# Patient Record
Sex: Female | Born: 1955
Health system: Southern US, Community
[De-identification: ages and names within clinical notes are randomized; demographics above are authoritative.]

## PROBLEM LIST (undated history)

## (undated) DIAGNOSIS — G589 Mononeuropathy, unspecified: Secondary | ICD-10-CM

## (undated) DIAGNOSIS — K859 Acute pancreatitis without necrosis or infection, unspecified: Secondary | ICD-10-CM

## (undated) DIAGNOSIS — K469 Unspecified abdominal hernia without obstruction or gangrene: Secondary | ICD-10-CM

## (undated) DIAGNOSIS — R011 Cardiac murmur, unspecified: Secondary | ICD-10-CM

## (undated) DIAGNOSIS — I272 Pulmonary hypertension, unspecified: Secondary | ICD-10-CM

## (undated) DIAGNOSIS — M1990 Unspecified osteoarthritis, unspecified site: Secondary | ICD-10-CM

## (undated) DIAGNOSIS — R112 Nausea with vomiting, unspecified: Secondary | ICD-10-CM

## (undated) DIAGNOSIS — T8859XA Other complications of anesthesia, initial encounter: Secondary | ICD-10-CM

## (undated) DIAGNOSIS — G4730 Sleep apnea, unspecified: Secondary | ICD-10-CM

## (undated) DIAGNOSIS — J42 Unspecified chronic bronchitis: Secondary | ICD-10-CM

## (undated) DIAGNOSIS — IMO0001 Reserved for inherently not codable concepts without codable children: Secondary | ICD-10-CM

## (undated) DIAGNOSIS — I10 Essential (primary) hypertension: Secondary | ICD-10-CM

## (undated) DIAGNOSIS — D649 Anemia, unspecified: Secondary | ICD-10-CM

## (undated) DIAGNOSIS — S149XXA Injury of unspecified nerves of neck, initial encounter: Secondary | ICD-10-CM

## (undated) DIAGNOSIS — I00 Rheumatic fever without heart involvement: Secondary | ICD-10-CM

## (undated) DIAGNOSIS — K219 Gastro-esophageal reflux disease without esophagitis: Secondary | ICD-10-CM

## (undated) DIAGNOSIS — R51 Headache: Secondary | ICD-10-CM

## (undated) DIAGNOSIS — I2510 Atherosclerotic heart disease of native coronary artery without angina pectoris: Secondary | ICD-10-CM

## (undated) DIAGNOSIS — G629 Polyneuropathy, unspecified: Secondary | ICD-10-CM

## (undated) DIAGNOSIS — R569 Unspecified convulsions: Secondary | ICD-10-CM

## (undated) DIAGNOSIS — H409 Unspecified glaucoma: Secondary | ICD-10-CM

## (undated) DIAGNOSIS — K7460 Unspecified cirrhosis of liver: Secondary | ICD-10-CM

## (undated) DIAGNOSIS — R0602 Shortness of breath: Secondary | ICD-10-CM

## (undated) DIAGNOSIS — F32A Depression, unspecified: Secondary | ICD-10-CM

## (undated) DIAGNOSIS — F329 Major depressive disorder, single episode, unspecified: Secondary | ICD-10-CM

## (undated) DIAGNOSIS — M4800 Spinal stenosis, site unspecified: Secondary | ICD-10-CM

## (undated) DIAGNOSIS — G8929 Other chronic pain: Secondary | ICD-10-CM

## (undated) DIAGNOSIS — K589 Irritable bowel syndrome without diarrhea: Secondary | ICD-10-CM

## (undated) DIAGNOSIS — E119 Type 2 diabetes mellitus without complications: Secondary | ICD-10-CM

## (undated) DIAGNOSIS — J45909 Unspecified asthma, uncomplicated: Secondary | ICD-10-CM

## (undated) DIAGNOSIS — G4733 Obstructive sleep apnea (adult) (pediatric): Secondary | ICD-10-CM

## (undated) DIAGNOSIS — Z9889 Other specified postprocedural states: Secondary | ICD-10-CM

## (undated) DIAGNOSIS — I5032 Chronic diastolic (congestive) heart failure: Secondary | ICD-10-CM

## (undated) DIAGNOSIS — M549 Dorsalgia, unspecified: Secondary | ICD-10-CM

## (undated) DIAGNOSIS — Z794 Long term (current) use of insulin: Secondary | ICD-10-CM

## (undated) DIAGNOSIS — T4145XA Adverse effect of unspecified anesthetic, initial encounter: Secondary | ICD-10-CM

## (undated) DIAGNOSIS — E039 Hypothyroidism, unspecified: Secondary | ICD-10-CM

## (undated) DIAGNOSIS — K760 Fatty (change of) liver, not elsewhere classified: Secondary | ICD-10-CM

## (undated) DIAGNOSIS — Z8719 Personal history of other diseases of the digestive system: Secondary | ICD-10-CM

## (undated) DIAGNOSIS — J449 Chronic obstructive pulmonary disease, unspecified: Secondary | ICD-10-CM

## (undated) DIAGNOSIS — I639 Cerebral infarction, unspecified: Secondary | ICD-10-CM

## (undated) DIAGNOSIS — I509 Heart failure, unspecified: Secondary | ICD-10-CM

## (undated) DIAGNOSIS — J189 Pneumonia, unspecified organism: Secondary | ICD-10-CM

## (undated) DIAGNOSIS — M797 Fibromyalgia: Secondary | ICD-10-CM

## (undated) HISTORY — DX: Chronic diastolic (congestive) heart failure: I50.32

## (undated) HISTORY — PX: HEEL SPUR SURGERY: SHX665

## (undated) HISTORY — PX: TUBAL LIGATION: SHX77

## (undated) HISTORY — PX: BACK SURGERY: SHX140

## (undated) HISTORY — DX: Atherosclerotic heart disease of native coronary artery without angina pectoris: I25.10

## (undated) HISTORY — PX: BREAST CYST EXCISION: SHX579

## (undated) HISTORY — PX: SPHINCTEROTOMY: SHX5279

## (undated) HISTORY — PX: CARDIAC CATHETERIZATION: SHX172

## (undated) HISTORY — DX: Reserved for inherently not codable concepts without codable children: IMO0001

## (undated) HISTORY — PX: KNEE ARTHROSCOPY: SUR90

## (undated) HISTORY — PX: CYST EXCISION: SHX5701

## (undated) HISTORY — DX: Obstructive sleep apnea (adult) (pediatric): G47.33

## (undated) HISTORY — PX: BARIATRIC SURGERY: SHX1103

## (undated) HISTORY — PX: EYE SURGERY: SHX253

## (undated) HISTORY — PX: EXCISIONAL HEMORRHOIDECTOMY: SHX1541

## (undated) HISTORY — PX: HEMORRHOID SURGERY: SHX153

## (undated) HISTORY — DX: Type 2 diabetes mellitus without complications: E11.9

## (undated) HISTORY — DX: Gastro-esophageal reflux disease without esophagitis: K21.9

## (undated) HISTORY — PX: HERNIA REPAIR: SHX51

## (undated) HISTORY — PX: BILATERAL KNEE ARTHROSCOPY: SUR91

## (undated) HISTORY — PX: CATARACT EXTRACTION W/ INTRAOCULAR LENS  IMPLANT, BILATERAL: SHX1307

## (undated) HISTORY — PX: CHOLECYSTECTOMY: SHX55

## (undated) HISTORY — DX: Long term (current) use of insulin: Z79.4

## (undated) HISTORY — PX: ABDOMINAL HYSTERECTOMY: SHX81

## (undated) HISTORY — DX: Essential (primary) hypertension: I10

## (undated) HISTORY — DX: Morbid (severe) obesity due to excess calories: E66.01

## (undated) HISTORY — PX: APPENDECTOMY: SHX54

---
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
## 1979-11-23 HISTORY — PX: PARTIAL HYSTERECTOMY: SHX80

## 1990-11-22 HISTORY — PX: OTHER SURGICAL HISTORY: SHX169

## 1992-11-22 HISTORY — PX: CHOLECYSTECTOMY: SHX55

## 2001-11-22 HISTORY — PX: OTHER SURGICAL HISTORY: SHX169

## 2003-11-23 HISTORY — PX: OTHER SURGICAL HISTORY: SHX169

## 2007-04-22 ENCOUNTER — Encounter: Payer: Self-pay | Admitting: Cardiology

## 2007-04-26 ENCOUNTER — Ambulatory Visit: Payer: Self-pay | Admitting: Cardiology

## 2007-04-28 ENCOUNTER — Inpatient Hospital Stay (HOSPITAL_COMMUNITY): Admission: EM | Admit: 2007-04-28 | Discharge: 2007-04-29 | Payer: Self-pay | Admitting: Emergency Medicine

## 2007-04-28 ENCOUNTER — Ambulatory Visit: Payer: Self-pay | Admitting: Cardiology

## 2007-04-28 ENCOUNTER — Encounter: Payer: Self-pay | Admitting: Cardiology

## 2007-04-29 ENCOUNTER — Encounter: Payer: Self-pay | Admitting: Physician Assistant

## 2007-05-18 ENCOUNTER — Ambulatory Visit: Payer: Self-pay | Admitting: Cardiology

## 2007-12-06 ENCOUNTER — Ambulatory Visit (HOSPITAL_COMMUNITY): Admission: RE | Admit: 2007-12-06 | Discharge: 2007-12-06 | Payer: Self-pay | Admitting: Internal Medicine

## 2008-11-07 ENCOUNTER — Encounter: Payer: Self-pay | Admitting: Cardiology

## 2009-04-04 ENCOUNTER — Encounter: Payer: Self-pay | Admitting: Cardiology

## 2009-04-11 ENCOUNTER — Encounter: Payer: Self-pay | Admitting: Cardiology

## 2009-04-11 ENCOUNTER — Ambulatory Visit: Payer: Self-pay | Admitting: Cardiology

## 2009-04-18 ENCOUNTER — Ambulatory Visit: Payer: Self-pay | Admitting: Cardiology

## 2009-04-28 ENCOUNTER — Ambulatory Visit: Payer: Self-pay | Admitting: Cardiology

## 2009-06-23 DIAGNOSIS — E782 Mixed hyperlipidemia: Secondary | ICD-10-CM | POA: Insufficient documentation

## 2009-06-23 DIAGNOSIS — E663 Overweight: Secondary | ICD-10-CM | POA: Insufficient documentation

## 2009-06-23 DIAGNOSIS — I10 Essential (primary) hypertension: Secondary | ICD-10-CM | POA: Insufficient documentation

## 2009-06-23 DIAGNOSIS — R079 Chest pain, unspecified: Secondary | ICD-10-CM

## 2010-02-07 ENCOUNTER — Ambulatory Visit: Payer: Self-pay | Admitting: Cardiology

## 2010-06-14 ENCOUNTER — Ambulatory Visit: Payer: Self-pay | Admitting: Cardiology

## 2010-07-13 ENCOUNTER — Ambulatory Visit: Payer: Self-pay | Admitting: Cardiology

## 2010-07-15 ENCOUNTER — Telehealth (INDEPENDENT_AMBULATORY_CARE_PROVIDER_SITE_OTHER): Payer: Self-pay | Admitting: *Deleted

## 2010-08-12 ENCOUNTER — Ambulatory Visit: Payer: Self-pay | Admitting: Physician Assistant

## 2010-08-12 DIAGNOSIS — E119 Type 2 diabetes mellitus without complications: Secondary | ICD-10-CM

## 2010-08-12 DIAGNOSIS — I5032 Chronic diastolic (congestive) heart failure: Secondary | ICD-10-CM

## 2010-08-12 DIAGNOSIS — I2510 Atherosclerotic heart disease of native coronary artery without angina pectoris: Secondary | ICD-10-CM | POA: Insufficient documentation

## 2010-08-13 ENCOUNTER — Encounter: Payer: Self-pay | Admitting: Physician Assistant

## 2010-08-19 ENCOUNTER — Encounter: Payer: Self-pay | Admitting: Physician Assistant

## 2010-08-21 ENCOUNTER — Encounter: Payer: Self-pay | Admitting: Cardiology

## 2010-09-18 ENCOUNTER — Encounter (INDEPENDENT_AMBULATORY_CARE_PROVIDER_SITE_OTHER): Payer: Self-pay | Admitting: *Deleted

## 2010-09-22 ENCOUNTER — Encounter: Payer: Self-pay | Admitting: Cardiology

## 2010-09-26 ENCOUNTER — Encounter: Payer: Self-pay | Admitting: Physician Assistant

## 2010-10-20 ENCOUNTER — Ambulatory Visit: Payer: Self-pay | Admitting: Physician Assistant

## 2010-10-23 ENCOUNTER — Encounter (INDEPENDENT_AMBULATORY_CARE_PROVIDER_SITE_OTHER): Payer: Self-pay | Admitting: *Deleted

## 2010-10-28 ENCOUNTER — Encounter: Payer: Self-pay | Admitting: Physician Assistant

## 2010-10-30 ENCOUNTER — Encounter: Payer: Self-pay | Admitting: Physician Assistant

## 2010-11-04 ENCOUNTER — Encounter: Payer: Self-pay | Admitting: Cardiology

## 2010-11-06 ENCOUNTER — Encounter (INDEPENDENT_AMBULATORY_CARE_PROVIDER_SITE_OTHER): Payer: Self-pay | Admitting: *Deleted

## 2010-12-13 ENCOUNTER — Encounter: Payer: Self-pay | Admitting: Interventional Radiology

## 2010-12-14 ENCOUNTER — Ambulatory Visit: Admit: 2010-12-14 | Payer: Self-pay | Admitting: Cardiology

## 2010-12-17 ENCOUNTER — Telehealth (INDEPENDENT_AMBULATORY_CARE_PROVIDER_SITE_OTHER): Payer: Self-pay | Admitting: *Deleted

## 2010-12-23 NOTE — Letter (Signed)
Summary: Engineer, materials at [facility]  [address]   [place], [place] [address]   Phone: [phone]  Fax: [phone]        September 18, 2010 MRN: [id number]   [facility] [place] [address] [place], [place]  [address]   Dear Ms. Goering,  Your test ordered by Selena Batten has been reviewed by your physician (or physician assistant) and was found to be abnormal.  Please contact our office at your earliest convience to discuss your results.  I have left several messages for a return call, but have not received any to date.    ____ Echocardiogram  ____ Cardiac Stress Test  __X__ Lab Work  ____ Peripheral vascular study of arms, legs or neck  ____ CT scan or X-ray  ____ Lung or Breathing test  ____ Other:   Thank you.   Hoover Brunette, LPN    Duane Boston, M.D., F.A.C.C. Thressa Sheller, M.D., F.A.C.C. Oneal Grout, M.D., F.A.C.C. Cheree Ditto, M.D., F.A.C.C. Daiva Nakayama, M.D., F.A.C.C. Kenney Houseman, M.D., F.A.C.C. Jeanne Ivan, PA-C  Appended Document: Jonita Albee Results Letters Patient has left message in your voicemail and called again this am to say call her home phone first then cell. if not able to call today she will be at [facility] working.  She works in [employer]  [phone]

## 2010-12-23 NOTE — Cardiovascular Report (Signed)
Summary: Cardiac Catheterization  Cardiac Catheterization   Imported By: Dorise Hiss 08/11/2010 17:01:07  _____________________________________________________________________  External Attachment:    Type:   Image     Comment:   External Document

## 2010-12-23 NOTE — Assessment & Plan Note (Signed)
Summary: EPH -POST [facility] 8/23   Visit Type:  hospital follow-up Primary Provider:  Sherril Croon   History of Present Illness: Kimberly [name] presents for posthospital followup.  She was recently rehospitalized with chest pain syndrome, ruled out for MI, and had had a normal dobutamine stress echocardiogram, in July. Her symptoms were felt to be extremely atypical, but we added low-dose Imdur for treatment of probable microvascular disease. She was also placed on low dose furosemide, for treatment of diastolic heart failure.  Clinically, she feels somewhat better, with less exertional dyspnea.   Preventive Screening-Counseling & Management  Alcohol-Tobacco     Smoking Status: never  Current Medications (verified): 1)  Furosemide 20 Mg Tabs (Furosemide) .... Take 1 Tablet By Mouth Once A Day 2)  Levothyroxine Sodium 50 Mcg Tabs (Levothyroxine Sodium) .... Take 1 Tablet By Mouth Once A Day 3)  Losartan Potassium-Hctz 100-12.5 Mg Tabs (Losartan Potassium-Hctz) .... Take 1 Tablet By Mouth Once A Day 4)  Meloxicam 7.5 Mg Tabs (Meloxicam) .... Take 1 Tablet By Mouth Two Times A Day 5)  Glyburide-Metformin 5-500 Mg Tabs (Glyburide-Metformin) .... Take 1 Tablet By Mouth Two Times A Day 6)  Advair Diskus 250-50 Mcg/dose Aepb (Fluticasone-Salmeterol) .... One Inhalation Two Times A Day 7)  Protonix 40 Mg Solr (Pantoprazole Sodium) .... Take 1 Tablet By Mouth Two Times A Day 8)  Dicyclomine Hcl 10 Mg Caps (Dicyclomine Hcl) .... Take 1 Tablet By Mouth Four Times A Day 9)  Proair Hfa 108 (90 Base) Mcg/act Aers (Albuterol Sulfate) .... 2 Puffs Four Times A Day 10)  Novolog Flexpen 100 Unit/ml Soln (Insulin Aspart) .... Use As Directed 11)  Duoneb 0.5-2.5 (3) Mg/70ml Soln (Ipratropium-Albuterol) .... One Neb Tx Four Times A Day 12)  Carafate 1 Gm/26ml Susp (Sucralfate) .... 2 Tsp By Mouth Four Times A Day As Needed 13)  Isosorbide Mononitrate Cr 30 Mg Xr24h-Tab (Isosorbide Mononitrate) .... Take 1 Tablet By  Mouth Once A Day 14)  Vicodin 5-500 Mg Tabs (Hydrocodone-Acetaminophen) .... As Needed 15)  Cyclobenzaprine Hcl 10 Mg Tabs (Cyclobenzaprine Hcl) .... As Needed 16)  Cpap .... Use As Directed 17)  Oxygen 2l .... Use As Directed With Cpap  Allergies (verified): 1)  ! Sulfa 2)  ! Naprosyn 3)  ! * Anesthsia  Comments:  Nurse/Medical Assistant: The Kimberly [name]'s medication list and allergies were reviewed with the Kimberly [name] and were updated in the Medication and Allergy Lists.  Past History:  Past Medical History: CAD, nonobstructive by catheterization, 6/08 (false positive Cardiolite)...normal stress echo, 7/11 Chronic diastolic heart failure IDDM Hypertension Obstructive sleep apnea Morbid obesity GERD  Review of Systems       No fevers, chills, hemoptysis, dysphagia, melena, hematocheezia, hematuria, rash, claudication, orthopnea, or pnd. He complains of worsening lower extremity and pedal edema. All other systems negative.   Vital Signs:  Kimberly [name] profile:   55 year old female Height:      60 inches Weight:      241 pounds BMI:     47.24 Pulse rate:   76 / minute BP sitting:   100 / 67  (left arm) Cuff size:   large  Vitals Entered By: Carlye Grippe (August 12, 2010 1:40 PM)  Nutrition Counseling: Kimberly [name]'s BMI is greater than 25 and therefore counseled on weight management options.  Physical Exam  Additional Exam:  GEN: 55 year old female, obese, no distress HEENT: NCAT,PERRLA,EOMI NECK: palpable pulses, no bruits; no JVD; no TM LUNGS: CTA bilaterally HEART: RRR (S1S2); soft, grade 2/6 systolic  ejection murmur; no rubs; no gallops ABD: soft, NT; intact BS EXT:2-3+ bilateral, nonpitting edema SKIN: warm, dry MUSC: no obvious deformity NEURO: A/O (x3)     Impression & Recommendations:  Problem # 1:  CHRONIC DIASTOLIC HEART FAILURE (ICD-428.32)  increase Lasix to 40 mg daily. Check baseline metabolic profile/BNP level today, with repeat BMET in one week. add  supplemental potassium, if needed.  Problem # 2:  CAD (ICD-414.00)  no further evaluation indicated. Kimberly [name] has chest pain syndrome, with nonobstructive CAD by catheterization in 2008, following a false-positive stress test. She had a recent, normal dobutamine stress echocardiogram, in July. Will continue management of probable microvascular disease, with recent addition of isosorbide mononitrate.  Problem # 3:  HYPERLIPIDEMIA-MIXED (ICD-272.4)  Kimberly [name] should be on a statin. However, she had elevated LFTs in recent past, with reported diagnosis of pancreatitis. We'll recheck liver enzymes and, if normalized, add a statin. She will then need followup FLP/LFTs in 12 weeks.  Problem # 4:  IDDM (ICD-250.01) Assessment: Comment Only  Other Orders: T-Basic Metabolic Panel [id number]) T-Hepatic Function [id number]) T-Lipid Profile [id number]) T-Basic Metabolic Panel [id number]) T-BNP  (B Natriuretic Peptide) ([id number])  Kimberly [name] Instructions: 1)  Increase Lasix (furosemide) to 40mg  by mouth once daily. You may take 2 of your 20 mg tablets until gone. Then get new prescription filled for 40mg  tablets. 2)  Your physician recommends that you go to the [facility] for lab work: DO TODAY. ALSO DO FASTING LAB WORK IN 1 WEEK.  3)  Your physician wants you to follow-up in: 3 months. You will receive a reminder letter in the mail one-two months in advance. If you don't receive a letter, please call our office to schedule the follow-up appointment. Prescriptions: FUROSEMIDE 40 MG TABS (FUROSEMIDE) Take one tablet by mouth daily.  #30 x 6   Entered by:   Cyril Loosen, RN, BSN   Authorized by:   Nelida Meuse, PA-C   Signed by:   Cyril Loosen, RN, BSN on 08/12/2010   Method used:   Electronically to        [facility]* (retail)       [address]       [place], [place]  [address]       Ph: [phone]       Fax: [phone]   RxID:   [id number]  I  have reviewed and approved all prescriptions at the time of the office visit. Nelida Meuse, PA-C  August 12, 2010 2:33 PM

## 2010-12-23 NOTE — Consult Note (Signed)
Summary: CARDIOLOGY CONSULT/ [facility]  CARDIOLOGY CONSULT/ [facility]   Imported By: Zachary George 10/20/2010 10:47:32  _____________________________________________________________________  External Attachment:    Type:   Image     Comment:   External Document

## 2010-12-23 NOTE — Miscellaneous (Signed)
Summary: FLP,LFT IN 12 WEEKS  Clinical Lists Changes  Orders: Added new Test order of T-Hepatic Function [phone]) - Signed Added new Test order of T-Lipid Profile [phone]) - Signed

## 2010-12-23 NOTE — Letter (Signed)
Summary: Engineer, materials at [facility]  [address]   [place], [place] [address]   Phone: [phone]  Fax: [phone]        October 23, 2010 MRN: [id number]   [facility] [place] [address] [place], [place]  [address]   Dear Ms. Revell,  Your test ordered by Selena Batten has been reviewed by your physician (or physician assistant) and was found to be normal or stable. Your physician (or physician assistant) felt no changes were needed at this time.  ____ Echocardiogram  ____ Cardiac Stress Test  __X__ Lab Work  ____ Peripheral vascular study of arms, legs or neck  ____ CT scan or X-ray  ____ Lung or Breathing test  ____ Other:   Thank you.   Hoover Brunette, LPN    Duane Boston, M.D., F.A.C.C. Thressa Sheller, M.D., F.A.C.C. Oneal Grout, M.D., F.A.C.C. Cheree Ditto, M.D., F.A.C.C. Daiva Nakayama, M.D., F.A.C.C. Kenney Houseman, M.D., F.A.C.C. Jeanne Ivan, PA-C

## 2010-12-23 NOTE — Progress Notes (Signed)
Summary: rx for Lasix   Phone Note Call from Patient Call back at [phone]   Summary of Call: Was in hosp. over weekend.  Was started on Imdur and Lasix.  Was given rx for Imdur, but not the Lasix.  Stated Lasix was helping her SOB.  Now feeling SOB again and felt like that med was helping with pressure and SOB, feeling like fluid is building again. [facility])  Patient states that she has gained a couple of pounds.  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  Will be there between 6:30 -3:00 at ext. 2210.  Did check [employer] for dictation, but d/c summary not in yet and consult not clear on Lasix.  Initial call taken by: Hoover Brunette, LPN,  July 15, 2010 4:53 PM  Follow-up for Phone Call        OK to start 20mg  Lasix by mouth qdaily.  Follow-up by: Lewayne Bunting, MD, [facility],  July 16, 2010 1:06 PM  Additional Follow-up for Phone Call Additional follow up Details #1::        Patient notified.   Will send rx to [facility]. Hoover Brunette, LPN  July 16, 2010 4:33 PM     New/Updated Medications: FUROSEMIDE 20 MG TABS (FUROSEMIDE) Take 1 tablet by mouth once a day Prescriptions: FUROSEMIDE 20 MG TABS (FUROSEMIDE) Take 1 tablet by mouth once a day  #30 x 6   Entered by:   Hoover Brunette, LPN   Authorized by:   Lewayne Bunting, MD, [facility]   Signed by:   Hoover Brunette, LPN on 45/40/9811   Method used:   Electronically to        [facility]* (retail)       [address]       [place], [place]  [address]       Ph: [phone]       Fax: [phone]   RxID:   [id number]

## 2010-12-23 NOTE — Assessment & Plan Note (Signed)
Summary: eph d/c [facility] 09-28-2010   Visit Type:  Follow-up Primary Provider:  Sherril Croon   History of Present Illness: patient presents for post hospital followup, following recent brief stay here at [facility] for chest pain.  Serial cardiac markers were negative. Imdur was increased to 60 q.d., and low-dose K was added.   Clinically, she reports improvement in her chest discomfort, and attributes this to the increased dose of isosorbide. However, she is now complaining of swelling of her hands and feet, and a "smothering" sensation. Of note, she does have OSA, and is compliant with CPAP. She also complains of chronic fatigue, right leg cramps, and constant back pain.  Preventive Screening-Counseling & Management  Alcohol-Tobacco     Smoking Status: never  Current Medications (verified): 1)  Furosemide 40 Mg Tabs (Furosemide) .... Take One Tablet By Mouth Daily. 2)  Levothyroxine Sodium 75 Mcg Tabs (Levothyroxine Sodium) .... Take 1 Tablet By Mouth Once A Day 3)  Losartan Potassium-Hctz 100-12.5 Mg Tabs (Losartan Potassium-Hctz) .... Take 1 Tablet By Mouth Once A Day 4)  Meloxicam 7.5 Mg Tabs (Meloxicam) .... Take 1 Tablet By Mouth Two Times A Day 5)  Glyburide-Metformin 5-500 Mg Tabs (Glyburide-Metformin) .... Take 1 Tablet By Mouth Two Times A Day 6)  Advair Diskus 500-50 Mcg/dose Aepb (Fluticasone-Salmeterol) .... One Inhalation Two Times A Day 7)  Protonix 40 Mg Solr (Pantoprazole Sodium) .... Take 1 Tablet By Mouth Two Times A Day 8)  Xopenex Hfa 45 Mcg/act Aero (Levalbuterol Tartrate) .... 2 Puffs Four Times A  Day As Needed 9)  Novolog Flexpen 100 Unit/ml Soln (Insulin Aspart) .... Use As Directed 10)  Duoneb 0.5-2.5 (3) Mg/24ml Soln (Ipratropium-Albuterol) .... One Neb Tx Four Times A Day 11)  Isosorbide Mononitrate Cr 60 Mg Xr24h-Tab (Isosorbide Mononitrate) .... Take 1 Tablet By Mouth Once A Day 12)  Vicodin 5-500 Mg Tabs (Hydrocodone-Acetaminophen) .... As Needed 13)  Cpap .... Use As  Directed 14)  Oxygen 2l .... Use As Directed With Cpap 15)  Simvastatin 40 Mg Tabs (Simvastatin) .... Take 1 Tablet By Mouth Once A Day 16)  Aspir-Low 81 Mg Tbec (Aspirin) .... Take 1 Tablet By Mouth Once A Day 17)  Potassium Chloride Cr 10 Meq Cr-Caps (Potassium Chloride) .... Take 1 Tablet By Mouth Once A Day  Allergies (verified): 1)  ! Sulfa 2)  ! Naprosyn 3)  ! * Anesthsia  Comments:  Nurse/Medical Assistant: The patient's medication bottles and allergies were reviewed with the patient and were updated in the Medication and Allergy Lists.  Past History:  Past Medical History: Last updated: 08/12/2010 CAD, nonobstructive by catheterization, 6/08 (false positive Cardiolite)...normal stress echo, 7/11 Chronic diastolic heart failure IDDM Hypertension Obstructive sleep apnea Morbid obesity GERD  Review of Systems       No fevers, chills, hemoptysis, dysphagia, melena, hematocheezia, hematuria, rash, claudication, orthopnea, pnd, pedal edema. All other systems negative.   Vital Signs:  Patient profile:   55 year old female Height:      60 inches Weight:      246 pounds Pulse rate:   68 / minute BP sitting:   123 / 78  (left arm) Cuff size:   large  Vitals Entered By: Carlye Grippe (October 20, 2010 10:41 AM)  Physical Exam  Additional Exam:  GEN: 55 year old female, obese, no distress HEENT: NCAT,PERRLA,EOMI NECK: palpable pulses, no bruits; no JVD; no TM LUNGS: CTA bilaterally HEART: RRR (S1S2); soft, grade 2/6 systolic ejection murmur; no rubs; no gallops ABD: soft,  NT; intact BS EXT:2-3+ bilateral, nonpitting edema SKIN: warm, dry MUSC: no obvious deformity NEURO: A/O (x3)     Impression & Recommendations:  Problem # 1:  CHRONIC DIASTOLIC HEART FAILURE (ICD-428.32)  patient has gained 5 pounds since last OV. We'll increase furosemide to 40 b.i.d., and supplemental potassium to 10 b.i.d. Will check metabolic profile/BNP level today, and repeat BMET  in one week  Problem # 2:  CAD (ICD-414.00)  no further workup indicated. Recent hospitalization for chest pain syndrome, with negative markers, and a negative Cardiolite, 7/11. Continue treatment for possible microvascular angina, in context of diabetes mellitus  Problem # 3:  IDDM (ICD-250.01) Assessment: Comment Only  Her updated medication list for this problem includes:    Losartan Potassium-hctz 100-12.5 Mg Tabs (Losartan potassium-hctz) .Marland Kitchen... Take 1 tablet by mouth once a day    Glyburide-metformin 5-500 Mg Tabs (Glyburide-metformin) .Marland Kitchen... Take 1 tablet by mouth two times a day    Novolog Flexpen 100 Unit/ml Soln (Insulin aspart) ..... Use as directed    Aspir-low 81 Mg Tbec (Aspirin) .Marland Kitchen... Take 1 tablet by mouth once a day  Problem # 4:  HYPERLIPIDEMIA-MIXED (ICD-272.4)  recently started on simvastatin. We'll reassess lipid status with FLP next month  Her updated medication list for this problem includes:    Simvastatin 40 Mg Tabs (Simvastatin) .Marland Kitchen... Take 1 tablet by mouth once a day  Future Orders: T-Lipid Profile ([id number]) ... 11/23/2010 T-Hepatic Function [id number]) ... 11/23/2010  Other Orders: T-Basic Metabolic Panel [phone] [id number]) T-BNP  (B Natriuretic Peptide) [id number]) T-Basic Metabolic Panel [id number])  Patient Instructions: 1)  Follow up with Dr. Earnestine Leys on Monday, December 14, 2010 at 11am. 2)  increase Lasix (furosemide) to 40mg  by mouth two times a day. 3)  Increase potassium to two times a day. 4)  Do labwork today (BMET/BNP) and in 1 week (BMET). 5)  Your physician recommends that you go to the [facility] for a FASTING lipid profile and liver function labs:  Do not eat or drink after midnight. DO IN JANUARY 2012. TAKE ORDER WITH YOU. Prescriptions: POTASSIUM CHLORIDE CR 10 MEQ CR-CAPS (POTASSIUM CHLORIDE) Take 1 tablet by mouth two times a day  #60 x 6   Entered by:   Cyril Loosen, RN, BSN   Authorized by:   Nelida Meuse,  PA-C   Signed by:   Cyril Loosen, RN, BSN on 10/20/2010   Method used:   Electronically to        [facility]* (retail)       [address]       [place], [place]  [address]       Ph: [phone]       Fax: [phone]   RxID:   [id number] FUROSEMIDE 40 MG TABS (FUROSEMIDE) Take one tablet by mouth two times a day.  #60 x 6   Entered by:   Cyril Loosen, RN, BSN   Authorized by:   Nelida Meuse, PA-C   Signed by:   Cyril Loosen, RN, BSN on 10/20/2010   Method used:   Electronically to        [facility]* (retail)       [address]       [place], [place]  [address]       Ph: [phone]       Fax: [phone]   RxID:   [id number]  I  have reviewed and approved all prescriptions at the time of the office visit. Nelida Meuse, PA-C  October 20, 2010 11:12 AM

## 2010-12-23 NOTE — Miscellaneous (Signed)
Summary: Rehab Report/ CARDIAC REHAB PROGRESS REPORT  Rehab Report/ CARDIAC REHAB PROGRESS REPORT   Imported By: Bartholomew Boards 09/24/2010 09:22:40  _____________________________________________________________________  External Attachment:    Type:   Image     Comment:   External Document

## 2010-12-24 NOTE — Letter (Signed)
Summary: Risk analyst at [facility]. [address]   [place], [place] [address]   Phone: [phone]  Fax: [phone]        November 06, 2010 MRN: [id number]   [place], [place]  [address]   Dear Ms. Crace,  Your test ordered by Rande Lawman has been reviewed by your physician (or physician assistant) and was found to be normal or stable. Your physician (or physician assistant) felt no changes were needed at this time.  ____ Echocardiogram  ____ Cardiac Stress Test  __X__ Lab Work  ____ Peripheral vascular study of arms, legs or neck  ____ CT scan or X-ray  ____ Lung or Breathing test  ____ Other:   Thank you.   Lovina Reach, LPN    Bryon Lions, M.D., F.A.C.C. Maceo Pro, M.D., F.A.C.C. Cammy Copa, M.D., F.A.C.C. Vonda Antigua, M.D., F.A.C.C. Vita Barley, M.D., F.A.C.C. Mare Ferrari, M.D., F.A.C.C. Emi Belfast, PA-C

## 2010-12-24 NOTE — Miscellaneous (Signed)
Summary: Rehab Report/ CARDIAC REHAB PROGRESS REPORT  Rehab Report/ CARDIAC REHAB PROGRESS REPORT   Imported By: Bartholomew Boards 11/10/2010 15:57:07  _____________________________________________________________________  External Attachment:    Type:   Image     Comment:   External Document

## 2010-12-24 NOTE — Progress Notes (Signed)
  Phone Note Other Incoming   Request: Send information Summary of Call: Request for records received from [facility]. Request forwarded to [facility].     

## 2011-03-04 ENCOUNTER — Encounter: Payer: Self-pay | Admitting: *Deleted

## 2011-03-23 ENCOUNTER — Telehealth: Payer: Self-pay | Admitting: *Deleted

## 2011-03-23 NOTE — Telephone Encounter (Signed)
Received reminder letter in mail for FLP / LFT.  States recently in hospital in Feb. 2012.  No insurance and out of work.  Checked [facility] system & no lipid panel done at that time.

## 2011-04-06 NOTE — Assessment & Plan Note (Signed)
[place] OFFICE NOTE   Kimberly, [name]              MRN:          [id number]  DATE:04/28/2007                            DOB:          Mar 04, 1956    REFERRING PHYSICIAN:  Dr. Liane Comber   REASON FOR CONSULTATION:  Unstable angina.   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  The patient was admitted on  Saturday to [facility] with substernal chest pain.  The patient  stated that her pain started at 10 p.m. the night prior and lasted for  approximately an hour.  She also had some pain radiating into the right  neck area and left arm.  She did have some shortness of breath.  She was  ruled out for myocardial infarction, and she was given a Medrol dose  pack.  It was felt that she had chest wall pain.  She then called back  on Monday to Dr. Liane Comber.  Due to ongoing pain, she was referred  subsequently for a Cardiolite imaging study which was interpreted by me  which showed that the perfusion data were positive for ischemia with an  anterior apical defect.  Dr. Liane Comber called me yesterday, and I arranged  for the patient to be seen as soon as possible in the office this  morning.   The patient reports to me that she has had off and on pain since last  Saturday.  In particular, exertion causes her to have a tightness in the  chest which usually lasts a few minutes and abates when resting or  sitting down.  She did receive nitroglycerin from Dr. Liane Comber last night  and has actually taken the nitro on 2 occasions, each time with prompt  relief of her substernal chest pressure.  She states that she has  associated shortness of breath and diaphoresis and some radiation into  the left arm.  Just this morning walking through the parking lot to come  to our office, she actually had an episode of substernal chest pain  which then resolved  in the waiting area.  An EKG as of this dictation is  pending.   ALLERGIES:  1. FELDENE CAUSES NAUSEA, NAPROSYN CAUSES NAUSEA, NONSTEROIDAL ANTI-      INFLAMMATORY DRUGS CAUSE NAUSEA AND THROAT CLOSES.  2. SULFA YEAST INFECTION.  3. ZELNORM DIARRHEA.   FAMILY HISTORY:  Strong family history of coronary artery disease with a  brother with CAD at an early age.  Also type 2 diabetes mellitus in  family members.  Father died at age 27 from heart disease.  Mother also  had a myocardial infarction at the age of 4.   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  She  does not smoke or drink.   PAST MEDICAL HISTORY:  1. History of carpal tunnel syndrome.  2. History of esophageal reflux.  3. Hypertension.  4. Headache.  5. Hyperlipidemia.  6. Sleep apnea, documented and treated by Dr. Felton Clinton.  7. Insomnia.  8. Obesity.  9. History of cholecystectomy and abdominal  hysterectomy.  10.Hemorrhoidectomy.  11.History of colonoscopy in 2002 with followup in 2006.   REVIEW OF SYSTEMS:  As per HPI.  No nausea or vomiting, no fever or  chills, no melena, hematochezia.  No dysuria or frequency.  No  palpitations or syncope.  Positive for reflux.  Positive for chest pain  as outlined above.  Remainder of review of systems is noncontributory.   PHYSICAL EXAMINATION:  VITAL SIGNS:  Blood pressure is 144/87; heart  rate is 48 beats per minute, weight is 215 pounds.  GENERAL:  Obese white female but in no apparent distress.  HEENT:  Normal.  NECK:  Normal.  Carotid upstroke and no carotid bruits.  JVD is 6 cm.  LUNGS:  Clear bilaterally.  HEART:  Regular rate and rhythm with normal S1 and S2 and no murmur,  rubs, or gallop.  ABDOMEN:  Soft, nontender, no rebound or guarding.  Good bowel sounds.  EXTREMITIES:  No cyanosis, clubbing, or edema.  NEUROLOGIC:  Patient alert, oriented, and grossly nonfocal.   PROBLEM LIST:  1. Unstable angina.  2. Abnormal Cardiolite study  with anterior ischemia.  3. Preserved ejection fraction.  4. Multiple cardiac risk factors.  5. Multiple allergies.  See details above.   PLAN:  1. The patient clearly has symptoms of unstable angina.  Her pain is      responsive to nitroglycerin.  She is a high risk patient with a      positive Cardiolite study.  2. I have told the patient that I cannot send her home from the office      and that she needs to be admitted to [facility] and needs      to be scheduled as soon as possible for a cardiac catheterization.      We will admit the patient, place her on aspirin and Plavix as well      as Lovenox.  We will hold off on beta blocker due to her low      resting and baseline heart rate.  She will get nitro paste and      p.r.n. nitroglycerin.  3. I have discussed at length with the patient her need to be      hospitalized, and she is in agreement, and she will be transferred      per EMS this morning.     Ernestine Mcmurray, MD,FACC  Electronically Signed    GED/MedQ  DD: 04/28/2007  DT: 04/28/2007  Job #: [id number]   cc:   Carin Primrose

## 2011-04-06 NOTE — Assessment & Plan Note (Signed)
[place]                          [facility] OFFICE NOTE   NAME:Kimberly [name], Kimberly [name]                    MRN:          [id number]  DATE:04/28/2009                            DOB:          1956-03-18    PRIMARY CARDIOLOGIST:  Ernestine Mcmurray, MD,FACC   REASON FOR VISIT:  Scheduled followup.   Kimberly [name] returns to our clinic in followup of recent diagnostic  studies, ordered by Dr. Domenic Polite, for further evaluation of shortness of  breath and chest discomfort.   Kimberly [name] was previously seen in our clinic in 2008, at which time she  was referred for a cardiac catheterization, by Dr. Dannielle Burn, for further  evaluation of chest pain in the setting of multiple cardiac risk  factors.  Moreover, she had had an abnormal Cardiolite suggestive of  anterior ischemia.  Cardiac catheterization, however, suggested that the  stress test was a false positive, indicating no significant CAD and  preserved LVF (EF 55%), with no focal wall motion abnormalities.   The current studies were a dobutamine stress Cardiolite, which induced a  90% PMHR, and during which the patient did experience chest pain.  Perfusion imaging, however, indicated no large reversible defect  suggestive of ischemia; EF 70% and normal TID ratio of 0.95.   A 2-D echocardiogram was also performed, indicating normal LVF (EF 65%)  with mild/moderate concentric LVH.  No significant valvular  abnormalities.   Kimberly [name] complains of chest pain which is very atypical, essentially  occurring on a daily basis, and exacerbated both by palpation and  inspiration.  She states that these symptoms have been ongoing for the  past year.   CURRENT MEDICATIONS:  Unchanged from recent visit.   CURRENT MEDICATIONS:  1. Levothyroxine 0.05 mg daily.  2. Amlodipine 10 mg daily.  3. Meloxicam 15 mg daily.  4. Zocor 20 mg daily.  5. Amitriptyline 25 mg daily.  6. Metformin 500 mg t.i.d.  7. Citalopram 20  mg daily.  8. Metoprolol 25 mg b.i.d.  9. Furosemide 20 mg daily.  10.Advair 1 puff b.i.d.   PHYSICAL EXAMINATION:  VITAL SIGNS:  Blood pressure 144/84, pulse 71,  regular, weight 232.  GENERAL:  A 55 year old female, obese, sitting upright, in no distress.  HEENT:  Normocephalic, atraumatic.  NECK:  Palpable carotid pulse without bruits.  LUNGS:  Clear to auscultation in all fields.  HEART:  Regular rate and rhythm.  No significant murmurs.  ABDOMEN:  Soft, nontender.  EXTREMITIES:  No significant edema.  NEURO:  Mildly anxious, but otherwise alert and oriented.   IMPRESSION:  1. Noncardiac chest pain.      a.     Suspect musculoskeletal etiology.      b.     Normal dobutamine stress Cardiolite.      c.     Nonobstructive CAD by cardiac catheterization, June 2008.      d.     False positive stress Cardiolite.  2. Normal LVF.  3. GERD.  4. Type 2 diabetes mellitus.  5. Dyslipidemia.  6. Obesity.  7. Hypertension.   PLAN:  1. No  further cardiac workup is indicated at this point in time.  The      patient presents with symptoms which are quite atypical and, in      fact, are reproducible by physical examination.  This most likely      is consistent with a musculoskeletal etiology, and we will defer to      Dr. Bobby Rumpf regarding further management.  2. Continue aggressive risk factor modification, including aggressive      lipid management with target LDL of 70 or less, if feasible.  The      patient does have type 2 diabetes mellitus and therefore has CAD      equivalent.  However, no further cardiac workup is indicated at      this point in time, and we will have the patient return to follow      up with Dr. Dannielle Burn on an as-needed basis.      Gene Serpe, PA-C  Electronically Signed      Ernestine Mcmurray, MD,FACC  Electronically Signed   GS/MedQ  DD: 04/28/2009  DT: 04/29/2009  Job #: [id number]   cc:   Celedonio Savage, MD

## 2011-04-06 NOTE — Cardiovascular Report (Signed)
NAMEDALEY, MOORADIAN             ACCOUNT NO.:  [id number]   MEDICAL RECORD NO.:  [id number]          PATIENT TYPE:  INP   LOCATION:  2807                         FACILITY:  [place]   PHYSICIAN:  Satira Sark, MD DATE OF BIRTH:  1956-09-13   DATE OF PROCEDURE:  04/28/2007  DATE OF DISCHARGE:                            CARDIAC CATHETERIZATION   PRIMARY CARE PHYSICIAN:  Dr. Carin Primrose.   INDICATIONS:  Kimberly [name] is a pleasant 55 year old woman with a  history of hypertension, gastroesophageal reflux disease on proton pump  inhibitor, hyperlipidemia, obstructive sleep apnea, and recent chest  discomfort.  She was observed at [facility] and ruled  out for myocardial infarction recently, undergoing a Cardiolite stress  test on June 4 indicating evidence of small to moderate anteroapical  ischemia with an ejection fraction of 62%.  She was seen in the office  earlier today by Dr. Dannielle Burn and is now transferred to [facility] for a diagnostic cardiac catheterization to clearly assess the  coronary anatomy.  The potential risks and benefits were explained to  her in advance and informed consent was obtained.   PROCEDURES PERFORMED:  1. Left heart catheterization.  2. Selective coronary angiography.  3. Ventriculography.   ACCESS AND EQUIPMENT:  The area about the right femoral artery was  anesthetized with 1% lidocaine and a 6-French sheath was placed in the  right femoral artery via modified Seldinger technique.  Standard  preformed 6-French JL-4 and JR-4 catheters were used for selective  coronary angiography and an angled pigtail catheter was used for left  heart catheterization and left ventriculography.  A total of 60 mL  Omnipaque were used.  All exchanges were made over wire.  The patient  tolerated the procedure well without any complications.   HEMODYNAMIC RESULTS:  Aorta 141/77 mmHg.  Left ventricle 141/16 mmHg.   ANGIOGRAPHIC FINDINGS:  1.  Left main coronary artery is medium in caliber and gives rise to      left anterior descending, ramus intermedius and circumflex vessels.      There is no significant flow-limiting coronary atherosclerosis      noted.  2. The left anterior descending is a medium caliber vessel that tapers      towards the apex.  This vessel gives rise to a fairly large      proximal diagonal branch and a smaller distal diagonal branch.      Flow was TIMI III throughout the vessel and is no significant flow-      limiting coronary atherosclerosis noted.  3. There is a small to medium caliber ramus intermedius without      significant flow-limiting coronary atherosclerosis.  4. The circumflex coronary artery is a medium to large caliber vessel      with four obtuse marginal branches, the third of which is the      largest and bifurcates.  There are minor luminal irregularities      without flow-limiting coronary atherosclerosis.  5. The right coronary artery is a medium caliber dominant vessel.      There are minor luminal irregularities without  significant flow-      limiting coronary sclerosis.  6. Left ventriculography was performed in the RAO projection and      revealed ejection fraction of approximately 55% without significant      wall motion abnormality or significant mitral regurgitation.   DIAGNOSES:  1. No significant flow-limiting coronary atherosclerosis noted within      major epicardial vessels.  There is some distal tapering of the      left anterior descending, although flow is TIMI III.  2. Left ventricular ejection fraction approximately 55% without      significant wall motion abnormality.  No significant mitral      regurgitation.  The left ventricle end-diastolic pressure of 16      mmHg.   DISCUSSION:  I reviewed results with the patient and with Dr. Dannielle Burn by  phone.  At this point will plan overnight observation.  Will follow up a  full set of cardiac markers as well as her  electrocardiogram.  Anticipate a contrasted CT scan of the chest tomorrow to exclude  pulmonary embolus, dissection and mass.  Continue proton pump inhibitor.  If her CT scan of the chest is reassuring, may be able to continue  medical therapy with plans for risk factor modification.  One wonders  about the possibility of microvascular angina.  Further plans to follow.      Satira Sark, MD  Electronically Signed     SGM/MEDQ  D:  04/28/2007  T:  04/29/2007  Job:  [id number]   cc:   Ernestine Mcmurray, MD,FACC  Carin Primrose

## 2011-04-06 NOTE — Discharge Summary (Signed)
Kimberly [name], Kimberly [name]             ACCOUNT NO.:  [id number]   MEDICAL RECORD NO.:  [id number]          PATIENT TYPE:  INP   LOCATION:  4705                         FACILITY:  [place]   PHYSICIAN:  Wallis Bamberg. Johnsie Cancel, MD, FACCDATE OF BIRTH:  09-03-1956   DATE OF ADMISSION:  04/28/2007  DATE OF DISCHARGE:  04/29/2007                               DISCHARGE SUMMARY   PRIMARY CARE PHYSICIAN:  Dr. Danise Mina.   CARDIOLOGIST:  Dr. Dannielle Burn.   DISCHARGING DIAGNOSES:  1. Chest pain, negative cardiac enzymes status post cardiac      catheterization this admission, showing no significant flow-      limiting coronary atherosclerosis within major epicardial vessels,      some distal tapering of LAD, EF approximately 55%.  2. Status post chest CT showing no aortic dissection, mild      cardiomegaly, fatty liver, a T12 wedge compression with minimal      endplate retropulsion of indeterminate stage, interpreted by Dr.      Bartholome Bill.   PAST MEDICAL HISTORY:  Includes:  1. Chest pain status post stress Myoview at [facility] prior to      this admission, interpreted to show a perfusion data positive for      ischemia with a anterior septal defect.  2. History of carpal tunnel syndrome.  3. History of GERD.  4. Hypertension.  5. Headaches.  6. Hyperlipidemia.  7. Obstructive sleep apnea.  8. Insomnia.  9. Obesity.  10.Status post cholecystectomy, abdominal hysterectomy and      hemorrhoidectomy.   HOSPITAL COURSE:  [name] is a 55 year old female who apparently  works in the dietary department at [employer], who was followed  by Dr. Liane Comber.  She was admitted to [facility] on the past  Saturday for substernal chest discomfort.  Her cardiac enzymes were  negative.  It was felt to be chest wall pain.  The patient was  discharged home.  She then called Dr. Liane Comber on Monday, complaining of  ongoing pain.  She was for referred for a stress Myoview, which she  underwent and was  read by Dr. Dannielle Burn.  Stress test was felt to be  positive for ischemia.  The patient was seen in our office on June 6 in  [place], with recommendations to proceed with cardiac catheterization, as  patient was having ongoing chest pain.  The patient was transferred to  [facility] for cardiac catheterization,  underwent cardiac  catheterization on April 28, 2007 by Dr. Johnny Bridge, results as  stated above.  He felt the patient needed further evaluation in the form  of chest CT.  He wondered about the possibility of microvascular angina,  recommended continuing her protein proton pump inhibitor.  Also, the  patient seen by Dr. Johnsie Cancel on the day of discharge, denied any pain at  that time, complained of some tingling in the left foot the previous  evening.  Cath site stable.  CT of the chest results as stated above.  Patient being discharged home with instructions to follow up with Dr.  Dannielle Burn in [place], within the next 2  weeks.  I have called the office and  left a message for them to arrange appointment.  The patient has been  given the post-cardiac catheterization discharge  instructions.  She is to continue her previous medications including  Protonix 40 mg b.i.d., Reglan 10 mg q.i.d., lisinopril/HCTZ 20/12.5 mg  daily, atenolol 25 mg daily, aspirin 81 mg b.i.d.   Duration of discharge encounter less than 30 minutes.      Rosanne Sack, ACNP      Wallis Bamberg. Johnsie Cancel, MD, [facility]  Electronically Signed    MB/MEDQ  D:  04/29/2007  T:  04/29/2007  Job:  [id number]   cc:   Dr. Liane Comber

## 2011-04-06 NOTE — Assessment & Plan Note (Signed)
[name]                          [facility] OFFICE NOTE   [name], [name]                    MRN:          [id number]  DATE:04/11/2009                            DOB:          1956-11-05    PRIMARY CARE PHYSICIAN:  Celedonio Savage, MD   REASON FOR VISIT:  Shortness of breath and chest discomfort.   HISTORY OF PRESENT ILLNESS:  [name] was last seen in the office by  Dr. Dannielle Burn in 2008.  She presented at that time with chest pain and  shortness of breath.  Cardiac catheterization was done in June 2008,  which was reassuring, demonstrating no significant flow-limiting  coronary artery disease with only minor luminal irregularities and a  normal left ventricular ejection fraction of 55%.  She does have ongoing  cardiac risk factors including hypertension, hyperlipidemia, and  diabetes mellitus with reportedly difficult to control glucose.  She  reports that over the last few weeks she has had episodes of squeezing  in her upper chest as well as relative shortness of breath and wheezing,  sometimes at night associated with a cough that has been nonproductive.  She has had no fevers or chills.  No hemoptysis and no reproducible  exertional chest pain.  Her electrocardiogram today in the office shows  sinus rhythm with nonspecific ST-T wave changes.   In reviewing her chart, I also note a history of obstructive sleep apnea  as well as previous pulmonary function testing by Dr. Koleen Nimrod,  originally in January demonstrating 64% predicted FVC and 64% predicted  FEV-1, with followup studies on medical therapy showing significant  improvement with FVC of 82% predicted and FEV-1 of 94% predicted as of  March.  Back in January, she underwent an arthroscopic knee surgery and  was noted to have chest heaviness in the PACU as well as hypoxia.  She  was admitted for observation and ruled out for myocardial infarction.  She was diagnosed with asthma  at that time and has been on a combination  of Advair and albuterol from that point.  More recently, she was  referred for chest x-ray by Dr. Wenda Overland on Apr 04, 2009, demonstrating  cardiomegaly without pulmonary edema and clear lung fields.  She is  referred to [place] today to discuss the situation further.   ALLERGIES:  Intolerance to NONSTEROIDAL ANTIINFLAMMATORY DRUGS, SULFA  DRUGS, and LATEX.  ZELNORM causes diarrhea.  FELDENE causes nausea.  NAPROSYN causes nausea.   PAST MEDICAL HISTORY:  As outlined above.  She is status post  cholecystectomy, abdominal hysterectomy, and hemorrhoidectomy in the  past.  She also reports problems with chronic esophageal reflux.  In  January, she had a meniscal tear involving the left knee.   FAMILY HISTORY:  Significant for cardiovascular disease.  The patient's  father died at age 67 with heart disease and her mother had a myocardial  infarction at the age of 12.  She also has a brother with cardiovascular  disease at early age.   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  She is  not smoking.   REVIEW OF SYSTEMS:  Outlined above.  Otherwise, reviewed and negative.   PHYSICAL EXAMINATION:  VITAL SIGNS:  Blood pressure 133/85, heart rate  is 72, and weight is 230 pounds.  GENERAL:  This is as an overweight woman in no acute distress.  HEENT:  Conjunctivae are normal.  Oropharynx is clear.  NECK:  Supple.  No elevated jugular venous pressure.  No loud bruits or  thyromegaly.  LUNGS:  Generally clear with diminished breath sounds.  No active  wheezing.  CARDIAC:  Regular rate and rhythm.  No S3, gallop, or pericardial rub.  No pathologic systolic murmur.  PMI is indistinct.  ABDOMEN:  Soft and  nontender.  Bowel sounds present.  Unable to palpate liver edge.  EXTREMITIES:  No frank pitting edema.  Distal pulses are 1-2+.  SKIN:  Warm and dry.  MUSCULOSKELETAL:  No kyphosis noted.   NEUROPSYCHIATRIC:  The patient is alert and oriented x3.  Affect is  appropriate.   IMPRESSION AND RECOMMENDATIONS:  Recent episodes of chest discomfort and  shortness of breath in the setting of asthma diagnosis made back in  January, and also several cardiac risk factors.  She does report some  improvement with albuterol, although not complete resolution.  She has  also had some nocturnal coughing, nonproductive, and not associated with  any fevers or chills.  Her electrocardiogram is nonspecific.  She has a  history of prior presentations with chest pain, shortness of breath, and  underwent a cardiac catheterization in 2008, which demonstrated minimal  luminal irregularities without any obstructive coronary artery disease.  Cardiac risk factor profile includes significant family history,  obesity, diabetes mellitus, hypertension, and hyperlipidemia.  Recent  chest x-ray suggests cardiomegaly.  We have reviewed these issues and  will plan a followup 2-D echocardiogram to clearly assess cardiac size  and function, and also plan a followup dobutamine Cardiolite to reassess  for ischemia.  If these studies are reassuring, it would suggest perhaps  a pulmonary etiology to her presentation, although breakthrough reflux  symptoms may also be exacerbating the situation.  We will plan to bring  her back to the office over the next few weeks to discuss the results.  No specific medication changes were made today, otherwise.     Satira Sark, MD  Electronically Signed    SGM/MedQ  DD: 04/11/2009  DT: 04/12/2009  Job #: [id number]   cc:   Celedonio Savage, MD

## 2011-04-06 NOTE — Assessment & Plan Note (Signed)
[facility]                          [facility] OFFICE NOTE   Kimberly, [name]              MRN:          [id number]  DATE:05/18/2007                            DOB:          Feb 05, 1956    [facility]ICE NOTE   HISTORY OF PRESENT ILLNESS:  The patient is a 55 year old female with a  history of substernal chest pain.  The patient had an abnormal  Cardiolite study and underwent cardiac catheterization.  However, there  was no definite evidence of flow-limiting disease as documented by Dr.  Domenic Polite.  The patient was kept overnight and a CT scan was performed to  rule out pulmonary emboli.  This was negative.  The patient was found to  have a mild T12 wedge compression deformity with minimal retropulsion.  The patient does relate chronic pain at the level of T12.  From a  cardiac catheterization perspective, she has had no complications and is  stable.  She reports no swelling, hematoma, or pain in the right groin.   MEDICATIONS:  1. Protonix 40 mg p.o. b.i.d.  2. Reglan 10 mg p.o. q.i.d.  3. Lisinopril/hydrochlorothiazide 20/12.5 daily.  4. Medrol Dosepak has been discontinued.  5. Atenolol 25 mg a day.  6. Aspirin 81 mg p.o. b.i.d.   PHYSICAL EXAMINATION:  VITAL SIGNS:  Blood pressure 147/79, heart rate  61 beats per minute, weight 222 pounds.  An obese white female in no apparent distress.  NECK:  Normal carotid upstroke.  No carotid bruits.  LUNGS:  Clear breath sounds bilaterally.  HEART:  Regular rate and rhythm.  Normal S1, S2.  No murmurs, rubs, or  gallops.  ABDOMEN:  Soft and nontender.  No rebound or guarding.  Good bowel  sounds.  EXTREMITIES:  No cyanosis, clubbing, or edema.  NEURO:  The patient is alert and oriented, grossly nonfocal.   PROBLEM LIST:  1. Substernal chest pain, resolved.  2. Abnormal Cardiolite stress study, false positive with negative      catheterization.  3. Hypertension,  controlled.  4. T12 wedge compression fracture.   PLAN:  1. The patient will follow up with Dr. Woody Seller regarding the T12 wedge      compression fracture.  Also giving her the opportunity to call the      orthopedist, but she states that she will wait until she sees Dr.      Woody Seller first.  2. The patient can continue current medical therapy for hypertension.      There is no evidence of significant  coronary artery disease.  The      patient can be seen on a p.r.n. basis.     Ernestine Mcmurray, MD,FACC  Electronically Signed    GED/MedQ  DD: 05/18/2007  DT: 05/18/2007  Job #: [id number]   cc:   [name]

## 2011-04-16 ENCOUNTER — Encounter: Payer: Self-pay | Admitting: *Deleted

## 2011-04-16 ENCOUNTER — Telehealth: Payer: Self-pay | Admitting: *Deleted

## 2011-04-16 NOTE — Telephone Encounter (Signed)
Certified letter mailed to pt regarding labs that were due in January.

## 2011-04-23 ENCOUNTER — Telehealth: Payer: Self-pay | Admitting: *Deleted

## 2011-04-23 NOTE — Telephone Encounter (Signed)
Pt received certified letter regarding labs. She states she no longer has insurance and cannot afford to do labs. She states she knows she needs an appt also but is not sure what to do w/o insurance. Pt instructed to p/u copy of [facility] assistance form and complete asap. Pt should return to our office. If she is approved for assistance, she can schedule office visit (though she is aware we will not refuse to see her d/t payment problems) and possibly have labs done at [facility] or another [facility] owned facility. Pt verbalized understanding.

## 2011-05-27 ENCOUNTER — Encounter: Payer: Self-pay | Admitting: Physician Assistant

## 2011-06-15 ENCOUNTER — Other Ambulatory Visit: Payer: Self-pay | Admitting: Physician Assistant

## 2011-08-03 DIAGNOSIS — R079 Chest pain, unspecified: Secondary | ICD-10-CM

## 2011-08-10 ENCOUNTER — Telehealth: Payer: Self-pay | Admitting: *Deleted

## 2011-08-10 NOTE — Telephone Encounter (Signed)
D/C from [facility] on Wednesday, 9/12.  Was there for fluid around lungs and heart.  Also, staph infection.  Feeling better than before, but still having pressure  & heaviness in chest.  Feeling smothery - notices more after eating & at bedtime.  Is on Protonix 12m BID.  Also, on Lasix 480mdaily & Imdur 60101maily.  Dizziness a little more.  No weight gain that she can tell.  Hands & feet are puffy though.  Offered earlier OV with Dr. AriFletcher Anonr 10/4.  Patient declined another provider.  Has OV scheduled for 10/17.

## 2011-08-12 NOTE — Telephone Encounter (Signed)
I would add her on for next week at some point.

## 2011-08-19 NOTE — Telephone Encounter (Signed)
Spoke with patient.  Already has OV scheduled for tomorrow with GD.

## 2011-08-20 ENCOUNTER — Encounter: Payer: Self-pay | Admitting: Cardiology

## 2011-08-20 ENCOUNTER — Ambulatory Visit (INDEPENDENT_AMBULATORY_CARE_PROVIDER_SITE_OTHER): Payer: Self-pay | Admitting: Cardiology

## 2011-08-20 VITALS — BP 133/83 | HR 62 | Ht 60.0 in | Wt 242.0 lb

## 2011-08-20 DIAGNOSIS — E785 Hyperlipidemia, unspecified: Secondary | ICD-10-CM

## 2011-08-20 DIAGNOSIS — M4800 Spinal stenosis, site unspecified: Secondary | ICD-10-CM

## 2011-08-20 DIAGNOSIS — G4733 Obstructive sleep apnea (adult) (pediatric): Secondary | ICD-10-CM

## 2011-08-20 DIAGNOSIS — R079 Chest pain, unspecified: Secondary | ICD-10-CM

## 2011-08-20 DIAGNOSIS — I5032 Chronic diastolic (congestive) heart failure: Secondary | ICD-10-CM

## 2011-08-20 DIAGNOSIS — I10 Essential (primary) hypertension: Secondary | ICD-10-CM

## 2011-08-20 MED ORDER — GABAPENTIN 300 MG PO CAPS: 300.0000 mg | ORAL_CAPSULE | Freq: Three times a day (TID) | ORAL | Status: DC

## 2011-08-20 MED ORDER — NITROGLYCERIN 0.4 MG SL SUBL: 0.4000 mg | SUBLINGUAL_TABLET | SUBLINGUAL | Status: DC | PRN

## 2011-08-20 NOTE — Patient Instructions (Signed)
   Stop Mobic  Neurontin 300mg  three times a day   Raise bed 5%  Nitroglycerin as needed for severe chest pain Your physician wants you to follow up in: 6 months.  You will receive a reminder letter in the mail one-two months in advance.  If you don't receive a letter, please call our office to schedule the follow up appointment

## 2011-08-23 DIAGNOSIS — G4733 Obstructive sleep apnea (adult) (pediatric): Secondary | ICD-10-CM | POA: Insufficient documentation

## 2011-08-23 DIAGNOSIS — M4800 Spinal stenosis, site unspecified: Secondary | ICD-10-CM | POA: Insufficient documentation

## 2011-08-23 NOTE — Assessment & Plan Note (Signed)
See below

## 2011-08-23 NOTE — Assessment & Plan Note (Addendum)
Some element of right-sided failure with obstructive sleep apnea and dependent lower extremity edema. Continue Lasix. I asked the patient to curtail her salt intake and also discontinue Mobic.

## 2011-08-23 NOTE — Assessment & Plan Note (Signed)
Patient previously was on gabapentin but restarted this as this appeared to give some relief to the patient

## 2011-08-23 NOTE — Assessment & Plan Note (Signed)
Continue risk factor modification

## 2011-08-23 NOTE — Progress Notes (Signed)
History of present illness:  The patient is a 55 year old female history of recurrent chest pain and nonobstructive coronary artery disease by catheterization 2008. She was recently seen in the hospital and ruled out for myocardial infarction and was felt to have significant esophageal reflux disease with possible esophageal spasm. She does have multiple risk factors including hypertension, type 2 diabetes or family history of premature coronary artery disease. The patient presents for followup. She states that she still has substernal chest pain but mainly occurs at night when laying down. She describes a sharp pain in her lower sternal area. She does report that there is relief with nitroglycerin. Unfortunately she is currently still taking Mobic. She also has obstructive sleep apnea and is on CPAP and oxygen at night. She also reports symptoms consistent with spinal stenosis and no significant lower extremity pain. Just prior history of pancreatitis. She also has 2+ peripheral pitting edema. Patient had a stress echocardiogram done which was within normal limits on 08/04/2011  Allergies-family history and social history: As documented in the chart and reviewed  Medications: Documented in chart and reviewed  Past medical history: See problem list below and also documented in the chart  Review of systems: Patient reports lower extremity pain but no claudication. Positive for nausea but no vomiting. Lower extremity edema. Nocturnal cramping. Decreased exercise tolerance. No melena or hematochezia.  Physical examination: Vital signs listed below General: Overweight white female but in no distress HEENT: Oropharynx clear, neck supple, normal carotid upstroke no carotid bruits. No thyromegaly nonnodular thyroid Lungs: Clear breath sounds bilaterally no wheezing Heart: Regular rate and rhythm normal S1-S2 no murmurs or gallops Abdomen: Soft nontender no rebound or guarding Extremity exam: No  cyanosis or clubbing there is 2+ peripheral pitting edema Neuro: Patient is alert and oriented grossly nonfocal.  Psychiatric: Normal affect

## 2011-08-23 NOTE — Assessment & Plan Note (Signed)
Noncardiac chest pain likely GI in nature. Asked the patient to stop Mobic and referred her to GI for further evaluation. She can take when necessary nitroglycerin as this may help with relieving esophageal spasm.

## 2011-09-08 ENCOUNTER — Encounter: Payer: Self-pay | Admitting: Cardiology

## 2011-09-09 LAB — BASIC METABOLIC PANEL
CO2: 28
Calcium: 8.5
Creatinine, Ser: 0.97
GFR calc Af Amer: >60
GFR calc non Af Amer: >60
Sodium: 141

## 2011-09-09 LAB — I-STAT 8, (EC8 V) (CONVERTED LAB)
Acid-Base Excess: 1
Potassium: 4
TCO2: 31
pH, Ven: 7.29

## 2011-09-09 LAB — CBC
MCHC: 34.6
RBC: 3.83 — ABNORMAL LOW
RDW: 13.9

## 2011-09-09 LAB — POCT CARDIAC MARKERS
CKMB, poc: <1 — ABNORMAL LOW
Troponin i, poc: <0.05

## 2011-09-09 LAB — CK TOTAL AND CKMB (NOT AT ARMC)
CK, MB: 1.6
CK, MB: 1.7
Relative Index: INVALID
Relative Index: INVALID
Total CK: 37
Total CK: 40

## 2011-09-10 ENCOUNTER — Other Ambulatory Visit: Payer: Self-pay | Admitting: Physician Assistant

## 2012-01-18 DIAGNOSIS — R55 Syncope and collapse: Secondary | ICD-10-CM

## 2012-01-18 DIAGNOSIS — R0602 Shortness of breath: Secondary | ICD-10-CM

## 2012-01-18 DIAGNOSIS — R079 Chest pain, unspecified: Secondary | ICD-10-CM

## 2012-06-22 DIAGNOSIS — R079 Chest pain, unspecified: Secondary | ICD-10-CM

## 2013-06-06 ENCOUNTER — Encounter (INDEPENDENT_AMBULATORY_CARE_PROVIDER_SITE_OTHER): Payer: Self-pay | Admitting: *Deleted

## 2013-06-14 ENCOUNTER — Other Ambulatory Visit (INDEPENDENT_AMBULATORY_CARE_PROVIDER_SITE_OTHER): Payer: Self-pay | Admitting: *Deleted

## 2013-06-14 ENCOUNTER — Encounter (INDEPENDENT_AMBULATORY_CARE_PROVIDER_SITE_OTHER): Payer: Self-pay | Admitting: *Deleted

## 2013-06-14 DIAGNOSIS — Z800 Family history of malignant neoplasm of digestive organs: Secondary | ICD-10-CM

## 2013-06-19 ENCOUNTER — Encounter (INDEPENDENT_AMBULATORY_CARE_PROVIDER_SITE_OTHER): Payer: Self-pay

## 2013-06-20 ENCOUNTER — Telehealth (INDEPENDENT_AMBULATORY_CARE_PROVIDER_SITE_OTHER): Payer: Self-pay | Admitting: *Deleted

## 2013-06-20 DIAGNOSIS — Z1211 Encounter for screening for malignant neoplasm of colon: Secondary | ICD-10-CM

## 2013-06-20 NOTE — Telephone Encounter (Signed)
Patient needs movi prep 

## 2013-06-22 MED ORDER — PEG-KCL-NACL-NASULF-NA ASC-C 100 G PO SOLR: 1.0000 | Freq: Once | ORAL | Status: DC

## 2013-07-05 ENCOUNTER — Telehealth (INDEPENDENT_AMBULATORY_CARE_PROVIDER_SITE_OTHER): Payer: Self-pay | Admitting: *Deleted

## 2013-07-05 NOTE — Telephone Encounter (Signed)
  Procedure: tcs  Reason/Indication:  Fam hx colon ca  Has patient had this procedure before?  Yes, 8 yrs ago (scanned)  If so, when, by whom and where?    Is there a family history of colon cancer?  Yes, brothers  Who?  What age when diagnosed?    Is patient diabetic?   yes      Does patient have prosthetic heart valve?  no  Do you have a pacemaker?  no  Has patient ever had endocarditis? no  Has patient had joint replacement within last 12 months?  no  Is patient on Coumadin, Plavix and/or Aspirin? yes  Medications: asa 81 mg daily, synthroid 75 mcg daily, novolog 20 units @ lunch & 30 units @ supper also used as sliding scale, lantus 20 units @ breakfast & 80 units @ bedtime, lisinopril 20 mg bid, carvedilol 12.5 mg bid, advair 500/50 mg 1 puff bid, proventil 108 mcg 2 puffs prn for asthma, furosemide 20 mg bid, tramadol 50 mg 1 tab Q 6 hrs for pain, protonix 40 mg bid, pepcid 40 mg daily, gabapentin 400 mg bid  Allergies: sulfur drugs, naprosyn, propafhal, latex  Medication Adjustment: decrease Novolog to 10 units @ lunch & 10 units @ supper, continue sliding scale, decrease lantus to 40 units @ bedtime  Procedure date & time: 08/01/13 at 930

## 2013-07-05 NOTE — Telephone Encounter (Signed)
agree

## 2013-07-17 ENCOUNTER — Encounter (HOSPITAL_COMMUNITY): Payer: Self-pay | Admitting: Pharmacy Technician

## 2013-08-01 ENCOUNTER — Ambulatory Visit (HOSPITAL_COMMUNITY)
Admission: RE | Admit: 2013-08-01 | Discharge: 2013-08-01 | Disposition: A | Discharge status: Home / Self Care | Payer: Medicare Other | Source: Ambulatory Visit | Attending: Internal Medicine | Admitting: Internal Medicine

## 2013-08-01 ENCOUNTER — Encounter (HOSPITAL_COMMUNITY): Payer: Self-pay | Admitting: *Deleted

## 2013-08-01 ENCOUNTER — Encounter (HOSPITAL_COMMUNITY)
Admission: RE | Disposition: A | Discharge status: Home / Self Care | Payer: Self-pay | Source: Ambulatory Visit | Attending: Internal Medicine

## 2013-08-01 DIAGNOSIS — I10 Essential (primary) hypertension: Secondary | ICD-10-CM | POA: Insufficient documentation

## 2013-08-01 DIAGNOSIS — K5730 Diverticulosis of large intestine without perforation or abscess without bleeding: Secondary | ICD-10-CM | POA: Insufficient documentation

## 2013-08-01 DIAGNOSIS — Z794 Long term (current) use of insulin: Secondary | ICD-10-CM | POA: Insufficient documentation

## 2013-08-01 DIAGNOSIS — K648 Other hemorrhoids: Secondary | ICD-10-CM

## 2013-08-01 DIAGNOSIS — Z800 Family history of malignant neoplasm of digestive organs: Secondary | ICD-10-CM

## 2013-08-01 DIAGNOSIS — Z1211 Encounter for screening for malignant neoplasm of colon: Secondary | ICD-10-CM | POA: Insufficient documentation

## 2013-08-01 DIAGNOSIS — K644 Residual hemorrhoidal skin tags: Secondary | ICD-10-CM | POA: Insufficient documentation

## 2013-08-01 DIAGNOSIS — E119 Type 2 diabetes mellitus without complications: Secondary | ICD-10-CM | POA: Insufficient documentation

## 2013-08-01 DIAGNOSIS — Z01812 Encounter for preprocedural laboratory examination: Secondary | ICD-10-CM | POA: Insufficient documentation

## 2013-08-01 HISTORY — DX: Spinal stenosis, site unspecified: M48.00

## 2013-08-01 HISTORY — DX: Unspecified glaucoma: H40.9

## 2013-08-01 HISTORY — PX: COLONOSCOPY: SHX5424

## 2013-08-01 HISTORY — DX: Unspecified osteoarthritis, unspecified site: M19.90

## 2013-08-01 HISTORY — DX: Shortness of breath: R06.02

## 2013-08-01 HISTORY — DX: Unspecified asthma, uncomplicated: J45.909

## 2013-08-01 HISTORY — DX: Dorsalgia, unspecified: M54.9

## 2013-08-01 HISTORY — DX: Other chronic pain: G89.29

## 2013-08-01 LAB — GLUCOSE, CAPILLARY: Glucose-Capillary: 109 mg/dL — ABNORMAL HIGH (ref 70–99)

## 2013-08-01 SURGERY — COLONOSCOPY
Anesthesia: Moderate Sedation

## 2013-08-01 MED ORDER — MIDAZOLAM HCL 5 MG/5ML IJ SOLN
INTRAMUSCULAR | Status: DC | PRN
  Administered 2013-08-01 (×4): 2 mg via INTRAVENOUS

## 2013-08-01 MED ORDER — SODIUM CHLORIDE 0.9 % IV SOLN
INTRAVENOUS | Status: DC
  Administered 2013-08-01: 08:00:00 via INTRAVENOUS

## 2013-08-01 MED ORDER — MEPERIDINE HCL 50 MG/ML IJ SOLN
INTRAMUSCULAR | Status: DC | PRN
  Administered 2013-08-01 (×2): 25 mg via INTRAVENOUS

## 2013-08-01 MED ORDER — MIDAZOLAM HCL 5 MG/5ML IJ SOLN
INTRAMUSCULAR | Status: AC
  Filled 2013-08-01: qty 15

## 2013-08-01 MED ORDER — STERILE WATER FOR IRRIGATION IR SOLN
Status: DC | PRN
  Administered 2013-08-01: 09:00:00

## 2013-08-01 MED ORDER — MEPERIDINE HCL 50 MG/ML IJ SOLN
INTRAMUSCULAR | Status: AC
  Filled 2013-08-01: qty 1

## 2013-08-01 NOTE — H&P (Signed)
Kimberly [name] is an 57 y.o. female.   Chief Complaint: Patient is here for colonoscopy. HPI: Patient is 57 year old Caucasian female who is here for screening colonoscopy. Her last exam was in 2006 examination in [place], [place]. She has intermittent hematochezia felt to be secondary to hemorrhoids. She also has intermittent left-sided, pain usually with bowel movements. Family history significant for colon carcinoma and one brother who is 69 the time of diagnosis and died 2 years later. Another brother had surgery for colon carcinoma at age 17 and is doing fine 2 years later. She has 10 other siblings without colon issues. Past Medical History  Diagnosis Date  . CAD (coronary artery disease)     nonobstructive by cath, 6/08 (false positive Cardiolite) normal stress echo, 7/11  . Heart failure, diastolic, chronic   . GERD (gastroesophageal reflux disease)   . Morbid obesity   . Obstructive sleep apnea   . IDDM (insulin dependent diabetes mellitus)   . HTN (hypertension)   . Shortness of breath   . Asthma   . Spinal stenosis   . Chronic back pain   . Arthritis   . Degenerative joint disease   . Glaucoma     Past Surgical History  Procedure Laterality Date  . Eye surgery  1967  . Tubal ligation  1975  . Partial hysterectomy  1981  . Hemorrhoid surgery    . Hernia repair    . Spinahatomy  1992  . Cholecystectomy  1994  . Ovaries removed  2003  . Heel (other)  2005  . Abdominal hysterectomy    . Bilateral knee arthroscopy  2000, 2009    Family History  Problem Relation Age of Onset  . Cancer Other   . Heart failure Other   . Colon cancer Brother    Social History:  reports that she has never smoked. She has never used smokeless tobacco. She reports that she does not drink alcohol or use illicit drugs.  Allergies:  Allergies  Allergen Reactions  . Naproxen Anaphylaxis    REACTION: throat swelling  . Other     Local anesthia Had asthma attack  . Sulfonamide  Derivatives     Yeast infection    Medications Prior to Admission  Medication Sig Dispense Refill  . albuterol (PROVENTIL) 90 MCG/ACT inhaler Inhale 2 puffs into the lungs every 6 (six) hours as needed for wheezing or shortness of breath.       . ALPRAZolam (XANAX) 0.5 MG tablet Take 0.5 mg by mouth at bedtime as needed for sleep.       Marland Kitchen aspirin (ASPIR-LOW) 81 MG EC tablet Take 81 mg by mouth daily.        . carvedilol (COREG) 12.5 MG tablet Take 12.5 mg by mouth 2 (two) times daily with a meal.      . doxycycline (ADOXA) 100 MG tablet Take 100 mg by mouth 2 (two) times daily.      Marland Kitchen FLUoxetine (PROZAC) 20 MG capsule Take 20 mg by mouth daily as needed (anxiety).      . Fluticasone-Salmeterol (ADVAIR DISKUS) 500-50 MCG/DOSE AEPB Inhale 1 puff into the lungs 2 (two) times daily.        . furosemide (LASIX) 40 MG tablet TAKE ONE TABLET BY MOUTH TWICE DAILY  60 tablet  6  . gabapentin (NEURONTIN) 400 MG capsule Take 400 mg by mouth 2 (two) times daily.      Marland Kitchen glyBURIDE (DIABETA) 5 MG tablet Take 10 mg  by mouth daily with breakfast. And 1 with supper       . HYDROcodone-acetaminophen (VICODIN) 5-500 MG per tablet Take 1 tablet by mouth every 8 (eight) hours as needed for pain.       Marland Kitchen insulin aspart (NOVOLOG FLEXPEN) 100 UNIT/ML injection Inject 30-40 Units into the skin as directed. 30 at lunch, 40 at supper. Then sliding scale as needed.      . insulin glargine (LANTUS SOLOSTAR) 100 UNIT/ML injection Inject 20-80 Units into the skin at bedtime. 20 at breakfast and 80 at bedtime.      Marland Kitchen ipratropium-albuterol (DUONEB) 0.5-2.5 (3) MG/3ML SOLN Take 3 mLs by nebulization 4 (four) times daily.        . Levothyroxine Sodium 75 MCG CAPS Take 1 capsule by mouth daily.       Marland Kitchen lisinopril (PRINIVIL,ZESTRIL) 10 MG tablet Take 10 mg by mouth daily.        . metoprolol tartrate (LOPRESSOR) 25 MG tablet Take 12.5 mg by mouth 2 (two) times daily.      . NON FORMULARY CPAP: use as directed        .  OXYGEN-HELIUM IN Inhale 2.5 L into the lungs. OXYGEN 2L: use as directed with cpap      . pantoprazole (PROTONIX) 40 MG tablet Take 40 mg by mouth 2 (two) times daily.        . peg 3350 powder (MOVIPREP) 100 G SOLR Take 1 kit (200 g total) by mouth once.  1 kit  0  . tiZANidine (ZANAFLEX) 4 MG tablet Take 4 mg by mouth every 8 (eight) hours as needed (muscle spasms).      . traMADol (ULTRAM) 50 MG tablet Take 50 mg by mouth every 6 (six) hours as needed for pain.        Results for orders placed during the hospital encounter of 08/01/13 (from the past 48 hour(s))  GLUCOSE, CAPILLARY     Status: Abnormal   Collection Time    08/01/13  8:21 AM      Result Value Range   Glucose-Capillary 109 (*) 70 - 99 mg/dL   No results found.  ROS  Blood pressure 166/80, pulse 62, temperature 97.7 F (36.5 C), temperature source Oral, resp. rate 18, height 5' (1.524 m), weight 247 lb (112.038 kg), SpO2 96.00%. Physical Exam  Constitutional: She appears well-developed and well-nourished.  HENT:  Mouth/Throat: Oropharynx is clear and moist.  Eyes: Conjunctivae are normal.  Neck: No thyromegaly present.  Cardiovascular: Normal rate, regular rhythm and normal heart sounds.   No murmur heard. Respiratory: Effort normal and breath sounds normal.  GI: Soft. She exhibits no mass. Tenderness: mild tenderness at LUQ.  Musculoskeletal: She exhibits no edema.  Lymphadenopathy:    She has no cervical adenopathy.  Neurological: She is alert.  Skin: Skin is warm and dry.     Assessment/Plan High-risk screening colonoscopy. Family history of colon carcinoma in two siblings.  Kimberly [name] U 08/01/2013, 8:35 AM

## 2013-08-01 NOTE — Op Note (Signed)
COLONOSCOPY PROCEDURE REPORT  PATIENT:  Kimberly [name]  MR#:  [id number] Birthdate:  November 05, 1956, 57 y.o., female Endoscopist:  Dr. Rogene Houston, MD Referred By:  Dr. Glenda Chroman, MD Procedure Date: 08/01/2013  Procedure:   Colonoscopy  Indications:  Patient is 57 year old Caucasian female who is undergoing high risk screening colonoscopy. Patient's last exam was in 2006. Family history significant for colon carcinoma and 2 brothers; one was diagnosed at 68 and died of mets at 84 another brother had surgery at age 77 and is doing fine and 74.  Informed Consent:  The procedure and risks were reviewed with the patient and informed consent was obtained.  Medications:  Demerol 50 mg IV Versed 8 mg IV  Description of procedure:  After a digital rectal exam was performed, that colonoscope was advanced from the anus through the rectum and colon to the area of the cecum, ileocecal valve and appendiceal orifice. The cecum was deeply intubated. These structures were well-seen and photographed for the record. From the level of the cecum and ileocecal valve, the scope was slowly and cautiously withdrawn. The mucosal surfaces were carefully surveyed utilizing scope tip to flexion to facilitate fold flattening as needed. The scope was pulled down into the rectum where a thorough exam including retroflexion was performed.  Findings:   Prep satisfactory. Few small diverticula and sigmoid colon. No evidence of polyps or other lesions. Normal rectal mucosa. Small hemorrhoids above and below the dentate line.   Therapeutic/Diagnostic Maneuvers Performed:  None  Complications:  None  Cecal Withdrawal Time:  8 minutes  Impression:  Examination performed to cecum. Few small diverticula and sigmoid colon. Internal/external hemorrhoids.  Recommendations:  Standard instructions given. Next screening exam in 5 years.  Kaylianna Detert U  08/01/2013 9:06 AM  CC: Dr. Glenda Chroman., MD & Dr. Rayne Du ref.  provider found

## 2013-08-03 ENCOUNTER — Encounter (HOSPITAL_COMMUNITY): Payer: Self-pay | Admitting: Internal Medicine

## 2013-09-27 ENCOUNTER — Other Ambulatory Visit: Payer: Self-pay

## 2014-06-21 ENCOUNTER — Encounter (HOSPITAL_COMMUNITY): Payer: Self-pay | Admitting: Emergency Medicine

## 2014-06-21 ENCOUNTER — Emergency Department (HOSPITAL_COMMUNITY): Payer: Medicare Other

## 2014-06-21 ENCOUNTER — Observation Stay (HOSPITAL_COMMUNITY)
Admission: EM | Admit: 2014-06-21 | Discharge: 2014-06-23 | Disposition: A | Discharge status: Home / Self Care | Payer: Medicare Other | Attending: Internal Medicine | Admitting: Internal Medicine

## 2014-06-21 DIAGNOSIS — E109 Type 1 diabetes mellitus without complications: Secondary | ICD-10-CM

## 2014-06-21 DIAGNOSIS — M4800 Spinal stenosis, site unspecified: Secondary | ICD-10-CM | POA: Diagnosis present

## 2014-06-21 DIAGNOSIS — E119 Type 2 diabetes mellitus without complications: Secondary | ICD-10-CM | POA: Insufficient documentation

## 2014-06-21 DIAGNOSIS — J45909 Unspecified asthma, uncomplicated: Secondary | ICD-10-CM | POA: Insufficient documentation

## 2014-06-21 DIAGNOSIS — R0789 Other chest pain: Principal | ICD-10-CM | POA: Insufficient documentation

## 2014-06-21 DIAGNOSIS — G4733 Obstructive sleep apnea (adult) (pediatric): Secondary | ICD-10-CM | POA: Diagnosis not present

## 2014-06-21 DIAGNOSIS — E785 Hyperlipidemia, unspecified: Secondary | ICD-10-CM

## 2014-06-21 DIAGNOSIS — I5032 Chronic diastolic (congestive) heart failure: Secondary | ICD-10-CM | POA: Insufficient documentation

## 2014-06-21 DIAGNOSIS — Z79899 Other long term (current) drug therapy: Secondary | ICD-10-CM | POA: Diagnosis not present

## 2014-06-21 DIAGNOSIS — K219 Gastro-esophageal reflux disease without esophagitis: Secondary | ICD-10-CM | POA: Diagnosis not present

## 2014-06-21 DIAGNOSIS — M1990 Unspecified osteoarthritis, unspecified site: Secondary | ICD-10-CM | POA: Diagnosis not present

## 2014-06-21 DIAGNOSIS — Z794 Long term (current) use of insulin: Secondary | ICD-10-CM | POA: Insufficient documentation

## 2014-06-21 DIAGNOSIS — M129 Arthropathy, unspecified: Secondary | ICD-10-CM | POA: Diagnosis not present

## 2014-06-21 DIAGNOSIS — I2510 Atherosclerotic heart disease of native coronary artery without angina pectoris: Secondary | ICD-10-CM | POA: Insufficient documentation

## 2014-06-21 DIAGNOSIS — R079 Chest pain, unspecified: Secondary | ICD-10-CM | POA: Diagnosis present

## 2014-06-21 DIAGNOSIS — I10 Essential (primary) hypertension: Secondary | ICD-10-CM | POA: Diagnosis present

## 2014-06-21 DIAGNOSIS — Z7982 Long term (current) use of aspirin: Secondary | ICD-10-CM | POA: Insufficient documentation

## 2014-06-21 DIAGNOSIS — G8929 Other chronic pain: Secondary | ICD-10-CM | POA: Diagnosis not present

## 2014-06-21 DIAGNOSIS — N898 Other specified noninflammatory disorders of vagina: Secondary | ICD-10-CM

## 2014-06-21 DIAGNOSIS — E663 Overweight: Secondary | ICD-10-CM

## 2014-06-21 LAB — CBC WITH DIFFERENTIAL/PLATELET
BASOS ABS: 0 10*3/uL (ref 0.0–0.1)
BASOS PCT: 0 % (ref 0–1)
EOS ABS: 0.1 10*3/uL (ref 0.0–0.7)
EOS PCT: 3 % (ref 0–5)
HEMATOCRIT: 38.9 % (ref 36.0–46.0)
HEMOGLOBIN: 14 g/dL (ref 12.0–15.0)
Lymphocytes Relative: 43 % (ref 12–46)
Lymphs Abs: 2.2 10*3/uL (ref 0.7–4.0)
MCH: 32.9 pg (ref 26.0–34.0)
MCHC: 36 g/dL (ref 30.0–36.0)
MCV: 91.3 fL (ref 78.0–100.0)
MONO ABS: 0.4 10*3/uL (ref 0.1–1.0)
MONOS PCT: 8 % (ref 3–12)
Neutro Abs: 2.4 10*3/uL (ref 1.7–7.7)
Neutrophils Relative %: 46 % (ref 43–77)
Platelets: 147 10*3/uL — ABNORMAL LOW (ref 150–400)
RBC: 4.26 MIL/uL (ref 3.87–5.11)
RDW: 13.8 % (ref 11.5–15.5)
WBC: 5.1 10*3/uL (ref 4.0–10.5)

## 2014-06-21 LAB — URINALYSIS, ROUTINE W REFLEX MICROSCOPIC
BILIRUBIN URINE: NEGATIVE
KETONES UR: NEGATIVE mg/dL
LEUKOCYTES UA: NEGATIVE
Nitrite: NEGATIVE
PROTEIN: NEGATIVE mg/dL
Specific Gravity, Urine: <1.005 — ABNORMAL LOW (ref 1.005–1.030)
Urobilinogen, UA: 1 mg/dL (ref 0.0–1.0)
pH: 7 (ref 5.0–8.0)

## 2014-06-21 LAB — COMPREHENSIVE METABOLIC PANEL
ALBUMIN: 3.8 g/dL (ref 3.5–5.2)
ALT: 21 U/L (ref 0–35)
ANION GAP: 10 (ref 5–15)
AST: 35 U/L (ref 0–37)
Alkaline Phosphatase: 108 U/L (ref 39–117)
BILIRUBIN TOTAL: 2 mg/dL — AB (ref 0.3–1.2)
BUN: 9 mg/dL (ref 6–23)
CALCIUM: 9.1 mg/dL (ref 8.4–10.5)
CO2: 29 mEq/L (ref 19–32)
CREATININE: 0.72 mg/dL (ref 0.50–1.10)
Chloride: 101 mEq/L (ref 96–112)
GFR calc Af Amer: >90 mL/min (ref >90)
Glucose, Bld: 203 mg/dL — ABNORMAL HIGH (ref 70–99)
Potassium: 4 mEq/L (ref 3.7–5.3)
Sodium: 140 mEq/L (ref 137–147)
Total Protein: 7.6 g/dL (ref 6.0–8.3)

## 2014-06-21 LAB — LIPASE, BLOOD: LIPASE: 88 U/L — AB (ref 11–59)

## 2014-06-21 LAB — URINE MICROSCOPIC-ADD ON

## 2014-06-21 LAB — PROTIME-INR
INR: 1.02 (ref 0.00–1.49)
Prothrombin Time: 13.4 seconds (ref 11.6–15.2)

## 2014-06-21 LAB — TROPONIN I

## 2014-06-21 MED ORDER — MORPHINE SULFATE 4 MG/ML IJ SOLN
4.0000 mg | Freq: Once | INTRAMUSCULAR | Status: AC
  Administered 2014-06-21: 4 mg via INTRAVENOUS
  Filled 2014-06-21: qty 1

## 2014-06-21 MED ORDER — ASPIRIN 81 MG PO CHEW
324.0000 mg | CHEWABLE_TABLET | Freq: Once | ORAL | Status: AC
  Administered 2014-06-21: 324 mg via ORAL
  Filled 2014-06-21: qty 4

## 2014-06-21 NOTE — ED Provider Notes (Signed)
CSN: [id number]     Arrival date & time 06/21/14  2118 History   First MD Initiated Contact with Patient 06/21/14 2145    This chart was scribed for Meredeth Ide, MD by Marica Otter, ED Scribe. This patient was seen in room A315/A315-01 and the patient's care was started at 9:45 PM.  Chief Complaint  Patient presents with  . Chest Pain  PCP: VYAS,DHRUV B., MD  The history is provided by the patient. No language interpreter was used.   HPI Comments: Kimberly [name] is a 58 y.o. female, with an extensive medical Hx noted below, significant for CAD, heart failure, SOB, HTN, IDDM, who presents to the Emergency Department complaining of constant central chest pain radiating down the back onset 1 hours ago while pt was resting. Pt reports exertion makes the chest pain worse. Per pt, she had similar pain approximately one year ago. Pt reports that while she has had stress tests in the past, she did not have one following her last similar episode of chest pain. Pt denies any Hx of heart attack, however, reports she did have a stroke. Pt denies weakness, loss of bladder control or loss of control over bowel movements.    Pt also complains of a vaginal boil; and worsening dysuria and vaginal bleeding onset approximately 1 week ago. Pt denies blood in her urine.   Pt reports she takes a baby aspirin daily, however, denies any other blood thinners.    Past Medical History  Diagnosis Date  . CAD (coronary artery disease)     nonobstructive by cath, 6/08 (false positive Cardiolite) normal stress echo, 7/11  . Heart failure, diastolic, chronic   . GERD (gastroesophageal reflux disease)   . Morbid obesity   . Obstructive sleep apnea   . IDDM (insulin dependent diabetes mellitus)   . HTN (hypertension)   . Shortness of breath   . Asthma   . Spinal stenosis   . Chronic back pain   . Arthritis   . Degenerative joint disease   . Glaucoma    Past Surgical History  Procedure Laterality Date  . Eye  surgery  1967  . Tubal ligation  1975  . Partial hysterectomy  1981  . Hemorrhoid surgery    . Hernia repair    . Spinahatomy  1992  . Cholecystectomy  1994  . Ovaries removed  2003  . Heel (other)  2005  . Abdominal hysterectomy    . Bilateral knee arthroscopy  2000, 2009  . Colonoscopy N/A 08/01/2013    Procedure: COLONOSCOPY;  Surgeon: Malissa Hippo, MD;  Location: [facility];  Service: Endoscopy;  Laterality: N/A;  930   Family History  Problem Relation Age of Onset  . Cancer Other   . Heart failure Other   . Colon cancer Brother    History  Substance Use Topics  . Smoking status: Never Smoker   . Smokeless tobacco: Never Used  . Alcohol Use: No   OB History   Grav Para Term Preterm Abortions TAB SAB Ect Mult Living                 Review of Systems  A complete 10 system review of systems was obtained and all systems are negative except as noted in the HPI and PMH.    Allergies  Naproxen; Other; and Sulfonamide derivatives  Home Medications   Prior to Admission medications   Medication Sig Start Date End Date Taking? Authorizing Provider  albuterol (VENTOLIN HFA) 108 (90 BASE) MCG/ACT inhaler Inhale 2 puffs into the lungs 2 (two) times daily.   Yes Historical Provider, MD  aspirin EC 81 MG tablet Take 81 mg by mouth daily.   Yes Historical Provider, MD  carvedilol (COREG) 12.5 MG tablet Take 12.5 mg by mouth 2 (two) times daily with a meal.   Yes Historical Provider, MD  famotidine (PEPCID) 40 MG tablet Take 40 mg by mouth daily.   Yes Historical Provider, MD  Fluticasone-Salmeterol (ADVAIR DISKUS) 500-50 MCG/DOSE AEPB Inhale 2 puffs into the lungs 2 (two) times daily.    Yes Historical Provider, MD  furosemide (LASIX) 20 MG tablet Take 20-40 mg by mouth daily as needed for fluid.   Yes Historical Provider, MD  gabapentin (NEURONTIN) 100 MG capsule Take 100 mg by mouth every evening.   Yes Historical Provider, MD  HYDROcodone-acetaminophen (NORCO) 7.5-325 MG  per tablet Take 1 tablet by mouth daily as needed for moderate pain.   Yes Historical Provider, MD  insulin aspart (NOVOLOG FLEXPEN) 100 UNIT/ML injection Inject 10-15 Units into the skin 2 (two) times daily. 10 units at lunch, 15 units at supper. Then sliding scale as needed.   Yes Historical Provider, MD  insulin glargine (LANTUS SOLOSTAR) 100 UNIT/ML injection Inject 30 Units into the skin at bedtime.    Yes Historical Provider, MD  ipratropium-albuterol (DUONEB) 0.5-2.5 (3) MG/3ML SOLN Take 3 mLs by nebulization daily as needed (for shortness of breath).    Yes Historical Provider, MD  ketotifen (THERA TEARS ALLERGY) 0.025 % ophthalmic solution Place 2 drops into both eyes 4 (four) times daily. *Uses drops 2 to 4 times daily*   Yes Historical Provider, MD  Levothyroxine Sodium 75 MCG CAPS Take 1 capsule by mouth daily.    Yes Historical Provider, MD  Liraglutide (VICTOZA) 18 MG/3ML SOPN Inject 1.8 mg into the skin daily.   Yes Historical Provider, MD  lisinopril (PRINIVIL,ZESTRIL) 20 MG tablet Take 20 mg by mouth daily.   Yes Historical Provider, MD  OXYGEN-HELIUM IN Inhale 2.5 L into the lungs at bedtime. OXYGEN 2L: use as directed with cpap   Yes Historical Provider, MD  pantoprazole (PROTONIX) 40 MG tablet Take 40 mg by mouth daily.    Yes Historical Provider, MD  rOPINIRole (REQUIP) 1 MG tablet Take 1 mg by mouth at bedtime.   Yes Historical Provider, MD  NON FORMULARY CPAP: use as directed      Historical Provider, MD   Triage Vitals: BP 146/86  Pulse 85  Temp(Src) 97.5 F (36.4 C) (Oral)  Resp 18  Ht 5' (1.524 m)  Wt 254 lb (115.214 kg)  BMI 49.61 kg/m2  SpO2 100% Physical Exam  Nursing note and vitals reviewed. Constitutional: She is oriented to person, place, and time. She appears well-developed and well-nourished. No distress.  HENT:  Head: Normocephalic and atraumatic.  Mouth/Throat: Oropharynx is clear and moist. No oropharyngeal exudate.  Eyes: Conjunctivae and EOM are  normal. Pupils are equal, round, and reactive to light.  Neck: Normal range of motion. Neck supple.  No meningismus.  Cardiovascular: Normal rate, regular rhythm, normal heart sounds and intact distal pulses.   No murmur heard. Pain not able to be reproduced but chest wall is tender  Pulmonary/Chest: Effort normal and breath sounds normal. No respiratory distress. She exhibits tenderness.  Abdominal: Soft. There is no tenderness. There is no rebound and no guarding.  Genitourinary:  Chaperone Present: Small areas ulceration to left labia minora. Left  labia majora 0.25 cm area of consistent with folliculitis.   Musculoskeletal: Normal range of motion. She exhibits no edema and no tenderness.  Neurological: She is alert and oriented to person, place, and time. No cranial nerve deficit. She exhibits normal muscle tone. Coordination normal.  No ataxia on finger to nose bilaterally. No pronator drift. 5/5 strength throughout. CN 2-12 intact. Negative Romberg. Equal grip strength. Sensation intact. Gait is normal.   Skin: Skin is warm.  Psychiatric: She has a normal mood and affect. Her behavior is normal.    ED Course  Procedures (including critical care time) DIAGNOSTIC STUDIES: Oxygen Saturation is 100% on RA, normal by my interpretation.    COORDINATION OF CARE: 10:01 PM-Discussed treatment plan which includes UA, labs and pelvic exam with pt at bedside and pt agreed to plan.     Labs Review Labs Reviewed  URINALYSIS, ROUTINE W REFLEX MICROSCOPIC - Abnormal; Notable for the following:    Specific Gravity, Urine <1.005 (*)    Glucose, UA >1000 (*)    Hgb urine dipstick TRACE (*)    All other components within normal limits  CBC WITH DIFFERENTIAL - Abnormal; Notable for the following:    Platelets 147 (*)    All other components within normal limits  COMPREHENSIVE METABOLIC PANEL - Abnormal; Notable for the following:    Glucose, Bld 203 (*)    Total Bilirubin 2.0 (*)    All other  components within normal limits  LIPASE, BLOOD - Abnormal; Notable for the following:    Lipase 88 (*)    All other components within normal limits  URINE MICROSCOPIC-ADD ON - Abnormal; Notable for the following:    Squamous Epithelial / LPF FEW (*)    Bacteria, UA FEW (*)    All other components within normal limits  HERPES SIMPLEX VIRUS CULTURE  TROPONIN I  PROTIME-INR  LIPASE, BLOOD  CBC  COMPREHENSIVE METABOLIC PANEL  TROPONIN I  TROPONIN I  TROPONIN I    Imaging Review Dg Chest 2 View  06/21/2014   CLINICAL DATA:  Chest pain and shortness of breath.  EXAM: CHEST  2 VIEW  COMPARISON:  12/11/2013  FINDINGS: The heart size and mediastinal contours are within normal limits. Both lungs are clear. The visualized skeletal structures are unremarkable.  IMPRESSION: No active cardiopulmonary disease.   Electronically Signed   By: Burman Nieves M.D.   On: 06/21/2014 22:31     EKG Interpretation   Date/Time:  Friday June 21 2014 21:38:26 EDT Ventricular Rate:  77 PR Interval:  130 QRS Duration: 80 QT Interval:  399 QTC Calculation: 452 R Axis:   56 Text Interpretation:  Sinus rhythm No significant change was found  Confirmed by Manus Gunning  MD, Ephrem Carrick [phone]) on 06/21/2014 10:02:33 PM      MDM   Final diagnoses:  Chest pain, unspecified chest pain type   patient with left-sided chest pain it radiates to her left arm associated with shortness of breath, nausea and dizziness. Onset at rest one hour ago. Worse with exertion. EKG is unchanged. No history of MI or stents. She has multiple risk factors. Catheterization in 2008 below.  Patient also complains of dysuria, vaginal pain. Small area of folliculitis. UA negative. Ulcerated area suspicious for herpes, culture sent.  Denies recent sexual activity.  LHC 2008: 1. No significant flow-limiting coronary atherosclerosis noted within  major epicardial vessels. There is some distal tapering of the  left anterior descending,  although flow is TIMI III.  2. Left ventricular ejection fraction approximately 55% without  significant wall motion abnormality. No significant mitral  regurgitation. The left ventricle end-diastolic pressure of 16  mmHg.   Pain is somewhat typical.  EKG unchanged, Troponin negative.  ASA given (patient states can tolerate). With risk factors, will admit for chest pain rule out.  I personally performed the services described in this documentation, which was scribed in my presence. The recorded information has been reviewed and is accurate.   Glynn OctaveStephen Trust Leh, MD 06/22/14 [phone]

## 2014-06-21 NOTE — ED Notes (Signed)
States she has asthma, breathing in and out makes pain worse. Multiple complaints on this admition including swelling to feet and ankles, possible boil to vagina, pain on urination.

## 2014-06-22 ENCOUNTER — Encounter (HOSPITAL_COMMUNITY): Payer: Self-pay | Admitting: *Deleted

## 2014-06-22 DIAGNOSIS — N898 Other specified noninflammatory disorders of vagina: Secondary | ICD-10-CM

## 2014-06-22 DIAGNOSIS — I10 Essential (primary) hypertension: Secondary | ICD-10-CM

## 2014-06-22 DIAGNOSIS — R079 Chest pain, unspecified: Secondary | ICD-10-CM

## 2014-06-22 DIAGNOSIS — I5032 Chronic diastolic (congestive) heart failure: Secondary | ICD-10-CM

## 2014-06-22 DIAGNOSIS — G4733 Obstructive sleep apnea (adult) (pediatric): Secondary | ICD-10-CM

## 2014-06-22 LAB — COMPREHENSIVE METABOLIC PANEL
ALT: 22 U/L (ref 0–35)
AST: 40 U/L — AB (ref 0–37)
Albumin: 3.7 g/dL (ref 3.5–5.2)
Alkaline Phosphatase: 107 U/L (ref 39–117)
Anion gap: 11 (ref 5–15)
BUN: 9 mg/dL (ref 6–23)
CALCIUM: 9.1 mg/dL (ref 8.4–10.5)
CO2: 27 mEq/L (ref 19–32)
Chloride: 103 mEq/L (ref 96–112)
Creatinine, Ser: 0.72 mg/dL (ref 0.50–1.10)
GFR calc Af Amer: >90 mL/min (ref >90)
GFR calc non Af Amer: >90 mL/min (ref >90)
Glucose, Bld: 121 mg/dL — ABNORMAL HIGH (ref 70–99)
Potassium: 3.8 mEq/L (ref 3.7–5.3)
SODIUM: 141 meq/L (ref 137–147)
TOTAL PROTEIN: 7.3 g/dL (ref 6.0–8.3)
Total Bilirubin: 2.2 mg/dL — ABNORMAL HIGH (ref 0.3–1.2)

## 2014-06-22 LAB — CBC
HCT: 39.2 % (ref 36.0–46.0)
Hemoglobin: 14.1 g/dL (ref 12.0–15.0)
MCH: 32.9 pg (ref 26.0–34.0)
MCHC: 36 g/dL (ref 30.0–36.0)
MCV: 91.4 fL (ref 78.0–100.0)
Platelets: 147 10*3/uL — ABNORMAL LOW (ref 150–400)
RBC: 4.29 MIL/uL (ref 3.87–5.11)
RDW: 13.8 % (ref 11.5–15.5)
WBC: 5.9 10*3/uL (ref 4.0–10.5)

## 2014-06-22 LAB — GLUCOSE, CAPILLARY
Glucose-Capillary: 155 mg/dL — ABNORMAL HIGH (ref 70–99)
Glucose-Capillary: 196 mg/dL — ABNORMAL HIGH (ref 70–99)
Glucose-Capillary: 257 mg/dL — ABNORMAL HIGH (ref 70–99)

## 2014-06-22 LAB — TROPONIN I
Troponin I: <0.3 ng/mL (ref <0.30)
Troponin I: <0.3 ng/mL (ref <0.30)

## 2014-06-22 LAB — LIPASE, BLOOD: LIPASE: 112 U/L — AB (ref 11–59)

## 2014-06-22 MED ORDER — LIRAGLUTIDE 18 MG/3ML ~~LOC~~ SOPN: 1.8000 mg | PEN_INJECTOR | Freq: Every day | SUBCUTANEOUS | Status: DC

## 2014-06-22 MED ORDER — LISINOPRIL 10 MG PO TABS
20.0000 mg | ORAL_TABLET | Freq: Every day | ORAL | Status: DC
  Administered 2014-06-22: 20 mg via ORAL
  Filled 2014-06-22 (×2): qty 2

## 2014-06-22 MED ORDER — ALBUTEROL SULFATE (2.5 MG/3ML) 0.083% IN NEBU
3.0000 mL | INHALATION_SOLUTION | Freq: Two times a day (BID) | RESPIRATORY_TRACT | Status: DC
  Administered 2014-06-22 – 2014-06-23 (×3): 3 mL via RESPIRATORY_TRACT
  Filled 2014-06-22 (×3): qty 3

## 2014-06-22 MED ORDER — ACETAMINOPHEN 325 MG PO TABS
650.0000 mg | ORAL_TABLET | Freq: Four times a day (QID) | ORAL | Status: DC | PRN
  Administered 2014-06-22: 650 mg via ORAL
  Filled 2014-06-22: qty 2

## 2014-06-22 MED ORDER — SODIUM CHLORIDE 0.9 % IJ SOLN: 3.0000 mL | INTRAMUSCULAR | Status: DC | PRN

## 2014-06-22 MED ORDER — INSULIN GLARGINE 100 UNIT/ML ~~LOC~~ SOLN
30.0000 [IU] | Freq: Every day | SUBCUTANEOUS | Status: DC
  Administered 2014-06-22: 30 [IU] via SUBCUTANEOUS
  Filled 2014-06-22 (×3): qty 0.3

## 2014-06-22 MED ORDER — LEVOTHYROXINE SODIUM 75 MCG PO TABS
75.0000 ug | ORAL_TABLET | Freq: Every day | ORAL | Status: DC
  Administered 2014-06-22 – 2014-06-23 (×2): 75 ug via ORAL
  Filled 2014-06-22 (×2): qty 1

## 2014-06-22 MED ORDER — FAMOTIDINE 20 MG PO TABS
40.0000 mg | ORAL_TABLET | Freq: Every day | ORAL | Status: DC
  Administered 2014-06-22 – 2014-06-23 (×2): 40 mg via ORAL
  Filled 2014-06-22 (×2): qty 2

## 2014-06-22 MED ORDER — SODIUM CHLORIDE 0.9 % IJ SOLN
3.0000 mL | Freq: Two times a day (BID) | INTRAMUSCULAR | Status: DC
  Administered 2014-06-22 (×2): 3 mL via INTRAVENOUS

## 2014-06-22 MED ORDER — KETOTIFEN FUMARATE 0.025 % OP SOLN
2.0000 [drp] | Freq: Four times a day (QID) | OPHTHALMIC | Status: DC
  Administered 2014-06-22 – 2014-06-23 (×5): 2 [drp] via OPHTHALMIC
  Filled 2014-06-22: qty 5

## 2014-06-22 MED ORDER — INSULIN ASPART 100 UNIT/ML ~~LOC~~ SOLN
0.0000 [IU] | Freq: Three times a day (TID) | SUBCUTANEOUS | Status: DC
  Administered 2014-06-22: 3 [IU] via SUBCUTANEOUS
  Administered 2014-06-22: 8 [IU] via SUBCUTANEOUS
  Administered 2014-06-22: 3 [IU] via SUBCUTANEOUS
  Administered 2014-06-23: 8 [IU] via SUBCUTANEOUS
  Administered 2014-06-23: 3 [IU] via SUBCUTANEOUS

## 2014-06-22 MED ORDER — CARVEDILOL 12.5 MG PO TABS
12.5000 mg | ORAL_TABLET | Freq: Two times a day (BID) | ORAL | Status: DC
  Administered 2014-06-22 – 2014-06-23 (×3): 12.5 mg via ORAL
  Filled 2014-06-22 (×3): qty 1

## 2014-06-22 MED ORDER — PANTOPRAZOLE SODIUM 40 MG PO TBEC
40.0000 mg | DELAYED_RELEASE_TABLET | Freq: Two times a day (BID) | ORAL | Status: DC
  Administered 2014-06-22 – 2014-06-23 (×2): 40 mg via ORAL
  Filled 2014-06-22 (×2): qty 1

## 2014-06-22 MED ORDER — ASPIRIN EC 81 MG PO TBEC
81.0000 mg | DELAYED_RELEASE_TABLET | Freq: Every day | ORAL | Status: DC
  Administered 2014-06-22 – 2014-06-23 (×2): 81 mg via ORAL
  Filled 2014-06-22 (×2): qty 1

## 2014-06-22 MED ORDER — ACETAMINOPHEN 650 MG RE SUPP: 650.0000 mg | Freq: Four times a day (QID) | RECTAL | Status: DC | PRN

## 2014-06-22 MED ORDER — ONDANSETRON HCL 4 MG/2ML IJ SOLN
4.0000 mg | Freq: Four times a day (QID) | INTRAMUSCULAR | Status: DC | PRN
  Administered 2014-06-22 (×3): 4 mg via INTRAVENOUS
  Filled 2014-06-22 (×3): qty 2

## 2014-06-22 MED ORDER — SODIUM CHLORIDE 0.9 % IV SOLN: 250.0000 mL | INTRAVENOUS | Status: DC | PRN

## 2014-06-22 MED ORDER — GABAPENTIN 100 MG PO CAPS
200.0000 mg | ORAL_CAPSULE | Freq: Every evening | ORAL | Status: DC
  Administered 2014-06-22: 200 mg via ORAL
  Filled 2014-06-22: qty 2

## 2014-06-22 MED ORDER — MOMETASONE FURO-FORMOTEROL FUM 200-5 MCG/ACT IN AERO
2.0000 | INHALATION_SPRAY | Freq: Two times a day (BID) | RESPIRATORY_TRACT | Status: DC
  Administered 2014-06-22 – 2014-06-23 (×3): 2 via RESPIRATORY_TRACT
  Filled 2014-06-22: qty 8.8

## 2014-06-22 MED ORDER — PANTOPRAZOLE SODIUM 40 MG PO TBEC
40.0000 mg | DELAYED_RELEASE_TABLET | Freq: Every day | ORAL | Status: DC
  Administered 2014-06-22: 40 mg via ORAL
  Filled 2014-06-22: qty 1

## 2014-06-22 MED ORDER — MORPHINE SULFATE 2 MG/ML IJ SOLN
2.0000 mg | INTRAMUSCULAR | Status: DC | PRN
  Administered 2014-06-22 (×3): 2 mg via INTRAVENOUS
  Filled 2014-06-22 (×3): qty 1

## 2014-06-22 MED ORDER — ENOXAPARIN SODIUM 40 MG/0.4ML ~~LOC~~ SOLN
40.0000 mg | SUBCUTANEOUS | Status: DC
  Administered 2014-06-22 – 2014-06-23 (×2): 40 mg via SUBCUTANEOUS
  Filled 2014-06-22 (×2): qty 0.4

## 2014-06-22 MED ORDER — MOMETASONE FURO-FORMOTEROL FUM 200-5 MCG/ACT IN AERO
INHALATION_SPRAY | RESPIRATORY_TRACT | Status: AC
  Filled 2014-06-22: qty 8.8

## 2014-06-22 MED ORDER — ONDANSETRON HCL 4 MG PO TABS: 4.0000 mg | ORAL_TABLET | Freq: Four times a day (QID) | ORAL | Status: DC | PRN

## 2014-06-22 MED ORDER — GABAPENTIN 100 MG PO CAPS: 100.0000 mg | ORAL_CAPSULE | Freq: Every evening | ORAL | Status: DC

## 2014-06-22 MED ORDER — ROPINIROLE HCL 1 MG PO TABS
1.0000 mg | ORAL_TABLET | Freq: Every day | ORAL | Status: DC
  Administered 2014-06-22: 1 mg via ORAL
  Filled 2014-06-22 (×3): qty 1

## 2014-06-22 MED ORDER — FLUCONAZOLE 100 MG PO TABS
100.0000 mg | ORAL_TABLET | Freq: Every day | ORAL | Status: DC
  Administered 2014-06-22 – 2014-06-23 (×2): 100 mg via ORAL
  Filled 2014-06-22 (×2): qty 1

## 2014-06-22 MED ORDER — POLYETHYLENE GLYCOL 3350 17 G PO PACK
17.0000 g | PACK | Freq: Two times a day (BID) | ORAL | Status: AC
  Administered 2014-06-22 (×2): 17 g via ORAL
  Filled 2014-06-22 (×2): qty 1

## 2014-06-22 NOTE — Progress Notes (Signed)
06/22/14 1219 Notified Dr. David StallFeliz-Ortiz that MRI of lumbar spine ordered would not be completed over the weekend due to not having MRI here. Stated okay, could be obtained as outpatient. Earnstine RegalAshley Izaah Westman, RN

## 2014-06-22 NOTE — Progress Notes (Signed)
[facility] PROGRESS NOTE Interim History: 58 year old female who has a past medical history of CAD nonobstructive coronary artery disease by catheterization 2008  Presents to the ED with chief complaint of chest pain that started tonight at home when patient was visiting her brother. The pain was located on the left side with radiation to the left arm also exertion made the pain worse.   Assessment/Plan: Chest pain - Heart score of 4. Cardiac markers negative x3. - echo pending. - chest wall tender to palpation.  Diabetes Mellitus: - cont lantus plus SSI.  Vaginal discharge: - Cultures have been obtained by the ED physician  - Will need follow up with GYN as outpatient  Chronic back pain: - Patient has spinal stenosis and takes Vicodin when necessary at home. - Problem with Moving Bowel, some bladder incontinence, check MRI of lumbar spine.  Obstructive sleep apnea: Patient uses CPAP at bedtime  HYPERTENSION, UNSPECIFIED - stable cont home meds    Code Status: full Family Communication: none  Disposition Plan: observation   Consultants:  none  Procedures:  Echo  MRI of lumbar spine  Antibiotics:  none   HPI/Subjective: Pt with all sort of complain.  Objective: Filed Vitals:   06/21/14 2330 06/22/14 0148 06/22/14 0641 06/22/14 0802  BP: 149/75 122/62 124/56   Pulse: 73 66 66   Temp:  97.9 F (36.6 C) 97.8 F (36.6 C)   TempSrc:  Oral Oral   Resp: 22 18 18    Height:      Weight:      SpO2:  100% 95% 95%    Intake/Output Summary (Last 24 hours) at 06/22/14 0946 Last data filed at 06/22/14 0809  Gross per 24 hour  Intake      3 ml  Output      0 ml  Net      3 ml   Filed Weights   06/21/14 2126  Weight: 115.214 kg (254 lb)    Exam:  General: Alert, awake, oriented x3, in no acute distress.  HEENT: No bruits, no goiter.  Heart: Regular rate and rhythm, without murmurs, rubs, gallops.  Lungs: Good air movement, bilateral air  movement.  Abdomen: Soft, nontender, nondistended, positive bowel sounds.  Neuro: Grossly intact, nonfocal.   Data Reviewed: Basic Metabolic Panel:  Recent Labs Lab 06/21/14 2205 06/22/14 0133  NA 140 141  K 4.0 3.8  CL 101 103  CO2 29 27  GLUCOSE 203* 121*  BUN 9 9  CREATININE 0.72 0.72  CALCIUM 9.1 9.1   Liver Function Tests:  Recent Labs Lab 06/21/14 2205 06/22/14 0133  AST 35 40*  ALT 21 22  ALKPHOS 108 107  BILITOT 2.0* 2.2*  PROT 7.6 7.3  ALBUMIN 3.8 3.7    Recent Labs Lab 06/21/14 2205 06/22/14 0133  LIPASE 88* 112*   No results found for this basename: AMMONIA,  in the last 168 hours CBC:  Recent Labs Lab 06/21/14 2205 06/22/14 0133  WBC 5.1 5.9  NEUTROABS 2.4  --   HGB 14.0 14.1  HCT 38.9 39.2  MCV 91.3 91.4  PLT 147* 147*   Cardiac Enzymes:  Recent Labs Lab 06/21/14 2205 06/22/14 0106 06/22/14 0700  TROPONINI <0.30 <0.30 <0.30   BNP (last 3 results) No results found for this basename: PROBNP,  in the last 8760 hours CBG:  Recent Labs Lab 06/22/14 0726  GLUCAP 155*    No results found for this or any previous visit (from the past 240  hour(s)).   Studies: Dg Chest 2 View  06/21/2014   CLINICAL DATA:  Chest pain and shortness of breath.  EXAM: CHEST  2 VIEW  COMPARISON:  12/11/2013  FINDINGS: The heart size and mediastinal contours are within normal limits. Both lungs are clear. The visualized skeletal structures are unremarkable.  IMPRESSION: No active cardiopulmonary disease.   Electronically Signed   By: Lucienne Capers M.D.   On: 06/21/2014 22:31    Scheduled Meds: . albuterol  3 mL Inhalation BID  . aspirin EC  81 mg Oral Daily  . carvedilol  12.5 mg Oral BID WC  . enoxaparin (LOVENOX) injection  40 mg Subcutaneous Q24H  . famotidine  40 mg Oral Daily  . gabapentin  100 mg Oral QPM  . insulin aspart  0-15 Units Subcutaneous TID WC  . insulin glargine  30 Units Subcutaneous QHS  . ketotifen  2 drop Both Eyes QID  .  levothyroxine  75 mcg Oral QAC breakfast  . Liraglutide  1.8 mg Subcutaneous Daily  . lisinopril  20 mg Oral Daily  . mometasone-formoterol  2 puff Inhalation BID  . pantoprazole  40 mg Oral Daily  . rOPINIRole  1 mg Oral QHS  . sodium chloride  3 mL Intravenous Q12H   Continuous Infusions:    Charlynne Cousins  [facility] Pager [phone]. If 8PM-8AM, please contact night-coverage at [email or web address], password [facility] 06/22/2014, 9:46 AM  LOS: 1 day      **Disclaimer: This note may have been dictated with voice recognition software. Similar sounding words can inadvertently be transcribed and this note may contain transcription errors which may not have been corrected upon publication of note.**

## 2014-06-22 NOTE — H&P (Signed)
PCP:   Glenda Chroman., MD   Chief Complaint:  Chest pain  HPI: 58 year old female who   has a past medical history of CAD (coronary artery disease); Heart failure, diastolic, chronic; GERD (gastroesophageal reflux disease); Morbid obesity; Obstructive sleep apnea; IDDM (insulin dependent diabetes mellitus); HTN (hypertension); Shortness of breath; Asthma; Spinal stenosis; Chronic back pain; Arthritis; Degenerative joint disease; and Glaucoma. Presents to the ED with chief complaint of chest pain that started tonight at home when patient was visiting her brother. The pain was located on the left side with radiation to the left arm also exertion made the pain worse. She denies shortness of breath has nausea but no vomiting. No history of MI in the past. Patient does have a history of stroke. Patient also complains of a vaginal boil with burning on urination and vaginal bleeding which has been going on for one week. At this time the pain has improved, in the ED cardiac enzymes are negative. EKG showed normal sinus rhythm. Vaginal cultures have been obtained by the ED physician.  Allergies:   Allergies  Allergen Reactions  . Naproxen Anaphylaxis    REACTION: throat swelling  . Other     Local anesthia Had asthma attack  . Sulfonamide Derivatives     Yeast infection      Past Medical History  Diagnosis Date  . CAD (coronary artery disease)     nonobstructive by cath, 6/08 (false positive Cardiolite) normal stress echo, 7/11  . Heart failure, diastolic, chronic   . GERD (gastroesophageal reflux disease)   . Morbid obesity   . Obstructive sleep apnea   . IDDM (insulin dependent diabetes mellitus)   . HTN (hypertension)   . Shortness of breath   . Asthma   . Spinal stenosis   . Chronic back pain   . Arthritis   . Degenerative joint disease   . Glaucoma     Past Surgical History  Procedure Laterality Date  . Eye surgery  1967  . Tubal ligation  1975  . Partial hysterectomy   1981  . Hemorrhoid surgery    . Hernia repair    . Spinahatomy  1992  . Cholecystectomy  1994  . Ovaries removed  2003  . Heel (other)  2005  . Abdominal hysterectomy    . Bilateral knee arthroscopy  2000, 2009  . Colonoscopy N/A 08/01/2013    Procedure: COLONOSCOPY;  Surgeon: Rogene Houston, MD;  Location: [facility];  Service: Endoscopy;  Laterality: N/A;  930    Prior to Admission medications   Medication Sig Start Date End Date Taking? Authorizing Provider  albuterol (VENTOLIN HFA) 108 (90 BASE) MCG/ACT inhaler Inhale 2 puffs into the lungs 2 (two) times daily.   Yes Historical Provider, MD  aspirin EC 81 MG tablet Take 81 mg by mouth daily.   Yes Historical Provider, MD  carvedilol (COREG) 12.5 MG tablet Take 12.5 mg by mouth 2 (two) times daily with a meal.   Yes Historical Provider, MD  famotidine (PEPCID) 40 MG tablet Take 40 mg by mouth daily.   Yes Historical Provider, MD  Fluticasone-Salmeterol (ADVAIR DISKUS) 500-50 MCG/DOSE AEPB Inhale 2 puffs into the lungs 2 (two) times daily.    Yes Historical Provider, MD  furosemide (LASIX) 20 MG tablet Take 20-40 mg by mouth daily as needed for fluid.   Yes Historical Provider, MD  gabapentin (NEURONTIN) 100 MG capsule Take 100 mg by mouth every evening.   Yes Historical Provider, MD  HYDROcodone-acetaminophen (NORCO) 7.5-325 MG per tablet Take 1 tablet by mouth daily as needed for moderate pain.   Yes Historical Provider, MD  insulin aspart (NOVOLOG FLEXPEN) 100 UNIT/ML injection Inject 10-15 Units into the skin 2 (two) times daily. 10 units at lunch, 15 units at supper. Then sliding scale as needed.   Yes Historical Provider, MD  insulin glargine (LANTUS SOLOSTAR) 100 UNIT/ML injection Inject 30 Units into the skin at bedtime.    Yes Historical Provider, MD  ipratropium-albuterol (DUONEB) 0.5-2.5 (3) MG/3ML SOLN Take 3 mLs by nebulization daily as needed (for shortness of breath).    Yes Historical Provider, MD  ketotifen (THERA  TEARS ALLERGY) 0.025 % ophthalmic solution Place 2 drops into both eyes 4 (four) times daily. *Uses drops 2 to 4 times daily*   Yes Historical Provider, MD  Levothyroxine Sodium 75 MCG CAPS Take 1 capsule by mouth daily.    Yes Historical Provider, MD  Liraglutide (VICTOZA) 18 MG/3ML SOPN Inject 1.8 mg into the skin daily.   Yes Historical Provider, MD  lisinopril (PRINIVIL,ZESTRIL) 20 MG tablet Take 20 mg by mouth daily.   Yes Historical Provider, MD  OXYGEN-HELIUM IN Inhale 2.5 L into the lungs at bedtime. OXYGEN 2L: use as directed with cpap   Yes Historical Provider, MD  pantoprazole (PROTONIX) 40 MG tablet Take 40 mg by mouth daily.    Yes Historical Provider, MD  rOPINIRole (REQUIP) 1 MG tablet Take 1 mg by mouth at bedtime.   Yes Historical Provider, MD  NON FORMULARY CPAP: use as directed      Historical Provider, MD    Social History:  reports that she has never smoked. She has never used smokeless tobacco. She reports that she does not drink alcohol or use illicit drugs.  Family History  Problem Relation Age of Onset  . Cancer Other   . Heart failure Other   . Colon cancer Brother      All the positives are listed in BOLD  Review of Systems:  HEENT: Headache, blurred vision, runny nose, sore throat Neck: Hypothyroidism, hyperthyroidism,,lymphadenopathy Chest : Shortness of breath, history of COPD, Asthma Heart : Chest pain, history of coronary arterey disease GI:  Nausea, vomiting, diarrhea, constipation, GERD GU: Dysuria, urgency, frequency of urination, hematuria Neuro: Stroke, seizures, syncope Psych: Depression, anxiety, hallucinations   Physical Exam: Blood pressure 149/75, pulse 73, temperature 97.5 F (36.4 C), temperature source Oral, resp. rate 22, height 5' (1.524 m), weight 115.214 kg (254 lb), SpO2 100.00%. Constitutional:   Patient is a well-developed and well-nourished female in in no acute distress and cooperative with exam. Head: Normocephalic and  atraumatic Mouth: Mucus membranes moist Eyes: PERRL, EOMI, conjunctivae normal Neck: Supple, No Thyromegaly Cardiovascular: RRR, S1 normal, S2 normal Pulmonary/Chest: CTAB, no wheezes, rales, or rhonchi Abdominal: Soft. Non-tender, non-distended, bowel sounds are normal, no masses, organomegaly, or guarding present.  Neurological: A&O x3, Strenght is normal and symmetric bilaterally, cranial nerve II-XII are grossly intact, no focal motor deficit, sensory intact to light touch bilaterally.  Extremities : No Cyanosis, Clubbing or Edema  Labs on Admission:  Basic Metabolic Panel:  Recent Labs Lab 06/21/14 2205  NA 140  K 4.0  CL 101  CO2 29  GLUCOSE 203*  BUN 9  CREATININE 0.72  CALCIUM 9.1   Liver Function Tests:  Recent Labs Lab 06/21/14 2205  AST 35  ALT 21  ALKPHOS 108  BILITOT 2.0*  PROT 7.6  ALBUMIN 3.8    Recent Labs Lab  06/21/14 2205  LIPASE 88*   No results found for this basename: AMMONIA,  in the last 168 hours CBC:  Recent Labs Lab 06/21/14 2205  WBC 5.1  NEUTROABS 2.4  HGB 14.0  HCT 38.9  MCV 91.3  PLT 147*   Cardiac Enzymes:  Recent Labs Lab 06/21/14 2205  TROPONINI <0.30     Radiological Exams on Admission: Dg Chest 2 View  06/21/2014   CLINICAL DATA:  Chest pain and shortness of breath.  EXAM: CHEST  2 VIEW  COMPARISON:  12/11/2013  FINDINGS: The heart size and mediastinal contours are within normal limits. Both lungs are clear. The visualized skeletal structures are unremarkable.  IMPRESSION: No active cardiopulmonary disease.   Electronically Signed   By: Lucienne Capers M.D.   On: 06/21/2014 22:31    EKG: Independently reviewed. Normal sinus rhythm   Assessment/Plan Principal Problem:   Chest pain Active Problems:   IDDM   HYPERTENSION, UNSPECIFIED   CAD   Spinal stenosis   Obstructive sleep apnea   Vaginal discharge  Chest pain We'll admit the patient in telemetry , obtain serial cardiac enzymes.  Patient does  have risk factors for CAD including diabetes, hypertension, obesity. We'll also obtain 2-D echocardiogram in a.m.  Diabetes mellitus Continue Lantus 30 units at bedtime We'll start sliding scale insulin with NovoLog  Vaginal discharge Cultures have been obtained by the ED physician Will need follow up with GYN as outpatient Patient also complained of dysuria, UA negative for infection.  Chronic back pain Patient has spinal stenosis and takes Vicodin when necessary at home Will continue with morphine 2 mg IV every 4 hours when necessary  Obstructive sleep apnea Patient uses CPAP at bedtime. Will use CPAP at bedtime  Code status: The  Family discussion: Discussed with family member at bedside.   Time Spent on Admission: 60 minutes  [facility] Pager: [phone] 06/22/2014, 12:32 AM  If 7PM-7AM, please contact night-coverage  [email or web address]  Password [email or web address]

## 2014-06-22 NOTE — Progress Notes (Signed)
  Echocardiogram 2D Echocardiogram has been performed.  Nestor RampRoberts, [place] Mort M 06/22/2014, 10:53 AM

## 2014-06-23 LAB — GLUCOSE, CAPILLARY
GLUCOSE-CAPILLARY: 298 mg/dL — AB (ref 70–99)
GLUCOSE-CAPILLARY: 300 mg/dL — AB (ref 70–99)
Glucose-Capillary: 198 mg/dL — ABNORMAL HIGH (ref 70–99)

## 2014-06-23 MED ORDER — FLUCONAZOLE 100 MG PO TABS: 100.0000 mg | ORAL_TABLET | Freq: Every day | ORAL | Status: DC

## 2014-06-23 MED ORDER — PANTOPRAZOLE SODIUM 40 MG PO TBEC
40.0000 mg | DELAYED_RELEASE_TABLET | Freq: Two times a day (BID) | ORAL | Status: DC
Start: 2014-06-23 — End: 2016-08-04

## 2014-06-23 NOTE — Progress Notes (Signed)
Pt given AVS handout and verbalized understanding of discharge instructions. Pt IV was discontinued and the site was intact with minimal bleeding. Pt escorted out by NT and husband. Pt was in stable condition prior to discharge. Roselie AwkwardGrissom,Ivey Cina A RN 06/23/2014 12:16 PM

## 2014-06-23 NOTE — Progress Notes (Signed)
Patient is going to wear just her 2lpm [place] tonight after she stated" fighting with the mask the night before." She is wearing a cannula with no problem at this time.

## 2014-06-23 NOTE — Discharge Instructions (Signed)

## 2014-06-23 NOTE — Discharge Summary (Signed)
Physician Discharge Summary  BAKER KOGLER ZOX:[id number] DOB: Aug 18, 1956 DOA: 06/21/2014  PCP: Glenda Chroman., MD  Admit date: 06/21/2014 Discharge date: 06/23/2014  Time spent: 35 minutes  Recommendations for Outpatient Follow-up:  1. Follow up with Cardiology as an outpatient. For nuclear stress test.  Discharge Diagnoses:  Principal Problem:   Chest pain Active Problems:   IDDM   HYPERTENSION, UNSPECIFIED   CAD   Spinal stenosis   Obstructive sleep apnea   Vaginal discharge   Discharge Condition: stable  Diet recommendation: Carb modified  Filed Weights   06/21/14 2126  Weight: 115.214 kg (254 lb)    History of present illness:  58 year old [name] with past medical history of coronary artery disease chronic diastolic heart failure and GERD presents to the ED complaining of left-sided chest pain with mild exertion.  Hospital Course:  Chest pain  - Heart score of 4. Cardiac markers negative x3.  - echo [id number]: Systolic function was normal. The estimated ejection fraction was in the range of 60% to 65%. Wall motion was normal; there were no regional wall motion abnormalities. - Left atrium: The atrium was moderately dilated. - Right ventricle: The cavity size was mildly dilated. Wall thickness was normal. - Right atrium: The atrium was mildly dilated. - chest wall tender to palpation.  - Followup with cardiology for nuclear stress as an outpatient.  Diabetes Mellitus:  - cont lantus plus SSI.   Vaginal discharge:  - Cultures have been obtained by the ED physician  - Will need follow up with GYN as outpatient  - Start empiric treatment with Diflucan as she is having a frothy white discharge.  Chronic back pain:  - Patient has spinal stenosis and takes Vicodin when necessary at home.  - Problem with Moving Bowel at home was started on MiraLAX he had one bowel movement. - Continue MiraLAX at home.  Obstructive sleep apnea:  Patient uses CPAP at bedtime    HYPERTENSION, UNSPECIFIED  - stable cont home meds   Procedures:  ECHO as above.  Consultations:  none  Discharge Exam: Filed Vitals:   06/23/14 1010  BP: 111/47  Pulse: 64  Temp:   Resp:     General: A&O x3 Cardiovascular: RRR Respiratory: good air movement CTA B/L  Discharge Instructions You were cared for by a hospitalist during your hospital stay. If you have any questions about your discharge medications or the care you received while you were in the hospital after you are discharged, you can call the unit and asked to speak with the hospitalist on call if the hospitalist that took care of you is not available. Once you are discharged, your primary care physician will handle any further medical issues. Please note that NO REFILLS for any discharge medications will be authorized once you are discharged, as it is imperative that you return to your primary care physician (or establish a relationship with a primary care physician if you do not have one) for your aftercare needs so that they can reassess your need for medications and monitor your lab values.      Discharge Instructions   Diet - low sodium heart healthy    Complete by:  As directed      Increase activity slowly    Complete by:  As directed             Medication List         ADVAIR DISKUS 500-50 MCG/DOSE Aepb  Generic drug:  Fluticasone-Salmeterol  Inhale 2  puffs into the lungs 2 (two) times daily.     aspirin EC 81 MG tablet  Take 81 mg by mouth daily.     carvedilol 12.5 MG tablet  Commonly known as:  COREG  Take 12.5 mg by mouth 2 (two) times daily with a meal.     DUONEB 0.5-2.5 (3) MG/3ML Soln  Generic drug:  ipratropium-albuterol  Take 3 mLs by nebulization daily as needed (for shortness of breath).     famotidine 40 MG tablet  Commonly known as:  PEPCID  Take 40 mg by mouth daily.     fluconazole 100 MG tablet  Commonly known as:  DIFLUCAN  Take 1 tablet (100 mg total) by mouth  daily.     furosemide 20 MG tablet  Commonly known as:  LASIX  Take 20-40 mg by mouth daily as needed for fluid.     gabapentin 100 MG capsule  Commonly known as:  NEURONTIN  Take 100 mg by mouth every evening.     HYDROcodone-acetaminophen 7.5-325 MG per tablet  Commonly known as:  NORCO  Take 1 tablet by mouth daily as needed for moderate pain.     LANTUS SOLOSTAR 100 UNIT/ML injection  Generic drug:  insulin glargine  Inject 30 Units into the skin at bedtime.     Levothyroxine Sodium 75 MCG Caps  Take 1 capsule by mouth daily.     lisinopril 20 MG tablet  Commonly known as:  PRINIVIL,ZESTRIL  Take 20 mg by mouth daily.     NON FORMULARY  - CPAP: use as directed  -      NOVOLOG FLEXPEN 100 UNIT/ML injection  Generic drug:  insulin aspart  Inject 10-15 Units into the skin 2 (two) times daily. 10 units at lunch, 15 units at supper. Then sliding scale as needed.     OXYGEN-HELIUM IN  Inhale 2.5 L into the lungs at bedtime. OXYGEN 2L: use as directed with cpap     pantoprazole 40 MG tablet  Commonly known as:  PROTONIX  Take 1 tablet (40 mg total) by mouth 2 (two) times daily.     rOPINIRole 1 MG tablet  Commonly known as:  REQUIP  Take 1 mg by mouth at bedtime.     THERA TEARS ALLERGY 0.025 % ophthalmic solution  Generic drug:  ketotifen  Place 2 drops into both eyes 4 (four) times daily. *Uses drops 2 to 4 times daily*     VENTOLIN HFA 108 (90 BASE) MCG/ACT inhaler  Generic drug:  albuterol  Inhale 2 puffs into the lungs 2 (two) times daily.     VICTOZA 18 MG/3ML Sopn  Generic drug:  Liraglutide  Inject 1.8 mg into the skin daily.       Allergies  Allergen Reactions  . Naproxen Anaphylaxis    REACTION: throat swelling  . Other     Local anesthia Had asthma attack  . Sulfonamide Derivatives     Yeast infection      The results of significant diagnostics from this hospitalization (including imaging, microbiology, ancillary and laboratory) are  listed below for reference.    Significant Diagnostic Studies: Dg Chest 2 View  06/21/2014   CLINICAL DATA:  Chest pain and shortness of breath.  EXAM: CHEST  2 VIEW  COMPARISON:  12/11/2013  FINDINGS: The heart size and mediastinal contours are within normal limits. Both lungs are clear. The visualized skeletal structures are unremarkable.  IMPRESSION: No active cardiopulmonary disease.   Electronically Signed  By: Lucienne Capers M.D.   On: 06/21/2014 22:31    Microbiology: No results found for this or any previous visit (from the past 240 hour(s)).   Labs: Basic Metabolic Panel:  Recent Labs Lab 06/21/14 2205 06/22/14 0133  NA 140 141  K 4.0 3.8  CL 101 103  CO2 Kimberly 27  GLUCOSE 203* 121*  BUN 9 9  CREATININE 0.72 0.72  CALCIUM 9.1 9.1   Liver Function Tests:  Recent Labs Lab 06/21/14 2205 06/22/14 0133  AST 35 40*  ALT 21 22  ALKPHOS 108 107  BILITOT 2.0* 2.2*  PROT 7.6 7.3  ALBUMIN 3.8 3.7    Recent Labs Lab 06/21/14 2205 06/22/14 0133  LIPASE 88* 112*   No results found for this basename: AMMONIA,  in the last 168 hours CBC:  Recent Labs Lab 06/21/14 2205 06/22/14 0133  WBC 5.1 5.9  NEUTROABS 2.4  --   HGB 14.0 14.1  HCT 38.9 39.2  MCV 91.3 91.4  PLT 147* 147*   Cardiac Enzymes:  Recent Labs Lab 06/21/14 2205 06/22/14 0106 06/22/14 0700 06/22/14 1251  TROPONINI <0.30 <0.30 <0.30 <0.30   BNP: BNP (last 3 results) No results found for this basename: PROBNP,  in the last 8760 hours CBG:  Recent Labs Lab 06/22/14 0726 06/22/14 1140 06/22/14 1640 06/22/14 2130 06/23/14 0743  GLUCAP 155* 196* 257* 298* 198*     Signed:  Aileen Fass, Brasen Bundren  [facility] 06/23/2014, 10:46 AM

## 2014-06-24 LAB — HERPES SIMPLEX VIRUS CULTURE: CULTURE: NOT DETECTED

## 2014-06-28 ENCOUNTER — Ambulatory Visit (INDEPENDENT_AMBULATORY_CARE_PROVIDER_SITE_OTHER): Payer: Medicare Other | Admitting: Internal Medicine

## 2014-06-28 ENCOUNTER — Encounter: Payer: Self-pay | Admitting: Internal Medicine

## 2014-06-28 ENCOUNTER — Encounter: Payer: Self-pay | Admitting: *Deleted

## 2014-06-28 VITALS — BP 118/74 | HR 71 | Ht 60.0 in | Wt 240.0 lb

## 2014-06-28 DIAGNOSIS — R079 Chest pain, unspecified: Secondary | ICD-10-CM

## 2014-06-28 DIAGNOSIS — R0602 Shortness of breath: Secondary | ICD-10-CM

## 2014-06-28 NOTE — Progress Notes (Signed)
HPI Patient is a 58 yo who has a history of CP and nonobstructive coronary artery disease by catheterization 2008. She also hs history of HTN, DM, GERD, sleep apnea. Asthma. Spinal stenosis.   Stress echo in 2012 was normal She was recently admitted with CP to [facility]  Wnt to ED with CP that began at rest  L sided with L arm pain.  Worse with exertion.  She r/o fro MI  Pain is always there  Doesn't go away  Increased when went to room   SInce d/c it hs eased off  Even walking in howse gets twinges  Does have GERD  Does have some heartburn  Has more nausea  Food feels like gets stuck  Spurs pushing into esophagus   Nomral motility in 2013.   Also with spinal stenosis   Had CVA in January  [place]  CT sugg of ischemia but MRI negative  Carotid USN negative.  Echo with normal LV and RV function.     Allergies  Allergen Reactions  . Naproxen Anaphylaxis    REACTION: throat swelling  . Other     Local anesthia Had asthma attack  . Sulfonamide Derivatives     Yeast infection    Current Outpatient Prescriptions  Medication Sig Dispense Refill  . albuterol (VENTOLIN HFA) 108 (90 BASE) MCG/ACT inhaler Inhale 2 puffs into the lungs 2 (two) times daily.      Marland Kitchen aspirin EC 81 MG tablet Take 81 mg by mouth daily.      . carvedilol (COREG) 12.5 MG tablet Take 12.5 mg by mouth 2 (two) times daily with a meal.      . famotidine (PEPCID) 40 MG tablet Take 40 mg by mouth daily.      . fluconazole (DIFLUCAN) 100 MG tablet Take 1 tablet (100 mg total) by mouth daily.  7 tablet  0  . Fluticasone-Salmeterol (ADVAIR DISKUS) 500-50 MCG/DOSE AEPB Inhale 2 puffs into the lungs 2 (two) times daily.       . furosemide (LASIX) 20 MG tablet Take 20-40 mg by mouth daily as needed for fluid.      Marland Kitchen gabapentin (NEURONTIN) 100 MG capsule Take 100 mg by mouth every evening.      Marland Kitchen HYDROcodone-acetaminophen (NORCO) 7.5-325 MG per tablet Take 1 tablet by mouth daily as needed for moderate pain.      Marland Kitchen insulin aspart  (NOVOLOG FLEXPEN) 100 UNIT/ML injection Inject 10-15 Units into the skin 2 (two) times daily. 10 units at lunch, 15 units at supper. Then sliding scale as needed.      . insulin glargine (LANTUS SOLOSTAR) 100 UNIT/ML injection Inject 30 Units into the skin at bedtime.       Marland Kitchen ipratropium-albuterol (DUONEB) 0.5-2.5 (3) MG/3ML SOLN Take 3 mLs by nebulization daily as needed (for shortness of breath).       Marland Kitchen ketotifen (THERA TEARS ALLERGY) 0.025 % ophthalmic solution Place 2 drops into both eyes 4 (four) times daily. *Uses drops 2 to 4 times daily*      . Levothyroxine Sodium 75 MCG CAPS Take 1 capsule by mouth daily.       . Liraglutide (VICTOZA) 18 MG/3ML SOPN Inject 1.8 mg into the skin daily.      Marland Kitchen lisinopril (PRINIVIL,ZESTRIL) 20 MG tablet Take 20 mg by mouth daily.      . NON FORMULARY CPAP: use as directed        . OXYGEN-HELIUM IN Inhale 2.5 L into the lungs at  bedtime. OXYGEN 2L: use as directed with cpap      . pantoprazole (PROTONIX) 40 MG tablet Take 1 tablet (40 mg total) by mouth 2 (two) times daily.  60 tablet  0  . rOPINIRole (REQUIP) 1 MG tablet Take 1 mg by mouth at bedtime.       No current facility-administered medications for this visit.    Past Medical History  Diagnosis Date  . CAD (coronary artery disease)     nonobstructive by cath, 6/08 (false positive Cardiolite) normal stress echo, 7/11  . Heart failure, diastolic, chronic   . GERD (gastroesophageal reflux disease)   . Morbid obesity   . Obstructive sleep apnea   . IDDM (insulin dependent diabetes mellitus)   . HTN (hypertension)   . Shortness of breath   . Asthma   . Spinal stenosis   . Chronic back pain   . Arthritis   . Degenerative joint disease   . Glaucoma     Past Surgical History  Procedure Laterality Date  . Eye surgery  1967  . Tubal ligation  1975  . Partial hysterectomy  1981  . Hemorrhoid surgery    . Hernia repair    . Spinahatomy  1992  . Cholecystectomy  1994  . Ovaries removed   2003  . Heel (other)  2005  . Abdominal hysterectomy    . Bilateral knee arthroscopy  2000, 2009  . Colonoscopy N/A 08/01/2013    Procedure: COLONOSCOPY;  Surgeon: Rogene Houston, MD;  Location: [facility];  Service: Endoscopy;  Laterality: N/A;  930    Family History  Problem Relation Age of Onset  . Cancer Other   . Heart failure Other   . Colon cancer Brother     History   Social History  . Marital Status: Married    Spouse Name: N/A    Number of Children: N/A  . Years of Education: N/A   Occupational History  . Not on file.   Social History Main Topics  . Smoking status: Never Smoker   . Smokeless tobacco: Never Used  . Alcohol Use: No  . Drug Use: No  . Sexual Activity: Not on file   Other Topics Concern  . Not on file   Social History Narrative   Regularly exercises. Full Time.     Review of Systems:  All systems reviewed.  They are negative to the above problem except as previously stated.  Vital Signs: BP 118/74  Pulse 71  Ht 5' (1.524 m)  Wt 240 lb (108.863 kg)  BMI 46.87 kg/m2  Physical Exam Patient is a morbidl obese   NAD   HEENT:  Normocephalic, atraumatic. EOMI, PERRLA.  Neck: JVP is difficult to assess.  No bruits.  Lungs: clear to auscultation. No rales no wheezes.  Heart: Regular rate and rhythm. Normal S1, S2. No S3.   No significant murmurs. PMI not displaced.  Abdomen:  Supple, nontender. Normal bowel sounds. No masses. No hepatomegaly.  Extremities:   Good distal pulses throughout. Tr   lower extremity edema.  Musculoskeletal :moving all extremities.  Neuro:   alert and oriented x3.  CN II-XII grossly intact.   Assessment and Plan: Patient is a 58 yo with hx of mild nonobstructive CAD  Admitted with CP  Pain is somewhat atypical  R/O for MI Will plan lexiscan MIBI  If neg consider amlodipine for small vessel dz  2.  Hx of CVA  Keep on current regimen

## 2014-06-28 NOTE — Patient Instructions (Signed)
Your physician recommends that you schedule a follow-up appointment in: 3 months  Your physician has requested that you have a lexiscan myoview. For further information please visit [email or web address]. Please follow instruction sheet, as given.

## 2014-07-03 ENCOUNTER — Encounter (HOSPITAL_COMMUNITY): Payer: Medicare Other

## 2014-07-03 ENCOUNTER — Encounter (HOSPITAL_COMMUNITY): Payer: Self-pay

## 2014-07-03 ENCOUNTER — Ambulatory Visit (HOSPITAL_COMMUNITY): Payer: Medicare Other

## 2014-07-03 ENCOUNTER — Encounter (HOSPITAL_COMMUNITY)
Admission: RE | Admit: 2014-07-03 | Discharge: 2014-07-03 | Disposition: A | Discharge status: Home / Self Care | Payer: Medicare Other | Source: Ambulatory Visit | Attending: Internal Medicine | Admitting: Internal Medicine

## 2014-07-03 ENCOUNTER — Ambulatory Visit (HOSPITAL_COMMUNITY)
Admission: RE | Admit: 2014-07-03 | Discharge: 2014-07-03 | Disposition: A | Discharge status: Home / Self Care | Payer: Medicare Other | Source: Ambulatory Visit | Attending: Internal Medicine | Admitting: Internal Medicine

## 2014-07-03 DIAGNOSIS — R079 Chest pain, unspecified: Secondary | ICD-10-CM | POA: Insufficient documentation

## 2014-07-03 DIAGNOSIS — I2510 Atherosclerotic heart disease of native coronary artery without angina pectoris: Secondary | ICD-10-CM | POA: Insufficient documentation

## 2014-07-03 DIAGNOSIS — R0602 Shortness of breath: Secondary | ICD-10-CM | POA: Insufficient documentation

## 2014-07-03 HISTORY — DX: Heart failure, unspecified: I50.9

## 2014-07-03 MED ORDER — TECHNETIUM TC 99M SESTAMIBI - CARDIOLITE
10.0000 | Freq: Once | INTRAVENOUS | Status: AC | PRN
  Administered 2014-07-03: 10 via INTRAVENOUS

## 2014-07-03 MED ORDER — SODIUM CHLORIDE 0.9 % IJ SOLN
INTRAMUSCULAR | Status: DC
Start: 2014-07-03 — End: 2014-07-09
  Filled 2014-07-03: qty 18

## 2014-07-03 MED ORDER — SODIUM CHLORIDE 0.9 % IJ SOLN
INTRAMUSCULAR | Status: AC
  Administered 2014-07-03: 10 mL via INTRAVENOUS
  Filled 2014-07-03: qty 10

## 2014-07-03 MED ORDER — REGADENOSON 0.4 MG/5ML IV SOLN
INTRAVENOUS | Status: AC
  Administered 2014-07-03: 0.4 mg via INTRAVENOUS
  Filled 2014-07-03: qty 5

## 2014-07-03 MED ORDER — SODIUM CHLORIDE 0.9 % IJ SOLN
10.0000 mL | INTRAMUSCULAR | Status: DC | PRN
  Administered 2014-07-03: 10 mL via INTRAVENOUS

## 2014-07-03 MED ORDER — REGADENOSON 0.4 MG/5ML IV SOLN
0.4000 mg | Freq: Once | INTRAVENOUS | Status: AC | PRN
Start: 2014-07-03 — End: 2014-07-03
  Administered 2014-07-03: 0.4 mg via INTRAVENOUS

## 2014-07-03 MED ORDER — TECHNETIUM TC 99M SESTAMIBI GENERIC - CARDIOLITE
30.0000 | Freq: Once | INTRAVENOUS | Status: AC | PRN
  Administered 2014-07-03: 30 via INTRAVENOUS

## 2014-07-03 NOTE — Progress Notes (Signed)
Stress Lab Nurses Notes - [facility]  Audree CamelRebecca P Casasola 07/03/2014 Reason for doing test: CAD and Chest Pain Type of test: Marlane HatcherLexiscan Cardiolite Nurse performing test: Parke PoissonPhyllis Billingsly, RN Nuclear Medicine Tech: Marcella DubsMiranda Womack Echo Tech: Not Applicable MD performing test: Branch/M.Lenze PA Family MD: Vyas Test explained and consent signed: Yes.   IV started: Saline lock flushed, No redness or edema and Saline lock started in radiology Symptoms: chest pressure Treatment/Intervention: None Reason test stopped: protocol completed After recovery IV was: Discontinued via X-ray tech and No redness or edema Patient to return to Nuc. Med at : 11:30 Patient discharged: Home Patient's Condition upon discharge was: stable Comments: During test BP 122/63 & HR 85.  Recovery BP 115/74 & HR 72.  Symptoms resolved in recovery. Erskine SpeedBillingsley, Majestic Brister T

## 2014-07-04 ENCOUNTER — Encounter (HOSPITAL_COMMUNITY): Payer: Medicare Other

## 2014-07-04 ENCOUNTER — Ambulatory Visit (HOSPITAL_COMMUNITY): Payer: Medicare Other

## 2014-07-05 ENCOUNTER — Telehealth: Payer: Self-pay

## 2014-07-05 MED ORDER — LISINOPRIL 10 MG PO TABS: 10.0000 mg | ORAL_TABLET | Freq: Every day | ORAL | Status: DC

## 2014-07-05 MED ORDER — AMLODIPINE BESYLATE 2.5 MG PO TABS: 2.5000 mg | ORAL_TABLET | Freq: Every day | ORAL | Status: DC

## 2014-07-05 NOTE — Telephone Encounter (Signed)
Pt notified,rx's escribed to pharmacy

## 2014-07-05 NOTE — Telephone Encounter (Signed)
Message copied by Bernita Raisin on Fri Jul 05, 2014 12:02 PM ------      Message from: [place], [place]      Created: Fri Jul 05, 2014 11:22 AM       Stress test is normal.  No evid for ischemia or scar.      Would try to modify meds to see if helps with CP      Would cut lisinopril in 1/2 to 10 mg      Add 2.5 mg amlodipine to regimen.      F/U in clinic in 1 month to reeval. ------

## 2014-07-25 ENCOUNTER — Encounter (HOSPITAL_COMMUNITY): Payer: Self-pay | Admitting: Pharmacy Technician

## 2014-07-25 ENCOUNTER — Other Ambulatory Visit: Payer: Self-pay | Admitting: Neurosurgery

## 2014-08-14 ENCOUNTER — Encounter (HOSPITAL_COMMUNITY): Payer: Self-pay

## 2014-08-14 ENCOUNTER — Encounter (HOSPITAL_COMMUNITY)
Admission: RE | Admit: 2014-08-14 | Discharge: 2014-08-14 | Disposition: A | Discharge status: Home / Self Care | Payer: Medicare Other | Source: Ambulatory Visit | Attending: Neurosurgery | Admitting: Neurosurgery

## 2014-08-14 DIAGNOSIS — Z01812 Encounter for preprocedural laboratory examination: Secondary | ICD-10-CM | POA: Insufficient documentation

## 2014-08-14 DIAGNOSIS — M48061 Spinal stenosis, lumbar region without neurogenic claudication: Secondary | ICD-10-CM | POA: Insufficient documentation

## 2014-08-14 HISTORY — DX: Depression, unspecified: F32.A

## 2014-08-14 HISTORY — DX: Acute pancreatitis without necrosis or infection, unspecified: K85.90

## 2014-08-14 HISTORY — DX: Headache: R51

## 2014-08-14 HISTORY — DX: Major depressive disorder, single episode, unspecified: F32.9

## 2014-08-14 HISTORY — DX: Hypothyroidism, unspecified: E03.9

## 2014-08-14 HISTORY — DX: Rheumatic fever without heart involvement: I00

## 2014-08-14 HISTORY — DX: Anemia, unspecified: D64.9

## 2014-08-14 HISTORY — DX: Pneumonia, unspecified organism: J18.9

## 2014-08-14 HISTORY — DX: Other complications of anesthesia, initial encounter: T88.59XA

## 2014-08-14 HISTORY — DX: Unspecified convulsions: R56.9

## 2014-08-14 HISTORY — DX: Polyneuropathy, unspecified: G62.9

## 2014-08-14 HISTORY — DX: Unspecified abdominal hernia without obstruction or gangrene: K46.9

## 2014-08-14 HISTORY — DX: Irritable bowel syndrome, unspecified: K58.9

## 2014-08-14 HISTORY — DX: Cardiac murmur, unspecified: R01.1

## 2014-08-14 HISTORY — DX: Fatty (change of) liver, not elsewhere classified: K76.0

## 2014-08-14 HISTORY — DX: Adverse effect of unspecified anesthetic, initial encounter: T41.45XA

## 2014-08-14 HISTORY — DX: Cerebral infarction, unspecified: I63.9

## 2014-08-14 LAB — CBC WITH DIFFERENTIAL/PLATELET
Basophils Absolute: 0 10*3/uL (ref 0.0–0.1)
Basophils Relative: 0 % (ref 0–1)
Eosinophils Absolute: 0.2 10*3/uL (ref 0.0–0.7)
Eosinophils Relative: 2 % (ref 0–5)
HCT: 36.9 % (ref 36.0–46.0)
Hemoglobin: 13.4 g/dL (ref 12.0–15.0)
LYMPHS ABS: 2.8 10*3/uL (ref 0.7–4.0)
LYMPHS PCT: 42 % (ref 12–46)
MCH: 32.9 pg (ref 26.0–34.0)
MCHC: 36.3 g/dL — ABNORMAL HIGH (ref 30.0–36.0)
MCV: 90.7 fL (ref 78.0–100.0)
MONO ABS: 0.5 10*3/uL (ref 0.1–1.0)
Monocytes Relative: 7 % (ref 3–12)
Neutro Abs: 3.3 10*3/uL (ref 1.7–7.7)
Neutrophils Relative %: 49 % (ref 43–77)
Platelets: 143 10*3/uL — ABNORMAL LOW (ref 150–400)
RBC: 4.07 MIL/uL (ref 3.87–5.11)
RDW: 14.1 % (ref 11.5–15.5)
WBC: 6.8 10*3/uL (ref 4.0–10.5)

## 2014-08-14 LAB — COMPREHENSIVE METABOLIC PANEL
ALT: 21 U/L (ref 0–35)
AST: 33 U/L (ref 0–37)
Albumin: 3.5 g/dL (ref 3.5–5.2)
Alkaline Phosphatase: 113 U/L (ref 39–117)
Anion gap: 10 (ref 5–15)
BILIRUBIN TOTAL: 1.2 mg/dL (ref 0.3–1.2)
BUN: 9 mg/dL (ref 6–23)
CALCIUM: 9 mg/dL (ref 8.4–10.5)
CHLORIDE: 100 meq/L (ref 96–112)
CO2: 27 mEq/L (ref 19–32)
CREATININE: 0.66 mg/dL (ref 0.50–1.10)
GFR calc Af Amer: >90 mL/min (ref >90)
GFR calc non Af Amer: >90 mL/min (ref >90)
Glucose, Bld: 205 mg/dL — ABNORMAL HIGH (ref 70–99)
Potassium: 3.9 mEq/L (ref 3.7–5.3)
Sodium: 137 mEq/L (ref 137–147)
Total Protein: 7.1 g/dL (ref 6.0–8.3)

## 2014-08-14 LAB — SURGICAL PCR SCREEN
MRSA, PCR: NEGATIVE
STAPHYLOCOCCUS AUREUS: NEGATIVE

## 2014-08-14 NOTE — Pre-Procedure Instructions (Signed)
Kimberly [name]  08/14/2014   Your procedure is scheduled on: Friday, August 23, 2014  Report to [facility] Admitting at 5:45 AM.  Call this number if you have problems the morning of surgery: [phone]   Remember:   Do not eat food or drink liquids after midnight Thursday, August 22, 2014   Take these medicines the morning of surgery with A SIP OF WATER:amLODipine (NORVASC), carvedilol (COREG), doxycycline (ADOXA), famotidine (PEPCID), Levothyroxine, pantoprazole (PROTONIX), THERA TEARS ALLERGY eye drops, Fluticasone-Salmeterol (ADVAIR ),  albuterol (VENTOLIN ) inhaler ( Bring inhaler in with you on day of procedure), if needed: HYDROcodone ( Norco) for pain Stop taking Aspirin, vitamins, and herbal medications. Do not take any NSAIDs ie: Ibuprofen, Advil, Naproxen or any medication containing Aspirin;stop 7 days prior to procedure Friday,Sept. 25, 2015.  Do not wear jewelry, make-up or nail polish.  Do not wear lotions, powders, or perfumes. You may not wear deodorant.  Do not shave 48 hours prior to surgery.   Do not bring valuables to the hospital.  [facility] is not responsible for any belongings or valuables.               Contacts, dentures or bridgework may not be worn into surgery.  Leave suitcase in the car. After surgery it may be brought to your room.  For patients admitted to the hospital, discharge time is determined by your treatment team.               Patients discharged the day of surgery will not be allowed to drive home.  Name and phone number of your driver:   Special Instructions: Special Instructions:Special Instructions: [facility] - Preparing for Surgery  Before surgery, you can play an important role.  Because skin is not sterile, your skin needs to be as free of germs as possible.  You can reduce the number of germs on you skin by washing with CHG (chlorahexidine gluconate) soap before surgery.  CHG is an antiseptic cleaner which kills germs and  bonds with the skin to continue killing germs even after washing.  Please DO NOT use if you have an allergy to CHG or antibacterial soaps.  If your skin becomes reddened/irritated stop using the CHG and inform your nurse when you arrive at Short Stay.  Do not shave (including legs and underarms) for at least 48 hours prior to the first CHG shower.  You may shave your face.  Please follow these instructions carefully:   1.  Shower with CHG Soap the night before surgery and the morning of Surgery.  2.  If you choose to wash your hair, wash your hair first as usual with your normal shampoo.  3.  After you shampoo, rinse your hair and body thoroughly to remove the Shampoo.  4.  Use CHG as you would any other liquid soap.  You can apply chg directly  to the skin and wash gently with scrungie or a clean washcloth.  5.  Apply the CHG Soap to your body ONLY FROM THE NECK DOWN.  Do not use on open wounds or open sores.  Avoid contact with your eyes, ears, mouth and genitals (private parts).  Wash genitals (private parts) with your normal soap.  6.  Wash thoroughly, paying special attention to the area where your surgery will be performed.  7.  Thoroughly rinse your body with warm water from the neck down.  8.  DO NOT shower/wash with your normal soap after  using and rinsing off the CHG Soap.  9.  Pat yourself dry with a clean towel.            10.  Wear clean pajamas.            11.  Place clean sheets on your bed the night of your first shower and do not sleep with pets.  Day of Surgery  Do not apply any lotions/deodorants the morning of surgery.  Please wear clean clothes to the hospital/surgery center.   Please read over the following fact sheets that you were given: Pain Booklet, Coughing and Deep Breathing, MRSA Information and Surgical Site Infection Prevention

## 2014-08-16 NOTE — Progress Notes (Signed)
Anesthesia chart review:  Patient is a 58 year old female scheduled for left L2-3 laminectomy on 08/23/14 by Dr. Jordan Likes.  History includes non-smoker, non-obstructive CAD '08, diastolic CHF, Rheumatic fever,childhood murmur, fatty liver, pancreatitis, OSA, SOB, DM2, lacunar CVA/toxic metabolic encephalopathy due to drug overdose 11/2013 (possible acute/subacauate cerebral ischemia by head CT but no acute infarct on brain MRI 12/10/13; [facility]), GERD, HTN, asthma, arthritis, spinal stenosis, depression, hypothyroidism, IBS, childhood anemia, seizure as a child, cholecystectomy, appendectomy, hysterectomy, back surgery.  BMI is consistent with morbid obesity.  PCP is Cardiologist is Dr. Dietrich Pates, last visit 06/28/14 for follow-up chest pain with reassuring stress and echo (see below). PCP is listed as Dr. Doreen Beam.  For anesthesia history, she reported having an asthma attack when awaking from anesthesia.  EKG on 06/21/14 showed NSR.  Nuclear stress test on 07/03/14 showed:  1. Negative Lexiscan for ischemia  2. Normal LV systolic function, LVEF 58%. Normal wall motion.  3. Low risk study for major cardiiac events.  Echo on 06/22/14 showed:  - Left ventricle: The cavity size was normal. Wall thickness was increased in a pattern of mild LVH. Systolic function was normal. The estimated ejection fraction was in the range of 60% to 65%. Wall motion was normal; there were no regional wall motion abnormalities. - Left atrium: The atrium was moderately dilated. - Right ventricle: The cavity size was mildly dilated. Wall thickness was normal. - Right atrium: The atrium was mildly dilated.  Cardiac cath on 04/28/07 showed:  1. Left main coronary artery is medium in caliber and gives rise to left anterior descending, ramus intermedius and circumflex vessels. There is no significant flow-limiting coronary atherosclerosis noted.  2. The left anterior descending is a medium caliber vessel that tapers  towards the apex. This vessel gives rise to a fairly large proximal diagonal branch and a smaller distal diagonal branch. Flow was TIMI III throughout the vessel and is no significant flow- limiting coronary atherosclerosis noted.  3. There is a small to medium caliber ramus intermedius without significant flow-limiting coronary atherosclerosis.  4. The circumflex coronary artery is a medium to large caliber vessel with four obtuse marginal branches, the third of which is the largest and bifurcates. There are minor luminal irregularities without flow-limiting coronary atherosclerosis.  5. The right coronary artery is a medium caliber dominant vessel. There are minor luminal irregularities without significant flow- limiting coronary sclerosis.  6. Left ventriculography was performed in the RAO projection and revealed ejection fraction of approximately 55% without significant wall motion abnormality or significant mitral regurgitation.  DIAGNOSES:  1. No significant flow-limiting coronary atherosclerosis noted within major epicardial vessels. There is some distal tapering of the left anterior descending, although flow is TIMI III.  2. Left ventricular ejection fraction approximately 55% without significant wall motion abnormality. No significant mitral regurgitation. The left ventricle end-diastolic pressure of 16 mmHg.   Carotid duplex on 12/10/13 [facility]; scanned under Media tab) showed: No significant carotid bifurcation plaque or stenosis.  CXR on 06/21/14 showed: No active cardiopulmonary disease.  Preoperative labs noted.  Cr 0.66. Glucose 205. AST/ALT WNL. H/H 13.4/36.9.  PLT count 143K. She will get a fasting CBG on arrival.  Patient had recent unremarkable stress and echo.  Question of CVA earlier > 6 months ago versus drug overdose--MRI was negative. Further evaluation by her assigned anesthesiologist on the day of surgery, but if no acute changes then I would anticipate that she could proceed  as planned.  Velna Ochs [facility] Short Stay Center/Anesthesiology Phone [phone] 08/16/2014 5:23 PM

## 2014-08-22 MED ORDER — CEFAZOLIN SODIUM-DEXTROSE 2-3 GM-% IV SOLR
2.0000 g | INTRAVENOUS | Status: AC
  Administered 2014-08-23: 2 g via INTRAVENOUS
  Filled 2014-08-22: qty 50

## 2014-08-22 MED ORDER — DEXAMETHASONE SODIUM PHOSPHATE 10 MG/ML IJ SOLN: 10.0000 mg | INTRAMUSCULAR | Status: DC

## 2014-08-23 ENCOUNTER — Ambulatory Visit (HOSPITAL_COMMUNITY): Payer: Medicare Other | Admitting: Anesthesiology

## 2014-08-23 ENCOUNTER — Encounter (HOSPITAL_COMMUNITY): Payer: Self-pay | Admitting: *Deleted

## 2014-08-23 ENCOUNTER — Encounter (HOSPITAL_COMMUNITY)
Admission: RE | Disposition: A | Discharge status: Rehabilitation Facility | Payer: Self-pay | Source: Ambulatory Visit | Attending: Neurosurgery

## 2014-08-23 ENCOUNTER — Encounter (HOSPITAL_COMMUNITY): Payer: Medicare Other | Admitting: Vascular Surgery

## 2014-08-23 ENCOUNTER — Inpatient Hospital Stay (HOSPITAL_COMMUNITY)
Admission: RE | Admit: 2014-08-23 | Discharge: 2014-08-28 | DRG: 516 | Disposition: A | Discharge status: Rehabilitation Facility | Payer: Medicare Other | Source: Ambulatory Visit | Attending: Neurosurgery | Admitting: Neurosurgery

## 2014-08-23 ENCOUNTER — Ambulatory Visit (HOSPITAL_COMMUNITY): Payer: Medicare Other

## 2014-08-23 DIAGNOSIS — Z79899 Other long term (current) drug therapy: Secondary | ICD-10-CM

## 2014-08-23 DIAGNOSIS — Z794 Long term (current) use of insulin: Secondary | ICD-10-CM | POA: Diagnosis not present

## 2014-08-23 DIAGNOSIS — M4806 Spinal stenosis, lumbar region: Secondary | ICD-10-CM | POA: Diagnosis present

## 2014-08-23 DIAGNOSIS — K219 Gastro-esophageal reflux disease without esophagitis: Secondary | ICD-10-CM | POA: Diagnosis present

## 2014-08-23 DIAGNOSIS — Z8673 Personal history of transient ischemic attack (TIA), and cerebral infarction without residual deficits: Secondary | ICD-10-CM

## 2014-08-23 DIAGNOSIS — Z6841 Body Mass Index (BMI) 40.0 and over, adult: Secondary | ICD-10-CM | POA: Diagnosis not present

## 2014-08-23 DIAGNOSIS — I5032 Chronic diastolic (congestive) heart failure: Secondary | ICD-10-CM | POA: Diagnosis present

## 2014-08-23 DIAGNOSIS — G4733 Obstructive sleep apnea (adult) (pediatric): Secondary | ICD-10-CM | POA: Diagnosis present

## 2014-08-23 DIAGNOSIS — M47816 Spondylosis without myelopathy or radiculopathy, lumbar region: Secondary | ICD-10-CM | POA: Diagnosis present

## 2014-08-23 DIAGNOSIS — M47896 Other spondylosis, lumbar region: Secondary | ICD-10-CM | POA: Diagnosis present

## 2014-08-23 DIAGNOSIS — M47817 Spondylosis without myelopathy or radiculopathy, lumbosacral region: Secondary | ICD-10-CM

## 2014-08-23 DIAGNOSIS — M79661 Pain in right lower leg: Secondary | ICD-10-CM | POA: Diagnosis present

## 2014-08-23 DIAGNOSIS — I2510 Atherosclerotic heart disease of native coronary artery without angina pectoris: Secondary | ICD-10-CM | POA: Diagnosis present

## 2014-08-23 DIAGNOSIS — M4807 Spinal stenosis, lumbosacral region: Secondary | ICD-10-CM | POA: Diagnosis present

## 2014-08-23 DIAGNOSIS — G9519 Other vascular myelopathies: Secondary | ICD-10-CM | POA: Diagnosis present

## 2014-08-23 DIAGNOSIS — I10 Essential (primary) hypertension: Secondary | ICD-10-CM | POA: Diagnosis not present

## 2014-08-23 DIAGNOSIS — E039 Hypothyroidism, unspecified: Secondary | ICD-10-CM | POA: Diagnosis present

## 2014-08-23 DIAGNOSIS — G9611 Dural tear: Secondary | ICD-10-CM | POA: Diagnosis present

## 2014-08-23 DIAGNOSIS — Z7982 Long term (current) use of aspirin: Secondary | ICD-10-CM

## 2014-08-23 DIAGNOSIS — E1140 Type 2 diabetes mellitus with diabetic neuropathy, unspecified: Secondary | ICD-10-CM | POA: Diagnosis present

## 2014-08-23 HISTORY — PX: LUMBAR LAMINECTOMY/DECOMPRESSION MICRODISCECTOMY: SHX5026

## 2014-08-23 LAB — GLUCOSE, CAPILLARY
GLUCOSE-CAPILLARY: 143 mg/dL — AB (ref 70–99)
GLUCOSE-CAPILLARY: 241 mg/dL — AB (ref 70–99)
Glucose-Capillary: 155 mg/dL — ABNORMAL HIGH (ref 70–99)

## 2014-08-23 LAB — CBC
HEMATOCRIT: 28 % — AB (ref 36.0–46.0)
HEMOGLOBIN: 10.2 g/dL — AB (ref 12.0–15.0)
MCH: 33.1 pg (ref 26.0–34.0)
MCHC: 36.4 g/dL — ABNORMAL HIGH (ref 30.0–36.0)
MCV: 90.9 fL (ref 78.0–100.0)
Platelets: 107 10*3/uL — ABNORMAL LOW (ref 150–400)
RBC: 3.08 MIL/uL — AB (ref 3.87–5.11)
RDW: 14 % (ref 11.5–15.5)
WBC: 6.8 10*3/uL (ref 4.0–10.5)

## 2014-08-23 LAB — POCT I-STAT 4, (NA,K, GLUC, HGB,HCT)
GLUCOSE: 113 mg/dL — AB (ref 70–99)
Glucose, Bld: 117 mg/dL — ABNORMAL HIGH (ref 70–99)
HCT: 26 % — ABNORMAL LOW (ref 36.0–46.0)
HEMATOCRIT: 26 % — AB (ref 36.0–46.0)
Hemoglobin: 8.8 g/dL — ABNORMAL LOW (ref 12.0–15.0)
Hemoglobin: 8.8 g/dL — ABNORMAL LOW (ref 12.0–15.0)
POTASSIUM: 3.4 meq/L — AB (ref 3.7–5.3)
Potassium: 3.5 mEq/L — ABNORMAL LOW (ref 3.7–5.3)
SODIUM: 139 meq/L (ref 137–147)
Sodium: 139 mEq/L (ref 137–147)

## 2014-08-23 LAB — ABO/RH: ABO/RH(D): A POS

## 2014-08-23 LAB — PREPARE RBC (CROSSMATCH)

## 2014-08-23 SURGERY — LUMBAR LAMINECTOMY/DECOMPRESSION MICRODISCECTOMY 1 LEVEL
Anesthesia: General | Site: Back | Laterality: Right

## 2014-08-23 MED ORDER — ACETAMINOPHEN 325 MG PO TABS: 650.0000 mg | ORAL_TABLET | ORAL | Status: DC | PRN

## 2014-08-23 MED ORDER — FAMOTIDINE 20 MG PO TABS
40.0000 mg | ORAL_TABLET | Freq: Every day | ORAL | Status: DC
  Administered 2014-08-23 – 2014-08-28 (×6): 40 mg via ORAL
  Filled 2014-08-23 (×7): qty 2

## 2014-08-23 MED ORDER — ACETAMINOPHEN 10 MG/ML IV SOLN
INTRAVENOUS | Status: AC
  Administered 2014-08-23: 1000 mg via INTRAVENOUS
  Filled 2014-08-23: qty 100

## 2014-08-23 MED ORDER — ONDANSETRON HCL 4 MG/2ML IJ SOLN
4.0000 mg | INTRAMUSCULAR | Status: DC | PRN
  Administered 2014-08-23: 4 mg via INTRAVENOUS
  Filled 2014-08-23: qty 2

## 2014-08-23 MED ORDER — DOXYCYCLINE MONOHYDRATE 100 MG PO TABS
100.0000 mg | ORAL_TABLET | Freq: Two times a day (BID) | ORAL | Status: DC
  Administered 2014-08-23 – 2014-08-28 (×9): 100 mg via ORAL
  Filled 2014-08-23 (×12): qty 1

## 2014-08-23 MED ORDER — OXYCODONE HCL 5 MG PO TABS
5.0000 mg | ORAL_TABLET | Freq: Once | ORAL | Status: AC | PRN
  Administered 2014-08-23: 5 mg via ORAL

## 2014-08-23 MED ORDER — MIDAZOLAM HCL 5 MG/5ML IJ SOLN
INTRAMUSCULAR | Status: DC | PRN
  Administered 2014-08-23: 2 mg via INTRAVENOUS

## 2014-08-23 MED ORDER — BUPIVACAINE HCL (PF) 0.25 % IJ SOLN
INTRAMUSCULAR | Status: DC | PRN
  Administered 2014-08-23: 20 mL

## 2014-08-23 MED ORDER — HYDROMORPHONE HCL 1 MG/ML IJ SOLN
0.5000 mg | INTRAMUSCULAR | Status: DC | PRN
  Administered 2014-08-23 – 2014-08-25 (×8): 1 mg via INTRAVENOUS
  Filled 2014-08-23 (×6): qty 1

## 2014-08-23 MED ORDER — PHENOL 1.4 % MT LIQD: 1.0000 | OROMUCOSAL | Status: DC | PRN

## 2014-08-23 MED ORDER — 0.9 % SODIUM CHLORIDE (POUR BTL) OPTIME
TOPICAL | Status: DC | PRN
  Administered 2014-08-23: 1000 mL

## 2014-08-23 MED ORDER — ONDANSETRON HCL 4 MG/2ML IJ SOLN
INTRAMUSCULAR | Status: DC | PRN
  Administered 2014-08-23: 4 mg via INTRAVENOUS

## 2014-08-23 MED ORDER — INSULIN ASPART 100 UNIT/ML ~~LOC~~ SOLN
0.0000 [IU] | Freq: Three times a day (TID) | SUBCUTANEOUS | Status: DC
  Administered 2014-08-24 (×2): 4 [IU] via SUBCUTANEOUS
  Administered 2014-08-24 – 2014-08-25 (×2): 7 [IU] via SUBCUTANEOUS
  Administered 2014-08-25: 3 [IU] via SUBCUTANEOUS
  Administered 2014-08-25: 4 [IU] via SUBCUTANEOUS
  Administered 2014-08-26: 7 [IU] via SUBCUTANEOUS
  Administered 2014-08-27: 3 [IU] via SUBCUTANEOUS
  Administered 2014-08-27 – 2014-08-28 (×2): 4 [IU] via SUBCUTANEOUS
  Administered 2014-08-28: 3 [IU] via SUBCUTANEOUS

## 2014-08-23 MED ORDER — INSULIN GLARGINE 100 UNIT/ML ~~LOC~~ SOLN
40.0000 [IU] | Freq: Every day | SUBCUTANEOUS | Status: DC
  Administered 2014-08-23 – 2014-08-26 (×4): 40 [IU] via SUBCUTANEOUS
  Filled 2014-08-23 (×6): qty 0.4

## 2014-08-23 MED ORDER — LIDOCAINE HCL (CARDIAC) 20 MG/ML IV SOLN
INTRAVENOUS | Status: AC
  Filled 2014-08-23: qty 5

## 2014-08-23 MED ORDER — OXYCODONE HCL 5 MG/5ML PO SOLN: 5.0000 mg | Freq: Once | ORAL | Status: AC | PRN

## 2014-08-23 MED ORDER — LISINOPRIL 10 MG PO TABS
10.0000 mg | ORAL_TABLET | Freq: Every day | ORAL | Status: DC
  Administered 2014-08-24 – 2014-08-28 (×3): 10 mg via ORAL
  Filled 2014-08-23 (×4): qty 1

## 2014-08-23 MED ORDER — INSULIN GLARGINE 100 UNIT/ML ~~LOC~~ SOLN
25.0000 [IU] | Freq: Every day | SUBCUTANEOUS | Status: DC
Start: 2014-08-23 — End: 2014-08-23
  Filled 2014-08-23: qty 0.25

## 2014-08-23 MED ORDER — AMLODIPINE BESYLATE 2.5 MG PO TABS
2.5000 mg | ORAL_TABLET | Freq: Every day | ORAL | Status: DC
  Administered 2014-08-24 – 2014-08-28 (×4): 2.5 mg via ORAL
  Filled 2014-08-23 (×5): qty 1

## 2014-08-23 MED ORDER — HYDROMORPHONE HCL 1 MG/ML IJ SOLN
INTRAMUSCULAR | Status: AC
  Filled 2014-08-23: qty 1

## 2014-08-23 MED ORDER — THROMBIN 20000 UNITS EX SOLR
CUTANEOUS | Status: DC | PRN
  Administered 2014-08-23: 11:00:00 via TOPICAL

## 2014-08-23 MED ORDER — PHENYLEPHRINE 40 MCG/ML (10ML) SYRINGE FOR IV PUSH (FOR BLOOD PRESSURE SUPPORT)
PREFILLED_SYRINGE | INTRAVENOUS | Status: AC
  Filled 2014-08-23: qty 10

## 2014-08-23 MED ORDER — INSULIN GLARGINE 100 UNIT/ML ~~LOC~~ SOLN
25.0000 [IU] | Freq: Every day | SUBCUTANEOUS | Status: DC
  Administered 2014-08-24 – 2014-08-27 (×4): 25 [IU] via SUBCUTANEOUS
  Filled 2014-08-23 (×5): qty 0.25

## 2014-08-23 MED ORDER — SODIUM CHLORIDE 0.9 % IV SOLN: Freq: Once | INTRAVENOUS | Status: DC

## 2014-08-23 MED ORDER — ROCURONIUM BROMIDE 100 MG/10ML IV SOLN
INTRAVENOUS | Status: DC | PRN
  Administered 2014-08-23: 10 mg via INTRAVENOUS
  Administered 2014-08-23: 50 mg via INTRAVENOUS

## 2014-08-23 MED ORDER — HEMOSTATIC AGENTS (NO CHARGE) OPTIME
TOPICAL | Status: DC | PRN
  Administered 2014-08-23 (×2): 1 via TOPICAL

## 2014-08-23 MED ORDER — LEVOTHYROXINE SODIUM 50 MCG PO TABS
75.0000 ug | ORAL_TABLET | Freq: Every day | ORAL | Status: DC
Start: 2014-08-24 — End: 2014-08-28
  Administered 2014-08-24 – 2014-08-28 (×5): 75 ug via ORAL
  Filled 2014-08-23 (×10): qty 1

## 2014-08-23 MED ORDER — OXYCODONE-ACETAMINOPHEN 5-325 MG PO TABS
1.0000 | ORAL_TABLET | ORAL | Status: DC | PRN
  Administered 2014-08-23 – 2014-08-28 (×17): 2 via ORAL
  Filled 2014-08-23 (×18): qty 2

## 2014-08-23 MED ORDER — ROCURONIUM BROMIDE 50 MG/5ML IV SOLN
INTRAVENOUS | Status: AC
  Filled 2014-08-23: qty 1

## 2014-08-23 MED ORDER — ALBUTEROL SULFATE (2.5 MG/3ML) 0.083% IN NEBU
2.5000 mg | INHALATION_SOLUTION | Freq: Two times a day (BID) | RESPIRATORY_TRACT | Status: DC
  Administered 2014-08-23 – 2014-08-26 (×7): 2.5 mg via RESPIRATORY_TRACT
  Filled 2014-08-23 (×7): qty 3

## 2014-08-23 MED ORDER — CYCLOBENZAPRINE HCL 10 MG PO TABS
ORAL_TABLET | ORAL | Status: AC
  Filled 2014-08-23: qty 1

## 2014-08-23 MED ORDER — HYDROMORPHONE HCL 1 MG/ML IJ SOLN
0.2500 mg | INTRAMUSCULAR | Status: DC | PRN
Start: 2014-08-23 — End: 2014-08-23
  Administered 2014-08-23 (×4): 0.5 mg via INTRAVENOUS

## 2014-08-23 MED ORDER — CARVEDILOL 12.5 MG PO TABS
12.5000 mg | ORAL_TABLET | Freq: Two times a day (BID) | ORAL | Status: DC
  Administered 2014-08-23 – 2014-08-28 (×7): 12.5 mg via ORAL
  Filled 2014-08-23 (×10): qty 1

## 2014-08-23 MED ORDER — ALBUTEROL SULFATE HFA 108 (90 BASE) MCG/ACT IN AERS: 2.0000 | INHALATION_SPRAY | Freq: Two times a day (BID) | RESPIRATORY_TRACT | Status: DC

## 2014-08-23 MED ORDER — ESTROGENS, CONJUGATED 0.625 MG/GM VA CREA
1.0000 | TOPICAL_CREAM | Freq: Every day | VAGINAL | Status: DC
  Administered 2014-08-26 – 2014-08-27 (×2): 1 via VAGINAL
  Filled 2014-08-23: qty 42.5

## 2014-08-23 MED ORDER — ASPIRIN EC 81 MG PO TBEC
81.0000 mg | DELAYED_RELEASE_TABLET | Freq: Every day | ORAL | Status: DC
  Administered 2014-08-23 – 2014-08-28 (×6): 81 mg via ORAL
  Filled 2014-08-23 (×6): qty 1

## 2014-08-23 MED ORDER — CEFAZOLIN SODIUM 1-5 GM-% IV SOLN
1.0000 g | Freq: Three times a day (TID) | INTRAVENOUS | Status: AC
  Administered 2014-08-23 – 2014-08-24 (×2): 1 g via INTRAVENOUS
  Filled 2014-08-23 (×2): qty 50

## 2014-08-23 MED ORDER — EPHEDRINE SULFATE 50 MG/ML IJ SOLN
INTRAMUSCULAR | Status: AC
  Filled 2014-08-23: qty 1

## 2014-08-23 MED ORDER — ACETAMINOPHEN 650 MG RE SUPP: 650.0000 mg | RECTAL | Status: DC | PRN

## 2014-08-23 MED ORDER — CYCLOBENZAPRINE HCL 10 MG PO TABS
10.0000 mg | ORAL_TABLET | Freq: Three times a day (TID) | ORAL | Status: DC | PRN
  Administered 2014-08-23 – 2014-08-28 (×5): 10 mg via ORAL
  Filled 2014-08-23 (×4): qty 1

## 2014-08-23 MED ORDER — EPHEDRINE SULFATE 50 MG/ML IJ SOLN
INTRAMUSCULAR | Status: DC | PRN
  Administered 2014-08-23: 10 mg via INTRAVENOUS

## 2014-08-23 MED ORDER — LIRAGLUTIDE 18 MG/3ML ~~LOC~~ SOPN: 1.8000 mg | PEN_INJECTOR | Freq: Every day | SUBCUTANEOUS | Status: DC

## 2014-08-23 MED ORDER — SODIUM CHLORIDE 0.9 % IJ SOLN
INTRAMUSCULAR | Status: AC
  Filled 2014-08-23: qty 10

## 2014-08-23 MED ORDER — FLEET ENEMA 7-19 GM/118ML RE ENEM: 1.0000 | ENEMA | Freq: Once | RECTAL | Status: AC | PRN

## 2014-08-23 MED ORDER — BISACODYL 10 MG RE SUPP
10.0000 mg | Freq: Every day | RECTAL | Status: DC | PRN
  Administered 2014-08-28: 10 mg via RECTAL
  Filled 2014-08-23 (×2): qty 1

## 2014-08-23 MED ORDER — THROMBIN 5000 UNITS EX SOLR
OROMUCOSAL | Status: DC | PRN
  Administered 2014-08-23: 11:00:00 via TOPICAL

## 2014-08-23 MED ORDER — GABAPENTIN 100 MG PO CAPS
100.0000 mg | ORAL_CAPSULE | Freq: Every evening | ORAL | Status: DC
  Administered 2014-08-23 – 2014-08-28 (×6): 100 mg via ORAL
  Filled 2014-08-23 (×6): qty 1

## 2014-08-23 MED ORDER — ATORVASTATIN CALCIUM 10 MG PO TABS
10.0000 mg | ORAL_TABLET | Freq: Every day | ORAL | Status: DC
  Administered 2014-08-23 – 2014-08-28 (×6): 10 mg via ORAL
  Filled 2014-08-23 (×6): qty 1

## 2014-08-23 MED ORDER — THROMBIN 5000 UNITS EX SOLR
CUTANEOUS | Status: DC | PRN
  Administered 2014-08-23 (×4): 5000 [IU] via TOPICAL

## 2014-08-23 MED ORDER — FUROSEMIDE 20 MG PO TABS: 20.0000 mg | ORAL_TABLET | Freq: Every day | ORAL | Status: DC | PRN

## 2014-08-23 MED ORDER — GLYCOPYRROLATE 0.2 MG/ML IJ SOLN
INTRAMUSCULAR | Status: DC | PRN
  Administered 2014-08-23: 0.4 mg via INTRAVENOUS

## 2014-08-23 MED ORDER — ROPINIROLE HCL 1 MG PO TABS
1.0000 mg | ORAL_TABLET | Freq: Every day | ORAL | Status: DC
  Administered 2014-08-23 – 2014-08-27 (×5): 1 mg via ORAL
  Filled 2014-08-23 (×5): qty 1

## 2014-08-23 MED ORDER — KETOTIFEN FUMARATE 0.025 % OP SOLN
2.0000 [drp] | Freq: Two times a day (BID) | OPHTHALMIC | Status: DC
  Administered 2014-08-23 – 2014-08-28 (×10): 2 [drp] via OPHTHALMIC
  Filled 2014-08-23: qty 5

## 2014-08-23 MED ORDER — VANCOMYCIN HCL 1000 MG IV SOLR
INTRAVENOUS | Status: DC | PRN
  Administered 2014-08-23: 1000 mg

## 2014-08-23 MED ORDER — ONDANSETRON HCL 4 MG/2ML IJ SOLN: 4.0000 mg | Freq: Once | INTRAMUSCULAR | Status: DC | PRN

## 2014-08-23 MED ORDER — MOMETASONE FURO-FORMOTEROL FUM 200-5 MCG/ACT IN AERO
2.0000 | INHALATION_SPRAY | Freq: Two times a day (BID) | RESPIRATORY_TRACT | Status: DC
  Administered 2014-08-23 – 2014-08-28 (×8): 2 via RESPIRATORY_TRACT
  Filled 2014-08-23 (×2): qty 8.8

## 2014-08-23 MED ORDER — POLYETHYLENE GLYCOL 3350 17 G PO PACK
17.0000 g | PACK | Freq: Every day | ORAL | Status: DC | PRN
  Administered 2014-08-27: 17 g via ORAL
  Filled 2014-08-23: qty 1

## 2014-08-23 MED ORDER — NEOSTIGMINE METHYLSULFATE 10 MG/10ML IV SOLN
INTRAVENOUS | Status: DC | PRN
  Administered 2014-08-23: 3 mg via INTRAVENOUS

## 2014-08-23 MED ORDER — IPRATROPIUM-ALBUTEROL 0.5-2.5 (3) MG/3ML IN SOLN: 3.0000 mL | Freq: Every day | RESPIRATORY_TRACT | Status: DC | PRN

## 2014-08-23 MED ORDER — LACTATED RINGERS IV SOLN
INTRAVENOUS | Status: DC | PRN
  Administered 2014-08-23 (×3): via INTRAVENOUS

## 2014-08-23 MED ORDER — NEOSTIGMINE METHYLSULFATE 10 MG/10ML IV SOLN
INTRAVENOUS | Status: AC
  Filled 2014-08-23: qty 1

## 2014-08-23 MED ORDER — SENNA 8.6 MG PO TABS
1.0000 | ORAL_TABLET | Freq: Two times a day (BID) | ORAL | Status: DC
  Administered 2014-08-23 – 2014-08-28 (×10): 8.6 mg via ORAL
  Filled 2014-08-23 (×10): qty 1

## 2014-08-23 MED ORDER — SODIUM CHLORIDE 0.9 % IR SOLN
Status: DC | PRN
  Administered 2014-08-23: 08:00:00

## 2014-08-23 MED ORDER — HYDROCODONE-ACETAMINOPHEN 5-325 MG PO TABS: 1.0000 | ORAL_TABLET | ORAL | Status: DC | PRN

## 2014-08-23 MED ORDER — SODIUM CHLORIDE 0.9 % IV SOLN
250.0000 mL | INTRAVENOUS | Status: DC
Start: 2014-08-23 — End: 2014-08-28
  Administered 2014-08-23: 250 mL via INTRAVENOUS

## 2014-08-23 MED ORDER — MENTHOL 3 MG MT LOZG: 1.0000 | LOZENGE | OROMUCOSAL | Status: DC | PRN

## 2014-08-23 MED ORDER — OXYCODONE HCL 5 MG PO TABS
ORAL_TABLET | ORAL | Status: AC
  Filled 2014-08-23: qty 1

## 2014-08-23 MED ORDER — SODIUM CHLORIDE 0.9 % IJ SOLN
3.0000 mL | Freq: Two times a day (BID) | INTRAMUSCULAR | Status: DC
  Administered 2014-08-23 – 2014-08-28 (×5): 3 mL via INTRAVENOUS

## 2014-08-23 MED ORDER — LIDOCAINE HCL (CARDIAC) 20 MG/ML IV SOLN
INTRAVENOUS | Status: DC | PRN
Start: 2014-08-23 — End: 2014-08-23
  Administered 2014-08-23: 100 mg via INTRAVENOUS

## 2014-08-23 MED ORDER — INSULIN GLARGINE 100 UNIT/ML ~~LOC~~ SOLN
25.0000 [IU] | Freq: Two times a day (BID) | SUBCUTANEOUS | Status: DC
  Filled 2014-08-23: qty 0.4

## 2014-08-23 MED ORDER — FENTANYL CITRATE 0.05 MG/ML IJ SOLN
INTRAMUSCULAR | Status: DC | PRN
  Administered 2014-08-23: 50 ug via INTRAVENOUS
  Administered 2014-08-23: 100 ug via INTRAVENOUS
  Administered 2014-08-23 (×2): 50 ug via INTRAVENOUS

## 2014-08-23 MED ORDER — FENTANYL CITRATE 0.05 MG/ML IJ SOLN
INTRAMUSCULAR | Status: AC
  Filled 2014-08-23: qty 5

## 2014-08-23 MED ORDER — PROPOFOL 10 MG/ML IV BOLUS
INTRAVENOUS | Status: AC
  Filled 2014-08-23: qty 20

## 2014-08-23 MED ORDER — MEPERIDINE HCL 25 MG/ML IJ SOLN: 6.2500 mg | INTRAMUSCULAR | Status: DC | PRN

## 2014-08-23 MED ORDER — SODIUM CHLORIDE 0.9 % IJ SOLN
3.0000 mL | INTRAMUSCULAR | Status: DC | PRN
Start: 2014-08-23 — End: 2014-08-28

## 2014-08-23 MED ORDER — INSULIN ASPART 100 UNIT/ML ~~LOC~~ SOLN
15.0000 [IU] | Freq: Two times a day (BID) | SUBCUTANEOUS | Status: DC
  Administered 2014-08-25: 15 [IU] via SUBCUTANEOUS

## 2014-08-23 MED ORDER — VANCOMYCIN HCL 1000 MG IV SOLR
INTRAVENOUS | Status: AC
  Filled 2014-08-23: qty 1000

## 2014-08-23 MED ORDER — PHENYLEPHRINE HCL 10 MG/ML IJ SOLN
INTRAMUSCULAR | Status: DC | PRN
  Administered 2014-08-23 (×5): 80 ug via INTRAVENOUS

## 2014-08-23 MED ORDER — ONDANSETRON HCL 4 MG/2ML IJ SOLN
INTRAMUSCULAR | Status: AC
  Filled 2014-08-23: qty 2

## 2014-08-23 MED ORDER — ALUM & MAG HYDROXIDE-SIMETH 200-200-20 MG/5ML PO SUSP: 30.0000 mL | Freq: Four times a day (QID) | ORAL | Status: DC | PRN

## 2014-08-23 MED ORDER — LEVOTHYROXINE SODIUM 75 MCG PO CAPS: 1.0000 | ORAL_CAPSULE | Freq: Every day | ORAL | Status: DC

## 2014-08-23 MED ORDER — PROPOFOL 10 MG/ML IV BOLUS
INTRAVENOUS | Status: DC | PRN
  Administered 2014-08-23: 120 mg via INTRAVENOUS

## 2014-08-23 MED ORDER — MIDAZOLAM HCL 2 MG/2ML IJ SOLN
INTRAMUSCULAR | Status: AC
  Filled 2014-08-23: qty 2

## 2014-08-23 MED ORDER — GLYCOPYRROLATE 0.2 MG/ML IJ SOLN
INTRAMUSCULAR | Status: AC
  Filled 2014-08-23: qty 2

## 2014-08-23 SURGICAL SUPPLY — 66 items
BAG DECANTER FOR FLEXI CONT (MISCELLANEOUS) ×3 IMPLANT
BENZOIN TINCTURE PRP APPL 2/3 (GAUZE/BANDAGES/DRESSINGS) ×3 IMPLANT
BLADE CLIPPER SURG (BLADE) IMPLANT
BRUSH SCRUB EZ PLAIN DRY (MISCELLANEOUS) ×3 IMPLANT
BUR CUTTER 7.0 ROUND (BURR) ×3 IMPLANT
BUR MATCHSTICK NEURO 3.0 LAGG (BURR) ×3 IMPLANT
CANISTER SUCT 3000ML (MISCELLANEOUS) ×3 IMPLANT
CLOSURE WOUND 1/2 X4 (GAUZE/BANDAGES/DRESSINGS) ×1
CONT SPEC 4OZ CLIKSEAL STRL BL (MISCELLANEOUS) ×3 IMPLANT
DECANTER SPIKE VIAL GLASS SM (MISCELLANEOUS) ×3 IMPLANT
DERMABOND ADHESIVE PROPEN (GAUZE/BANDAGES/DRESSINGS) ×2
DERMABOND ADVANCED (GAUZE/BANDAGES/DRESSINGS) ×2
DERMABOND ADVANCED .7 DNX12 (GAUZE/BANDAGES/DRESSINGS) ×1 IMPLANT
DERMABOND ADVANCED .7 DNX6 (GAUZE/BANDAGES/DRESSINGS) ×1 IMPLANT
DRAPE LAPAROTOMY 100X72X124 (DRAPES) ×3 IMPLANT
DRAPE MICROSCOPE LEICA (MISCELLANEOUS) ×3 IMPLANT
DRAPE POUCH INSTRU U-SHP 10X18 (DRAPES) ×3 IMPLANT
DRAPE PROXIMA HALF (DRAPES) IMPLANT
DRAPE SURG 17X23 STRL (DRAPES) ×6 IMPLANT
DRSG OPSITE POSTOP 4X6 (GAUZE/BANDAGES/DRESSINGS) ×3 IMPLANT
DURAGUARD 04CMX04CM IMPLANT
DURAPREP 26ML APPLICATOR (WOUND CARE) ×3 IMPLANT
DURASEAL APPLICATOR TIP (TIP) ×3 IMPLANT
DURASEAL SPINE SEALANT 3ML (MISCELLANEOUS) ×6 IMPLANT
ELECT BLADE 4.0 EZ CLEAN MEGAD (MISCELLANEOUS) ×3
ELECT REM PT RETURN 9FT ADLT (ELECTROSURGICAL) ×3
ELECTRODE BLDE 4.0 EZ CLN MEGD (MISCELLANEOUS) ×1 IMPLANT
ELECTRODE REM PT RTRN 9FT ADLT (ELECTROSURGICAL) ×1 IMPLANT
GAUZE SPONGE 4X4 12PLY STRL (GAUZE/BANDAGES/DRESSINGS) ×3 IMPLANT
GAUZE SPONGE 4X4 16PLY XRAY LF (GAUZE/BANDAGES/DRESSINGS) IMPLANT
GLOVE BIOGEL PI IND STRL 7.5 (GLOVE) ×1 IMPLANT
GLOVE BIOGEL PI INDICATOR 7.5 (GLOVE) ×2
GLOVE ECLIPSE 9.0 STRL (GLOVE) IMPLANT
GLOVE EXAM NITRILE LRG STRL (GLOVE) IMPLANT
GLOVE EXAM NITRILE MD LF STRL (GLOVE) IMPLANT
GLOVE EXAM NITRILE XL STR (GLOVE) IMPLANT
GLOVE EXAM NITRILE XS STR PU (GLOVE) IMPLANT
GLOVE SS N UNI LF 7.5 STRL (GLOVE) ×9 IMPLANT
GLOVE SS N UNI LF STER SZ 9 (GLOVE) ×3 IMPLANT
GOWN STRL REUS W/ TWL LRG LVL3 (GOWN DISPOSABLE) IMPLANT
GOWN STRL REUS W/ TWL XL LVL3 (GOWN DISPOSABLE) ×3 IMPLANT
GOWN STRL REUS W/TWL 2XL LVL3 (GOWN DISPOSABLE) IMPLANT
GOWN STRL REUS W/TWL LRG LVL3 (GOWN DISPOSABLE)
GOWN STRL REUS W/TWL XL LVL3 (GOWN DISPOSABLE) ×6
GRAFT DURAGEN MATRIX 2WX2L ×3 IMPLANT
KIT BASIN OR (CUSTOM PROCEDURE TRAY) ×3 IMPLANT
KIT ROOM TURNOVER OR (KITS) ×3 IMPLANT
NEEDLE HYPO 22GX1.5 SAFETY (NEEDLE) ×3 IMPLANT
NEEDLE SPNL 22GX3.5 QUINCKE BK (NEEDLE) ×3 IMPLANT
NS IRRIG 1000ML POUR BTL (IV SOLUTION) ×3 IMPLANT
PACK LAMINECTOMY NEURO (CUSTOM PROCEDURE TRAY) ×3 IMPLANT
PAD ARMBOARD 7.5X6 YLW CONV (MISCELLANEOUS) ×9 IMPLANT
PATTIES SURGICAL 1X1 (DISPOSABLE) ×3 IMPLANT
RUBBERBAND STERILE (MISCELLANEOUS) ×6 IMPLANT
SPONGE SURGIFOAM ABS GEL SZ50 (HEMOSTASIS) ×3 IMPLANT
STRIP CLOSURE SKIN 1/2X4 (GAUZE/BANDAGES/DRESSINGS) ×2 IMPLANT
SUT ETHILON 3 0 FSL (SUTURE) ×3 IMPLANT
SUT PROLENE 5 0 C1 (SUTURE) ×6 IMPLANT
SUT PROLENE 6 0 BV (SUTURE) ×3 IMPLANT
SUT VIC AB 2-0 CT1 18 (SUTURE) ×6 IMPLANT
SUT VIC AB 3-0 SH 8-18 (SUTURE) ×3 IMPLANT
SYR 20ML ECCENTRIC (SYRINGE) ×3 IMPLANT
TAPE STRIPS DRAPE STRL (GAUZE/BANDAGES/DRESSINGS) ×3 IMPLANT
TOWEL OR 17X24 6PK STRL BLUE (TOWEL DISPOSABLE) ×3 IMPLANT
TOWEL OR 17X26 10 PK STRL BLUE (TOWEL DISPOSABLE) ×3 IMPLANT
WATER STERILE IRR 1000ML POUR (IV SOLUTION) ×3 IMPLANT

## 2014-08-23 NOTE — Op Note (Signed)
Date of procedure: 08/23/2014  Date of dictation: Same  Service: Neurosurgery  Preoperative diagnosis: Right L2-3 spondylosis with severe stenosis and radiculopathy.  Postoperative diagnosis: Same with dense adherence of the spondylitic protrusion and incorporation of bone violating the dura causing large unavoidable dural laceration  Procedure Name: L2-3 decompressive laminectomy with foraminotomies. Closure of complex dural laceration with microdissection.  Surgeon:Hiroshi Krummel A.Lianah Peed, M.D.  Asst. Surgeon: Ronnald Ramp  Anesthesia: General  Indication: 58 year old female with severe back and lower extremity pain failing conservative management her workup demonstrates evidence of severe spondylosis and stenosis at L2-3 causing marked spinal compression. Patient's failed conservative management. She presents now for decompressive surgery.  Operative note: After induction anesthesia, patient positioned prone on the Wilson frame and appropriate padded. Lumbar region prepped and draped. Incision made overlying L2-3. Dissection performed the right side. Retractor placed. X-ray taken. Level confirmed. Laminotomy performed using high-speed drill and Kerrison rongeurs to remove the inferior aspect of lamina of L2 medial aspect the L2-3 facet joint and the superior rim of the L3 lamina. Immediately upon removing a small section of lamina of L2 it became readily apparent that the dura matter was completely fused to the overlying bone. All efforts at developing any type of surgical plane between the bone and the dura matter were fruitless. In fact the dura itself did not seem to exist in this section. With that in mind I first attempted to circumferentially work around this problem but found dense adhesion and incompetence of the dura both superolaterally and inferiorly to this area. I then performed a subperiosteal section on the patient's left side. I performed a complete laminectomy of L2 using high-speed drill and  Kerrison rongeurs. I elevated the ligamentum flavum and slowly worked circumferentially around the dural defect. The cauda equina was readily visualized and protected throughout area and there is no evidence of obvious injury to the nerve roots themselves. Finally I had an [place] of bone which was still compressive that was fused into the dura. This was gently resected and the dural defect was appreciated in its full scope. Using microdissection I. mobilized the lateral wall of the dura and performed a primary closure of the dura using 5-0 Prolene suture. This appeared to give a watertight closure. I expect the spinal canal. There is no evidence of obvious continuing CSF leak. The L2 and L3 nerve root foramen were wide open. I placed DuraGen over the dural repair and then DuraSeal over the laminectomy defect. I placed him at additional DuraGen over the laminectomy defect. I then closed the wound in layers with Vicryl sutures and closed the skin with a running 3-0 nylon suture. There were no apparent complications aside from the unexpected dural defect. The patient tolerated the procedure well and she returns they're covering postop.

## 2014-08-23 NOTE — H&P (Signed)
Kimberly Kimberly [name] is Kimberly [name] 58 y.o. female.   Chief Complaint: Right leg pain HPI: 58 year old female with progressive, debilitating right lower extremity pain. Pain radiates and anterior thigh. Symptoms are aggravated by standing or walking. Workup demonstrates evidence of marked spondylosis with stenosis on the right side at L2-3. Patient's failed conservative management. She presents now for decompressive surgery in hopes of improving her symptoms.  Past Medical History  Diagnosis Date  . CAD (coronary artery disease)     nonobstructive by cath, 6/08 (false positive Cardiolite) normal stress echo, 7/11  . Heart failure, diastolic, chronic   . GERD (gastroesophageal reflux disease)   . Morbid obesity   . Obstructive sleep apnea   . IDDM (insulin dependent diabetes mellitus)   . HTN (hypertension)   . Shortness of breath   . Asthma   . Spinal stenosis   . Chronic back pain   . Arthritis   . Degenerative joint disease   . Glaucoma   . CHF (congestive heart failure)   . Complication of anesthesia     had Kimberly [name] asthma attack when woke up from procedure  . Stroke     12/09/2013  . Pneumonia     as a child  . Hypothyroidism   . Depression   . Hernia of abdominal cavity     "upper and lower hernia"  . Seizures     PMH: only as a child  . Heart murmur     PMH:As a child only  . Headache(784.0)   . Neuropathy     associated with diabetes  . Anemia     PMH: as a child  . Fatty liver   . Pancreatitis   . IBS (irritable bowel syndrome)   . Rheumatic fever     PMH: as a child    Past Surgical History  Procedure Laterality Date  . Eye surgery  1967  . Tubal ligation  1975  . Partial hysterectomy  1981  . Hemorrhoid surgery    . Hernia repair    . Spinahatomy  1992  . Cholecystectomy  1994  . Ovaries removed  2003  . Heel (other)  2005  . Abdominal hysterectomy    . Bilateral knee arthroscopy  2000, 2009  . Colonoscopy N/A 08/01/2013    Procedure: COLONOSCOPY;  Surgeon: Rogene Houston, MD;  Location: [facility];  Service: Endoscopy;  Laterality: N/A;  930  . Cataract extraction w/ intraocular lens  implant, bilateral    . Back surgery    . Appendectomy    . Cyst excision      Left breast  . Cardiac catheterization      2009    Family History  Problem Relation Age of Onset  . Cancer Other   . Heart failure Other   . Colon cancer Brother    Social History:  reports that she has never smoked. She has never used smokeless tobacco. She reports that she does not drink alcohol or use illicit drugs.  Allergies:  Allergies  Allergen Reactions  . Naproxen Anaphylaxis    REACTION: throat swelling  . Other     Local anesthia Had asthma attack  . Sulfonamide Derivatives     Yeast infection  . Latex Rash  . Tape Rash    Do not use adhesive tape; okay to use paper tape.    Medications Prior to Admission  Medication Sig Dispense Refill  . albuterol (VENTOLIN HFA) 108 (90 BASE) MCG/ACT inhaler Inhale  2 puffs into the lungs 2 (two) times daily.      Marland Kitchen amLODipine (NORVASC) 2.5 MG tablet Take 1 tablet (2.5 mg total) by mouth daily.  30 tablet  11  . aspirin EC 81 MG tablet Take 81 mg by mouth daily.      Marland Kitchen atorvastatin (LIPITOR) 10 MG tablet Take 10 mg by mouth daily.      . Calcium-Vitamin D-Vitamin K (VIACTIV) 643-329-51 MG-UNT-MCG CHEW Chew 1 capsule by mouth daily.      . carvedilol (COREG) 12.5 MG tablet Take 12.5 mg by mouth 2 (two) times daily with a meal.      . conjugated estrogens (PREMARIN) vaginal cream Place 1 Applicatorful vaginally at bedtime.      Marland Kitchen doxycycline (ADOXA) 100 MG tablet Take 100 mg by mouth 2 (two) times daily.      . famotidine (PEPCID) 40 MG tablet Take 40 mg by mouth daily.      . Fluticasone-Salmeterol (ADVAIR DISKUS) 500-50 MCG/DOSE AEPB Inhale 2 puffs into the lungs 2 (two) times daily.       . furosemide (LASIX) 20 MG tablet Take 20-40 mg by mouth daily as needed for fluid.      Marland Kitchen gabapentin (NEURONTIN) 100 MG capsule Take  100 mg by mouth every evening.      . insulin aspart (NOVOLOG FLEXPEN) 100 UNIT/ML injection Inject 15-25 Units into the skin 2 (two) times daily. 15 units at lunch, 25 units at supper. Then sliding scale as needed.      . insulin glargine (LANTUS SOLOSTAR) 100 UNIT/ML injection Inject 25-40 Units into the skin 2 (two) times daily. 40 @ bedtime, 25 in the morning      . ipratropium-albuterol (DUONEB) 0.5-2.5 (3) MG/3ML SOLN Take 3 mLs by nebulization daily as needed (for shortness of breath).       Marland Kitchen ketotifen (THERA TEARS ALLERGY) 0.025 % ophthalmic solution Place 2 drops into both eyes 2 (two) times daily. *Uses drops 2 to 4 times daily*      . Levothyroxine Sodium 75 MCG CAPS Take 1 capsule by mouth daily.       . Liraglutide (VICTOZA) 18 MG/3ML SOPN Inject 1.8 mg into the skin daily.      Marland Kitchen lisinopril (PRINIVIL,ZESTRIL) 10 MG tablet Take 1 tablet (10 mg total) by mouth daily.  30 tablet  11  . NON FORMULARY CPAP: use as directed        . OXYGEN-HELIUM IN Inhale 2.5 L into the lungs at bedtime. OXYGEN 2L: use as directed with cpap      . pantoprazole (PROTONIX) 40 MG tablet Take 1 tablet (40 mg total) by mouth 2 (two) times daily.  60 tablet  0  . rOPINIRole (REQUIP) 1 MG tablet Take 1 mg by mouth at bedtime.      Marland Kitchen HYDROcodone-acetaminophen (NORCO) 7.5-325 MG per tablet Take 1 tablet by mouth daily as needed for moderate pain.        Results for orders placed during the hospital encounter of 08/23/14 (from the past 48 hour(s))  GLUCOSE, CAPILLARY     Status: Abnormal   Collection Time    08/23/14  6:01 AM      Result Value Ref Range   Glucose-Capillary 143 (*) 70 - 99 mg/dL   No results found.  Review of Systems  Constitutional: Negative.   HENT: Negative.   Eyes: Negative.   Respiratory: Negative.   Cardiovascular: Negative.   Gastrointestinal: Negative.   Genitourinary: Negative.  Musculoskeletal: Negative.   Skin: Negative.   Neurological: Negative.   Endo/Heme/Allergies:  Negative.   Psychiatric/Behavioral: Negative.     Blood pressure 134/63, pulse 67, temperature 97.3 F (36.3 C), temperature source Oral, resp. rate 18, height 5' (1.524 m), weight 110.678 kg (244 lb), SpO2 95.00%. Physical Exam  Constitutional: She is oriented to person, place, and time. She appears well-developed and well-nourished. No distress.  HENT:  Head: Normocephalic and atraumatic.  Right Ear: External ear normal.  Left Ear: External ear normal.  Nose: Nose normal.  Mouth/Throat: Oropharynx is clear and moist.  Eyes: Conjunctivae and EOM are normal. Pupils are equal, round, and reactive to light. Right eye exhibits no discharge. Left eye exhibits no discharge.  Neck: Normal range of motion. Neck supple. No tracheal deviation present. No thyromegaly present.  Cardiovascular: Normal rate, regular rhythm, normal heart sounds and intact distal pulses.   No murmur heard. Respiratory: Effort normal and breath sounds normal. No respiratory distress. She has no wheezes.  GI: Soft. Bowel sounds are normal. She exhibits no distension. There is no tenderness.  Musculoskeletal: Normal range of motion. She exhibits no edema and no tenderness.  Neurological: She is alert and oriented to person, place, and time. She has normal reflexes. She displays normal reflexes. No cranial nerve deficit. She exhibits normal muscle tone. Coordination normal.  Skin: Skin is warm and dry. She is not diaphoretic.  Psychiatric: She has a normal mood and affect. Her behavior is normal. Judgment and thought content normal.     Assessment/Plan Right L2-3 spondylosis with stenosis and radiculopathy. Plan right L2-3 decompressive laminotomy and foraminotomies. Risks and benefits been explained. Patient wishes to proceed.  Kimberly Kimberly [name] A 08/23/2014, 7:46 AM

## 2014-08-23 NOTE — Transfer of Care (Signed)
Immediate Anesthesia Transfer of Care Note  Patient: Kimberly [name]  Procedure(s) Performed: Procedure(s) with comments: LUMBAR LAMINECTOMY/DECOMPRESSION MICRODISCECTOMY 1 LEVEL (Right) - LUMBAR LAMINECTOMY/DECOMPRESSION MICRODISCECTOMY 1 LEVEL LUMBAR 2-3  Patient Location: PACU  Anesthesia Type:General  Level of Consciousness: awake, alert , oriented and patient cooperative  Airway & Oxygen Therapy: Patient Spontanous Breathing and Patient connected to face mask oxygen  Post-op Assessment: Report given to PACU RN, Post -op Vital signs reviewed and stable and Patient moving all extremities X 4  Post vital signs: Reviewed and stable  Complications: No apparent anesthesia complications

## 2014-08-23 NOTE — Anesthesia Preprocedure Evaluation (Signed)
Anesthesia Evaluation  Patient identified by MRN, date of birth, ID band Patient awake    Reviewed: Allergy & Precautions, H&P , NPO status , Patient's Chart, lab work & pertinent test results  Airway Mallampati: I TM Distance: >3 FB Neck ROM: Full    Dental   Pulmonary sleep apnea ,          Cardiovascular hypertension, Pt. on medications     Neuro/Psych    GI/Hepatic   Endo/Other  diabetes, Type 2, Oral Hypoglycemic Agents  Renal/GU      Musculoskeletal   Abdominal   Peds  Hematology   Anesthesia Other Findings   Reproductive/Obstetrics                           Anesthesia Physical Anesthesia Plan  ASA: III  Anesthesia Plan: General   Post-op Pain Management:    Induction: Intravenous  Airway Management Planned: Oral ETT  Additional Equipment:   Intra-op Plan:   Post-operative Plan: Extubation in OR  Informed Consent: I have reviewed the patients History and Physical, chart, labs and discussed the procedure including the risks, benefits and alternatives for the proposed anesthesia with the patient or authorized representative who has indicated his/her understanding and acceptance.     Plan Discussed with: CRNA and Surgeon  Anesthesia Plan Comments:         Anesthesia Quick Evaluation

## 2014-08-23 NOTE — Anesthesia Postprocedure Evaluation (Signed)
Anesthesia Post Note  Patient: Kimberly CamelRebecca P Micciche  Procedure(s) Performed: Procedure(s) (LRB): LUMBAR LAMINECTOMY/DECOMPRESSION MICRODISCECTOMY 1 LEVEL (Right)  Anesthesia type: general  Patient location: PACU  Post pain: Pain level controlled  Post assessment: Patient's Cardiovascular Status Stable  Last Vitals:  Filed Vitals:   08/23/14 1445  BP:   Pulse: 63  Temp:   Resp: 10    Post vital signs: Reviewed and stable  Level of consciousness: sedated  Complications: No apparent anesthesia complications

## 2014-08-23 NOTE — Brief Op Note (Signed)
08/23/2014  11:35 AM  PATIENT:  Epifania Gore  58 y.o. female  PRE-OPERATIVE DIAGNOSIS:  Stenosis  POST-OPERATIVE DIAGNOSIS:  Stenosis  PROCEDURE:  Procedure(s) with comments: LUMBAR LAMINECTOMY/DECOMPRESSION MICRODISCECTOMY 1 LEVEL (Right) - LUMBAR LAMINECTOMY/DECOMPRESSION MICRODISCECTOMY 1 LEVEL LUMBAR 2-3  SURGEON:  Surgeon(s) and Role:    * Charlie Pitter, MD - Primary    * Eustace Moore, MD - Assisting  PHYSICIAN ASSISTANT:   ASSISTANTS:    ANESTHESIA:   general  EBL:  Total I/O In: 2900 [I.V.:2900] Out: 850 [Blood:850]  BLOOD ADMINISTERED:none  DRAINS: none   LOCAL MEDICATIONS USED:  MARCAINE     SPECIMEN:  No Specimen  DISPOSITION OF SPECIMEN:  N/A  COUNTS:  YES  TOURNIQUET:  * No tourniquets in log *  DICTATION: .Dragon Dictation  PLAN OF CARE: Admit to inpatient   PATIENT DISPOSITION:  PACU - hemodynamically stable.   Delay start of Pharmacological VTE agent (>24hrs) due to surgical blood loss or risk of bleeding: yes

## 2014-08-24 LAB — GLUCOSE, CAPILLARY
Glucose-Capillary: 182 mg/dL — ABNORMAL HIGH (ref 70–99)
Glucose-Capillary: 221 mg/dL — ABNORMAL HIGH (ref 70–99)
Glucose-Capillary: 185 mg/dL — ABNORMAL HIGH (ref 70–99)
Glucose-Capillary: 254 mg/dL — ABNORMAL HIGH (ref 70–99)

## 2014-08-24 NOTE — Progress Notes (Signed)
Entered patient room to place on CPAP patient stated she places herself on CPAP before sleep. Informed patient she could call RT if she needs help.

## 2014-08-24 NOTE — Progress Notes (Signed)
Postop day 1. Patient with back pain and some right lower extremity pain. Complains of numbness in her right lower extremity somewhat increased from last night. Says that her right lower extremity feels heavy. Left lower trimming stable. No headache.  Afebrile. Vitals are stable. Urine output good. Saturating well. Awake and alert. Oriented and appropriate. Motor and sensory function of upper trimming is intact. Left lower extremity motor and sensory function intact. Right lower extremity function limited by pain. Patient with antigravity strength in her quadriceps dorsiflexors and plantar flexors on the right. Difficult to evaluate more than that. Generalized decreased sensation from L2 distally in her right lower extremity.  Given the degree of compression and dural violation presented at surgery I'm not spry is that her right lower trimming he is a little worse today. I do not see signs or symptoms of an increasing compressive mass. I am hopeful that her symptoms will gradually improve with time as postoperative irritation lessons. Continue bedrest for now.

## 2014-08-25 LAB — GLUCOSE, CAPILLARY
GLUCOSE-CAPILLARY: 152 mg/dL — AB (ref 70–99)
GLUCOSE-CAPILLARY: 153 mg/dL — AB (ref 70–99)
GLUCOSE-CAPILLARY: 226 mg/dL — AB (ref 70–99)
GLUCOSE-CAPILLARY: 109 mg/dL — AB (ref 70–99)
Glucose-Capillary: 130 mg/dL — ABNORMAL HIGH (ref 70–99)

## 2014-08-25 MED ORDER — INFLUENZA VAC SPLIT QUAD 0.5 ML IM SUSY
0.5000 mL | PREFILLED_SYRINGE | INTRAMUSCULAR | Status: AC
  Administered 2014-08-26: 0.5 mL via INTRAMUSCULAR
  Filled 2014-08-25: qty 0.5

## 2014-08-25 MED ORDER — PNEUMOCOCCAL VAC POLYVALENT 25 MCG/0.5ML IJ INJ
0.5000 mL | INJECTION | INTRAMUSCULAR | Status: AC
  Administered 2014-08-26: 0.5 mL via INTRAMUSCULAR
  Filled 2014-08-25: qty 0.5

## 2014-08-25 NOTE — Progress Notes (Signed)
Patient ID: Kimberly [name], female   DOB: 1956/10/18, 58 y.o.   MRN: [id number] Patient continues to complain of back pain radiating into the right lower extremity. It radiates down the right anterior thigh as well as posterior thigh. She describes a heaviness in the right leg which is not worse than yesterday. No left leg symptoms.  Dressing is flat and dry. She seems to have fairly good strength in the left lower extremity and her right lower extremity exam is limited by pain. She has weakness in all muscle groups of the right lower extremity but again it's difficult to assess because of her pain. Most of her weakness appears to be more proximal than distal as best I can tell  Continue flat bedrest. Continue pain control.

## 2014-08-26 ENCOUNTER — Encounter (HOSPITAL_COMMUNITY): Payer: Self-pay | Admitting: Neurosurgery

## 2014-08-26 DIAGNOSIS — M47896 Other spondylosis, lumbar region: Secondary | ICD-10-CM | POA: Diagnosis not present

## 2014-08-26 LAB — GLUCOSE, CAPILLARY
GLUCOSE-CAPILLARY: 204 mg/dL — AB (ref 70–99)
Glucose-Capillary: 160 mg/dL — ABNORMAL HIGH (ref 70–99)
Glucose-Capillary: 120 mg/dL — ABNORMAL HIGH (ref 70–99)
Glucose-Capillary: 108 mg/dL — ABNORMAL HIGH (ref 70–99)
Glucose-Capillary: 231 mg/dL — ABNORMAL HIGH (ref 70–99)

## 2014-08-26 MED ORDER — INSULIN ASPART 100 UNIT/ML ~~LOC~~ SOLN
25.0000 [IU] | Freq: Every day | SUBCUTANEOUS | Status: DC
  Administered 2014-08-27: 25 [IU] via SUBCUTANEOUS

## 2014-08-26 MED ORDER — INSULIN ASPART 100 UNIT/ML ~~LOC~~ SOLN
15.0000 [IU] | Freq: Every day | SUBCUTANEOUS | Status: DC
  Administered 2014-08-27 – 2014-08-28 (×2): 15 [IU] via SUBCUTANEOUS

## 2014-08-26 NOTE — Progress Notes (Signed)
CARE MANAGEMENT NOTE 08/26/2014  Patient:  Kimberly [name],Kimberly [name]   Account Number:  [id number]  Date Initiated:  08/26/2014  Documentation initiated by:  Jiles CrockerHANDLER,Margurite Duffy  Subjective/Objective Assessment:   ADMITTED FOR SURGERY     Action/Plan:   CM FOLLOWING FOR DCP   Anticipated DC Date:  08/29/2014   Anticipated DC Plan:  PATIENT REMAINS ON BEDREST; CM WILL CONTINUE TO FOLLOW FOR DCP     DC Planning Services  CM consult           Status of service:  In process, will continue to follow Medicare Important Message given?   (If response is "NO", the following Medicare IM given date fields will be blank)  Per UR Regulation:  Reviewed for med. necessity/level of care/duration of stay  Comments:  10/5/2015Abelino Derrick- B Kamil Hanigan RN,BSN,MHA [phone]

## 2014-08-26 NOTE — Progress Notes (Signed)
Looks better today. Pain much better controlled. Moving right lower extremity much better. Still with numbness and tingling and right lower extremity.  Afebrile. Vital stable. Awake and alert. Motor examination 5/5 bilateral upper extremities and left lower extremity. 4/5 right lower extremity with much better effort today. Sensory exam still with decreased sensation to pinprick and light touch from L2 distally on right side. Good sacral sensation.   Overall doing better. Began efforts at mobilization and therapy.

## 2014-08-26 NOTE — Progress Notes (Signed)
Dressing to the incision site changed.

## 2014-08-27 DIAGNOSIS — I5032 Chronic diastolic (congestive) heart failure: Secondary | ICD-10-CM

## 2014-08-27 DIAGNOSIS — M47817 Spondylosis without myelopathy or radiculopathy, lumbosacral region: Secondary | ICD-10-CM

## 2014-08-27 LAB — TYPE AND SCREEN
ABO/RH(D): A POS
Antibody Screen: NEGATIVE
UNIT DIVISION: 0
UNIT DIVISION: 0

## 2014-08-27 LAB — GLUCOSE, CAPILLARY
GLUCOSE-CAPILLARY: 102 mg/dL — AB (ref 70–99)
GLUCOSE-CAPILLARY: 159 mg/dL — AB (ref 70–99)
GLUCOSE-CAPILLARY: 98 mg/dL (ref 70–99)
Glucose-Capillary: 121 mg/dL — ABNORMAL HIGH (ref 70–99)
Glucose-Capillary: 76 mg/dL (ref 70–99)

## 2014-08-27 MED ORDER — ALBUTEROL SULFATE (2.5 MG/3ML) 0.083% IN NEBU: 2.5000 mg | INHALATION_SOLUTION | Freq: Four times a day (QID) | RESPIRATORY_TRACT | Status: DC | PRN

## 2014-08-27 MED ORDER — ALBUTEROL SULFATE (2.5 MG/3ML) 0.083% IN NEBU
2.5000 mg | INHALATION_SOLUTION | Freq: Two times a day (BID) | RESPIRATORY_TRACT | Status: DC
  Administered 2014-08-27 – 2014-08-28 (×2): 2.5 mg via RESPIRATORY_TRACT
  Filled 2014-08-27 (×2): qty 3

## 2014-08-27 NOTE — Progress Notes (Signed)
Overall continues to get some better. Pain but certainly better controlled. Still complains of some numbness and right lower extremity pain. Her right lower extremity gave way when she attempted to stand and walk today. No headache. No wound drainage.  Awake and alert. Oriented and appropriate. Moving her right lower trimming more spontaneously with more normal strength. Still with generalized decreased sensation from L2 distally. Wound clean and dry. The left lower trimming function normal.  Progressing reasonably well. Continue efforts at mobilization. I will get rehabilitation medicine consult with regard to possible inpatient rehabilitation.

## 2014-08-27 NOTE — Evaluation (Signed)
Occupational Therapy Evaluation Patient Details Name: Kimberly CamelRebecca P [name] MRN: [id number] DOB: 27-Mar-1956 Today's Date: 08/27/2014    History of Present Illness 58 yo female s/p L2-3 decompression laminectomy.  PMH: CAD, GERD, HR, HTN, DM, Arthritis, CHF, Glaucoma, CVA 11/2013, Sz, IBS, BIL LE knee arthorscopy, back surg   Clinical Impression   Patient is s/p L2-3 surgery resulting in functional limitations due to the deficits listed below (see OT problem list). PTA mod i with RW and cane use Patient will benefit from skilled OT acutely to increase independence and safety with ADLS to allow discharge SNF. Ot to follow acutely for adl retraining with static standing balance. Pt demonstrates RLE buckle with static standing at this time. Pt is unsafe to d/c home.      Follow Up Recommendations  SNF;Supervision/Assistance - 24 hour    Equipment Recommendations  Other (comment) (defer)    Recommendations for Other Services       Precautions / Restrictions Precautions Precautions: Fall Precaution Comments: handout provided and reviewed in detail . Pt reading and recalling back to therapist using teach back      Mobility Bed Mobility Overal bed mobility: Needs Assistance Bed Mobility: Supine to Sit     Supine to sit: Min assist;HOB elevated     General bed mobility comments: cues for back precautions and safety  Transfers Overall transfer level: Needs assistance Equipment used: Rolling walker (2 wheeled) Transfers: Sit to/from [employer]GI CorporationStand;Stand Pivot Transfers Sit to Stand: Mod assist Stand pivot transfers: Max assist       General transfer comment: cues for safety hand placement and stand pivot    Balance Overall balance assessment: Needs assistance Sitting-balance support: Feet supported;Bilateral upper extremity supported Sitting balance-Leahy Scale: Good     Standing balance support: Bilateral upper extremity supported;During functional activity Standing balance-Leahy  Scale: Fair                              ADL Overall ADL's : Needs assistance/impaired Eating/Feeding: Independent;Bed level   Grooming: Oral care;Wash/dry face;Sitting;Modified independent Grooming Details (indicate cue type and reason): completed oral care in chair Upper Body Bathing: Minimal assitance;Sitting   Lower Body Bathing: Total assistance;Sit to/from stand           Toilet Transfer: Maximal assistance;Stand-pivot;RW;BSC Toilet Transfer Details (indicate cue type and reason): pt requires (A) To block R LE to prevent buckling         Functional mobility during ADLs:  (unable to complete due to RLE buckle) General ADL Comments: Pt reports nausea with sitting EOB. pt vomitting small amount no medication in vomit. Pt reports feeling better after vomit.  Pt positioned in chair with all items in reach     Vision                     Perception     Praxis      Pertinent Vitals/Pain Pain Assessment: 0-10 Pain Score: 9  Pain Location: back and numbness in RLE Pain Descriptors / Indicators: Numbness Pain Intervention(s): Repositioned;RN gave pain meds during session     Hand Dominance Left   Extremity/Trunk Assessment Upper Extremity Assessment Upper Extremity Assessment: Overall WFL for tasks assessed   Lower Extremity Assessment Lower Extremity Assessment: RLE deficits/detail RLE Deficits / Details: reports numbness, knee buckle with static standing, in gravity eliminated able to complete knee flexion / extension, decr hip flexion   Cervical / Trunk Assessment Cervical / Trunk  Assessment: Other exceptions (back surg)   Communication Communication Communication: No difficulties   Cognition Arousal/Alertness: Awake/alert Behavior During Therapy: WFL for tasks assessed/performed Overall Cognitive Status: Within Functional Limits for tasks assessed                     General Comments       Exercises       Shoulder  Instructions      Home Living Family/patient expects to be discharged to:: Private residence Living Arrangements: Spouse/significant other Available Help at Discharge: Family;Available PRN/intermittently Type of Home: House Home Access: Stairs to enter [employer] of Steps: 3 Entrance Stairs-Rails: None Home Layout: One level     Bathroom Shower/Tub: Tub/shower unit Shower/tub characteristics: Curtain Firefighter: Standard     Home Equipment: Environmental consultant - 2 wheels;Cane - single point;Shower seat;Hand held shower head          Prior Functioning/Environment Level of Independence: Independent with assistive device(s)        Comments: reports using RW in the house and cane at the grocery store    OT Diagnosis: Generalized weakness;Acute pain   OT Problem List: Decreased strength;Decreased activity tolerance;Impaired balance (sitting and/or standing);Decreased safety awareness;Decreased knowledge of use of DME or AE;Decreased knowledge of precautions;Pain;Obesity   OT Treatment/Interventions: Self-care/ADL training;Therapeutic exercise;Neuromuscular education;DME and/or AE instruction;Therapeutic activities;Patient/family education;Balance training    OT Goals(Current goals can be found in the care plan section) Acute Rehab OT Goals Patient Stated Goal: to return home OT Goal Formulation: With patient Time For Goal Achievement: 09/10/14 Potential to Achieve Goals: Good  OT Frequency: Min 2X/week   Barriers to D/C:            Co-evaluation              End of Session Equipment Utilized During Treatment: Gait belt;Rolling walker Nurse Communication: Mobility status;Precautions  Activity Tolerance: Patient tolerated treatment well Patient left: in chair;with call bell/phone within reach   Time: 0836-0920 OT Time Calculation (min): 44 min Charges:  OT General Charges $OT Visit: 1 Procedure OT Evaluation $Initial OT Evaluation Tier I: 1  Procedure OT Treatments $Self Care/Home Management : 38-52 mins G-Codes:    Harolyn Rutherford 09-18-14, 9:26 AM Pager: [phone]

## 2014-08-27 NOTE — Progress Notes (Signed)
Rehab admissions - I met with pt in follow up to rehab MD consult and explained the possibility of inpatient rehab. Pt had questions about the differences between rehab at SNF vs inpatient rehab. Pt is interested in pursuing inpatient rehab. She asked that I call and speak to her husband about rehab as well. "He says this is my ball game and I can go where I want to for rehab." Informational brochures were given.  I then called pt's husband, Kimberly [name] and gave information about our rehab program. He is in support of pt coming to inpatient rehab as well. Husband works 3rd shift.  I explained to both pt/husband that we will need insurance authorization from [facility] to admit to inpatient rehab. I will now open the case with [facility].  We will consider possible inpatient rehab stay pending her medical clearance, insurance authorization and our bed availability.  Please call me with any questions. Thanks.  Nanetta Batty, PT Rehabilitation Admissions Coordinator [phone]

## 2014-08-27 NOTE — Evaluation (Signed)
Physical Therapy Evaluation Patient Details Name: CATHRINE KRIZAN MRN: [id number] DOB: Jun 28, 1956 Today's Date: 08/27/2014   History of Present Illness  58 yo female s/p L2-3 decompression laminectom on 08/23/14, on bedrest ubtil 08/27/14.Marland Kitchen  PMH: CAD, GERD, HR, HTN, DM, Arthritis, CHF, Glaucoma, CVA 11/2013, Sz, IBS, BIL LE knee arthorscopy, back surg  Clinical Impression  Patient presents with very weak and dysfunctional R LE with difficulty walking. Patient will benefit from PT while in acute care to address problems listed in note. Patient will benefit from post acute rehab.     Follow Up Recommendations CIR;SNF;Supervision/Assistance - 24 hour (SNF if rehab not  option.)    Equipment Recommendations  None recommended by PT    Recommendations for Other Services Rehab consult     Precautions / Restrictions Precautions Precautions: Fall;Back Precaution Comments: R leg will buckle when weight  placed in standing.handout provided and reviewed in detail . Pt reading and recalling back to therapist using teach back Restrictions Weight Bearing Restrictions: No      Mobility  Bed Mobility Overal bed mobility: Needs Assistance Bed Mobility: Rolling;Sidelying to Sit Rolling: Min assist   Supine to sit: Min assist     General bed mobility comments: cues for back precautions and safety, USE OF RAIL TO SELF ASSIST INTO SITTING FROM l SIDE.   Transfers Overall transfer level: Needs assistance Equipment used: Rolling walker (2 wheeled) Transfers: Sit to/from Stand Sit to Stand: Mod assist;From elevated surface         General transfer comment: cues for safety hand placement to stand from Chair at [facility]. R knee stabilized externally to prevent buckling.  patient practiced tapping toes on L foot while standing to shift weight to R leg, R knee stabilized externally.STEDY was utilized to get from bed to recliner safely.  Ambulation/Gait                Stairs             Wheelchair Mobility    Modified Rankin (Stroke Patients Only)       Balance                                             Pertinent Vitals/Pain Pain Score: 9  Pain Location: back and R  leg Pain Descriptors / Indicators: Aching;Stabbing Pain Intervention(s): Monitored during session;Repositioned;Patient requesting pain meds-RN notified    Home Living Family/patient expects to be discharged to:: Private residence Living Arrangements: Spouse/significant other Available Help at Discharge: Family;Available PRN/intermittently (spouse works) Type of Home: House Home Access: Stairs to enter Entrance Stairs-Rails: None Secretary/administrator of Steps: 3 Home Layout: One level Home Equipment: Environmental consultant - 2 wheels;Cane - single point;Shower seat;Hand held shower head      Prior Function Level of Independence: Independent with assistive device(s)         Comments: reports using RW in the house and cane at the grocery store     Hand Dominance        Extremity/Trunk Assessment   Upper Extremity Assessment: Overall WFL for tasks assessed           Lower Extremity Assessment: RLE deficits/detail RLE Deficits / Details: dorsiflexion 2+/5, knee extension 3, hip flexion 2+    Cervical / Trunk Assessment: Other exceptions (BACK SURGERY)  Communication   Communication: No difficulties  Cognition Arousal/Alertness: Awake/alert Behavior During Therapy: [facility]  for tasks assessed/performed Overall Cognitive Status: Within Functional Limits for tasks assessed                      General Comments      Exercises General Exercises - Lower Extremity Ankle Circles/Pumps: AROM;Right;Seated Long Arc Quad: AROM;Right;10 reps;Seated      Assessment/Plan    PT Assessment Patient needs continued PT services  PT Diagnosis Difficulty walking;Acute pain;Other (comment) (R LE weakness)   PT Problem List Decreased strength;Decreased activity  tolerance;Decreased balance;Decreased mobility;Decreased safety awareness;Decreased knowledge of precautions;Decreased knowledge of use of DME;Pain;Impaired sensation  PT Treatment Interventions DME instruction;Gait training;Functional mobility training;Therapeutic activities;Therapeutic exercise;Patient/family education;Neuromuscular re-education   PT Goals (Current goals can be found in the Care Plan section) Acute Rehab PT Goals Patient Stated Goal: to return home, be able to walk. PT Goal Formulation: With patient Time For Goal Achievement: 09/10/14 Potential to Achieve Goals: Good    Frequency Min 4X/week   Barriers to discharge Decreased caregiver support      Co-evaluation               End of Session Equipment Utilized During Treatment: Gait belt Activity Tolerance: Patient limited by pain Patient left: in chair;with call bell/phone within reach;with chair alarm set Nurse Communication: Mobility status         Time: 1130-1157 PT Time Calculation (min): 27 min   Charges:   PT Evaluation $Initial PT Evaluation Tier I: 1 Procedure PT Treatments $Therapeutic Activity: 8-22 mins $Neuromuscular Re-education: 8-22 mins   PT G Codes:          Rada HayHill, Lakisa Lotz Elizabeth 08/27/2014, 12:15 PM Blanchard KelchKaren Abisai Coble PT [phone]

## 2014-08-27 NOTE — Progress Notes (Signed)
Inpatient Diabetes Program Recommendations  AACE/ADA: New Consensus Statement on Inpatient Glycemic Control (2013)  Target Ranges:  Prepandial:   less than 140 mg/dL      Peak postprandial:   less than 180 mg/dL (1-2 hours)      Critically ill patients:  140 - 180 mg/dL   Noted patient is sometimes refusing his meal coverage and/or correction coverage. Sometimes this is understandable given the cbg at the time. Other times he could use one or the other. Might suggest to lower the correction to moderate or even sensitive and lower meal coverage to 5 units with lunch (from 15 units) and 15 units at supper (from 25 units)  Lower doses ordered may be more accepting to patient than the higher doses for fear of hypoglycemia. A decreased dosage would be better than none at all when pt refuses to take.   Thank you, Lenor CoffinAnn Damyra Luscher, RN, CNS, Diabetes Coordinator [phone])

## 2014-08-27 NOTE — Progress Notes (Signed)
Placed patient on CPAP for the night via auto-mode with minimum pressure set at 5cm and maximum pressure set at 20cm. Oxygen set at 2lpm.  

## 2014-08-27 NOTE — Progress Notes (Signed)
PT Cancellation Note  Patient Details Name: Kimberly [name] MRN: [id number] DOB: 1956/11/13   Cancelled Treatment:    Reason Eval/Treat Not Completed: Other (comment) (patient was up by OT then has returned to bed. Will return and assist OOB for lunch.)   Rada HayHill, Shimika Ames Elizabeth 08/27/2014, 10:11 AM Blanchard KelchKaren Dakarri Kessinger PT [phone]

## 2014-08-27 NOTE — Consult Note (Signed)
Physical Medicine and Rehabilitation Consult  Reason for Consult: L2-3 stenosis with radiculopathy Referring Physician:  Dr. Annette Stable   HPI: Kimberly [name] is a 58 y.o. female with history of HTN, DM type 2 with neuropathy, CVA 1/15,  severe back and LE pain due to L2/3 spondylosis with severe stenosis and radiculopathy. Patient elected to undergo L2-3 decompressive lam with repair of complex dural laceration on 08/23/14 by Dr. Annette Stable. Post op on bedrest till 10/05 and patient with reports of RLE numbness and heaviness. OT evaluation done today and patient with RLE instability requiring blocking to prevent buckling with transfer. MD recommending CIR for progressive therapy.   Review of Systems  HENT: Negative for hearing loss.   Eyes: Negative for blurred vision and double vision.  Respiratory: Negative for cough, shortness of breath and wheezing.   Cardiovascular: Negative for chest pain and palpitations.  Gastrointestinal: Negative for heartburn and nausea.  Genitourinary: Positive for urgency and frequency.  Musculoskeletal: Positive for back pain and falls. Negative for myalgias.  Neurological: Positive for tingling, sensory change and focal weakness (RLE). Negative for headaches.       Reports problems with memory since stroke.     Past Medical History  Diagnosis Date  . CAD (coronary artery disease)     nonobstructive by cath, 6/08 (false positive Cardiolite) normal stress echo, 7/11  . Heart failure, diastolic, chronic   . GERD (gastroesophageal reflux disease)   . Morbid obesity   . Obstructive sleep apnea   . IDDM (insulin dependent diabetes mellitus)   . HTN (hypertension)   . Shortness of breath   . Asthma   . Spinal stenosis   . Chronic back pain   . Arthritis   . Degenerative joint disease   . Glaucoma   . CHF (congestive heart failure)   . Complication of anesthesia     had an asthma attack when woke up from procedure  . Stroke     12/09/2013  . Pneumonia      as a child  . Hypothyroidism   . Depression   . Hernia of abdominal cavity     "upper and lower hernia"  . Seizures     PMH: only as a child  . Heart murmur     PMH:As a child only  . Headache(784.0)   . Neuropathy     associated with diabetes  . Anemia     PMH: as a child  . Fatty liver   . Pancreatitis   . IBS (irritable bowel syndrome)   . Rheumatic fever     PMH: as a child   Past Surgical History  Procedure Laterality Date  . Eye surgery  1967  . Tubal ligation  1975  . Partial hysterectomy  1981  . Hemorrhoid surgery    . Hernia repair    . Spinahatomy  1992  . Cholecystectomy  1994  . Ovaries removed  2003  . Heel (other)  2005  . Abdominal hysterectomy    . Bilateral knee arthroscopy  2000, 2009  . Colonoscopy N/A 08/01/2013    Procedure: COLONOSCOPY;  Surgeon: Rogene Houston, MD;  Location: [facility];  Service: Endoscopy;  Laterality: N/A;  930  . Cataract extraction w/ intraocular lens  implant, bilateral    . Back surgery    . Appendectomy    . Cyst excision      Left breast  . Cardiac catheterization      2009  .  Lumbar laminectomy/decompression microdiscectomy Right 08/23/2014    Procedure: LUMBAR LAMINECTOMY/DECOMPRESSION MICRODISCECTOMY 1 LEVEL;  Surgeon: Charlie Pitter, MD;  Location: [facility];  Service: Neurosurgery;  Laterality: Right;  LUMBAR LAMINECTOMY/DECOMPRESSION MICRODISCECTOMY 1 LEVEL LUMBAR 2-3    Family History  Problem Relation Age of Onset  . Cancer Other   . Heart failure Other   . Colon cancer Brother     Social History:  Married. Used to work as a Scientist, water quality at [employer]. Disabled due to back issues since 2010. Sedentary PTA. Has been using a walker since stroke. She  reports that she has never smoked. She has never used smokeless tobacco. She reports that she does not drink alcohol or use illicit drugs.   Allergies  Allergen Reactions  . Naproxen Anaphylaxis    REACTION: throat swelling  . Other     Local  anesthia Had asthma attack  . Sulfonamide Derivatives     Yeast infection  . Latex Rash  . Tape Rash    Do not use adhesive tape; okay to use paper tape.    Medications Prior to Admission  Medication Sig Dispense Refill  . albuterol (VENTOLIN HFA) 108 (90 BASE) MCG/ACT inhaler Inhale 2 puffs into the lungs 2 (two) times daily.      Marland Kitchen amLODipine (NORVASC) 2.5 MG tablet Take 1 tablet (2.5 mg total) by mouth daily.  30 tablet  11  . aspirin EC 81 MG tablet Take 81 mg by mouth daily.      Marland Kitchen atorvastatin (LIPITOR) 10 MG tablet Take 10 mg by mouth daily.      . Calcium-Vitamin D-Vitamin K (VIACTIV) 976-734-19 MG-UNT-MCG CHEW Chew 1 capsule by mouth daily.      . carvedilol (COREG) 12.5 MG tablet Take 12.5 mg by mouth 2 (two) times daily with a meal.      . conjugated estrogens (PREMARIN) vaginal cream Place 1 Applicatorful vaginally at bedtime.      Marland Kitchen doxycycline (ADOXA) 100 MG tablet Take 100 mg by mouth 2 (two) times daily.      . famotidine (PEPCID) 40 MG tablet Take 40 mg by mouth daily.      . Fluticasone-Salmeterol (ADVAIR DISKUS) 500-50 MCG/DOSE AEPB Inhale 2 puffs into the lungs 2 (two) times daily.       . furosemide (LASIX) 20 MG tablet Take 20-40 mg by mouth daily as needed for fluid.      Marland Kitchen gabapentin (NEURONTIN) 100 MG capsule Take 100 mg by mouth every evening.      . insulin aspart (NOVOLOG FLEXPEN) 100 UNIT/ML injection Inject 15-25 Units into the skin 2 (two) times daily. 15 units at lunch, 25 units at supper. Then sliding scale as needed.      . insulin glargine (LANTUS SOLOSTAR) 100 UNIT/ML injection Inject 25-40 Units into the skin 2 (two) times daily. 40 @ bedtime, 25 in the morning      . ipratropium-albuterol (DUONEB) 0.5-2.5 (3) MG/3ML SOLN Take 3 mLs by nebulization daily as needed (for shortness of breath).       Marland Kitchen ketotifen (THERA TEARS ALLERGY) 0.025 % ophthalmic solution Place 2 drops into both eyes 2 (two) times daily. *Uses drops 2 to 4 times daily*      .  Levothyroxine Sodium 75 MCG CAPS Take 1 capsule by mouth daily.       . Liraglutide (VICTOZA) 18 MG/3ML SOPN Inject 1.8 mg into the skin daily.      Marland Kitchen lisinopril (PRINIVIL,ZESTRIL) 10 MG tablet Take  1 tablet (10 mg total) by mouth daily.  30 tablet  11  . NON FORMULARY CPAP: use as directed        . OXYGEN-HELIUM IN Inhale 2.5 L into the lungs at bedtime. OXYGEN 2L: use as directed with cpap      . pantoprazole (PROTONIX) 40 MG tablet Take 1 tablet (40 mg total) by mouth 2 (two) times daily.  60 tablet  0  . rOPINIRole (REQUIP) 1 MG tablet Take 1 mg by mouth at bedtime.      Marland Kitchen HYDROcodone-acetaminophen (NORCO) 7.5-325 MG per tablet Take 1 tablet by mouth daily as needed for moderate pain.        Home: Home Living Family/patient expects to be discharged to:: Private residence Living Arrangements: Spouse/significant other Available Help at Discharge: Family;Available PRN/intermittently (spouse works) Type of Home: House Home Access: Stairs to enter Technical brewer of Steps: 3 Entrance Stairs-Rails: None Home Layout: One level Home Equipment: Environmental consultant - 2 wheels;Cane - single point;Shower seat;Hand held shower head  Functional History: Prior Function Level of Independence: Independent with assistive device(s) Comments: reports using RW in the house and cane at the grocery store Functional Status:  Mobility: Bed Mobility Overal bed mobility: Needs Assistance Bed Mobility: Rolling;Sidelying to Sit Rolling: Min assist Supine to sit: Min assist General bed mobility comments: cues for back precautions and safety, USE OF RAIL TO SELF ASSIST INTO SITTING FROM l SIDE.  Transfers Overall transfer level: Needs assistance Equipment used: Rolling walker (2 wheeled) Transfers: Sit to/from Stand Sit to Stand: Mod assist;From elevated surface Stand pivot transfers: Max assist General transfer comment: cues for safety hand placement to stand from Chair at [place] knee stabilized externally to  prevent buckling.  patient practiced tapping toes on L foot while standing to shift weight to R leg, R knee stabilized externally.STEDY was utilized to get from bed to recliner safely.      ADL: ADL Overall ADL's : Needs assistance/impaired Eating/Feeding: Independent;Bed level Grooming: Oral care;Wash/dry face;Sitting;Modified independent Grooming Details (indicate cue type and reason): completed oral care in chair Upper Body Bathing: Minimal assitance;Sitting Lower Body Bathing: Total assistance;Sit to/from stand Toilet Transfer: Maximal assistance;Stand-pivot;RW;BSC Toilet Transfer Details (indicate cue type and reason): pt requires (A) To block R LE to prevent buckling Functional mobility during ADLs:  (unable to complete due to RLE buckle) General ADL Comments: Pt reports nausea with sitting EOB. pt vomitting small amount no medication in vomit. Pt reports feeling better after vomit.  Pt positioned in chair with all items in reach  Cognition: Cognition Overall Cognitive Status: Within Functional Limits for tasks assessed Orientation Level: Oriented X4 Cognition Arousal/Alertness: Awake/alert Behavior During Therapy: WFL for tasks assessed/performed Overall Cognitive Status: Within Functional Limits for tasks assessed  Blood pressure 125/78, pulse 60, temperature 98.2 F (36.8 C), temperature source Oral, resp. rate 20, height 5' (1.524 m), weight 110.678 kg (244 lb), SpO2 98.00%. Physical Exam  Nursing note and vitals reviewed. Constitutional: She is oriented to person, place, and time. She appears well-developed and well-nourished.  HENT:  Head: Normocephalic and atraumatic.  Eyes: Conjunctivae are normal. Pupils are equal, round, and reactive to light.  Neck: Normal range of motion. Neck supple.  Cardiovascular: Normal rate and regular rhythm.   Respiratory: Effort normal and breath sounds normal. No respiratory distress. She has no wheezes.  GI: Soft. Bowel sounds are  normal. She exhibits no distension. There is no tenderness.  Musculoskeletal:  Low back tenderness.   Neurological: She is alert  and oriented to person, place, and time.  Able to activate RLE minimally at hip. Decreased sensation to PP and LT RLE more than LLE. RLE: 1-2/5 HF, KE and 2/5 ADF/APF.   Skin: Skin is warm and dry.  Psychiatric: Her speech is normal. Thought content normal. Her affect is blunt.    Results for orders placed during the hospital encounter of 08/23/14 (from the past 24 hour(s))  GLUCOSE, CAPILLARY     Status: Abnormal   Collection Time    08/26/14  4:23 PM      Result Value Ref Range   Glucose-Capillary 108 (*) 70 - 99 mg/dL   Comment 1 Notify RN     Comment 2 Documented in Chart    GLUCOSE, CAPILLARY     Status: Abnormal   Collection Time    08/26/14  9:51 PM      Result Value Ref Range   Glucose-Capillary 231 (*) 70 - 99 mg/dL   Comment 1 Notify RN     Comment 2 Documented in Chart    GLUCOSE, CAPILLARY     Status: Abnormal   Collection Time    08/27/14  6:52 AM      Result Value Ref Range   Glucose-Capillary 102 (*) 70 - 99 mg/dL   Comment 1 Notify RN     Comment 2 Documented in Chart    GLUCOSE, CAPILLARY     Status: Abnormal   Collection Time    08/27/14 12:06 PM      Result Value Ref Range   Glucose-Capillary 159 (*) 70 - 99 mg/dL   Comment 1 Notify RN     Comment 2 Documented in Chart     No results found.  Assessment/Plan: Diagnosis: lumbar stenosis with neurogenic claudication 1. Does the need for close, 24 hr/day medical supervision in concert with the patient's rehab needs make it unreasonable for this patient to be served in a less intensive setting? Yes 2. Co-Morbidities requiring supervision/potential complications: chronic diastolic heart failure 3. Due to bladder management, bowel management, safety, skin/wound care, disease management, medication administration, pain management and patient education, does the patient require 24  hr/day rehab nursing? Yes 4. Does the patient require coordinated care of a physician, rehab nurse, PT (1-2 hrs/day, 5 days/week) and OT (1-2 hrs/day, 5 days/week) to address physical and functional deficits in the context of the above medical diagnosis(es)? Yes Addressing deficits in the following areas: balance, endurance, locomotion, strength, transferring, bowel/bladder control, bathing, dressing, feeding, grooming, toileting and psychosocial support 5. Can the patient actively participate in an intensive therapy program of at least 3 hrs of therapy per day at least 5 days per week? Yes 6. The potential for patient to make measurable gains while on inpatient rehab is excellent 7. Anticipated functional outcomes upon discharge from inpatient rehab are modified independent  with PT, modified independent to supervision with OT, n/a with SLP. 8. Estimated rehab length of stay to reach the above functional goals is: 10-14 days 9. Does the patient have adequate social supports to accommodate these discharge functional goals? Yes 10. Anticipated D/C setting: Home 11. Anticipated post D/C treatments: HH therapy and Outpatient therapy 12. Overall Rehab/Functional Prognosis: excellent  RECOMMENDATIONS: This patient's condition is appropriate for continued rehabilitative care in the following setting: CIR Patient has agreed to participate in recommended program. Yes Note that insurance prior authorization may be required for reimbursement for recommended care.  Comment: Pt with ongoing significant RLE weakness and sensory loss. Pain has been  an obstacle as well. Would benefit from our program. She would like to discuss with husband. Rehab Admissions Coordinator to follow up.  Thanks,  Meredith Staggers, MD, Mellody Drown     08/27/2014

## 2014-08-28 ENCOUNTER — Inpatient Hospital Stay (HOSPITAL_COMMUNITY)
Admission: RE | Admit: 2014-08-28 | Discharge: 2014-09-07 | DRG: 551 | Disposition: A | Discharge status: Home Health | Payer: Medicare Other | Source: Intra-hospital | Attending: Physical Medicine & Rehabilitation | Admitting: Physical Medicine & Rehabilitation

## 2014-08-28 DIAGNOSIS — Z8673 Personal history of transient ischemic attack (TIA), and cerebral infarction without residual deficits: Secondary | ICD-10-CM | POA: Diagnosis not present

## 2014-08-28 DIAGNOSIS — M792 Neuralgia and neuritis, unspecified: Secondary | ICD-10-CM

## 2014-08-28 DIAGNOSIS — M4806 Spinal stenosis, lumbar region: Secondary | ICD-10-CM | POA: Diagnosis present

## 2014-08-28 DIAGNOSIS — E039 Hypothyroidism, unspecified: Secondary | ICD-10-CM | POA: Diagnosis present

## 2014-08-28 DIAGNOSIS — M109 Gout, unspecified: Secondary | ICD-10-CM | POA: Diagnosis present

## 2014-08-28 DIAGNOSIS — G9519 Other vascular myelopathies: Secondary | ICD-10-CM | POA: Diagnosis present

## 2014-08-28 DIAGNOSIS — E663 Overweight: Secondary | ICD-10-CM

## 2014-08-28 DIAGNOSIS — R300 Dysuria: Secondary | ICD-10-CM | POA: Diagnosis present

## 2014-08-28 DIAGNOSIS — G8929 Other chronic pain: Secondary | ICD-10-CM | POA: Diagnosis present

## 2014-08-28 DIAGNOSIS — D62 Acute posthemorrhagic anemia: Secondary | ICD-10-CM | POA: Diagnosis present

## 2014-08-28 DIAGNOSIS — E1140 Type 2 diabetes mellitus with diabetic neuropathy, unspecified: Secondary | ICD-10-CM | POA: Diagnosis present

## 2014-08-28 DIAGNOSIS — I5032 Chronic diastolic (congestive) heart failure: Secondary | ICD-10-CM | POA: Diagnosis present

## 2014-08-28 DIAGNOSIS — Z79899 Other long term (current) drug therapy: Secondary | ICD-10-CM

## 2014-08-28 DIAGNOSIS — M4726 Other spondylosis with radiculopathy, lumbar region: Secondary | ICD-10-CM | POA: Diagnosis present

## 2014-08-28 DIAGNOSIS — M48062 Spinal stenosis, lumbar region with neurogenic claudication: Secondary | ICD-10-CM | POA: Diagnosis present

## 2014-08-28 DIAGNOSIS — E119 Type 2 diabetes mellitus without complications: Secondary | ICD-10-CM

## 2014-08-28 DIAGNOSIS — K219 Gastro-esophageal reflux disease without esophagitis: Secondary | ICD-10-CM | POA: Diagnosis present

## 2014-08-28 DIAGNOSIS — Z7982 Long term (current) use of aspirin: Secondary | ICD-10-CM | POA: Diagnosis not present

## 2014-08-28 DIAGNOSIS — F09 Unspecified mental disorder due to known physiological condition: Secondary | ICD-10-CM | POA: Diagnosis present

## 2014-08-28 DIAGNOSIS — M542 Cervicalgia: Secondary | ICD-10-CM | POA: Diagnosis not present

## 2014-08-28 DIAGNOSIS — G4733 Obstructive sleep apnea (adult) (pediatric): Secondary | ICD-10-CM | POA: Diagnosis present

## 2014-08-28 DIAGNOSIS — G8310 Monoplegia of lower limb affecting unspecified side: Secondary | ICD-10-CM

## 2014-08-28 DIAGNOSIS — Z794 Long term (current) use of insulin: Secondary | ICD-10-CM

## 2014-08-28 DIAGNOSIS — E876 Hypokalemia: Secondary | ICD-10-CM | POA: Diagnosis present

## 2014-08-28 DIAGNOSIS — I2510 Atherosclerotic heart disease of native coronary artery without angina pectoris: Secondary | ICD-10-CM | POA: Diagnosis present

## 2014-08-28 DIAGNOSIS — J45909 Unspecified asthma, uncomplicated: Secondary | ICD-10-CM | POA: Diagnosis present

## 2014-08-28 DIAGNOSIS — I10 Essential (primary) hypertension: Secondary | ICD-10-CM | POA: Diagnosis present

## 2014-08-28 DIAGNOSIS — M4807 Spinal stenosis, lumbosacral region: Secondary | ICD-10-CM

## 2014-08-28 DIAGNOSIS — I272 Other secondary pulmonary hypertension: Secondary | ICD-10-CM | POA: Diagnosis present

## 2014-08-28 DIAGNOSIS — M25571 Pain in right ankle and joints of right foot: Secondary | ICD-10-CM

## 2014-08-28 LAB — URINALYSIS, ROUTINE W REFLEX MICROSCOPIC
Bilirubin Urine: NEGATIVE
GLUCOSE, UA: NEGATIVE mg/dL
Hgb urine dipstick: NEGATIVE
KETONES UR: NEGATIVE mg/dL
Leukocytes, UA: NEGATIVE
Nitrite: NEGATIVE
PH: 7.5 (ref 5.0–8.0)
Protein, ur: NEGATIVE mg/dL
Specific Gravity, Urine: 1.007 (ref 1.005–1.030)
Urobilinogen, UA: 1 mg/dL (ref 0.0–1.0)

## 2014-08-28 LAB — GLUCOSE, CAPILLARY
GLUCOSE-CAPILLARY: 107 mg/dL — AB (ref 70–99)
Glucose-Capillary: 164 mg/dL — ABNORMAL HIGH (ref 70–99)
Glucose-Capillary: 144 mg/dL — ABNORMAL HIGH (ref 70–99)

## 2014-08-28 MED ORDER — SENNA 8.6 MG PO TABS
1.0000 | ORAL_TABLET | Freq: Two times a day (BID) | ORAL | Status: DC
  Administered 2014-08-28 – 2014-09-07 (×20): 8.6 mg via ORAL
  Filled 2014-08-28 (×24): qty 1

## 2014-08-28 MED ORDER — ENOXAPARIN SODIUM 40 MG/0.4ML ~~LOC~~ SOLN
40.0000 mg | SUBCUTANEOUS | Status: DC
  Administered 2014-08-28 – 2014-09-06 (×10): 40 mg via SUBCUTANEOUS
  Filled 2014-08-28 (×11): qty 0.4

## 2014-08-28 MED ORDER — INSULIN GLARGINE 100 UNIT/ML ~~LOC~~ SOLN
40.0000 [IU] | Freq: Every day | SUBCUTANEOUS | Status: DC
  Administered 2014-08-28 – 2014-08-30 (×3): 40 [IU] via SUBCUTANEOUS
  Filled 2014-08-28 (×4): qty 0.4

## 2014-08-28 MED ORDER — ATORVASTATIN CALCIUM 10 MG PO TABS
10.0000 mg | ORAL_TABLET | Freq: Every day | ORAL | Status: DC
  Administered 2014-08-29 – 2014-09-07 (×10): 10 mg via ORAL
  Filled 2014-08-28 (×11): qty 1

## 2014-08-28 MED ORDER — KETOTIFEN FUMARATE 0.025 % OP SOLN
2.0000 [drp] | Freq: Two times a day (BID) | OPHTHALMIC | Status: DC
  Administered 2014-08-28 – 2014-09-07 (×19): 2 [drp] via OPHTHALMIC
  Filled 2014-08-28 (×2): qty 5

## 2014-08-28 MED ORDER — SODIUM CHLORIDE 0.9 % IJ SOLN
3.0000 mL | Freq: Two times a day (BID) | INTRAMUSCULAR | Status: DC
  Administered 2014-08-28 – 2014-08-29 (×3): 3 mL via INTRAVENOUS

## 2014-08-28 MED ORDER — ALUM & MAG HYDROXIDE-SIMETH 200-200-20 MG/5ML PO SUSP: 30.0000 mL | ORAL | Status: DC | PRN

## 2014-08-28 MED ORDER — ALUM & MAG HYDROXIDE-SIMETH 200-200-20 MG/5ML PO SUSP: 30.0000 mL | Freq: Four times a day (QID) | ORAL | Status: DC | PRN

## 2014-08-28 MED ORDER — BISACODYL 10 MG RE SUPP: 10.0000 mg | Freq: Every day | RECTAL | Status: DC | PRN

## 2014-08-28 MED ORDER — FAMOTIDINE 40 MG PO TABS
40.0000 mg | ORAL_TABLET | Freq: Every day | ORAL | Status: DC
  Administered 2014-08-29 – 2014-09-07 (×10): 40 mg via ORAL
  Filled 2014-08-28 (×11): qty 1

## 2014-08-28 MED ORDER — INSULIN ASPART 100 UNIT/ML ~~LOC~~ SOLN
15.0000 [IU] | Freq: Every day | SUBCUTANEOUS | Status: DC
  Administered 2014-08-30 – 2014-09-03 (×5): 15 [IU] via SUBCUTANEOUS

## 2014-08-28 MED ORDER — CYCLOBENZAPRINE HCL 10 MG PO TABS
10.0000 mg | ORAL_TABLET | Freq: Three times a day (TID) | ORAL | Status: DC | PRN
  Administered 2014-08-29 – 2014-09-06 (×18): 10 mg via ORAL
  Filled 2014-08-28 (×19): qty 1

## 2014-08-28 MED ORDER — ASPIRIN EC 81 MG PO TBEC
81.0000 mg | DELAYED_RELEASE_TABLET | Freq: Every day | ORAL | Status: DC
  Administered 2014-08-29 – 2014-09-07 (×10): 81 mg via ORAL
  Filled 2014-08-28 (×11): qty 1

## 2014-08-28 MED ORDER — ALBUTEROL SULFATE (2.5 MG/3ML) 0.083% IN NEBU: 2.5000 mg | INHALATION_SOLUTION | RESPIRATORY_TRACT | Status: DC | PRN

## 2014-08-28 MED ORDER — INSULIN ASPART 100 UNIT/ML ~~LOC~~ SOLN
25.0000 [IU] | Freq: Every day | SUBCUTANEOUS | Status: DC
  Administered 2014-08-30 – 2014-08-31 (×2): 25 [IU] via SUBCUTANEOUS

## 2014-08-28 MED ORDER — ROPINIROLE HCL 1 MG PO TABS
1.0000 mg | ORAL_TABLET | Freq: Every day | ORAL | Status: DC
  Administered 2014-08-28 – 2014-09-06 (×10): 1 mg via ORAL
  Filled 2014-08-28 (×13): qty 1

## 2014-08-28 MED ORDER — ESTROGENS, CONJUGATED 0.625 MG/GM VA CREA
1.0000 | TOPICAL_CREAM | Freq: Every day | VAGINAL | Status: DC
  Administered 2014-08-28 – 2014-09-06 (×10): 1 via VAGINAL
  Filled 2014-08-28: qty 42.5

## 2014-08-28 MED ORDER — INSULIN GLARGINE 100 UNIT/ML ~~LOC~~ SOLN
25.0000 [IU] | Freq: Every day | SUBCUTANEOUS | Status: DC
  Administered 2014-08-29 – 2014-09-07 (×10): 25 [IU] via SUBCUTANEOUS
  Filled 2014-08-28 (×10): qty 0.25

## 2014-08-28 MED ORDER — INSULIN ASPART 100 UNIT/ML ~~LOC~~ SOLN
0.0000 [IU] | Freq: Every day | SUBCUTANEOUS | Status: DC
Start: 2014-08-28 — End: 2014-09-07

## 2014-08-28 MED ORDER — GABAPENTIN 100 MG PO CAPS
100.0000 mg | ORAL_CAPSULE | Freq: Every evening | ORAL | Status: DC
  Administered 2014-08-29 – 2014-09-01 (×4): 100 mg via ORAL
  Filled 2014-08-28 (×5): qty 1

## 2014-08-28 MED ORDER — CARVEDILOL 12.5 MG PO TABS
12.5000 mg | ORAL_TABLET | Freq: Two times a day (BID) | ORAL | Status: DC
  Administered 2014-08-29 – 2014-09-07 (×19): 12.5 mg via ORAL
  Filled 2014-08-28 (×21): qty 1

## 2014-08-28 MED ORDER — ACETAMINOPHEN 325 MG PO TABS
325.0000 mg | ORAL_TABLET | ORAL | Status: DC | PRN
Start: 2014-08-28 — End: 2014-09-07
  Administered 2014-08-29 – 2014-09-07 (×15): 650 mg via ORAL
  Filled 2014-08-28 (×15): qty 2

## 2014-08-28 MED ORDER — MENTHOL 3 MG MT LOZG
1.0000 | LOZENGE | OROMUCOSAL | Status: DC | PRN
  Filled 2014-08-28: qty 9

## 2014-08-28 MED ORDER — PHENOL 1.4 % MT LIQD
1.0000 | OROMUCOSAL | Status: DC | PRN
  Filled 2014-08-28: qty 177

## 2014-08-28 MED ORDER — LISINOPRIL 10 MG PO TABS
10.0000 mg | ORAL_TABLET | Freq: Every day | ORAL | Status: DC
  Administered 2014-08-29 – 2014-09-07 (×10): 10 mg via ORAL
  Filled 2014-08-28 (×12): qty 1

## 2014-08-28 MED ORDER — AMLODIPINE BESYLATE 2.5 MG PO TABS
2.5000 mg | ORAL_TABLET | Freq: Every day | ORAL | Status: DC
  Administered 2014-08-29 – 2014-09-07 (×10): 2.5 mg via ORAL
  Filled 2014-08-28 (×12): qty 1

## 2014-08-28 MED ORDER — PROCHLORPERAZINE EDISYLATE 5 MG/ML IJ SOLN
5.0000 mg | Freq: Four times a day (QID) | INTRAMUSCULAR | Status: DC | PRN
  Filled 2014-08-28: qty 2

## 2014-08-28 MED ORDER — IPRATROPIUM-ALBUTEROL 0.5-2.5 (3) MG/3ML IN SOLN: 3.0000 mL | Freq: Every day | RESPIRATORY_TRACT | Status: DC | PRN

## 2014-08-28 MED ORDER — MOMETASONE FURO-FORMOTEROL FUM 200-5 MCG/ACT IN AERO
2.0000 | INHALATION_SPRAY | Freq: Two times a day (BID) | RESPIRATORY_TRACT | Status: DC
  Administered 2014-08-28 – 2014-09-07 (×16): 2 via RESPIRATORY_TRACT
  Filled 2014-08-28 (×2): qty 8.8

## 2014-08-28 MED ORDER — INSULIN ASPART 100 UNIT/ML ~~LOC~~ SOLN
0.0000 [IU] | Freq: Three times a day (TID) | SUBCUTANEOUS | Status: DC
  Administered 2014-08-29 – 2014-08-31 (×3): 1 [IU] via SUBCUTANEOUS
  Administered 2014-08-31: 2 [IU] via SUBCUTANEOUS
  Administered 2014-09-01 – 2014-09-06 (×7): 1 [IU] via SUBCUTANEOUS

## 2014-08-28 MED ORDER — GUAIFENESIN-DM 100-10 MG/5ML PO SYRP: 5.0000 mL | ORAL_SOLUTION | Freq: Four times a day (QID) | ORAL | Status: DC | PRN

## 2014-08-28 MED ORDER — FLEET ENEMA 7-19 GM/118ML RE ENEM: 1.0000 | ENEMA | Freq: Once | RECTAL | Status: AC | PRN

## 2014-08-28 MED ORDER — PROCHLORPERAZINE 25 MG RE SUPP
12.5000 mg | Freq: Four times a day (QID) | RECTAL | Status: DC | PRN
Start: 2014-08-28 — End: 2014-09-07
  Filled 2014-08-28: qty 1

## 2014-08-28 MED ORDER — FUROSEMIDE 20 MG PO TABS
20.0000 mg | ORAL_TABLET | Freq: Every day | ORAL | Status: DC | PRN
  Filled 2014-08-28: qty 1

## 2014-08-28 MED ORDER — PROCHLORPERAZINE MALEATE 5 MG PO TABS
5.0000 mg | ORAL_TABLET | Freq: Four times a day (QID) | ORAL | Status: DC | PRN
  Filled 2014-08-28: qty 2

## 2014-08-28 MED ORDER — TRAZODONE HCL 50 MG PO TABS
25.0000 mg | ORAL_TABLET | Freq: Every evening | ORAL | Status: DC | PRN
  Administered 2014-09-06: 50 mg via ORAL
  Filled 2014-08-28 (×2): qty 1

## 2014-08-28 MED ORDER — POLYETHYLENE GLYCOL 3350 17 G PO PACK
17.0000 g | PACK | Freq: Every day | ORAL | Status: DC | PRN
  Filled 2014-08-28: qty 1

## 2014-08-28 MED ORDER — LEVOTHYROXINE SODIUM 75 MCG PO TABS
75.0000 ug | ORAL_TABLET | Freq: Every day | ORAL | Status: DC
  Administered 2014-08-29 – 2014-09-07 (×10): 75 ug via ORAL
  Filled 2014-08-28 (×11): qty 1

## 2014-08-28 MED ORDER — OXYCODONE HCL 5 MG PO TABS
5.0000 mg | ORAL_TABLET | ORAL | Status: DC | PRN
  Administered 2014-08-28 – 2014-08-29 (×2): 10 mg via ORAL
  Filled 2014-08-28 (×2): qty 2

## 2014-08-28 NOTE — Progress Notes (Signed)
Placed patient on CPAP at 9cm with oxygen set at 2lpm

## 2014-08-28 NOTE — Progress Notes (Signed)
Physical Therapy Treatment Patient Details Name: Kimberly [name] MRN: [id number] DOB: [birth year] Today's Date: 08/28/2014    History of Present Illness 58 yo female s/p L2-3 decompression laminectom on 08/23/14, on bedrest ubtil 08/27/14.Marland Kitchen  PMH: CAD, GERD, HR, HTN, DM, Arthritis, CHF, Glaucoma, CVA 11/2013, Sz, IBS, BIL LE knee arthorscopy, back surg    PT Comments    Pt was able to both transfer and to stand and walk with RW today.  She required 3-4 person assist to do so safely in the acute care setting.  She continues to be an excellent inpatient rehab candidate and they have tools that will make her gait progression safer and with less people to assist (the light gait assisted gait sling).  She is very motivated to get better and wants to be as independent as possible.  PT will continue to follow acutely.   Follow Up Recommendations  CIR     Equipment Recommendations  None recommended by PT    Recommendations for Other Services   NA     Precautions / Restrictions Precautions Precautions: Fall;Back Precaution Booklet Issued: Yes (comment) Precaution Comments: R leg buckles with WB, back handout reviewed as well as back precautions, lifting restrictions, activity progression    Mobility  Bed Mobility Overal bed mobility: Needs Assistance Bed Mobility: Rolling;Sidelying to Sit Rolling: Min guard Sidelying to sit: Min guard       General bed mobility comments: Min guard assist to help terminally flex right knee to get in alignment with left knee to preform the log roll.  Pt using bed rail for leverage.    Transfers Overall transfer level: Needs assistance Equipment used: Rolling walker (2 wheeled) Transfers: Sit to/from Stand Sit to Stand: +2 physical assistance;Min assist Stand pivot transfers: Mod assist (+3 mod assist)       General transfer comment: Three people assisted with stand pivot transfer with RW.  Two to support trunk and one to stabilize and help progress  her right leg as she was stepping and moving.    Ambulation/Gait Ambulation/Gait assistance: Mod assist (+4 mod assist.  ) Ambulation Distance (Feet): 5 Feet Assistive device: Rolling walker (2 wheeled) Gait Pattern/deviations: Step-to pattern;Decreased dorsiflexion - right;Decreased stance time - right;Decreased weight shift to right Gait velocity: decreased Gait velocity interpretation: <1.8 ft/sec, indicative of risk for recurrent falls General Gait Details: Two person mod assist to support pt's trunk, one person to stabilize and help progress right leg during swing and stance phase of gait and forth person to follow closely with the recliner chair to help encourage maximal gait distance.           Balance Overall balance assessment: Needs assistance Sitting-balance support: Feet supported;No upper extremity supported;Bilateral upper extremity supported Sitting balance-Leahy Scale: Good     Standing balance support: Bilateral upper extremity supported Standing balance-Leahy Scale: Poor Standing balance comment: needs mod physical assist overall, but 3-4 people to maximize her gait and transfer ability.                      Cognition Arousal/Alertness: Awake/alert Behavior During Therapy: WFL for tasks assessed/performed (mildly anxious) Overall Cognitive Status: Within Functional Limits for tasks assessed                             Pertinent Vitals/Pain Pain Assessment: 0-10 Pain Score: 8  Pain Location: incisional, pain into right leg Pain Descriptors / Indicators: Aching;Burning Pain Intervention(s):  Limited activity within patient's tolerance;Monitored during session;Repositioned           PT Goals (current goals can now be found in the care plan section) Acute Rehab PT Goals Patient Stated Goal: to return home, be able to walk. Progress towards PT goals: Progressing toward goals    Frequency  Min 5X/week    PT Plan Discharge plan needs to  be updated    Co-evaluation PT/OT/SLP Co-Evaluation/Treatment: Yes Reason for Co-Treatment: Complexity of the patient's impairments (multi-system involvement);For patient/therapist safety PT goals addressed during session: Mobility/safety with mobility;Proper use of DME;Strengthening/ROM       End of Session Equipment Utilized During Treatment: Gait belt Activity Tolerance: Patient limited by pain Patient left: in chair;with call bell/phone within reach;with chair alarm set     Time: [phone] (subtracted 8 mins for pt sitting on toilet) PT Time Calculation (min): 24 min  Charges:  $Therapeutic Activity: 8-22 mins                     Bonna B. Dyon Rotert, PT, DPT [id number]#[phone]   08/28/2014, 12:06 PM

## 2014-08-28 NOTE — PMR Pre-admission (Signed)
PMR Admission Coordinator Pre-Admission Assessment  Patient: Kimberly [name] is an 58 y.o., female MRN: [id number] DOB: Mar 15, 1956 Height: 5' (152.4 cm) Weight: 110.678 kg (244 lb)              Insurance Information HMO: yes    PPO:      PCP:      IPA:      80/20:      OTHER:  PRIMARY: Blue Medicare      Policy#: [id number]      Subscriber: self CM Name: Kimberly Partridge, RN      Phone#: [phone]     Fax#: [phone] Verbal approval given from Kimberly [name] for seven days from [name] through 09-04-14 with updates due to Kimberly [name] on 41-58-30 Pre-Cert#:                                   Employer: disabled Benefits:  Phone #: [phone]     Name: Kimberly [name]. Date: 11-22-13     Deduct: none      Out of Pocket Max: $4500 (met $2346.61)      Life Max: none CIR: $250/day for days 1-6, pre-auth needed      SNF: $0/day for days 1-20, $60/day for days 21-100 (100 day visit limit, pre-auth needed) Outpatient: 100%     Co-Pay: $35 copay, no visit limits, pre-auth needed for SLP only Home Health: 100%, pre-auth needed      Co-Pay: no copay, no visit limits DME: 80%     Co-Pay: 20% (no auth needed for equipment < $600) Providers: in network  Emergency Contact Information Contact Information   Name Relation Home Work Mobile   Kimberly [name] Spouse Kimberly [name] Son   [phone]     Current Medical History  Patient Admitting Diagnosis: L2-3 stenosis with radiculopathy  History of Present Illness: Kimberly [name] is a 58 y.o. female with history of HTN, DM type 2 with neuropathy, CVA 1/15,  severe back and LE pain due to L2/3 spondylosis with severe stenosis and radiculopathy. Patient elected to undergo L2-3 decompressive lam with repair of complex dural laceration on 08/23/14 by Kimberly [name]. Post op on bedrest till 10/05 and patient with reports of RLE numbness and heaviness. OT evaluation done today and patient with RLE instability requiring blocking to prevent buckling with  transfer. MD recommending CIR for progressive therapy.    Past Medical History  Past Medical History  Diagnosis Date  . CAD (coronary artery disease)     nonobstructive by cath, 6/08 (false positive Cardiolite) normal stress echo, 7/11  . Heart failure, diastolic, chronic   . GERD (gastroesophageal reflux disease)   . Morbid obesity   . Obstructive sleep apnea   . IDDM (insulin dependent diabetes mellitus)   . HTN (hypertension)   . Shortness of breath   . Asthma   . Spinal stenosis   . Chronic back pain   . Arthritis   . Degenerative joint disease   . Glaucoma   . CHF (congestive heart failure)   . Complication of anesthesia     had an asthma attack when woke up from procedure  . Stroke     12/09/2013  . Pneumonia     as a child  . Hypothyroidism   . Depression   . Hernia of abdominal cavity     "upper and lower hernia"  . Seizures  PMH: only as a child  . Heart murmur     PMH:As a child only  . Headache(784.0)   . Neuropathy     associated with diabetes  . Anemia     PMH: as a child  . Fatty liver   . Pancreatitis   . IBS (irritable bowel syndrome)   . Rheumatic fever     PMH: as a child    Family History  family history includes Cancer in her other; Colon cancer in her brother; Heart failure in her other.  Prior Rehab/Hospitalizations: pt has had previous outpt therapy for neck and back issues.    Current Medications  Current facility-administered medications:0.9 %  sodium chloride infusion, 250 mL, Intravenous, Continuous, Kimberly Pitter, MD, Last Rate: 1 mL/hr at 08/23/14 1745, 250 mL at 08/23/14 1745;  acetaminophen (TYLENOL) suppository 650 mg, 650 mg, Rectal, Q4H PRN, Kimberly Pitter, MD;  acetaminophen (TYLENOL) tablet 650 mg, 650 mg, Oral, Q4H PRN, Kimberly Pitter, MD albuterol (PROVENTIL) (2.5 MG/3ML) 0.083% nebulizer solution 2.5 mg, 2.5 mg, Nebulization, BID, Kimberly Pitter, MD, 2.5 mg at 08/28/14 1740;  alum & mag hydroxide-simeth (MAALOX/MYLANTA)  200-200-20 MG/5ML suspension 30 mL, 30 mL, Oral, Q6H PRN, Kimberly Pitter, MD;  amLODipine (NORVASC) tablet 2.5 mg, 2.5 mg, Oral, Daily, Kimberly Pitter, MD, 2.5 mg at 08/28/14 0816;  aspirin EC tablet 81 mg, 81 mg, Oral, Daily, Kimberly Pitter, MD, 81 mg at 08/28/14 0816 atorvastatin (LIPITOR) tablet 10 mg, 10 mg, Oral, Daily, Kimberly Pitter, MD, 10 mg at 08/28/14 0816;  bisacodyl (DULCOLAX) suppository 10 mg, 10 mg, Rectal, Daily PRN, Kimberly Pitter, MD, 10 mg at 08/28/14 1053;  carvedilol (COREG) tablet 12.5 mg, 12.5 mg, Oral, BID WC, Kimberly Pitter, MD, 12.5 mg at 08/28/14 0816 conjugated estrogens (PREMARIN) vaginal cream 1 Applicatorful, 1 Applicatorful, Vaginal, QHS, Kimberly Pitter, MD, 1 Applicatorful at 81/44/81 2137;  cyclobenzaprine (FLEXERIL) tablet 10 mg, 10 mg, Oral, TID PRN, Kimberly Pitter, MD, 10 mg at 08/28/14 1253;  doxycycline (ADOXA) tablet 100 mg, 100 mg, Oral, BID, Kimberly Pitter, MD, 100 mg at 08/28/14 8563;  famotidine (PEPCID) tablet 40 mg, 40 mg, Oral, Daily, Kimberly Pitter, MD, 40 mg at 08/28/14 0816 furosemide (LASIX) tablet 20-40 mg, 20-40 mg, Oral, Daily PRN, Kimberly Pitter, MD;  gabapentin (NEURONTIN) capsule 100 mg, 100 mg, Oral, QPM, Kimberly Pitter, MD, 100 mg at 08/27/14 1828;  HYDROcodone-acetaminophen (NORCO/VICODIN) 5-325 MG per tablet 1-2 tablet, 1-2 tablet, Oral, Q4H PRN, Kimberly Pitter, MD;  HYDROmorphone (DILAUDID) injection 0.5-1 mg, 0.5-1 mg, Intravenous, Q2H PRN, Kimberly Pitter, MD, 1 mg at 08/25/14 0759 insulin aspart (novoLOG) injection 0-20 Units, 0-20 Units, Subcutaneous, TID WC, Kimberly Pitter, MD, 4 Units at 08/28/14 1252;  insulin aspart (novoLOG) injection 15 Units, 15 Units, Subcutaneous, Q lunch, Kimberly Pitter, MD, 15 Units at 08/28/14 1253;  insulin aspart (novoLOG) injection 25 Units, 25 Units, Subcutaneous, Q supper, Kimberly Pitter, MD, 25 Units at 08/27/14 1728 insulin glargine (LANTUS) injection 25 Units, 25 Units, Subcutaneous, Daily, Kimberly Pitter, MD, 25 Units at 08/27/14 1036;  insulin  glargine (LANTUS) injection 40 Units, 40 Units, Subcutaneous, QHS, Kimberly Pitter, MD, 40 Units at 08/26/14 2129;  ipratropium-albuterol (DUONEB) 0.5-2.5 (3) MG/3ML nebulizer solution 3 mL, 3 mL, Nebulization, Daily PRN, Kimberly Pitter, MD ketotifen (ZADITOR) 0.025 % ophthalmic solution 2 drop, 2 drop, Both Eyes, BID, Kimberly Pitter, MD, 2 drop at  08/28/14 0817;  levothyroxine (SYNTHROID, LEVOTHROID) tablet 75 mcg, 75 mcg, Oral, QAC breakfast, Kimberly Pitter, MD, 75 mcg at 08/28/14 0815;  lisinopril (PRINIVIL,ZESTRIL) tablet 10 mg, 10 mg, Oral, Daily, Kimberly Pitter, MD, 10 mg at 08/28/14 0816;  menthol-cetylpyridinium (CEPACOL) lozenge 3 mg, 1 lozenge, Oral, PRN, Kimberly Pitter, MD mometasone-formoterol [facility]) 200-5 MCG/ACT inhaler 2 puff, 2 puff, Inhalation, BID, Kimberly Pitter, MD, 2 puff at 08/28/14 0037;  ondansetron [facility]) injection 4 mg, 4 mg, Intravenous, Q4H PRN, Kimberly Pitter, MD, 4 mg at 08/23/14 2133;  oxyCODONE-acetaminophen (PERCOCET/ROXICET) 5-325 MG per tablet 1-2 tablet, 1-2 tablet, Oral, Q4H PRN, Kimberly Pitter, MD, 2 tablet at 08/28/14 1253 phenol (CHLORASEPTIC) mouth spray 1 spray, 1 spray, Mouth/Throat, PRN, Kimberly Pitter, MD;  polyethylene glycol (MIRALAX / GLYCOLAX) packet 17 g, 17 g, Oral, Daily PRN, Kimberly Pitter, MD, 17 g at 08/27/14 2132;  rOPINIRole (REQUIP) tablet 1 mg, 1 mg, Oral, QHS, Kimberly Pitter, MD, 1 mg at 08/27/14 2130;  senna (SENOKOT) tablet 8.6 mg, 1 tablet, Oral, BID, Kimberly Pitter, MD, 8.6 mg at 08/28/14 0816 sodium chloride 0.9 % injection 3 mL, 3 mL, Intravenous, Q12H, Kimberly Pitter, MD, 3 mL at 08/28/14 0817;  sodium chloride 0.9 % injection 3 mL, 3 mL, Intravenous, PRN, Kimberly Pitter, MD  Patients Current Diet: Carb Control  Precautions / Restrictions Precautions Precautions: Fall;Back Precaution Booklet Issued: Yes (comment) Precaution Comments: RLE buckling with WB Restrictions Weight Bearing Restrictions: No   Prior Activity Level Limited Community (1-2x/wk): Pt does  not do the driving but got out with her husband 1-2 x/week on errands or to go out to eat. Pt used rolling walker at home and uses cane in community. She enjoys doing crafts like cross stitch and needlepoint. Pt has been on disability since 2010 for back issues.  Home Assistive Devices / Equipment Home Assistive Devices/Equipment: CPAP;Eyeglasses;Walker (specify type);Shower chair with back Home Equipment: [place] - 2 wheels;Cane - single point;Shower seat;Hand held shower head  Prior Functional Level Prior Function Level of Independence: Independent with assistive device(s) Comments: reports using RW in the house and cane at the grocery store  Current Functional Level Cognition  Overall Cognitive Status: Within Functional Limits for tasks assessed Orientation Level: Oriented X4    Extremity Assessment (includes Sensation/Coordination)          ADLs  Overall ADL's : Needs assistance/impaired Eating/Feeding: Independent;Bed level Grooming: Oral care;Wash/dry face;Sitting;Modified independent Grooming Details (indicate cue type and reason): completed oral care in chair Upper Body Bathing: Minimal assitance;Sitting Lower Body Bathing: Total assistance;Sit to/from stand Toilet Transfer: +2 for physical assistance;+2 for safety/equipment;Minimal assistance (with 3rd person tactile input for knee extension, quad ) Toilet Transfer Details (indicate cue type and reason): pt requires (A) To block R LE to prevent buckling Functional mobility during ADLs: +2 for physical assistance;Minimal assistance;Rolling walker General ADL Comments: Pt at eob with PT Becca on arrival. pt transferring to [facility] with total +3 (A) due to RLE buckle. Third person providing tactile input for quad tightening to prevent buckle and two staff members helping with UB during transfer. Pt needs cues for hand placement and safety. Pt is total (A) for peri care. Pt able to void easier on BSC compared to previous use of bed pan     Mobility  Overal bed mobility: Needs Assistance Bed Mobility: Rolling;Sidelying to Sit Rolling: Min guard Sidelying to sit: Min guard Supine to sit: Min assist General bed mobility comments:  on EOB on arrival    Transfers  Overall transfer level: Needs assistance Equipment used: Rolling walker (2 wheeled) Transfers: Sit to/from [facility] Sit to Stand: +2 physical assistance;Min assist (third person RLE prevent buckle) Stand pivot transfers: Mod assist (+3) General transfer comment: pt required (A) to prevent RLE buckle and encouragement due to incr pain in RLE with WB    Ambulation / Gait / Stairs / Wheelchair Mobility  Ambulation/Gait Ambulation/Gait assistance: Mod assist (+4 mod assist.  ) Ambulation Distance (Feet): 5 Feet Assistive device: Rolling walker (2 wheeled) Gait Pattern/deviations: Step-to pattern;Decreased dorsiflexion - right;Decreased stance time - right;Decreased weight shift to right Gait velocity: decreased Gait velocity interpretation: <1.8 ft/sec, indicative of risk for recurrent falls General Gait Details: Two person mod assist to support pt's trunk, one person to stabilize and help progress right leg during swing and stance phase of gait and forth person to follow closely with the recliner chair to help encourage maximal gait distance.     Posture / Balance      Special needs/care consideration BiPAP/CPAP - wears CPAP at night with 2.5 L O2 CPM no  Continuous Drip IV no  Dialysis no         Life Vest no  Oxygen - uses 2.5 L O2 at night only with her CPAP Special Bed no  Trach Size no  Wound Vac (area) no        Skin - current lumbar back incision                               Bowel mgmt: last BM on [name] Bladder mgmt: using bedpan Diabetic mgmt - yes, managed at home with insulin   Previous Home Environment Living Arrangements: Spouse/significant other Available Help at Discharge: Family;Available PRN/intermittently (spouse  works) Type of Home: House Home Layout: One level Home Access: Stairs to enter Entrance Stairs-Rails: None Technical brewer of Steps: 3 Bathroom Shower/Tub: Chiropodist: [place]: Yes Type of Home Care Services: Home RT  Discharge Living Setting Plans for Discharge Living Setting: Patient's home Type of Home at Discharge: House Discharge Home Layout: One level Discharge Home Access: Stairs to enter Entrance Stairs-Rails: None Entrance Stairs-Number of Steps: 3 Does the patient have any problems obtaining your medications?: No  Social/Family/Support Systems Patient Roles: Spouse Contact Information: husband Sonia Side  Anticipated Caregiver: husband and friend [place] Anticipated Caregiver's Contact Information: see above Ability/Limitations of Caregiver: husband does work 3rd shift, friend is available as needed Caregiver Availability: Intermittent Discharge Plan Discussed with Primary Caregiver: Yes Is Caregiver In Agreement with Plan?: Yes Does Caregiver/Family have Issues with Lodging/Transportation while Pt is in Rehab?: No  Goals/Additional Needs Patient/Family Goal for Rehab: Mod Ind with PT, Mod Ind and supervision with OT, NA for SLP Expected length of stay: 10-14 days Cultural Considerations: none Dietary Needs: carb modified Equipment Needs: to be determined Pt/Family Agrees to Admission and willing to participate: Yes Program Orientation Provided & Reviewed with Pt/Caregiver Including Roles  & Responsibilities: Yes   Decrease burden of Care through IP rehab admission: NA  Possible need for SNF placement upon discharge: not anticipated   Patient Condition: This patient's condition remains as documented in the consult dated 08-27-14, in which the Rehabilitation Physician determined and documented that the patient's condition is appropriate for intensive rehabilitative care in an inpatient rehabilitation facility. Will  admit to inpatient rehab today.  Preadmission Screen  Completed By:  Nanetta Batty, PT, 08/28/2014 2:47 PM ______________________________________________________________________   Discussed status with Dr. Naaman Plummer on [name] at 1456 and received telephone approval for admission today.  Admission Coordinator:  Nanetta Batty, PT, time1456/Date [name]

## 2014-08-28 NOTE — Progress Notes (Signed)
Occupational Therapy Treatment Patient Details Name: Kimberly [name] MRN: [id number] DOB: 1955-12-01 Today's Date: 08/28/2014    History of present illness 58 yo female s/p L2-3 decompression laminectom on 08/23/14, on bedrest ubtil 08/27/14.Marland Kitchen  PMH: CAD, GERD, HR, HTN, DM, Arthritis, CHF, Glaucoma, CVA 11/2013, Sz, IBS, BIL LE knee arthorscopy, back surg   OT comments  Pt (A)ed with OT / PT to [facility] to void bowels. Pt demonstrates continued buckling of RLE. Pt progressed with brief ambulation with total +3 (A) to prevent buckle. Pt able to use RW this session which is an advancement. OT to continue to follow acutely for adl retraining.    Follow Up Recommendations  CIR;Other (comment) (SNF if CIR denies)    Equipment Recommendations  Other (comment) (defer)    Recommendations for Other Services      Precautions / Restrictions Precautions Precautions: Fall;Back Precaution Booklet Issued: Yes (comment) Precaution Comments: RLE buckling with WB       Mobility Bed Mobility Overal bed mobility: Needs Assistance Bed Mobility: Rolling;Sidelying to Sit Rolling: Min guard Sidelying to sit: Min guard       General bed mobility comments: on EOB on arrival  Transfers Overall transfer level: Needs assistance Equipment used: Rolling walker (2 wheeled) Transfers: Sit to/from [employer] Sit to Stand: +2 physical assistance;Min assist (third person RLE prevent buckle) Stand pivot transfers: Mod assist (+3)       General transfer comment: pt required (A) to prevent RLE buckle and encouragement due to incr pain in RLE with WB    Balance Overall balance assessment: Needs assistance Sitting-balance support: Feet supported;No upper extremity supported;Bilateral upper extremity supported Sitting balance-Leahy Scale: Good     Standing balance support: Bilateral upper extremity supported;During functional activity Standing balance-Leahy Scale: Poor Standing balance  comment: cues for hand placement and tactile input to help with facilitation of RLE to prevent buckle                   ADL                           Toilet Transfer: +2 for physical assistance;+2 for safety/equipment;Minimal assistance (with 3rd person tactile input for knee extension, quad ) Toilet Transfer Details (indicate cue type and reason): pt requires (A) To block R LE to prevent buckling         Functional mobility during ADLs: +2 for physical assistance;Minimal assistance;Rolling walker General ADL Comments: Pt at eob with PT Becca on arrival. pt transferring to [facility] with total +3 (A) due to RLE buckle. Third person providing tactile input for quad tightening to prevent buckle and two staff members helping with UB during transfer. Pt needs cues for hand placement and safety. Pt is total (A) for peri care. Pt able to void easier on BSC compared to previous use of bed pan      Vision                     Perception     Praxis      Cognition   Behavior During Therapy: [facility] for tasks assessed/performed Overall Cognitive Status: Within Functional Limits for tasks assessed                       Extremity/Trunk Assessment               Exercises     Shoulder Instructions  General Comments      Pertinent Vitals/ Pain       Pain Assessment: 0-10 Pain Score: 8  Pain Location: incision and R LE Pain Descriptors / Indicators: Aching;Burning Pain Intervention(s): Repositioned  Home Living                                          Prior Functioning/Environment              Frequency Min 2X/week     Progress Toward Goals  OT Goals(current goals can now be found in the care plan section)  Progress towards OT goals: Progressing toward goals  Acute Rehab OT Goals Patient Stated Goal: to return home, be able to walk. OT Goal Formulation: With patient Time For Goal Achievement: 09/10/14 Potential to  Achieve Goals: Good ADL Goals Pt Will Perform Grooming: with min guard assist;standing Pt Will Perform Upper Body Bathing: with min guard assist;sitting;standing Pt Will Perform Lower Body Bathing: with mod assist;with adaptive equipment;sit to/from stand Pt Will Transfer to Toilet: with min assist;bedside commode Additional ADL Goal #1: PT will verbalize 3 out 3 precautions  Plan Discharge plan needs to be updated    Co-evaluation    PT/OT/SLP Co-Evaluation/Treatment: Yes Reason for Co-Treatment: Complexity of the patient's impairments (multi-system involvement) PT goals addressed during session: Mobility/safety with mobility;Proper use of DME;Strengthening/ROM OT goals addressed during session: ADL's and self-care      End of Session Equipment Utilized During Treatment: Gait belt;Rolling walker   Activity Tolerance Patient tolerated treatment well   Patient Left in chair;with call bell/phone within reach   Nurse Communication Mobility status;Precautions        Time: [id number] OT Time Calculation (min): 16 min  Charges: OT General Charges $OT Visit: 1 Procedure (No charge due to cotreat)  Boone MasterJones, Melynda Krzywicki B 08/28/2014, 1:50 PM Pager: [phone]

## 2014-08-28 NOTE — Progress Notes (Signed)
Overall stable. Patient seems to be slowly recovering from her surgery. Back pain seems better controlled. Still having right lower extremity numbness and weakness. Progressing slowly with therapy.  Afebrile. Vitals are stable. Wound clean and dry. Chest and abdomen benign. Neurologically awake and alert. Oriented and appropriate. Motor and sensory function intact in both opportunities in her left lower trimming. Right lower chamois still limited somewhat by pain. Definitely better than antigravity in her right lower extremity.  Progressing slowly. I agree with inpatient rehabilitation transfer when bed available.

## 2014-08-28 NOTE — Progress Notes (Signed)
[facility] Marylu LundJanet, [facility] room [facility]25. All belonging sent with patient. No other distress noted.   Sim BoastHavy, RN

## 2014-08-28 NOTE — Progress Notes (Signed)
Rehab admissions - I did receive insurance authorization for inpatient rehab. I spoke with Dr. Jordan LikesPool who gave medical clearance and bed is available today. We will admit pt to inpatient rehab later today.  I called an updated Steward DroneBrenda, case management and Dysheka with social work. I updated pt by phone and will complete admission paperwork shortly with pt.  I will contact pt's husband later this afternoon as he works third shift and asked that I contact him in late afternoon with updates.  Please call me with any questions. Thanks.  Juliann MuleJanine Salomon Ganser, PT Rehabilitation Admissions Coordinator [phone]

## 2014-08-28 NOTE — H&P (Signed)
Physical Medicine and Rehabilitation Admission H&P  CC: Back and BLE pain due to L2-3 stenosis with radiculopathy  HPI: Kimberly [name] is a 58 y.o. 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 Patient elected to undergo L2-3 decompressive lam with repair of complex dural laceration on 08/23/14 by Dr. Annette Stable. Post op on bedrest till 10/05 and patient with reports of RLE numbness and heaviness. Patient continues to be limited by RLE instability requiring blocking to prevent buckling with transfer. CIR recommended by rehab team and MD.  Review of Systems  HENT: Negative for hearing loss.  Respiratory: Negative for cough, shortness of breath and wheezing.  Cardiovascular: Positive for chest pain (chronic chest wall pain).  Gastrointestinal: Positive for heartburn. Negative for nausea, vomiting and constipation.  Genitourinary: Positive for dysuria. Negative for urgency.  Musculoskeletal: Positive for back pain and myalgias.  Neurological: Positive for sensory change, focal weakness and weakness. Negative for dizziness and headaches.  Psychiatric/Behavioral: The patient is nervous/anxious and has insomnia.   Past Medical History   Diagnosis  Date   .  CAD (coronary artery disease)      nonobstructive by cath, 6/08 (false positive Cardiolite) normal stress echo, 7/11   .  Heart failure, diastolic, chronic    .  GERD (gastroesophageal reflux disease)    .  Morbid obesity    .  Obstructive sleep apnea    .  IDDM (insulin dependent diabetes mellitus)    .  HTN (hypertension)    .  Shortness of breath    .  Asthma    .  Spinal stenosis    .  Chronic back pain    .  Arthritis    .  Degenerative joint disease    .  Glaucoma    .  CHF (congestive heart failure)    .  Complication of anesthesia      had an asthma attack when woke up from procedure   .  Stroke      12/09/2013   .   Pneumonia      as a child   .  Hypothyroidism    .  Depression    .  Hernia of abdominal cavity      "upper and lower hernia"   .  Seizures      PMH: only as a child   .  Heart murmur      PMH:As a child only   .  Headache(784.0)    .  Neuropathy      associated with diabetes   .  Anemia      PMH: as a child   .  Fatty liver    .  Pancreatitis    .  IBS (irritable bowel syndrome)    .  Rheumatic fever      PMH: as a child    Past Surgical History   Procedure  Laterality  Date   .  Eye surgery   1967   .  Tubal ligation   1975   .  Partial hysterectomy   1981   .  Hemorrhoid surgery     .  Hernia repair     .  Spinahatomy   1992   .  Cholecystectomy   1994   .  Ovaries removed   2003   .  Heel (other)   2005   .  Abdominal hysterectomy     .  Bilateral knee arthroscopy   2000, 2009   .  Colonoscopy  N/A  08/01/2013     Procedure: COLONOSCOPY; Surgeon: Rogene Houston, MD; Location: [facility]; Service: Endoscopy; Laterality: N/A; 930   .  Cataract extraction w/ intraocular lens implant, bilateral     .  Back surgery     .  Appendectomy     .  Cyst excision       Left breast   .  Cardiac catheterization       2009   .  Lumbar laminectomy/decompression microdiscectomy  Right  08/23/2014     Procedure: LUMBAR LAMINECTOMY/DECOMPRESSION MICRODISCECTOMY 1 LEVEL; Surgeon: Charlie Pitter, MD; Location: [facility]; Service: Neurosurgery; Laterality: Right; LUMBAR LAMINECTOMY/DECOMPRESSION MICRODISCECTOMY 1 LEVEL LUMBAR 2-3    Family History   Problem  Relation  Age of Onset   .  Cancer  Other    .  Heart failure  Other    .  Colon cancer  Brother     Social History: Married. Used to work as a Scientist, water quality at [employer]. Disabled due to back issues since 2010. Sedentary PTA. Has been using a walker since stroke. She reports that she has never smoked. She has never used smokeless tobacco. She reports that she does not drink alcohol or use illicit drugs  Allergies   Allergen   Reactions   .  Naproxen  Anaphylaxis     REACTION: throat swelling   .  Other      Local anesthia  Had asthma attack   .  Sulfonamide Derivatives      Yeast infection   .  Latex  Rash   .  Tape  Rash     Do not use adhesive tape; okay to use paper tape.    Medications Prior to Admission   Medication  Sig  Dispense  Refill   .  albuterol (VENTOLIN HFA) 108 (90 BASE) MCG/ACT inhaler  Inhale 2 puffs into the lungs 2 (two) times daily.     Marland Kitchen  amLODipine (NORVASC) 2.5 MG tablet  Take 1 tablet (2.5 mg total) by mouth daily.  30 tablet  11   .  aspirin EC 81 MG tablet  Take 81 mg by mouth daily.     Marland Kitchen  atorvastatin (LIPITOR) 10 MG tablet  Take 10 mg by mouth daily.     .  Calcium-Vitamin D-Vitamin K (VIACTIV) 646-803-21 MG-UNT-MCG CHEW  Chew 1 capsule by mouth daily.     .  carvedilol (COREG) 12.5 MG tablet  Take 12.5 mg by mouth 2 (two) times daily with a meal.     .  conjugated estrogens (PREMARIN) vaginal cream  Place 1 Applicatorful vaginally at bedtime.     Marland Kitchen  doxycycline (ADOXA) 100 MG tablet  Take 100 mg by mouth 2 (two) times daily.     .  famotidine (PEPCID) 40 MG tablet  Take 40 mg by mouth daily.     .  Fluticasone-Salmeterol (ADVAIR DISKUS) 500-50 MCG/DOSE AEPB  Inhale 2 puffs into the lungs 2 (two) times daily.     .  furosemide (LASIX) 20 MG tablet  Take 20-40 mg by mouth daily as needed for fluid.     Marland Kitchen  gabapentin (NEURONTIN) 100 MG capsule  Take 100 mg by mouth every evening.     .  insulin aspart (NOVOLOG FLEXPEN) 100 UNIT/ML injection  Inject 15-25 Units into the skin 2 (two)  times daily. 15 units at lunch, 25 units at supper. Then sliding scale as needed.     .  insulin glargine (LANTUS SOLOSTAR) 100 UNIT/ML injection  Inject 25-40 Units into the skin 2 (two) times daily. 40 @ bedtime, 25 in the morning     .  ipratropium-albuterol (DUONEB) 0.5-2.5 (3) MG/3ML SOLN  Take 3 mLs by nebulization daily as needed (for shortness of breath).     Marland Kitchen  ketotifen (THERA TEARS ALLERGY)  0.025 % ophthalmic solution  Place 2 drops into both eyes 2 (two) times daily. *Uses drops 2 to 4 times daily*     .  Levothyroxine Sodium 75 MCG CAPS  Take 1 capsule by mouth daily.     .  Liraglutide (VICTOZA) 18 MG/3ML SOPN  Inject 1.8 mg into the skin daily.     Marland Kitchen  lisinopril (PRINIVIL,ZESTRIL) 10 MG tablet  Take 1 tablet (10 mg total) by mouth daily.  30 tablet  11   .  NON FORMULARY  CPAP: use as directed     .  OXYGEN-HELIUM IN  Inhale 2.5 L into the lungs at bedtime. OXYGEN 2L: use as directed with cpap     .  pantoprazole (PROTONIX) 40 MG tablet  Take 1 tablet (40 mg total) by mouth 2 (two) times daily.  60 tablet  0   .  rOPINIRole (REQUIP) 1 MG tablet  Take 1 mg by mouth at bedtime.     Marland Kitchen  HYDROcodone-acetaminophen (NORCO) 7.5-325 MG per tablet  Take 1 tablet by mouth daily as needed for moderate pain.      Home:  Home Living  Family/patient expects to be discharged to:: Private residence  Living Arrangements: Spouse/significant other  Available Help at Discharge: Family;Available PRN/intermittently (spouse works)  Type of Home: House  Home Access: Stairs to enter  Technical brewer of Steps: 3  Entrance Stairs-Rails: None  Home Layout: One level  Home Equipment: Environmental consultant - 2 wheels;Cane - single point;Shower seat;Hand held shower head  Functional History:  Prior Function  Level of Independence: Independent with assistive device(s)  Comments: reports using RW in the house and cane at the grocery store  Functional Status:  Mobility:  Bed Mobility  Overal bed mobility: Needs Assistance  Bed Mobility: Rolling;Sidelying to Sit  Rolling: Min guard  Sidelying to sit: Min guard  Supine to sit: Min assist  General bed mobility comments: on EOB on arrival  Transfers  Overall transfer level: Needs assistance  Equipment used: Rolling walker (2 wheeled)  Transfers: Sit to/from [facility]  Sit to Stand: +2 physical assistance;Min assist (third person RLE  prevent buckle)  Stand pivot transfers: Mod assist (+3)  General transfer comment: pt required (A) to prevent RLE buckle and encouragement due to incr pain in RLE with WB  Ambulation/Gait  Ambulation/Gait assistance: Mod assist (+4 mod assist. )  Ambulation Distance (Feet): 5 Feet  Assistive device: Rolling walker (2 wheeled)  Gait Pattern/deviations: Step-to pattern;Decreased dorsiflexion - right;Decreased stance time - right;Decreased weight shift to right  Gait velocity: decreased  Gait velocity interpretation: <1.8 ft/sec, indicative of risk for recurrent falls  General Gait Details: Two person mod assist to support pt's trunk, one person to stabilize and help progress right leg during swing and stance phase of gait and forth person to follow closely with the recliner chair to help encourage maximal gait distance.   ADL:  ADL  Overall ADL's : Needs assistance/impaired  Eating/Feeding: Independent;Bed level  Grooming: Oral care;Wash/dry  face;Sitting;Modified independent  Grooming Details (indicate cue type and reason): completed oral care in chair  Upper Body Bathing: Minimal assitance;Sitting  Lower Body Bathing: Total assistance;Sit to/from stand  Toilet Transfer: +2 for physical assistance;+2 for safety/equipment;Minimal assistance (with 3rd person tactile input for knee extension, quad )  Toilet Transfer Details (indicate cue type and reason): pt requires (A) To block R LE to prevent buckling  Functional mobility during ADLs: +2 for physical assistance;Minimal assistance;Rolling walker  General ADL Comments: Pt at eob with PT Becca on arrival. pt transferring to [facility] with total +3 (A) due to RLE buckle. Third person providing tactile input for quad tightening to prevent buckle and two staff members helping with UB during transfer. Pt needs cues for hand placement and safety. Pt is total (A) for peri care. Pt able to void easier on BSC compared to previous use of bed pan  Cognition:   Cognition  Overall Cognitive Status: Within Functional Limits for tasks assessed  Orientation Level: Oriented X4  Cognition  Arousal/Alertness: Awake/alert  Behavior During Therapy: WFL for tasks assessed/performed  Overall Cognitive Status: Within Functional Limits for tasks assessed   PHYSICAL EXAM: Blood pressure 133/64, pulse 64, temperature 97.9 F (36.6 C), temperature source Oral, resp. rate 16, height 5' (1.524 m), weight 110.678 kg (244 lb), SpO2 94.00%.   Constitutional: She is oriented to person, place, and time. She appears well-developed and well-nourished.  HENT: oral mucosa pink and moist Head: Normocephalic and atraumatic.  Eyes: Conjunctivae are normal. Pupils are equal, round, and reactive to light.  Neck: Normal range of motion. Neck supple.  Cardiovascular: Normal rate and regular rhythm.  Respiratory: Effort normal and breath sounds normal. No respiratory distress. She has no wheezes.  GI: Soft. Bowel sounds are normal. She exhibits no distension. There is no tenderness.  Musculoskeletal:  Low back tenderness.  Neurological: She is alert and oriented to person, place, and time. CN Exam normal. Mentation appropriate.  Decreased sensation to PP and LT RLE more than LLE. RLE: 1-2/5 HF, KE and 2/5 ADF/APF.  DTR"s 1+ in LE's. Skin: Skin is warm and dry. Back incision with moderate serosanguinous drainage on dressing.  Psychiatric: Her speech is normal. Thought content normal. Her affect is blunt.    Results for orders placed during the hospital encounter of 08/23/14 (from the past 48 hour(s))   GLUCOSE, CAPILLARY Status: Abnormal    Collection Time    08/26/14 4:23 PM   Result  Value  Ref Range    Glucose-Capillary  108 (*)  70 - 99 mg/dL    Comment 1  Notify RN     Comment 2  Documented in Chart    GLUCOSE, CAPILLARY Status: Abnormal    Collection Time    08/26/14 9:51 PM   Result  Value  Ref Range    Glucose-Capillary  231 (*)  70 - 99 mg/dL    Comment 1   Notify RN     Comment 2  Documented in Chart    GLUCOSE, CAPILLARY Status: Abnormal    Collection Time    08/27/14 6:52 AM   Result  Value  Ref Range    Glucose-Capillary  102 (*)  70 - 99 mg/dL    Comment 1  Notify RN     Comment 2  Documented in Chart    GLUCOSE, CAPILLARY Status: Abnormal    Collection Time    08/27/14 12:06 PM   Result  Value  Ref Range    Glucose-Capillary  159 (*)  70 - 99 mg/dL    Comment 1  Notify RN     Comment 2  Documented in Chart    GLUCOSE, CAPILLARY Status: Abnormal    Collection Time    08/27/14 4:49 PM   Result  Value  Ref Range    Glucose-Capillary  121 (*)  70 - 99 mg/dL    Comment 1  Notify RN     Comment 2  Documented in Chart    GLUCOSE, CAPILLARY Status: None    Collection Time    08/27/14 10:04 PM   Result  Value  Ref Range    Glucose-Capillary  76  70 - 99 mg/dL    Comment 1  Notify RN    GLUCOSE, CAPILLARY Status: None    Collection Time    08/27/14 10:50 PM   Result  Value  Ref Range    Glucose-Capillary  98  70 - 99 mg/dL   GLUCOSE, CAPILLARY Status: Abnormal    Collection Time    08/28/14 7:01 AM   Result  Value  Ref Range    Glucose-Capillary  107 (*)  70 - 99 mg/dL   GLUCOSE, CAPILLARY Status: Abnormal    Collection Time    08/28/14 11:59 AM   Result  Value  Ref Range    Glucose-Capillary  164 (*)  70 - 99 mg/dL    Comment 1  Notify RN     No results found.  Medical Problem List and Plan:  1. Functional deficits secondary to lumbar stenosis with neurogenic claudication  2. DVT Prophylaxis/Anticoagulation: Pharmaceutical: Lovenox  3. Pain Management: Continue neurontin for neuropathy and prn oxycodone for pain management. Flexeril effective for spasms.  4. Mood: Mood stable and appears brighter today. LCSW to follow for evaluation and support.  5. Neuropsych: This patient is capable of making decisions on her own behalf.  6. Skin/Wound Care: Pressure relief measures--turn 2-3 hours at night to help with pressure and  pain relief. . Monitor hydration and nutritional status additionally.  7. Fluids/Electrolytes/Nutrition: Monitor I/O. Check labs in am.  8. DM type 2 with diabetic neuropathy: Monitor blood sugars ac/hs. Patient has had low BS and has refused lantus for past 24 hours--encourage use of lantus 25 units am and 40 units in pm. Off Victoza at this time. Will decrease meal coverage to prevent hypoglycemic episodes.  9. HTN: Monitor BP every 8 hours. Continue lisinopril, coreg and Norvasc.  10 Non-obstructive CAD: Resume ASA. Continue Coreg, Lipitor and Lisinopril  11. OSA: Continue CPAP with oxygen bled in.  12. Pulmonary HTN/ Asthma: Will encourage IS. Advair bid with duonebs prn SOB/wheezing.  13. ABLA: Improved past transfusion. Will check follow up labs in am.  14. . Hypokalemia: Likely dilutional from IVF. Will discontinue and encourage po intake. Check labs in am.  15. Dysuria: She reports that she completed doxycyline PTA. Will discontinue and check UA/UCS due to complaints of dysuria.  Post Admission Physician Evaluation:  Functional deficits secondary to lumbar stenosis with neurogenic claudication  1. Patient is admitted to receive collaborative, interdisciplinary care between the physiatrist, rehab nursing staff, and therapy team. 2. Patient's level of medical complexity and substantial therapy needs in context of that medical necessity cannot be provided at a lesser intensity of care such as a SNF. 3. Patient has experienced substantial functional loss from his/her baseline which was documented above under the "Functional History" and "Functional Status" headings. Judging by the patient's diagnosis, physical exam, and functional history,  the patient has potential for functional progress which will result in measurable gains while on inpatient rehab. These gains will be of substantial and practical use upon discharge in facilitating mobility and self-care at the household level. 4. Physiatrist  will provide 24 hour management of medical needs as well as oversight of the therapy plan/treatment and provide guidance as appropriate regarding the interaction of the two. 5. 24 hour rehab nursing will assist with bladder management, bowel management, safety, skin/wound care, disease management, medication administration, pain management and patient education and help integrate therapy concepts, techniques,education, etc. 6. PT will assess and treat for/with: Lower extremity strength, range of motion, stamina, balance, functional mobility, safety, adaptive techniques and equipment, NMR, back precautions, pain mgt. Goals are: supervision to mod I. 7. OT will assess and treat for/with: ADL's, functional mobility, safety, upper extremity strength, adaptive techniques and equipment, back precautions, pain mgt. Goals are: mod I to set up. Therapy may not yet proceed with showering this patient. 8. SLP will assess and treat for/with: n/a. Goals are: n/a. 9. Case Management and Social Worker will assess and treat for psychological issues and discharge planning. 10. Team conference will be held weekly to assess progress toward goals and to determine barriers to discharge. 11. Patient will receive at least 3 hours of therapy per day at least 5 days per week. 12. ELOS: 11-14 days  13. Prognosis: excellent  Meredith Staggers, MD, [place] [facility] 08/28/2014

## 2014-08-29 ENCOUNTER — Inpatient Hospital Stay (HOSPITAL_COMMUNITY): Payer: Medicare Other | Admitting: Occupational Therapy

## 2014-08-29 ENCOUNTER — Inpatient Hospital Stay (HOSPITAL_COMMUNITY): Payer: Medicare Other | Admitting: Rehabilitation

## 2014-08-29 DIAGNOSIS — G8310 Monoplegia of lower limb affecting unspecified side: Secondary | ICD-10-CM

## 2014-08-29 DIAGNOSIS — M4806 Spinal stenosis, lumbar region: Secondary | ICD-10-CM

## 2014-08-29 DIAGNOSIS — I10 Essential (primary) hypertension: Secondary | ICD-10-CM

## 2014-08-29 DIAGNOSIS — M792 Neuralgia and neuritis, unspecified: Secondary | ICD-10-CM

## 2014-08-29 LAB — COMPREHENSIVE METABOLIC PANEL
ALBUMIN: 2.7 g/dL — AB (ref 3.5–5.2)
ALT: 32 U/L (ref 0–35)
AST: 65 U/L — AB (ref 0–37)
Alkaline Phosphatase: 156 U/L — ABNORMAL HIGH (ref 39–117)
Anion gap: 7 (ref 5–15)
BUN: 9 mg/dL (ref 6–23)
CALCIUM: 8.8 mg/dL (ref 8.4–10.5)
CO2: 30 meq/L (ref 19–32)
Chloride: 103 mEq/L (ref 96–112)
Creatinine, Ser: 0.72 mg/dL (ref 0.50–1.10)
GFR calc Af Amer: >90 mL/min (ref >90)
Glucose, Bld: 159 mg/dL — ABNORMAL HIGH (ref 70–99)
Potassium: 4.4 mEq/L (ref 3.7–5.3)
Sodium: 140 mEq/L (ref 137–147)
Total Bilirubin: 0.9 mg/dL (ref 0.3–1.2)
Total Protein: 6.3 g/dL (ref 6.0–8.3)

## 2014-08-29 LAB — CBC WITH DIFFERENTIAL/PLATELET
BASOS ABS: 0 10*3/uL (ref 0.0–0.1)
BASOS PCT: 0 % (ref 0–1)
EOS PCT: 8 % — AB (ref 0–5)
Eosinophils Absolute: 0.4 10*3/uL (ref 0.0–0.7)
HCT: 28.6 % — ABNORMAL LOW (ref 36.0–46.0)
Hemoglobin: 9.7 g/dL — ABNORMAL LOW (ref 12.0–15.0)
Lymphocytes Relative: 38 % (ref 12–46)
Lymphs Abs: 1.8 10*3/uL (ref 0.7–4.0)
MCH: 32.2 pg (ref 26.0–34.0)
MCHC: 33.9 g/dL (ref 30.0–36.0)
MCV: 95 fL (ref 78.0–100.0)
Monocytes Absolute: 0.4 10*3/uL (ref 0.1–1.0)
Monocytes Relative: 9 % (ref 3–12)
NEUTROS ABS: 2.1 10*3/uL (ref 1.7–7.7)
Neutrophils Relative %: 45 % (ref 43–77)
PLATELETS: 158 10*3/uL (ref 150–400)
RBC: 3.01 MIL/uL — ABNORMAL LOW (ref 3.87–5.11)
RDW: 15 % (ref 11.5–15.5)
WBC: 4.7 10*3/uL (ref 4.0–10.5)

## 2014-08-29 LAB — GLUCOSE, CAPILLARY
GLUCOSE-CAPILLARY: 150 mg/dL — AB (ref 70–99)
GLUCOSE-CAPILLARY: 162 mg/dL — AB (ref 70–99)
GLUCOSE-CAPILLARY: 161 mg/dL — AB (ref 70–99)
Glucose-Capillary: 156 mg/dL — ABNORMAL HIGH (ref 70–99)

## 2014-08-29 LAB — URINE CULTURE
COLONY COUNT: NO GROWTH
CULTURE: NO GROWTH

## 2014-08-29 MED ORDER — GLUCERNA SHAKE PO LIQD
237.0000 mL | Freq: Every day | ORAL | Status: DC
  Administered 2014-08-29 – 2014-09-06 (×4): 237 mL via ORAL

## 2014-08-29 MED ORDER — TRAMADOL HCL 50 MG PO TABS
50.0000 mg | ORAL_TABLET | Freq: Four times a day (QID) | ORAL | Status: DC | PRN
  Administered 2014-08-29 – 2014-09-07 (×29): 50 mg via ORAL
  Filled 2014-08-29 (×29): qty 1

## 2014-08-29 NOTE — Progress Notes (Signed)
Occupational Therapy Assessment and Plan  Patient Details  Name: Kimberly [name] MRN: [id number] Date of Birth: 1956-07-21  OT Diagnosis: abnormal posture, acute pain and muscle weakness (generalized) Rehab Potential: Rehab Potential: Good (for stated goals) ELOS: 14 days   Today's Date: 08/29/2014 OT Individual Time: 6387- 5643 and 1325-1415 OT Individual Time Calculation (min): 70 min and 50 min     Problem List:  Patient Active Problem List   Diagnosis Date Noted  . Lumbar stenosis with neurogenic claudication 08/28/2014  . Lumbosacral spondylosis without myelopathy 08/23/2014  . Lumbosacral stenosis with neurogenic claudication 08/23/2014  . Vaginal discharge 06/22/2014  . Chest pain 06/21/2014  . Spinal stenosis 08/23/2011  . Obstructive sleep apnea 08/23/2011  . IDDM 08/12/2010  . CAD 08/12/2010  . CHRONIC DIASTOLIC HEART FAILURE 32/95/1884  . HYPERLIPIDEMIA-MIXED 06/23/2009  . OVERWEIGHT/OBESITY 06/23/2009  . HYPERTENSION, UNSPECIFIED 06/23/2009  . CHEST PAIN-UNSPECIFIED 06/23/2009    Past Medical History:  Past Medical History  Diagnosis Date  . CAD (coronary artery disease)     nonobstructive by cath, 6/08 (false positive Cardiolite) normal stress echo, 7/11  . Heart failure, diastolic, chronic   . GERD (gastroesophageal reflux disease)   . Morbid obesity   . Obstructive sleep apnea   . IDDM (insulin dependent diabetes mellitus)   . HTN (hypertension)   . Shortness of breath   . Asthma   . Spinal stenosis   . Chronic back pain   . Arthritis   . Degenerative joint disease   . Glaucoma   . CHF (congestive heart failure)   . Complication of anesthesia     had an asthma attack when woke up from procedure  . Stroke     12/09/2013  . Pneumonia     as a child  . Hypothyroidism   . Depression   . Hernia of abdominal cavity     "upper and lower hernia"  . Seizures     PMH: only as a child  . Heart murmur     PMH:As a child only  . Headache(784.0)   .  Neuropathy     associated with diabetes  . Anemia     PMH: as a child  . Fatty liver   . Pancreatitis   . IBS (irritable bowel syndrome)   . Rheumatic fever     PMH: as a child   Past Surgical History:  Past Surgical History  Procedure Laterality Date  . Eye surgery  1967  . Tubal ligation  1975  . Partial hysterectomy  1981  . Hemorrhoid surgery    . Hernia repair    . Spinahatomy  1992  . Cholecystectomy  1994  . Ovaries removed  2003  . Heel (other)  2005  . Abdominal hysterectomy    . Bilateral knee arthroscopy  2000, 2009  . Colonoscopy N/A 08/01/2013    Procedure: COLONOSCOPY;  Surgeon: Rogene Houston, MD;  Location: [facility];  Service: Endoscopy;  Laterality: N/A;  930  . Cataract extraction w/ intraocular lens  implant, bilateral    . Back surgery    . Appendectomy    . Cyst excision      Left breast  . Cardiac catheterization      2009  . Lumbar laminectomy/decompression microdiscectomy Right 08/23/2014    Procedure: LUMBAR LAMINECTOMY/DECOMPRESSION MICRODISCECTOMY 1 LEVEL;  Surgeon: Charlie Pitter, MD;  Location: [facility] [facility];  Service: Neurosurgery;  Laterality: Right;  LUMBAR LAMINECTOMY/DECOMPRESSION MICRODISCECTOMY 1 LEVEL LUMBAR 2-3  Assessment & Plan Clinical Impression: Patient is a 58 y.o. year old female female with history of HTN, DM type 2 with neuropathy, CVA 1/15, severe back and LE pain due to L2/3 spondylosis with severe stenosis and radiculopathy. Patient elected to undergo L2-3 decompressive lam with repair of complex dural laceration on 08/23/14 by Dr. Annette Stable. Post op on bedrest till 10/05 and patient with reports of RLE numbness and heaviness. Patient continues to be limited by RLE instability requiring blocking to prevent buckling with transfer. Patient transferred to CIR on 08/28/2014 .    Patient currently requires max with basic self-care skills secondary to muscle weakness, decreased cardiorespiratoy endurance and decreased sitting balance,  decreased standing balance, decreased postural control and decreased balance strategies.  Prior to hospitalization, patient could complete ADLs and simple meal prep with modified independent .  Patient will benefit from skilled intervention to increase independence with basic self-care skills prior to discharge home with care partner.  Anticipate patient will require 24 hour supervision and follow up [place] vs outpatient OT services..      Skilled Therapeutic Intervention Session 1: Upon entering the room, pt supine with 8/10 pain. RN notifed and medications given. OT evaluation initiated and completed this session. Pt educated on OT purpose, POC, and goals with acceptance of POC. Pt performed transfer bed> drop arm commode with Mod A squat pivot. Toileting with total A secondary to pt requiring assistance for hygiene and clothing management. Patient began to wash self while seated on drop arm commode chair. No clothing available for this session so hospital gown donned. Transfer BSC> wheelchair with Mod A squat pivot to sit at sink for grooming tasks. Pt seated in wheelchair upon exiting the room awaiting next therapist. Call bell and all other needed items within reach.  Session 2: Pt seated in wheelchair upon entering the room with 7/10 c/o pain this session in lower back at surgical site. OT educated pt on pain management such as medications in timely manner and other activities that may alleviate the pain. OT also began education regarding energy conservation as pt fatigues easily and has low activity tolerance. Education to continue. Pt propelled wheelchair  ~ 350 feet to tub bench room with B UEs and 4 rest breaks secondary to increased fatigue. OT educated and demonstrated tub bench transfer with use of RW. Pt returned demonstration, dry simulation, with Mod A for balance. Transfer back to wheelchair with 1 step with RW resulting in R LE bucking and requiring total A to regain balance to sit on TTB.  OT discussed concerns with pt and squat pivot transferred pt back to wheelchair to propel back towards room. Therapist alerted NT and RN regarding concerns with RW transfer with non therapist. Pt seated in wheelchair with call bell all needed items within reach upon entering the room.    OT Evaluation Precautions/Restrictions  Precautions Precautions: Fall;Back Precaution Booklet Issued: Yes (comment) Precaution Comments: RLE buckling with WB Restrictions Weight Bearing Restrictions: No General Chart Reviewed: Yes Vital Signs Oxygen Therapy SpO2: 98 % O2 Device: None (Room air) Pain Pain Assessment Pain Assessment: 0-10 Pain Score: 8  Pain Type: Surgical pain Pain Location: Back Pain Orientation: Right Pain Descriptors / Indicators: Constant;Stabbing Pain Frequency: Constant Pain Onset: On-going Patients Stated Pain Goal: 5 Pain Intervention(s): RN made aware;Repositioned;Rest Multiple Pain Sites: No Home Living/Prior Functioning Home Living Available Help at Discharge: Family;Friend(s);Available 24 hours/day Type of Home: House Home Access: Stairs to enter [employer] of Steps: 3 (1 step in  from garage which is perferred route) Entrance Stairs-Rails: None Home Layout: One level Additional Comments: has shower chair, SPC, rollator, RW  Lives With: Spouse IADL History Homemaking Responsibilities: Yes Meal Prep Responsibility: Secondary (shared task with husband) Prior Function Level of Independence: Needs assistance with homemaking;Requires assistive device for independence;Needs assistance with ADLs  Able to Take Stairs?: Yes Driving: No Leisure: Hobbies-yes (Comment) (crafts, cross stitching) Comments: husband assisted pt with shower transfers, socks and shoes, and supervision for safety ADL   Vision/Perception  Vision- History Baseline Vision/History: Wears glasses Wears Glasses: Reading only Patient Visual Report: No change from baseline Vision-  Assessment Vision Assessment?: No apparent visual deficits  Cognition Overall Cognitive Status: History of cognitive impairments - at baseline (has memory issues from CVA) Arousal/Alertness: Awake/alert Orientation Level: Oriented X4 Sensation   Motor    Mobility     Trunk/Postural Assessment     Balance   Extremity/Trunk Assessment      FIM:  FIM - Grooming Grooming Steps: Wash, rinse, dry face;Wash, rinse, dry hands;Oral care, brush teeth, clean dentures;Brush, comb hair Grooming: 5: Set-up assist to obtain items FIM - Bathing Bathing Steps Patient Completed: Chest;Right Arm;Left Arm;Abdomen;Right upper leg;Left upper leg Bathing: 3: Mod-Patient completes 5-7 8f10 parts or 50-74% FIM - Upper Body Dressing/Undressing Upper body dressing/undressing: 0: Wears gown/pajamas-no public clothing FIM - Lower Body Dressing/Undressing Lower body dressing/undressing: 0: Wears gown/pajamas-no public clothing FIM - Toileting Toileting: 1: Total-Patient completed zero steps, helper did all 3 FIM - BControl and instrumentation engineerDevices: Bed rails;Arm rests Bed/Chair Transfer: 4: Supine > Sit: Min A (steadying Pt. > 75%/lift 1 leg);3: Bed > Chair or W/C: Mod A (lift or lower assist) FIM - TRadio producerDevices: Bedside commode Toilet Transfers: 3-To toilet/BSC: Mod A (lift or lower assist) FIM - Tub/Shower Transfers Tub/shower Transfers: 0-Activity did not occur or was simulated   Refer to Care Plan for Long Term Goals  Recommendations for other services: Neuropsych  Discharge Criteria: Patient will be discharged from OT if patient refuses treatment 3 consecutive times without medical reason, if treatment goals not met, if there is a change in medical status, if patient makes no progress towards goals or if patient is discharged from hospital.  The above assessment, treatment plan, treatment alternatives and goals were discussed and  mutually agreed upon: by patient  PPhineas Semen10/06/2014, 9:52 AM

## 2014-08-29 NOTE — Progress Notes (Signed)
Social Work Assessment and Plan Social Work Assessment and Plan  Patient Details  Name: Kimberly [name] MRN: [id number] Date of Birth: May 17, 1956  Today's Date: 08/29/2014  Problem List:  Patient Active Problem List   Diagnosis Date Noted  . Lumbar stenosis with neurogenic claudication 08/28/2014  . Lumbosacral spondylosis without myelopathy 08/23/2014  . Lumbosacral stenosis with neurogenic claudication 08/23/2014  . Vaginal discharge 06/22/2014  . Chest pain 06/21/2014  . Spinal stenosis 08/23/2011  . Obstructive sleep apnea 08/23/2011  . IDDM 08/12/2010  . CAD 08/12/2010  . CHRONIC DIASTOLIC HEART FAILURE 08/12/2010  . HYPERLIPIDEMIA-MIXED 06/23/2009  . OVERWEIGHT/OBESITY 06/23/2009  . HYPERTENSION, UNSPECIFIED 06/23/2009  . CHEST PAIN-UNSPECIFIED 06/23/2009   Past Medical History:  Past Medical History  Diagnosis Date  . CAD (coronary artery disease)     nonobstructive by cath, 6/08 (false positive Cardiolite) normal stress echo, 7/11  . Heart failure, diastolic, chronic   . GERD (gastroesophageal reflux disease)   . Morbid obesity   . Obstructive sleep apnea   . IDDM (insulin dependent diabetes mellitus)   . HTN (hypertension)   . Shortness of breath   . Asthma   . Spinal stenosis   . Chronic back pain   . Arthritis   . Degenerative joint disease   . Glaucoma   . CHF (congestive heart failure)   . Complication of anesthesia     had an asthma attack when woke up from procedure  . Stroke     12/09/2013  . Pneumonia     as a child  . Hypothyroidism   . Depression   . Hernia of abdominal cavity     "upper and lower hernia"  . Seizures     PMH: only as a child  . Heart murmur     PMH:As a child only  . Headache(784.0)   . Neuropathy     associated with diabetes  . Anemia     PMH: as a child  . Fatty liver   . Pancreatitis   . IBS (irritable bowel syndrome)   . Rheumatic fever     PMH: as a child   Past Surgical History:  Past Surgical History   Procedure Laterality Date  . Eye surgery  1967  . Tubal ligation  1975  . Partial hysterectomy  1981  . Hemorrhoid surgery    . Hernia repair    . Spinahatomy  1992  . Cholecystectomy  1994  . Ovaries removed  2003  . Heel (other)  2005  . Abdominal hysterectomy    . Bilateral knee arthroscopy  2000, 2009  . Colonoscopy N/A 08/01/2013    Procedure: COLONOSCOPY;  Surgeon: Malissa Hippo, MD;  Location: [facility];  Service: Endoscopy;  Laterality: N/A;  930  . Cataract extraction w/ intraocular lens  implant, bilateral    . Back surgery    . Appendectomy    . Cyst excision      Left breast  . Cardiac catheterization      2009  . Lumbar laminectomy/decompression microdiscectomy Right 08/23/2014    Procedure: LUMBAR LAMINECTOMY/DECOMPRESSION MICRODISCECTOMY 1 LEVEL;  Surgeon: Temple Pacini, MD;  Location: [facility];  Service: Neurosurgery;  Laterality: Right;  LUMBAR LAMINECTOMY/DECOMPRESSION MICRODISCECTOMY 1 LEVEL LUMBAR 2-3   Social History:  reports that she has never smoked. She has never used smokeless tobacco. She reports that she does not drink alcohol or use illicit drugs.  Family / Support Systems Marital Status: Married Patient Roles: Spouse;Parent  Spouse/Significant Other: Dorene SorrowJerry  [phone] Children: Jasmine Aweerrick Lilly-son  [phone] Other Supports: Laural BenesLynn Loman-friend  Anticipated Caregiver: husband and Larita FifeLynn Ability/Limitations of Caregiver: Husband works 7p-7a and Larita FifeLynn comes over during the day to assist  Caregiver Availability: Other (Comment) Larita Fife(Lynn to assist if necessary) Family Dynamics: Pt is very close with friend who will come over and assist, while husband working or sleeping.  She has a son who is involved also, her older son doesn't visit, she is unsure why.  Social History Preferred language: English Religion: Holiness Cultural Background: No issues Education: High School Read: Yes Write: Yes Employment Status: Disabled Nurse, adultLegal Hisotry/Current  Legal Issues: No issues Guardian/Conservator: None-according to MD pt is capable of making her own decisions while here   Abuse/Neglect Physical Abuse: Denies Verbal Abuse: Denies Sexual Abuse: Denies Exploitation of patient/patient's resources: Denies Self-Neglect: Denies  Emotional Status Pt's affect, behavior adn adjustment status: Pt is motivated to be here and wants to do well here, but is sore and has pain issues.  She realizes it will take time to heal and recover from this surgery.  She is aware to give herself some time and she will improve and make progress here. Recent Psychosocial Issues: Other health issues-right sided weakness from her past stroke. Pyschiatric History: No history deferred depression screen due to feels she si doing ok at this time, her first day here.  Will monitor her coping and intervene if needed.  She takes each day at a time and tries to stay positive. Substance Abuse History: No issues  Patient / Family Perceptions, Expectations & Goals Pt/Family understanding of illness & functional limitations: Pt is able to explain her surgery and treatment plan.  She is glad the surgery is over and is ready to progress and get back home.  She has back pain for a while now and is ready to recover and get her life back. Premorbid pt/family roles/activities: Wife, Mother, grandmother, retiree, Friend, etc Anticipated changes in roles/activities/participation: resume Pt/family expectations/goals: Pt states: " I want to be able to move around on my own."  Friend states; " I will help her and hope her pain is better from this surgery."  [employer] IncCommunity Resources Community Agencies: None Premorbid Home Care/DME Agencies: Other (Comment) (had in past) Transportation available at discharge: Husband and friend Resource referrals recommended: Support group (specify)  Discharge Planning Living Arrangements: Spouse/significant other Support Systems: Spouse/significant  other;Children;Friends/neighbors Type of Residence: Private residence Insurance Resources: Media plannerrivate Insurance (specify) Passenger transport manager(Blue medicare) Financial Resources: Family Support;SSD Financial Screen Referred: No Living Expenses: Lives with family Money Management: Patient;Spouse Does the patient have any problems obtaining your medications?: No Home Management: Both she has been limited due to her back pain Patient/Family Preliminary Plans: Retrun home with husband who works 7p-7a three to four days a week.  Her friend Larita FifeLynn will be there to assist with her needs while husband is working or sleeping.  She should do well here. Social Work Anticipated Follow Up Needs: HH/OP;Support Group  Clinical Impression Pleasant female who is in pain form her back surgery, but willing to push through it and participate in therapies.  Supportive husband and friend.  Await team's evalutations and formulate a safe discharge plan.  Lucy Chrisupree, Alese G 08/29/2014, 11:02 AM

## 2014-08-29 NOTE — Progress Notes (Addendum)
Subjective/Complaints: 58 y.o. 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 Patient elected to undergo L2-3 decompressive lam with repair of complex dural laceration on 08/23/14 by Dr. Annette Stable. Post op on bedrest till 10/05 and patient with reports of RLE numbness and heaviness. Patient continues to be limited by RLE instability requiring blocking to prevent buckling with transfer.    Discussed pain med, oxy and hyrodone only partially effective, doesn't like sedation, also with burning pain B thighs Objective: Vital Signs: Blood pressure 133/57, pulse 65, temperature 98.7 F (37.1 C), temperature source Oral, resp. rate 17, height 5' (1.524 m), weight 112.3 kg (247 lb 9.2 oz), SpO2 97.00%. No results found. Results for orders placed during the hospital encounter of 08/28/14 (from the past 72 hour(s))  URINALYSIS, ROUTINE W REFLEX MICROSCOPIC     Status: None   Collection Time    08/28/14 11:23 PM      Result Value Ref Range   Color, Urine YELLOW  YELLOW   APPearance CLEAR  CLEAR   Specific Gravity, Urine 1.007  1.005 - 1.030   pH 7.5  5.0 - 8.0   Glucose, UA NEGATIVE  NEGATIVE mg/dL   Hgb urine dipstick NEGATIVE  NEGATIVE   Bilirubin Urine NEGATIVE  NEGATIVE   Ketones, ur NEGATIVE  NEGATIVE mg/dL   Protein, ur NEGATIVE  NEGATIVE mg/dL   Urobilinogen, UA 1.0  0.0 - 1.0 mg/dL   Nitrite NEGATIVE  NEGATIVE   Leukocytes, UA NEGATIVE  NEGATIVE   Comment: MICROSCOPIC NOT DONE ON URINES WITH NEGATIVE PROTEIN, BLOOD, LEUKOCYTES, NITRITE, OR GLUCOSE <1000 mg/dL.  CBC WITH DIFFERENTIAL     Status: Abnormal   Collection Time    08/29/14  4:35 AM      Result Value Ref Range   WBC 4.7  4.0 - 10.5 K/uL   RBC 3.01 (*) 3.87 - 5.11 MIL/uL   Hemoglobin 9.7 (*) 12.0 - 15.0 g/dL   HCT 28.6 (*) 36.0 - 46.0 %   MCV 95.0  78.0 - 100.0 fL   MCH 32.2  26.0 - 34.0 pg   MCHC 33.9  30.0 - 36.0 g/dL    RDW 15.0  11.5 - 15.5 %   Platelets 158  150 - 400 K/uL   Neutrophils Relative % 45  43 - 77 %   Neutro Abs 2.1  1.7 - 7.7 K/uL   Lymphocytes Relative 38  12 - 46 %   Lymphs Abs 1.8  0.7 - 4.0 K/uL   Monocytes Relative 9  3 - 12 %   Monocytes Absolute 0.4  0.1 - 1.0 K/uL   Eosinophils Relative 8 (*) 0 - 5 %   Eosinophils Absolute 0.4  0.0 - 0.7 K/uL   Basophils Relative 0  0 - 1 %   Basophils Absolute 0.0  0.0 - 0.1 K/uL  COMPREHENSIVE METABOLIC PANEL     Status: Abnormal   Collection Time    08/29/14  4:35 AM      Result Value Ref Range   Sodium 140  137 - 147 mEq/L   Potassium 4.4  3.7 - 5.3 mEq/L   Chloride 103  96 - 112 mEq/L   CO2 30  19 - 32 mEq/L   Glucose, Bld 159 (*) 70 - 99 mg/dL   BUN 9  6 - 23 mg/dL   Creatinine, Ser 0.72  0.50 - 1.10 mg/dL  Calcium 8.8  8.4 - 10.5 mg/dL   Total Protein 6.3  6.0 - 8.3 g/dL   Albumin 2.7 (*) 3.5 - 5.2 g/dL   AST 65 (*) 0 - 37 U/L   ALT 32  0 - 35 U/L   Alkaline Phosphatase 156 (*) 39 - 117 U/L   Total Bilirubin 0.9  0.3 - 1.2 mg/dL   GFR calc non Af Amer >90  >90 mL/min   GFR calc Af Amer >90  >90 mL/min   Comment: (NOTE)     The eGFR has been calculated using the CKD EPI equation.     This calculation has not been validated in all clinical situations.     eGFR's persistently <90 mL/min signify possible Chronic Kidney     Disease.   Anion gap 7  5 - 15  GLUCOSE, CAPILLARY     Status: Abnormal   Collection Time    08/29/14  6:38 AM      Result Value Ref Range   Glucose-Capillary 150 (*) 70 - 99 mg/dL     HEENT: normal Cardio: RRR and no murmur Resp: CTA B/L and unlabored GI: BS positive and NT,ND Extremity:  Pulses positive and No Edema Skin:   Wound SEROSANG drainage lumbar incision without erythema Neuro: Alert/Oriented, Anxious and Abnormal Sensory decreased R> L ant thigh Musc/Skel:  Normal Gen NAD   Assessment/Plan: 1. Functional deficits secondary to Lumbar stenosis with neurogenic claudication, s/p  decompression and fusion which require 3+ hours per day of interdisciplinary therapy in a comprehensive inpatient rehab setting. Physiatrist is providing close team supervision and 24 hour management of active medical problems listed below. Physiatrist and rehab team continue to assess barriers to discharge/monitor patient progress toward functional and medical goals. FIM:                    Comprehension Comprehension Mode: Auditory Comprehension: 5-Follows basic conversation/direction: With no assist  Expression Expression Mode: Verbal Expression: 5-Expresses basic needs/ideas: With no assist  Social Interaction Social Interaction: 6-Interacts appropriately with others with medication or extra time (anti-anxiety, antidepressant).  Problem Solving Problem Solving: 6-Solves complex problems: With extra time  Memory Memory: 7-Complete Independence: No helper  Medical Problem List and Plan:  1. Functional deficits secondary to lumbar stenosis with neurogenic claudication  2. DVT Prophylaxis/Anticoagulation: Pharmaceutical: Lovenox  3. Pain Management: Continue neurontin for neuropathy and trial ultram for pain management. Flexeril effective for spasms.  4. Mood: Mood stable and appears brighter today. LCSW to follow for evaluation and support.  5. Neuropsych: This patient is capable of making decisions on her own behalf.  6. Skin/Wound Care: Pressure relief measures--turn 2-3 hours at night to help with pressure and pain relief. . Monitor hydration and nutritional status additionally.  7. Fluids/Electrolytes/Nutrition: Monitor I/O. Check labs in am.  8. DM type 2 with diabetic neuropathy: Monitor blood sugars ac/hs. Patient has had low BS and has refused lantus for past 24 hours--encourage use of lantus 25 units am and 40 units in pm. Off Victoza at this time. Will decrease meal coverage to prevent hypoglycemic episodes.  9. HTN: Monitor BP every 8 hours. Continue lisinopril,  coreg and Norvasc.  10 Non-obstructive CAD: Resume ASA. Continue Coreg, Lipitor and Lisinopril  11. OSA: Continue CPAP with oxygen bled in.  12. Pulmonary HTN/ Asthma: Will encourage IS. Advair bid with duonebs prn SOB/wheezing.  13. ABLA: Improved past transfusion. Will check follow up labs in am.  14. . Hypokalemia: Likely dilutional from  IVF. Will discontinue and encourage po intake. Check labs in am.  15. Dysuria: She reports that she completed doxycyline PTA. Will discontinue and check UA/UCS due to complaints of dysuria.    LOS (Days) 1 A FACE TO FACE EVALUATION WAS PERFORMED  Tra Wilemon E 08/29/2014, 7:57 AM

## 2014-08-29 NOTE — Evaluation (Signed)
Physical Therapy Assessment and Plan  Patient Details  Name: Kimberly [name] MRN: [id number] Date of Birth: 10/11/1956  PT Diagnosis: Abnormal posture, Difficulty walking, Impaired sensation, Low back pain, Muscle weakness and Pain in R hip Rehab Potential: Good ELOS: 14-16 days    Today's Date: 08/29/2014 PT Individual Time: 0930-1030 PT Individual Time Calculation (min): 60 min    Problem List:  Patient Active Problem List   Diagnosis Date Noted  . Lumbar stenosis with neurogenic claudication 08/28/2014  . Lumbosacral spondylosis without myelopathy 08/23/2014  . Lumbosacral stenosis with neurogenic claudication 08/23/2014  . Vaginal discharge 06/22/2014  . Chest pain 06/21/2014  . Spinal stenosis 08/23/2011  . Obstructive sleep apnea 08/23/2011  . IDDM 08/12/2010  . CAD 08/12/2010  . CHRONIC DIASTOLIC HEART FAILURE 78/41/2820  . HYPERLIPIDEMIA-MIXED 06/23/2009  . OVERWEIGHT/OBESITY 06/23/2009  . HYPERTENSION, UNSPECIFIED 06/23/2009  . CHEST PAIN-UNSPECIFIED 06/23/2009    Past Medical History:  Past Medical History  Diagnosis Date  . CAD (coronary artery disease)     nonobstructive by cath, 6/08 (false positive Cardiolite) normal stress echo, 7/11  . Heart failure, diastolic, chronic   . GERD (gastroesophageal reflux disease)   . Morbid obesity   . Obstructive sleep apnea   . IDDM (insulin dependent diabetes mellitus)   . HTN (hypertension)   . Shortness of breath   . Asthma   . Spinal stenosis   . Chronic back pain   . Arthritis   . Degenerative joint disease   . Glaucoma   . CHF (congestive heart failure)   . Complication of anesthesia     had an asthma attack when woke up from procedure  . Stroke     12/09/2013  . Pneumonia     as a child  . Hypothyroidism   . Depression   . Hernia of abdominal cavity     "upper and lower hernia"  . Seizures     PMH: only as a child  . Heart murmur     PMH:As a child only  . Headache(784.0)   . Neuropathy      associated with diabetes  . Anemia     PMH: as a child  . Fatty liver   . Pancreatitis   . IBS (irritable bowel syndrome)   . Rheumatic fever     PMH: as a child   Past Surgical History:  Past Surgical History  Procedure Laterality Date  . Eye surgery  1967  . Tubal ligation  1975  . Partial hysterectomy  1981  . Hemorrhoid surgery    . Hernia repair    . Spinahatomy  1992  . Cholecystectomy  1994  . Ovaries removed  2003  . Heel (other)  2005  . Abdominal hysterectomy    . Bilateral knee arthroscopy  2000, 2009  . Colonoscopy N/A 08/01/2013    Procedure: COLONOSCOPY;  Surgeon: Rogene Houston, MD;  Location: [facility];  Service: Endoscopy;  Laterality: N/A;  930  . Cataract extraction w/ intraocular lens  implant, bilateral    . Back surgery    . Appendectomy    . Cyst excision      Left breast  . Cardiac catheterization      2009  . Lumbar laminectomy/decompression microdiscectomy Right 08/23/2014    Procedure: LUMBAR LAMINECTOMY/DECOMPRESSION MICRODISCECTOMY 1 LEVEL;  Surgeon: Charlie Pitter, MD;  Location: [facility];  Service: Neurosurgery;  Laterality: Right;  LUMBAR LAMINECTOMY/DECOMPRESSION MICRODISCECTOMY 1 LEVEL LUMBAR 2-3    Assessment &  Plan Clinical Impression: Patient is a 58 y.o. year old female with history of HTN, DM type 2 with neuropathy, CVA 1/15, severe back and LE pain due to L2/3 spondylosis with severe stenosis and radiculopathy. Patient elected to undergo L2-3 decompressive lam with repair of complex dural laceration on 08/23/14 by Dr. Annette Stable. Post op on bedrest till 10/05 and patient with reports of RLE numbness and heaviness. OT evaluation done today and patient with RLE instability requiring blocking to prevent buckling with transfer. MD recommending CIR for progressive therapy.  Patient transferred to CIR on 08/28/2014 .   Patient currently requires mod with mobility (+2 for chair follow with gait) secondary to muscle weakness and decreased  cardiorespiratoy endurance.  Prior to hospitalization, patient was modified independent  with mobility (min A for getting into shower and putting on socks/shoes) and lived with Spouse in a House home.  Home access is 3 (1 step in from garage which is perferred route)Stairs to enter.  Patient will benefit from skilled PT intervention to maximize safe functional mobility, minimize fall risk and decrease caregiver burden for planned discharge home with 24 hour supervision.  Anticipate patient will benefit from follow up [place] at discharge.  PT - End of Session Activity Tolerance: Tolerates 30+ min activity with multiple rests Endurance Deficit: Yes PT Assessment Rehab Potential: Good Barriers to Discharge: Decreased caregiver support PT Patient demonstrates impairments in the following area(s): Balance;Endurance;Motor;Safety PT Transfers Functional Problem(s): Bed Mobility;Bed to Chair;Car;Furniture PT Locomotion Functional Problem(s): Ambulation;Wheelchair Mobility;Stairs PT Plan PT Intensity: Minimum of 1-2 x/day ,45 to 90 minutes PT Frequency: 5 out of 7 days PT Duration Estimated Length of Stay: 14-16 days  PT Treatment/Interventions: Training and development officer;Ambulation/gait training;Community reintegration;Discharge planning;Disease management/prevention;DME/adaptive equipment instruction;Functional mobility training;Neuromuscular re-education;Patient/family education;Psychosocial support;Splinting/orthotics;Stair training;Therapeutic Activities;Therapeutic Exercise;UE/LE Strength taining/ROM;Wheelchair propulsion/positioning;UE/LE Coordination activities PT Transfers Anticipated Outcome(s): S PT Locomotion Anticipated Outcome(s): S for short distances PT Recommendation Follow Up Recommendations: Home health PT;24 hour supervision/assistance Patient destination: Home Equipment Recommended: Wheelchair (measurements);Wheelchair cushion (measurements)  Skilled Therapeutic Intervention PT  assessment and evaluation completed, see details below.  Pt initially very anxious with all aspects of mobility, however did better with increased practice and once report established.  Initiated gait training x 7' with RW at mod A level (+2 for close chair follow).  PT at R knee to prevent buckle, however knee did not buckle during gait.  She does have increased pain and weakness in RLE, but she was able to advance RLE on her own today.  Did note decreased DF.  Performed stand pivot transfer with use of RW at mod A level with step by step cues for stepping sequence and at R knee to prevent buckle.  Pt did very well and discussed that she can perform squat pivot with nursing, which she did at min A level and will work on stand pivot during therapy.  Pt concerned about finding comfortable sleeping position. Therefore in ADL apt, went over placing two pillows beneath knees to relieve pressure from spine, also went over placing pillow between LEs.  Both of these worked well and this was passed onto Chartered certified accountant.  Discussed ELOS, expected outcomes and equipment needs.   Pt verbalized understanding.    PT Evaluation Precautions/Restrictions Precautions Precautions: Fall;Back Precaution Booklet Issued: Yes (comment) Precaution Comments: RLE buckling with WB Restrictions Weight Bearing Restrictions: No General Chart Reviewed: Yes Family/Caregiver Present: No Vital SignsOxygen Therapy SpO2: 98 % O2 Device: None (Room air) Pain Pain Assessment Pain Assessment: 0-10 Pain Score: 8  Pain Type: Surgical pain Pain Location: Back Pain Orientation: Right Pain Descriptors / Indicators: Constant;Stabbing Pain Onset: On-going Patients Stated Pain Goal: 5 Pain Intervention(s): RN made aware;Repositioned;Rest Multiple Pain Sites: No Home Living/Prior Functioning Home Living Living Arrangements: Spouse/significant other Available Help at Discharge: Family;Friend(s);Available 24 hours/day Type of Home:  House Home Access: Stairs to enter [employer] of Steps: 3 (1 step in from garage which is perferred route) Entrance Stairs-Rails: None Home Layout: One level Additional Comments: has shower chair, SPC, rollator, RW  Lives With: Spouse Prior Function Level of Independence: Needs assistance with homemaking;Requires assistive device for independence;Needs assistance with ADLs  Able to Take Stairs?: Yes Driving: No Leisure: Hobbies-yes (Comment) (crafts, cross stitching) Comments: husband assisted pt with shower transfers, socks and shoes, and supervision for safety Vision/Perception   No changes in vision, wears glasses for reading.  Cognition Overall Cognitive Status: History of cognitive impairments - at baseline (has memory issues from CVA) Arousal/Alertness: Awake/alert Orientation Level: Oriented X4 Attention: Selective;Alternating Selective Attention: Appears intact Alternating Attention: Appears intact Memory: Impaired Memory Impairment: Decreased recall of new information Awareness: Appears intact Problem Solving: Appears intact Safety/Judgment: Appears intact Comments: Pt aware that she needs to call for assist when getting up due to weakness and pain.  Sensation Sensation Light Touch: Impaired Detail Light Touch Impaired Details: Impaired RLE Stereognosis: Not tested Hot/Cold: Not tested Proprioception: Appears Intact Coordination Gross Motor Movements are Fluid and Coordinated: No Fine Motor Movements are Fluid and Coordinated: No Coordination and Movement Description: Pt with decreased coordination in RLE, however likely due to weakness and decreased sensation,  Motor  Motor Motor: Abnormal postural alignment and control;Other (comment) Motor - Skilled Clinical Observations: Pt with decreased strength in R>LLE, decreased activity tolerance, and decreased balance.  Difficulty maintaining upright posture.   Mobility Bed Mobility Bed Mobility: Rolling  Right;Rolling Left;Right Sidelying to Sit;Sit to Sidelying Right Rolling Right: 5: Supervision Rolling Right Details: Verbal cues for precautions/safety;Verbal cues for technique Rolling Left: 5: Supervision Rolling Left Details: Verbal cues for technique;Verbal cues for precautions/safety Right Sidelying to Sit: 4: Min assist Right Sidelying to Sit Details: Verbal cues for sequencing;Verbal cues for technique;Verbal cues for precautions/safety;Manual facilitation for placement Sit to Sidelying Right: 4: Min assist Sit to Sidelying Right Details: Verbal cues for sequencing;Verbal cues for technique;Verbal cues for precautions/safety;Manual facilitation for placement Transfers Transfers: Yes Sit to Stand: 4: Min assist;3: Mod assist Sit to Stand Details: Verbal cues for sequencing;Verbal cues for technique;Verbal cues for precautions/safety;Manual facilitation for weight shifting;Verbal cues for safe use of DME/AE Stand to Sit: 4: Min assist Stand to Sit Details (indicate cue type and reason): Verbal cues for sequencing;Verbal cues for technique;Verbal cues for precautions/safety;Manual facilitation for weight shifting Stand Pivot Transfers: 3: Mod assist Stand Pivot Transfer Details: Verbal cues for sequencing;Verbal cues for technique;Verbal cues for precautions/safety;Manual facilitation for weight shifting;Verbal cues for safe use of DME/AE;Verbal cues for gait pattern Squat Pivot Transfers: 4: Min assist;With armrests Squat Pivot Transfer Details: Verbal cues for sequencing;Verbal cues for technique;Verbal cues for precautions/safety Locomotion  Ambulation Ambulation: Yes Ambulation/Gait Assistance: 1: +2 Total assist;3: Mod assist (+2 for chair follow) Ambulation Distance (Feet): 7 Feet Assistive device: Rolling walker Ambulation/Gait Assistance Details: Verbal cues for sequencing;Verbal cues for technique;Verbal cues for precautions/safety;Verbal cues for gait pattern;Verbal cues for  safe use of DME/AE;Manual facilitation for weight shifting;Manual facilitation for placement Gait Gait: Yes Gait Pattern: Impaired Gait Pattern: Decreased dorsiflexion - right;Decreased hip/knee flexion - right;Decreased stance time - right;Decreased step length -  left;Step-to pattern;Trunk flexed;Narrow base of support Stairs / Additional Locomotion Stairs: No Architect: Yes Wheelchair Assistance: 4: Audiological scientist Details: Verbal cues for sequencing;Tactile cues for initiation;Tactile cues for sequencing;Verbal cues for technique;Verbal cues for precautions/safety Wheelchair Propulsion: Both upper extremities Wheelchair Parts Management: Needs assistance (due to back precautions) Distance: 60  Trunk/Postural Assessment  Cervical Assessment Cervical Assessment: Exceptions to [facility] (forward head) Thoracic Assessment Thoracic Assessment: Exceptions to [facility] (rounded shoulders) Lumbar Assessment Lumbar Assessment: Exceptions to [facility] (pt tends to sit and stand with posterior pelvic tilt) Postural Control Postural Control: Deficits on evaluation Postural Limitations: Pt unable to obtain or maintain full upright posture, mostly due to pain during eval.   Balance Balance Balance Assessed: Yes Static Sitting Balance Static Sitting - Balance Support: Feet supported Static Sitting - Level of Assistance: 5: Stand by assistance Dynamic Sitting Balance Dynamic Sitting - Balance Support: Feet supported;During functional activity Dynamic Sitting - Level of Assistance: 5: Stand by assistance Static Standing Balance Static Standing - Balance Support: Bilateral upper extremity supported Static Standing - Level of Assistance: 4: Min assist Dynamic Standing Balance Dynamic Standing - Balance Support: During functional activity;Bilateral upper extremity supported Dynamic Standing - Level of Assistance: 3: Mod assist Extremity Assessment      RLE  Assessment RLE Assessment: Exceptions to [facility] RLE Strength Right Hip Flexion: 2/5 Right Hip Extension: 2+/5 Right Knee Flexion: 3/5 Right Knee Extension: 3-/5 Right Ankle Dorsiflexion: 2-/5 Right Ankle Plantar Flexion: 2+/5 LLE Assessment LLE Assessment: Within Functional Limits  FIM:  FIM - Bed/Chair Transfer Bed/Chair Transfer Assistive Devices: Bed rails;Arm rests Bed/Chair Transfer: 4: Supine > Sit: Min A (steadying Pt. > 75%/lift 1 leg);4: Sit > Supine: Min A (steadying pt. > 75%/lift 1 leg);4: Bed > Chair or W/C: Min A (steadying Pt. > 75%);4: Chair or W/C > Bed: Min A (steadying Pt. > 75%) FIM - Locomotion: Wheelchair Distance: 60 Locomotion: Wheelchair: 2: Travels 50 - 149 ft with minimal assistance (Pt.>75%) FIM - Locomotion: Ambulation Locomotion: Ambulation Assistive Devices: Administrator Ambulation/Gait Assistance: 1: +2 Total assist;3: Mod assist (+2 for chair follow) Locomotion: Ambulation: 1: Two helpers FIM - Locomotion: Stairs Locomotion: Stairs: 0: Activity did not occur (not safe at this time)   Refer to Care Plan for Long Term Goals  Recommendations for other services: None  Discharge Criteria: Patient will be discharged from PT if patient refuses treatment 3 consecutive times without medical reason, if treatment goals not met, if there is a change in medical status, if patient makes no progress towards goals or if patient is discharged from hospital.  The above assessment, treatment plan, treatment alternatives and goals were discussed and mutually agreed upon: by patient  Denice Bors 08/29/2014, 12:06 PM

## 2014-08-29 NOTE — Progress Notes (Signed)
Patient information reviewed and entered into eRehab system by Aloma Boch, RN, CRRN, PPS Coordinator.  Information including medical coding and functional independence measure will be reviewed and updated through discharge.     Per nursing patient was given "Data Collection Information Summary for Patients in Inpatient Rehabilitation Facilities with attached "Privacy Act Statement-Health Care Records" upon admission.  

## 2014-08-29 NOTE — Progress Notes (Addendum)
INITIAL NUTRITION ASSESSMENT  DOCUMENTATION CODES Per approved criteria  -Morbid Obesity   INTERVENTION: Provide Glucerna Shake po once daily, each supplement provides 220 kcal and 10 grams of protein.  NUTRITION DIAGNOSIS: Increased nutrient needs related to illness as evidenced by estimated nutrition needs.   Goal: Pt to meet >/= 90% of their estimated nutrition needs   Monitor:  PO intake, weight trends, labs, I/O's  Reason for Assessment: MST  58 y.o. 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 Patient elected to undergo L2-3 decompressive lam with repair of complex dural laceration on 08/23/14.  Meal completion is 75-100%. Pt reports her appetite is good, however reports she has difficulties swallowing food at times. Pt however would not like her diet altered or changed. Pt reports no recent weight loss. Pt no other difficulties or problems. Pt reports she would like Glucerna ordered as she drinks them regularly at home. Will order.  Pt with no significant observed fat or muscle mass loss.  Labs: High AST and alkaline phosphatase.   Height: Ht Readings from Last 1 Encounters:  08/28/14 5' (1.524 m)    Weight: Wt Readings from Last 1 Encounters:  08/28/14 247 lb 9.2 oz (112.3 kg)    Ideal Body Weight: 100 lbs  % Ideal Body Weight: 247%  Wt Readings from Last 10 Encounters:  08/28/14 247 lb 9.2 oz (112.3 kg)  08/23/14 244 lb (110.678 kg)  08/23/14 244 lb (110.678 kg)  08/14/14 244 lb 14.4 oz (111.086 kg)  06/28/14 240 lb (108.863 kg)  06/21/14 254 lb (115.214 kg)  08/01/13 247 lb (112.038 kg)  08/01/13 247 lb (112.038 kg)  08/20/11 242 lb (109.77 kg)  10/20/10 246 lb (111.585 kg)    Usual Body Weight: 247 lbs  % Usual Body Weight: 100%  BMI:  Body mass index is 48.35  kg/(m^2). Morbid obesity  Estimated Nutritional Needs: Kcal: 2000-2200 Protein: 100-115 grams Fluid: 2- 2.2 L/day  Skin: incision on back, +1 LE edema  Diet Order: Carb Control  EDUCATION NEEDS: -No education needs identified at this time   Intake/Output Summary (Last 24 hours) at 08/29/14 1008 Last data filed at 08/29/14 0800  Gross per 24 hour  Intake    243 ml  Output      0 ml  Net    243 ml    Last BM: 10/7  Labs:   Recent Labs Lab 08/23/14 1020 08/23/14 1056 08/29/14 0435  NA 139 139 140  K 3.4* 3.5* 4.4  CL  --   --  103  CO2  --   --  30  BUN  --   --  9  CREATININE  --   --  0.72  CALCIUM  --   --  8.8  GLUCOSE 113* 117* 159*    CBG (last 3)   Recent Labs  08/28/14 1159 08/28/14 1701 08/29/14 0638  GLUCAP 164* 144* 150*    Scheduled Meds: . amLODipine  2.5 mg Oral Daily  . aspirin EC  81 mg Oral Daily  . atorvastatin  10 mg Oral Daily  . carvedilol  12.5 mg Oral BID WC  . conjugated estrogens  1 Applicatorful Vaginal QHS  . enoxaparin (LOVENOX) injection  40 mg Subcutaneous Q24H  . famotidine  40 mg Oral Daily  . gabapentin  100 mg  Oral QPM  . insulin aspart  0-5 Units Subcutaneous QHS  . insulin aspart  0-9 Units Subcutaneous TID WC  . insulin aspart  15 Units Subcutaneous Q lunch   And  . insulin aspart  25 Units Subcutaneous Q supper  . insulin glargine  25 Units Subcutaneous Daily  . insulin glargine  40 Units Subcutaneous QHS  . ketotifen  2 drop Both Eyes BID  . levothyroxine  75 mcg Oral QAC breakfast  . lisinopril  10 mg Oral Daily  . mometasone-formoterol  2 puff Inhalation BID  . rOPINIRole  1 mg Oral QHS  . senna  1 tablet Oral BID  . sodium chloride  3 mL Intravenous Q12H    Continuous Infusions:   Past Medical History  Diagnosis Date  . CAD (coronary artery disease)     nonobstructive by cath, 6/08 (false positive Cardiolite) normal stress echo, 7/11  . Heart failure, diastolic, chronic   . GERD  (gastroesophageal reflux disease)   . Morbid obesity   . Obstructive sleep apnea   . IDDM (insulin dependent diabetes mellitus)   . HTN (hypertension)   . Shortness of breath   . Asthma   . Spinal stenosis   . Chronic back pain   . Arthritis   . Degenerative joint disease   . Glaucoma   . CHF (congestive heart failure)   . Complication of anesthesia     had an asthma attack when woke up from procedure  . Stroke     12/09/2013  . Pneumonia     as a child  . Hypothyroidism   . Depression   . Hernia of abdominal cavity     "upper and lower hernia"  . Seizures     PMH: only as a child  . Heart murmur     PMH:As a child only  . Headache(784.0)   . Neuropathy     associated with diabetes  . Anemia     PMH: as a child  . Fatty liver   . Pancreatitis   . IBS (irritable bowel syndrome)   . Rheumatic fever     PMH: as a child    Past Surgical History  Procedure Laterality Date  . Eye surgery  1967  . Tubal ligation  1975  . Partial hysterectomy  1981  . Hemorrhoid surgery    . Hernia repair    . Spinahatomy  1992  . Cholecystectomy  1994  . Ovaries removed  2003  . Heel (other)  2005  . Abdominal hysterectomy    . Bilateral knee arthroscopy  2000, 2009  . Colonoscopy N/A 08/01/2013    Procedure: COLONOSCOPY;  Surgeon: Malissa Hippo, MD;  Location: [facility];  Service: Endoscopy;  Laterality: N/A;  930  . Cataract extraction w/ intraocular lens  implant, bilateral    . Back surgery    . Appendectomy    . Cyst excision      Left breast  . Cardiac catheterization      2009  . Lumbar laminectomy/decompression microdiscectomy Right 08/23/2014    Procedure: LUMBAR LAMINECTOMY/DECOMPRESSION MICRODISCECTOMY 1 LEVEL;  Surgeon: Temple Pacini, MD;  Location: [facility];  Service: Neurosurgery;  Laterality: Right;  LUMBAR LAMINECTOMY/DECOMPRESSION MICRODISCECTOMY 1 LEVEL LUMBAR 2-3    Marijean Niemann, MS, RD, Provisional LDN Pager # [phone] After hours/ weekend pager #  [phone]

## 2014-08-29 NOTE — Care Management Note (Signed)
Inpatient Rehabilitation Center Individual Statement of Services  Patient Name:  Kimberly [name]  Date:  08/29/2014  Welcome to the Inpatient Rehabilitation Center.  Our goal is to provide you with an individualized program based on your diagnosis and situation, designed to meet your specific needs.  With this comprehensive rehabilitation program, you will be expected to participate in at least 3 hours of rehabilitation therapies Monday-Friday, with modified therapy programming on the weekends.  Your rehabilitation program will include the following services:  Physical Therapy (PT), Occupational Therapy (OT), 24 hour per day rehabilitation nursing, Therapeutic Recreaction (TR), Case Management (Social Worker), Rehabilitation Medicine, Nutrition Services and Pharmacy Services  Weekly team conferences will be held on Wednesday to discuss your progress.  Your Social Worker will talk with you frequently to get your input and to update you on team discussions.  Team conferences with you and your family in attendance may also be held.  Expected length of stay: 14-16 days  Overall anticipated outcome: supervision with cueing  Depending on your progress and recovery, your program may change. Your Social Worker will coordinate services and will keep you informed of any changes. Your Social Worker's name and contact numbers are listed  below.  The following services may also be recommended but are not provided by the Inpatient Rehabilitation Center:   Driving Evaluations  Home Health Rehabiltiation Services  Outpatient Rehabilitation Services    Arrangements will be made to provide these services after discharge if needed.  Arrangements include referral to agencies that provide these services.  Your insurance has been verified to be:  [employer] Your primary doctor is:  Sherril CroonVyas  Pertinent information will be shared with your doctor and your insurance company.  Social Worker:  Dossie DerBecky Monroe Qin, SW  [phone] or ([phone]) [phone]  Information discussed with and copy given to patient by: Lucy Chrisupree, Adalene G, 08/29/2014, 10:49 AM

## 2014-08-29 NOTE — Progress Notes (Signed)
[facility] Admission Coordinator Signed Physical Medicine and Rehabilitation PMR Pre-admission Service date: 08/28/2014 2:47 PM  Related encounter: Admission (Discharged) from 08/23/2014 in [facility]   PMR Admission Coordinator Pre-Admission Assessment  Patient: Kimberly [name] is an 58 y.o., female  MRN: [id number]  DOB: 1956/10/25  Height: 5' (152.4 cm)  Weight: 110.678 kg (244 lb)  Insurance Information  HMO: yes PPO: PCP: IPA: 80/20: OTHER:  PRIMARY: Blue Medicare Policy#: [id number] Subscriber: self  CM Name: Pieter Partridge, RN Phone#: [phone] Fax#: [phone]  Verbal approval given from Santiago Glad on 08-28-14 for seven days from 08-28-14 through 09-04-14 with updates due to Santiago Glad on 00-17-49  Pre-Cert#: Employer: disabled  Benefits: Phone #: [phone] Name: Jamar  Eff. Date: 11-22-13 Deduct: none Out of Pocket Max: $4500 (met $2346.61) Life Max: none  CIR: $250/day for days 1-6, pre-auth needed SNF: $0/day for days 1-20, $60/day for days 21-100 (100 day visit limit, pre-auth needed)  Outpatient: 100% Co-Pay: $35 copay, no visit limits, pre-auth needed for SLP only  Home Health: 100%, pre-auth needed Co-Pay: no copay, no visit limits  DME: 80% Co-Pay: 20% (no auth needed for equipment < $600)  Providers: in network   Emergency Contact Information    Contact Information     Name  Relation  Home  Work  Mobile     [place]  Spouse  [place]  Son    [phone]        Current Medical History  Patient Admitting Diagnosis: L2-3 stenosis with radiculopathy  History of Present Illness: Kimberly [name] is a 58 y.o. 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 Patient elected to undergo L2-3 decompressive lam with repair of complex dural laceration on 08/23/14 by Dr. Annette Stable. Post op on bedrest till  10/05 and patient with reports of RLE numbness and heaviness. OT evaluation done today and patient with RLE instability requiring blocking to prevent buckling with transfer. MD recommending CIR for progressive therapy.  Past Medical History    Past Medical History    Diagnosis  Date    .  CAD (coronary artery disease)       nonobstructive by cath, 6/08 (false positive Cardiolite) normal stress echo, 7/11    .  Heart failure, diastolic, chronic     .  GERD (gastroesophageal reflux disease)     .  Morbid obesity     .  Obstructive sleep apnea     .  IDDM (insulin dependent diabetes mellitus)     .  HTN (hypertension)     .  Shortness of breath     .  Asthma     .  Spinal stenosis     .  Chronic back pain     .  Arthritis     .  Degenerative joint disease     .  Glaucoma     .  CHF (congestive heart failure)     .  Complication of anesthesia       had an asthma attack when woke up from procedure    .  Stroke       12/09/2013    .  Pneumonia       as a child    .  Hypothyroidism     .  Depression     .  Hernia of abdominal cavity       "upper and lower hernia"    .  Seizures       PMH: only as a child    .  Heart murmur       PMH:As a child only    .  Headache(784.0)     .  Neuropathy       associated with diabetes    .  Anemia       PMH: as a child    .  Fatty liver     .  Pancreatitis     .  IBS (irritable bowel syndrome)     .  Rheumatic fever       PMH: as a child     Family History  family history includes Cancer in her other; Colon cancer in her brother; Heart failure in her other.  Prior Rehab/Hospitalizations: pt has had previous outpt therapy for neck and back issues.  Current Medications  Current facility-administered medications:0.9 % sodium chloride infusion, 250 mL, Intravenous, Continuous, Charlie Pitter, MD, Last Rate: 1 mL/hr at 08/23/14 1745, 250 mL at 08/23/14 1745; acetaminophen (TYLENOL) suppository 650 mg, 650 mg, Rectal, Q4H PRN, Charlie Pitter, MD;  acetaminophen (TYLENOL) tablet 650 mg, 650 mg, Oral, Q4H PRN, Charlie Pitter, MD  albuterol (PROVENTIL) (2.5 MG/3ML) 0.083% nebulizer solution 2.5 mg, 2.5 mg, Nebulization, BID, Charlie Pitter, MD, 2.5 mg at 08/28/14 6294; alum & mag hydroxide-simeth (MAALOX/MYLANTA) 200-200-20 MG/5ML suspension 30 mL, 30 mL, Oral, Q6H PRN, Charlie Pitter, MD; amLODipine (NORVASC) tablet 2.5 mg, 2.5 mg, Oral, Daily, Charlie Pitter, MD, 2.5 mg at 08/28/14 0816; aspirin EC tablet 81 mg, 81 mg, Oral, Daily, Charlie Pitter, MD, 81 mg at 08/28/14 0816  atorvastatin (LIPITOR) tablet 10 mg, 10 mg, Oral, Daily, Charlie Pitter, MD, 10 mg at 08/28/14 0816; bisacodyl (DULCOLAX) suppository 10 mg, 10 mg, Rectal, Daily PRN, Charlie Pitter, MD, 10 mg at 08/28/14 1053; carvedilol (COREG) tablet 12.5 mg, 12.5 mg, Oral, BID WC, Charlie Pitter, MD, 12.5 mg at 08/28/14 0816  conjugated estrogens (PREMARIN) vaginal cream 1 Applicatorful, 1 Applicatorful, Vaginal, QHS, Charlie Pitter, MD, 1 Applicatorful at 76/54/65 2137; cyclobenzaprine (FLEXERIL) tablet 10 mg, 10 mg, Oral, TID PRN, Charlie Pitter, MD, 10 mg at 08/28/14 1253; doxycycline (ADOXA) tablet 100 mg, 100 mg, Oral, BID, Charlie Pitter, MD, 100 mg at 08/28/14 0354; famotidine (PEPCID) tablet 40 mg, 40 mg, Oral, Daily, Charlie Pitter, MD, 40 mg at 08/28/14 0816  furosemide (LASIX) tablet 20-40 mg, 20-40 mg, Oral, Daily PRN, Charlie Pitter, MD; gabapentin (NEURONTIN) capsule 100 mg, 100 mg, Oral, QPM, Charlie Pitter, MD, 100 mg at 08/27/14 1828; HYDROcodone-acetaminophen (NORCO/VICODIN) 5-325 MG per tablet 1-2 tablet, 1-2 tablet, Oral, Q4H PRN, Charlie Pitter, MD; HYDROmorphone (DILAUDID) injection 0.5-1 mg, 0.5-1 mg, Intravenous, Q2H PRN, Charlie Pitter, MD, 1 mg at 08/25/14 0759  insulin aspart (novoLOG) injection 0-20 Units, 0-20 Units, Subcutaneous, TID WC, Charlie Pitter, MD, 4 Units at 08/28/14 1252; insulin aspart (novoLOG) injection 15 Units, 15 Units, Subcutaneous, Q lunch, Charlie Pitter, MD, 15 Units at 08/28/14  1253; insulin aspart (novoLOG) injection 25 Units, 25 Units, Subcutaneous, Q supper, Charlie Pitter, MD, 25 Units at 08/27/14 1728  insulin glargine (LANTUS) injection 25 Units, 25 Units, Subcutaneous, Daily, Charlie Pitter, MD, 25 Units at 08/27/14 1036; insulin glargine (LANTUS) injection 40 Units, 40 Units, Subcutaneous, QHS, Cooper Render  Pool, MD, 40 Units at 08/26/14 2129; ipratropium-albuterol (DUONEB) 0.5-2.5 (3) MG/3ML nebulizer solution 3 mL, 3 mL, Nebulization, Daily PRN, Charlie Pitter, MD  ketotifen (ZADITOR) 0.025 % ophthalmic solution 2 drop, 2 drop, Both Eyes, BID, Charlie Pitter, MD, 2 drop at 08/28/14 7564; levothyroxine (SYNTHROID, LEVOTHROID) tablet 75 mcg, 75 mcg, Oral, QAC breakfast, Charlie Pitter, MD, 75 mcg at 08/28/14 0815; lisinopril (PRINIVIL,ZESTRIL) tablet 10 mg, 10 mg, Oral, Daily, Charlie Pitter, MD, 10 mg at 08/28/14 0816; menthol-cetylpyridinium (CEPACOL) lozenge 3 mg, 1 lozenge, Oral, PRN, Charlie Pitter, MD  mometasone-formoterol [facility]) 200-5 MCG/ACT inhaler 2 puff, 2 puff, Inhalation, BID, Charlie Pitter, MD, 2 puff at 08/28/14 3329; ondansetron [facility]) injection 4 mg, 4 mg, Intravenous, Q4H PRN, Charlie Pitter, MD, 4 mg at 08/23/14 2133; oxyCODONE-acetaminophen (PERCOCET/ROXICET) 5-325 MG per tablet 1-2 tablet, 1-2 tablet, Oral, Q4H PRN, Charlie Pitter, MD, 2 tablet at 08/28/14 1253  phenol (CHLORASEPTIC) mouth spray 1 spray, 1 spray, Mouth/Throat, PRN, Charlie Pitter, MD; polyethylene glycol (MIRALAX / GLYCOLAX) packet 17 g, 17 g, Oral, Daily PRN, Charlie Pitter, MD, 17 g at 08/27/14 2132; rOPINIRole (REQUIP) tablet 1 mg, 1 mg, Oral, QHS, Charlie Pitter, MD, 1 mg at 08/27/14 2130; senna (SENOKOT) tablet 8.6 mg, 1 tablet, Oral, BID, Charlie Pitter, MD, 8.6 mg at 08/28/14 0816  sodium chloride 0.9 % injection 3 mL, 3 mL, Intravenous, Q12H, Charlie Pitter, MD, 3 mL at 08/28/14 0817; sodium chloride 0.9 % injection 3 mL, 3 mL, Intravenous, PRN, Charlie Pitter, MD  Patients Current Diet: Carb Control   Precautions / Restrictions  Precautions  Precautions: Fall;Back  Precaution Booklet Issued: Yes (comment)  Precaution Comments: RLE buckling with WB  Restrictions  Weight Bearing Restrictions: No  Prior Activity Level  Limited Community (1-2x/wk): Pt does not do the driving but got out with her husband 1-2 x/week on errands or to go out to eat. Pt used rolling walker at home and uses cane in community. She enjoys doing crafts like cross stitch and needlepoint. Pt has been on disability since 2010 for back issues.  Home Assistive Devices / Equipment  Home Assistive Devices/Equipment: CPAP;Eyeglasses;Walker (specify type);Shower chair with back  Home Equipment: [place] - 2 wheels;Cane - single point;Shower seat;Hand held shower head  Prior Functional Level  Prior Function  Level of Independence: Independent with assistive device(s)  Comments: reports using RW in the house and cane at the grocery store  Current Functional Level    Cognition  Overall Cognitive Status: Within Functional Limits for tasks assessed  Orientation Level: Oriented X4    Extremity Assessment  (includes Sensation/Coordination)      ADLs  Overall ADL's : Needs assistance/impaired  Eating/Feeding: Independent;Bed level  Grooming: Oral care;Wash/dry face;Sitting;Modified independent  Grooming Details (indicate cue type and reason): completed oral care in chair  Upper Body Bathing: Minimal assitance;Sitting  Lower Body Bathing: Total assistance;Sit to/from stand  Toilet Transfer: +2 for physical assistance;+2 for safety/equipment;Minimal assistance (with 3rd person tactile input for knee extension, quad )  Toilet Transfer Details (indicate cue type and reason): pt requires (A) To block R LE to prevent buckling  Functional mobility during ADLs: +2 for physical assistance;Minimal assistance;Rolling walker  General ADL Comments: Pt at eob with PT Becca on arrival. pt transferring to [facility] with total +3 (A) due to RLE  buckle. Third person providing tactile input for quad tightening to prevent buckle and two staff members helping with UB during transfer.  Pt needs cues for hand placement and safety. Pt is total (A) for peri care. Pt able to void easier on BSC compared to previous use of bed pan    Mobility  Overal bed mobility: Needs Assistance  Bed Mobility: Rolling;Sidelying to Sit  Rolling: Min guard  Sidelying to sit: Min guard  Supine to sit: Min assist  General bed mobility comments: on EOB on arrival    Transfers  Overall transfer level: Needs assistance  Equipment used: Rolling walker (2 wheeled)  Transfers: Sit to/from [facility]  Sit to Stand: +2 physical assistance;Min assist (third person RLE prevent buckle)  Stand pivot transfers: Mod assist (+3)  General transfer comment: pt required (A) to prevent RLE buckle and encouragement due to incr pain in RLE with WB    Ambulation / Gait / Stairs / Wheelchair Mobility  Ambulation/Gait  Ambulation/Gait assistance: Mod assist (+4 mod assist. )  Ambulation Distance (Feet): 5 Feet  Assistive device: Rolling walker (2 wheeled)  Gait Pattern/deviations: Step-to pattern;Decreased dorsiflexion - right;Decreased stance time - right;Decreased weight shift to right  Gait velocity: decreased  Gait velocity interpretation: <1.8 ft/sec, indicative of risk for recurrent falls  General Gait Details: Two person mod assist to support pt's trunk, one person to stabilize and help progress right leg during swing and stance phase of gait and forth person to follow closely with the recliner chair to help encourage maximal gait distance.    Posture / Balance     Special needs/care consideration  BiPAP/CPAP - wears CPAP at night with 2.5 L O2  CPM no  Continuous Drip IV no  Dialysis no  Life Vest no  Oxygen - uses 2.5 L O2 at night only with her CPAP  Special Bed no  Trach Size no  Wound Vac (area) no  Skin - current lumbar back incision  Bowel mgmt:  last BM on 08-28-14  Bladder mgmt: using bedpan  Diabetic mgmt - yes, managed at home with insulin    Previous Home Environment  Living Arrangements: Spouse/significant other  Available Help at Discharge: Family;Available PRN/intermittently (spouse works)  Type of Home: House  Home Layout: One level  Home Access: Stairs to enter  Entrance Stairs-Rails: None  Technical brewer of Steps: 3  Bathroom Shower/Tub: Administrator, Civil Service: [place]: Yes  Type of Home Care Services: Home RT  Discharge Living Setting  Plans for Discharge Living Setting: Patient's home  Type of Home at Discharge: House  Discharge Home Layout: One level  Discharge Home Access: Stairs to enter  Entrance Stairs-Rails: None  Entrance Stairs-Number of Steps: 3  Does the patient have any problems obtaining your medications?: No  Social/Family/Support Systems  Patient Roles: Spouse  Contact Information: husband Sonia Side  Anticipated Caregiver: husband and friend [place]  Anticipated Caregiver's Contact Information: see above  Ability/Limitations of Caregiver: husband does work 3rd shift, friend is available as needed  Caregiver Availability: Intermittent  Discharge Plan Discussed with Primary Caregiver: Yes  Is Caregiver In Agreement with Plan?: Yes  Does Caregiver/Family have Issues with Lodging/Transportation while Pt is in Rehab?: No  Goals/Additional Needs  Patient/Family Goal for Rehab: Mod Ind with PT, Mod Ind and supervision with OT, NA for SLP  Expected length of stay: 10-14 days  Cultural Considerations: none  Dietary Needs: carb modified  Equipment Needs: to be determined  Pt/Family Agrees to Admission and willing to participate: Yes  Program Orientation Provided & Reviewed with Pt/Caregiver Including Roles &  Responsibilities: Yes  Decrease burden of Care through IP rehab admission: NA  Possible need for SNF placement upon discharge: not anticipated  Patient  Condition: This patient's condition remains as documented in the consult dated 08-27-14, in which the Rehabilitation Physician determined and documented that the patient's condition is appropriate for intensive rehabilitative care in an inpatient rehabilitation facility. Will admit to inpatient rehab today.  Preadmission Screen Completed By: Nanetta Batty, PT, 08/28/2014 2:47 PM  ______________________________________________________________________  Discussed status with Dr. Naaman Plummer on 08-28-14 at 1456 and received telephone approval for admission today.  Admission Coordinator: Nanetta Batty, PT, time1456/Date 08-28-14    Cosigned by: Meredith Staggers, MD [08/28/2014 3:36 PM]

## 2014-08-29 NOTE — Progress Notes (Signed)
Kimberly Oyster, MD Physician Signed Physical Medicine and Rehabilitation Consult Note Service date: 08/27/2014 11:32 AM  Related encounter: Admission (Discharged) from 08/23/2014 in [facility] NEUROSCIENCE           Physical Medicine and Rehabilitation Consult   Reason for Consult: L2-3 stenosis with radiculopathy Referring Physician:  Dr. Jordan Likes     HPI: Kimberly [name] is a 58 y.o. female with history of HTN, DM type 2 with neuropathy, CVA 1/15,  severe back and LE pain due to L2/3 spondylosis with severe stenosis and radiculopathy. Patient elected to undergo L2-3 decompressive lam with repair of complex dural laceration on 08/23/14 by Dr. Jordan Likes. Post op on bedrest till 10/05 and patient with reports of RLE numbness and heaviness. OT evaluation done today and patient with RLE instability requiring blocking to prevent buckling with transfer. MD recommending CIR for progressive therapy.    Review of Systems  HENT: Negative for hearing loss.   Eyes: Negative for blurred vision and double vision.  Respiratory: Negative for cough, shortness of breath and wheezing.   Cardiovascular: Negative for chest pain and palpitations.  Gastrointestinal: Negative for heartburn and nausea.  Genitourinary: Positive for urgency and frequency.  Musculoskeletal: Positive for back pain and falls. Negative for myalgias.  Neurological: Positive for tingling, sensory change and focal weakness (RLE). Negative for headaches.        Reports problems with memory since stroke.      Past Medical History   Diagnosis  Date   .  CAD (coronary artery disease)         nonobstructive by cath, 6/08 (false positive Cardiolite) normal stress echo, 7/11   .  Heart failure, diastolic, chronic     .  GERD (gastroesophageal reflux disease)     .  Morbid obesity     .  Obstructive sleep apnea     .  IDDM (insulin dependent diabetes mellitus)     .  HTN (hypertension)     .  Shortness of  breath     .  Asthma     .  Spinal stenosis     .  Chronic back pain     .  Arthritis     .  Degenerative joint disease     .  Glaucoma     .  CHF (congestive heart failure)     .  Complication of anesthesia         had an asthma attack when woke up from procedure   .  Stroke         12/09/2013   .  Pneumonia         as a child   .  Hypothyroidism     .  Depression     .  Hernia of abdominal cavity         "upper and lower hernia"   .  Seizures         PMH: only as a child   .  Heart murmur         PMH:As a child only   .  Headache(784.0)     .  Neuropathy         associated with diabetes   .  Anemia         PMH: as a child   .  Fatty liver     .  Pancreatitis     .  IBS (irritable bowel syndrome)     .  Rheumatic fever         PMH: as a child    Past Surgical History   Procedure  Laterality  Date   .  Eye surgery    1967   .  Tubal ligation    1975   .  Partial hysterectomy    1981   .  Hemorrhoid surgery       .  Hernia repair       .  Spinahatomy    1992   .  Cholecystectomy    1994   .  Ovaries removed    2003   .  Heel (other)    2005   .  Abdominal hysterectomy       .  Bilateral knee arthroscopy    2000, 2009   .  Colonoscopy  N/A  08/01/2013       Procedure: COLONOSCOPY;  Surgeon: Malissa HippoNajeeb U Rehman, MD;  Location: [facility];  Service: Endoscopy;  Laterality: N/A;  930   .  Cataract extraction w/ intraocular lens  implant, bilateral       .  Back surgery       .  Appendectomy       .  Cyst excision           Left breast   .  Cardiac catheterization           2009   .  Lumbar laminectomy/decompression microdiscectomy  Right  08/23/2014       Procedure: LUMBAR LAMINECTOMY/DECOMPRESSION MICRODISCECTOMY 1 LEVEL;  Surgeon: Temple PaciniHenry A Pool, MD;  Location: [facility]URO [facility];  Service: Neurosurgery;  Laterality: Right;  LUMBAR LAMINECTOMY/DECOMPRESSION MICRODISCECTOMY 1 LEVEL LUMBAR 2-3       Family History   Problem  Relation  Age of Onset   .  Cancer  Other     .   Heart failure  Other     .  Colon cancer  Brother        Social History:  Married. Used to work as a Conservation officer, naturecashier at [employer]. Disabled due to back issues since 2010. Sedentary PTA. Has been using a walker since stroke. She  reports that she has never smoked. She has never used smokeless tobacco. She reports that she does not drink alcohol or use illicit drugs.      Allergies   Allergen  Reactions   .  Naproxen  Anaphylaxis       REACTION: throat swelling   .  Other         Local anesthia Had asthma attack   .  Sulfonamide Derivatives         Yeast infection   .  Latex  Rash   .  Tape  Rash       Do not use adhesive tape; okay to use paper tape.       Medications Prior to Admission   Medication  Sig  Dispense  Refill   .  albuterol (VENTOLIN HFA) 108 (90 BASE) MCG/ACT inhaler  Inhale 2 puffs into the lungs 2 (two) times daily.         Marland Kitchen.  amLODipine (NORVASC) 2.5 MG tablet  Take 1 tablet (2.5 mg total) by mouth daily.   30 tablet   11   .  aspirin EC 81 MG tablet  Take 81 mg by mouth daily.         Marland Kitchen.  atorvastatin (LIPITOR) 10 MG tablet  Take 10 mg by mouth daily.         .Marland Kitchen  Calcium-Vitamin D-Vitamin K (VIACTIV) 500-500-40 MG-UNT-MCG CHEW  Chew 1 capsule by mouth daily.         .  carvedilol (COREG) 12.5 MG tablet  Take 12.5 mg by mouth 2 (two) times daily with a meal.         .  conjugated estrogens (PREMARIN) vaginal cream  Place 1 Applicatorful vaginally at bedtime.         Marland Kitchen  doxycycline (ADOXA) 100 MG tablet  Take 100 mg by mouth 2 (two) times daily.         .  famotidine (PEPCID) 40 MG tablet  Take 40 mg by mouth daily.         .  Fluticasone-Salmeterol (ADVAIR DISKUS) 500-50 MCG/DOSE AEPB  Inhale 2 puffs into the lungs 2 (two) times daily.          .  furosemide (LASIX) 20 MG tablet  Take 20-40 mg by mouth daily as needed for fluid.         Marland Kitchen  gabapentin (NEURONTIN) 100 MG capsule  Take 100 mg by mouth every evening.         .  insulin aspart (NOVOLOG FLEXPEN) 100 UNIT/ML  injection  Inject 15-25 Units into the skin 2 (two) times daily. 15 units at lunch, 25 units at supper. Then sliding scale as needed.         .  insulin glargine (LANTUS SOLOSTAR) 100 UNIT/ML injection  Inject 25-40 Units into the skin 2 (two) times daily. 40 @ bedtime, 25 in the morning         .  ipratropium-albuterol (DUONEB) 0.5-2.5 (3) MG/3ML SOLN  Take 3 mLs by nebulization daily as needed (for shortness of breath).          Marland Kitchen  ketotifen (THERA TEARS ALLERGY) 0.025 % ophthalmic solution  Place 2 drops into both eyes 2 (two) times daily. *Uses drops 2 to 4 times daily*         .  Levothyroxine Sodium 75 MCG CAPS  Take 1 capsule by mouth daily.          .  Liraglutide (VICTOZA) 18 MG/3ML SOPN  Inject 1.8 mg into the skin daily.         Marland Kitchen  lisinopril (PRINIVIL,ZESTRIL) 10 MG tablet  Take 1 tablet (10 mg total) by mouth daily.   30 tablet   11   .  NON FORMULARY  CPAP: use as directed           .  OXYGEN-HELIUM IN  Inhale 2.5 L into the lungs at bedtime. OXYGEN 2L: use as directed with cpap         .  pantoprazole (PROTONIX) 40 MG tablet  Take 1 tablet (40 mg total) by mouth 2 (two) times daily.   60 tablet   0   .  rOPINIRole (REQUIP) 1 MG tablet  Take 1 mg by mouth at bedtime.         Marland Kitchen  HYDROcodone-acetaminophen (NORCO) 7.5-325 MG per tablet  Take 1 tablet by mouth daily as needed for moderate pain.            Home: Home Living Family/patient expects to be discharged to:: Private residence Living Arrangements: Spouse/significant other Available Help at Discharge: Family;Available PRN/intermittently (spouse works) Type of Home: House Home Access: Stairs to enter Secretary/administrator of Steps: 3 Entrance Stairs-Rails: None Home Layout: One level Home Equipment: Environmental consultant - 2 wheels;Cane - single point;Shower seat;Hand held shower head  Functional History: Prior Function Level of Independence: Independent with assistive device(s) Comments: reports using RW in the house and cane at the  grocery store Functional Status:   Mobility: Bed Mobility Overal bed mobility: Needs Assistance Bed Mobility: Rolling;Sidelying to Sit Rolling: Min assist Supine to sit: Min assist General bed mobility comments: cues for back precautions and safety, USE OF RAIL TO SELF ASSIST INTO SITTING FROM l SIDE.  Transfers Overall transfer level: Needs assistance Equipment used: Rolling walker (2 wheeled) Transfers: Sit to/from Stand Sit to Stand: Mod assist;From elevated surface Stand pivot transfers: Max assist General transfer comment: cues for safety hand placement to stand from Chair at [facility]. R knee stabilized externally to prevent buckling.  patient practiced tapping toes on L foot while standing to shift weight to R leg, R knee stabilized externally.STEDY was utilized to get from bed to recliner safely.   ADL: ADL Overall ADL's : Needs assistance/impaired Eating/Feeding: Independent;Bed level Grooming: Oral care;Wash/dry face;Sitting;Modified independent Grooming Details (indicate cue type and reason): completed oral care in chair Upper Body Bathing: Minimal assitance;Sitting Lower Body Bathing: Total assistance;Sit to/from stand Toilet Transfer: Maximal assistance;Stand-pivot;RW;BSC Toilet Transfer Details (indicate cue type and reason): pt requires (A) To block R LE to prevent buckling Functional mobility during ADLs:  (unable to complete due to RLE buckle) General ADL Comments: Pt reports nausea with sitting EOB. pt vomitting small amount no medication in vomit. Pt reports feeling better after vomit.  Pt positioned in chair with all items in reach   Cognition: Cognition Overall Cognitive Status: Within Functional Limits for tasks assessed Orientation Level: Oriented X4 Cognition Arousal/Alertness: Awake/alert Behavior During Therapy: WFL for tasks assessed/performed Overall Cognitive Status: Within Functional Limits for tasks assessed   Blood pressure 125/78, pulse 60,  temperature 98.2 F (36.8 C), temperature source Oral, resp. rate 20, height 5' (1.524 m), weight 110.678 kg (244 lb), SpO2 98.00%. Physical Exam  Nursing note and vitals reviewed. Constitutional: She is oriented to person, place, and time. She appears well-developed and well-nourished.  HENT:   Head: Normocephalic and atraumatic.  Eyes: Conjunctivae are normal. Pupils are equal, round, and reactive to light.  Neck: Normal range of motion. Neck supple.  Cardiovascular: Normal rate and regular rhythm.   Respiratory: Effort normal and breath sounds normal. No respiratory distress. She has no wheezes.  GI: Soft. Bowel sounds are normal. She exhibits no distension. There is no tenderness.  Musculoskeletal:  Low back tenderness.   Neurological: She is alert and oriented to person, place, and time.  Able to activate RLE minimally at hip. Decreased sensation to PP and LT RLE more than LLE. RLE: 1-2/5 HF, KE and 2/5 ADF/APF.   Skin: Skin is warm and dry.  Psychiatric: Her speech is normal. Thought content normal. Her affect is blunt.     Results for orders placed during the hospital encounter of 08/23/14 (from the past 24 hour(s))   GLUCOSE, CAPILLARY     Status: Abnormal     Collection Time      08/26/14  4:23 PM       Result  Value  Ref Range     Glucose-Capillary  108 (*)  70 - 99 mg/dL     Comment 1  Notify RN        Comment 2  Documented in Chart      GLUCOSE, CAPILLARY     Status: Abnormal     Collection Time      08/26/14  9:51 PM  Result  Value  Ref Range     Glucose-Capillary  231 (*)  70 - 99 mg/dL     Comment 1  Notify RN        Comment 2  Documented in Chart      GLUCOSE, CAPILLARY     Status: Abnormal     Collection Time      08/27/14  6:52 AM       Result  Value  Ref Range     Glucose-Capillary  102 (*)  70 - 99 mg/dL     Comment 1  Notify RN        Comment 2  Documented in Chart      GLUCOSE, CAPILLARY     Status: Abnormal     Collection Time      08/27/14 12:06  PM       Result  Value  Ref Range     Glucose-Capillary  159 (*)  70 - 99 mg/dL     Comment 1  Notify RN        Comment 2  Documented in Chart       No results found.   Assessment/Plan: Diagnosis: lumbar stenosis with neurogenic claudication Does the need for close, 24 hr/day medical supervision in concert with the patient's rehab needs make it unreasonable for this patient to be served in a less intensive setting? Yes Co-Morbidities requiring supervision/potential complications: chronic diastolic heart failure Due to bladder management, bowel management, safety, skin/wound care, disease management, medication administration, pain management and patient education, does the patient require 24 hr/day rehab nursing? Yes Does the patient require coordinated care of a physician, rehab nurse, PT (1-2 hrs/day, 5 days/week) and OT (1-2 hrs/day, 5 days/week) to address physical and functional deficits in the context of the above medical diagnosis(es)? Yes Addressing deficits in the following areas: balance, endurance, locomotion, strength, transferring, bowel/bladder control, bathing, dressing, feeding, grooming, toileting and psychosocial support Can the patient actively participate in an intensive therapy program of at least 3 hrs of therapy per day at least 5 days per week? Yes The potential for patient to make measurable gains while on inpatient rehab is excellent Anticipated functional outcomes upon discharge from inpatient rehab are modified independent  with PT, modified independent to supervision with OT, n/a with SLP. Estimated rehab length of stay to reach the above functional goals is: 10-14 days Does the patient have adequate social supports to accommodate these discharge functional goals? Yes Anticipated D/C setting: Home Anticipated post D/C treatments: HH therapy and Outpatient therapy Overall Rehab/Functional Prognosis: excellent   RECOMMENDATIONS: This patient's condition is  appropriate for continued rehabilitative care in the following setting: CIR Patient has agreed to participate in recommended program. Yes Note that insurance prior authorization may be required for reimbursement for recommended care.   Comment: Pt with ongoing significant RLE weakness and sensory loss. Pain has been an obstacle as well. Would benefit from our program. She would like to discuss with husband. Rehab Admissions Coordinator to follow up.   Thanks,   Kimberly Oyster, MD, [place] [facility]         08/27/2014    Revision History...     Date/Time User Action   08/27/2014 3:27 PM Kimberly Oyster, MD Sign   08/27/2014 12:39 PM Jacquelynn Cree, PA-C Share  View Details Report   Routing History...     Date/Time From To Method   08/27/2014 3:27 PM Kimberly Oyster, MD Kimberly Oyster, MD In Basket  08/27/2014 3:27 PM Kimberly Oyster, MD Ignatius Specking, MD Fax

## 2014-08-30 ENCOUNTER — Inpatient Hospital Stay (HOSPITAL_COMMUNITY): Payer: Medicare Other | Admitting: Rehabilitation

## 2014-08-30 ENCOUNTER — Inpatient Hospital Stay (HOSPITAL_COMMUNITY): Payer: Medicare Other | Admitting: Occupational Therapy

## 2014-08-30 DIAGNOSIS — M792 Neuralgia and neuritis, unspecified: Secondary | ICD-10-CM

## 2014-08-30 LAB — GLUCOSE, CAPILLARY
Glucose-Capillary: 80 mg/dL (ref 70–99)
Glucose-Capillary: 142 mg/dL — ABNORMAL HIGH (ref 70–99)
Glucose-Capillary: 107 mg/dL — ABNORMAL HIGH (ref 70–99)
Glucose-Capillary: 127 mg/dL — ABNORMAL HIGH (ref 70–99)

## 2014-08-30 NOTE — Progress Notes (Signed)
Subjective/Complaints: 58 y.o. 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 Patient elected to undergo L2-3 decompressive lam with repair of complex dural laceration on 08/23/14 by Dr. Annette Stable. Post op on bedrest till 10/05 and patient with reports of RLE numbness and heaviness. Patient continues to be limited by RLE instability requiring blocking to prevent buckling with transfer.    TRamadol helping with pain, no sedation  Review of Systems - Negative except burning pain R>L thigh Objective: Vital Signs: Blood pressure 126/76, pulse 54, temperature 98.5 F (36.9 C), temperature source Oral, resp. rate 18, height 5' (1.524 m), weight 112.3 kg (247 lb 9.2 oz), SpO2 100.00%. No results found. Results for orders placed during the hospital encounter of 08/28/14 (from the past 72 hour(s))  URINALYSIS, ROUTINE W REFLEX MICROSCOPIC     Status: None   Collection Time    08/28/14 11:23 PM      Result Value Ref Range   Color, Urine YELLOW  YELLOW   APPearance CLEAR  CLEAR   Specific Gravity, Urine 1.007  1.005 - 1.030   pH 7.5  5.0 - 8.0   Glucose, UA NEGATIVE  NEGATIVE mg/dL   Hgb urine dipstick NEGATIVE  NEGATIVE   Bilirubin Urine NEGATIVE  NEGATIVE   Ketones, ur NEGATIVE  NEGATIVE mg/dL   Protein, ur NEGATIVE  NEGATIVE mg/dL   Urobilinogen, UA 1.0  0.0 - 1.0 mg/dL   Nitrite NEGATIVE  NEGATIVE   Leukocytes, UA NEGATIVE  NEGATIVE   Comment: MICROSCOPIC NOT DONE ON URINES WITH NEGATIVE PROTEIN, BLOOD, LEUKOCYTES, NITRITE, OR GLUCOSE <1000 mg/dL.  URINE CULTURE     Status: None   Collection Time    08/28/14 11:23 PM      Result Value Ref Range   Specimen Description URINE, CLEAN CATCH     Special Requests doxycyline     Culture  Setup Time       Value: 08/29/2014 04:06     Performed at [facility]       Value: NO GROWTH     Performed at [facility]   Culture       Value: NO GROWTH     Performed at [facility]   Report Status 08/29/2014 FINAL    CBC WITH DIFFERENTIAL     Status: Abnormal   Collection Time    08/29/14  4:35 AM      Result Value Ref Range   WBC 4.7  4.0 - 10.5 K/uL   RBC 3.01 (*) 3.87 - 5.11 MIL/uL   Hemoglobin 9.7 (*) 12.0 - 15.0 g/dL   HCT 28.6 (*) 36.0 - 46.0 %   MCV 95.0  78.0 - 100.0 fL   MCH 32.2  26.0 - 34.0 pg   MCHC 33.9  30.0 - 36.0 g/dL   RDW 15.0  11.5 - 15.5 %   Platelets 158  150 - 400 K/uL   Neutrophils Relative % 45  43 - 77 %   Neutro Abs 2.1  1.7 - 7.7 K/uL   Lymphocytes Relative 38  12 - 46 %   Lymphs Abs 1.8  0.7 - 4.0 K/uL   Monocytes Relative 9  3 - 12 %   Monocytes Absolute 0.4  0.1 - 1.0 K/uL   Eosinophils Relative 8 (*) 0 - 5 %   Eosinophils Absolute 0.4  0.0 - 0.7 K/uL  Basophils Relative 0  0 - 1 %   Basophils Absolute 0.0  0.0 - 0.1 K/uL  COMPREHENSIVE METABOLIC PANEL     Status: Abnormal   Collection Time    08/29/14  4:35 AM      Result Value Ref Range   Sodium 140  137 - 147 mEq/L   Potassium 4.4  3.7 - 5.3 mEq/L   Chloride 103  96 - 112 mEq/L   CO2 30  19 - 32 mEq/L   Glucose, Bld 159 (*) 70 - 99 mg/dL   BUN 9  6 - 23 mg/dL   Creatinine, Ser 0.72  0.50 - 1.10 mg/dL   Calcium 8.8  8.4 - 10.5 mg/dL   Total Protein 6.3  6.0 - 8.3 g/dL   Albumin 2.7 (*) 3.5 - 5.2 g/dL   AST 65 (*) 0 - 37 U/L   ALT 32  0 - 35 U/L   Alkaline Phosphatase 156 (*) 39 - 117 U/L   Total Bilirubin 0.9  0.3 - 1.2 mg/dL   GFR calc non Af Amer >90  >90 mL/min   GFR calc Af Amer >90  >90 mL/min   Comment: (NOTE)     The eGFR has been calculated using the CKD EPI equation.     This calculation has not been validated in all clinical situations.     eGFR's persistently <90 mL/min signify possible Chronic Kidney     Disease.   Anion gap 7  5 - 15  GLUCOSE, CAPILLARY     Status: Abnormal   Collection Time    08/29/14  6:38 AM      Result Value Ref Range   Glucose-Capillary 150 (*) 70 - 99  mg/dL  GLUCOSE, CAPILLARY     Status: Abnormal   Collection Time    08/29/14 11:28 AM      Result Value Ref Range   Glucose-Capillary 156 (*) 70 - 99 mg/dL   Comment 1 Notify RN    GLUCOSE, CAPILLARY     Status: Abnormal   Collection Time    08/29/14  4:35 PM      Result Value Ref Range   Glucose-Capillary 162 (*) 70 - 99 mg/dL  GLUCOSE, CAPILLARY     Status: Abnormal   Collection Time    08/29/14  9:56 PM      Result Value Ref Range   Glucose-Capillary 161 (*) 70 - 99 mg/dL  GLUCOSE, CAPILLARY     Status: None   Collection Time    08/30/14  6:34 AM      Result Value Ref Range   Glucose-Capillary 80  70 - 99 mg/dL     HEENT: normal Cardio: RRR and no murmur Resp: CTA B/L and unlabored GI: BS positive and NT,ND Extremity:  Pulses positive and No Edema Skin:   Wound SEROSANG drainage lumbar incision without erythema Neuro: Alert/Oriented, Anxious and Abnormal Sensory decreased R> L ant thigh Musc/Skel:  Normal Gen NAD   Assessment/Plan: 1. Functional deficits secondary to Lumbar stenosis with neurogenic claudication, s/p decompression and fusion which require 3+ hours per day of interdisciplinary therapy in a comprehensive inpatient rehab setting. Physiatrist is providing close team supervision and 24 hour management of active medical problems listed below. Physiatrist and rehab team continue to assess barriers to discharge/monitor patient progress toward functional and medical goals. FIM: FIM - Bathing Bathing Steps Patient Completed: Chest;Right Arm;Left Arm;Abdomen;Right upper leg;Left upper leg Bathing: 3: Mod-Patient completes 5-7 27f10 parts or 50-74%  FIM - Upper Body Dressing/Undressing Upper body dressing/undressing: 0: Wears gown/pajamas-no public clothing FIM - Lower Body Dressing/Undressing Lower body dressing/undressing: 0: Wears gown/pajamas-no public clothing  FIM - Toileting Toileting: 1: Total-Patient completed zero steps, helper did all 3  FIM -  Radio producer Devices: Recruitment consultant Transfers: 3-To toilet/BSC: Mod A (lift or lower assist)  FIM - Control and instrumentation engineer Devices: Bed rails;Arm rests Bed/Chair Transfer: 4: Supine > Sit: Min A (steadying Pt. > 75%/lift 1 leg);4: Sit > Supine: Min A (steadying pt. > 75%/lift 1 leg);4: Bed > Chair or W/C: Min A (steadying Pt. > 75%);4: Chair or W/C > Bed: Min A (steadying Pt. > 75%)  FIM - Locomotion: Wheelchair Distance: 60 Locomotion: Wheelchair: 2: Travels 50 - 149 ft with minimal assistance (Pt.>75%) FIM - Locomotion: Ambulation Locomotion: Ambulation Assistive Devices: Administrator Ambulation/Gait Assistance: 1: +2 Total assist;3: Mod assist (+2 for chair follow) Locomotion: Ambulation: 1: Two helpers  Comprehension Comprehension Mode: Auditory Comprehension: 5-Follows basic conversation/direction: With no assist  Expression Expression Mode: Verbal Expression: 5-Expresses basic needs/ideas: With no assist  Social Interaction Social Interaction: 6-Interacts appropriately with others with medication or extra time (anti-anxiety, antidepressant).  Problem Solving Problem Solving: 6-Solves complex problems: With extra time  Memory Memory: 5-Recognizes or recalls 90% of the time/requires cueing < 10% of the time  Medical Problem List and Plan:  1. Functional deficits secondary to lumbar stenosis with neurogenic claudication  2. DVT Prophylaxis/Anticoagulation: Pharmaceutical: Lovenox  3. Pain Management: Continue neurontin for neuropathy and trial ultram for pain management. Flexeril effective for spasms.  4. Mood: Mood stable and appears brighter today. LCSW to follow for evaluation and support.  5. Neuropsych: This patient is capable of making decisions on her own behalf.  6. Skin/Wound Care: Pressure relief measures--turn 2-3 hours at night to help with pressure and pain relief. . Monitor hydration and  nutritional status additionally.  7. Fluids/Electrolytes/Nutrition: Monitor I/O. Check labs in am.  8. DM type 2 with diabetic neuropathy: Monitor blood sugars ac/hs. Patient has had low BS and has refused lantus for past 24 hours--encourage use of lantus 25 units am and 40 units in pm. Off Victoza at this time. Will decrease meal coverage to prevent hypoglycemic episodes.  9. HTN: Monitor BP every 8 hours. Continue lisinopril, coreg and Norvasc.  10 Non-obstructive CAD: Resume ASA. Continue Coreg, Lipitor and Lisinopril  11. OSA: Continue CPAP with oxygen bled in.  12. Pulmonary HTN/ Asthma: Will encourage IS. Advair bid with duonebs prn SOB/wheezing.  13. ABLA: Improved past transfusion. Will check follow up labs in am.  14. . Hypokalemia: Likely dilutional from IVF. Will discontinue and encourage po intake. Check labs in am.  15. Dysuria: She reports that she completed doxycyline PTA. Will discontinue and check UA/UCS due to complaints of dysuria.    LOS (Days) 2 A FACE TO FACE EVALUATION WAS PERFORMED  Johnnette Laux E 08/30/2014, 8:01 AM

## 2014-08-30 NOTE — Progress Notes (Signed)
Physical Therapy Session Note  Patient Details  Name: Kimberly [name] MRN: [id number] Date of Birth: 07-30-1956  Today's Date: 08/30/2014 PT Individual Time: 1500-1530 PT Individual Time Calculation (min): 30 min   Short Term Goals: Week 1:  PT Short Term Goal 1 (Week 1): Pt will perform all aspects of bed mobility at S level, maintianing back precautions PT Short Term Goal 2 (Week 1): Pt will perform sit<>stand at min A level consistently PT Short Term Goal 3 (Week 1): Pt will self propel w/c x 100' with BUEs at S level  PT Short Term Goal 4 (Week 1): Pt will ambulate x 15' with RW at mod A (+2 for chair follow)  Skilled Therapeutic Interventions/Progress Updates:   Pt received sitting in w/c in room, agreeable to therapy session.  Pt self propelled to therapy gym in w/c with BUEs in order to increase overall strengthening and endurance.  Pt able to complete 100' without rest break this afternoon.  Performed 23' x 1 and 20' x 1 with RW at min A (+2 for chair follow for safety) with continued cues for upright posture, looking ahead, step length and sequencing with RW.  She continues to veer to the R with gait and can only correct with cuing and intermittent need for assist.  Ended session with seated nustep x 10 mins at level 2 resistance with BLEs only to prevent trunk twisting for overall strengthening and endurance.  Tolerated well with only single rest break.  Transferred back to w/c via squat pivot at min A.  Pt handed off to OT for next session.  Therapy Documentation Precautions:  Precautions Precautions: Fall;Back Precaution Booklet Issued: Yes (comment) Precaution Comments: RLE buckling with WB Restrictions Weight Bearing Restrictions: No   Vital Signs: Therapy Vitals Temp: 98.1 F (36.7 C) Temp Source: Oral Pulse Rate: 60 Resp: 18 BP: 97/72 mmHg Patient Position (if appropriate): Lying Oxygen Therapy SpO2: 94 % O2 Device: None (Room air) Pain: Pain Assessment Pain  Score: Asleep Pain Type: Surgical pain Pain Location: Back Pain Intervention(s): Medication (See eMAR)  See FIM for current functional status  Therapy/Group: Individual Therapy  Vista Deckarcell, Baylen Dea Ann 08/30/2014, 3:22 PM

## 2014-08-30 NOTE — Progress Notes (Signed)
Overall progressing well.  Making progress with therapy.  Right LE strength improving, still with rather dense sensory loss.  Wound healing well with minimal drainage.  No evidence of CSF leak  No new recs. Cont rehab efforts

## 2014-08-30 NOTE — IPOC Note (Addendum)
Overall Plan of Care [facility](IPOC) Patient Details Name: Kimberly [name] MRN: [id number] DOB: 1956/08/26  Admitting Diagnosis: sp l2 3 decom lami  Hospital Problems: Principal Problem:   Lumbar stenosis with neurogenic claudication Active Problems:   Diabetes   Essential hypertension   Obstructive sleep apnea   Nerve pain    . Functional Problem List: Nursing Endurance  PT Balance;Endurance;Motor;Safety  OT Balance;Endurance;Motor;Pain;Safety;Sensory  SLP    TR Activity tolerance, functional mobility, balance, safety, pain, anxiety/stress        Basic ADL's: OT Grooming;Bathing;Dressing;Toileting     Advanced  ADL's: OT Simple Meal Preparation     Transfers: PT Bed Mobility;Bed to Chair;Car;Furniture  OT Toilet;Tub/Shower     Locomotion: PT Ambulation;Wheelchair Mobility;Stairs     Additional Impairments: OT None  SLP        TR      Anticipated Outcomes Item Anticipated Outcome  Self Feeding    Swallowing      Basic self-care  supervision  Toileting  supervision   Bathroom Transfers supervision  Bowel/Bladder  Bladder  Transfers  S  Locomotion  S for short distances  Communication     Cognition     Pain  Pain less than 5  Safety/Judgment  Mod I   Therapy Plan: PT Intensity: Minimum of 1-2 x/day ,45 to 90 minutes PT Frequency: 5 out of 7 days PT Duration Estimated Length of Stay: 14-16 days  OT Intensity: Minimum of 1-2 x/day, 45 to 90 minutes OT Frequency: 5 out of 7 days OT Duration/Estimated Length of Stay: 14 days TR Duration/ELOS: 10 Days TR Frequency:  Min 1 time per week >20 minutes           Team Interventions: Nursing Interventions Pain Management  PT interventions Balance/vestibular training;Ambulation/gait training;Community reintegration;Discharge planning;Disease management/prevention;DME/adaptive equipment instruction;Functional mobility training;Neuromuscular re-education;Patient/family education;Psychosocial  support;Splinting/orthotics;Stair training;Therapeutic Activities;Therapeutic Exercise;UE/LE Strength taining/ROM;Wheelchair propulsion/positioning;UE/LE Coordination activities  OT Interventions Balance/vestibular training;Patient/family education;Self Care/advanced ADL retraining;Therapeutic Exercise;UE/LE Coordination activities;UE/LE Strength taining/ROM;Therapeutic Activities;Psychosocial support;Pain management;Functional mobility training;DME/adaptive equipment instruction;Discharge planning  SLP Interventions    TR Interventions  Recreation/leisure participation, Balance/Vestibular training, functional mobility, therapeutic activities, UE/LE strength/coordination, w/c mobility, community reintegration, pt/family education, adaptive equipment instruction/use, discharge planning, psychosocial support   SW/CM Interventions Discharge Planning;Psychosocial Support;Patient/Family Education    Team Discharge Planning: Destination: PT-Home ,OT- Home , SLP-  Projected Follow-up: PT-Home health PT;24 hour supervision/assistance, OT-  Other (comment) (HHOT vs outpatient OT services), SLP-  Projected Equipment Needs: PT-Wheelchair (measurements);Wheelchair cushion (measurements), OT- Tub/shower bench, SLP-  Equipment Details: PT- , OT-Pt reports owning rollator, RW, SPC, and shower chair Patient/family involved in discharge planning: PT- Patient,  OT-Patient, SLP-   MD ELOS: 14-15 days Medical Rehab Prognosis:  Excellent Assessment: The patient has been admitted for CIR therapies with the diagnosis of lumbar stenosis with radiculopathy s/p lami/fusion. The team will be addressing functional mobility, strength, stamina, balance, safety, adaptive techniques and equipment, self-care, bowel and bladder mgt, patient and caregiver education, NMR, pain mgt, back precautions, wound care, ego support. Goals have been set at supervision for mobility and self-care.   Ranelle OysterZachary T. Swartz, MD, FAAPMR      See  Team Conference Notes for weekly updates to the plan of care

## 2014-08-30 NOTE — Progress Notes (Signed)
Physical Therapy Session Note  Patient Details  Name: Kimberly [name] MRN: [id number] Date of Birth: December 17, 1955  Today's Date: 08/30/2014 PT Individual Time: 1000-1100 PT Individual Time Calculation (min): 60 min   Short Term Goals: Week 1:  PT Short Term Goal 1 (Week 1): Pt will perform all aspects of bed mobility at S level, maintianing back precautions PT Short Term Goal 2 (Week 1): Pt will perform sit<>stand at min A level consistently PT Short Term Goal 3 (Week 1): Pt will self propel w/c x 100' with BUEs at S level  PT Short Term Goal 4 (Week 1): Pt will ambulate x 15' with RW at mod A (+2 for chair follow)  Skilled Therapeutic Interventions/Progress Updates:   Pt received sitting in w/c in room, agreeable to therapy session.  Pt self propelled >100' to/from therapy gym using BUEs to increase overall strength and endurance.  Pt able to propel all the way to gym without rest break today.  Performed two bouts of gait x 12' x 1 and 13' x 1 with RW at min A (+2 for close chair follow in case of R knee buckle). Therapist provided assist at hips and at R knee for safety and prevent knee buckle.  Did not note any overt knee buckle and pt very good at knowing her limits during session.  Cues for sequencing and technique, but noted improved confidence and technique as she continued with gait.  Performed stand pivot transfer with RW to nustep at min A (+2 for safety) and performed seated nustep x 6 mins at level 2 resistance with BLEs only to increase LE strength and endurance.  Pt stating her R leg feels very weak, therefore performed squat pivot transfer to w/c at min A level with cues for hand placement and technique.  Transferred w/c<>mat in same manner.  Performed 5 reps of sit<>stand in order to improve midline standing and increase WB and strength through RLE.  Pt tends to lean far to the L when standing but can weight shift with cues and assist.  Pt also requires max verbal cues and  demonstration on breathing technique when standing.  Ended session with seated therex with BLE LAQ's x 10 reps, hip abd with orange theraband x 10 reps and pillow squeeze x 10 reps.  Discussed that she could do these while in w/c to assist with strengthening.  Pt self propelled back to room as stated above.  Transferred to recliner and all needs in reach.    Therapy Documentation Precautions:  Precautions Precautions: Fall;Back Precaution Booklet Issued: Yes (comment) Precaution Comments: RLE buckling with WB Restrictions Weight Bearing Restrictions: No   Vital Signs: Therapy Vitals Temp: 98.5 F (36.9 C) Temp Source: Oral Pulse Rate: 54 Resp: 18 BP: 126/76 mmHg Patient Position (if appropriate): Lying Oxygen Therapy SpO2: 100 % O2 Device: None (Room air) Pain: Pain Assessment Pain Assessment: 0-10 Pain Score: 6  Pain Type: Surgical pain Pain Location: Back Pain Orientation: Mid;Lower Pain Radiating Towards: hips, legs Pain Descriptors / Indicators: Burning;Constant;Aching Pain Onset: On-going Patients Stated Pain Goal: 6 Pain Intervention(s): Medication (See eMAR)  See FIM for current functional status  Therapy/Group: Individual Therapy  Vista Deckarcell, Kodi Guerrera Ann 08/30/2014, 8:44 AM

## 2014-08-30 NOTE — Progress Notes (Signed)
Occupational Therapy Session Note  Patient Details  Name: Kimberly [name] MRN: [id number] Date of Birth: 08/25/1956  Today's Date: 08/30/2014 OT Individual Time: [id number] and 1530-1605 OT Individual Time Calculation (min): 60 min and 35 min   Short Term Goals: Week 1:  OT Short Term Goal 1 (Week 1): Pt will perform bathing with min A in order to increase I in self care. OT Short Term Goal 2 (Week 1): Pt will perform toileting with min A in order to increase I in self care. OT Short Term Goal 3 (Week 1): Pt will perform toilet transfer with min A in order to increase I in functional transfers. OT Short Term Goal 4 (Week 1): Pt will perform shower transfer with min A in order to increase I in functional transfers. OT Short Term Goal 5 (Week 1): Pt will tolerate 15 minutes of ADL session without requiring rest break in order to increase endurance.   Skilled Therapeutic Interventions/Progress Updates:  Session 1:Pt with 7/10 c/o pain this session in lower back. RN providing pain medication upon entering the room. Pt seated in wheelchair upon entering the room. Therapist provided set up for w/c transfer to TTB. Pt performed squat pivot w/c <> TTB with Mod A. OT educated pt on use of LH sponge to wash back and B feet while bathing. Pt demonstrated understanding but required assist to wash buttocks and peri area. Dressing preformed seated in wheelchair at sinkside with max A for LB dressing with use of RW. Pt with posterior lean when standing for clothing management. Grooming performed with set up A with pt seated in wheelchair at sink side. Pt seated in wheelchair with call bell and all needed items within reach upon exiting the room.   Session 2: Pt transitioned easily from PT session. Pt with 7/10 c/o pain in lower back and called for RN to bring medication during session. Pt propelled wheelchair ~ 150 feet with increased time and use of B UEs to room. Pt propelled self into bathroom with assist  from therapist for set up. Transferred w/c <> standard height toilet with Mod A for balance and use of grab bar. Toileting with Min - Mod A for balance while standing to perform clothing management. Pt requiring 2 rest breaks while propelling wheelchair to room. Therapist began energy conservation education of general principles and ADL tasks. Pt seated in wheelchair with call bell within reach upon exiting the room.   Therapy Documentation Precautions:  Precautions Precautions: Fall;Back Precaution Booklet Issued: Yes (comment) Precaution Comments: RLE buckling with WB Restrictions Weight Bearing Restrictions: No  See FIM for current functional status  Therapy/Group: Individual Therapy  Lowella Gripittman, Samora Jernberg L 08/30/2014, 12:47 PM

## 2014-08-31 ENCOUNTER — Inpatient Hospital Stay (HOSPITAL_COMMUNITY): Payer: Medicare Other | Admitting: Rehabilitation

## 2014-08-31 ENCOUNTER — Encounter (HOSPITAL_COMMUNITY): Payer: Medicare Other | Admitting: Occupational Therapy

## 2014-08-31 ENCOUNTER — Inpatient Hospital Stay (HOSPITAL_COMMUNITY): Payer: Medicare Other | Admitting: Occupational Therapy

## 2014-08-31 DIAGNOSIS — I10 Essential (primary) hypertension: Secondary | ICD-10-CM

## 2014-08-31 DIAGNOSIS — G8310 Monoplegia of lower limb affecting unspecified side: Secondary | ICD-10-CM

## 2014-08-31 LAB — GLUCOSE, CAPILLARY
GLUCOSE-CAPILLARY: 96 mg/dL (ref 70–99)
GLUCOSE-CAPILLARY: 165 mg/dL — AB (ref 70–99)
GLUCOSE-CAPILLARY: 53 mg/dL — AB (ref 70–99)
Glucose-Capillary: 121 mg/dL — ABNORMAL HIGH (ref 70–99)
Glucose-Capillary: 64 mg/dL — ABNORMAL LOW (ref 70–99)
Glucose-Capillary: 157 mg/dL — ABNORMAL HIGH (ref 70–99)

## 2014-08-31 MED ORDER — WHITE PETROLATUM GEL
Status: AC
  Administered 2014-08-31: 12:00:00
  Filled 2014-08-31: qty 5

## 2014-08-31 MED ORDER — INSULIN GLARGINE 100 UNIT/ML ~~LOC~~ SOLN
30.0000 [IU] | Freq: Every day | SUBCUTANEOUS | Status: DC
  Administered 2014-08-31 – 2014-09-06 (×7): 30 [IU] via SUBCUTANEOUS
  Filled 2014-08-31 (×7): qty 0.3

## 2014-08-31 NOTE — Progress Notes (Signed)
Kimberly [name] is a 58 y.o. female 1956/10/04 [id number]  Subjective: No new complaints. No new problems. Slept well. Feeling OK.  Objective: Vital signs in last 24 hours: Temp:  [97.8 F (36.6 C)-98.1 F (36.7 C)] 98 F (36.7 C) (10/10 0514) Pulse Rate:  [53-64] 53 (10/10 0514) Resp:  [16-18] 17 (10/10 0514) BP: (97-111)/(54-72) 108/55 mmHg (10/10 0514) SpO2:  [94 %-97 %] 96 % (10/10 0514) Weight change:  Last BM Date: 08/29/14  Intake/Output from previous day: 10/09 0701 - 10/10 0700 In: 840 [P.O.:840] Out: -  Last cbgs: CBG (last 3)   Recent Labs  08/30/14 1636 08/30/14 2048 08/31/14 0624  GLUCAP 107* 127* 96     Physical Exam General: No apparent distress   HEENT: not dry Lungs: Normal effort. Lungs clear to auscultation, no crackles or wheezes. Cardiovascular: Regular rate and rhythm, no edema Abdomen: S/NT/ND; BS(+) Musculoskeletal:  unchanged Neurological: No new neurological deficits Wounds: clear   Skin: clear  Aging changes Mental state: Alert, oriented, cooperative    Lab Results: BMET    Component Value Date/Time   NA 140 08/29/2014 0435   K 4.4 08/29/2014 0435   CL 103 08/29/2014 0435   CO2 30 08/29/2014 0435   GLUCOSE 159* 08/29/2014 0435   BUN 9 08/29/2014 0435   CREATININE 0.72 08/29/2014 0435   CALCIUM 8.8 08/29/2014 0435   GFRNONAA >90 08/29/2014 0435   GFRAA >90 08/29/2014 0435   CBC    Component Value Date/Time   WBC 4.7 08/29/2014 0435   RBC 3.01* 08/29/2014 0435   HGB 9.7* 08/29/2014 0435   HCT 28.6* 08/29/2014 0435   PLT 158 08/29/2014 0435   MCV 95.0 08/29/2014 0435   MCH 32.2 08/29/2014 0435   MCHC 33.9 08/29/2014 0435   RDW 15.0 08/29/2014 0435   LYMPHSABS 1.8 08/29/2014 0435   MONOABS 0.4 08/29/2014 0435   EOSABS 0.4 08/29/2014 0435   BASOSABS 0.0 08/29/2014 0435    Studies/Results: No results found.  Medications: I have reviewed the patient's current medications.  Assessment/Plan:  1. Functional deficits secondary to  lumbar stenosis with neurogenic claudication  2. DVT Prophylaxis/Anticoagulation: Pharmaceutical: Lovenox  3. Pain Management: Continue neurontin for neuropathy and trial ultram for pain management. Flexeril effective for spasms.  4. Mood: Mood stable and appears brighter today. LCSW to follow for evaluation and support.  5. Neuropsych: This patient is capable of making decisions on her own behalf.  6. Skin/Wound Care: Pressure relief measures--turn 2-3 hours at night to help with pressure and pain relief. . Monitor hydration and nutritional status additionally.  7. Fluids/Electrolytes/Nutrition: Monitor I/O. Check labs in am.  8. DM type 2 with diabetic neuropathy: Monitor blood sugars ac/hs. Patient has had low BS and has refused lantus for past 24 hours--encourage use of lantus 25 units am and 40 units in pm. Off Victoza at this time. Will decrease meal coverage to prevent hypoglycemic episodes.  9. HTN: Monitor BP every 8 hours. Continue lisinopril, coreg and Norvasc.  10 Non-obstructive CAD: Resume ASA. Continue Coreg, Lipitor and Lisinopril  11. OSA: Continue CPAP with oxygen bled in.  12. Pulmonary HTN/ Asthma: Will encourage IS. Advair bid with duonebs prn SOB/wheezing.  13. ABLA: Improved past transfusion. Will check follow up labs in am.  14. . Hypokalemia: Likely dilutional from IVF. Will discontinue and encourage po intake. Check labs in am.  15. Dysuria: She reports that she completed doxycyline PTA. Will discontinue and check UA/UCS due to complaints of dysuria.  Length of stay, days: 3  Sonda PrimesAlex Kendry Pfarr , MD 08/31/2014, 9:24 AM

## 2014-08-31 NOTE — Progress Notes (Signed)
Occupational Therapy Session Note  Patient Details  Name: Kimberly [name] MRN: [id number] Date of Birth: September 25, 1956  Today's Date: 08/31/2014 OT Individual Time: 1001-1101 and 1330-1430 OT Individual Time Calculation (min): 60 min and 60 min   Short Term Goals: Week 1:  OT Short Term Goal 1 (Week 1): Pt will perform bathing with min A in order to increase I in self care. OT Short Term Goal 2 (Week 1): Pt will perform toileting with min A in order to increase I in self care. OT Short Term Goal 3 (Week 1): Pt will perform toilet transfer with min A in order to increase I in functional transfers. OT Short Term Goal 4 (Week 1): Pt will perform shower transfer with min A in order to increase I in functional transfers. OT Short Term Goal 5 (Week 1): Pt will tolerate 15 minutes of ADL session without requiring rest break in order to increase endurance.   Skilled Therapeutic Interventions/Progress Updates:  Session 1: Pt seated in wheelchair awaiting therapist arrival with 7/10 c/o pain in lower back. Pt reports pain is constant even with medication. Pt reports feeling very fatigued from previous session and declines shower but agrees to bathing at sink side. OT session with focus on STS, dynamic standing balance during functional tasks, ADL retraining, and pt education. Pt performed 6 STS with use of RW and Min A each time. Pt requiring verbal cues for proper technique and hand placement. While standing pt required Min -Mod A for dynamic standing balance during LB bathing and clothing management. Pt continues to be fearful when standing and needs very cues for reassurance and encouragement. Pt seated in wheelchair with call bell and all needed items within reach.   Session 2: Pt seated in wheelchair with 8/10 c/o pain in lower back with reports of taking pain medication prior to coming to room. OT session with community mobility, B UE strengthening, endurance, and AE education. Pt propelled wheelchair  ~250 feet with increased time and rest breaks secondary to fatigue. Pt propelled w/c navigating tight spaces and through tables and chairs with increased time. Pt navigated wheelchair on side walk of varied surfaces and terrains with min A (slight heel and threshold to enter doorway). OT educated pt on use of long handled reacher and sock aide for LB dressing with pt returning demonstration with min A for proper technique. Pt seated in wheelchair with call bell and all needed items upon exiting the room.   Therapy Documentation Precautions:  Precautions Precautions: Fall;Back Precaution Booklet Issued: Yes (comment) Precaution Comments: RLE buckling with WB Restrictions Weight Bearing Restrictions: No General:   Vital Signs: Oxygen Therapy O2 Device: None (Room air) Pain: Pain Assessment Pain Assessment: 0-10 Pain Score: 8  Pain Type: Surgical pain Pain Location: Back Pain Orientation: Lower Pain Descriptors / Indicators: Aching Pain Frequency: Constant Pain Onset: With Activity Patients Stated Pain Goal: 2 Pain Intervention(s): Medication (See eMAR)  See FIM for current functional status  Therapy/Group: Individual Therapy  Lowella Gripittman, [name] 08/31/2014, 12:54 PM

## 2014-08-31 NOTE — Progress Notes (Signed)
Physical Therapy Session Note  Patient Details  Name: Audree CamelRebecca P Hulce MRN: [id number] Date of Birth: Dec 23, 1955  Today's Date: 08/31/2014 PT Individual Time: 0800-0900 PT Individual Time Calculation (min): 60 min   Short Term Goals: Week 1:  PT Short Term Goal 1 (Week 1): Pt will perform all aspects of bed mobility at S level, maintianing back precautions PT Short Term Goal 2 (Week 1): Pt will perform sit<>stand at min A level consistently PT Short Term Goal 3 (Week 1): Pt will self propel w/c x 100' with BUEs at S level  PT Short Term Goal 4 (Week 1): Pt will ambulate x 15' with RW at mod A (+2 for chair follow)  Skilled Therapeutic Interventions/Progress Updates:   Pt received sitting on EOB, agreeable to therapy session, but wanting to use restroom prior to leaving room.  Transferred to w/c via squat pivot transfer at min A level with cues for foot placement and hand placement.  Assisted into restroom in w/c and performed stand pivot to toilet using grab bars at min A level with assist at R knee to prevent buckle.  Pt successful in voiding and performing peri care in seated (via lateral leans) and adjusted clothing prior to toileting.  Did require some assist to adjust clothing following due to back precautions.  Transferred back to w/c and pt self propelled to/from therapy gym using BUEs to increase overall strengthening and endurance.  Performed 2 bouts of gait x 30' x 1 and another 2770' x 1 with RW at min A level (+2 for close chair follow for safety).  Continues to demonstrate improved stride length, however still requires assist at R knee for safety (no noted buckling).  Transferred w/c to/from nustep with RW at min A level with RW with therapist blocking R knee for safety.  Performed seated nustep x 8 mins at level 3 resistance with BLEs only for increased strengthening and endurance.  Ended session with 5 reps of sit<>stand from w/c and no AD to work on increased weight bearing and  strengthening through RLE.  Performed at min/mod A level with cues for increased weight shift to the R.  Assisted back to room and left in w/c.  All needs in reach.    Therapy Documentation Precautions:  Precautions Precautions: Fall;Back Precaution Booklet Issued: Yes (comment) Precaution Comments: RLE buckling with WB Restrictions Weight Bearing Restrictions: No   Vital Signs: Therapy Vitals Temp: 98 F (36.7 C) Temp Source: Oral Pulse Rate: 53 Resp: 17 BP: 108/55 mmHg Oxygen Therapy SpO2: 96 % O2 Device: Bi-PAP Pain: Pain Assessment Pain Score: 5   See FIM for current functional status  Therapy/Group: Individual Therapy  Vista Deckarcell, Emerly Prak Ann 08/31/2014, 8:41 AM

## 2014-09-01 ENCOUNTER — Encounter (HOSPITAL_COMMUNITY): Payer: Medicare Other | Admitting: Occupational Therapy

## 2014-09-01 DIAGNOSIS — M25571 Pain in right ankle and joints of right foot: Secondary | ICD-10-CM

## 2014-09-01 LAB — BASIC METABOLIC PANEL
ANION GAP: 9 (ref 5–15)
BUN: 12 mg/dL (ref 6–23)
CHLORIDE: 101 meq/L (ref 96–112)
CO2: 27 mEq/L (ref 19–32)
Calcium: 8.9 mg/dL (ref 8.4–10.5)
Creatinine, Ser: 0.76 mg/dL (ref 0.50–1.10)
GFR calc non Af Amer: >90 mL/min (ref >90)
Glucose, Bld: 200 mg/dL — ABNORMAL HIGH (ref 70–99)
POTASSIUM: 4.1 meq/L (ref 3.7–5.3)
Sodium: 137 mEq/L (ref 137–147)

## 2014-09-01 LAB — GLUCOSE, CAPILLARY
GLUCOSE-CAPILLARY: 98 mg/dL (ref 70–99)
Glucose-Capillary: 122 mg/dL — ABNORMAL HIGH (ref 70–99)
Glucose-Capillary: 141 mg/dL — ABNORMAL HIGH (ref 70–99)
Glucose-Capillary: 170 mg/dL — ABNORMAL HIGH (ref 70–99)

## 2014-09-01 LAB — URIC ACID: URIC ACID, SERUM: 4.2 mg/dL (ref 2.4–7.0)

## 2014-09-01 MED ORDER — INSULIN ASPART 100 UNIT/ML ~~LOC~~ SOLN
12.0000 [IU] | Freq: Once | SUBCUTANEOUS | Status: AC
  Administered 2014-09-01: 12 [IU] via SUBCUTANEOUS

## 2014-09-01 MED ORDER — COLCHICINE 0.6 MG PO TABS
0.6000 mg | ORAL_TABLET | Freq: Two times a day (BID) | ORAL | Status: AC
  Administered 2014-09-01 – 2014-09-04 (×8): 0.6 mg via ORAL
  Filled 2014-09-01 (×8): qty 1

## 2014-09-01 NOTE — Progress Notes (Signed)

## 2014-09-01 NOTE — Progress Notes (Signed)
Occupational Therapy Session Note  Patient Details  Name: Kimberly [name] MRN: [id number] Date of Birth: March 10, 1956  Today's Date: 09/01/2014 OT Individual Time: 0800-0900 OT Individual Time Calculation (min): 60 min    Short Term Goals: Week 1:  OT Short Term Goal 1 (Week 1): Pt will perform bathing with min A in order to increase I in self care. OT Short Term Goal 2 (Week 1): Pt will perform toileting with min A in order to increase I in self care. OT Short Term Goal 3 (Week 1): Pt will perform toilet transfer with min A in order to increase I in functional transfers. OT Short Term Goal 4 (Week 1): Pt will perform shower transfer with min A in order to increase I in functional transfers. OT Short Term Goal 5 (Week 1): Pt will tolerate 15 minutes of ADL session without requiring rest break in order to increase endurance.   Skilled Therapeutic Interventions/Progress Updates:    1:1 self care retraining at shower level - focus on functional stand pivot transfers with RW, short distance functional ambulation with RW (from toilet to shower) with min A with extra time, activity tolerance, standing balance and tolerance, recall and ability to incorporate back precautions in functional tasks. In shower pt required cuing to remember to use long handled sponge for LB. Pt able to wash front peri area in sitting with cut out of shower seat but required A to wash buttocks in standing to maintain precautions. Issued elastic shoe laces for increase independence with donning shoes. Feet swollen today requiring A to pull them up in the back.  Also practiced furniture transfer to plush couch in family room simulating the one at home.  Pt required min A - but reported using great effort. Left in w/c next to bed.  Therapy Documentation Precautions:  Precautions Precautions: Fall;Back Precaution Booklet Issued: Yes (comment) Precaution Comments: RLE buckling with WB Restrictions Weight Bearing  Restrictions: No General:   Vital Signs: Therapy Vitals Temp: 98 F (36.7 C) Temp Source: Oral Pulse Rate: 59 Resp: 18 BP: 97/52 mmHg Oxygen Therapy SpO2: 97 % O2 Device: Bi-PAP Pain:  8/10-  RN aware- provided rest breaks  See FIM for current functional status  Therapy/Group: Individual Therapy  Roney MansSmith, Novie Maggio [facility] 09/01/2014, 8:33 AM

## 2014-09-01 NOTE — Progress Notes (Signed)
Hypoglycemic Event  CBG: 64  Treatment: 15 GM carbohydrate snack  Symptoms: None  Follow-up CBG: Time:2130 CBG Result:157  Possible Reasons for Event: Unknown  Comments/MD notified:treated per protocol    Kimberly MartinezMurray, [name]  Remember to initiate Hypoglycemia Order Set & complete

## 2014-09-01 NOTE — Progress Notes (Signed)
Hypoglycemic Event  CBG: 53  Treatment: 15 GM carbohydrate snack  Symptoms: None  Follow-up CBG: Time:2101 CBG Result:64  Possible Reasons for Event: Unknown  Comments/MD notified: treated per protocol    Alfredo MartinezMurray, Pebble Botkin A  Remember to initiate Hypoglycemia Order Set & complete

## 2014-09-01 NOTE — Progress Notes (Signed)
Kimberly [name] is a 58 y.o. female 10/31/1956 [id number]  Subjective: C/o severe pain in R foot x 1 day 9/10. Feeling OK otherwise. LBP - post-op...  Objective: Vital signs in last 24 hours: Temp:  [97.5 F (36.4 C)-98 F (36.7 C)] 98 F (36.7 C) (10/11 0545) Pulse Rate:  [59-61] 59 (10/11 0545) Resp:  [18] 18 (10/11 0545) BP: (97-111)/(52-60) 97/52 mmHg (10/11 0545) SpO2:  [97 %-98 %] 97 % (10/11 0545) Weight change:  Last BM Date: 08/31/14  Intake/Output from previous day: 10/10 0701 - 10/11 0700 In: 1300 [P.O.:1300] Out: -  Last cbgs: CBG (last 3)   Recent Labs  08/31/14 2101 08/31/14 2130 09/01/14 0625  GLUCAP 64* 157* 122*     Physical Exam General: No apparent distress   HEENT: not dry Lungs: Normal effort. Lungs clear to auscultation, no crackles or wheezes. Cardiovascular: Regular rate and rhythm, no edema Abdomen: S/NT/ND; BS(+) Musculoskeletal:  R mid-foot is very tender, mild swelling is present Neurological: No new neurological deficits Wounds: clear   Skin: clear  Aging changes Mental state: Alert, oriented, cooperative    Lab Results: BMET    Component Value Date/Time   NA 140 08/29/2014 0435   K 4.4 08/29/2014 0435   CL 103 08/29/2014 0435   CO2 30 08/29/2014 0435   GLUCOSE 159* 08/29/2014 0435   BUN 9 08/29/2014 0435   CREATININE 0.72 08/29/2014 0435   CALCIUM 8.8 08/29/2014 0435   GFRNONAA >90 08/29/2014 0435   GFRAA >90 08/29/2014 0435   CBC    Component Value Date/Time   WBC 4.7 08/29/2014 0435   RBC 3.01* 08/29/2014 0435   HGB 9.7* 08/29/2014 0435   HCT 28.6* 08/29/2014 0435   PLT 158 08/29/2014 0435   MCV 95.0 08/29/2014 0435   MCH 32.2 08/29/2014 0435   MCHC 33.9 08/29/2014 0435   RDW 15.0 08/29/2014 0435   LYMPHSABS 1.8 08/29/2014 0435   MONOABS 0.4 08/29/2014 0435   EOSABS 0.4 08/29/2014 0435   BASOSABS 0.0 08/29/2014 0435    Studies/Results: No results found.  Medications: I have reviewed the patient's current  medications.  Assessment/Plan:  1. Functional deficits secondary to lumbar stenosis with neurogenic claudication  2. DVT Prophylaxis/Anticoagulation: Pharmaceutical: Lovenox  3. Pain Management: Continue neurontin for neuropathy and trial ultram for pain management. Flexeril effective for spasms.  4. Mood: Mood stable and appears brighter today. LCSW to follow for evaluation and support.  5. Neuropsych: This patient is capable of making decisions on her own behalf.  6. Skin/Wound Care: Pressure relief measures--turn 2-3 hours at night to help with pressure and pain relief. . Monitor hydration and nutritional status additionally.  7. Fluids/Electrolytes/Nutrition: Monitor I/O. Check labs in am.  8. DM type 2 with diabetic neuropathy: Monitor blood sugars ac/hs. Patient has had low BS and has refused lantus for past 24 hours--encourage use of lantus 25 units am and 40 units in pm. Off Victoza at this time. Will decrease meal coverage to prevent hypoglycemic episodes.  9. HTN: Monitor BP every 8 hours. Continue lisinopril, coreg and Norvasc.  10 Non-obstructive CAD: Resume ASA. Continue Coreg, Lipitor and Lisinopril  11. OSA: Continue CPAP with oxygen bled in.  12. Pulmonary HTN/ Asthma: Will encourage IS. Advair bid with duonebs prn SOB/wheezing.  13. ABLA: Improved past transfusion. Will check follow up labs in am.  14. . Hypokalemia: Likely dilutional from IVF. Will discontinue and encourage po intake. Check labs in am.  15. Dysuria: She reports that she  completed doxycyline PTA. Will discontinue and check UA/UCS due to complaints of dysuria.  16. R foot pain - likely gout. She has a strong family h/o gout. Labs. Empiric colchicine.   Length of stay, days: 4  Sonda PrimesAlex Ogden Handlin , MD 09/01/2014, 9:03 AM

## 2014-09-01 NOTE — Progress Notes (Signed)
Complains of pain to lower back and numbness/tingling to RLE.  Flexeril and ultram given at 0020 with relief. Rating pain a 7 or 8. PRN tylenol given at 2012 & 0337.Alfredo MartinezMurray, Lacorey Brusca A

## 2014-09-02 ENCOUNTER — Inpatient Hospital Stay (HOSPITAL_COMMUNITY): Payer: Medicare Other | Admitting: Rehabilitation

## 2014-09-02 ENCOUNTER — Ambulatory Visit: Payer: Medicare Other | Admitting: Internal Medicine

## 2014-09-02 ENCOUNTER — Encounter (HOSPITAL_COMMUNITY): Payer: Medicare Other

## 2014-09-02 ENCOUNTER — Inpatient Hospital Stay (HOSPITAL_COMMUNITY): Payer: Medicare Other | Admitting: Occupational Therapy

## 2014-09-02 LAB — GLUCOSE, CAPILLARY
GLUCOSE-CAPILLARY: 142 mg/dL — AB (ref 70–99)
Glucose-Capillary: 81 mg/dL (ref 70–99)
Glucose-Capillary: 92 mg/dL (ref 70–99)
Glucose-Capillary: 140 mg/dL — ABNORMAL HIGH (ref 70–99)

## 2014-09-02 MED ORDER — GABAPENTIN 100 MG PO CAPS
200.0000 mg | ORAL_CAPSULE | Freq: Every evening | ORAL | Status: DC
  Administered 2014-09-02: 200 mg via ORAL
  Filled 2014-09-02 (×2): qty 2

## 2014-09-02 MED ORDER — INSULIN ASPART 100 UNIT/ML ~~LOC~~ SOLN
13.0000 [IU] | Freq: Once | SUBCUTANEOUS | Status: AC
  Administered 2014-09-02: 13 [IU] via SUBCUTANEOUS

## 2014-09-02 NOTE — Progress Notes (Signed)
Occupational Therapy Session Note  Patient Details  Name: Kimberly [name] MRN: [id number] Date of Birth: August 06, 1956  Today's Date: 09/02/2014 OT Individual Time: 0900-1000 OT Individual Time Calculation (min): 60 min   Short Term Goals: Week 1:  OT Short Term Goal 1 (Week 1): Pt will perform bathing with min A in order to increase I in self care. OT Short Term Goal 2 (Week 1): Pt will perform toileting with min A in order to increase I in self care. OT Short Term Goal 3 (Week 1): Pt will perform toilet transfer with min A in order to increase I in functional transfers. OT Short Term Goal 4 (Week 1): Pt will perform shower transfer with min A in order to increase I in functional transfers. OT Short Term Goal 5 (Week 1): Pt will tolerate 15 minutes of ADL session without requiring rest break in order to increase endurance.   Skilled Therapeutic Interventions/Progress Updates:  Patient resting in w/c upon arrival.  Engaged in self care retraining to include sponge bath (declined shower today), dress and groom tasks.  Focused session on activity tolerance, sit><stand, standing tolerance and balance, recall and adherence to back precautions, use of LH sponge, reacher, sock aid and LH shoe horn.  Patient with no LOB during standing at sink and was close supervision.  Patient required vcs for use of AE.  Therapy Documentation Precautions:  Precautions Precautions: Fall;Back Precaution Booklet Issued: Yes (comment) Precaution Comments: RLE buckling with WB Restrictions Weight Bearing Restrictions: No Pain: 6/10 lower back, premedicated, rest, repositioned. ADL: See FIM for current functional status  Therapy/Group: Individual Therapy  [name] 09/02/2014, 11:24 AM

## 2014-09-02 NOTE — Consult Note (Signed)
INITIAL DIAGNOSTIC EVALUATION - [facility]   MEDICAL NECESSITY:  Kim[name] for an initial diagnostic evaluation owing to the patient's diagnosis of lumbar stenosis with neurogenic claudication.   According to medical records, 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 Patient elected to undergo L2-3 decompressive lam with repair of complex dural laceration on 08/23/14 by Dr. Jordan LikesPool. Post op on bedrest till 10/05 and patient with reports of RLE numbness and heaviness."   During today's visit, [name] reported suffering from mild memory disturbance and decreased attention/concentration post-stroke circa January 2015. She has no memory for the stroke event. Since then, she can no longer perform mental calculations and she described her speed of thinking as slow. She believes that her thinking abilities (in general) are less efficient. Overall, patient feels that these issued have improved over time.   From an emotional standpoint, [name] has a history of "severe depression" that led to passive suicidal ideation and eventual counseling and antidepressant treatment. She did not find either treatment to be effective. However, she said she is no longer depressed or treated for depression in any way. She purportedly has a new appreciation for life, particularly since her brother recently passed following a battle with cancer. No adjustment issues endorsed. Current suicidal/homicidal ideation, plan or intent was denied. No manic or hypomanic episodes were reported. The patient denied ever experiencing any auditory/visual hallucinations. No major behavioral  or personality changes were endorsed.   [name] feels that she is progressing in therapy. She described the staff as "pretty good" but she has had some issues with certain aids. She reported that certain aids do not assist her as much as she feels is necessary. They reportedly believe that she is more independent than she is capable. [name] experiences significant fear about falling as she has fallen several times in the past. She worries that this might happen again. No barriers to therapy identified. She has a good social support system.   PROCEDURES ADMINISTERED: [1 unit [id number] on 09/02/14] Diagnostic clinical interview  Review of available records  Behavioral Evaluation: [name] was appropriately dressed for season and situation, and she appeared tidy and well-groomed. Normal posture was noted. She was friendly and rapport was easily established. Her speech was as expected and she was able to express ideas effectively. Her affect was appropriately modulated. Attention and motivation were good.    SUMMARY & IMPRESSION: Overall, [name] reported suffering from improving cognitive issues post-stroke circa January of this year. I recommended she undergo neuropsychological evaluation approximately 2-3 months post discharge to assess cognitive strengths and challenges. She said she would consider it. Regarding her admission, no adjustment issues noted or any major signs of clinical psychopathology. However, given her concern about certain staff aids, it may be beneficial to re-educate her aids (particularly weekend staff) so that she feels better cared for and to reduce the possibility of any adverse events occurring. This was discussed with her Child psychotherapistsocial worker and she plans to look into this situation.    RECOMMENDATIONS    Patient social worker is to follow-up about staff aids. I would like to check in with the patient next week if no other patients take  precedence. Please place her  on my schedule for next Monday.    Please provide her with information on a local neuropsychologist so that she can schedule an appointment post-discharge. I am happy to see her in this regard.   DIAGNOSES:  Lumbar stenosis with neurogenic claudication Cognitive disorder, NOS   Debbe MountsAdam T. Tracina Beaumont, Psy.D.  Clinical Neuropsychologist  Rehabilitation psychologist

## 2014-09-02 NOTE — Plan of Care (Signed)
Problem: RH PAIN MANAGEMENT Goal: RH STG PAIN MANAGED AT OR BELOW PT'S PAIN GOAL Less than 3 on 0-10 scale  Outcome: Progressing PRN ultram, flexeril, and tylenol effective. Neurontin increased today for burning, tingling pain in RLE.

## 2014-09-02 NOTE — Progress Notes (Signed)
Physical Therapy Session Note  Patient Details  Name: Kimberly [name] MRN: [id number] Date of Birth: Apr 12, 1956  Today's Date: 09/02/2014 PT Individual Time: 1000-1100 PT Individual Time Calculation (min): 60 min   Short Term Goals: Week 1:  PT Short Term Goal 1 (Week 1): Pt will perform all aspects of bed mobility at S level, maintianing back precautions PT Short Term Goal 2 (Week 1): Pt will perform sit<>stand at min A level consistently PT Short Term Goal 3 (Week 1): Pt will self propel w/c x 100' with BUEs at S level  PT Short Term Goal 4 (Week 1): Pt will ambulate x 15' with RW at mod A (+2 for chair follow)  Skilled Therapeutic Interventions/Progress Updates:   Pt received sitting in w/c in room.  Pt concerned with her care over weekend and discussing issues she had with staff.  Charge nurse notified following session.  RT joined session to work on gait training and standing balance.  Pt self propelled to/from therapy gym >150' at S level.  Pt states her chair feels like its harder to propel, therefore once in hallway outside of therapy gym, PT switched chair to 20x16 with improved comfort and pt stating it felt better to propel.  Performed gait x 25' x 30' x 1 and another 7540' x 1 with min A (+2 for chair follow for safety).  Pt continues to require min cues for upright posture and breathing, but did much better with stride lengths and steering RW back to the L today.  Again, did not note any R knee buckling, but therapist provided light assist at knee for safety.  Pt discussed with RT activities that she enjoyed doing at home, cooking, and playing horse shoes.  Discussed that PT/RT would like to take pt on outing in order to get her back into community and then come back and cook a meal in kitchen.  Pt verbalized agreement and seemed to enjoy the idea.  Ended session with standing balance/tolerance activity while tossing horseshoes in standing with RW.  Provided min A for increased weight  shift to the R when reaching for horse shoes.  Also had to her perform mini squats several times to get horse shoes.  Pt performed very well with good control of knee.  Pt self propelled back to room as stated above.  All needs in reach.  Discussed that she could propel herself around unit if she would like to for increase independence.  Pt verbalized understanding.   Therapy Documentation Precautions:  Precautions Precautions: Fall;Back Precaution Booklet Issued: Yes (comment) Precaution Comments: RLE buckling with WB Restrictions Weight Bearing Restrictions: No   Vital Signs: Oxygen Therapy O2 Device: None (Room air) Pain: Pain Assessment Pain Assessment: 0-10 Pain Score: 8  Pain Type: Surgical pain Pain Location: Back Pain Orientation: Lower Pain Descriptors / Indicators: Aching Pain Frequency: Intermittent Pain Onset: On-going Pain Intervention(s): Medication (See eMAR) Multiple Pain Sites: No  See FIM for current functional status  Therapy/Group: Individual Therapy  Vista Deckarcell, Cathleen Yagi Ann 09/02/2014, 8:57 AM

## 2014-09-02 NOTE — Plan of Care (Signed)
Problem: RH SKIN INTEGRITY Goal: RH STG ABLE TO PERFORM INCISION/WOUND CARE W/ASSISTANCE STG Able To Perform Incision/Wound Care With Total Assistance.  Outcome: Progressing Dressing changed by staff due to location on lower back.

## 2014-09-02 NOTE — Progress Notes (Signed)
Subjective/Complaints: 58 y.o. 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 Patient elected to undergo L2-3 decompressive lam with repair of complex dural laceration on 08/23/14 by Dr. Annette Stable. Post op on bedrest till 10/05 and patient with reports of RLE numbness and heaviness. Patient continues to be limited by RLE instability requiring blocking to prevent buckling with transfer.    Appreciate NS note Still with numbness and weakness RLE  Review of Systems - Negative except burning pain R>L thigh Objective: Vital Signs: Blood pressure 130/66, pulse 61, temperature 98.2 F (36.8 C), temperature source Oral, resp. rate 18, height 5' (1.524 m), weight 112.3 kg (247 lb 9.2 oz), SpO2 95.00%. No results found. Results for orders placed during the hospital encounter of 08/28/14 (from the past 72 hour(s))  GLUCOSE, CAPILLARY     Status: Abnormal   Collection Time    08/30/14 11:17 AM      Result Value Ref Range   Glucose-Capillary 142 (*) 70 - 99 mg/dL  GLUCOSE, CAPILLARY     Status: Abnormal   Collection Time    08/30/14  4:36 PM      Result Value Ref Range   Glucose-Capillary 107 (*) 70 - 99 mg/dL  GLUCOSE, CAPILLARY     Status: Abnormal   Collection Time    08/30/14  8:48 PM      Result Value Ref Range   Glucose-Capillary 127 (*) 70 - 99 mg/dL   Comment 1 Notify RN    GLUCOSE, CAPILLARY     Status: None   Collection Time    08/31/14  6:24 AM      Result Value Ref Range   Glucose-Capillary 96  70 - 99 mg/dL   Comment 1 Notify RN    GLUCOSE, CAPILLARY     Status: Abnormal   Collection Time    08/31/14 11:36 AM      Result Value Ref Range   Glucose-Capillary 121 (*) 70 - 99 mg/dL  GLUCOSE, CAPILLARY     Status: Abnormal   Collection Time    08/31/14  4:34 PM      Result Value Ref Range   Glucose-Capillary 165 (*) 70 - 99 mg/dL  GLUCOSE, CAPILLARY     Status: Abnormal   Collection Time    08/31/14  8:43 PM      Result Value Ref Range   Glucose-Capillary 53 (*) 70 - 99 mg/dL   Comment 1 Notify RN    GLUCOSE, CAPILLARY     Status: Abnormal   Collection Time    08/31/14  9:01 PM      Result Value Ref Range   Glucose-Capillary 64 (*) 70 - 99 mg/dL   Comment 1 Notify RN    GLUCOSE, CAPILLARY     Status: Abnormal   Collection Time    08/31/14  9:30 PM      Result Value Ref Range   Glucose-Capillary 157 (*) 70 - 99 mg/dL   Comment 1 Notify RN    GLUCOSE, CAPILLARY     Status: Abnormal   Collection Time    09/01/14  6:25 AM      Result Value Ref Range   Glucose-Capillary 122 (*) 70 - 99 mg/dL   Comment 1 Notify RN    URIC ACID     Status: None   Collection Time    09/01/14  9:50 AM  Result Value Ref Range   Uric Acid, Serum 4.2  2.4 - 7.0 mg/dL  BASIC METABOLIC PANEL     Status: Abnormal   Collection Time    09/01/14  9:50 AM      Result Value Ref Range   Sodium 137  137 - 147 mEq/L   Potassium 4.1  3.7 - 5.3 mEq/L   Chloride 101  96 - 112 mEq/L   CO2 27  19 - 32 mEq/L   Glucose, Bld 200 (*) 70 - 99 mg/dL   BUN 12  6 - 23 mg/dL   Creatinine, Ser 0.76  0.50 - 1.10 mg/dL   Calcium 8.9  8.4 - 10.5 mg/dL   GFR calc non Af Amer >90  >90 mL/min   GFR calc Af Amer >90  >90 mL/min   Comment: (NOTE)     The eGFR has been calculated using the CKD EPI equation.     This calculation has not been validated in all clinical situations.     eGFR's persistently <90 mL/min signify possible Chronic Kidney     Disease.   Anion gap 9  5 - 15  GLUCOSE, CAPILLARY     Status: Abnormal   Collection Time    09/01/14 11:35 AM      Result Value Ref Range   Glucose-Capillary 141 (*) 70 - 99 mg/dL   Comment 1 Notify RN    GLUCOSE, CAPILLARY     Status: None   Collection Time    09/01/14  4:33 PM      Result Value Ref Range   Glucose-Capillary 98  70 - 99 mg/dL   Comment 1 Notify RN    GLUCOSE, CAPILLARY     Status: Abnormal   Collection Time    09/01/14   9:40 PM      Result Value Ref Range   Glucose-Capillary 170 (*) 70 - 99 mg/dL  GLUCOSE, CAPILLARY     Status: None   Collection Time    09/02/14  7:03 AM      Result Value Ref Range   Glucose-Capillary 81  70 - 99 mg/dL     HEENT: normal Cardio: RRR and no murmur Resp: CTA B/L and unlabored GI: BS positive and NT,ND Extremity:  Pulses positive and No Edema Skin:  No breakdown Right thigh  leg or ankle Neuro: Alert/Oriented, Anxious and Abnormal Sensory decreased R> L ant thigh, leg and foot Musc/Skel:  Normal Gen NAD   Assessment/Plan: 1. Functional deficits secondary to Lumbar stenosis with neurogenic claudication, s/p decompression and fusion which require 3+ hours per day of interdisciplinary therapy in a comprehensive inpatient rehab setting. Physiatrist is providing close team supervision and 24 hour management of active medical problems listed below. Physiatrist and rehab team continue to assess barriers to discharge/monitor patient progress toward functional and medical goals. FIM: FIM - Bathing Bathing Steps Patient Completed: Chest;Right Arm;Left Arm;Abdomen;Right upper leg;Left upper leg;Right lower leg (including foot);Left lower leg (including foot);Front perineal area Bathing: 4: Min-Patient completes 8-9 14f10 parts or 75+ percent   FIM - Upper Body Dressing/Undressing Upper body dressing/undressing steps patient completed: Thread/unthread right sleeve of pullover shirt/dresss;Thread/unthread left sleeve of pullover shirt/dress;Put head through opening of pull over shirt/dress;Pull shirt over trunk Upper body dressing/undressing: 5: Set-up assist to: Obtain clothing/put away FIM - Lower Body Dressing/Undressing Lower body dressing/undressing steps patient completed: Pull underwear up/down;Thread/unthread left underwear leg;Thread/unthread right underwear leg (wore one of her gowns ) Lower body dressing/undressing: 2: Max-Patient completed  25-49% of tasks  FIM -  Toileting Toileting steps completed by patient: Adjust clothing prior to toileting;Performs perineal hygiene;Adjust clothing after toileting Toileting Assistive Devices: Grab bar or rail for support Toileting: 4: Steadying assist  FIM - Radio producer Devices: Grab bars Toilet Transfers: 4-To toilet/BSC: Min A (steadying Pt. > 75%);4-From toilet/BSC: Min A (steadying Pt. > 75%)  FIM - Bed/Chair Transfer Bed/Chair Transfer Assistive Devices: Bed rails;Arm rests Bed/Chair Transfer: 4: Bed > Chair or W/C: Min A (steadying Pt. > 75%);4: Chair or W/C > Bed: Min A (steadying Pt. > 75%)  FIM - Locomotion: Wheelchair Distance: 100 Locomotion: Wheelchair: 2: Travels 50 - 149 ft with supervision, cueing or coaxing FIM - Locomotion: Ambulation Locomotion: Ambulation Assistive Devices: Administrator Ambulation/Gait Assistance: 4: Min assist;1: +2 Total assist Locomotion: Ambulation: 1: Two helpers  Comprehension Comprehension Mode: Auditory Comprehension: 5-Follows basic conversation/direction: With no assist  Expression Expression Mode: Verbal Expression: 5-Expresses basic needs/ideas: With extra time/assistive device  Social Interaction Social Interaction: 6-Interacts appropriately with others with medication or extra time (anti-anxiety, antidepressant).  Problem Solving Problem Solving: 6-Solves complex problems: With extra time  Memory Memory: 7-Complete Independence: No helper  Medical Problem List and Plan:  1. Functional deficits secondary to lumbar stenosis with neurogenic claudication  2. DVT Prophylaxis/Anticoagulation: Pharmaceutical: Lovenox  3. Pain Management: Continue neurontin for neuropathy and trial ultram for pain management. Flexeril effective for spasms. Right foot pain likely due to nerve root lesion, urate neg,adjust meds 4. Mood: Mood stable and appears brighter today. LCSW to follow for evaluation and support.  5. Neuropsych:  This patient is capable of making decisions on her own behalf.  6. Skin/Wound Care: Pressure relief measures--turn 2-3 hours at night to help with pressure and pain relief. . Monitor hydration and nutritional status additionally.  7. Fluids/Electrolytes/Nutrition: Monitor I/O.   8. DM type 2 with diabetic neuropathy: Monitor blood sugars ac/hs. Patient has had low BS and has refused lantus for past 24 hours--encourage use of lantus 25 units am and 40 units in pm. Off Victoza at this time. Will decrease meal coverage to prevent hypoglycemic episodes.  9. HTN: Monitor BP every 8 hours. Continue lisinopril, coreg and Norvasc.  10 Non-obstructive CAD: Resume ASA. Continue Coreg, Lipitor and Lisinopril  11. OSA: Continue CPAP with oxygen bled in.  12. Pulmonary HTN/ Asthma: Will encourage IS. Advair bid with duonebs prn SOB/wheezing.  13. ABLA: Improved past transfusion. Will check follow up labs in am.  14. . Hypokalemia: Likely dilutional from IVF. Will discontinue and encourage po intake. Check labs in am.  15. Dysuria: She reports that she completed doxycyline PTA. Will discontinue and check UA/UCS due to complaints of dysuria.    LOS (Days) 5 A FACE TO FACE EVALUATION WAS PERFORMED  Athen Riel E 09/02/2014, 7:45 AM

## 2014-09-02 NOTE — Progress Notes (Signed)
Recreational Therapy Assessment and Plan  Patient Details  Name: Kimberly [name] MRN: [id number] Date of Birth: Apr 22, 1956 Today's Date: 09/02/2014  Rehab Potential: Good ELOS: 10 days   Assessment Clinical Impression:Problem List:  Patient Active Problem List    Diagnosis  Date Noted   .  Lumbar stenosis with neurogenic claudication  08/28/2014   .  Lumbosacral spondylosis without myelopathy  08/23/2014   .  Lumbosacral stenosis with neurogenic claudication  08/23/2014   .  Vaginal discharge  06/22/2014   .  Chest pain  06/21/2014   .  Spinal stenosis  08/23/2011   .  Obstructive sleep apnea  08/23/2011   .  IDDM  08/12/2010   .  CAD  08/12/2010   .  CHRONIC DIASTOLIC HEART FAILURE  11/30/3233   .  HYPERLIPIDEMIA-MIXED  06/23/2009   .  OVERWEIGHT/OBESITY  06/23/2009   .  HYPERTENSION, UNSPECIFIED  06/23/2009   .  CHEST PAIN-UNSPECIFIED  06/23/2009    Past Medical History:  Past Medical History   Diagnosis  Date   .  CAD (coronary artery disease)      nonobstructive by cath, 6/08 (false positive Cardiolite) normal stress echo, 7/11   .  Heart failure, diastolic, chronic    .  GERD (gastroesophageal reflux disease)    .  Morbid obesity    .  Obstructive sleep apnea    .  IDDM (insulin dependent diabetes mellitus)    .  HTN (hypertension)    .  Shortness of breath    .  Asthma    .  Spinal stenosis    .  Chronic back pain    .  Arthritis    .  Degenerative joint disease    .  Glaucoma    .  CHF (congestive heart failure)    .  Complication of anesthesia      had an asthma attack when woke up from procedure   .  Stroke      12/09/2013   .  Pneumonia      as a child   .  Hypothyroidism    .  Depression    .  Hernia of abdominal cavity      "upper and lower hernia"   .  Seizures      PMH: only as a child   .  Heart murmur      PMH:As a child only   .  Headache(784.0)    .  Neuropathy      associated with diabetes   .  Anemia      PMH: as a child   .   Fatty liver    .  Pancreatitis    .  IBS (irritable bowel syndrome)    .  Rheumatic fever      PMH: as a child    Past Surgical History:  Past Surgical History   Procedure  Laterality  Date   .  Eye surgery   1967   .  Tubal ligation   1975   .  Partial hysterectomy   1981   .  Hemorrhoid surgery     .  Hernia repair     .  Spinahatomy   1992   .  Cholecystectomy   1994   .  Ovaries removed   2003   .  Heel (other)   2005   .  Abdominal hysterectomy     .  Bilateral knee arthroscopy   2000,  2009   .  Colonoscopy  N/A  08/01/2013     Procedure: COLONOSCOPY; Surgeon: Rogene Houston, MD; Location: [facility]; Service: Endoscopy; Laterality: N/A; 930   .  Cataract extraction w/ intraocular lens implant, bilateral     .  Back surgery     .  Appendectomy     .  Cyst excision       Left breast   .  Cardiac catheterization       2009   .  Lumbar laminectomy/decompression microdiscectomy  Right  08/23/2014     Procedure: LUMBAR LAMINECTOMY/DECOMPRESSION MICRODISCECTOMY 1 LEVEL; Surgeon: Charlie Pitter, MD; Location: [facility]; Service: Neurosurgery; Laterality: Right; LUMBAR LAMINECTOMY/DECOMPRESSION MICRODISCECTOMY 1 LEVEL LUMBAR 2-3    Assessment & Plan  Clinical Impression: Patient is a 58 y.o. year old female with history of HTN, DM type 2 with neuropathy, CVA 1/15, severe back and LE pain due to L2/3 spondylosis with severe stenosis and radiculopathy. Patient elected to undergo L2-3 decompressive lam with repair of complex dural laceration on 08/23/14 by Dr. Annette Stable. Post op on bedrest till 10/05 and patient with reports of RLE numbness and heaviness. OT evaluation done today and patient with RLE instability requiring blocking to prevent buckling with transfer. MD recommending CIR for progressive therapy. Patient transferred to CIR on 08/28/2014.   Pt presents with decreased activity tolerance, decreased functional mobility, decreased balance Limiting pt's independence with  leisure/community pursuits.   Leisure History/Participation Premorbid leisure interest/current participation: Crafts - Knitting/Crocheting;Crafts - Sewing;Community - Psychologist, forensic Other Leisure Interests: Television;Cooking/Baking Leisure Participation Style: With Family/Friends;Alone Awareness of Community Resources: Good-identify 3 post discharge leisure resources Psychosocial / Spiritual Stress Management: Good Patient agreeable to Pet Therapy: Yes Does patient have pets?: Yes Social interaction - Mood/Behavior: Cooperative Academic librarian Appropriate for Education?: Yes Patient Agreeable to Outing?: Yes Strengths/Weaknesses Patient Strengths/Abilities: Willingness to participate Patient weaknesses: Physical limitations;Minimal Premorbid Leisure Activity TR Patient demonstrates impairments in the following area(s): Endurance;Pain  Plan Rec Therapy Plan Is patient appropriate for Therapeutic Recreation?: Yes Rehab Potential: Good Treatment times per week: Min 1 time per week >20 mintues Estimated Length of Stay: 10 days TR Treatment/Interventions: Adaptive equipment instruction;1:1 session;Balance/vestibular training;Functional mobility training;Community reintegration;Patient/family education;Therapeutic activities;Recreation/leisure participation;Therapeutic exercise;UE/LE Coordination activities;Wheelchair propulsion/positioning  Recommendations for other services: None  Discharge Criteria: Patient will be discharged from TR if patient refuses treatment 3 consecutive times without medical reason.  If treatment goals not met, if there is a change in medical status, if patient makes no progress towards goals or if patient is discharged from hospital.  The above assessment, treatment plan, treatment alternatives and goals were discussed and mutually agreed upon: by patient  [place] 09/02/2014, 3:34 PM

## 2014-09-02 NOTE — Progress Notes (Signed)
Physical Therapy Session Note  Patient Details  Name: Kimberly [name] MRN: [id number] Date of Birth: [birth year]  Today's Date: 09/02/2014 PT Individual Time: [id number] PT Individual Time Calculation (min): 61 min   Short Term Goals: Week 1:  PT Short Term Goal 1 (Week 1): Pt will perform all aspects of bed mobility at S level, maintianing back precautions PT Short Term Goal 2 (Week 1): Pt will perform sit<>stand at min A level consistently PT Short Term Goal 3 (Week 1): Pt will self propel w/c x 100' with BUEs at S level  PT Short Term Goal 4 (Week 1): Pt will ambulate x 15' with RW at mod A (+2 for chair follow)  Skilled Therapeutic Interventions/Progress Updates:   Pt received lying in bed, agreeable to therapy session, but c/o pain in back and down R LE.  States 8/10 pain, however states that RN had provided meds prior to session.  Performed bed mobility with HOB flat and without rails at S level.  Transferred to w/c using RW at min A level.  Skilled session focused on gait training with RW for improved quality, increased independence and increased distance, stair negotiation to simulate home entry, seated therex and w/c propulsion for therex, and self stretching to calf/hamstring while in w/c and bed.  Performed 25' x 1 and 30' x 1, and another 6250' x 1 with RW at min A level (no chair follow this afternoon) with min cues for upright posture, locking brakes prior to standing, and decreasing speed of gait for safety.  Performed single step with use of RW to simulate home entry at min A level (+2 for safety in case of knee buckle).  No buckling noted with verbal and demonstration cues for sequencing and technique.  Pt tolerated very well and was pleased with being able to do step.  Performed seated nustep x 10 mins at level 3 resistance with LEs only to increase LE strength and overall endurance.  Tolerated well without rest break.  Pt with concerns on getting spasms in calf and hamstrings at  night, therefore educated on how to self stretch while seated in w/c and also how to stretch while in bed.  Provided pt with leg lifter to assist with stretching in bed and also to assist RLE.  Pt self propelled back to room and transferred to bed as stated above.  Educated on how to use leg lifter and not break back precautions.  Pt returned demonstration well and also educated on self stretch with leg lifter.  Pt left in bed with all needs in reach.    Therapy Documentation Precautions:  Precautions Precautions: Fall;Back Precaution Booklet Issued: Yes (comment) Precaution Comments: RLE buckling with WB Restrictions Weight Bearing Restrictions: No   Pain: Pain Assessment Pain Assessment: 0-10 Pain Score: 8 Pain Type: Surgical pain;Neuropathic pain Pain Location: Back Pain Orientation: Lower Pain Radiating Towards: RLE Pain Descriptors / Indicators: Aching;Tingling Pain Frequency: Intermittent Pain Onset: On-going Patients Stated Pain Goal: 2 Pain Intervention(s): RN had provided pain meds prior to session.    Locomotion : Ambulation Ambulation/Gait Assistance: 4: Min assist;1: +2 Total assist Wheelchair Mobility Distance: 150   See FIM for current functional status  Therapy/Group: Individual Therapy  Vista Deckarcell, Spike Desilets Ann 09/02/2014, 2:19 PM

## 2014-09-03 ENCOUNTER — Inpatient Hospital Stay (HOSPITAL_COMMUNITY): Payer: Medicare Other | Admitting: *Deleted

## 2014-09-03 ENCOUNTER — Inpatient Hospital Stay (HOSPITAL_COMMUNITY): Payer: Medicare Other | Admitting: Occupational Therapy

## 2014-09-03 ENCOUNTER — Inpatient Hospital Stay (HOSPITAL_COMMUNITY): Payer: Medicare Other | Admitting: Physical Therapy

## 2014-09-03 LAB — GLUCOSE, CAPILLARY
GLUCOSE-CAPILLARY: 92 mg/dL (ref 70–99)
GLUCOSE-CAPILLARY: 184 mg/dL — AB (ref 70–99)
Glucose-Capillary: 115 mg/dL — ABNORMAL HIGH (ref 70–99)
Glucose-Capillary: 46 mg/dL — ABNORMAL LOW (ref 70–99)
Glucose-Capillary: 85 mg/dL (ref 70–99)

## 2014-09-03 MED ORDER — GABAPENTIN 300 MG PO CAPS
300.0000 mg | ORAL_CAPSULE | Freq: Every evening | ORAL | Status: DC
  Administered 2014-09-03: 300 mg via ORAL
  Filled 2014-09-03 (×2): qty 1

## 2014-09-03 NOTE — Significant Event (Signed)
Hypoglycemic Event  CBG:46    Treatment: 15 GM carbohydrate snack  Symptoms: Sweaty and Nervous/irritable  Follow-up CBG: [id number] CBG Result:85  Possible Reasons for Event: Inadequate meal intake  Comments/MD notified:Pt request staff to check CBG because she felt like it was low.     Kimberly [name], Phill MutterMelissa Rekisha  Remember to initiate Hypoglycemia Order Set & complete

## 2014-09-03 NOTE — Progress Notes (Signed)
Occupational Therapy Session Notes  Patient Details  Name: Audree CamelRebecca P Diesel MRN: [id number] Date of Birth: May 15, 1956  Today's Date: 09/03/2014 OT Individual Time: 1010-1110 and 100-130 (60 minute co-tx with PT) OT Individual Time Calculation (min): 60 min and 30 min  Short Term Goals: Week 1:  OT Short Term Goal 1 (Week 1): Pt will perform bathing with min A in order to increase I in self care. OT Short Term Goal 2 (Week 1): Pt will perform toileting with min A in order to increase I in self care. OT Short Term Goal 3 (Week 1): Pt will perform toilet transfer with min A in order to increase I in functional transfers. OT Short Term Goal 4 (Week 1): Pt will perform shower transfer with min A in order to increase I in functional transfers. OT Short Term Goal 5 (Week 1): Pt will tolerate 15 minutes of ADL session without requiring rest break in order to increase endurance.   Skilled Therapeutic Interventions/Progress Updates:  1)  Patient resting in w/c upon arrival.  Engaged in self care retraining to include shower, dress and groom.  Focused session on activity tolerance, w/c mobility, safe shower and bed transfers, adhering to back precautions during self care tasks, adaptive techniques and use of AE to improve independence.  Patient able to bathe using LH sponge to wash feet, peri area and butocks, sit and stand with grab bar with close supervision except on one occasion, patient required max assist while standing to safely sit due to twisting left while standing and shifted weight onto RLE which began to give out on her.  Patient able to complete full 60 min self care session without need for rest breaks, min assist with w/c><tub bench in walk in shower using grab bar. Patient practiced LB dressing using reacher, sock aid and LH shower horn then stood at sink for grooming.  Issued toilet aid at end of session for patient to begin practicing with.  Assisted patient back to bed to rest her  back.  2) Patient resting in w/c upon arrival for co-treatment session with PT.  Engaged in UB strengthening, tub bench transfer, car transfer, side stepping and stair climbing.  Please refer to PT session note for details related to ambulation, car transfer and stair climbing.  Focused session on patient propelling w/c with BUEs forward and backwards to therapy bathroom with tub/shower combo then propelled forward at end of session to get back to her hospital room, practiced safe tub bench transfer without use of grab bars secondary to patient does not currently have them yet they are recommended by this OT.  [facility] she [facility] and use of shower curtain to keep water off the floor.  Recommend that patient perform shower in the therapy bathroom prior to discharge.  Patient also min assist for side stepping to left and right in case her walker will not fit in the bathroom traveling forward.  Patient able to recall 2/3 back precautions therefore precautions were reviewed to include occasions during functional mobility with BADL & IADL when she might break her precautions.  Patient with occasional SOB with activity and required vcs to perform pursed lip breathing.  Therapy Documentation Precautions:  Precautions Precautions: Fall;Back Precaution Booklet Issued: Yes (comment) Precaution Comments: RLE buckling with WB Restrictions Weight Bearing Restrictions: No Pain: 1)  8/10 low back and hips, repositioned, rest  & to bed after session, RN aware and breakthrough medication provided. 2)  7/10  low back, hips and down right leg, premedicated, rest and repositioned.  At end of session patient reported 6/10 pain. ADL: See FIM for current functional status  Therapy/Group: 1) Individual Therapy, 2) Co-treat w/ PT  Cordelia Bessinger 09/03/2014, 11:19 AM

## 2014-09-03 NOTE — Progress Notes (Signed)
Recreational Therapy Session Note  Patient Details  Name: Kimberly CamelRebecca P Belmonte MRN: [id number] Date of Birth: Apr 27, 1956 Today's Date: 09/03/2014  Pain: no c/o Skilled Therapeutic Interventions/Progress Updates: Session focused on discussing community reintegration/outing to grocery store tomorrow.  Discussion included potential goals and providing answers to pt's question/concerns as pt states she is a little anxious about going out.  Pt stated clarification relieved worries and agreeable to participate.  Discussion also included listed ingredients for cooking task to be scheduled on Thursday.  Pt expressing a long history of anxiety and depression from previous life experiences.  Emotional support provided.  Pt states she is coping ok with current hospitalization and anxious to "get better"  Pt state appreciation of session focused on coping and decreasing anxiety & stress.  Therapy/Group: Individual Therapy Kyliyah Stirn 09/03/2014, 10:58 AM

## 2014-09-03 NOTE — Progress Notes (Addendum)
Physical Therapy Session Note  Patient Details  Name: Kimberly [name] P Bily MRN: [id number] Date of Birth: Feb 03, 1956  Today's Date: 09/03/2014 PT Individual Time: 1510-1610 PT Individual Time Calculation (min): 60 min   Short Term Goals: Week 1:  PT Short Term Goal 1 (Week 1): Pt will perform all aspects of bed mobility at S level, maintianing back precautions PT Short Term Goal 2 (Week 1): Pt will perform sit<>stand at min A level consistently PT Short Term Goal 3 (Week 1): Pt will self propel w/c x 100' with BUEs at S level  PT Short Term Goal 4 (Week 1): Pt will ambulate x 15' with RW at mod A (+2 for chair follow)  Skilled Therapeutic Interventions/Progress Updates:   Session focused on generalized LE strengthening and standing tolerance. Pt received sitting in w/c, agreeable to therapy. Pt able to verbalize 3/3 back precautions and needed only one cue to prevent breaking precautions with reaching for objects by twisting. W/c mobility using BUEs x 150 ft with supervision and 1 rest break. Pt performed stand pivot transfer using RW to NuStep. Pt performed NuStep for LE strengthening using BLEs, Level 3 x 10 min. Gait training using RW x 20 ft and x 15 ft, min guard. Pt performed OTAGO exercises for general strengthening and fall prevention, x 10 each LE: LAQ, heel/toe raises, knee flexion, hip flexion, mini squats, using RW for UE support. Pt requesting to do laundry. Returned to room in w/c to gather dirty clothes. Patient stood using RW with intermittent UE support to load top-loading washer and set controls x 3-4 min. Pt requires verbal/tactile cues to WB on RLE as she tends to stand with weight shifted to LLE. Pt returned to w/c using RW and propelled w/c back to room with supervision. Pt requesting to use bathroom and performed squat pivot transfer w/c > toilet and managed clothing using grab bar, supervision. Pt left sitting on commode and verbalized need to call for assistance.   Therapy  Documentation Precautions:  Precautions Precautions: Fall;Back Precaution Booklet Issued: Yes (comment) Precaution Comments: RLE buckling with WB Restrictions Weight Bearing Restrictions: No Pain: Pain Assessment Pain Assessment: 0-10 Pain Score: 7  Pain Type: Surgical pain Pain Location: Back Pain Orientation: Lower Pain Intervention(s): Medication (See eMAR)  See FIM for current functional status  Therapy/Group: Individual Therapy  Kerney ElbeVarner, Herman A 09/03/2014, 4:21 PM

## 2014-09-03 NOTE — Progress Notes (Signed)
Pt puts her self on and off CPAP. CPAP is on auto titrate, max 9cmH2o and min 5cmH2O with 2.5L O2 bleed in.

## 2014-09-03 NOTE — Progress Notes (Signed)
Subjective/Complaints: 58 y.o. 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 Patient elected to undergo L2-3 decompressive lam with repair of complex dural laceration on 08/23/14 by Dr. Annette Stable. Post op on bedrest till 10/05 and patient with reports of RLE numbness and heaviness. Patient continues to be limited by RLE instability requiring blocking to prevent buckling with transfer.     Burning pain slightly improved last noc, slept better  Review of Systems - Negative except burning pain R>L thigh Objective: Vital Signs: Blood pressure 118/68, pulse 60, temperature 97.6 F (36.4 C), temperature source Oral, resp. rate 18, height 5' (1.524 m), weight 112.3 kg (247 lb 9.2 oz), SpO2 96.00%. No results found. Results for orders placed during the hospital encounter of 08/28/14 (from the past 72 hour(s))  GLUCOSE, CAPILLARY     Status: Abnormal   Collection Time    08/31/14 11:36 AM      Result Value Ref Range   Glucose-Capillary 121 (*) 70 - 99 mg/dL  GLUCOSE, CAPILLARY     Status: Abnormal   Collection Time    08/31/14  4:34 PM      Result Value Ref Range   Glucose-Capillary 165 (*) 70 - 99 mg/dL  GLUCOSE, CAPILLARY     Status: Abnormal   Collection Time    08/31/14  8:43 PM      Result Value Ref Range   Glucose-Capillary 53 (*) 70 - 99 mg/dL   Comment 1 Notify RN    GLUCOSE, CAPILLARY     Status: Abnormal   Collection Time    08/31/14  9:01 PM      Result Value Ref Range   Glucose-Capillary 64 (*) 70 - 99 mg/dL   Comment 1 Notify RN    GLUCOSE, CAPILLARY     Status: Abnormal   Collection Time    08/31/14  9:30 PM      Result Value Ref Range   Glucose-Capillary 157 (*) 70 - 99 mg/dL   Comment 1 Notify RN    GLUCOSE, CAPILLARY     Status: Abnormal   Collection Time    09/01/14  6:25 AM      Result Value Ref Range   Glucose-Capillary 122 (*) 70 - 99 mg/dL   Comment 1 Notify  RN    URIC ACID     Status: None   Collection Time    09/01/14  9:50 AM      Result Value Ref Range   Uric Acid, Serum 4.2  2.4 - 7.0 mg/dL  BASIC METABOLIC PANEL     Status: Abnormal   Collection Time    09/01/14  9:50 AM      Result Value Ref Range   Sodium 137  137 - 147 mEq/L   Potassium 4.1  3.7 - 5.3 mEq/L   Chloride 101  96 - 112 mEq/L   CO2 27  19 - 32 mEq/L   Glucose, Bld 200 (*) 70 - 99 mg/dL   BUN 12  6 - 23 mg/dL   Creatinine, Ser 0.76  0.50 - 1.10 mg/dL   Calcium 8.9  8.4 - 10.5 mg/dL   GFR calc non Af Amer >90  >90 mL/min   GFR calc Af Amer >90  >90 mL/min   Comment: (NOTE)     The eGFR has been calculated using the CKD EPI equation.     This calculation has  not been validated in all clinical situations.     eGFR's persistently <90 mL/min signify possible Chronic Kidney     Disease.   Anion gap 9  5 - 15  GLUCOSE, CAPILLARY     Status: Abnormal   Collection Time    09/01/14 11:35 AM      Result Value Ref Range   Glucose-Capillary 141 (*) 70 - 99 mg/dL   Comment 1 Notify RN    GLUCOSE, CAPILLARY     Status: None   Collection Time    09/01/14  4:33 PM      Result Value Ref Range   Glucose-Capillary 98  70 - 99 mg/dL   Comment 1 Notify RN    GLUCOSE, CAPILLARY     Status: Abnormal   Collection Time    09/01/14  9:40 PM      Result Value Ref Range   Glucose-Capillary 170 (*) 70 - 99 mg/dL  GLUCOSE, CAPILLARY     Status: None   Collection Time    09/02/14  7:03 AM      Result Value Ref Range   Glucose-Capillary 81  70 - 99 mg/dL  GLUCOSE, CAPILLARY     Status: Abnormal   Collection Time    09/02/14 11:11 AM      Result Value Ref Range   Glucose-Capillary 142 (*) 70 - 99 mg/dL   Comment 1 Notify RN    GLUCOSE, CAPILLARY     Status: None   Collection Time    09/02/14  4:30 PM      Result Value Ref Range   Glucose-Capillary 92  70 - 99 mg/dL  GLUCOSE, CAPILLARY     Status: Abnormal   Collection Time    09/02/14  9:22 PM      Result Value Ref Range    Glucose-Capillary 140 (*) 70 - 99 mg/dL  GLUCOSE, CAPILLARY     Status: None   Collection Time    09/03/14  6:56 AM      Result Value Ref Range   Glucose-Capillary 92  70 - 99 mg/dL     HEENT: normal Cardio: RRR and no murmur Resp: CTA B/L and unlabored GI: BS positive and NT,ND Extremity:  Pulses positive and No Edema Skin:  No breakdown Right thigh  leg or ankle Neuro: Alert/Oriented, Anxious and Abnormal Sensory decreased R> L ant thigh, leg and foot 5/5 in BUE 3- B HF, 4- KE 4 L ADF, 3-R ADF Musc/Skel:  Normal Gen NAD   Assessment/Plan: 1. Functional deficits secondary to Lumbar stenosis with neurogenic claudication, s/p decompression and fusion which require 3+ hours per day of interdisciplinary therapy in a comprehensive inpatient rehab setting. Physiatrist is providing close team supervision and 24 hour management of active medical problems listed below. Physiatrist and rehab team continue to assess barriers to discharge/monitor patient progress toward functional and medical goals. FIM: FIM - Bathing Bathing Steps Patient Completed: Chest;Right Arm;Left Arm;Abdomen;Right upper leg;Left upper leg;Right lower leg (including foot);Left lower leg (including foot);Front perineal area (LH sponge) Bathing: 4: Min-Patient completes 8-9 72f10 parts or 75+ percent   FIM - Upper Body Dressing/Undressing Upper body dressing/undressing steps patient completed: Thread/unthread right sleeve of pullover shirt/dresss;Thread/unthread left sleeve of pullover shirt/dress;Put head through opening of pull over shirt/dress;Pull shirt over trunk;Thread/unthread right bra strap;Thread/unthread left bra strap Upper body dressing/undressing: 5: Supervision: Safety issues/verbal cues FIM - Lower Body Dressing/Undressing Lower body dressing/undressing steps patient completed: Pull underwear up/down;Thread/unthread left underwear leg;Thread/unthread right underwear  leg;Thread/unthread right pants  leg;Thread/unthread left pants leg;Pull pants up/down;Don/Doff right sock;Don/Doff left sock;Don/Doff left shoe (reacher, sock aid and elastic shoe laces) Lower body dressing/undressing: 4: Min-Patient completed 75 plus % of tasks  FIM - Toileting Toileting steps completed by patient: Adjust clothing prior to toileting;Performs perineal hygiene;Adjust clothing after toileting Toileting Assistive Devices: Grab bar or rail for support Toileting: 4: Steadying assist  FIM - Radio producer Devices: Grab bars Toilet Transfers: 4-To toilet/BSC: Min A (steadying Pt. > 75%);4-From toilet/BSC: Min A (steadying Pt. > 75%)  FIM - Control and instrumentation engineer Devices: Walker;Arm rests Bed/Chair Transfer: 5: Supine > Sit: Supervision (verbal cues/safety issues);4: Sit > Supine: Min A (steadying pt. > 75%/lift 1 leg);4: Bed > Chair or W/C: Min A (steadying Pt. > 75%);4: Chair or W/C > Bed: Min A (steadying Pt. > 75%)  FIM - Locomotion: Wheelchair Distance: 150 Locomotion: Wheelchair: 5: Travels 150 ft or more: maneuvers on rugs and over door sills with supervision, cueing or coaxing FIM - Locomotion: Ambulation Locomotion: Ambulation Assistive Devices: Administrator Ambulation/Gait Assistance: 4: Min assist Locomotion: Ambulation: 1: Travels less than 50 ft with minimal assistance (Pt.>75%)  Comprehension Comprehension Mode: Auditory Comprehension: 5-Follows basic conversation/direction: With no assist  Expression Expression Mode: Verbal Expression: 5-Expresses basic needs/ideas: With extra time/assistive device  Social Interaction Social Interaction: 6-Interacts appropriately with others with medication or extra time (anti-anxiety, antidepressant).  Problem Solving Problem Solving: 6-Solves complex problems: With extra time  Memory Memory: 7-Complete Independence: No helper  Medical Problem List and Plan:  1. Functional deficits secondary  to lumbar stenosis with neurogenic claudication  2. DVT Prophylaxis/Anticoagulation: Pharmaceutical: Lovenox  3. Pain Management: Continue neurontin for neuropathy and trial ultram for pain management. Flexeril effective for spasms. Right foot pain likely due to nerve root lesion, urate neg,adjust meds 4. Mood: Mood stable and appears brighter today. LCSW to follow for evaluation and support.  5. Neuropsych: This patient is capable of making decisions on her own behalf.  6. Skin/Wound Care: Pressure relief measures--turn 2-3 hours at night to help with pressure and pain relief. . Monitor hydration and nutritional status additionally.  7. Fluids/Electrolytes/Nutrition: Monitor I/O.   8. DM type 2 with diabetic neuropathy: Monitor blood sugars ac/hs. Patient has had low BS and has refused lantus for past 24 hours--encourage use of lantus 25 units am and 40 units in pm. Off Victoza at this time. Will decrease meal coverage to prevent hypoglycemic episodes.  9. HTN: Monitor BP every 8 hours. Continue lisinopril, coreg and Norvasc.  10 Non-obstructive CAD: Resume ASA. Continue Coreg, Lipitor and Lisinopril  11. OSA: Continue CPAP with oxygen bled in.  12. Pulmonary HTN/ Asthma: Will encourage IS. Advair bid with duonebs prn SOB/wheezing.  13. ABLA: Improved past transfusion. Will check follow up labs in am.  14. . Hypokalemia: Likely dilutional from IVF. Will discontinue and encourage po intake. Check labs in am.  15. Dysuria: She reports that she completed doxycyline PTA. Will discontinue and check UA/UCS due to complaints of dysuria.    LOS (Days) 6 A FACE TO FACE EVALUATION WAS PERFORMED  Dwane Andres E 09/03/2014, 8:00 AM

## 2014-09-03 NOTE — Progress Notes (Signed)
Physical Therapy Session Note  Patient Details  Name: Kimberly [name] MRN: [id number] Date of Birth: 1956/11/03  Today's Date: 09/03/2014 PT Co-Treatment Time: 1330-1400 (Co-treatment with OT 1300-1400) PT Co-Treatment Time Calculation (min): 30 min  Short Term Goals: Week 1:  PT Short Term Goal 1 (Week 1): Pt will perform all aspects of bed mobility at S level, maintianing back precautions PT Short Term Goal 2 (Week 1): Pt will perform sit<>stand at min A level consistently PT Short Term Goal 3 (Week 1): Pt will self propel w/c x 100' with BUEs at S level  PT Short Term Goal 4 (Week 1): Pt will ambulate x 15' with RW at mod A (+2 for chair follow)  Skilled Therapeutic Interventions/Progress Updates:    Pt seen for skilled co-treat with OT with focus on tub bench transfer, car transfer, home access with RW, and stair negotiation. Refer to OT note for tub bench transfer and patient education regarding back precautions. Pt performed w/c mobility using BUEs forward and backwards x 200 ft and forward only x 150 ft to return to room at end of session, supervision and assist for leg rest management to maintain precautions. Pt instructed to perform sit <> stand with use of BUE on thighs for increased strengthening and preventing possible twisting or bending to reach for arm rests, patient able to perform with supervision throughout session. Gait training using RW x 45 ft with min guard and verbal cues for forward gaze, keeping safe distance between body and RW, and attempting to take smaller R step to facilitate step-through pattern with LLE versus step-to pattern. Pt performed car transfer to small SUV height with mod verbal cues for sequencing and technique, overall supervision. Pt performed side stepping using RW to right and left with min assist for improved home access to bathroom, through narrow doorways, etc. Pt reports using rollator at home, patient educated regarding need for RW at this time due  to increased dependence on UEs and improved safety. Pt verbalized understanding. Pt negotiated up/down three 6" steps using 2 rails with mod A and min cues for sequencing. Pt with no episodes of R knee buckling but reporting increased fatigue in RLE with ambulation/activity. Pt with SOB due to fatigue and anxiety with mobility and requires verbal cues for pursed lip breathing. Pt returned to room and left sitting in w/c with all needs within reach.   Therapy Documentation Precautions:  Precautions Precautions: Fall;Back Precaution Booklet Issued: Yes (comment) Precaution Comments: RLE buckling with WB Restrictions Weight Bearing Restrictions: No Pain: Pain Assessment Pain Assessment: 0-10 Pain Score: 7  Pain Type: Surgical pain Pain Location: Back Pain Orientation: Lower Pain Radiating Towards: R leg Pain Descriptors / Indicators: Aching Pain Frequency: Constant Pain Onset: On-going Pain Intervention(s): Medication (See eMAR)  See FIM for current functional status  Therapy/Group: Co-Treatment with OT   Bayard HuggerVarner, Loretta A 09/03/2014, 2:11 PM

## 2014-09-04 ENCOUNTER — Inpatient Hospital Stay (HOSPITAL_COMMUNITY): Payer: Medicare Other | Admitting: Physical Therapy

## 2014-09-04 ENCOUNTER — Inpatient Hospital Stay (HOSPITAL_COMMUNITY): Payer: Medicare Other | Admitting: Occupational Therapy

## 2014-09-04 ENCOUNTER — Inpatient Hospital Stay (HOSPITAL_COMMUNITY): Payer: Medicare Other | Admitting: Rehabilitation

## 2014-09-04 DIAGNOSIS — I5032 Chronic diastolic (congestive) heart failure: Secondary | ICD-10-CM

## 2014-09-04 DIAGNOSIS — M47817 Spondylosis without myelopathy or radiculopathy, lumbosacral region: Secondary | ICD-10-CM

## 2014-09-04 LAB — GLUCOSE, CAPILLARY
GLUCOSE-CAPILLARY: 146 mg/dL — AB (ref 70–99)
GLUCOSE-CAPILLARY: 122 mg/dL — AB (ref 70–99)
Glucose-Capillary: 149 mg/dL — ABNORMAL HIGH (ref 70–99)
Glucose-Capillary: 101 mg/dL — ABNORMAL HIGH (ref 70–99)

## 2014-09-04 LAB — CREATININE, SERUM
Creatinine, Ser: 0.75 mg/dL (ref 0.50–1.10)
GFR calc non Af Amer: >90 mL/min (ref >90)

## 2014-09-04 MED ORDER — INSULIN ASPART 100 UNIT/ML ~~LOC~~ SOLN
10.0000 [IU] | Freq: Every day | SUBCUTANEOUS | Status: DC
  Administered 2014-09-05 – 2014-09-07 (×3): 10 [IU] via SUBCUTANEOUS

## 2014-09-04 MED ORDER — INSULIN ASPART 100 UNIT/ML ~~LOC~~ SOLN
25.0000 [IU] | Freq: Every day | SUBCUTANEOUS | Status: DC
  Administered 2014-09-04 – 2014-09-06 (×3): 25 [IU] via SUBCUTANEOUS

## 2014-09-04 MED ORDER — GABAPENTIN 300 MG PO CAPS
300.0000 mg | ORAL_CAPSULE | Freq: Two times a day (BID) | ORAL | Status: DC
  Administered 2014-09-04 – 2014-09-05 (×4): 300 mg via ORAL
  Filled 2014-09-04 (×7): qty 1

## 2014-09-04 NOTE — Plan of Care (Signed)
Problem: RH Ambulation Goal: LTG Patient will ambulate in controlled environment (PT) LTG: Patient will ambulate in a controlled environment, # of feet with assistance (PT).  Upgraded distance due to progression in therapy.

## 2014-09-04 NOTE — Progress Notes (Signed)
Recreational Therapy Session Note  Patient Details  Name: Kimberly [name] MRN: [id number] Date of Birth: [birth year] Today's Date: 09/04/2014  Pain: no c/o Skilled Therapeutic Interventions/Progress Updates: Session focused on community reintegration/outing to grocery using both w/c & RW.  Goals included functional mobility, dynamic standing balance, adherence to back precautions, problem solving, safety awareness, identification & negotiation of obstacles & energy conservation.  P required supervision, min questioning cues for all of the above.  See outing goal sheet for full details.  Therapy/Group: [facility]  [name] 09/04/2014, 4:29 PM

## 2014-09-04 NOTE — Progress Notes (Addendum)
Inpatient Diabetes Program Recommendations  AACE/ADA: New Consensus Statement on Inpatient Glycemic Control (2013)  Target Ranges:  Prepandial:   less than 140 mg/dL      Peak postprandial:   less than 180 mg/dL (1-2 hours)      Critically ill patients:  140 - 180 mg/dL   Results for Kimberly [name], Farra P (MRN [id number]) as of 09/04/2014 12:49  Ref. Range 09/03/2014 06:56 09/03/2014 11:19 09/03/2014 16:19 09/03/2014 16:38 09/03/2014 20:50 09/04/2014 07:56 09/04/2014 11:24  Glucose-Capillary Latest Range: 70-99 mg/dL 92 119115 (H) 46 (L) 85 147184 (H) 146 (H) 122 (H)   Current orders for Inpatient glycemic control: Lantus 35 units QHS, Lantus 25 units QAM, Novolog 10 units with lunch, Novolog 25 units with supper, Novolog 0-9 units TID with meals, Novolog 0-5 units QHS  Inpatient Diabetes Program Recommendations Insulin - Meal Coverage: Noted Novolog given with lunch as meal coverage was decreased from 15 units to 10 units to prevent further episodes of hypoglycemia.  Thanks, Orlando PennerMarie Kassem Kibbe, RN, MSN, CCRN Diabetes Coordinator Inpatient Diabetes Program [phone] (Team Pager) [phone] ([facility] office) [phone] [facility] ([place] office)

## 2014-09-04 NOTE — Progress Notes (Addendum)
Subjective/Complaints: 58 y.o. 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 Patient elected to undergo L2-3 decompressive lam with repair of complex dural laceration on 08/23/14 by Dr. Annette Stable. Post op on bedrest till 10/05 and patient with reports of RLE numbness and heaviness. Patient continues to be limited by RLE instability requiring blocking to prevent buckling with transfer.     Burning pain kept her awake at noc also with problems during the day  Review of Systems - Negative except burning pain R>L thigh Objective: Vital Signs: Blood pressure 111/57, pulse 60, temperature 98 F (36.7 C), temperature source Oral, resp. rate 18, height 5' (1.524 m), weight 112.3 kg (247 lb 9.2 oz), SpO2 99.00%. No results found. Results for orders placed during the hospital encounter of 08/28/14 (from the past 72 hour(s))  URIC ACID     Status: None   Collection Time    09/01/14  9:50 AM      Result Value Ref Range   Uric Acid, Serum 4.2  2.4 - 7.0 mg/dL  BASIC METABOLIC PANEL     Status: Abnormal   Collection Time    09/01/14  9:50 AM      Result Value Ref Range   Sodium 137  137 - 147 mEq/L   Potassium 4.1  3.7 - 5.3 mEq/L   Chloride 101  96 - 112 mEq/L   CO2 27  19 - 32 mEq/L   Glucose, Bld 200 (*) 70 - 99 mg/dL   BUN 12  6 - 23 mg/dL   Creatinine, Ser 0.76  0.50 - 1.10 mg/dL   Calcium 8.9  8.4 - 10.5 mg/dL   GFR calc non Af Amer >90  >90 mL/min   GFR calc Af Amer >90  >90 mL/min   Comment: (NOTE)     The eGFR has been calculated using the CKD EPI equation.     This calculation has not been validated in all clinical situations.     eGFR's persistently <90 mL/min signify possible Chronic Kidney     Disease.   Anion gap 9  5 - 15  GLUCOSE, CAPILLARY     Status: Abnormal   Collection Time    09/01/14 11:35 AM      Result Value Ref Range   Glucose-Capillary 141 (*) 70 - 99 mg/dL    Comment 1 Notify RN    GLUCOSE, CAPILLARY     Status: None   Collection Time    09/01/14  4:33 PM      Result Value Ref Range   Glucose-Capillary 98  70 - 99 mg/dL   Comment 1 Notify RN    GLUCOSE, CAPILLARY     Status: Abnormal   Collection Time    09/01/14  9:40 PM      Result Value Ref Range   Glucose-Capillary 170 (*) 70 - 99 mg/dL  GLUCOSE, CAPILLARY     Status: None   Collection Time    09/02/14  7:03 AM      Result Value Ref Range   Glucose-Capillary 81  70 - 99 mg/dL  GLUCOSE, CAPILLARY     Status: Abnormal   Collection Time    09/02/14 11:11 AM      Result Value Ref Range   Glucose-Capillary 142 (*) 70 - 99 mg/dL   Comment 1 Notify RN    GLUCOSE, CAPILLARY     Status: None  Collection Time    09/02/14  4:30 PM      Result Value Ref Range   Glucose-Capillary 92  70 - 99 mg/dL  GLUCOSE, CAPILLARY     Status: Abnormal   Collection Time    09/02/14  9:22 PM      Result Value Ref Range   Glucose-Capillary 140 (*) 70 - 99 mg/dL  GLUCOSE, CAPILLARY     Status: None   Collection Time    09/03/14  6:56 AM      Result Value Ref Range   Glucose-Capillary 92  70 - 99 mg/dL  GLUCOSE, CAPILLARY     Status: Abnormal   Collection Time    09/03/14 11:19 AM      Result Value Ref Range   Glucose-Capillary 115 (*) 70 - 99 mg/dL   Comment 1 Notify RN    GLUCOSE, CAPILLARY     Status: Abnormal   Collection Time    09/03/14  4:19 PM      Result Value Ref Range   Glucose-Capillary 46 (*) 70 - 99 mg/dL  GLUCOSE, CAPILLARY     Status: None   Collection Time    09/03/14  4:38 PM      Result Value Ref Range   Glucose-Capillary 85  70 - 99 mg/dL  GLUCOSE, CAPILLARY     Status: Abnormal   Collection Time    09/03/14  8:50 PM      Result Value Ref Range   Glucose-Capillary 184 (*) 70 - 99 mg/dL  CREATININE, SERUM     Status: None   Collection Time    09/04/14  6:30 AM      Result Value Ref Range   Creatinine, Ser 0.75  0.50 - 1.10 mg/dL   GFR calc non Af Amer >90  >90  mL/min   GFR calc Af Amer >90  >90 mL/min   Comment: (NOTE)     The eGFR has been calculated using the CKD EPI equation.     This calculation has not been validated in all clinical situations.     eGFR's persistently <90 mL/min signify possible Chronic Kidney     Disease.  GLUCOSE, CAPILLARY     Status: Abnormal   Collection Time    09/04/14  7:56 AM      Result Value Ref Range   Glucose-Capillary 146 (*) 70 - 99 mg/dL   Comment 1 Notify RN       HEENT: normal Cardio: RRR and no murmur Resp: CTA B/L and unlabored GI: BS positive and NT,ND Extremity:  Pulses positive and No Edema Skin:  No breakdown Right thigh  leg or ankle Neuro: Alert/Oriented, Anxious and Abnormal Sensory decreased R> L ant thigh, leg and foot 5/5 in BUE 3- B HF, 4- KE 4 L ADF, 3-R ADF Musc/Skel:  Normal Gen NAD   Assessment/Plan: 1. Functional deficits secondary to Lumbar stenosis with neurogenic claudication, s/p decompression and fusion which require 3+ hours per day of interdisciplinary therapy in a comprehensive inpatient rehab setting. Physiatrist is providing close team supervision and 24 hour management of active medical problems listed below. Physiatrist and rehab team continue to assess barriers to discharge/monitor patient progress toward functional and medical goals. Team conference today please see physician documentation under team conference tab, met with team face-to-face to discuss problems,progress, and goals. Formulized individual treatment plan based on medical history, underlying problem and comorbidities. FIM: FIM - Bathing Bathing Steps Patient Completed: Chest;Right Arm;Left Arm;Abdomen;Right upper leg;Left upper leg;Right  lower leg (including foot);Left lower leg (including foot);Front perineal area;Buttocks (LH sponge) Bathing: 4: Steadying assist (LOB standing required Max assist to recover.)   FIM - Upper Body Dressing/Undressing Upper body dressing/undressing steps patient  completed: Thread/unthread right sleeve of pullover shirt/dresss;Thread/unthread left sleeve of pullover shirt/dress;Put head through opening of pull over shirt/dress;Pull shirt over trunk;Thread/unthread right bra strap;Thread/unthread left bra strap Upper body dressing/undressing: 5: Supervision: Safety issues/verbal cues FIM - Lower Body Dressing/Undressing Lower body dressing/undressing steps patient completed: Pull underwear up/down;Thread/unthread left underwear leg;Thread/unthread right underwear leg;Thread/unthread right pants leg;Thread/unthread left pants leg;Pull pants up/down;Don/Doff right sock;Don/Doff left sock;Don/Doff left shoe;Don/Doff right shoe (reacher, sock aid and elastic shoe laces) Lower body dressing/undressing: 5: Supervision: Safety issues/verbal cues  FIM - Toileting Toileting steps completed by patient: Adjust clothing prior to toileting;Performs perineal hygiene;Adjust clothing after toileting Toileting Assistive Devices: Grab bar or rail for support Toileting: 4: Steadying assist  FIM - Radio producer Devices: Grab bars Toilet Transfers: 4-To toilet/BSC: Min A (steadying Pt. > 75%);4-From toilet/BSC: Min A (steadying Pt. > 75%)  FIM - Bed/Chair Transfer Bed/Chair Transfer Assistive Devices: Copy: 4: Bed > Chair or W/C: Min A (steadying Pt. > 75%);4: Chair or W/C > Bed: Min A (steadying Pt. > 75%)  FIM - Locomotion: Wheelchair Distance: 150 Locomotion: Wheelchair: 5: Travels 150 ft or more: maneuvers on rugs and over door sills with supervision, cueing or coaxing FIM - Locomotion: Ambulation Locomotion: Ambulation Assistive Devices: Administrator Ambulation/Gait Assistance: 4: Min guard Locomotion: Ambulation: 1: Travels less than 50 ft with minimal assistance (Pt.>75%)  Comprehension Comprehension Mode: Auditory Comprehension: 5-Follows basic conversation/direction: With no  assist  Expression Expression Mode: Verbal Expression: 5-Expresses basic needs/ideas: With no assist  Social Interaction Social Interaction: 6-Interacts appropriately with others with medication or extra time (anti-anxiety, antidepressant).  Problem Solving Problem Solving: 5-Solves basic 90% of the time/requires cueing < 10% of the time  Memory Memory: 7-Complete Independence: No helper  Medical Problem List and Plan:  1. Functional deficits secondary to lumbar stenosis with neurogenic claudication  2. DVT Prophylaxis/Anticoagulation: Pharmaceutical: Lovenox  3. Pain Management: Continue neurontin increase to BID and trial ultram for pain management. Flexeril effective for spasms. Right foot pain likely due to nerve root lesion, urate neg,adjust meds 4. Mood: Mood stable and appears brighter today. LCSW to follow for evaluation and support.  5. Neuropsych: This patient is capable of making decisions on her own behalf.  6. Skin/Wound Care: Pressure relief measures--turn 2-3 hours at night to help with pressure and pain relief. . Monitor hydration and nutritional status additionally.  7. Fluids/Electrolytes/Nutrition: Monitor I/O.   8. DM type 2 with diabetic neuropathy: Monitor blood sugars ac/hs.  Off Victoza at this time. Will decrease noon meal coverage to prevent hypoglycemic episodes.  9. HTN: Monitor BP every 8 hours. Continue lisinopril, coreg and Norvasc.  10 Non-obstructive CAD: Resume ASA. Continue Coreg, Lipitor and Lisinopril  11. OSA: Continue CPAP with oxygen bled in.  12. Pulmonary HTN/ Asthma: Will encourage IS. Advair bid with duonebs prn SOB/wheezing.  13. ABLA: Improved past transfusion. Will check follow up labs in am.  14. . Hypokalemia: Likely dilutional from IVF. Will discontinue and encourage po intake. Check labs in am.  15. Dysuria: She reports that she completed doxycyline PTA. Will discontinue and check UA/UCS due to complaints of dysuria.    LOS (Days)  7 A FACE TO FACE EVALUATION WAS PERFORMED  Kimberly [name] E 09/04/2014, 9:06 AM

## 2014-09-04 NOTE — Progress Notes (Signed)
Physical Therapy Session Note  Patient Details  Name: Kimberly [name] MRN: [id number] Date of Birth: [birth year]  Today's Date: 09/04/2014 PT Individual Time: 1030-1115 PT Individual Time Calculation (min): 45 min   Short Term Goals: Week 1:  PT Short Term Goal 1 (Week 1): Pt will perform all aspects of bed mobility at S level, maintianing back precautions PT Short Term Goal 2 (Week 1): Pt will perform sit<>stand at min A level consistently PT Short Term Goal 3 (Week 1): Pt will self propel w/c x 100' with BUEs at S level  PT Short Term Goal 4 (Week 1): Pt will ambulate x 15' with RW at mod A (+2 for chair follow)  Skilled Therapeutic Interventions/Progress Updates:   Pt received sitting in w/c, ready for therapy. Session focused on gait training in home environment and bed mobility. Pt propelled w/c x 100 ft using BUEs, supervision. Pt performed gait training using RW x 50 ft, 75 ft, and 25 ft with close supervision in home environment, no episodes of knee buckling. Pt performed furniture transfer to low, compliant couch surface with min A for sit > stand. Pt performed sit <> stand from w/c and recliner pushing up with UEs on thighs and supervision. In room, patient performed stand pivot transfer w/c <> bed without RW and min A with slight R knee buckle due to no UE support. Pt reports having high bed at home. Pt performed bed mobility on slightly raised flat bed with supervision to both R and L sides and able to maintain back precautions. Pt continues to require verbal cues for pursed lip breathing with functional mobility. Pt left sitting in w/c with all needs within reach.  Therapy Documentation Precautions:  Precautions Precautions: Fall;Back Precaution Booklet Issued: Yes (comment) Precaution Comments: RLE buckling with WB Restrictions Weight Bearing Restrictions: No Pain:  No c/o pain  See FIM for current functional status  Therapy/Group: Individual Therapy  Kerney ElbeVarner, Marzella  A 09/04/2014, 11:46 AM

## 2014-09-04 NOTE — Progress Notes (Signed)
Occupational Therapy Session Note  Patient Details  Name: Kimberly [name] MRN: [id number] Date of Birth: 02/19/1956  Today's Date: 09/04/2014 OT Individual Time: [id number] OT Individual Time Calculation (min): 60 min    Short Term Goals: Week 1:  OT Short Term Goal 1 (Week 1): Pt will perform bathing with min A in order to increase I in self care. OT Short Term Goal 2 (Week 1): Pt will perform toileting with min A in order to increase I in self care. OT Short Term Goal 3 (Week 1): Pt will perform toilet transfer with min A in order to increase I in functional transfers. OT Short Term Goal 4 (Week 1): Pt will perform shower transfer with min A in order to increase I in functional transfers. OT Short Term Goal 5 (Week 1): Pt will tolerate 15 minutes of ADL session without requiring rest break in order to increase endurance.   Skilled Therapeutic Interventions/Progress Updates:  Pt seated in wheelchair upon entering the room with 4/10 c/o pain in lower back this session. Pt obtained all items prior to therapist arrival per pt report. Pt propelled wheelchair into shower for transfer  - w/c <> TTB with steady assist for safety and use of grab bar as needed. Pt performed bathing with supervision as pt utilized LH sponge to wash B LEs and feet to adhere to back precautions. Pt engaged in lateral leans while seated to was peri area and buttocks. Pt performed dressing while seated in wheelchair at sink side. Pt utilized sock aide and LH reacher to donn LB clothing with supervision. When standing for clothing management pt did require steady assist with use of RW for safety. Pt making excellent progress towards goals this session. Pt seated in wheelchair with call bell and breakfast upon exiting the room.   Therapy Documentation Precautions:  Precautions Precautions: Fall;Back Precaution Booklet Issued: Yes (comment) Precaution Comments: RLE buckling with WB Restrictions Weight Bearing  Restrictions: No Pain: Pain Assessment Pain Score: 4   See FIM for current functional status  Therapy/Group: Individual Therapy  Lowella Gripittman, Suetta Hoffmeister L 09/04/2014, 11:29 AM

## 2014-09-04 NOTE — Progress Notes (Signed)
Social Work Patient ID: Kimberly [name], female   DOB: Jul 25, 1956, 58 y.o.   MRN: [id number] Met with pt to inform of team conference progression toward goals of supervision level and discharge date 10/17. She is pleased with her progress and reports her pain is better.  Discussed DME and follow up needs.  She feels her tub bench will be covered. Will check on this.  Pt reports her husband sleeps during the day and friend doesn't drive much so home health would be better.  No preference will work toward discharge Sat.

## 2014-09-04 NOTE — Progress Notes (Signed)
Physical Therapy Session Note  Patient Details  Name: Kimberly [name] MRN: [id number] Date of Birth: 11-06-1956  Today's Date: 09/04/2014 PT Individual Time: 1310-1500 PT Individual Time Calculation (min): 110 min   Short Term Goals: Week 1:  PT Short Term Goal 1 (Week 1): Pt will perform all aspects of bed mobility at S level, maintianing back precautions PT Short Term Goal 2 (Week 1): Pt will perform sit<>stand at min A level consistently PT Short Term Goal 3 (Week 1): Pt will self propel w/c x 100' with BUEs at S level  PT Short Term Goal 4 (Week 1): Pt will ambulate x 15' with RW at mod A (+2 for chair follow)  Skilled Therapeutic Interventions/Progress Updates:   Pt received sitting in w/c in room, having just finished lunch and agreeable to outing.  Discussed pts concerns with earlier D/C than originally planned.  PT states that pt doing very well and is very close to goal level.  Recommend that friend and/or husband come in for family education and to see how pt is moving in order to decrease anxiety at home.  Also discussed equipment needs and the fact that PT recommending w/c for longer distance community use, but that this will likely be short term and can return when she doesn't need anymore.  Pt verbalized understanding.  Skilled session with RT on outing to grocery store in order to work on community integration, w/c propulsion and gait in community setting.  Pt able to perform all tasks at S level with cues for safety.  Worked on problem solving, identifying barriers to being in community, as well as how to maintain back precautions in community.  See paper eval in chart for further details on goals.  Pt returned to rehab unit, ambulated in kitchen to place items in refrigerator at S level with assist for carrying objects.  Also [place] [facility] [facility] be [place] is relocating to.  Ended session with car transfer in ortho gym to simulated home car height.  Pt  unsure of which car they will bring, but discussed letting PT know so that we can practice in therapy.  Pt verbalized understanding.  Pt left in room with all needs.  RN notified of pt needing to use restroom.   Therapy Documentation Precautions:  Precautions Precautions: Fall;Back Precaution Booklet Issued: Yes (comment) Precaution Comments: RLE buckling with WB Restrictions Weight Bearing Restrictions: No   Pain: Pt with min c/o pain in RLE during gait, better with rest.    Locomotion : Ambulation Ambulation/Gait Assistance: 5: Supervision Wheelchair Mobility Distance: 150   See FIM for current functional status  Therapy/Group: Individual Therapy (with RT)  Kimberly [name], Kimberly [name] 09/04/2014, 3:25 PM

## 2014-09-04 NOTE — Progress Notes (Signed)
09/04/14 1630  What Happened  Was fall witnessed? No  Was patient injured? No  Patient found on floor  Found by Staff-comment Ronny Bacon(Sigismund Cross, RN)  Stated prior activity other (comment) (attempted to reach water on bedside table in front of her)  Follow Up  MD notified Marissa NestlePam Love, PA  Time MD notified [phone]  Family notified No- patient refusal  Additional tests No  Progress note created (see row info) Yes  Vitals  BP ! 155/64 mmHg  BP Location Right arm  BP Method Automatic  Patient Position (if appropriate) Sitting  Pulse Rate 68  Pulse Rate Source Dinamap  Oxygen Therapy  SpO2 100 %  O2 Device None (Room air)  Pain Assessment  Pain Assessment No/denies pain  Neurological  Neuro (WDL) X  Level of Consciousness Alert  Orientation Level Oriented X4  RUE Motor Strength 5  LUE Motor Strength 4  RLE Motor Strength 3  LLE Motor Strength 3  Musculoskeletal  Musculoskeletal (WDL) X  Assistive Device Wheelchair  Generalized Weakness Yes  Weight Bearing Restrictions No  Integumentary  Integumentary (WDL) X  Skin Color Appropriate for ethnicity  Skin Condition Dry  Skin Integrity Surgical Incision (see LDA) (changed dressing at fall)    Patient fell on bottom after reaching to get water on bedside table in front of her.  The cushion slid out from the wheelchair and that is why she fell- per patient report.  Patient denies pain, did not hurt anything in the fall.  Assisted patient back to wheelchair, notified Marissa NestlePam Love, PA.  Will continue post fall protocol.  Dani Gobbleeardon, Demetreus Lothamer J, RN

## 2014-09-04 NOTE — Progress Notes (Addendum)
NUTRITION FOLLOW-UP  DOCUMENTATION CODES Per approved criteria  -Morbid Obesity   INTERVENTION: Continue Glucerna Shake po once daily, each supplement provides 220 kcal and 10 grams of protein.  NUTRITION DIAGNOSIS: Increased nutrient needs related to illness as evidenced by estimated nutrition needs; ongoing  Goal: Pt to meet >/= 90% of their estimated nutrition needs; met  Monitor:  PO intake, weight trends, labs, I/O's  58 y.o. 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 Patient elected to undergo L2-3 decompressive lam with repair of complex dural laceration on 08/23/14.  10/8-Meal completion is 75-100%. Pt reports her appetite is good, however reports she has difficulties swallowing food at times. Pt however would not like her diet altered or changed. Pt reports no recent weight loss. Pt no other difficulties or problems. Pt reports she would like Glucerna ordered as she drinks them regularly at home. Will order. Pt with no significant observed fat or muscle mass loss.  10/14-Meal completion is 100%. Pt reports having a good appetite along with her swallowing difficulties no longer a problem. Pt asked about diabetic friendly snack ideas and RD educated pt on snacks. Pt has been drinking her Glucerna. Spoke with RN, pt can not eat tomato products as it causes abdominal issues. Will make a note on health touch.    Height: Ht Readings from Last 1 Encounters:  08/28/14 5' (1.524 m)    Weight: Wt Readings from Last 1 Encounters:  09/04/14 236 lb 8 oz (107.276 kg)    BMI:  Body mass index is 46.19 kg/(m^2). Morbid obesity  Re-Estimated Nutritional Needs: Kcal: 2000-2200 Protein: 100-115 grams Fluid: 2- 2.2 L/day  Skin: incision on back, +2 LE edema  Diet Order: Carb  Control   Intake/Output Summary (Last 24 hours) at 09/04/14 1534 Last data filed at 09/04/14 1230  Gross per 24 hour  Intake   1200 ml  Output      0 ml  Net   1200 ml    Last BM: 10/12  Labs:   Recent Labs Lab 08/29/14 0435 09/01/14 0950 09/04/14 0630  NA 140 137  --   K 4.4 4.1  --   CL 103 101  --   CO2 30 27  --   BUN 9 12  --   CREATININE 0.72 0.76 0.75  CALCIUM 8.8 8.9  --   GLUCOSE 159* 200*  --     CBG (last 3)   Recent Labs  09/03/14 2050 09/04/14 0756 09/04/14 1124  GLUCAP 184* 146* 122*    Scheduled Meds: . amLODipine  2.5 mg Oral Daily  . aspirin EC  81 mg Oral Daily  . atorvastatin  10 mg Oral Daily  . carvedilol  12.5 mg Oral BID WC  . colchicine  0.6 mg Oral BID  . conjugated estrogens  1 Applicatorful Vaginal QHS  . enoxaparin (LOVENOX) injection  40 mg Subcutaneous Q24H  . famotidine  40 mg Oral Daily  . feeding supplement (GLUCERNA SHAKE)  237 mL Oral Q1400  . gabapentin  300 mg Oral BID  . insulin aspart  0-5 Units Subcutaneous QHS  . insulin aspart  0-9 Units Subcutaneous TID WC  . insulin aspart  10 Units Subcutaneous Q lunch   And  . insulin aspart  25 Units Subcutaneous Q supper  . insulin glargine  25 Units Subcutaneous Daily  . insulin glargine  30 Units Subcutaneous QHS  . ketotifen  2 drop Both Eyes BID  . levothyroxine  75 mcg Oral QAC breakfast  . lisinopril  10 mg Oral Daily  . mometasone-formoterol  2 puff Inhalation BID  . rOPINIRole  1 mg Oral QHS  . senna  1 tablet Oral BID    Continuous Infusions:   Past Medical History  Diagnosis Date  . CAD (coronary artery disease)     nonobstructive by cath, 6/08 (false positive Cardiolite) normal stress echo, 7/11  . Heart failure, diastolic, chronic   . GERD (gastroesophageal reflux disease)   . Morbid obesity   . Obstructive sleep apnea   . IDDM (insulin dependent diabetes mellitus)   . HTN (hypertension)   . Shortness of breath   . Asthma   . Spinal stenosis    . Chronic back pain   . Arthritis   . Degenerative joint disease   . Glaucoma   . CHF (congestive heart failure)   . Complication of anesthesia     had an asthma attack when woke up from procedure  . Stroke     12/09/2013  . Pneumonia     as a child  . Hypothyroidism   . Depression   . Hernia of abdominal cavity     "upper and lower hernia"  . Seizures     PMH: only as a child  . Heart murmur     PMH:As a child only  . Headache(784.0)   . Neuropathy     associated with diabetes  . Anemia     PMH: as a child  . Fatty liver   . Pancreatitis   . IBS (irritable bowel syndrome)   . Rheumatic fever     PMH: as a child    Past Surgical History  Procedure Laterality Date  . Eye surgery  1967  . Tubal ligation  1975  . Partial hysterectomy  1981  . Hemorrhoid surgery    . Hernia repair    . Spinahatomy  1992  . Cholecystectomy  1994  . Ovaries removed  2003  . Heel (other)  2005  . Abdominal hysterectomy    . Bilateral knee arthroscopy  2000, 2009  . Colonoscopy N/A 08/01/2013    Procedure: COLONOSCOPY;  Surgeon: Rogene Houston, MD;  Location: [facility];  Service: Endoscopy;  Laterality: N/A;  930  . Cataract extraction w/ intraocular lens  implant, bilateral    . Back surgery    . Appendectomy    . Cyst excision      Left breast  . Cardiac catheterization      2009  . Lumbar laminectomy/decompression microdiscectomy Right 08/23/2014    Procedure: LUMBAR LAMINECTOMY/DECOMPRESSION MICRODISCECTOMY 1 LEVEL;  Surgeon: Charlie Pitter, MD;  Location: [facility];  Service: Neurosurgery;  Laterality: Right;  LUMBAR LAMINECTOMY/DECOMPRESSION MICRODISCECTOMY 1 LEVEL LUMBAR 2-3    Kallie Locks, MS, RD, LDN Pager # [phone] After hours/ weekend pager # [phone]

## 2014-09-04 NOTE — Patient Care Conference (Signed)
Inpatient RehabilitationTeam Conference and Plan of Care Update Date: 09/04/2014   Time: 11;30 AM    Patient Name: Kimberly [name]      Medical Record Number: [id number]  Date of Birth: 10-Feb-1956 Sex: Female         Room/Bed: 4M07C/4M07C-01 Payor Info: Payor: [employer] [place] [employer] / Plan: BLUE MEDICARE / Product Type: *No Product type* /    Admitting Diagnosis: sp l2 3 decom lami  Admit Date/Time:  08/28/2014  6:12 PM Admission Comments: No comment available   Primary Diagnosis:  Lumbar stenosis with neurogenic claudication Principal Problem: Lumbar stenosis with neurogenic claudication  Patient Active Problem List   Diagnosis Date Noted  . Nerve pain 08/30/2014  . Lumbar stenosis with neurogenic claudication 08/28/2014  . Lumbosacral spondylosis without myelopathy 08/23/2014  . Lumbosacral stenosis with neurogenic claudication 08/23/2014  . Vaginal discharge 06/22/2014  . Chest pain 06/21/2014  . Spinal stenosis 08/23/2011  . Obstructive sleep apnea 08/23/2011  . Diabetes 08/12/2010  . CAD 08/12/2010  . CHRONIC DIASTOLIC HEART FAILURE 08/12/2010  . HYPERLIPIDEMIA-MIXED 06/23/2009  . OVERWEIGHT/OBESITY 06/23/2009  . Essential hypertension 06/23/2009  . CHEST PAIN-UNSPECIFIED 06/23/2009    Expected Discharge Date: Expected Discharge Date: 09/07/14  Team Members Present: Physician leading conference: Dr. Claudette LawsAndrew Kirsteins Social Worker Present: Dossie DerBecky Emannuel Vise, LCSW Nurse Present: Carmie EndAngie Joyce, RN PT Present: Edman CircleAudra Hall, PT;Emily Parcell, PT;Rosaleah Evelina BucyVarner, PT OT Present: Callie FieldingKatie Pittman, Heath LarkT;James McGuire, OT SLP Present: Jackalyn LombardNicole Page, SLP PPS Coordinator present : Edson SnowballBecky Windsor, PT     Current Status/Progress Goal Weekly Team Focus  Medical   neuropathic pain  maintain adequate CBGs, pain control  adjust Novalog, gabapentin   Bowel/Bladder   [place] of bowel and bladder  continue the current bladder and bowel protocol  offer tolineting schedule    Swallow/Nutrition/ Hydration     WFL        ADL's   Overall Min-Supervision  Overall Supervision  Activity tolerance, overall strengthening, adhering to back precautions, use of AE, toileting, functional mobility with BADL & IADL, dynamic balance, patient & family education.   Mobility   S for bed mobility, Min A for stand pivot transfers with RW, min A gait with RW, min A for stairs  S overall  gait, endurance, balance, RLE strengthening, pt/family education   Communication     [facility]        Safety/Cognition/ Behavioral Observations    no unsafe behaviors        Pain   complain of tingling to right lower exetries  reassess pain every shift  contiune current pain regieme   Skin   dressing change daily to back and as needed  drainage will decrease daily to lumbar incision  lumbar incision wil contiune to heal.      *See Care Plan and progress notes for long and short-term goals.  Barriers to Discharge: see above    Possible Resolutions to Barriers:  Cont rehab    Discharge Planning/Teaching Needs:  Home with husband and friend to assist.  Will need to begin family education.      Team Discussion:  Reaching supervision level-overall.  Pain improved-MD is adjusting meds still.  Monitoring her surgical site for drainage.  Outing today and education with friend.  Revisions to Treatment Plan:  None   Continued Need for Acute Rehabilitation Level of Care: The patient requires daily medical management by a physician with specialized training in physical medicine and rehabilitation for the following conditions: Daily direction  of a multidisciplinary physical rehabilitation program to ensure safe treatment while eliciting the highest outcome that is of practical value to the patient.: Yes Daily medical management of patient stability for increased activity during participation in an intensive rehabilitation regime.: Yes Daily analysis of laboratory values and/or radiology reports with  any subsequent need for medication adjustment of medical intervention for : Neurological problems;Other;Post surgical problems  Lucy ChrisDupree, Annella G 09/05/2014, 9:12 AM

## 2014-09-05 ENCOUNTER — Inpatient Hospital Stay (HOSPITAL_COMMUNITY): Payer: Medicare Other | Admitting: *Deleted

## 2014-09-05 ENCOUNTER — Inpatient Hospital Stay (HOSPITAL_COMMUNITY): Payer: Medicare Other | Admitting: Occupational Therapy

## 2014-09-05 ENCOUNTER — Inpatient Hospital Stay (HOSPITAL_COMMUNITY): Payer: Medicare Other | Admitting: Rehabilitation

## 2014-09-05 LAB — GLUCOSE, CAPILLARY
GLUCOSE-CAPILLARY: 122 mg/dL — AB (ref 70–99)
Glucose-Capillary: 71 mg/dL (ref 70–99)
Glucose-Capillary: 122 mg/dL — ABNORMAL HIGH (ref 70–99)
Glucose-Capillary: 156 mg/dL — ABNORMAL HIGH (ref 70–99)
Glucose-Capillary: 111 mg/dL — ABNORMAL HIGH (ref 70–99)

## 2014-09-05 MED ORDER — MUSCLE RUB 10-15 % EX CREA
TOPICAL_CREAM | CUTANEOUS | Status: DC | PRN
  Administered 2014-09-05: 18:00:00 via TOPICAL
  Filled 2014-09-05: qty 85

## 2014-09-05 NOTE — Progress Notes (Signed)
Patient stated she would prefer to place self on CPAP.

## 2014-09-05 NOTE — Discharge Instructions (Signed)
Inpatient Rehab Discharge Instructions  Audree CamelRebecca P Ambrosio Discharge date and time:    Activities/Precautions/ Functional Status: Activity: no lifting, driving, or strenuous exercise for till cleared by MD.  Diet: diabetic diet Wound Care: keep wound clean and dry. Contact Dr. Jordan LikesPool if you develop redness, increase in pain, swelling or drainage from wound, fevers or chills.   Functional status:  ___ No restrictions     ___ Walk up steps independently ___ 24/7 supervision/assistance   ___ Walk up steps with assistance ___ Intermittent supervision/assistance  ___ Bathe/dress independently ___ Walk with walker     ___ Bathe/dress with assistance ___ Walk Independently    ___ Shower independently ___ Walk with assistance    ___ Shower with assistance _X__ No alcohol     ___ Return to work/school ________  Special Instructions:    COMMUNITY REFERRALS UPON DISCHARGE:    Home Health:   PT, OT, RN  Agency:[facility] Phone:[phone] Date of last service:09/07/2014   Medical Equipment/Items Ordered:WHEELCHAIR, Charlie PitterROLLING WALKER, TUB BENCH  Agency/Supplier:[facility]  [phone]  OTHER: [facility]- DR Leavy CellaKAREN POLLARD   [phone]   My questions have been answered and I understand these instructions. I will adhere to these goals and the provided educational materials after my discharge from the hospital.  Patient/Caregiver Signature _______________________________ Date __________  Clinician Signature _______________________________________ Date __________  Please bring this form and your medication list with you to all your follow-up doctor's appointments.

## 2014-09-05 NOTE — Progress Notes (Signed)
Physical Therapy Session Note  Patient Details  Name: Kimberly [name] MRN: [id number] Date of Birth: [birth year]  Today's Date: 09/05/2014 PT Individual Time: 0830-0930 PT Individual Time Calculation (min): 60 min   Short Term Goals: Week 1:  PT Short Term Goal 1 (Week 1): Pt will perform all aspects of bed mobility at S level, maintianing back precautions PT Short Term Goal 2 (Week 1): Pt will perform sit<>stand at min A level consistently PT Short Term Goal 3 (Week 1): Pt will self propel w/c x 100' with BUEs at S level  PT Short Term Goal 4 (Week 1): Pt will ambulate x 15' with RW at mod A (+2 for chair follow)  Skilled Therapeutic Interventions/Progress Updates:   Pt received sitting in w/c, agreeable to therapy session.  Discussed fall to floor yesterday from w/c.  Pt states cushion slipped out of chair as she was reaching for water.  OT applied dysum under cushion in order to prevent further slipping.  Pt self propelled w/c at mod I level into hallway and performed 107' x 1 with RW at S level with min cues for upright posture and smoother step on LLE.  Then went over OTAGO HEP with 3lb on LLE and 2lb on the R; seated LAQ's x 10 reps, standing knee flex (without weight on RLE, 2lb on LLE), standing mini squats and standing heel/toe raises x 10 reps BLE.  Educated on safety with UE support and that she could use counter top for increased support and also how to increase difficulty by decreasing UE support to single UE>no UE support.  Pt verbalized understanding.  Discussed barriers at home and how to accessible bed is at home.  She states that she has a bedside table and her oxygen concentrator may be in her way.  She states that she could possibly move concentrator and add tubing.  Talked about side stepping to get to bed with RW, accessible bathroom, calling friend for S at night time, and adding night light for safety in restroom.  Pt verbalized understanding.  Performed bed mobility to  simulated home bed height.  Pt did very well and able to perform at S level.  Ended session with another bout of gait x 5575' with RW as stated above.  Also discussed need to have friend or husband come in for family education tomorrow or Saturday morning.  CSW to follow up.  Pt propelled back to room and all needs in reach.   Therapy Documentation Precautions:  Precautions Precautions: Fall;Back Precaution Booklet Issued: Yes (comment) Precaution Comments: RLE buckling with WB Restrictions Weight Bearing Restrictions: No   Vital Signs: Therapy Vitals Temp: 98 F (36.7 C) Temp Source: Oral Pulse Rate: 58 Resp: 18 BP: 115/56 mmHg Patient Position (if appropriate): Lying Oxygen Therapy SpO2: 95 % O2 Device: None (Room air) Pain: Pain Assessment Pain Assessment: 0-10 Pain Score: 7  Pain Type: Surgical pain Pain Location: Back Pain Descriptors / Indicators: Aching Pain Frequency: Constant Pain Onset: On-going Patients Stated Pain Goal: 5 Pain Intervention(s): Refused (pt in therapy and said she wanted to wait until after therap) 2nd Pain Site Pain Score: 8 Pain Type: Surgical pain Pain Location: Hip Pain Orientation: Right Pain Descriptors / Indicators: Aching Pain Frequency: Constant Pain Onset: On-going Patient's Stated Pain Goal: 5 Pain Intervention(s): Medication (See eMAR);Repositioned  See FIM for current functional status  Therapy/Group: Individual Therapy  Vista Deckarcell, Ouida Abeyta Ann 09/05/2014, 8:55 AM

## 2014-09-05 NOTE — Plan of Care (Signed)
Problem: RH PAIN MANAGEMENT Goal: RH STG PAIN MANAGED AT OR BELOW PT'S PAIN GOAL Less than 3 on 0-10 scale  Patient states that pain remains at a 5/10

## 2014-09-05 NOTE — Progress Notes (Signed)
Recreational Therapy Discharge Summary Patient Details  Name: Kimberly [name] MRN: [id number] Date of Birth: 02/09/1956 Today's Date: 09/05/2014  Long term goals set: 2  Long term goals met: 2  Comments on progress toward goals: Pt has made great progress toward goals and is discharging home 10/17 with family to provide 24 hour supervision.  Pt is supervision level for TR tasks including community reintegration.  Pt requires extra time, use of AE & occasional min verbal cues for safe completion of tasks.  Reasons for discharge: discharge from hospital Patient/family agrees with progress made and goals achieved: Yes  [name] 09/05/2014, 3:42 PM

## 2014-09-05 NOTE — Progress Notes (Signed)
Patient has c/o of swollen gland on left side of neck, tender to touch. Patient denies cngestion and / or coughing up sputum, denies chills and fatigue. Will continue to monitor. Diamantina MonksARPENTER,Jeannia Tatro, RN

## 2014-09-05 NOTE — Progress Notes (Signed)
Subjective/Complaints: 58 y.o. 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 Patient elected to undergo L2-3 decompressive lam with repair of complex dural laceration on 08/23/14 by Dr. Annette Stable. Post op on bedrest till 10/05 and patient with reports of RLE numbness and heaviness. Patient continues to be limited by RLE instability requiring blocking to prevent buckling with transfer.    Had a near fall yest slipped off seat cushion but caught herself has R side neck pain but no radiating arm pain  Review of Systems - Negative except burning pain R>L thigh Objective: Vital Signs: Blood pressure 115/56, pulse 58, temperature 98 F (36.7 C), temperature source Oral, resp. rate 18, height 5' (1.524 m), weight 107.276 kg (236 lb 8 oz), SpO2 95.00%. No results found. Results for orders placed during the hospital encounter of 08/28/14 (from the past 72 hour(s))  GLUCOSE, CAPILLARY     Status: Abnormal   Collection Time    09/02/14 11:11 AM      Result Value Ref Range   Glucose-Capillary 142 (*) 70 - 99 mg/dL   Comment 1 Notify RN    GLUCOSE, CAPILLARY     Status: None   Collection Time    09/02/14  4:30 PM      Result Value Ref Range   Glucose-Capillary 92  70 - 99 mg/dL  GLUCOSE, CAPILLARY     Status: Abnormal   Collection Time    09/02/14  9:22 PM      Result Value Ref Range   Glucose-Capillary 140 (*) 70 - 99 mg/dL  GLUCOSE, CAPILLARY     Status: None   Collection Time    09/03/14  6:56 AM      Result Value Ref Range   Glucose-Capillary 92  70 - 99 mg/dL  GLUCOSE, CAPILLARY     Status: Abnormal   Collection Time    09/03/14 11:19 AM      Result Value Ref Range   Glucose-Capillary 115 (*) 70 - 99 mg/dL   Comment 1 Notify RN    GLUCOSE, CAPILLARY     Status: Abnormal   Collection Time    09/03/14  4:19 PM      Result Value Ref Range   Glucose-Capillary 46 (*) 70 - 99 mg/dL   GLUCOSE, CAPILLARY     Status: None   Collection Time    09/03/14  4:38 PM      Result Value Ref Range   Glucose-Capillary 85  70 - 99 mg/dL  GLUCOSE, CAPILLARY     Status: Abnormal   Collection Time    09/03/14  8:50 PM      Result Value Ref Range   Glucose-Capillary 184 (*) 70 - 99 mg/dL  CREATININE, SERUM     Status: None   Collection Time    09/04/14  6:30 AM      Result Value Ref Range   Creatinine, Ser 0.75  0.50 - 1.10 mg/dL   GFR calc non Af Amer >90  >90 mL/min   GFR calc Af Amer >90  >90 mL/min   Comment: (NOTE)     The eGFR has been calculated using the CKD EPI equation.     This calculation has not been validated in all clinical situations.     eGFR's persistently <90 mL/min signify possible Chronic Kidney     Disease.  GLUCOSE, CAPILLARY     Status: Abnormal  Collection Time    09/04/14  7:56 AM      Result Value Ref Range   Glucose-Capillary 146 (*) 70 - 99 mg/dL   Comment 1 Notify RN    GLUCOSE, CAPILLARY     Status: Abnormal   Collection Time    09/04/14 11:24 AM      Result Value Ref Range   Glucose-Capillary 122 (*) 70 - 99 mg/dL   Comment 1 Notify RN    GLUCOSE, CAPILLARY     Status: Abnormal   Collection Time    09/04/14  4:26 PM      Result Value Ref Range   Glucose-Capillary 149 (*) 70 - 99 mg/dL  GLUCOSE, CAPILLARY     Status: Abnormal   Collection Time    09/04/14  9:22 PM      Result Value Ref Range   Glucose-Capillary 101 (*) 70 - 99 mg/dL  GLUCOSE, CAPILLARY     Status: None   Collection Time    09/05/14  6:43 AM      Result Value Ref Range   Glucose-Capillary 71  70 - 99 mg/dL     HEENT: normal Cardio: RRR and no murmur Resp: CTA B/L and unlabored GI: BS positive and NT,ND Extremity:  Pulses positive and No Edema Skin:  No breakdown Right thigh  leg or ankle Neuro: Alert/Oriented, Anxious and Abnormal Sensory decreased R> L ant thigh, leg and foot 5/5 in BUE 3- B HF, 4- KE 4 L ADF, 3-R ADF Musc/Skel:  Normal Gen  NAD   Assessment/Plan: 1. Functional deficits secondary to Lumbar stenosis with neurogenic claudication, s/p decompression and fusion which require 3+ hours per day of interdisciplinary therapy in a comprehensive inpatient rehab setting. Physiatrist is providing close team supervision and 24 hour management of active medical problems listed below. Physiatrist and rehab team continue to assess barriers to discharge/monitor patient progress toward functional and medical goals.  FIM: FIM - Bathing Bathing Steps Patient Completed: Chest;Right Arm;Left Arm;Abdomen;Left upper leg;Right upper leg;Buttocks;Front perineal area;Right lower leg (including foot);Left lower leg (including foot) Bathing: 5: Supervision: Safety issues/verbal cues   FIM - Upper Body Dressing/Undressing Upper body dressing/undressing steps patient completed: Thread/unthread right sleeve of pullover shirt/dresss;Thread/unthread left sleeve of pullover shirt/dress;Put head through opening of pull over shirt/dress;Pull shirt over trunk;Thread/unthread right bra strap;Thread/unthread left bra strap Upper body dressing/undressing: 5: Supervision: Safety issues/verbal cues FIM - Lower Body Dressing/Undressing Lower body dressing/undressing steps patient completed: Pull underwear up/down;Thread/unthread left underwear leg;Thread/unthread right underwear leg;Thread/unthread right pants leg;Thread/unthread left pants leg;Pull pants up/down;Don/Doff right sock;Don/Doff left sock;Don/Doff left shoe;Don/Doff right shoe Lower body dressing/undressing: 5: Supervision: Safety issues/verbal cues  FIM - Toileting Toileting steps completed by patient: Adjust clothing prior to toileting;Performs perineal hygiene;Adjust clothing after toileting Toileting Assistive Devices: Grab bar or rail for support Toileting: 0: Activity did not occur  FIM - Radio producer Devices: Product manager Transfers: 0-Activity did  not occur  FIM - Control and instrumentation engineer Devices: Copy: 5: Supine > Sit: Supervision (verbal cues/safety issues);5: Sit > Supine: Supervision (verbal cues/safety issues);5: Chair or W/C > Bed: Supervision (verbal cues/safety issues);5: Bed > Chair or W/C: Supervision (verbal cues/safety issues)  FIM - Locomotion: Wheelchair Distance: 150 Locomotion: Wheelchair: 5: Travels 150 ft or more: maneuvers on rugs and over door sills with supervision, cueing or coaxing FIM - Locomotion: Ambulation Locomotion: Ambulation Assistive Devices: Administrator Ambulation/Gait Assistance: 5: Supervision Locomotion: Ambulation: 2: Travels 50 - 149  ft with supervision/safety issues  Comprehension Comprehension Mode: Auditory Comprehension: 5-Follows basic conversation/direction: With no assist  Expression Expression Mode: Verbal Expression: 5-Expresses basic needs/ideas: With no assist  Social Interaction Social Interaction: 6-Interacts appropriately with others with medication or extra time (anti-anxiety, antidepressant).  Problem Solving Problem Solving: 5-Solves basic 90% of the time/requires cueing < 10% of the time  Memory Memory: 7-Complete Independence: No helper  Medical Problem List and Plan:  1. Functional deficits secondary to lumbar stenosis with neurogenic claudication  2. DVT Prophylaxis/Anticoagulation: Pharmaceutical: Lovenox  3. Pain Management: Continue neurontin increase to TID and trial ultram for pain management. Flexeril effective for spasms. Right foot pain likely due to nerve root lesion,  Neck pain muscular add sportscream 4. Mood: Mood stable and appears brighter today. LCSW to follow for evaluation and support.  5. Neuropsych: This patient is capable of making decisions on her own behalf.  6. Skin/Wound Care: Pressure relief measures--turn 2-3 hours at night to help with pressure and pain relief. . Monitor hydration and  nutritional status additionally.  7. Fluids/Electrolytes/Nutrition: Monitor I/O.   8. DM type 2 with diabetic neuropathy: Monitor blood sugars ac/hs.  Off Victoza at this time. Will decrease noon meal coverage to prevent hypoglycemic episodes. Looks good over  Last 24h 9. HTN: Monitor BP every 8 hours. Continue lisinopril, coreg and Norvasc.  10 Non-obstructive CAD: Resume ASA. Continue Coreg, Lipitor and Lisinopril  11. OSA: Continue CPAP with oxygen bled in.  12. Pulmonary HTN/ Asthma: Will encourage IS. Advair bid with duonebs prn SOB/wheezing.  13. ABLA: Improved past transfusion. Will check follow up labs in am.  14. . Hypokalemia: Likely dilutional from IVF. Will discontinue and encourage po intake. Check labs in am.  15. Dysuria: She reports that she completed doxycyline PTA. Will discontinue and check UA/UCS due to complaints of dysuria.    LOS (Days) 8 A FACE TO FACE EVALUATION WAS PERFORMED  KIRSTEINS,ANDREW E 09/05/2014, 7:38 AM

## 2014-09-05 NOTE — Progress Notes (Signed)
Occupational Therapy Discharge Summary and OT intervention sessions  Patient Details  Name: Kimberly [name] MRN: [id number] Date of Birth: [birth year]  Today's Date: 09/06/2014 OT Individual [phone] and [id number] OT Individual Time Calculation (min): 60 min and 60 min   Patient has met 10 of 10 long term goals due to improved activity tolerance, improved balance and ability to compensate for deficits.  Patient to discharge at overall Supervision level.  Patients family members unavailable for family education as of this date but is scheduled for education on 09/07/14 prior to discharge home.     Recommendation:  Patient will benefit from ongoing skilled OT services in outpatient setting to continue to advance functional skills in the area of BADL and iADL.  Equipment: recommended tub transfer bench  Reasons for discharge: treatment goals met  Patient/family agrees with progress made and goals achieved: Yes  OT intervention: Session 1: Pt seated in [place] upon arrival with 7/10 c/o pain and RN present to provide pain medications. Pt ambulated with RW to obtain all needed items for B& D session with supervision. Ambulated into bathroom to perform toilet transfer onto standard height toilet and toileting with supervision. Pt performed shower transfer onto TTB with supervision as well.  After bathing, pt ambulated to [facility] where she sat and performed all dressing tasks with use of AE as appropriate with supervision. Seated in WC at sink side and performed grooming with Mod I for increased time. Pt seated in WC with call bell and breakfast tray upon exiting the room.     Session 2: Upon entering the room, pt seated in wheelchair with 6/10 c/o pain. RN notified and medication given. Pt propelled wheelchair to ADL apartment. Pt ambulated with RW to sink and stood for ~ 15 minutes while washing dishes with supervision. Pt performing side stepping inside of walker to wash counter spaces as well.  Pt demonstrated ability to fix self snack during this session. Pt standing and obtaining needed items from drawers and cabinets while not breaking back precautions with supervision. Pt propelled WC ~ 250 + feet with B UEs onto elevator and into gift shop to inquire about prices regarding AE equipment needed for ADL tasks in order to increase I and maintain precautions. Pt demonstrated the ability to navigate WC throughout aisles and turn around in tight spaces. Pt propelled self back to room with supervision and remained seated in [facility] with call bell and all other needs within reach.   OT Discharge Precautions/Restrictions  Precautions Precautions: Fall;Back Precaution Booklet Issued: Yes (comment) Restrictions Weight Bearing Restrictions: No Pain Pain Assessment Pain Assessment: 0-10 Pain Score: 6  Pain Type: Acute pain;Surgical pain Pain Location: Back Pain Orientation: Lower;Mid Pain Descriptors / Indicators: Throbbing Pain Frequency: Constant Pain Onset: On-going Patients Stated Pain Goal: 3 Pain Intervention(s): Medication (See eMAR) Multiple Pain Sites: No Vision/Perception  Vision- History Baseline Vision/History: Wears glasses Wears Glasses: Reading only Patient Visual Report: No change from baseline Vision- Assessment Vision Assessment?: No apparent visual deficits  Cognition Overall Cognitive Status: History of cognitive impairments - at baseline Arousal/Alertness: Awake/alert Orientation Level: Oriented X4 Attention: Selective;Alternating Selective Attention: Appears intact Alternating Attention: Appears intact Memory: Impaired Memory Impairment: Decreased recall of new information Awareness: Appears intact Problem Solving: Appears intact Sensation Sensation Light Touch: Impaired Detail Light Touch Impaired Details: Impaired RLE Stereognosis: Not tested Hot/Cold: Appears Intact Proprioception: Appears Intact Additional Comments: report of "shocks" with LT to  RLE, more sensitive to cold on RLE Coordination Gross Motor Movements are  Fluid and Coordinated: Yes Fine Motor Movements are Fluid and Coordinated: Yes Motor  Motor Motor: Abnormal postural alignment and control Motor - Discharge Observations: decreased strength/sensation R LE Mobility  Bed Mobility Bed Mobility: Sit to Supine;Supine to Sit Rolling Right: 5: Supervision Supine to Sit: 6: Modified independent (Device/Increase time);HOB flat Sit to Supine: 6: Modified independent (Device/Increase time);HOB flat Transfers Transfers: Sit to Stand Sit to Stand: 5: Supervision Stand to Sit: 5: Supervision;With upper extremity assist;To chair/3-in-1;To bed  Trunk/Postural Assessment  Cervical Assessment Cervical Assessment: Exceptions to [facility] (forward head) Thoracic Assessment Thoracic Assessment: Exceptions to [facility] (rounded shoulders) Lumbar Assessment Lumbar Assessment: Exceptions to [facility] (posterior pelvic tilt) Postural Control Postural Control: Deficits on evaluation Postural Limitations: limited by RLE pain/weakness  Balance Balance Balance Assessed: Yes Static Sitting Balance Static Sitting - Balance Support: Feet supported Static Sitting - Level of Assistance: 6: Modified independent (Device/Increase time) Dynamic Sitting Balance Dynamic Sitting - Balance Support: Feet supported;During functional activity Dynamic Sitting - Level of Assistance: 5: Stand by assistance;6: Modified independent (Device/Increase time) Static Standing Balance Static Standing - Balance Support: No upper extremity supported Static Standing - Level of Assistance: 5: Stand by assistance Dynamic Standing Balance Dynamic Standing - Balance Support: During functional activity Dynamic Standing - Level of Assistance: 5: Stand by assistance Dynamic Standing - Comments: LB dressing, toileting, washing dishes, and simple meal prep with supervision Extremity/Trunk Assessment RUE Assessment RUE Assessment:  Within Functional Limits LUE Assessment LUE Assessment: Within Functional Limits  See FIM for current functional status  Phineas Semen 09/05/2014, 9:20 PM

## 2014-09-05 NOTE — Progress Notes (Signed)
Occupational Therapy Session Note  Patient Details  Name: Kimberly [name] MRN: [id number] Date of Birth: [birth year]  Today's Date: 09/05/2014 OT Individual Time: [id number] and 1300-1400 OT Individual Time Calculation (min): 60 min and 60 min    Short Term Goals: Week 1:  OT Short Term Goal 1 (Week 1): Pt will perform bathing with min A in order to increase I in self care. OT Short Term Goal 2 (Week 1): Pt will perform toileting with min A in order to increase I in self care. OT Short Term Goal 3 (Week 1): Pt will perform toilet transfer with min A in order to increase I in functional transfers. OT Short Term Goal 4 (Week 1): Pt will perform shower transfer with min A in order to increase I in functional transfers. OT Short Term Goal 5 (Week 1): Pt will tolerate 15 minutes of ADL session without requiring rest break in order to increase endurance.   Skilled Therapeutic Interventions/Progress Updates:  Session 1: Pt supine in bed upon entering the room with 6/10 c/o pain in lower back. Pt had fallen last night and reports that she fell out of wheelchair along with cushion. Therapist placed Dycem under cushion to keep it from sliding in the future.  Pt obtained all clothing and needed items for ADL session with supervision. Pt ambulated into bathroom with RW with supervision. Pt transferred onto TTB with supervision and use of RW. Shower performed with increased time and use of LH sponge with supervision for safety. Pt ambulated out of bathroom with RW to sit on wheelchair for dressing. Pt utilizing AE independently for dressing tasks in order to maintain back precautions. STS with supervision as well as supervision for dynamic standing balance during clothing management. Pt seated in wheelchair eating breakfast with call bell and all needed items within reach.   Session 2: Co -treat with recreational therapist. Pt seated in wheelchair upon entering the room, pt very excited about cooking session.  Pt with 7/10 c/o pain in lower back but reports medication recently given. Pt also reports, "I am feel sleepy like whoosie." Pt vitals checked with BP and glucose within therapeutic limits. Pt propelled wheelchair into ADL kitchen and stood with supervision to obtain items required for complex cooking task by reaching laterally and downward for items. Pt maintaining back precautions and explaining why she was unable to engage in certain tasks in order to maintain them. Pt cooking "taco bake" requiring her to cook on stove, in oven, and many steps involved thus making it complex in nature. Pt required increased rest breaks secondary to increased fatigue. Pt sitting on stool at times at stove in order to continue to observe cooking process. Pt requiring close supervision and min verbal cues for safety this session to remember to turn of oven. OT educated pt on kitchen set up in order to decrease fall risk and maintain precautions with pt verbalizing and demonstrating understanding. Pt required assist to remove pan from stove secondary to weight and precautions. Pt remained in kitchen with recreational therapist for clean up.   Therapy Documentation Precautions:  Precautions Precautions: Fall;Back Precaution Booklet Issued: Yes (comment) Precaution Comments: RLE buckling with WB Restrictions Weight Bearing Restrictions: No  See FIM for current functional status  Therapy/Group: Individual Therapy  Lowella Gripittman, Vivan Agostino L 09/05/2014, 12:23 PM

## 2014-09-05 NOTE — Progress Notes (Signed)
Recreational Therapy Session Note  Patient Details  Name: Kimberly [name] MRN: [id number] Date of Birth: December 29, 1955 Today's Date: 09/05/2014  Pain: c/o RLE pain, premedicated Skilled Therapeutic Interventions/Progress Updates: Session focused on activity tolerance, functional mobility in kitchen setting, dynamic standing balance, problem solving, safety awareness, energy conservation techniques & adherence to precautions.  Pt performed meal prep activity making Taco Bake standing using RW with close supervision & min cues for technique & safety.  Pt utilized stool for energy conservation throughout activity.  Discussion with pt about delegating responsibilities to other members within the household, organizing steps and spacing them out to assist with energy level as well as preparing larger portions and freezing items for future use.  Pt stated understanding & appreciation of the practice.  Therapy/Group: Co-Treatment   Chanc Kervin 09/05/2014, 3:04 PM

## 2014-09-06 ENCOUNTER — Inpatient Hospital Stay (HOSPITAL_COMMUNITY): Payer: Medicare Other | Admitting: Occupational Therapy

## 2014-09-06 ENCOUNTER — Encounter (HOSPITAL_COMMUNITY): Payer: Medicare Other | Admitting: Speech Pathology

## 2014-09-06 ENCOUNTER — Inpatient Hospital Stay (HOSPITAL_COMMUNITY): Payer: Medicare Other | Admitting: Physical Therapy

## 2014-09-06 LAB — GLUCOSE, CAPILLARY
GLUCOSE-CAPILLARY: 100 mg/dL — AB (ref 70–99)
GLUCOSE-CAPILLARY: 137 mg/dL — AB (ref 70–99)
GLUCOSE-CAPILLARY: 134 mg/dL — AB (ref 70–99)
Glucose-Capillary: 93 mg/dL (ref 70–99)

## 2014-09-06 MED ORDER — CYCLOBENZAPRINE HCL 10 MG PO TABS: 10.0000 mg | ORAL_TABLET | Freq: Three times a day (TID) | ORAL | Status: DC | PRN

## 2014-09-06 MED ORDER — INSULIN GLARGINE 100 UNIT/ML ~~LOC~~ SOLN
SUBCUTANEOUS | Status: DC
Start: 2014-09-06 — End: 2017-04-21

## 2014-09-06 MED ORDER — TRAMADOL HCL 50 MG PO TABS: 50.0000 mg | ORAL_TABLET | Freq: Four times a day (QID) | ORAL | Status: DC | PRN

## 2014-09-06 MED ORDER — GABAPENTIN 300 MG PO CAPS: 300.0000 mg | ORAL_CAPSULE | Freq: Three times a day (TID) | ORAL | Status: DC

## 2014-09-06 MED ORDER — MUSCLE RUB 10-15 % EX CREA: 1.0000 "application " | TOPICAL_CREAM | CUTANEOUS | Status: DC | PRN

## 2014-09-06 MED ORDER — INSULIN ASPART 100 UNIT/ML ~~LOC~~ SOLN
SUBCUTANEOUS | Status: DC
Start: 2014-09-06 — End: 2016-08-04

## 2014-09-06 MED ORDER — GABAPENTIN 300 MG PO CAPS
300.0000 mg | ORAL_CAPSULE | Freq: Three times a day (TID) | ORAL | Status: DC
  Administered 2014-09-06 – 2014-09-07 (×4): 300 mg via ORAL
  Filled 2014-09-06 (×7): qty 1

## 2014-09-06 MED ORDER — SENNA 8.6 MG PO TABS: 1.0000 | ORAL_TABLET | Freq: Two times a day (BID) | ORAL | Status: DC

## 2014-09-06 MED ORDER — ACETAMINOPHEN 325 MG PO TABS: 325.0000 mg | ORAL_TABLET | ORAL | Status: DC | PRN

## 2014-09-06 NOTE — Progress Notes (Signed)
Nursing Note: Pt called for bathroom assist and toileted , then remained in w/c.Pt stated she was having some pain and just very restless in bed and decided to stay up.Pt medicated w/ tylenol and up in w/c at the bedside. Call bell and personal items all in reach.wbb

## 2014-09-06 NOTE — Progress Notes (Signed)
Subjective/Complaints: 58 y.o. 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 Patient elected to undergo L2-3 decompressive lam with repair of complex dural laceration on 08/23/14 by Dr. Annette Stable. Post op on bedrest till 10/05 and patient with reports of RLE numbness and heaviness. Patient continues to be limited by RLE instability requiring blocking to prevent buckling with transfer.   Still with tingling, burning, numbness and tightness feeling RLE  Review of Systems - Negative except burning pain R>L thigh Objective: Vital Signs: Blood pressure 117/64, pulse 58, temperature 97.7 F (36.5 C), temperature source Oral, resp. rate 18, height 5' (1.524 m), weight 107.276 kg (236 lb 8 oz), SpO2 100.00%. No results found. Results for orders placed during the hospital encounter of 08/28/14 (from the past 72 hour(s))  GLUCOSE, CAPILLARY     Status: None   Collection Time    09/03/14  6:56 AM      Result Value Ref Range   Glucose-Capillary 92  70 - 99 mg/dL  GLUCOSE, CAPILLARY     Status: Abnormal   Collection Time    09/03/14 11:19 AM      Result Value Ref Range   Glucose-Capillary 115 (*) 70 - 99 mg/dL   Comment 1 Notify RN    GLUCOSE, CAPILLARY     Status: Abnormal   Collection Time    09/03/14  4:19 PM      Result Value Ref Range   Glucose-Capillary 46 (*) 70 - 99 mg/dL  GLUCOSE, CAPILLARY     Status: None   Collection Time    09/03/14  4:38 PM      Result Value Ref Range   Glucose-Capillary 85  70 - 99 mg/dL  GLUCOSE, CAPILLARY     Status: Abnormal   Collection Time    09/03/14  8:50 PM      Result Value Ref Range   Glucose-Capillary 184 (*) 70 - 99 mg/dL  CREATININE, SERUM     Status: None   Collection Time    09/04/14  6:30 AM      Result Value Ref Range   Creatinine, Ser 0.75  0.50 - 1.10 mg/dL   GFR calc non Af Amer >90  >90 mL/min   GFR calc Af Amer >90  >90 mL/min    Comment: (NOTE)     The eGFR has been calculated using the CKD EPI equation.     This calculation has not been validated in all clinical situations.     eGFR's persistently <90 mL/min signify possible Chronic Kidney     Disease.  GLUCOSE, CAPILLARY     Status: Abnormal   Collection Time    09/04/14  7:56 AM      Result Value Ref Range   Glucose-Capillary 146 (*) 70 - 99 mg/dL   Comment 1 Notify RN    GLUCOSE, CAPILLARY     Status: Abnormal   Collection Time    09/04/14 11:24 AM      Result Value Ref Range   Glucose-Capillary 122 (*) 70 - 99 mg/dL   Comment 1 Notify RN    GLUCOSE, CAPILLARY     Status: Abnormal   Collection Time    09/04/14  4:26 PM      Result Value Ref Range   Glucose-Capillary 149 (*) 70 - 99 mg/dL  GLUCOSE, CAPILLARY     Status: Abnormal   Collection Time  09/04/14  9:22 PM      Result Value Ref Range   Glucose-Capillary 101 (*) 70 - 99 mg/dL  GLUCOSE, CAPILLARY     Status: None   Collection Time    09/05/14  6:43 AM      Result Value Ref Range   Glucose-Capillary 71  70 - 99 mg/dL  GLUCOSE, CAPILLARY     Status: Abnormal   Collection Time    09/05/14 11:27 AM      Result Value Ref Range   Glucose-Capillary 122 (*) 70 - 99 mg/dL  GLUCOSE, CAPILLARY     Status: Abnormal   Collection Time    09/05/14  1:12 PM      Result Value Ref Range   Glucose-Capillary 156 (*) 70 - 99 mg/dL  GLUCOSE, CAPILLARY     Status: Abnormal   Collection Time    09/05/14  4:33 PM      Result Value Ref Range   Glucose-Capillary 122 (*) 70 - 99 mg/dL  GLUCOSE, CAPILLARY     Status: Abnormal   Collection Time    09/05/14  9:18 PM      Result Value Ref Range   Glucose-Capillary 111 (*) 70 - 99 mg/dL     HEENT: normal Cardio: RRR and no murmur Resp: CTA B/L and unlabored GI: BS positive and NT,ND Extremity:  Pulses positive and 1+ pedal Edema Skin:  No breakdown Right thigh  leg or ankle Neuro: Alert/Oriented, Anxious and Abnormal Sensory decreased R> L ant thigh,  leg and foot 5/5 in BUE 3- B HF, 4- KE 4 L ADF, 3-R ADF Musc/Skel:  Normal Gen NAD   Assessment/Plan: 1. Functional deficits secondary to Lumbar stenosis with neurogenic claudication, s/p decompression and fusion which require 3+ hours per day of interdisciplinary therapy in a comprehensive inpatient rehab setting. Physiatrist is providing close team supervision and 24 hour management of active medical problems listed below. Physiatrist and rehab team continue to assess barriers to discharge/monitor patient progress toward functional and medical goals. Plan D/C in am FIM: FIM - Bathing Bathing Steps Patient Completed: Chest;Right Arm;Left Arm;Abdomen;Left upper leg;Right upper leg;Buttocks;Front perineal area;Right lower leg (including foot);Left lower leg (including foot) Bathing: 5: Supervision: Safety issues/verbal cues   FIM - Upper Body Dressing/Undressing Upper body dressing/undressing steps patient completed: Thread/unthread right sleeve of pullover shirt/dresss;Thread/unthread left sleeve of pullover shirt/dress;Put head through opening of pull over shirt/dress;Pull shirt over trunk;Thread/unthread right bra strap;Thread/unthread left bra strap Upper body dressing/undressing: 5: Supervision: Safety issues/verbal cues FIM - Lower Body Dressing/Undressing Lower body dressing/undressing steps patient completed: Pull underwear up/down;Thread/unthread left underwear leg;Thread/unthread right underwear leg;Thread/unthread right pants leg;Thread/unthread left pants leg;Pull pants up/down;Don/Doff right sock;Don/Doff left sock;Don/Doff left shoe;Don/Doff right shoe Lower body dressing/undressing: 5: Supervision: Safety issues/verbal cues  FIM - Toileting Toileting steps completed by patient: Adjust clothing prior to toileting;Performs perineal hygiene;Adjust clothing after toileting Toileting Assistive Devices: Toilet Aid/prosthesis/orthosis Toileting: 5: Supervision: Safety issues/verbal  cues  FIM - Radio producer Devices: Grab bars Toilet Transfers: 5-To toilet/BSC: Supervision (verbal cues/safety issues);5-From toilet/BSC: Supervision (verbal cues/safety issues)  FIM - Control and instrumentation engineer Devices: Copy: 5: Supine > Sit: Supervision (verbal cues/safety issues);5: Sit > Supine: Supervision (verbal cues/safety issues);5: Chair or W/C > Bed: Supervision (verbal cues/safety issues);5: Bed > Chair or W/C: Supervision (verbal cues/safety issues)  FIM - Locomotion: Wheelchair Distance: 100 Locomotion: Wheelchair: 2: Travels 50 - 149 ft with supervision, cueing or coaxing FIM - Locomotion: Ambulation Locomotion:  Ambulation Assistive Devices: Walker - Rolling Ambulation/Gait Assistance: 5: Supervision Locomotion: Ambulation: 2: Travels 17 - 149 ft with supervision/safety issues  Comprehension Comprehension Mode: Auditory Comprehension: 5-Follows basic conversation/direction: With no assist  Expression Expression Mode: Verbal Expression: 5-Expresses basic needs/ideas: With no assist  Social Interaction Social Interaction: 6-Interacts appropriately with others with medication or extra time (anti-anxiety, antidepressant).  Problem Solving Problem Solving: 5-Solves basic problems: With no assist  Memory Memory: 7-Complete Independence: No helper  Medical Problem List and Plan:  1. Functional deficits secondary to lumbar stenosis with neurogenic claudication  2. DVT Prophylaxis/Anticoagulation: Pharmaceutical: Lovenox  3. Pain Management: Continue neurontin increase to TID and trial ultram for pain management. Flexeril effective for spasms. Right foot pain likely due to nerve root lesion,  Neck pain muscular add sportscream 4. Mood: Mood stable and appears brighter today. LCSW to follow for evaluation and support.  5. Neuropsych: This patient is capable of making decisions on her own behalf.   6. Skin/Wound Care: Pressure relief measures--turn 2-3 hours at night to help with pressure and pain relief. . Monitor hydration and nutritional status additionally.  7. Fluids/Electrolytes/Nutrition: Monitor I/O.   8. DM type 2 with diabetic neuropathy: Monitor blood sugars ac/hs.  Off Victoza at this time. Will decrease noon meal coverage to prevent hypoglycemic episodes. Looks good over  Last 24h 9. HTN: Monitor BP every 8 hours. Continue lisinopril, coreg and Norvasc.  10 Non-obstructive CAD: Resume ASA. Continue Coreg, Lipitor and Lisinopril  11. OSA: Continue CPAP with oxygen bled in.  12. Pulmonary HTN/ Asthma: Will encourage IS. Advair bid with duonebs prn SOB/wheezing.  13. ABLA: Improved past transfusion. Will check follow up labs in am.  14. . Hypokalemia: Likely dilutional from IVF. Will discontinue and encourage po intake. Check labs in am.  15. Dysuria: She reports that she completed doxycyline PTA. Will discontinue and check UA/UCS due to complaints of dysuria.    LOS (Days) 9 A FACE TO FACE EVALUATION WAS PERFORMED  KIRSTEINS,ANDREW E 09/06/2014, 6:25 AM

## 2014-09-06 NOTE — Plan of Care (Signed)
Problem: RH PAIN MANAGEMENT Goal: RH STG PAIN MANAGED AT OR BELOW PT'S PAIN GOAL Less than 3 on 0-10 scale  Outcome: Not Progressing Consistently rates pain 7-8/10 in back radiating down to right leg

## 2014-09-06 NOTE — Progress Notes (Addendum)
Physical Therapy Discharge Summary  Patient Details  Name: AIVY AKTER MRN: [id number] Date of Birth: 1956/06/26  Today's Date: 09/06/2014 PT Individual Time: 1440-1540 PT Individual Time Calculation (min): 60 min    Patient has met 8 of 11 long term goals due to improved activity tolerance, improved balance, improved postural control, increased strength, ability to compensate for deficits and functional use of  right lower extremity.  Patient to discharge at a household ambulatory level Supervision. Patient's care partner unavailable to provide the necessary physical assistance at discharge.  Reasons goals not met: Pt continues to require supervision for w/c propulsion and assist for w/c parts management due to back precautions.   Recommendation:  Patient will benefit from ongoing skilled PT services in home health setting to continue to advance safe functional mobility, address ongoing impairments in balance, strength, functional endurance, and minimize fall risk.  Equipment: wheelchair and RW  Reasons for discharge: treatment goals met and discharge from hospital  Patient/family agrees with progress made and goals achieved: Yes  PT Discharge Skilled Therapeutic Interventions Pt received sitting in w/c, agreeable to therapy. Patient's DME arriving and adjusted to optimally fit patient. Patient educated on w/c parts and management. Pt at overall supervision level for multiple bouts of short distance ambulation up to 75 ft using RW in home and controlled environments, stair negotiation with 2 rails with step-to pattern and curb step negotiation using RW (ascending backwards, descending forwards), w/c propulsion using BUEs with assist for leg rest management due to back precautions, car transfer to SUV height, bed <> chair transfers, and standing balance with BUE support. Pt performed bed mobility on flat bed in ADL apartment with mod I. Reviewed HEP with no further questions regarding  discharge. Patient left sitting in w/c with all needs within reach.     Precautions/Restrictions Precautions Precautions: Fall;Back Precaution Booklet Issued: Yes (comment) Restrictions Weight Bearing Restrictions: No Vital Signs Therapy Vitals Temp: 97.5 F (36.4 C) Temp Source: Oral Pulse Rate: 61 Resp: 17 BP: 132/57 mmHg Patient Position (if appropriate): Sitting Oxygen Therapy SpO2: 99 % O2 Device: None (Room air) Pain Pain Assessment Pain Assessment: 0-10 Pain Score: 6  Pain Type: Acute pain Pain Location: Back Pain Orientation: Mid;Lower Pain Descriptors / Indicators: Throbbing Pain Frequency: Constant Pain Onset: On-going Patients Stated Pain Goal: 3 Pain Intervention(s): Medication (See eMAR) Vision/Perception   No changes from baseline  Cognition Orientation Level: Oriented X4 Sensation Sensation Light Touch: Impaired Detail Light Touch Impaired Details: Impaired RLE Stereognosis: Not tested Hot/Cold: Appears Intact Proprioception: Appears Intact Additional Comments: report of "shocks" with LT to RLE, more sensitive to cold on RLE Coordination Gross Motor Movements are Fluid and Coordinated: No Fine Motor Movements are Fluid and Coordinated: Yes Motor  Motor Motor: Abnormal postural alignment and control Motor - Discharge Observations: Decreased strength/sensation RLE  Mobility Bed Mobility Bed Mobility: Sit to Supine;Supine to Sit Supine to Sit: 6: Modified independent (Device/Increase time);HOB flat Sit to Supine: 6: Modified independent (Device/Increase time);HOB flat Transfers Transfers: Yes Sit to Stand: 5: Supervision;With upper extremity assist;From chair/3-in-1;From bed Stand to Sit: 5: Supervision;With upper extremity assist;To chair/3-in-1;To bed Stand Pivot Transfers: 5: Supervision (using RW) Locomotion  Ambulation Ambulation: Yes Ambulation/Gait Assistance: 5: Supervision Ambulation Distance (Feet): 75 Feet Assistive device:  Rolling walker Gait Gait: Yes Gait Pattern: Impaired Gait Pattern: Step-through pattern;Step-to pattern;Decreased step length - left;Decreased stance time - right;Narrow base of support;Trunk flexed;Decreased weight shift to right Gait velocity: decreased High Level Ambulation High Level Ambulation: Side stepping  Side Stepping: Supervision Stairs / Additional Locomotion Stairs: Yes Stairs Assistance: 5: Supervision Stair Management Technique: Two rails;Step to pattern;Forwards Number of Stairs: 5 Height of Stairs: 6 Curb: 5: Supervision;4: Min assist (using RW ascending backwards, descending forwards) Architect: Yes Wheelchair Assistance: 5: Careers information officer: Both upper extremities Wheelchair Parts Management: Needs assistance (due to back precautions) Distance: 150  Trunk/Postural Assessment  Cervical Assessment Cervical Assessment: Exceptions to [facility] (forward head) Thoracic Assessment Thoracic Assessment: Exceptions to [facility] (rounded shoulder) Lumbar Assessment Lumbar Assessment: Exceptions to [facility] (posterior pelvic tilt) Postural Control Postural Control: Deficits on evaluation Postural Limitations: limited by RLE pain/weakness  Balance Balance Balance Assessed: Yes Static Sitting Balance Static Sitting - Balance Support: Feet supported Static Sitting - Level of Assistance: 6: Modified independent (Device/Increase time) Dynamic Sitting Balance Dynamic Sitting - Balance Support: Feet supported;During functional activity Dynamic Sitting - Level of Assistance: 5: Stand by assistance;6: Modified independent (Device/Increase time) Static Standing Balance Static Standing - Balance Support: No upper extremity supported  Static Standing - Level of Assistance: 5: Stand by assistance Dynamic Standing Balance Dynamic Standing - Balance Support: During functional activity;Bilateral upper extremity supported Dynamic Standing - Level of  Assistance: 5: Stand by assistance Extremity Assessment  RUE Assessment RUE Assessment: Within Functional Limits LUE Assessment LUE Assessment: Within Functional Limits RLE Assessment RLE Assessment: Exceptions to [facility] RLE Strength Right Hip Flexion: 3-/5 Right Knee Flexion: 3/5 Right Knee Extension: 3+/5 Right Ankle Dorsiflexion: 2-/5 Right Ankle Plantar Flexion: 2+/5 LLE Assessment LLE Assessment: Within Functional Limits  See FIM for current functional status  Chrishawn, Kring 09/06/2014, 3:26 PM

## 2014-09-06 NOTE — Plan of Care (Signed)
Problem: RH SKIN INTEGRITY Goal: RH STG ABLE TO PERFORM INCISION/WOUND CARE W/ASSISTANCE STG Able To Perform Incision/Wound Care With Total Assistance.  Outcome: Not Applicable Date Met:  94/17/91 Staff provides dressing change and care to mid-back incision.

## 2014-09-06 NOTE — Progress Notes (Signed)
Social Work Discharge Note Discharge Note  The overall goal for the admission was met for:   Discharge location: [place]  Length of Stay: Yes-10 DAYS  Discharge activity level: Yes-SUPERVISION LEVEL  Home/community participation: Yes  Services provided included: MD, RD, PT, OT, SLP, RN, CM, TR, Pharmacy, Neuropsych and SW  Financial Services: Private Insurance: [employer]  Follow-up services arranged: Home Health: [place] CARE-PT,OT,RN, DME: [place] and Patient/Family has no preference for HH/DME agencies  Comments (or additional information):PT DID WELL HERE [place].  BETWEEN HUSBAND AND FRIEND WILL HAVE 24 HR CARE  Patient/Family verbalized understanding of follow-up arrangements: Yes  Individual responsible for coordination of the follow-up plan: SELF & JERRY-HUSBAND  Confirmed correct DME delivered: [place], [place] 09/06/2014    Elease Hashimoto

## 2014-09-06 NOTE — Progress Notes (Signed)
Social Work Patient ID: Kimberly [name], female   DOB: 21-Dec-1955, 58 y.o.   MRN: [id number] Met with pt who feels ready for discharge tomorrow, she is grateful for all of the therapy while here.  Her husband to be here at 11;00 tomorrow. Equipment and follow up arranged.  Set for discharge tomorrow.

## 2014-09-06 NOTE — Discharge Summary (Signed)
Physician Discharge Summary  Patient ID: Kimberly CamelRebecca P Ginther MRN: [id number] DOB/AGE: 03-11-1956 58 y.o.  Admit date: 08/28/2014 Discharge date: 09/07/2014  Discharge Diagnoses:  Principal Problem:   Lumbar stenosis with neurogenic claudication Active Problems:   Diabetes   Essential hypertension   Obstructive sleep apnea   Nerve pain   Discharged Condition: Stable.   Significant Diagnostic Studies: Dg Lumbar Spine 1 View  08/23/2014   CLINICAL DATA:  Lumbar stenosis.  Laminectomy.  EXAM: LUMBAR SPINE - 1 VIEW  COMPARISON:  MRI of July 04, 2014.  FINDINGS: Single lateral intraoperative projection of the cervical spine demonstrates surgical probe directed toward the posterior elements of L3. Surgical retractor is seen posterior to this area is well.  IMPRESSION: Surgical localization as described above.   Electronically Signed   By: Roque LiasJames  Green M.D.   On: 08/23/2014 08:52    Labs:  Basic Metabolic Panel:  Recent Labs Lab 09/01/14 0950 09/04/14 0630  NA 137  --   K 4.1  --   CL 101  --   CO2 27  --   GLUCOSE 200*  --   BUN 12  --   CREATININE 0.76 0.75  CALCIUM 8.9  --     CBC: No results found for this basename: WBC, NEUTROABS, HGB, HCT, MCV, PLT,  in the last 168 hours  CBG:  Recent Labs Lab 09/05/14 1312 09/05/14 1633 09/05/14 2118 09/06/14 0702 09/06/14 1141  GLUCAP 156* 122* 111* 93 100*    Brief HPI:   Kimberly [name] is a 58 y.o. 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 Patient elected to undergo L2-3 decompressive lam with repair of complex dural laceration on 08/23/14 by Dr. Jordan LikesPool. Post op on bedrest till 10/05 and patient with reports of RLE numbness and heaviness. Patient continuesdo be limited by RLE instability requiring blocking to prevent buckling with transfer. CIR recommended for follow up therapy.    Hospital Course: Kimberly CamelRebecca P  Jablon was admitted to rehab 08/28/2014 for inpatient therapies to consist of PT and OT at least three hours five days a week. Past admission physiatrist, therapy team and rehab RN have worked together to provide customized collaborative inpatient rehab.  Blood sugars were monitored on ac/hs basis and patient was noted to have hypoglycemic episodes with blood sugars down to 40's and 70's on couple of occasions. Therefore evening dose of Lantus as well as meal coverage were adjusted to prevent recurrent episodes.  Blood sugars are reasonably controlled at this time and she is to follow up with PMD for further adjustment past discharge. Po intake has been good and BS have been reasonably controlled. She has had Back incision has been monitored daily and continues to have min to moderate amount of serous drainage. Incision is intact with sutures in place and HHRN to follow up for wound monitoring.  She is to follow up with Dr. Jordan LikesPool for suture removal. Follow up labs reveal that ABLA is stable overall and urine culture was negative. She has had lethargy with use of oxycodone as well as hydrocodone therefore narcotics were discontinued. Tramadol has been used with reasonable pain relief and no sedative SE.  She has had complaints of neuropathy and gabapentin was titrated to 300 mg tid. She has made steady progress during her rehab stay and is at supervision level overall. She will continue to receive HHPT, HHOT and HHRN  by [facility] past discharge.    Rehab course: During patient's stay in rehab weekly team conferences were held to monitor patient's progress, set goals and discuss barriers to discharge. Patient has had improvement in activity tolerance, balance, postural control, as well as ability to compensate for deficits. She requires supervision for bathing and dressing tasks with use of AE and occasional cues. She is requires supervision for transfers and is able to ambulate 75 feet with  supervision.   Disposition: Home   Diet: Diabetic.   Special Instructions: 1. Contact Dr. Jordan Likes if you develop redness, increase in pain, swelling or drainage from wound, fevers or chills. 2. Check blood sugars before meals and at bedtime. Follow up with Dr. Sherril Croon for adjustment of insulin.      Medication List    STOP taking these medications       doxycycline 100 MG tablet  Commonly known as:  ADOXA      TAKE these medications       acetaminophen 325 MG tablet  Commonly known as:  TYLENOL  Take 1-2 tablets (325-650 mg total) by mouth every 4 (four) hours as needed for mild pain.     ADVAIR DISKUS 500-50 MCG/DOSE Aepb  Generic drug:  Fluticasone-Salmeterol  Inhale 2 puffs into the lungs 2 (two) times daily.     amLODipine 2.5 MG tablet  Commonly known as:  NORVASC  Take 1 tablet (2.5 mg total) by mouth daily.     aspirin EC 81 MG tablet  Take 81 mg by mouth daily.     atorvastatin 10 MG tablet  Commonly known as:  LIPITOR  Take 10 mg by mouth daily.     carvedilol 12.5 MG tablet  Commonly known as:  COREG  Take 12.5 mg by mouth 2 (two) times daily with a meal.     conjugated estrogens vaginal cream  Commonly known as:  PREMARIN  Place 1 Applicatorful vaginally at bedtime.     cyclobenzaprine 10 MG tablet  Commonly known as:  FLEXERIL  Take 1 tablet (10 mg total) by mouth 3 (three) times daily as needed for muscle spasms.     DUONEB 0.5-2.5 (3) MG/3ML Soln  Generic drug:  ipratropium-albuterol  Take 3 mLs by nebulization daily as needed (for shortness of breath).     famotidine 40 MG tablet  Commonly known as:  PEPCID  Take 40 mg by mouth daily.     furosemide 20 MG tablet  Commonly known as:  LASIX  Take 20-40 mg by mouth daily as needed for fluid.     gabapentin 300 MG capsule  Commonly known as:  NEURONTIN  Take 1 capsule (300 mg total) by mouth 3 (three) times daily.     HYDROcodone-acetaminophen 7.5-325 MG per tablet  Commonly known as:  NORCO   Take 1 tablet by mouth daily as needed for moderate pain.     insulin aspart 100 UNIT/ML injection  Commonly known as:  novoLOG  10 units at lunch, 25 units at supper. Then sliding scale as needed.     insulin glargine 100 UNIT/ML injection  Commonly known as:  LANTUS  30 @ bedtime, 25 in the morning     Levothyroxine Sodium 75 MCG Caps  Take 1 capsule by mouth daily.     lisinopril 10 MG tablet  Commonly known as:  PRINIVIL,ZESTRIL  Take 1 tablet (10 mg total) by mouth daily.     MUSCLE RUB 10-15 % Crea  Apply 1  application topically as needed for muscle pain.     NON FORMULARY  - CPAP: use as directed  -      OXYGEN  Inhale 2.5 L into the lungs at bedtime. OXYGEN 2L: use as directed with cpap     pantoprazole 40 MG tablet  Commonly known as:  PROTONIX  Take 1 tablet (40 mg total) by mouth 2 (two) times daily.     rOPINIRole 1 MG tablet  Commonly known as:  REQUIP  Take 1 mg by mouth at bedtime.     senna 8.6 MG Tabs tablet  Commonly known as:  SENOKOT  Take 1 tablet (8.6 mg total) by mouth 2 (two) times daily. For constipation     THERA TEARS ALLERGY 0.025 % ophthalmic solution  Generic drug:  ketotifen  Place 2 drops into both eyes 2 (two) times daily. *Uses drops 2 to 4 times daily*     traMADol 50 MG tablet  Commonly known as:  ULTRAM  Take 1 tablet (50 mg total) by mouth every 6 (six) hours as needed for moderate pain or severe pain.     VENTOLIN HFA 108 (90 BASE) MCG/ACT inhaler  Generic drug:  albuterol  Inhale 2 puffs into the lungs 2 (two) times daily.     VIACTIV 500-500-40 MG-UNT-MCG Chew  Generic drug:  Calcium-Vitamin D-Vitamin K  Chew 1 capsule by mouth daily.     VICTOZA 18 MG/3ML Sopn  Generic drug:  Liraglutide  Inject 1.8 mg into the skin daily.           Follow-up Information   Follow up with Erick ColaceKIRSTEINS,ANDREW E, MD On 10/01/2014. (Be there at 9:30  for 10 am  appointment)    Specialty:  Physical Medicine and Rehabilitation    Contact information:   [address] [place] [address] [phone]       Follow up with Temple PaciniPOOL,HENRY A, MD. Call today. (for post op check. )    Specialty:  Neurosurgery   Contact information:   [address] [place] [address] [phone]       Follow up with VYAS,DHRUV B., MD. Call today. (for post hospital follow up in two weeks. )    Specialty:  Internal Medicine   Contact information:   [address] [place] [address] [phone]       Signed: Jacquelynn CreeLove, Leemon Ayala S 09/06/2014, 2:20 PM

## 2014-09-07 DIAGNOSIS — I5032 Chronic diastolic (congestive) heart failure: Secondary | ICD-10-CM

## 2014-09-07 DIAGNOSIS — M47817 Spondylosis without myelopathy or radiculopathy, lumbosacral region: Secondary | ICD-10-CM

## 2014-09-07 LAB — GLUCOSE, CAPILLARY
Glucose-Capillary: 94 mg/dL (ref 70–99)
Glucose-Capillary: 118 mg/dL — ABNORMAL HIGH (ref 70–99)

## 2014-09-07 NOTE — Progress Notes (Signed)
Patient discharge to home. Left unit around 1230 pm with family.

## 2014-09-07 NOTE — Progress Notes (Signed)
Subjective/Complaints: 58 y.o. 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 Patient elected to undergo L2-3 decompressive lam with repair of complex dural laceration on 08/23/14 by Dr. Annette Stable. Post op on bedrest till 10/05 and patient with reports of RLE numbness and heaviness. Patient continues to be limited by RLE instability requiring blocking to prevent buckling with transfer.    No issues overnite, nervous about D/C  Review of Systems - Negative except burning pain R>L thigh Objective: Vital Signs: Blood pressure 116/60, pulse 60, temperature 97.9 F (36.6 C), temperature source Oral, resp. rate 20, height 5' (1.524 m), weight 107.276 kg (236 lb 8 oz), SpO2 95.00%. No results found. Results for orders placed during the hospital encounter of 08/28/14 (from the past 72 hour(s))  GLUCOSE, CAPILLARY     Status: Abnormal   Collection Time    09/04/14  7:56 AM      Result Value Ref Range   Glucose-Capillary 146 (*) 70 - 99 mg/dL   Comment 1 Notify RN    GLUCOSE, CAPILLARY     Status: Abnormal   Collection Time    09/04/14 11:24 AM      Result Value Ref Range   Glucose-Capillary 122 (*) 70 - 99 mg/dL   Comment 1 Notify RN    GLUCOSE, CAPILLARY     Status: Abnormal   Collection Time    09/04/14  4:26 PM      Result Value Ref Range   Glucose-Capillary 149 (*) 70 - 99 mg/dL  GLUCOSE, CAPILLARY     Status: Abnormal   Collection Time    09/04/14  9:22 PM      Result Value Ref Range   Glucose-Capillary 101 (*) 70 - 99 mg/dL  GLUCOSE, CAPILLARY     Status: None   Collection Time    09/05/14  6:43 AM      Result Value Ref Range   Glucose-Capillary 71  70 - 99 mg/dL  GLUCOSE, CAPILLARY     Status: Abnormal   Collection Time    09/05/14 11:27 AM      Result Value Ref Range   Glucose-Capillary 122 (*) 70 - 99 mg/dL  GLUCOSE, CAPILLARY     Status: Abnormal   Collection Time     09/05/14  1:12 PM      Result Value Ref Range   Glucose-Capillary 156 (*) 70 - 99 mg/dL  GLUCOSE, CAPILLARY     Status: Abnormal   Collection Time    09/05/14  4:33 PM      Result Value Ref Range   Glucose-Capillary 122 (*) 70 - 99 mg/dL  GLUCOSE, CAPILLARY     Status: Abnormal   Collection Time    09/05/14  9:18 PM      Result Value Ref Range   Glucose-Capillary 111 (*) 70 - 99 mg/dL  GLUCOSE, CAPILLARY     Status: None   Collection Time    09/06/14  7:02 AM      Result Value Ref Range   Glucose-Capillary 93  70 - 99 mg/dL  GLUCOSE, CAPILLARY     Status: Abnormal   Collection Time    09/06/14 11:41 AM      Result Value Ref Range   Glucose-Capillary 100 (*) 70 - 99 mg/dL   Comment 1 Notify RN    GLUCOSE, CAPILLARY     Status: Abnormal   Collection Time  09/06/14  4:23 PM      Result Value Ref Range   Glucose-Capillary 137 (*) 70 - 99 mg/dL   Comment 1 Notify RN    GLUCOSE, CAPILLARY     Status: Abnormal   Collection Time    09/06/14  8:56 PM      Result Value Ref Range   Glucose-Capillary 134 (*) 70 - 99 mg/dL  GLUCOSE, CAPILLARY     Status: None   Collection Time    09/07/14  7:16 AM      Result Value Ref Range   Glucose-Capillary 94  70 - 99 mg/dL   Comment 1 Notify RN       HEENT: normal Cardio: RRR and no murmur Resp: CTA B/L and unlabored GI: BS positive and NT,ND Extremity:  Pulses positive and 1+ pedal Edema Skin:  No breakdown Right thigh  leg or ankle Neuro: Alert/Oriented, Anxious and Abnormal Sensory decreased R> L ant thigh, leg and foot 5/5 in BUE 3- B HF, 4- KE 4 L ADF, 3-R ADF Musc/Skel:  Normal Gen NAD   Assessment/Plan: 1. Functional deficits secondary to Lumbar stenosis with neurogenic claudication, s/p decompression and fusion Stable for D/C today F/u PCP in 1-2 weeks F/u PM&R 3 weeks See D/C summary See D/C instructions FIM - Bathing Bathing Steps Patient Completed: Chest;Right Arm;Left Arm;Abdomen;Left upper leg;Right upper  leg;Buttocks;Front perineal area;Right lower leg (including foot);Left lower leg (including foot) Bathing: 5: Supervision: Safety issues/verbal cues   FIM - Upper Body Dressing/Undressing Upper body dressing/undressing steps patient completed: Thread/unthread right sleeve of pullover shirt/dresss;Thread/unthread left sleeve of pullover shirt/dress;Put head through opening of pull over shirt/dress;Pull shirt over trunk;Thread/unthread right bra strap;Thread/unthread left bra strap Upper body dressing/undressing: 5: Supervision: Safety issues/verbal cues FIM - Lower Body Dressing/Undressing Lower body dressing/undressing steps patient completed: Pull underwear up/down;Thread/unthread left underwear leg;Thread/unthread right underwear leg;Thread/unthread right pants leg;Thread/unthread left pants leg;Pull pants up/down;Don/Doff right sock;Don/Doff left sock;Don/Doff left shoe;Don/Doff right shoe Lower body dressing/undressing: 5: Supervision: Safety issues/verbal cues  FIM - Toileting Toileting steps completed by patient: Adjust clothing prior to toileting;Performs perineal hygiene;Adjust clothing after toileting Toileting Assistive Devices: Toilet Aid/prosthesis/orthosis Toileting: 5: Supervision: Safety issues/verbal cues  FIM - Radio producer Devices: Grab bars Toilet Transfers: 5-To toilet/BSC: Supervision (verbal cues/safety issues);5-From toilet/BSC: Supervision (verbal cues/safety issues)  FIM - Control and instrumentation engineer Devices: Copy: 5: Chair or W/C > Bed: Supervision (verbal cues/safety issues);5: Bed > Chair or W/C: Supervision (verbal cues/safety issues);6: Supine > Sit: No assist;6: Sit > Supine: No assist  FIM - Locomotion: Wheelchair Distance: 150 Locomotion: Wheelchair: 5: Travels 150 ft or more: maneuvers on rugs and over door sills with supervision, cueing or coaxing FIM - Locomotion:  Ambulation Locomotion: Ambulation Assistive Devices: Administrator Ambulation/Gait Assistance: 5: Supervision Locomotion: Ambulation: 2: Travels 50 - 149 ft with supervision/safety issues  Comprehension Comprehension Mode: Auditory Comprehension: 7-Follows complex conversation/direction: With no assist  Expression Expression Mode: Verbal Expression: 7-Expresses complex ideas: With no assist  Social Interaction Social Interaction: 7-Interacts appropriately with others - No medications needed.  Problem Solving Problem Solving: 5-Solves complex 90% of the time/cues < 10% of the time  Memory Memory: 7-Complete Independence: No helper  Medical Problem List and Plan:  1. Functional deficits secondary to lumbar stenosis with neurogenic claudication  2. DVT Prophylaxis/Anticoagulation: Pharmaceutical: Lovenox  3. Pain Management: Continue neurontin increase to TID and trial ultram for pain management. Flexeril effective for spasms. Right foot pain likely due  to nerve root lesion,  Neck pain muscular add sportscream 4. Mood: Mood stable and appears brighter today. LCSW to follow for evaluation and support.  5. Neuropsych: This patient is capable of making decisions on her own behalf.  6. Skin/Wound Care: Pressure relief measures--turn 2-3 hours at night to help with pressure and pain relief. . Monitor hydration and nutritional status additionally.  7. Fluids/Electrolytes/Nutrition: Monitor I/O.   8. DM type 2 with diabetic neuropathy: Monitor blood sugars ac/hs.  Off Victoza at this time. Will decrease noon meal coverage to prevent hypoglycemic episodes. Looks good over  Last 72h 9. HTN: Monitor BP every 8 hours. Continue lisinopril, coreg and Norvasc.  10 Non-obstructive CAD: Resume ASA. Continue Coreg, Lipitor and Lisinopril  11. OSA: Continue CPAP with oxygen bled in.  12. Pulmonary HTN/ Asthma: Will encourage IS. Advair bid with duonebs prn SOB/wheezing.  13. ABLA: Improved past  transfusion.      LOS (Days) 10 A FACE TO FACE EVALUATION WAS PERFORMED  KIRSTEINS,ANDREW E 09/07/2014, 7:32 AM

## 2014-09-09 NOTE — Discharge Summary (Signed)
Physician Discharge Summary  Patient ID: Kimberly [name] MRN: [id number] DOB/AGE: [birth year] 57 y.o.  Admit date: 08/23/2014 Discharge date: 09/09/2014  Admission Diagnoses:  Discharge Diagnoses:  Principal Problem:   Lumbosacral spondylosis without myelopathy Active Problems:   Lumbosacral stenosis with neurogenic claudication   Discharged Condition: fair  Hospital Course: Patient was admitted to the hospital where she underwent a L2-3 decompressive laminotomy and foraminotomy for treatment of this very severe lumbar stenosis. Intraoperatively there was a large CSF leak secondary to dural laceration. This was repaired arm primarily. Patient was kept on strict bedrest for 4 days postoperatively. She is gradually mobilize. She had quite a bit of postoperative back and some radicular pain. She was slow to mobilize with decreased sensation in her right lower extremity. The sensation and motor function and eventually improved but she was still quite limited. She was evaluated by the rehabilitation medicine service and felt to be appropriate for inpatient rehabilitation. She was transferred to rehabilitation.  Consults:   Significant Diagnostic Studies:   Treatments:   Discharge Exam: Blood pressure 126/62, pulse 57, temperature 98.2 F (36.8 C), temperature source Oral, resp. rate 16, height 5' (1.524 m), weight 110.678 kg (244 lb), SpO2 98.00%. Awake and alert. Oriented and appropriate. Motor examination intact except right hip flexor for 5. Right quadriceps 4/5. Right anterior tibialis for 5. Right plantar flexors for 5. Sensory examination shows decreased sensation to light touch right L3 distally. Wound clean and dry. Chest and abdomen benign.  Disposition: 01-Home or Self Care     Medication List    STOP taking these medications       doxycycline 100 MG tablet  Commonly known as:  ADOXA     gabapentin 100 MG capsule  Commonly known as:  NEURONTIN     LANTUS SOLOSTAR 100  UNIT/ML injection  Generic drug:  insulin glargine     NOVOLOG FLEXPEN 100 UNIT/ML injection  Generic drug:  insulin aspart      TAKE these medications       ADVAIR DISKUS 500-50 MCG/DOSE Aepb  Generic drug:  Fluticasone-Salmeterol  Inhale 2 puffs into the lungs 2 (two) times daily.     amLODipine 2.5 MG tablet  Commonly known as:  NORVASC  Take 1 tablet (2.5 mg total) by mouth daily.     aspirin EC 81 MG tablet  Take 81 mg by mouth daily.     atorvastatin 10 MG tablet  Commonly known as:  LIPITOR  Take 10 mg by mouth daily.     carvedilol 12.5 MG tablet  Commonly known as:  COREG  Take 12.5 mg by mouth 2 (two) times daily with a meal.     conjugated estrogens vaginal cream  Commonly known as:  PREMARIN  Place 1 Applicatorful vaginally at bedtime.     DUONEB 0.5-2.5 (3) MG/3ML Soln  Generic drug:  ipratropium-albuterol  Take 3 mLs by nebulization daily as needed (for shortness of breath).     famotidine 40 MG tablet  Commonly known as:  PEPCID  Take 40 mg by mouth daily.     furosemide 20 MG tablet  Commonly known as:  LASIX  Take 20-40 mg by mouth daily as needed for fluid.     HYDROcodone-acetaminophen 7.5-325 MG per tablet  Commonly known as:  NORCO  Take 1 tablet by mouth daily as needed for moderate pain.     Levothyroxine Sodium 75 MCG Caps  Take 1 capsule by mouth daily.     lisinopril  10 MG tablet  Commonly known as:  PRINIVIL,ZESTRIL  Take 1 tablet (10 mg total) by mouth daily.     NON FORMULARY  - CPAP: use as directed  -      OXYGEN  Inhale 2.5 L into the lungs at bedtime. OXYGEN 2L: use as directed with cpap     pantoprazole 40 MG tablet  Commonly known as:  PROTONIX  Take 1 tablet (40 mg total) by mouth 2 (two) times daily.     rOPINIRole 1 MG tablet  Commonly known as:  REQUIP  Take 1 mg by mouth at bedtime.     THERA TEARS ALLERGY 0.025 % ophthalmic solution  Generic drug:  ketotifen  Place 2 drops into both eyes 2 (two) times  daily. *Uses drops 2 to 4 times daily*     VENTOLIN HFA 108 (90 BASE) MCG/ACT inhaler  Generic drug:  albuterol  Inhale 2 puffs into the lungs 2 (two) times daily.     VIACTIV [phone] MG-UNT-MCG Chew  Generic drug:  Calcium-Vitamin D-Vitamin K  Chew 1 capsule by mouth daily.     VICTOZA 18 MG/3ML Sopn  Generic drug:  Liraglutide  Inject 1.8 mg into the skin daily.         Signed: Nabor Thomann A 09/09/2014, 4:10 PM

## 2014-09-12 ENCOUNTER — Telehealth: Payer: Self-pay | Admitting: Physical Medicine & Rehabilitation

## 2014-09-12 NOTE — Telephone Encounter (Signed)
Kimberly [name] (PT with [facility]) requesting a verbal for 2w4 for PT, please call her at [phone]

## 2014-09-13 ENCOUNTER — Encounter: Payer: Self-pay | Admitting: *Deleted

## 2014-09-16 NOTE — Telephone Encounter (Signed)
Ok for pt 2x wk for 4 weeks.  Left message

## 2014-09-20 ENCOUNTER — Encounter: Payer: Self-pay | Admitting: Cardiovascular Disease

## 2014-09-20 ENCOUNTER — Ambulatory Visit: Payer: Medicare Other | Admitting: Cardiovascular Disease

## 2014-09-24 DIAGNOSIS — I5032 Chronic diastolic (congestive) heart failure: Secondary | ICD-10-CM

## 2014-09-24 DIAGNOSIS — I2510 Atherosclerotic heart disease of native coronary artery without angina pectoris: Secondary | ICD-10-CM

## 2014-09-24 DIAGNOSIS — Z4789 Encounter for other orthopedic aftercare: Secondary | ICD-10-CM

## 2014-09-24 DIAGNOSIS — E119 Type 2 diabetes mellitus without complications: Secondary | ICD-10-CM

## 2014-10-01 ENCOUNTER — Ambulatory Visit (HOSPITAL_BASED_OUTPATIENT_CLINIC_OR_DEPARTMENT_OTHER): Payer: Medicare Other | Admitting: Physical Medicine & Rehabilitation

## 2014-10-01 ENCOUNTER — Encounter: Payer: Self-pay | Admitting: Physical Medicine & Rehabilitation

## 2014-10-01 ENCOUNTER — Encounter: Payer: Medicare Other | Attending: Physical Medicine & Rehabilitation

## 2014-10-01 VITALS — BP 132/77 | HR 91 | Resp 14 | Wt 242.4 lb

## 2014-10-01 DIAGNOSIS — R531 Weakness: Secondary | ICD-10-CM | POA: Diagnosis not present

## 2014-10-01 DIAGNOSIS — M549 Dorsalgia, unspecified: Secondary | ICD-10-CM | POA: Insufficient documentation

## 2014-10-01 DIAGNOSIS — M4806 Spinal stenosis, lumbar region: Secondary | ICD-10-CM | POA: Insufficient documentation

## 2014-10-01 DIAGNOSIS — M479 Spondylosis, unspecified: Secondary | ICD-10-CM | POA: Insufficient documentation

## 2014-10-01 DIAGNOSIS — M5416 Radiculopathy, lumbar region: Secondary | ICD-10-CM | POA: Insufficient documentation

## 2014-10-01 DIAGNOSIS — G8333 Monoplegia, unspecified affecting right nondominant side: Secondary | ICD-10-CM

## 2014-10-01 DIAGNOSIS — M62830 Muscle spasm of back: Secondary | ICD-10-CM | POA: Diagnosis not present

## 2014-10-01 DIAGNOSIS — R200 Anesthesia of skin: Secondary | ICD-10-CM | POA: Insufficient documentation

## 2014-10-01 DIAGNOSIS — M48062 Spinal stenosis, lumbar region with neurogenic claudication: Secondary | ICD-10-CM

## 2014-10-01 MED ORDER — CYCLOBENZAPRINE HCL 10 MG PO TABS: 10.0000 mg | ORAL_TABLET | Freq: Three times a day (TID) | ORAL | Status: DC | PRN

## 2014-10-01 NOTE — Progress Notes (Signed)
Subjective:    Patient ID: Kimberly [name], female    DOB: 1956/05/23, 58 y.o.   MRN: [id number] Kimberly [name] is a 58 y.o. 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 Patient elected to undergo L2-3 decompressive lam with repair of complex dural laceration on 08/23/14 by Dr. Annette Stable. Post op on bedrest till 10/05 and patient with reports of RLE numbness and heaviness  CIR admit Admit date: 08/28/2014 Discharge date: 09/07/2014  HPI   Has been at home with home health therapies. Making progress. Modified independent with dressing and bathing using assistive devices. Has had 2 falls in the kitchen. Right leg buckled when her weight was shifted toward the right side.  Still has numbness in the right foot .Has Follow up with neurosurgery, tramadol was increased,Still complaining of muscle spasms in the back, just ran out of cyclobenzaprine Neurosurgery also increase gabapentin for shooting pains down the right leg  Not released to driving , had cataract surgery prior to back surgery, will f/u with optho  Pain Inventory Average Pain 6 Pain Right Now 5 My pain is constant, sharp, tingling and aching  In the last 24 hours, has pain interfered with the following? General activity 0 Relation with others 0 Enjoyment of life 0 What TIME of day is your pain at its worst? night Sleep (in general) Fair  Pain is worse with: unsure Pain improves with: rest and medication Relief from Meds: 6  Mobility use a walker do you drive?  no  Function disabled: date disabled 10/2010  Neuro/Psych weakness numbness tingling trouble walking  Prior Studies Any changes since last visit?  no  Physicians involved in your care Any changes since last visit?  no Neurosurgeon Deri Fuelling   Family History  Problem Relation Age of Onset  . Cancer Other   . Heart failure Other   .  Colon cancer Brother    History   Social History  . Marital Status: Married    Spouse Name: N/A    Number of Children: N/A  . Years of Education: N/A   Social History Main Topics  . Smoking status: Never Smoker   . Smokeless tobacco: Never Used  . Alcohol Use: No  . Drug Use: No  . Sexual Activity: None   Other Topics Concern  . None   Social History Narrative   Regularly exercises. Full Time.    Past Surgical History  Procedure Laterality Date  . Eye surgery  1967  . Tubal ligation  1975  . Partial hysterectomy  1981  . Hemorrhoid surgery    . Hernia repair    . Spinahatomy  1992  . Cholecystectomy  1994  . Ovaries removed  2003  . Heel (other)  2005  . Abdominal hysterectomy    . Bilateral knee arthroscopy  2000, 2009  . Colonoscopy N/A 08/01/2013    Procedure: COLONOSCOPY;  Surgeon: Rogene Houston, MD;  Location: [facility];  Service: Endoscopy;  Laterality: N/A;  930  . Cataract extraction w/ intraocular lens  implant, bilateral    . Back surgery    . Appendectomy    . Cyst excision      Left breast  . Cardiac catheterization      2009  . Lumbar laminectomy/decompression microdiscectomy Right 08/23/2014    Procedure: LUMBAR LAMINECTOMY/DECOMPRESSION MICRODISCECTOMY 1 LEVEL;  Surgeon: Charlie Pitter, MD;  Location: [facility];  Service: Neurosurgery;  Laterality: Right;  LUMBAR LAMINECTOMY/DECOMPRESSION MICRODISCECTOMY 1 LEVEL LUMBAR 2-3   Past Medical History  Diagnosis Date  . CAD (coronary artery disease)     nonobstructive by cath, 6/08 (false positive Cardiolite) normal stress echo, 7/11  . Heart failure, diastolic, chronic   . GERD (gastroesophageal reflux disease)   . Morbid obesity   . Obstructive sleep apnea   . IDDM (insulin dependent diabetes mellitus)   . HTN (hypertension)   . Shortness of breath   . Asthma   . Spinal stenosis   . Chronic back pain   . Arthritis   . Degenerative joint disease   . Glaucoma   . CHF (congestive heart  failure)   . Complication of anesthesia     had an asthma attack when woke up from procedure  . Stroke     12/09/2013  . Pneumonia     as a child  . Hypothyroidism   . Depression   . Hernia of abdominal cavity     "upper and lower hernia"  . Seizures     PMH: only as a child  . Heart murmur     PMH:As a child only  . Headache(784.0)   . Neuropathy     associated with diabetes  . Anemia     PMH: as a child  . Fatty liver   . Pancreatitis   . IBS (irritable bowel syndrome)   . Rheumatic fever     PMH: as a child   BP 132/77 mmHg  Pulse 91  Resp 14  Wt 242 lb 6.4 oz (109.952 kg)  SpO2 94%  Opioid Risk Score:   Fall Risk Score: High Fall Risk (>13 points) (educated and given handout on fall prevention in the home) Review of Systems  Respiratory: Positive for apnea, shortness of breath and wheezing.   Cardiovascular: Positive for leg swelling.  Gastrointestinal: Positive for diarrhea.  Endocrine:       Blood sugar fluctuation  Musculoskeletal: Positive for gait problem.  Neurological: Positive for weakness and numbness.       Tingling  All other systems reviewed and are negative.      Objective:   Physical Exam  Motor strength to minus right hip flexor, 3 minus left hip flexor 3 at the right knee extensor 3 minus right ankle dorsiflexor plantar flexor 4 at the left knee extensor plantar flexor dorsiflexor. Ambulates with a rolling walker mild toe drag. Sensation reduced to light touch below the knee as well as reduced proprioception below the knee. Deep tendon reflexes are absent in the right lower extremity and 1+ in the left lower extremity. Sensation intact to light touch in the left knee and ankle area.      Assessment & Plan:  1. Lumbar spinal stenosis with multilevel radiculopathy. Making good functional improvement but still has significant weakness in the right lower extremity which has led to falls. Recommend KAFO, have written prescription for this,  discussed with patient agrees with plan.  2. Poor sleep secondary to muscle spasms at night will increase cyclobenzaprine to 20 g daily at bedtime  Return to clinic in 4-6 weeks we'll check the brace Neurosurgery follow-up, they are prescribing tramadol.

## 2014-10-01 NOTE — Patient Instructions (Signed)
May benefit from outpatient therapy once home health is completed  You may benefit from Knee Ankle Foot Orthosis

## 2014-10-04 ENCOUNTER — Telehealth: Payer: Self-pay | Admitting: *Deleted

## 2014-10-04 NOTE — Telephone Encounter (Signed)
[facility] called for extension on order for PT, 3 more weeks/ 3 times a week followed by 2 more weeks/ 2 times a week. I called PT and gave verbal orders per office protocol

## 2014-10-10 ENCOUNTER — Emergency Department (HOSPITAL_COMMUNITY)
Admission: EM | Admit: 2014-10-10 | Discharge: 2014-10-10 | Disposition: A | Discharge status: Home / Self Care | Payer: Medicare Other | Attending: Emergency Medicine | Admitting: Emergency Medicine

## 2014-10-10 ENCOUNTER — Encounter (HOSPITAL_COMMUNITY): Payer: Self-pay | Admitting: *Deleted

## 2014-10-10 ENCOUNTER — Emergency Department (HOSPITAL_COMMUNITY): Payer: Medicare Other

## 2014-10-10 DIAGNOSIS — Z7951 Long term (current) use of inhaled steroids: Secondary | ICD-10-CM | POA: Insufficient documentation

## 2014-10-10 DIAGNOSIS — Z7982 Long term (current) use of aspirin: Secondary | ICD-10-CM | POA: Diagnosis not present

## 2014-10-10 DIAGNOSIS — I5032 Chronic diastolic (congestive) heart failure: Secondary | ICD-10-CM | POA: Insufficient documentation

## 2014-10-10 DIAGNOSIS — T814XXA Infection following a procedure, initial encounter: Secondary | ICD-10-CM | POA: Diagnosis present

## 2014-10-10 DIAGNOSIS — I10 Essential (primary) hypertension: Secondary | ICD-10-CM | POA: Diagnosis not present

## 2014-10-10 DIAGNOSIS — E1140 Type 2 diabetes mellitus with diabetic neuropathy, unspecified: Secondary | ICD-10-CM | POA: Diagnosis not present

## 2014-10-10 DIAGNOSIS — Y838 Other surgical procedures as the cause of abnormal reaction of the patient, or of later complication, without mention of misadventure at the time of the procedure: Secondary | ICD-10-CM | POA: Insufficient documentation

## 2014-10-10 DIAGNOSIS — Z8701 Personal history of pneumonia (recurrent): Secondary | ICD-10-CM | POA: Insufficient documentation

## 2014-10-10 DIAGNOSIS — E039 Hypothyroidism, unspecified: Secondary | ICD-10-CM | POA: Diagnosis not present

## 2014-10-10 DIAGNOSIS — M1990 Unspecified osteoarthritis, unspecified site: Secondary | ICD-10-CM | POA: Diagnosis not present

## 2014-10-10 DIAGNOSIS — K219 Gastro-esophageal reflux disease without esophagitis: Secondary | ICD-10-CM | POA: Diagnosis not present

## 2014-10-10 DIAGNOSIS — E119 Type 2 diabetes mellitus without complications: Secondary | ICD-10-CM | POA: Insufficient documentation

## 2014-10-10 DIAGNOSIS — Z862 Personal history of diseases of the blood and blood-forming organs and certain disorders involving the immune mechanism: Secondary | ICD-10-CM | POA: Insufficient documentation

## 2014-10-10 DIAGNOSIS — Z8673 Personal history of transient ischemic attack (TIA), and cerebral infarction without residual deficits: Secondary | ICD-10-CM | POA: Diagnosis not present

## 2014-10-10 DIAGNOSIS — L03312 Cellulitis of back [any part except buttock]: Secondary | ICD-10-CM | POA: Insufficient documentation

## 2014-10-10 DIAGNOSIS — Z9889 Other specified postprocedural states: Secondary | ICD-10-CM | POA: Insufficient documentation

## 2014-10-10 DIAGNOSIS — G8929 Other chronic pain: Secondary | ICD-10-CM | POA: Insufficient documentation

## 2014-10-10 DIAGNOSIS — J45909 Unspecified asthma, uncomplicated: Secondary | ICD-10-CM | POA: Insufficient documentation

## 2014-10-10 DIAGNOSIS — I2510 Atherosclerotic heart disease of native coronary artery without angina pectoris: Secondary | ICD-10-CM | POA: Diagnosis not present

## 2014-10-10 DIAGNOSIS — R011 Cardiac murmur, unspecified: Secondary | ICD-10-CM | POA: Diagnosis not present

## 2014-10-10 DIAGNOSIS — Z79899 Other long term (current) drug therapy: Secondary | ICD-10-CM | POA: Insufficient documentation

## 2014-10-10 DIAGNOSIS — T8131XA Disruption of external operation (surgical) wound, not elsewhere classified, initial encounter: Secondary | ICD-10-CM | POA: Insufficient documentation

## 2014-10-10 DIAGNOSIS — Z794 Long term (current) use of insulin: Secondary | ICD-10-CM | POA: Insufficient documentation

## 2014-10-10 DIAGNOSIS — L24A9 Irritant contact dermatitis due friction or contact with other specified body fluids: Secondary | ICD-10-CM

## 2014-10-10 DIAGNOSIS — Z91040 Latex allergy status: Secondary | ICD-10-CM | POA: Diagnosis not present

## 2014-10-10 DIAGNOSIS — G40909 Epilepsy, unspecified, not intractable, without status epilepticus: Secondary | ICD-10-CM | POA: Diagnosis not present

## 2014-10-10 DIAGNOSIS — T148XXA Other injury of unspecified body region, initial encounter: Secondary | ICD-10-CM

## 2014-10-10 LAB — COMPREHENSIVE METABOLIC PANEL
ALT: 18 U/L (ref 0–35)
AST: 33 U/L (ref 0–37)
Albumin: 3.5 g/dL (ref 3.5–5.2)
Alkaline Phosphatase: 108 U/L (ref 39–117)
Anion gap: 12 (ref 5–15)
BILIRUBIN TOTAL: 1.1 mg/dL (ref 0.3–1.2)
BUN: 5 mg/dL — AB (ref 6–23)
CO2: 25 mEq/L (ref 19–32)
CREATININE: 0.66 mg/dL (ref 0.50–1.10)
Calcium: 9.2 mg/dL (ref 8.4–10.5)
Chloride: 105 mEq/L (ref 96–112)
GFR calc Af Amer: >90 mL/min (ref >90)
Glucose, Bld: 126 mg/dL — ABNORMAL HIGH (ref 70–99)
Potassium: 3.8 mEq/L (ref 3.7–5.3)
Sodium: 142 mEq/L (ref 137–147)
Total Protein: 7.5 g/dL (ref 6.0–8.3)

## 2014-10-10 LAB — I-STAT CHEM 8, ED
BUN: <3 mg/dL — ABNORMAL LOW (ref 6–23)
Calcium, Ion: 1.14 mmol/L (ref 1.12–1.23)
Chloride: 106 mEq/L (ref 96–112)
Creatinine, Ser: 0.6 mg/dL (ref 0.50–1.10)
Glucose, Bld: 128 mg/dL — ABNORMAL HIGH (ref 70–99)
HCT: 36 % (ref 36.0–46.0)
HEMOGLOBIN: 12.2 g/dL (ref 12.0–15.0)
Potassium: 3.6 mEq/L — ABNORMAL LOW (ref 3.7–5.3)
Sodium: 143 mEq/L (ref 137–147)
TCO2: 24 mmol/L (ref 0–100)

## 2014-10-10 LAB — CBC WITH DIFFERENTIAL/PLATELET
Basophils Absolute: 0.1 10*3/uL (ref 0.0–0.1)
Basophils Relative: 1 % (ref 0–1)
Eosinophils Absolute: 0.2 10*3/uL (ref 0.0–0.7)
Eosinophils Relative: 4 % (ref 0–5)
HEMATOCRIT: 34.6 % — AB (ref 36.0–46.0)
HEMOGLOBIN: 11.6 g/dL — AB (ref 12.0–15.0)
LYMPHS PCT: 43 % (ref 12–46)
Lymphs Abs: 2.5 10*3/uL (ref 0.7–4.0)
MCH: 29.7 pg (ref 26.0–34.0)
MCHC: 33.5 g/dL (ref 30.0–36.0)
MCV: 88.7 fL (ref 78.0–100.0)
MONO ABS: 0.5 10*3/uL (ref 0.1–1.0)
MONOS PCT: 8 % (ref 3–12)
Neutro Abs: 2.6 10*3/uL (ref 1.7–7.7)
Neutrophils Relative %: 44 % (ref 43–77)
Platelets: 184 10*3/uL (ref 150–400)
RBC: 3.9 MIL/uL (ref 3.87–5.11)
RDW: 14.3 % (ref 11.5–15.5)
WBC: 5.8 10*3/uL (ref 4.0–10.5)

## 2014-10-10 LAB — I-STAT CG4 LACTIC ACID, ED: LACTIC ACID, VENOUS: 0.84 mmol/L (ref 0.5–2.2)

## 2014-10-10 MED ORDER — IOHEXOL 300 MG/ML  SOLN
80.0000 mL | Freq: Once | INTRAMUSCULAR | Status: AC | PRN
  Administered 2014-10-10: 80 mL via INTRAVENOUS

## 2014-10-10 MED ORDER — CEPHALEXIN 500 MG PO CAPS: 500.0000 mg | ORAL_CAPSULE | Freq: Four times a day (QID) | ORAL | Status: AC

## 2014-10-10 MED ORDER — TRAMADOL HCL 50 MG PO TABS
50.0000 mg | ORAL_TABLET | Freq: Once | ORAL | Status: AC
  Administered 2014-10-10: 50 mg via ORAL
  Filled 2014-10-10: qty 1

## 2014-10-10 NOTE — ED Notes (Signed)
Erskine Emeryhris Davis MD at bedside.

## 2014-10-10 NOTE — Discharge Instructions (Signed)
Cellulitis °Cellulitis is an infection of the skin and the tissue beneath it. The infected area is usually red and tender. Cellulitis occurs most often in the arms and lower legs.  °CAUSES  °Cellulitis is caused by bacteria that enter the skin through cracks or cuts in the skin. The most common types of bacteria that cause cellulitis are staphylococci and streptococci. °SIGNS AND SYMPTOMS  °· Redness and warmth. °· Swelling. °· Tenderness or pain. °· Fever. °DIAGNOSIS  °Your health care provider can usually determine what is wrong based on a physical exam. Blood tests may also be done. °TREATMENT  °Treatment usually involves taking an antibiotic medicine. °HOME CARE INSTRUCTIONS  °· Take your antibiotic medicine as directed by your health care provider. Finish the antibiotic even if you start to feel better. °· Keep the infected arm or leg elevated to reduce swelling. °· Apply a warm cloth to the affected area up to 4 times per day to relieve pain. °· Take medicines only as directed by your health care provider. °· Keep all follow-up visits as directed by your health care provider. °SEEK MEDICAL CARE IF:  °· You notice red streaks coming from the infected area. °· Your red area gets larger or turns dark in color. °· Your bone or joint underneath the infected area becomes painful after the skin has healed. °· Your infection returns in the same area or another area. °· You notice a swollen bump in the infected area. °· You develop new symptoms. °· You have a fever. °SEEK IMMEDIATE MEDICAL CARE IF:  °· You feel very sleepy. °· You develop vomiting or diarrhea. °· You have a general ill feeling (malaise) with muscle aches and pains. °MAKE SURE YOU:  °· Understand these instructions. °· Will watch your condition. °· Will get help right away if you are not doing well or get worse. °Document Released: 08/18/2005 Document Revised: 03/25/2014 Document Reviewed: 01/24/2012 °[employer]® Patient Information ©2015 [employer].  This information is not intended to replace advice given to you by your health care provider. Make sure you discuss any questions you have with your health care provider. °Dressing Change °A dressing is a material placed over wounds. It keeps the wound clean, dry, and protected from further injury. This provides an environment that favors wound healing.  °BEFORE YOU BEGIN °· Get your supplies together. Things you may need include: °¨ Saline solution. °¨ Flexible gauze dressing. °¨ Medicated cream. °¨ Tape. °¨ Gloves. °¨ Abdominal dressing pads. °¨ Gauze squares. °¨ Plastic bags. °· Take pain medicine 30 minutes before the dressing change if you need it. °· Take a shower before you do the first dressing change of the day. Use plastic wrap or a plastic bag to prevent the dressing from getting wet. °REMOVING YOUR OLD DRESSING  °· Wash your hands with soap and water. Dry your hands with a clean towel. °· Put on your gloves. °· Remove any tape. °· Carefully remove the old dressing. If the dressing sticks, you may dampen it with warm water to loosen it, or follow your caregiver's specific directions. °· Remove any gauze or packing tape that is in your wound. °· Take off your gloves. °· Put the gloves, tape, gauze, or any packing tape into a plastic bag. °CHANGING YOUR DRESSING °· Open the supplies. °· Take the cap off the saline solution. °· Open the gauze package so that the gauze remains on the inside of the package. °· Put on your gloves. °· Clean your wound   as told by your caregiver. °· If you have been told to keep your wound dry, follow those instructions. °· Your caregiver may tell you to do one or more of the following: °¨ Pick up the gauze. Pour the saline solution over the gauze. Squeeze out the extra saline solution. °¨ Put medicated cream or other medicine on your wound if you have been told to do so. °¨ Put the solution soaked gauze only in your wound, not on the skin around it. °¨ Pack your wound loosely or  as told by your caregiver. °¨ Put dry gauze on your wound. °¨ Put abdominal dressing pads over the dry gauze if your wet gauze soaks through. °· Tape the abdominal dressing pads in place so they will not fall off. Do not wrap the tape completely around the affected part (arm, leg, abdomen). °· Wrap the dressing pads with a flexible gauze dressing to secure it in place. °· Take off your gloves. Put them in the plastic bag with the old dressing. Tie the bag shut and throw it away. °· Keep the dressing clean and dry until your next dressing change. °· Wash your hands. °SEEK MEDICAL CARE IF: °· Your skin around the wound looks red. °· Your wound feels more tender or sore. °· You see pus in the wound. °· Your wound smells bad. °· You have a fever. °· Your skin around the wound has a rash that itches and burns. °· You see black or yellow skin in your wound that was not there before. °· You feel nauseous, throw up, and feel very tired. °Document Released: 12/16/2004 Document Revised: 01/31/2012 Document Reviewed: 09/20/2011 °ExitCare® Patient Information ©2015 [employer]. This information is not intended to replace advice given to you by your health care provider. Make sure you discuss any questions you have with your health care provider. ° °

## 2014-10-10 NOTE — ED Notes (Signed)
CT called

## 2014-10-10 NOTE — ED Provider Notes (Signed)
MSE was initiated and I personally evaluated the patient and placed orders (if any) at  3:08 PM on October 10, 2014.  The patient appears stable so that the remainder of the MSE may be completed by another provider.  Kimberly [name] is a 58 y.o. female complaining of increased drainage to lumbar surgical incision site. Surgery was performed by Dr. Dutch QuintPoole on October 2 she's had a complicated course since that time. Patient endorses chills. In order to speed patient's care blood work and lumbar CT with contrast is ordered.  Kimberly Emeryicole Jaquayla Hege, PA-C 10/10/14 1508  Kimberly FossaElizabeth Rees, MD 10/10/14 [phone]

## 2014-10-10 NOTE — ED Notes (Signed)
Resident at the bedside

## 2014-10-10 NOTE — ED Provider Notes (Signed)
CSN: [id number]     Arrival date & time 10/10/14  1421 History   First MD Initiated Contact with Patient 10/10/14 1841     Chief Complaint  Patient presents with  . post-op infection      (Consider location/radiation/quality/duration/timing/severity/associated sxs/prior Treatment) HPI The patient is a 58 year old female with recent spinal surgery who presents complaining of drainage from her surgical incision. She says that for the past week she has had an increased amount of serous drainage from her surgical incision on her lower back. She says that about a week ago she noticed that this the incision site had opened up.  She says it's been draining some serous fluid. She has been packing it with gauze for the last few days. She tried to make an appointment to go see her neurosurgeon today to have the wound evaluated, but she was unable to get there due to car trouble. She was advised to come to the emergency department for a wound check.  She denies any fevers, she has had no abdominal pain. She denies any new neurologic symptoms, describes persistent, but improving weakness in her right leg.   Past Medical History  Diagnosis Date  . CAD (coronary artery disease)     nonobstructive by cath, 6/08 (false positive Cardiolite) normal stress echo, 7/11  . Heart failure, diastolic, chronic   . GERD (gastroesophageal reflux disease)   . Morbid obesity   . Obstructive sleep apnea   . IDDM (insulin dependent diabetes mellitus)   . HTN (hypertension)   . Shortness of breath   . Asthma   . Spinal stenosis   . Chronic back pain   . Arthritis   . Degenerative joint disease   . Glaucoma   . CHF (congestive heart failure)   . Complication of anesthesia     had an asthma attack when woke up from procedure  . Stroke     12/09/2013  . Pneumonia     as a child  . Hypothyroidism   . Depression   . Hernia of abdominal cavity     "upper and lower hernia"  . Seizures     PMH: only as a child  .  Heart murmur     PMH:As a child only  . Headache(784.0)   . Neuropathy     associated with diabetes  . Anemia     PMH: as a child  . Fatty liver   . Pancreatitis   . IBS (irritable bowel syndrome)   . Rheumatic fever     PMH: as a child   Past Surgical History  Procedure Laterality Date  . Eye surgery  1967  . Tubal ligation  1975  . Partial hysterectomy  1981  . Hemorrhoid surgery    . Hernia repair    . Spinahatomy  1992  . Cholecystectomy  1994  . Ovaries removed  2003  . Heel (other)  2005  . Abdominal hysterectomy    . Bilateral knee arthroscopy  2000, 2009  . Colonoscopy N/A 08/01/2013    Procedure: COLONOSCOPY;  Surgeon: Malissa Hippo, MD;  Location: [facility];  Service: Endoscopy;  Laterality: N/A;  930  . Cataract extraction w/ intraocular lens  implant, bilateral    . Back surgery    . Appendectomy    . Cyst excision      Left breast  . Cardiac catheterization      2009  . Lumbar laminectomy/decompression microdiscectomy Right 08/23/2014    Procedure:  LUMBAR LAMINECTOMY/DECOMPRESSION MICRODISCECTOMY 1 LEVEL;  Surgeon: Temple PaciniHenry A Pool, MD;  Location: [facility];  Service: Neurosurgery;  Laterality: Right;  LUMBAR LAMINECTOMY/DECOMPRESSION MICRODISCECTOMY 1 LEVEL LUMBAR 2-3   Family History  Problem Relation Age of Onset  . Cancer Other   . Heart failure Other   . Colon cancer Brother    History  Substance Use Topics  . Smoking status: Never Smoker   . Smokeless tobacco: Never Used  . Alcohol Use: No   OB History    No data available     Review of Systems  Constitutional: Negative for fever, chills and fatigue.  Musculoskeletal: Positive for back pain.  Skin: Positive for wound.  Neurological: Positive for weakness ( Right leg, chronic).  All other systems reviewed and are negative.     Allergies  Hydrocodone; Naproxen; Other; Oxycodone; Sulfonamide derivatives; Latex; and Tape  Home Medications   Prior to Admission medications    Medication Sig Start Date End Date Taking? Authorizing Provider  albuterol (VENTOLIN HFA) 108 (90 BASE) MCG/ACT inhaler Inhale 2 puffs into the lungs as needed for wheezing or shortness of breath.    Yes Historical Provider, MD  amLODipine (NORVASC) 2.5 MG tablet Take 1 tablet (2.5 mg total) by mouth daily. Patient taking differently: Take 2.5 mg by mouth every evening.  07/05/14  Yes Pricilla RifflePaula Ross V, MD  aspirin EC 81 MG tablet Take 81 mg by mouth daily.   Yes Historical Provider, MD  atorvastatin (LIPITOR) 10 MG tablet Take 10 mg by mouth daily.   Yes Historical Provider, MD  carvedilol (COREG) 12.5 MG tablet Take 12.5 mg by mouth 2 (two) times daily with a meal.   Yes Historical Provider, MD  conjugated estrogens (PREMARIN) vaginal cream Place 1 Applicatorful vaginally at bedtime.   Yes Historical Provider, MD  cyclobenzaprine (FLEXERIL) 10 MG tablet Take 1 tablet (10 mg total) by mouth 3 (three) times daily as needed for muscle spasms. 10/01/14  Yes Erick ColaceAndrew E Kirsteins, MD  famotidine (PEPCID) 40 MG tablet Take 40 mg by mouth daily.   Yes Historical Provider, MD  Fluticasone-Salmeterol (ADVAIR DISKUS) 500-50 MCG/DOSE AEPB Inhale 2 puffs into the lungs 2 (two) times daily.    Yes Historical Provider, MD  furosemide (LASIX) 20 MG tablet Take 20-40 mg by mouth daily as needed for fluid.   Yes Historical Provider, MD  gabapentin (NEURONTIN) 300 MG capsule Take 1 capsule (300 mg total) by mouth 3 (three) times daily. Patient taking differently: Take 300-600 mg by mouth 3 (three) times daily.  09/06/14  Yes Evlyn KannerPamela S Love, PA-C  insulin aspart (NOVOLOG) 100 UNIT/ML injection 10 units at lunch, 25 units at supper. Then sliding scale as needed. Patient taking differently: Inject 10-25 Units into the skin See admin instructions. 10 units at lunch, 25 units at supper. Then sliding scale as needed. 09/06/14  Yes Evlyn KannerPamela S Love, PA-C  insulin glargine (LANTUS) 100 UNIT/ML injection 30 @ bedtime, 25 in the  morning Patient taking differently: Inject 25-30 Units into the skin 2 (two) times daily. 30 @ bedtime, 25 in the morning 09/06/14  Yes Evlyn KannerPamela S Love, PA-C  ipratropium-albuterol (DUONEB) 0.5-2.5 (3) MG/3ML SOLN Take 3 mLs by nebulization daily as needed (for shortness of breath).    Yes Historical Provider, MD  ketotifen (THERA TEARS ALLERGY) 0.025 % ophthalmic solution Place 2 drops into both eyes 2 (two) times daily. *Uses drops 2 to 4 times daily*   Yes Historical Provider, MD  Levothyroxine Sodium 75  MCG CAPS Take 1 capsule by mouth daily.    Yes Historical Provider, MD  lidocaine (XYLOCAINE) 2 % jelly Place 1 application into the urethra as needed (for pain).  08/12/14  Yes Historical Provider, MD  Liraglutide (VICTOZA) 18 MG/3ML SOPN Inject 1.8 mg into the skin daily.   Yes Historical Provider, MD  lisinopril (PRINIVIL,ZESTRIL) 10 MG tablet Take 1 tablet (10 mg total) by mouth daily. 07/05/14  Yes Pricilla RifflePaula Ross V, MD  Menthol-Methyl Salicylate (MUSCLE RUB) 10-15 % CREA Apply 1 application topically as needed for muscle pain. 09/06/14  Yes Pamela S Love, PA-C  NON FORMULARY CPAP: use as directed     Yes Historical Provider, MD  OXYGEN-HELIUM IN Inhale 2.5 L into the lungs at bedtime. OXYGEN 2L: use as directed with cpap   Yes Historical Provider, MD  pantoprazole (PROTONIX) 40 MG tablet Take 1 tablet (40 mg total) by mouth 2 (two) times daily. 06/23/14  Yes Marinda ElkAbraham Feliz Ortiz, MD  rOPINIRole (REQUIP) 1 MG tablet Take 1 mg by mouth at bedtime.   Yes Historical Provider, MD  senna (SENOKOT) 8.6 MG TABS tablet Take 1 tablet (8.6 mg total) by mouth 2 (two) times daily. For constipation 09/06/14  Yes Evlyn KannerPamela S Love, PA-C  traMADol (ULTRAM) 50 MG tablet Take 1 tablet (50 mg total) by mouth every 6 (six) hours as needed for moderate pain or severe pain. 09/06/14  Yes Evlyn KannerPamela S Love, PA-C  acetaminophen (TYLENOL) 325 MG tablet Take 1-2 tablets (325-650 mg total) by mouth every 4 (four) hours as needed for mild  pain. 09/06/14   Jacquelynn CreePamela S Love, PA-C  cephALEXin (KEFLEX) 500 MG capsule Take 1 capsule (500 mg total) by mouth 4 (four) times daily. 10/10/14 10/17/14  Erskine Emeryhris Arnoldo Hildreth, MD   BP 128/86 mmHg  Pulse 80  Temp(Src) 97.7 F (36.5 C) (Oral)  Resp 16  SpO2 97% Physical Exam  Constitutional: She is oriented to person, place, and time. She appears well-developed and well-nourished.  HENT:  Head: Normocephalic and atraumatic.  Eyes: Pupils are equal, round, and reactive to light.  Neck: Normal range of motion.  Cardiovascular: Normal rate, regular rhythm, normal heart sounds and intact distal pulses.   Pulmonary/Chest: Effort normal and breath sounds normal. No respiratory distress. She has no wheezes.  Abdominal: Soft. Bowel sounds are normal. She exhibits no distension. There is no tenderness.  Musculoskeletal: Normal range of motion. She exhibits edema (1+ edema of the bilateral lower extremities).  Neurological: She is alert and oriented to person, place, and time.  Strength diminished in right lower extremity, mildly diminished sensation in the L3 distribution, this is present on exam is documented postoperatively.    Skin: Skin is warm.  Approximately 4 cm vertical midline lumbar incision with erythematous edges with minimal induration. Open with granulation tissue present, probed and has no deep extension. No appreciable drainage  Nursing note and vitals reviewed.   ED Course  Procedures (including critical care time) Labs Review Labs Reviewed  CBC WITH DIFFERENTIAL - Abnormal; Notable for the following:    Hemoglobin 11.6 (*)    HCT 34.6 (*)    All other components within normal limits  COMPREHENSIVE METABOLIC PANEL - Abnormal; Notable for the following:    Glucose, Bld 126 (*)    BUN 5 (*)    All other components within normal limits  I-STAT CHEM 8, ED - Abnormal; Notable for the following:    Potassium 3.6 (*)    BUN <3 (*)  Glucose, Bld 128 (*)    All other components  within normal limits  I-STAT CG4 LACTIC ACID, ED    Imaging Review Ct Lumbar Spine W Contrast  10/10/2014   CLINICAL DATA:  Increased drainage from surgical incision for lumbar laminectomy on 08/28/2014.  EXAM: CT LUMBAR SPINE WITH CONTRAST  TECHNIQUE: Multidetector CT imaging of the lumbar spine was performed with intravenous contrast administration. Multiplanar CT image reconstructions were also generated.  CONTRAST:  80mL OMNIPAQUE IOHEXOL 300 MG/ML  SOLN  COMPARISON:  MRI dated 07/04/2014 and CT scan of the abdomen dated 02/09/2010  FINDINGS: The patient has undergone posterior decompression with resection of the lamina of L3 and partial facetectomies of L3 with excellent posterior decompression. There is a small amount of fluid along the incision but there is no definable abscess. This fluid does extend to the spinal canal. There is no evidence of an epidural fluid collection. This is a typical postoperative appearance.  The scan extends from T12-L1 through S1-2. There are prominent bridging osteophytes and calcification of posterior longitudinal ligament at T12-L1 through L3-4 with slight compression of the thecal sac at each level. There is moderately severe bilateral facet arthritis at L4-5. 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  Incidental note is made of numerous varicosities in the left side of the abdomen with what appears to be a spontaneous splenorenal shunt as described on prior CT scan dated 02/09/2010.  IMPRESSION: Small amount of fluid along the incision at the L2-3 level to the expected degree. No abnormal epidural fluid collection. There is no pathologic enhancement around the fluid. This probably represents a small amount of postoperative seroma.   Electronically Signed   By: Geanie Cooley M.D.   On: 10/10/2014 18:42     EKG Interpretation None      MDM   Final diagnoses:  Wound dehiscence, initial encounter  Cellulitis of back except buttock    58 year old female presents due to  concern for wound infection of her operative site in her lumbar spine. Vital signs normal, no tachycardia or fever here. Describes some generalized fatigue, however this is not acute and has been present since surgery. No leukocytosis, normal lactate. CT was ordered in triage which is consistent with seroma, no radiographic evidence of a epidural abscess. The wound was explored and probed and does not extend deep, appears to have minimal induration around the wound edges concerning for possibly an early cellulitis, but no concern for deep extension.  There is no drainage, and she is not been having any headaches, there is no CT evidence of anything that would suggest that she is having a spinal fluid leak.  We will call on-call neurosurgery to discuss case, likely can discharge home with Keflex for cellulitis.  Discussed case with on-call neurosurgeon Dr. Yetta Barre who agrees with plan. I will have her follow back up with neurosurgery in 2 weeks for wound check.  Erskine Emery, MD 10/11/14 0021  Richardean Canal, MD 10/11/14 [phone]

## 2014-10-10 NOTE — ED Notes (Signed)
Resident at bedside.  

## 2014-10-10 NOTE — ED Notes (Signed)
Pt with back surgery on Oct 7.  Has had home healthcare RN to change dressing.  Tuesday she called Dr Dutch QuintPoole and stated she felt pt increased drainage to incision site.  Pt unable to make to apt to see MD today, so MD stated to come to ED.

## 2014-10-14 ENCOUNTER — Ambulatory Visit: Payer: Medicare Other | Admitting: Internal Medicine

## 2014-11-05 ENCOUNTER — Ambulatory Visit: Payer: Medicare Other | Admitting: Physical Medicine & Rehabilitation

## 2014-11-10 DIAGNOSIS — E119 Type 2 diabetes mellitus without complications: Secondary | ICD-10-CM

## 2014-11-10 DIAGNOSIS — Z4789 Encounter for other orthopedic aftercare: Secondary | ICD-10-CM

## 2014-11-10 DIAGNOSIS — I5032 Chronic diastolic (congestive) heart failure: Secondary | ICD-10-CM

## 2014-11-10 DIAGNOSIS — I2510 Atherosclerotic heart disease of native coronary artery without angina pectoris: Secondary | ICD-10-CM

## 2014-11-18 ENCOUNTER — Telehealth: Payer: Self-pay | Admitting: *Deleted

## 2014-11-18 NOTE — Telephone Encounter (Signed)
[facility] called asking about a fax sent over to Dr. Wynn BankerKirsteins on Dec. 22nd, I called [facility] back and informed it will be taken care of as soon as Dr. Wynn BankerKirsteins returns from holiday

## 2014-11-19 ENCOUNTER — Encounter: Payer: Self-pay | Admitting: *Deleted

## 2014-11-29 ENCOUNTER — Encounter: Payer: Medicare Other | Attending: Physical Medicine & Rehabilitation

## 2014-11-29 ENCOUNTER — Ambulatory Visit: Payer: Medicare Other | Admitting: Physical Medicine & Rehabilitation

## 2014-11-29 DIAGNOSIS — M479 Spondylosis, unspecified: Secondary | ICD-10-CM | POA: Insufficient documentation

## 2014-11-29 DIAGNOSIS — M549 Dorsalgia, unspecified: Secondary | ICD-10-CM | POA: Insufficient documentation

## 2014-11-29 DIAGNOSIS — R531 Weakness: Secondary | ICD-10-CM | POA: Insufficient documentation

## 2014-11-29 DIAGNOSIS — R200 Anesthesia of skin: Secondary | ICD-10-CM | POA: Insufficient documentation

## 2014-11-29 DIAGNOSIS — M4806 Spinal stenosis, lumbar region: Secondary | ICD-10-CM | POA: Insufficient documentation

## 2014-11-29 DIAGNOSIS — M5416 Radiculopathy, lumbar region: Secondary | ICD-10-CM | POA: Insufficient documentation

## 2014-11-29 DIAGNOSIS — M62830 Muscle spasm of back: Secondary | ICD-10-CM | POA: Insufficient documentation

## 2014-12-19 ENCOUNTER — Telehealth: Payer: Self-pay | Admitting: *Deleted

## 2014-12-19 NOTE — Telephone Encounter (Signed)
recvd letter from [employer] that the patients Cyclobenzaprine is no longer covered under their insurance.  We would have to switch the patient to Sertraline, Diclofenac, Tizanidine or Lyrica

## 2015-01-03 ENCOUNTER — Other Ambulatory Visit: Payer: Self-pay | Admitting: Physical Medicine & Rehabilitation

## 2015-01-08 ENCOUNTER — Telehealth: Payer: Self-pay | Admitting: *Deleted

## 2015-01-08 MED ORDER — TIZANIDINE HCL 2 MG PO TABS: 2.0000 mg | ORAL_TABLET | Freq: Three times a day (TID) | ORAL | Status: DC | PRN

## 2015-01-08 NOTE — Telephone Encounter (Signed)
May try tizanidine 35m TID prn #45 with 1 RF

## 2015-01-08 NOTE — Telephone Encounter (Signed)
Orders given to switch to tizanidine from flexeril.  Order placed.

## 2015-01-08 NOTE — Telephone Encounter (Signed)
Insurance rejecting order for flexeril and requires prior authorization. Formulary suggests alternative meloxicam(?) and tizanidine. Do you want to try her on tizanidine?  (She was a hospital follow up and cancelled her December and January visit and has no follow up scheduled.)

## 2015-11-11 ENCOUNTER — Ambulatory Visit: Payer: Medicare Other | Admitting: Neurology

## 2016-08-04 ENCOUNTER — Observation Stay (HOSPITAL_COMMUNITY)
Admission: EM | Admit: 2016-08-04 | Discharge: 2016-08-06 | Disposition: A | Discharge status: Home / Self Care | Payer: Medicare Other | Attending: Internal Medicine | Admitting: Internal Medicine

## 2016-08-04 ENCOUNTER — Encounter (HOSPITAL_COMMUNITY): Payer: Self-pay | Admitting: Emergency Medicine

## 2016-08-04 ENCOUNTER — Emergency Department (HOSPITAL_COMMUNITY): Payer: Medicare Other

## 2016-08-04 DIAGNOSIS — E119 Type 2 diabetes mellitus without complications: Secondary | ICD-10-CM | POA: Diagnosis not present

## 2016-08-04 DIAGNOSIS — E039 Hypothyroidism, unspecified: Secondary | ICD-10-CM | POA: Insufficient documentation

## 2016-08-04 DIAGNOSIS — I110 Hypertensive heart disease with heart failure: Secondary | ICD-10-CM | POA: Insufficient documentation

## 2016-08-04 DIAGNOSIS — R079 Chest pain, unspecified: Secondary | ICD-10-CM | POA: Diagnosis not present

## 2016-08-04 DIAGNOSIS — M48062 Spinal stenosis, lumbar region with neurogenic claudication: Secondary | ICD-10-CM | POA: Diagnosis present

## 2016-08-04 DIAGNOSIS — R0602 Shortness of breath: Secondary | ICD-10-CM | POA: Insufficient documentation

## 2016-08-04 DIAGNOSIS — M4806 Spinal stenosis, lumbar region: Secondary | ICD-10-CM

## 2016-08-04 DIAGNOSIS — Z79899 Other long term (current) drug therapy: Secondary | ICD-10-CM | POA: Diagnosis not present

## 2016-08-04 DIAGNOSIS — I10 Essential (primary) hypertension: Secondary | ICD-10-CM

## 2016-08-04 DIAGNOSIS — E109 Type 1 diabetes mellitus without complications: Secondary | ICD-10-CM | POA: Insufficient documentation

## 2016-08-04 DIAGNOSIS — R110 Nausea: Secondary | ICD-10-CM | POA: Diagnosis not present

## 2016-08-04 DIAGNOSIS — Z7982 Long term (current) use of aspirin: Secondary | ICD-10-CM | POA: Diagnosis not present

## 2016-08-04 DIAGNOSIS — R072 Precordial pain: Secondary | ICD-10-CM | POA: Diagnosis present

## 2016-08-04 DIAGNOSIS — I5032 Chronic diastolic (congestive) heart failure: Secondary | ICD-10-CM | POA: Diagnosis not present

## 2016-08-04 DIAGNOSIS — R0789 Other chest pain: Secondary | ICD-10-CM | POA: Diagnosis not present

## 2016-08-04 DIAGNOSIS — R42 Dizziness and giddiness: Secondary | ICD-10-CM | POA: Insufficient documentation

## 2016-08-04 DIAGNOSIS — J45909 Unspecified asthma, uncomplicated: Secondary | ICD-10-CM | POA: Insufficient documentation

## 2016-08-04 DIAGNOSIS — I2510 Atherosclerotic heart disease of native coronary artery without angina pectoris: Secondary | ICD-10-CM | POA: Diagnosis not present

## 2016-08-04 DIAGNOSIS — G4733 Obstructive sleep apnea (adult) (pediatric): Secondary | ICD-10-CM | POA: Diagnosis present

## 2016-08-04 DIAGNOSIS — Z794 Long term (current) use of insulin: Secondary | ICD-10-CM | POA: Diagnosis not present

## 2016-08-04 LAB — CBC
HEMATOCRIT: 36.7 % (ref 36.0–46.0)
HEMOGLOBIN: 12.8 g/dL (ref 12.0–15.0)
MCH: 31.9 pg (ref 26.0–34.0)
MCHC: 34.9 g/dL (ref 30.0–36.0)
MCV: 91.5 fL (ref 78.0–100.0)
Platelets: 134 10*3/uL — ABNORMAL LOW (ref 150–400)
RBC: 4.01 MIL/uL (ref 3.87–5.11)
RDW: 14.6 % (ref 11.5–15.5)
WBC: 5.1 10*3/uL (ref 4.0–10.5)

## 2016-08-04 LAB — URINALYSIS, ROUTINE W REFLEX MICROSCOPIC
BILIRUBIN URINE: NEGATIVE
Glucose, UA: NEGATIVE mg/dL
Hgb urine dipstick: NEGATIVE
KETONES UR: NEGATIVE mg/dL
NITRITE: NEGATIVE
PH: 6 (ref 5.0–8.0)
PROTEIN: NEGATIVE mg/dL
Specific Gravity, Urine: 1.01 (ref 1.005–1.030)

## 2016-08-04 LAB — BASIC METABOLIC PANEL
Anion gap: 9 (ref 5–15)
BUN: 10 mg/dL (ref 6–20)
CO2: 29 mmol/L (ref 22–32)
Calcium: 9.2 mg/dL (ref 8.9–10.3)
Chloride: 99 mmol/L — ABNORMAL LOW (ref 101–111)
Creatinine, Ser: 0.94 mg/dL (ref 0.44–1.00)
GFR calc Af Amer: >60 mL/min (ref >60)
GLUCOSE: 236 mg/dL — AB (ref 65–99)
POTASSIUM: 3.6 mmol/L (ref 3.5–5.1)
Sodium: 137 mmol/L (ref 135–145)

## 2016-08-04 LAB — URINE MICROSCOPIC-ADD ON

## 2016-08-04 LAB — TROPONIN I: Troponin I: <0.03 ng/mL (ref <0.03)

## 2016-08-04 LAB — BRAIN NATRIURETIC PEPTIDE: B NATRIURETIC PEPTIDE 5: 41 pg/mL (ref 0.0–100.0)

## 2016-08-04 LAB — GLUCOSE, CAPILLARY: Glucose-Capillary: 161 mg/dL — ABNORMAL HIGH (ref 65–99)

## 2016-08-04 LAB — PROTIME-INR
INR: 1.03
PROTHROMBIN TIME: 13.5 s (ref 11.4–15.2)

## 2016-08-04 MED ORDER — ATORVASTATIN CALCIUM 10 MG PO TABS
10.0000 mg | ORAL_TABLET | Freq: Every day | ORAL | Status: DC
  Administered 2016-08-05: 10 mg via ORAL
  Filled 2016-08-04: qty 1

## 2016-08-04 MED ORDER — SODIUM CHLORIDE 0.9 % IV SOLN: 250.0000 mL | INTRAVENOUS | Status: DC | PRN

## 2016-08-04 MED ORDER — ROPINIROLE HCL 1 MG PO TABS
1.0000 mg | ORAL_TABLET | Freq: Every day | ORAL | Status: DC
  Administered 2016-08-04 – 2016-08-05 (×2): 1 mg via ORAL
  Filled 2016-08-04 (×2): qty 1

## 2016-08-04 MED ORDER — ONDANSETRON HCL 4 MG PO TABS: 4.0000 mg | ORAL_TABLET | Freq: Four times a day (QID) | ORAL | Status: DC | PRN

## 2016-08-04 MED ORDER — BACLOFEN 10 MG PO TABS
10.0000 mg | ORAL_TABLET | Freq: Three times a day (TID) | ORAL | Status: DC | PRN
  Administered 2016-08-05: 10 mg via ORAL
  Filled 2016-08-04: qty 1

## 2016-08-04 MED ORDER — FAMOTIDINE 20 MG PO TABS
40.0000 mg | ORAL_TABLET | Freq: Every day | ORAL | Status: DC
  Administered 2016-08-05 – 2016-08-06 (×2): 40 mg via ORAL
  Filled 2016-08-04 (×2): qty 2

## 2016-08-04 MED ORDER — HEPARIN (PORCINE) IN NACL 100-0.45 UNIT/ML-% IJ SOLN
950.0000 [IU]/h | INTRAMUSCULAR | Status: DC
  Administered 2016-08-04: 950 [IU]/h via INTRAVENOUS
  Filled 2016-08-04: qty 250

## 2016-08-04 MED ORDER — SODIUM CHLORIDE 0.9% FLUSH
3.0000 mL | Freq: Two times a day (BID) | INTRAVENOUS | Status: DC
  Administered 2016-08-04 – 2016-08-05 (×3): 3 mL via INTRAVENOUS

## 2016-08-04 MED ORDER — ONDANSETRON HCL 4 MG/2ML IJ SOLN: 4.0000 mg | Freq: Four times a day (QID) | INTRAMUSCULAR | Status: DC | PRN

## 2016-08-04 MED ORDER — SENNA 8.6 MG PO TABS
1.0000 | ORAL_TABLET | Freq: Two times a day (BID) | ORAL | Status: DC
  Administered 2016-08-04 – 2016-08-06 (×4): 8.6 mg via ORAL
  Filled 2016-08-04 (×4): qty 1

## 2016-08-04 MED ORDER — FLUTICASONE FUROATE-VILANTEROL 200-25 MCG/INH IN AEPB
1.0000 | INHALATION_SPRAY | Freq: Every day | RESPIRATORY_TRACT | Status: DC
  Administered 2016-08-05 – 2016-08-06 (×2): 1 via RESPIRATORY_TRACT
  Filled 2016-08-04: qty 28

## 2016-08-04 MED ORDER — ACETAMINOPHEN 650 MG RE SUPP: 650.0000 mg | Freq: Four times a day (QID) | RECTAL | Status: DC | PRN

## 2016-08-04 MED ORDER — AMLODIPINE BESYLATE 5 MG PO TABS
2.5000 mg | ORAL_TABLET | Freq: Every evening | ORAL | Status: DC
  Administered 2016-08-05: 2.5 mg via ORAL
  Filled 2016-08-04: qty 1

## 2016-08-04 MED ORDER — INSULIN GLARGINE 100 UNIT/ML ~~LOC~~ SOLN
40.0000 [IU] | Freq: Every day | SUBCUTANEOUS | Status: DC
  Administered 2016-08-04 – 2016-08-05 (×2): 40 [IU] via SUBCUTANEOUS
  Filled 2016-08-04 (×5): qty 0.4

## 2016-08-04 MED ORDER — SODIUM CHLORIDE 0.9% FLUSH: 3.0000 mL | INTRAVENOUS | Status: DC | PRN

## 2016-08-04 MED ORDER — LISINOPRIL 10 MG PO TABS
10.0000 mg | ORAL_TABLET | Freq: Every day | ORAL | Status: DC
  Administered 2016-08-05 – 2016-08-06 (×2): 10 mg via ORAL
  Filled 2016-08-04 (×2): qty 1

## 2016-08-04 MED ORDER — ASPIRIN EC 81 MG PO TBEC
81.0000 mg | DELAYED_RELEASE_TABLET | Freq: Every day | ORAL | Status: DC
  Administered 2016-08-05 – 2016-08-06 (×2): 81 mg via ORAL
  Filled 2016-08-04 (×2): qty 1

## 2016-08-04 MED ORDER — INSULIN ASPART 100 UNIT/ML ~~LOC~~ SOLN
0.0000 [IU] | Freq: Three times a day (TID) | SUBCUTANEOUS | Status: DC
Start: 2016-08-05 — End: 2016-08-06
  Administered 2016-08-05: 3 [IU] via SUBCUTANEOUS
  Administered 2016-08-05 (×2): 2 [IU] via SUBCUTANEOUS
  Administered 2016-08-06: 8 [IU] via SUBCUTANEOUS

## 2016-08-04 MED ORDER — LEVOTHYROXINE SODIUM 75 MCG PO TABS
75.0000 ug | ORAL_TABLET | Freq: Every day | ORAL | Status: DC
  Administered 2016-08-05 – 2016-08-06 (×2): 75 ug via ORAL
  Filled 2016-08-04 (×2): qty 1

## 2016-08-04 MED ORDER — ALBUTEROL SULFATE (2.5 MG/3ML) 0.083% IN NEBU: 2.5000 mg | INHALATION_SOLUTION | RESPIRATORY_TRACT | Status: DC | PRN

## 2016-08-04 MED ORDER — SODIUM CHLORIDE 0.9% FLUSH
3.0000 mL | Freq: Two times a day (BID) | INTRAVENOUS | Status: DC
  Administered 2016-08-04 – 2016-08-05 (×2): 3 mL via INTRAVENOUS

## 2016-08-04 MED ORDER — CARVEDILOL 12.5 MG PO TABS
12.5000 mg | ORAL_TABLET | Freq: Two times a day (BID) | ORAL | Status: DC
  Administered 2016-08-05 – 2016-08-06 (×3): 12.5 mg via ORAL
  Filled 2016-08-04 (×4): qty 1

## 2016-08-04 MED ORDER — TRAMADOL HCL 50 MG PO TABS
50.0000 mg | ORAL_TABLET | Freq: Four times a day (QID) | ORAL | Status: DC | PRN
  Administered 2016-08-04 – 2016-08-06 (×3): 50 mg via ORAL
  Filled 2016-08-04 (×3): qty 1

## 2016-08-04 MED ORDER — GABAPENTIN 300 MG PO CAPS
600.0000 mg | ORAL_CAPSULE | Freq: Two times a day (BID) | ORAL | Status: DC
  Administered 2016-08-04 – 2016-08-06 (×4): 600 mg via ORAL
  Filled 2016-08-04 (×4): qty 2

## 2016-08-04 MED ORDER — HEPARIN BOLUS VIA INFUSION
4000.0000 [IU] | Freq: Once | INTRAVENOUS | Status: AC
  Administered 2016-08-04: 4000 [IU] via INTRAVENOUS
  Filled 2016-08-04: qty 4000

## 2016-08-04 MED ORDER — NITROGLYCERIN 0.4 MG SL SUBL
0.4000 mg | SUBLINGUAL_TABLET | SUBLINGUAL | Status: DC | PRN
Start: 2016-08-04 — End: 2016-08-04

## 2016-08-04 MED ORDER — ACETAMINOPHEN 325 MG PO TABS
650.0000 mg | ORAL_TABLET | Freq: Four times a day (QID) | ORAL | Status: DC | PRN
  Filled 2016-08-04: qty 2

## 2016-08-04 NOTE — ED Triage Notes (Signed)
Pt reports chest pain since last night. Pt reports dizziness upon waking up and reports generalized pain all over. nad noted. Facial symmetry noted. Pt reports "having trouble staying awake lately." speech clear. Airway patent.

## 2016-08-04 NOTE — ED Notes (Signed)
Report given to Jennifer RN on 300 

## 2016-08-04 NOTE — ED Provider Notes (Signed)
[facility] Provider Note   CSN: [id number] Arrival date & time: 08/04/16  1226     History   Chief Complaint Chief Complaint  Patient presents with  . Chest Pain    HPI Kimberly [name] is a 60 y.o. female.  The history is provided by the patient.  Chest Pain   This is a recurrent problem. The current episode started yesterday. The problem occurs constantly (fluctuating). The problem has not changed since onset.The pain is associated with exertion. The pain is present in the substernal region. The pain is moderate. The quality of the pain is described as exertional and pressure-like. The pain radiates to the left neck, right neck and left arm. Associated symptoms include dizziness, nausea and shortness of breath. Pertinent negatives include no abdominal pain, no back pain, no cough, no fever, no headaches, no lower extremity edema, no palpitations and no vomiting. She has tried nothing for the symptoms.  Her past medical history is significant for CHF, diabetes, hyperlipidemia and hypertension.  Pertinent negatives for past medical history include no CAD.  Procedure history is positive for cardiac catheterization and stress thallium.    Past Medical History:  Diagnosis Date  . Anemia    PMH: as a child  . Arthritis   . Asthma   . CAD (coronary artery disease)    nonobstructive by cath, 6/08 (false positive Cardiolite) normal stress echo, 7/11  . CHF (congestive heart failure) (HCC)   . Chronic back pain   . Complication of anesthesia    had an asthma attack when woke up from procedure  . Degenerative joint disease   . Depression   . Fatty liver   . GERD (gastroesophageal reflux disease)   . Glaucoma   . Headache(784.0)   . Heart failure, diastolic, chronic (HCC)   . Heart murmur    PMH:As a child only  . Hernia of abdominal cavity    "upper and lower hernia"  . HTN (hypertension)   . Hypothyroidism   . IBS (irritable bowel syndrome)   . IDDM (insulin  dependent diabetes mellitus) (HCC)   . Morbid obesity (HCC)   . Neuropathy (HCC)    associated with diabetes  . Obstructive sleep apnea   . Pancreatitis   . Pneumonia    as a child  . Rheumatic fever    PMH: as a child  . Seizures (HCC)    PMH: only as a child  . Shortness of breath   . Spinal stenosis   . Stroke [facility](HCC)    12/09/2013    Patient Active Problem List   Diagnosis Date Noted  . Lumbar radiculitis 10/01/2014  . Nerve pain 08/30/2014  . Lumbar stenosis with neurogenic claudication 08/28/2014  . Lumbosacral spondylosis without myelopathy 08/23/2014  . Lumbosacral stenosis with neurogenic claudication 08/23/2014  . Vaginal discharge 06/22/2014  . Chest pain 06/21/2014  . Spinal stenosis 08/23/2011  . Obstructive sleep apnea 08/23/2011  . Diabetes (HCC) 08/12/2010  . CAD 08/12/2010  . Chronic diastolic heart failure (HCC) 08/12/2010  . HYPERLIPIDEMIA-MIXED 06/23/2009  . OVERWEIGHT/OBESITY 06/23/2009  . Essential hypertension 06/23/2009  . CHEST PAIN-UNSPECIFIED 06/23/2009    Past Surgical History:  Procedure Laterality Date  . ABDOMINAL HYSTERECTOMY    . APPENDECTOMY    . BACK SURGERY    . BILATERAL KNEE ARTHROSCOPY  2000, 2009  . CARDIAC CATHETERIZATION     2009  . CATARACT EXTRACTION W/ INTRAOCULAR LENS  IMPLANT, BILATERAL    . CHOLECYSTECTOMY  1994  .  COLONOSCOPY N/A 08/01/2013   Procedure: COLONOSCOPY;  Surgeon: Malissa Hippo, MD;  Location: [facility];  Service: Endoscopy;  Laterality: N/A;  930  . CYST EXCISION     Left breast  . EYE SURGERY  1967  . heel (other)  2005  . HEMORRHOID SURGERY    . HERNIA REPAIR    . LUMBAR LAMINECTOMY/DECOMPRESSION MICRODISCECTOMY Right 08/23/2014   Procedure: LUMBAR LAMINECTOMY/DECOMPRESSION MICRODISCECTOMY 1 LEVEL;  Surgeon: Temple Pacini, MD;  Location: [facility];  Service: Neurosurgery;  Laterality: Right;  LUMBAR LAMINECTOMY/DECOMPRESSION MICRODISCECTOMY 1 LEVEL LUMBAR 2-3  . ovaries removed  2003  .  PARTIAL HYSTERECTOMY  1981  . spinahatomy  1992  . TUBAL LIGATION  1975    OB History    No data available       Home Medications    Prior to Admission medications   Medication Sig Start Date End Date Taking? Authorizing Provider  albuterol (VENTOLIN HFA) 108 (90 BASE) MCG/ACT inhaler Inhale 2 puffs into the lungs as needed for wheezing or shortness of breath.    Yes Historical Provider, MD  amLODipine (NORVASC) 2.5 MG tablet Take 1 tablet (2.5 mg total) by mouth daily. Patient taking differently: Take 2.5 mg by mouth every evening.  07/05/14  Yes Pricilla Riffle, MD  aspirin EC 81 MG tablet Take 81 mg by mouth daily.   Yes Historical Provider, MD  atorvastatin (LIPITOR) 10 MG tablet Take 10 mg by mouth every morning.    Yes Historical Provider, MD  baclofen (LIORESAL) 10 MG tablet Take 10 mg by mouth 3 (three) times daily as needed for muscle spasms.   Yes Historical Provider, MD  carvedilol (COREG) 12.5 MG tablet Take 12.5 mg by mouth 2 (two) times daily with a meal.   Yes Historical Provider, MD  famotidine (PEPCID) 40 MG tablet Take 40 mg by mouth daily.   Yes Historical Provider, MD  fluticasone furoate-vilanterol (BREO ELLIPTA) 200-25 MCG/INH AEPB Inhale 1 puff into the lungs daily.   Yes Historical Provider, MD  furosemide (LASIX) 20 MG tablet Take 20-40 mg by mouth daily as needed for fluid.   Yes Historical Provider, MD  gabapentin (NEURONTIN) 300 MG capsule Take 1 capsule (300 mg total) by mouth 3 (three) times daily. Patient taking differently: Take 600 mg by mouth 2 (two) times daily.  09/06/14  Yes Evlyn Kanner Love, PA-C  insulin glargine (LANTUS) 100 UNIT/ML injection 30 @ bedtime, 25 in the morning Patient taking differently: Inject 10-80 Units into the skin 2 (two) times daily. 10 units in the morning and 80 units at bedtime 09/06/14  Yes Evlyn Kanner Love, PA-C  Insulin Lispro (HUMALOG KWIKPEN) 200 UNIT/ML SOPN Inject 15-20 Units into the skin 3 (three) times daily. 15 units in the  morning and 20 units at supper. Take as needed in addition   Yes Historical Provider, MD  ipratropium-albuterol (DUONEB) 0.5-2.5 (3) MG/3ML SOLN Take 3 mLs by nebulization daily as needed (for shortness of breath).    Yes Historical Provider, MD  Levothyroxine Sodium 75 MCG CAPS Take 1 capsule by mouth daily.    Yes Historical Provider, MD  Liraglutide (VICTOZA) 18 MG/3ML SOPN Inject 1.8 mg into the skin daily.   Yes Historical Provider, MD  lisinopril (PRINIVIL,ZESTRIL) 10 MG tablet Take 1 tablet (10 mg total) by mouth daily. Patient taking differently: Take 20 mg by mouth daily.  07/05/14  Yes Pricilla Riffle, MD  NON FORMULARY at bedtime. CPAP: use as directed  Yes Historical Provider, MD  OXYGEN Inhale 2.5 L into the lungs at bedtime.   Yes Historical Provider, MD  rOPINIRole (REQUIP) 1 MG tablet Take 1 mg by mouth at bedtime.   Yes Historical Provider, MD  traMADol (ULTRAM) 50 MG tablet Take 1 tablet (50 mg total) by mouth every 6 (six) hours as needed for moderate pain or severe pain. 09/06/14  Yes Jacquelynn Cree, PA-C    Family History Family History  Problem Relation Age of Onset  . Cancer Other   . Heart failure Other   . Colon cancer Brother     Social History Social History  Substance Use Topics  . Smoking status: Never Smoker  . Smokeless tobacco: Never Used  . Alcohol use No     Allergies   Hydrocodone; Naproxen; Other; Oxycodone; Sulfonamide derivatives; Phenergan [promethazine hcl]; Latex; and Tape   Review of Systems Review of Systems  Constitutional: Negative for chills, fatigue and fever.  HENT: Negative for congestion and sore throat.   Eyes: Negative for visual disturbance.  Respiratory: Positive for shortness of breath. Negative for cough and chest tightness.   Cardiovascular: Positive for chest pain. Negative for palpitations.  Gastrointestinal: Positive for nausea. Negative for abdominal pain, blood in stool, diarrhea and vomiting.  Genitourinary:  Negative for decreased urine volume and difficulty urinating.  Musculoskeletal: Negative for back pain and neck stiffness.  Skin: Negative for rash.  Neurological: Positive for dizziness. Negative for light-headedness and headaches.  Psychiatric/Behavioral: Negative for confusion.  All other systems reviewed and are negative.    Physical Exam Updated Vital Signs BP (!) 115/55 (BP Location: Left Arm)   Pulse 66   Temp 97.7 F (36.5 C) (Oral)   Resp 16   Ht 5\' 5"  (1.651 m)   Wt 243 lb 6.4 oz (110.4 kg)   SpO2 98%   BMI 40.50 kg/m   Physical Exam  Constitutional: She is oriented to person, place, and time. She appears well-developed and well-nourished. No distress.  HENT:  Head: Normocephalic and atraumatic.  Nose: Nose normal.  Eyes: Conjunctivae and EOM are normal. Pupils are equal, round, and reactive to light. Right eye exhibits no discharge. Left eye exhibits no discharge. No scleral icterus.  Neck: Normal range of motion. Neck supple.  Cardiovascular: Normal rate and regular rhythm.  Exam reveals no gallop and no friction rub.   No murmur heard. Pulmonary/Chest: Effort normal and breath sounds normal. No stridor. No respiratory distress. She has no rales.  Abdominal: Soft. She exhibits no distension. There is no tenderness.  Musculoskeletal: She exhibits no edema or tenderness.  Neurological: She is alert and oriented to person, place, and time.  Skin: Skin is warm and dry. No rash noted. She is not diaphoretic. No erythema.  Psychiatric: She has a normal mood and affect.  Vitals reviewed.    ED Treatments / Results  Labs (all labs ordered are listed, but only abnormal results are displayed) Labs Reviewed  BASIC METABOLIC PANEL - Abnormal; Notable for the following:       Result Value   Chloride 99 (*)    Glucose, Bld 236 (*)    All other components within normal limits  CBC - Abnormal; Notable for the following:    Platelets 134 (*)    All other components  within normal limits  URINALYSIS, ROUTINE W REFLEX MICROSCOPIC (NOT AT [facility]) - Abnormal; Notable for the following:    Leukocytes, UA TRACE (*)    All other components within  normal limits  GLUCOSE, CAPILLARY - Abnormal; Notable for the following:    Glucose-Capillary 161 (*)    All other components within normal limits  URINE MICROSCOPIC-ADD ON - Abnormal; Notable for the following:    Squamous Epithelial / LPF 6-30 (*)    Bacteria, UA FEW (*)    All other components within normal limits  TROPONIN I  BRAIN NATRIURETIC PEPTIDE  TROPONIN I  PROTIME-INR  TROPONIN I  TROPONIN I  BASIC METABOLIC PANEL  CBC  HEPARIN LEVEL (UNFRACTIONATED)    EKG  EKG Interpretation  Date/Time:  Wednesday August 04 2016 12:34:33 EDT Ventricular Rate:  65 PR Interval:  138 QRS Duration: 80 QT Interval:  432 QTC Calculation: 449 R Axis:   80 Text Interpretation:  Normal sinus rhythm Low voltage QRS Confirmed by Attila Mccarthy MD, Amika Tassin ([id number]) on 08/04/2016 5:15:08 PM       Radiology Dg Chest 2 View  Result Date: 08/04/2016 CLINICAL DATA:  Chest pain EXAM: CHEST  2 VIEW COMPARISON:  07/01/2015 chest radiograph. FINDINGS: Stable cardiomediastinal silhouette with top-normal heart size. No pneumothorax. No pleural effusion. Lungs appear clear, with no acute consolidative airspace disease and no pulmonary edema. Moderate thoracic spondylosis. Surgical clips are seen in the upper abdomen on the lateral view. IMPRESSION: No active cardiopulmonary disease. Electronically Signed   By: Delbert Phenix M.D.   On: 08/04/2016 13:04    Procedures Procedures (including critical care time)  Medications Ordered in ED Medications  baclofen (LIORESAL) tablet 10 mg (not administered)  fluticasone furoate-vilanterol (BREO ELLIPTA) 200-25 MCG/INH 1 puff (not administered)  gabapentin (NEURONTIN) capsule 600 mg (600 mg Oral Given 08/04/16 2230)  insulin glargine (LANTUS) injection 40 Units (40 Units Subcutaneous Given  08/04/16 2346)  traMADol (ULTRAM) tablet 50 mg (50 mg Oral Given 08/04/16 2243)  atorvastatin (LIPITOR) tablet 10 mg (not administered)  amLODipine (NORVASC) tablet 2.5 mg (2.5 mg Oral Not Given 08/04/16 2130)  lisinopril (PRINIVIL,ZESTRIL) tablet 10 mg (not administered)  aspirin EC tablet 81 mg (not administered)  famotidine (PEPCID) tablet 40 mg (not administered)  rOPINIRole (REQUIP) tablet 1 mg (1 mg Oral Given 08/04/16 2230)  carvedilol (COREG) tablet 12.5 mg (12.5 mg Oral Not Given 08/04/16 2130)  levothyroxine (SYNTHROID, LEVOTHROID) tablet 75 mcg (not administered)  sodium chloride flush (NS) 0.9 % injection 3 mL (3 mLs Intravenous Given 08/04/16 2200)  sodium chloride flush (NS) 0.9 % injection 3 mL (3 mLs Intravenous Given 08/04/16 2200)  sodium chloride flush (NS) 0.9 % injection 3 mL (not administered)  0.9 %  sodium chloride infusion (not administered)  acetaminophen (TYLENOL) tablet 650 mg (not administered)    Or  acetaminophen (TYLENOL) suppository 650 mg (not administered)  senna (SENOKOT) tablet 8.6 mg (8.6 mg Oral Given 08/04/16 2231)  ondansetron (ZOFRAN) tablet 4 mg (not administered)    Or  ondansetron (ZOFRAN) injection 4 mg (not administered)  albuterol (PROVENTIL) (2.5 MG/3ML) 0.083% nebulizer solution 2.5 mg (not administered)  insulin aspart (novoLOG) injection 0-15 Units (not administered)  heparin bolus via infusion 4,000 Units (4,000 Units Intravenous Bolus from Bag 08/04/16 2233)    Followed by  heparin ADULT infusion 100 units/mL (25000 units/264mL sodium chloride 0.45%) (950 Units/hr Intravenous New Bag/Given 08/04/16 2200)     Initial Impression / Assessment and Plan / ED Course  I have reviewed the triage vital signs and the nursing notes.  Pertinent labs & imaging results that were available during my care of the patient were reviewed by me and considered in my  medical decision making (see chart for details).  Clinical Course    Atypical chest pain. EKG  without acute ischemia. HEAR score of 4. Initial troponin negative. Chest x-ray without evidence suggestive of pneumonia, pneumothorax, pneumomediastinum.  No abnormal contour of the mediastinum to suggest dissection. No evidence of acute injuries. Patient will require admission for ACS rule out.  Low pretest probability for pulmonary embolism. Presentation not classic for aortic dissection or esophageal perforation.  Patient given aspirin and nitroglycerin with improvement of her pain.  Appreciate hospitalist admission for ACS rule out.  Final Clinical Impressions(s) / ED Diagnoses   Final diagnoses:  Atypical chest pain    Disposition: Admit  Condition: Stable    Nira Conn, MD 08/05/16 0151

## 2016-08-04 NOTE — H&P (Signed)
[facility] History and Physical  [name]:[id number] DOB: [birth year] DOA: 08/04/2016   PCP: Glenda Chroman, MD  Specialists: Has seen cardiology, Dr. Domenic Polite at [facility]. Last visit was in 2015.  Chief Complaint: Chest pain, along with cramps in the legs and lower back  HPI: Kimberly [name] is a 60 y.o. female with a past medical history of chronic back pain due to spinal stenoses, history of insulin-dependent diabetes, hypertension, COPD, sleep apnea, who woke up this morning due to mainly cramping pain in her legs and lower back. Patient has a long-standing history of chronic back trouble. She underwent surgery in 2015. She is followed for this by just her primary care doctor. And then during the course of the day she developed a pressure-like sensation in the central part of her chest. She felt as if elephant was sitting on her chest. She had shortness of breath, dizziness. The pressure sensation was 8 out of 10 in intensity. It radiated to her jaw and neck. She had some nausea but denies any vomiting. Denies any syncopal episode. No recent falls or injuries. Her last stress test was in 2015. It was reportedly negative. She had a cardiac catheterization in 2005, which also was unremarkable per patient. She also mentions that that she's had some burning sensation with urination recently.  Home Medications: Prior to Admission medications   Medication Sig Start Date End Date Taking? Authorizing Provider  albuterol (VENTOLIN HFA) 108 (90 BASE) MCG/ACT inhaler Inhale 2 puffs into the lungs as needed for wheezing or shortness of breath.    Yes Historical Provider, MD  amLODipine (NORVASC) 2.5 MG tablet Take 1 tablet (2.5 mg total) by mouth daily. Patient taking differently: Take 2.5 mg by mouth every evening.  07/05/14  Yes Fay Records, MD  aspirin EC 81 MG tablet Take 81 mg by mouth daily.   Yes Historical Provider, MD  atorvastatin (LIPITOR) 10 MG tablet Take 10 mg by mouth every  morning.    Yes Historical Provider, MD  baclofen (LIORESAL) 10 MG tablet Take 10 mg by mouth 3 (three) times daily as needed for muscle spasms.   Yes Historical Provider, MD  carvedilol (COREG) 12.5 MG tablet Take 12.5 mg by mouth 2 (two) times daily with a meal.   Yes Historical Provider, MD  famotidine (PEPCID) 40 MG tablet Take 40 mg by mouth daily.   Yes Historical Provider, MD  fluticasone furoate-vilanterol (BREO ELLIPTA) 200-25 MCG/INH AEPB Inhale 1 puff into the lungs daily.   Yes Historical Provider, MD  furosemide (LASIX) 20 MG tablet Take 20-40 mg by mouth daily as needed for fluid.   Yes Historical Provider, MD  gabapentin (NEURONTIN) 300 MG capsule Take 1 capsule (300 mg total) by mouth 3 (three) times daily. Patient taking differently: Take 600 mg by mouth 2 (two) times daily.  09/06/14  Yes Ivan Anchors Love, PA-C  insulin glargine (LANTUS) 100 UNIT/ML injection 30 @ bedtime, 25 in the morning Patient taking differently: Inject 10-80 Units into the skin 2 (two) times daily. 10 units in the morning and 80 units at bedtime 09/06/14  Yes Ivan Anchors Love, PA-C  Insulin Lispro (HUMALOG KWIKPEN) 200 UNIT/ML SOPN Inject 15-20 Units into the skin 3 (three) times daily. 15 units in the morning and 20 units at supper. Take as needed in addition   Yes Historical Provider, MD  ipratropium-albuterol (DUONEB) 0.5-2.5 (3) MG/3ML SOLN Take 3 mLs by nebulization daily as needed (for shortness of breath).  Yes Historical Provider, MD  Levothyroxine Sodium 75 MCG CAPS Take 1 capsule by mouth daily.    Yes Historical Provider, MD  Liraglutide (VICTOZA) 18 MG/3ML SOPN Inject 1.8 mg into the skin daily.   Yes Historical Provider, MD  lisinopril (PRINIVIL,ZESTRIL) 10 MG tablet Take 1 tablet (10 mg total) by mouth daily. Patient taking differently: Take 20 mg by mouth daily.  07/05/14  Yes Fay Records, MD  NON FORMULARY at bedtime. CPAP: use as directed     Yes Historical Provider, MD  OXYGEN Inhale 2.5 L into  the lungs at bedtime.   Yes Historical Provider, MD  rOPINIRole (REQUIP) 1 MG tablet Take 1 mg by mouth at bedtime.   Yes Historical Provider, MD  traMADol (ULTRAM) 50 MG tablet Take 1 tablet (50 mg total) by mouth every 6 (six) hours as needed for moderate pain or severe pain. 09/06/14  Yes Ivan Anchors Love, PA-C    Allergies:  Allergies  Allergen Reactions  . Hydrocodone Other (See Comments)    sedation  . Naproxen Anaphylaxis    REACTION: throat swelling  . Other Shortness Of Breath and Other (See Comments)    Local anesthesia Had asthma attack  . Oxycodone Other (See Comments)    sedation  . Sulfonamide Derivatives Other (See Comments)    Yeast infection  . Phenergan [Promethazine Hcl] Other (See Comments)    triggers asthma attacks  . Latex Rash  . Tape Rash    Do not use adhesive tape; okay to use paper tape.    Past Medical History: Past Medical History:  Diagnosis Date  . Anemia    PMH: as a child  . Arthritis   . Asthma   . CAD (coronary artery disease)    nonobstructive by cath, 6/08 (false positive Cardiolite) normal stress echo, 7/11  . CHF (congestive heart failure) ([place])   . Chronic back pain   . Complication of anesthesia    had an asthma attack when woke up from procedure  . Degenerative joint disease   . Depression   . Fatty liver   . GERD (gastroesophageal reflux disease)   . Glaucoma   . Headache(784.0)   . Heart failure, diastolic, chronic ([place])   . Heart murmur    PMH:As a child only  . Hernia of abdominal cavity    "upper and lower hernia"  . HTN (hypertension)   . Hypothyroidism   . IBS (irritable bowel syndrome)   . IDDM (insulin dependent diabetes mellitus) ([place])   . Morbid obesity ([place])   . Neuropathy ([place])    associated with diabetes  . Obstructive sleep apnea   . Pancreatitis   . Pneumonia    as a child  . Rheumatic fever    PMH: as a child  . Seizures ([place])    PMH: only as a child  . Shortness of breath   . Spinal stenosis     . Stroke [facility])    12/09/2013    Past Surgical History:  Procedure Laterality Date  . ABDOMINAL HYSTERECTOMY    . APPENDECTOMY    . BACK SURGERY    . BILATERAL KNEE ARTHROSCOPY  2000, 2009  . CARDIAC CATHETERIZATION     2009  . CATARACT EXTRACTION W/ INTRAOCULAR LENS  IMPLANT, BILATERAL    . CHOLECYSTECTOMY  1994  . COLONOSCOPY N/A 08/01/2013   Procedure: COLONOSCOPY;  Surgeon: Rogene Houston, MD;  Location[facility];  Service: Endoscopy;  Laterality: N/A;  930  .  CYST EXCISION     Left breast  . EYE SURGERY  1967  . heel (other)  2005  . HEMORRHOID SURGERY    . HERNIA REPAIR    . LUMBAR LAMINECTOMY/DECOMPRESSION MICRODISCECTOMY Right 08/23/2014   Procedure: LUMBAR LAMINECTOMY/DECOMPRESSION MICRODISCECTOMY 1 LEVEL;  Surgeon: Charlie Pitter, MD;  Location: [facility];  Service: Neurosurgery;  Laterality: Right;  LUMBAR LAMINECTOMY/DECOMPRESSION MICRODISCECTOMY 1 LEVEL LUMBAR 2-3  . ovaries removed  2003  . PARTIAL HYSTERECTOMY  1981  . spinahatomy  1992  . TUBAL LIGATION  1975    Social History: Patient lives in [place] with her husband. She denies any smoking, alcohol use or illicit drug use. Uses a cane and walker to ambulate.  Family History:  Family History  Problem Relation Age of Onset  . Cancer Other   . Heart failure Other   . Colon cancer Brother      Review of Systems - History obtained from the patient General ROS: positive for  - fatigue Psychological ROS: negative Ophthalmic ROS: negative ENT ROS: negative Allergy and Immunology ROS: negative Hematological and Lymphatic ROS: negative Endocrine ROS: negative Respiratory ROS: As in history of present illness Cardiovascular ROS: As in history of present illness Gastrointestinal ROS: no abdominal pain, change in bowel habits, or black or bloody stools Genito-Urinary ROS: no dysuria, trouble voiding, or hematuria Musculoskeletal ROS: Back pain Neurological ROS: no TIA or stroke symptoms Dermatological  ROS: negative  Physical Examination  Vitals:   08/04/16 1830 08/04/16 1900 08/04/16 1930 08/04/16 2000  BP: 121/65 129/71 128/72 106/64  Pulse: 64 62 62   Resp:    13  Temp:      TempSrc:      SpO2: 95% 98% 97%   Weight:      Height:        BP 106/64   Pulse 62   Temp 97.8 F (36.6 C) (Oral)   Resp 13   Ht 5' (1.524 m)   Wt 108 kg (238 lb)   SpO2 97%   BMI 46.48 kg/m   General appearance: alert, cooperative, appears stated age, no distress and moderately obese Head: Normocephalic, without obvious abnormality, atraumatic Eyes: conjunctivae/corneas clear. PERRL, EOM's intact.  Throat: lips, mucosa, and tongue normal; teeth and gums normal Neck: no adenopathy, no carotid bruit, no JVD, supple, symmetrical, trachea midline and thyroid not enlarged, symmetric, no tenderness/mass/nodules Back: . No tenderness over her lower back. No obvious deformity or bruising noted. Resp: clear to auscultation bilaterally Cardio: regular rate and rhythm, S1, S2 normal, no murmur, click, rub or gallop GI: soft, non-tender; bowel sounds normal; no masses,  no organomegaly Extremities: extremities normal, atraumatic, no cyanosis or edema Pulses: 2+ and symmetric Skin: Skin color, texture, turgor normal. No rashes or lesions Lymph nodes: Cervical, supraclavicular, and axillary nodes normal. Neurologic: No facial asymmetry. Moving all her extremities. No obvious deficits noted.    Labs on Admission: I have personally reviewed following labs and imaging studies  CBC:  Recent Labs Lab 08/04/16 1304  WBC 5.1  HGB 12.8  HCT 36.7  MCV 91.5  PLT 469*   Basic Metabolic Panel:  Recent Labs Lab 08/04/16 1304  NA 137  K 3.6  CL 99*  CO2 29  GLUCOSE 236*  BUN 10  CREATININE 0.94  CALCIUM 9.2   GFR: Estimated Creatinine Clearance: 71.7 mL/min (by C-G formula based on SCr of 0.94 mg/dL).  Cardiac Enzymes:  Recent Labs Lab 08/04/16 1304  TROPONINI <0.03  Radiological Exams  on Admission: Dg Chest 2 View  Result Date: 08/04/2016 CLINICAL DATA:  Chest pain EXAM: CHEST  2 VIEW COMPARISON:  07/01/2015 chest radiograph. FINDINGS: Stable cardiomediastinal silhouette with top-normal heart size. No pneumothorax. No pleural effusion. Lungs appear clear, with no acute consolidative airspace disease and no pulmonary edema. Moderate thoracic spondylosis. Surgical clips are seen in the upper abdomen on the lateral view. IMPRESSION: No active cardiopulmonary disease. Electronically Signed   By: Ilona Sorrel M.D.   On: 08/04/2016 13:04    My interpretation of Electrocardiogram: Sinus rhythm at 65 bpm. Normal axis. Intervals normal. No Q waves. No concerning ST or T-wave changes.   Problem List  Principal Problem:   Chest pain Active Problems:   Diabetes [facility])   Essential hypertension   Chronic diastolic heart failure (HCC)   Obstructive sleep apnea   Lumbar stenosis with neurogenic claudication   Assessment: This is a 60 year old Caucasian female with past medical history as stated earlier, who presents with chest pain and with acute exacerbation of chronic back pain and leg cramps. EKG does not show any acute findings. Some of the features of her chest pain concerning for angina. Initial troponin is normal. Patient also reports dysuria. She does not have any neurological deficits on examination  Plan: #1 chest pain: She does have risk factors for coronary artery disease. However, she has been evaluated a few times in the past for chest pain. Last evaluation was in 2015. Due to her risk factors and symptoms, she will be observed in the hospital. Troponins will be trended. EKG will be repeated. Cardiology will be consulted as she will benefit from stress testing. She'll be kept nothing by mouth past midnight. She'll be placed on intravenous heparin drip for now. Continue aspirin.  #2 Back pain in the setting of known spinal stenosis: She was able to lift both her legs off the  bed without any difficulty. I think this is somewhat of an acute exacerbation of her usual symptoms. She has not had any physical therapy in the last 2 years, ever since her surgery. PT and OT will be consulted. No clear indication to do imaging studies at this time as her symptoms have been ongoing for a while. She may follow-up with her primary care physician. She may benefit from structured physical therapy.  #3 Dysuria: Check UA.  #4 Insulin-dependent diabetes mellitus: She will be placed on half the dose of her Lantus as she will be nothing by mouth past midnight. Sliding scale insulin coverage will be initiated. No recent HbA1c in our system. However, this can be pursued as an outpatient.  #5 history of obstructive sleep apnea: Use CPAP at night. She also uses oxygen at night.  #6 history of essential hypertension: Monitor blood pressures closely. Resume her home medication regimen.  DVT Prophylaxis: She'll be on IV heparin for now Code Status: Full code Family Communication: Discussed with the patient  Disposition Plan: Telemetry. Anticipate she'll be able to go back home tomorrow after cardiac testing. Consults called: Cardiology consulted  Admission status: Observation status    Further management decisions will depend on results of further testing and patient's response to treatment.   [facility]  [facility] Pager [phone]  If 7PM-7AM, please contact night-coverage [email or web address] Password [email or web address]  08/04/2016, 8:10 PM

## 2016-08-04 NOTE — Progress Notes (Signed)
ANTICOAGULATION CONSULT NOTE - Initial Consult  Pharmacy Consult for heparin Indication: chest pain/ACS  Allergies  Allergen Reactions  . Hydrocodone Other (See Comments)    sedation  . Naproxen Anaphylaxis    REACTION: throat swelling  . Other Shortness Of Breath and Other (See Comments)    Local anesthesia Had asthma attack  . Oxycodone Other (See Comments)    sedation  . Sulfonamide Derivatives Other (See Comments)    Yeast infection  . Phenergan [Promethazine Hcl] Other (See Comments)    triggers asthma attacks  . Latex Rash  . Tape Rash    Do not use adhesive tape; okay to use paper tape.    Patient Measurements: Height: 5' (152.4 cm) Weight: 238 lb (108 kg) IBW/kg (Calculated) : 45.5 HEPARIN DW (KG): 72.2  Vital Signs: Temp: 97.8 F (36.6 C) (09/13 1550) Temp Source: Oral (09/13 1550) BP: 106/64 (09/13 2000) Pulse Rate: 62 (09/13 1930)  Labs:  Recent Labs  08/04/16 1304  HGB 12.8  HCT 36.7  PLT 134*  CREATININE 0.94  TROPONINI <0.03    Estimated Creatinine Clearance: 71.7 mL/min (by C-G formula based on SCr of 0.94 mg/dL).   Medical History: Past Medical History:  Diagnosis Date  . Anemia    PMH: as a child  . Arthritis   . Asthma   . CAD (coronary artery disease)    nonobstructive by cath, 6/08 (false positive Cardiolite) normal stress echo, 7/11  . CHF (congestive heart failure) (HCC)   . Chronic back pain   . Complication of anesthesia    had an asthma attack when woke up from procedure  . Degenerative joint disease   . Depression   . Fatty liver   . GERD (gastroesophageal reflux disease)   . Glaucoma   . Headache(784.0)   . Heart failure, diastolic, chronic (HCC)   . Heart murmur    PMH:As a child only  . Hernia of abdominal cavity    "upper and lower hernia"  . HTN (hypertension)   . Hypothyroidism   . IBS (irritable bowel syndrome)   . IDDM (insulin dependent diabetes mellitus) (HCC)   . Morbid obesity (HCC)   . Neuropathy  (HCC)    associated with diabetes  . Obstructive sleep apnea   . Pancreatitis   . Pneumonia    as a child  . Rheumatic fever    PMH: as a child  . Seizures (HCC)    PMH: only as a child  . Shortness of breath   . Spinal stenosis   . Stroke [facility](HCC)    12/09/2013    Medications:  See med rec  Assessment: 60 year old Caucasian female, who presents with chest pain and with acute exacerbation of chronic back pain and leg cramps. EKG does not show any acute findings. Some of the features of her chest pain concerning for angina. Initial troponin is normal. She does have risk factors for coronary artery disease. Cardiology will be consulted, troponins will be trended,  and  She will be placed on intravenous heparin drip for now.  Goal of Therapy:  Heparin level 0.3-0.7 units/ml Monitor platelets by anticoagulation protocol: Yes   Plan:  Give 4000 units bolus x 1 Start heparin infusion at 950 units/hr Check anti-Xa level in 6-8 hours and daily while on heparin Continue to monitor H&H and platelets   Elder CyphersLorie Carrye Goller, BS Loura BackPharm D, BCPS Clinical Pharmacist Pager [phone]  08/04/2016,9:30 PM

## 2016-08-05 ENCOUNTER — Observation Stay (HOSPITAL_BASED_OUTPATIENT_CLINIC_OR_DEPARTMENT_OTHER): Payer: Medicare Other

## 2016-08-05 ENCOUNTER — Encounter (HOSPITAL_COMMUNITY): Payer: Self-pay

## 2016-08-05 DIAGNOSIS — R079 Chest pain, unspecified: Secondary | ICD-10-CM

## 2016-08-05 DIAGNOSIS — R0789 Other chest pain: Secondary | ICD-10-CM | POA: Diagnosis not present

## 2016-08-05 DIAGNOSIS — I10 Essential (primary) hypertension: Secondary | ICD-10-CM | POA: Diagnosis not present

## 2016-08-05 LAB — BASIC METABOLIC PANEL
ANION GAP: 7 (ref 5–15)
BUN: 10 mg/dL (ref 6–20)
CALCIUM: 8.4 mg/dL — AB (ref 8.9–10.3)
CO2: 31 mmol/L (ref 22–32)
Chloride: 101 mmol/L (ref 101–111)
Creatinine, Ser: 0.81 mg/dL (ref 0.44–1.00)
GFR calc Af Amer: >60 mL/min (ref >60)
GLUCOSE: 140 mg/dL — AB (ref 65–99)
POTASSIUM: 3.1 mmol/L — AB (ref 3.5–5.1)
SODIUM: 139 mmol/L (ref 135–145)

## 2016-08-05 LAB — TROPONIN I

## 2016-08-05 LAB — CBC
HEMATOCRIT: 32.9 % — AB (ref 36.0–46.0)
HEMOGLOBIN: 11.5 g/dL — AB (ref 12.0–15.0)
MCH: 32.1 pg (ref 26.0–34.0)
MCHC: 35 g/dL (ref 30.0–36.0)
MCV: 91.9 fL (ref 78.0–100.0)
Platelets: 123 10*3/uL — ABNORMAL LOW (ref 150–400)
RBC: 3.58 MIL/uL — ABNORMAL LOW (ref 3.87–5.11)
RDW: 14.7 % (ref 11.5–15.5)
WBC: 4.4 10*3/uL (ref 4.0–10.5)

## 2016-08-05 LAB — ECHOCARDIOGRAM COMPLETE
E/e' ratio: 8.83
EWDT: 254 ms
FS: 35 % (ref 28–44)
Height: 65 in
IVS/LV PW RATIO, ED: 1.03
LA ID, A-P, ES: 44 mm
LA diam end sys: 44 mm
LA vol A4C: 41.6 ml
LA vol index: 19.9 mL/m2
LADIAMINDEX: 1.91 cm/m2
LAVOL: 45.8 mL
LV E/e' medial: 8.83
LV PW d: 12.8 mm — AB (ref 0.6–1.1)
LV TDI E'LATERAL: 10
LV e' LATERAL: 10 cm/s
LVEEAVG: 8.83
MV Dec: 254
MV pk A vel: 66.9 m/s
MV pk E vel: 88.3 m/s
MVPG: 3 mmHg
RV TAPSE: 31 mm
RV sys press: 27 mmHg
Reg peak vel: 243 cm/s
TDI e' medial: 7.72
TR max vel: 243 cm/s
Weight: 3894.4 oz

## 2016-08-05 LAB — HEPARIN LEVEL (UNFRACTIONATED): HEPARIN UNFRACTIONATED: 0.18 [IU]/mL — AB (ref 0.30–0.70)

## 2016-08-05 LAB — GLUCOSE, CAPILLARY
GLUCOSE-CAPILLARY: 157 mg/dL — AB (ref 65–99)
GLUCOSE-CAPILLARY: 121 mg/dL — AB (ref 65–99)
GLUCOSE-CAPILLARY: 150 mg/dL — AB (ref 65–99)
GLUCOSE-CAPILLARY: 281 mg/dL — AB (ref 65–99)

## 2016-08-05 MED ORDER — HEPARIN SODIUM (PORCINE) 5000 UNIT/ML IJ SOLN
5000.0000 [IU] | Freq: Three times a day (TID) | INTRAMUSCULAR | Status: DC
  Administered 2016-08-05 – 2016-08-06 (×3): 5000 [IU] via SUBCUTANEOUS
  Filled 2016-08-05 (×3): qty 1

## 2016-08-05 NOTE — Care Management Note (Signed)
Case Management Note  Patient Details  Name: Audree CamelRebecca P Gruhn MRN: [id number] Date of Birth: Nov 08, 1956  Subjective/Objective:                  Pt admitted with r/o CP. She is from home, lives with husband and is ind with ADL's. [facility] She has cane that she uses with ambulation but also has walker, WC and shower chair at home. She [place]. Information sheet will be given to pt at time of DC. PT has recommended HH PT and pt has agreed. [facility] Tim Justis of [facility] is aware of referral and will obtain pt info from chart. [facility]   Action/Plan: Anticipate DC within 24 hrs.   Expected Discharge Date:     08/05/2016             Expected Discharge Plan:  Home w Home Health Services  In-House Referral:  NA  Discharge planning Services  CM Consult  Post Acute Care Choice:  Home Health Choice offered to:  Patient  DME Arranged:    DME Agency:     HH Arranged:  PT HH Agency:  [facility]  Status of Service:  Completed, signed off  If discussed at [employer] Meetings, dates discussed:    Additional Comments:  Malcolm MetroChildress, Adelis Docter Demske, RN 08/05/2016, 1:18 PM

## 2016-08-05 NOTE — Care Management Obs Status (Signed)
MEDICARE OBSERVATION STATUS NOTIFICATION   Patient Details  Name: Kimberly [name] MRN: [id number] Date of Birth: May 08, 1956   Medicare Observation Status Notification Given:  Yes    Malcolm MetroChildress, Fabienne Nolasco Demske, RN 08/05/2016, 1:02 PM

## 2016-08-05 NOTE — Consult Note (Signed)
Primary cardiologist: Dr Dorris Carnes Consulting cardiologist: Dr Carlyle Dolly Requesting physician: Dr Coralyn Pear Indication: chest pain  Clinical Summary Kimberly [name] is a 60 y.o.female with long history of chest pain cath in 2008 showing nonobstructive CAD, negative stress echo 2012 and negative lexiscan 06/2014, HTN, DM2, GERD, OSA admitted with chest pain. She reports episode started 3 day ago. A 9/10 sharp pressure pain in midchest awoke her from sleep. Associated SOB, nausea, leg cramping. Pain worst with deep breathing. Pain has been constant without relief for 3 days.    CXR no acute process K 3.6, Cr 0.94, Hgb 12.8, Plt 134, BNP 41, trop neg x 4 EKG NSR, no ischemic changes   Allergies  Allergen Reactions  . Hydrocodone Other (See Comments)    sedation  . Naproxen Anaphylaxis    REACTION: throat swelling  . Other Shortness Of Breath and Other (See Comments)    Local anesthesia Had asthma attack  . Oxycodone Other (See Comments)    sedation  . Sulfonamide Derivatives Other (See Comments)    Yeast infection  . Phenergan [Promethazine Hcl] Other (See Comments)    triggers asthma attacks  . Latex Rash  . Tape Rash    Do not use adhesive tape; okay to use paper tape.    Medications Scheduled Medications: . amLODipine  2.5 mg Oral QPM  . aspirin EC  81 mg Oral Daily  . atorvastatin  10 mg Oral q1800  . carvedilol  12.5 mg Oral BID WC  . famotidine  40 mg Oral Daily  . fluticasone furoate-vilanterol  1 puff Inhalation Daily  . gabapentin  600 mg Oral BID  . insulin aspart  0-15 Units Subcutaneous TID WC  . insulin glargine  40 Units Subcutaneous QHS  . levothyroxine  75 mcg Oral QAC breakfast  . lisinopril  10 mg Oral Daily  . rOPINIRole  1 mg Oral QHS  . senna  1 tablet Oral BID  . sodium chloride flush  3 mL Intravenous Q12H  . sodium chloride flush  3 mL Intravenous Q12H     Infusions: . heparin 950 Units/hr (08/04/16 2235)     PRN  Medications:  sodium chloride, acetaminophen **OR** acetaminophen, albuterol, baclofen, ondansetron **OR** ondansetron (ZOFRAN) IV, sodium chloride flush, traMADol   Past Medical History:  Diagnosis Date  . Anemia    PMH: as a child  . Arthritis   . Asthma   . CAD (coronary artery disease)    nonobstructive by cath, 6/08 (false positive Cardiolite) normal stress echo, 7/11  . CHF (congestive heart failure) ([place])   . Chronic back pain   . Complication of anesthesia    had an asthma attack when woke up from procedure  . Degenerative joint disease   . Depression   . Fatty liver   . GERD (gastroesophageal reflux disease)   . Glaucoma   . Headache(784.0)   . Heart failure, diastolic, chronic ([place])   . Heart murmur    PMH:As a child only  . Hernia of abdominal cavity    "upper and lower hernia"  . HTN (hypertension)   . Hypothyroidism   . IBS (irritable bowel syndrome)   . IDDM (insulin dependent diabetes mellitus) ([place])   . Morbid obesity ([place])   . Neuropathy ([place])    associated with diabetes  . Obstructive sleep apnea   . Pancreatitis   . Pneumonia    as a child  . Rheumatic fever    PMH: as a  child  . Seizures ([place])    PMH: only as a child  . Shortness of breath   . Spinal stenosis   . Stroke [facility])    12/09/2013    Past Surgical History:  Procedure Laterality Date  . ABDOMINAL HYSTERECTOMY    . APPENDECTOMY    . BACK SURGERY    . BILATERAL KNEE ARTHROSCOPY  2000, 2009  . CARDIAC CATHETERIZATION     2009  . CATARACT EXTRACTION W/ INTRAOCULAR LENS  IMPLANT, BILATERAL    . CHOLECYSTECTOMY  1994  . COLONOSCOPY N/A 08/01/2013   Procedure: COLONOSCOPY;  Surgeon: Rogene Houston, MD;  Location: [facility];  Service: Endoscopy;  Laterality: N/A;  930  . CYST EXCISION     Left breast  . EYE SURGERY  1967  . heel (other)  2005  . HEMORRHOID SURGERY    . HERNIA REPAIR    . LUMBAR LAMINECTOMY/DECOMPRESSION MICRODISCECTOMY Right 08/23/2014   Procedure: LUMBAR  LAMINECTOMY/DECOMPRESSION MICRODISCECTOMY 1 LEVEL;  Surgeon: Charlie Pitter, MD;  Location: [facility];  Service: Neurosurgery;  Laterality: Right;  LUMBAR LAMINECTOMY/DECOMPRESSION MICRODISCECTOMY 1 LEVEL LUMBAR 2-3  . ovaries removed  2003  . PARTIAL HYSTERECTOMY  1981  . spinahatomy  1992  . TUBAL LIGATION  1975    Family History  Problem Relation Age of Onset  . Cancer Other   . Heart failure Other   . Colon cancer Brother     Social History Ms. Ahuja reports that she has never smoked. She has never used smokeless tobacco. Ms. Aller reports that she does not drink alcohol.  Review of Systems CONSTITUTIONAL: No weight loss, fever, chills, weakness or fatigue.  HEENT: Eyes: No visual loss, blurred vision, double vision or yellow sclerae. No hearing loss, sneezing, congestion, runny nose or sore throat.  SKIN: No rash or itching.  CARDIOVASCULAR: per HPI RESPIRATORY: per HPI GASTROINTESTINAL: No anorexia, nausea, vomiting or diarrhea. No abdominal pain or blood.  GENITOURINARY: no polyuria, no dysuria NEUROLOGICAL: No headache, dizziness, syncope, paralysis, ataxia, numbness or tingling in the extremities. No change in bowel or bladder control.  MUSCULOSKELETAL: No muscle, back pain, joint pain or stiffness.  HEMATOLOGIC: No anemia, bleeding or bruising.  LYMPHATICS: No enlarged nodes. No history of splenectomy.  PSYCHIATRIC: No history of depression or anxiety.      Physical Examination Blood pressure (!) 113/50, pulse 65, temperature 98.9 F (37.2 C), temperature source Oral, resp. rate 18, height 5' 5"  (1.651 m), weight 243 lb 6.4 oz (110.4 kg), SpO2 100 %.  Intake/Output Summary (Last 24 hours) at 08/05/16 1051 Last data filed at 08/05/16 0817  Gross per 24 hour  Intake            97.69 ml  Output              250 ml  Net          -152.31 ml    HEENT: sclera clear, throat clear  Cardiovascular: RRR, no m//r/g, no jvd  Respiratory: CTAB  GI: abdomen soft,  NT, ND  MSK: no LE edema. Right chest wall tender to palpation  Neuro: no focal deficits  Psych: appropriate affect   Lab Results  Basic Metabolic Panel:  Recent Labs Lab 08/04/16 1304 08/05/16 0905  NA 137 139  K 3.6 3.1*  CL 99* 101  CO2 29 31  GLUCOSE 236* 140*  BUN 10 10  CREATININE 0.94 0.81  CALCIUM 9.2 8.4*    Liver Function Tests: No  results for input(s): AST, ALT, ALKPHOS, BILITOT, PROT, ALBUMIN in the last 168 hours.  CBC:  Recent Labs Lab 08/04/16 1304 08/05/16 0905  WBC 5.1 4.4  HGB 12.8 11.5*  HCT 36.7 32.9*  MCV 91.5 91.9  PLT 134* 123*    Cardiac Enzymes:  Recent Labs Lab 08/04/16 1304 08/04/16 2128 08/05/16 0300  TROPONINI <0.03 <0.03 <0.03    BNP: Invalid input(s): POCBNP     Impression/Recommendations 1. Chest pain - long history of chest pain with multiple negative evaluations - current episodes is atypical chest pain. Ongoing for 3 days stragitht without relief, right chest wall is tender to palpation - no objective evidence of ACS by EKG or enzymes - will d/c heparin -will obtain echo, if normal then no further cardiac workup and cardiology will sign off.     Carlyle Dolly, M.D.

## 2016-08-05 NOTE — Evaluation (Signed)
Occupational Therapy Evaluation Patient Details Name: Kimberly [name] MRN: [id number] DOB: Nov 27, 1955 Today's Date: 08/05/2016    History of Present Illness 60 y.o. female with a past medical history of chronic back pain due to spinal stenoses, history of insulin-dependent diabetes, hypertension, COPD, sleep apnea, who woke up this morning due to mainly cramping pain in her legs and lower back. Patient has a long-standing history of chronic back trouble. She underwent surgery in 2015. She is followed for this by just her primary care doctor. And then during the course of the day she developed a pressure-like sensation in the central part of her chest. She felt as if elephant was sitting on her chest. She had shortness of breath, dizziness. The pressure sensation was 8 out of 10 in intensity. It radiated to her jaw and neck. She had some nausea but denies any vomiting. Denies any syncopal episode. No recent falls or injuries. Her last stress test was in 2015. It was reportedly negative. She had a cardiac catheterization in 2005, which also was unremarkable per patient. She also mentions that that she's had some burning sensation with urination recently. PMH: Anemia, arthritis, asthma, CAD, CHF chronic back pain, DJD, depression, HTN, hypothyroidism, IDDM, diabetic neuropathy, SOB spinal stenosis, CVA in 2015, appendectomy, back surgery, B knee arthroscopy, lumbar lami/decompression 2015 with complications and post-op R sided weakness.    Clinical Impression   Pt awake, alert, oriented x4 this am, agreeable to OT evaluation. Pt reports pain this am, however is able to complete ADL tasks at baseline level of functioning, using cane for mobility. Pt has all necessary AE/DME which she uses as necessary. Pt does report dizziness during functional mobility. Pt does not required any additional OT services at this time, as she is at her baseline with ADL tasks. Husband is available for assistance at night,  sisters/friends available prn during the day.     Follow Up Recommendations  No OT follow up    Equipment Recommendations  None recommended by OT       Precautions / Restrictions Precautions Precautions: Fall Precaution Comments: Pt has had 3 falls and 1 where she went to the hospital - hurt her knee and hit her head.   Required Braces or Orthoses:  (Pt has braces for RLE, could not get on yesterday) Restrictions Weight Bearing Restrictions: No      Mobility Bed Mobility Overal bed mobility: Modified Independent                Transfers Overall transfer level: Needs assistance Equipment used: Straight cane Transfers: Sit to/from Stand Sit to Stand: Min guard                   ADL Overall ADL's : Needs assistance/impaired;At baseline                     Lower Body Dressing: Maximal assistance;Sitting/lateral leans Lower Body Dressing Details (indicate cue type and reason): Pt required assistance for donning socks due to cramping in leg. Reports she would use sock aid at home if necessary                     Vision Vision Assessment?: No apparent visual deficits          Pertinent Vitals/Pain Pain Assessment: 0-10 Pain Score: 8  Pain Location: chest, back, hips, legs Pain Descriptors / Indicators: Constant;Cramping Pain Intervention(s): Limited activity within patient's tolerance;Monitored during session;Repositioned     Hand  Dominance Left   Extremity/Trunk Assessment Upper Extremity Assessment Upper Extremity Assessment: Defer to OT evaluation   Lower Extremity Assessment Lower Extremity Assessment: Generalized weakness       Communication Communication Communication: No difficulties   Cognition Arousal/Alertness: Awake/alert Behavior During Therapy: WFL for tasks assessed/performed Overall Cognitive Status: Within Functional Limits for tasks assessed                                Home Living  Family/patient expects to be discharged to:: Private residence Living Arrangements: Spouse/significant other Available Help at Discharge: Available PRN/intermittently Type of Home: House Home Access: Stairs to enter [employer] CorporationEntrance Stairs-Number of Steps: 4 up the front, and 2 in the garage.  With HR's.  Pt uses the garage mostly.     Home Layout: One level     Bathroom Shower/Tub: Chief Strategy OfficerTub/shower unit   Bathroom Toilet: Standard     Home Equipment: Tub bench;Wheelchair - [employer]manual;Walker - 2 wheels;Cane - single point;Walker - 4 wheels          Prior Functioning/Environment    Gait / Transfers Assistance Needed: Pt uses the cane if she is doing well, however if she is doing bad, she uses the RW.   ADL's / Homemaking Assistance Needed: Pt is independent with dressing and bathing.  She uses the tub bench, and sock aide to get dressed.     Comments: Husband does the driving, and pt is able to go with him.  pt ambulates in the community with the cane until she gets into the store, and she then uses the cart.  Pt does all the cooking.  They split the responsibilities of the house.     OT Diagnosis: Acute pain   OT Problem List: Pain         Co-evaluation PT/OT/SLP Co-Evaluation/Treatment: Yes Reason for Co-Treatment: Complexity of the patient's impairments (multi-system involvement) PT goals addressed during session: Mobility/safety with mobility;Proper use of DME;Balance OT goals addressed during session: ADL's and self-care;Proper use of Adaptive equipment and DME    End of Session Equipment Utilized During Treatment: Gait belt  Activity Tolerance: Patient tolerated treatment well (limited by dizziness) Patient left: in bed;with call bell/phone within reach   Time: 1013-1036 OT Time Calculation (min): 23 min Charges:  OT General Charges $OT Visit: 1 Procedure OT Evaluation $OT Eval Low Complexity: 1 Procedure G-Codes: OT G-codes **NOT FOR INPATIENT CLASS** Functional Assessment  Tool Used: clinical judgement Functional Limitation: Self care Self Care Current Status (U4403(G8987): At least 20 percent but less than 40 percent impaired, limited or restricted Self Care Goal Status ([id number]): At least 20 percent but less than 40 percent impaired, limited or restricted Self Care Discharge Status [phone](G8989): At least 20 percent but less than 40 percent impaired, limited or restricted  Ezra SitesLeslie Sirus Labrie, OTR/L  [phone] 08/05/2016, 1:08 PM

## 2016-08-05 NOTE — Progress Notes (Signed)
*  PRELIMINARY RESULTS* Echocardiogram 2D Echocardiogram has been performed.  Jeryl Columbialliott, Shaun Runyon 08/05/2016, 4:34 PM

## 2016-08-05 NOTE — Evaluation (Addendum)
Physical Therapy Evaluation Patient Details Name: Kimberly [name] MRN: [id number] DOB: [birth year] Today's Date: 08/05/2016   History of Present Illness  60 y.o. female with a past medical history of chronic back pain due to spinal stenoses, history of insulin-dependent diabetes, hypertension, COPD, sleep apnea, who woke up this morning due to mainly cramping pain in her legs and lower back. Patient has a long-standing history of chronic back trouble. She underwent surgery in 2015. She is followed for this by just her primary care doctor. And then during the course of the day she developed a pressure-like sensation in the central part of her chest. She felt as if elephant was sitting on her chest. She had shortness of breath, dizziness. The pressure sensation was 8 out of 10 in intensity. It radiated to her jaw and neck. She had some nausea but denies any vomiting. Denies any syncopal episode. No recent falls or injuries. Her last stress test was in 2015. It was reportedly negative. She had a cardiac catheterization in 2005, which also was unremarkable per patient. She also mentions that that she's had some burning sensation with urination recently. PMH: Anemia, arthritis, asthma, CAD, CHF chronic back pain, DJD, depression, HTN, hypothyroidism, IDDM, diabetic neuropathy, SOB spinal stenosis, CVA in 2015, appendectomy, back surgery, B knee arthroscopy, lumbar lami/decompression 2015 with complications and post-op R sided weakness.   Clinical Impression  Pt received in bed, and was agreeable to PT/OT evaluation.  Pt expressed that she has had several falls in the past 6 months, and is supposed to wear a brace on there R knee and one on her foot, which are currently at home.  Pt is normally modified independent with all functional mobility and uses a cane or RW for ambulation.  Today, she is only able to ambulate 40ft with cane and is limited due to dizziness which she describes as "feeling like she is under  water." Pt is recommended for HHPT upon d/c due to high fall risk and limited transportation with husband working full time.      Follow Up Recommendations Home health PT    Equipment Recommendations  None recommended by PT    Recommendations for Other Services       Precautions / Restrictions Precautions Precautions: Fall Precaution Comments: Pt has had 3 falls and 1 where she went to the hospital - hurt her knee and hit her head.   Required Braces or Orthoses:  (Pt has braces for RLE, could not get on yesterday) Restrictions Weight Bearing Restrictions: No      Mobility  Bed Mobility Overal bed mobility: Modified Independent                Transfers Overall transfer level: Needs assistance Equipment used: Straight cane Transfers: Sit to/from Stand Sit to Stand: Min guard            Ambulation/Gait Ambulation/Gait assistance: Min guard Ambulation Distance (Feet): 25 Feet Assistive device: Straight cane Gait Pattern/deviations: Wide base of support;Step-through pattern;Trendelenburg   Gait velocity interpretation: <1.8 ft/sec, indicative of risk for recurrent falls General Gait Details: Gait distance limited due to pt expressing that she was having a sensation of dizziness - as if her head was under water.  Pt did not bring in R knee brace or foot brace.   Stairs            Wheelchair Mobility    Modified Rankin (Stroke Patients Only)       Balance Overall balance assessment: Needs  assistance Sitting-balance support: Bilateral upper extremity supported Sitting balance-Leahy Scale: Good     Standing balance support: Single extremity supported Standing balance-Leahy Scale: Fair                               Pertinent Vitals/Pain Pain Assessment: 0-10 Pain Score: 8  Pain Location: chest, back, hips, legs Pain Descriptors / Indicators: Constant;Cramping Pain Intervention(s): Limited activity within patient's tolerance;Monitored  during session;Repositioned    Home Living Family/patient expects to be discharged to:: Private residence Living Arrangements: Spouse/significant other Available Help at Discharge: Available PRN/intermittently Type of Home: House Home Access: Stairs to enter   Enter[employer] CorporationEntrance Stairs-Number of Steps: 4 up the front, and 2 in the garage.  With HR's.  Pt uses the garage mostly.   Home Layout: One level Home Equipment: Tub bench;Wheelchair - [employer]manual;Walker - 2 wheels;Cane - single point;Walker - 4 wheels      Prior Function     Gait / Transfers Assistance Needed: Pt uses the cane if she is doing well, however if she is doing bad, she uses the RW.    ADL's / Homemaking Assistance Needed: Pt is independent with dressing and bathing.  She uses the tub bench, and sock aide to get dressed.    Comments: Husband does the driving, and pt is able to go with him.  pt ambulates in the community with the cane until she gets into the store, and she then uses the cart.  Pt does all the cooking.  They split the responsibilities of the house.      Hand Dominance   Dominant Hand: Left    Extremity/Trunk Assessment   Upper Extremity Assessment: Defer to OT evaluation           Lower Extremity Assessment: Generalized weakness         Communication   Communication: No difficulties  Cognition Arousal/Alertness: Awake/alert Behavior During Therapy: WFL for tasks assessed/performed Overall Cognitive Status: Within Functional Limits for tasks assessed                      General Comments      Exercises        Assessment/Plan    PT Assessment Patient needs continued PT services  PT Diagnosis Difficulty walking;Generalized weakness;Abnormality of gait   PT Problem List Decreased strength;Decreased activity tolerance;Decreased balance;Decreased mobility;Decreased knowledge of use of DME;Cardiopulmonary status limiting activity;Obesity  PT Treatment Interventions Gait  training;Functional mobility training;Therapeutic activities;Therapeutic exercise;Balance training;Stair training;Patient/family education   PT Goals (Current goals can be found in the Care Plan section) Acute Rehab PT Goals Patient Stated Goal: Pt wants to go home and get stronger.  She wants to do aquatic therapy.  PT Goal Formulation: With patient Time For Goal Achievement: 08/12/16 Potential to Achieve Goals: Good    Frequency Min 3X/week   Barriers to discharge Decreased caregiver support husband works full time    Co-evaluation PT/OT/SLP Co-Evaluation/Treatment: Yes Reason for Co-Treatment: Complexity of the patient's impairments (multi-system involvement) PT goals addressed during session: Mobility/safety with mobility;Proper use of DME;Balance OT goals addressed during session: ADL's and self-care;Proper use of Adaptive equipment and DME       End of Session Equipment Utilized During Treatment: Gait belt Activity Tolerance: Patient tolerated treatment well Patient left: in bed;with call bell/phone within reach      Functional Assessment Tool Used: [employer] AM-PAC "6-clicks"  Functional Limitation: Mobility: Walking and moving around  Mobility: Walking and Moving Around Current Status [phone]): At least 40 percent but less than 60 percent impaired, limited or restricted Mobility: Walking and Moving Around Goal Status [phone]): At least 20 percent but less than 40 percent impaired, limited or restricted    Time: 1013-1036 PT Time Calculation (min) (ACUTE ONLY): 23 min   Charges:   PT Evaluation $PT Eval Low Complexity: 1 Procedure PT Treatments $Gait Training: 8-22 mins   PT G Codes:   PT G-Codes **NOT FOR INPATIENT CLASS** Functional Assessment Tool Used: [employer] "6-clicks"  Functional Limitation: Mobility: Walking and moving around Mobility: Walking and Moving Around Current Status [phone]): At least 40 percent but less than 60 percent  impaired, limited or restricted Mobility: Walking and Moving Around Goal Status [phone]): At least 20 percent but less than 40 percent impaired, limited or restricted    Beth Otto Felkins, PT, DPT X: [phone]

## 2016-08-06 DIAGNOSIS — R0789 Other chest pain: Secondary | ICD-10-CM

## 2016-08-06 DIAGNOSIS — I10 Essential (primary) hypertension: Secondary | ICD-10-CM | POA: Diagnosis not present

## 2016-08-06 LAB — CBC
HEMATOCRIT: 32.4 % — AB (ref 36.0–46.0)
HEMOGLOBIN: 11.1 g/dL — AB (ref 12.0–15.0)
MCH: 31.5 pg (ref 26.0–34.0)
MCHC: 34.3 g/dL (ref 30.0–36.0)
MCV: 92 fL (ref 78.0–100.0)
Platelets: 119 10*3/uL — ABNORMAL LOW (ref 150–400)
RBC: 3.52 MIL/uL — ABNORMAL LOW (ref 3.87–5.11)
RDW: 14.8 % (ref 11.5–15.5)
WBC: 3.9 10*3/uL — AB (ref 4.0–10.5)

## 2016-08-06 LAB — GLUCOSE, CAPILLARY
GLUCOSE-CAPILLARY: 260 mg/dL — AB (ref 65–99)
GLUCOSE-CAPILLARY: 305 mg/dL — AB (ref 65–99)

## 2016-08-06 NOTE — Progress Notes (Signed)
PROGRESS NOTE    Kimberly [name]  CXK:[id number] DOB: [birth year] DOA: 08/04/2016 PCP: Ignatius Speckinghruv B Vyas, MD   Brief Narrative:  Patient is a 60 year old with history of diabetes and hypertension presenting with chest pain having atypical features..   Assessment & Plan:   Principal Problem:   Chest pain Active Problems:   Diabetes (HCC)   Essential hypertension   Chronic diastolic heart failure (HCC)   Obstructive sleep apnea   Lumbar stenosis with neurogenic claudication  1.  Chest pain. -Kimberly [name] is a 60 year old female with multiple cardiac vascular risk factors, presenting with atypical chest pain. She has been seen and worked up in the past for chest pain. Prior cardiac workups have been negative. -Troponins were cycled overnight and remained negative 4 sets. -Initially started on IV heparin, this has been discontinued -Cardiology consulted, recommended obtaining transthoracic echocardiogram. If this study comes back negative would not recommend further cardiac workups as chest pain symptoms are likely cardiac in origin. -Other possibilities for her chest pain include gastroesophageal reflux disease and anxiety.  2.  Insulin-dependent diabetes mellitus -She was made nothing by mouth after midnight for which her insulin was decreased by half.  3.  Dyslipidemia -Continue Lipitor  4.  Hypertension -Continue Coreg 12.5 mg by mouth twice a day and Norvasc 2.5 g by mouth daily   DVT prophylaxis: On IV heparin Code Status: Full code Family Communication: Spoke with hernias Disposition Plan: Anticipate discharge once transthoracic echocardiogram results available  Consultants:   Cardiology  Subjective: Reporting ongoing dull retrosternal chest pain  Objective: Vitals:   08/05/16 2049 08/05/16 2251 08/06/16 0636 08/06/16 0746  BP: (!) 109/52  113/60   Pulse: 68 92 60   Resp: 18 17 17    Temp: 98.3 F (36.8 C)  97.6 F (36.4 C)   TempSrc: Oral  Oral   SpO2: 93%  93% 96% 95%  Weight:      Height:        Intake/Output Summary (Last 24 hours) at 08/06/16 1111 Last data filed at 08/05/16 1800  Gross per 24 hour  Intake           517.05 ml  Output                0 ml  Net           517.05 ml   Filed Weights   08/04/16 1237 08/04/16 2147  Weight: 108 kg (238 lb) 110.4 kg (243 lb 6.4 oz)    Examination:  General exam: Appears calm and comfortable  Respiratory system: Clear to auscultation. Respiratory effort normal. Cardiovascular system: S1 & S2 heard, RRR. No JVD, murmurs, rubs, gallops or clicks. No pedal edema. Gastrointestinal system: Abdomen is nondistended, soft and nontender. No organomegaly or masses felt. Normal bowel sounds heard. Central nervous system: Alert and oriented. No focal neurological deficits. Extremities: Symmetric 5 x 5 power. Skin: No rashes, lesions or ulcers Psychiatry: Judgement and insight appear normal. Mood & affect appropriate.     Data Reviewed: I have personally reviewed following labs and imaging studies  CBC:  Recent Labs Lab 08/04/16 1304 08/05/16 0905 08/06/16 0616  WBC 5.1 4.4 3.9*  HGB 12.8 11.5* 11.1*  HCT 36.7 32.9* 32.4*  MCV 91.5 91.9 92.0  PLT 134* 123* 119*   Basic Metabolic Panel:  Recent Labs Lab 08/04/16 1304 08/05/16 0905  NA 137 139  K 3.6 3.1*  CL 99* 101  CO2 29 31  GLUCOSE 236* 140*  BUN  10 10  CREATININE 0.94 0.81  CALCIUM 9.2 8.4*   GFR: Estimated Creatinine Clearance: 92.6 mL/min (by C-G formula based on SCr of 0.81 mg/dL). Liver Function Tests: No results for input(s): AST, ALT, ALKPHOS, BILITOT, PROT, ALBUMIN in the last 168 hours. No results for input(s): LIPASE, AMYLASE in the last 168 hours. No results for input(s): AMMONIA in the last 168 hours. Coagulation Profile:  Recent Labs Lab 08/04/16 1306  INR 1.03   Cardiac Enzymes:  Recent Labs Lab 08/04/16 1304 08/04/16 2128 08/05/16 0300 08/05/16 0905  TROPONINI <0.03 <0.03 <0.03 <0.03   BNP  (last 3 results) No results for input(s): PROBNP in the last 8760 hours. HbA1C: No results for input(s): HGBA1C in the last 72 hours. CBG:  Recent Labs Lab 08/05/16 1129 08/05/16 1546 08/05/16 2044 08/06/16 0833 08/06/16 1105  GLUCAP 121* 150* 281* 260* 305*   Lipid Profile: No results for input(s): CHOL, HDL, LDLCALC, TRIG, CHOLHDL, LDLDIRECT in the last 72 hours. Thyroid Function Tests: No results for input(s): TSH, T4TOTAL, FREET4, T3FREE, THYROIDAB in the last 72 hours. Anemia Panel: No results for input(s): VITAMINB12, FOLATE, FERRITIN, TIBC, IRON, RETICCTPCT in the last 72 hours. Sepsis Labs: No results for input(s): PROCALCITON, LATICACIDVEN in the last 168 hours.  No results found for this or any previous visit (from the past 240 hour(s)).       Radiology Studies: Dg Chest 2 View  Result Date: 08/04/2016 CLINICAL DATA:  Chest pain EXAM: CHEST  2 VIEW COMPARISON:  07/01/2015 chest radiograph. FINDINGS: Stable cardiomediastinal silhouette with top-normal heart size. No pneumothorax. No pleural effusion. Lungs appear clear, with no acute consolidative airspace disease and no pulmonary edema. Moderate thoracic spondylosis. Surgical clips are seen in the upper abdomen on the lateral view. IMPRESSION: No active cardiopulmonary disease. Electronically Signed   By: Delbert Phenix M.D.   On: 08/04/2016 13:04        Scheduled Meds: . amLODipine  2.5 mg Oral QPM  . aspirin EC  81 mg Oral Daily  . atorvastatin  10 mg Oral q1800  . carvedilol  12.5 mg Oral BID WC  . famotidine  40 mg Oral Daily  . fluticasone furoate-vilanterol  1 puff Inhalation Daily  . gabapentin  600 mg Oral BID  . heparin  5,000 Units Subcutaneous Q8H  . insulin aspart  0-15 Units Subcutaneous TID WC  . insulin glargine  40 Units Subcutaneous QHS  . levothyroxine  75 mcg Oral QAC breakfast  . lisinopril  10 mg Oral Daily  . rOPINIRole  1 mg Oral QHS  . senna  1 tablet Oral BID  . sodium chloride  flush  3 mL Intravenous Q12H  . sodium chloride flush  3 mL Intravenous Q12H   Continuous Infusions:    LOS: 0 days    Time spent: 25 min    Jeralyn Bennett, MD [facility] Pager [phone]  If 7PM-7AM, please contact night-coverage [email or web address] Password [email or web address] 08/06/2016, 11:11 AM

## 2016-08-06 NOTE — Discharge Summary (Signed)
Physician Discharge Summary  Kimberly CamelRebecca P Appleman ZOX:[id number] DOB: 11/16/1956 DOA: 08/04/2016  PCP: Ignatius Speckinghruv B Vyas, MD  Admit date: 08/04/2016 Discharge date: 08/06/2016  Time spent: 35 minutes  Recommendations for Outpatient Follow-up:  1. She presented with chest pain, seen by cardiology, chest pain felt not to be of cardiac origin. Please follow up on symptoms 2. Please follow up on blood sugars, she was hyperglycemic during this hospitalization, discharged on her home regimen   Discharge Diagnoses:  Principal Problem:   Chest pain Active Problems:   Diabetes [facility](HCC)   Essential hypertension   Chronic diastolic heart failure (HCC)   Obstructive sleep apnea   Lumbar stenosis with neurogenic claudication   Discharge Condition: Stable  Diet recommendation: Heart Healthy  Filed Weights   08/04/16 1237 08/04/16 2147  Weight: 108 kg (238 lb) 110.4 kg (243 lb 6.4 oz)    History of present illness:  Kimberly [name] is a 60 y.o. female with a past medical history of chronic back pain due to spinal stenoses, history of insulin-dependent diabetes, hypertension, COPD, sleep apnea, who woke up this morning due to mainly cramping pain in her legs and lower back. Patient has a long-standing history of chronic back trouble. She underwent surgery in 2015. She is followed for this by just her primary care doctor. And then during the course of the day she developed a pressure-like sensation in the central part of her chest. She felt as if elephant was sitting on her chest. She had shortness of breath, dizziness. The pressure sensation was 8 out of 10 in intensity. It radiated to her jaw and neck. She had some nausea but denies any vomiting. Denies any syncopal episode. No recent falls or injuries. Her last stress test was in 2015. It was reportedly negative. She had a cardiac catheterization in 2005, which also was unremarkable per patient. She also mentions that that she's had some burning sensation  with urination recently.   Hospital Course:  Mrs Kimberly [name] Is a pleasant 60 year old female with a past medical history of chronic back pain, insulin dependent diabetes mellitus, hypertension, chronic obstructive pulmonary disease, admitted to medicine service on 08/04/2016 when she presented with complaints of chest pain having atypical features. She has a history of chest pain undergoing multiple cardiac workups in the past that have been negative. Initial workup included EKG which did not reveal acute ischemic changes. She was placed in overnight observation with cycling of troponins that remained negative 3. She was seen and evaluated by cardiology on the following morning. She had a transthoracic echocardiogram that showed preserved ejection fraction without evidence of wall motion abnormalities. Dr Wyline MoodBranch did not feel that chest pain was of cardiac origin and did not recommend further workup from a cardiac standpoint. By 08/06/2016 she was doing much better, reporting improvement to her chest pain symptoms. I think chest pain could be related to gastroesophageal reflux disease or perhaps related to stress/anxiety. She reported having many stressors lately. On this day she was discharged home on 08/06/2016.  Procedures: Transthoracic echocardiogram performed on 08/05/2016 impression: - Left ventricle: The cavity size was normal. Wall thickness was   increased in a pattern of moderate LVH. Systolic function was   normal. The estimated ejection fraction was in the range of 55%   to 60%. Wall motion was normal; there were no regional wall   motion abnormalities. Left ventricular diastolic function   parameters were normal. - Aortic valve: Valve area (VTI): 1.77 cm^2. Valve area (Vmax): 1.6  cm^2. - Technically adequate study.  Consultations:  Cardiology  Discharge Exam: Vitals:   08/05/16 2251 08/06/16 0636  BP:  113/60  Pulse: 92 60  Resp: 17 17  Temp:  97.6 F (36.4 C)     General: She is awake and alert oriented 3 no acute distress Cardiovascular: Regular rate and rhythm normal S1-S2 Respiratory: Normal respiratory effort lungs clear Abdomen: Soft nontender nondistended  Discharge Instructions   Discharge Instructions    Call MD for:    Complete by:  As directed    Call MD for:  difficulty breathing, headache or visual disturbances    Complete by:  As directed    Call MD for:  extreme fatigue    Complete by:  As directed    Call MD for:  hives    Complete by:  As directed    Call MD for:  persistant dizziness or light-headedness    Complete by:  As directed    Call MD for:  persistant nausea and vomiting    Complete by:  As directed    Call MD for:  redness, tenderness, or signs of infection (pain, swelling, redness, odor or green/yellow discharge around incision site)    Complete by:  As directed    Call MD for:  severe uncontrolled pain    Complete by:  As directed    Call MD for:  temperature >100.4    Complete by:  As directed    Diet - low sodium heart healthy    Complete by:  As directed    Increase activity slowly    Complete by:  As directed      Current Discharge Medication List    CONTINUE these medications which have NOT CHANGED   Details  albuterol (VENTOLIN HFA) 108 (90 BASE) MCG/ACT inhaler Inhale 2 puffs into the lungs as needed for wheezing or shortness of breath.     amLODipine (NORVASC) 2.5 MG tablet Take 1 tablet (2.5 mg total) by mouth daily. Qty: 30 tablet, Refills: 11    aspirin EC 81 MG tablet Take 81 mg by mouth daily.    atorvastatin (LIPITOR) 10 MG tablet Take 10 mg by mouth every morning.     baclofen (LIORESAL) 10 MG tablet Take 10 mg by mouth 3 (three) times daily as needed for muscle spasms.    carvedilol (COREG) 12.5 MG tablet Take 12.5 mg by mouth 2 (two) times daily with a meal.    famotidine (PEPCID) 40 MG tablet Take 40 mg by mouth daily.    fluticasone furoate-vilanterol (BREO ELLIPTA)  200-25 MCG/INH AEPB Inhale 1 puff into the lungs daily.    furosemide (LASIX) 20 MG tablet Take 20-40 mg by mouth daily as needed for fluid.    gabapentin (NEURONTIN) 300 MG capsule Take 1 capsule (300 mg total) by mouth 3 (three) times daily. Qty: 90 capsule, Refills: 0    insulin glargine (LANTUS) 100 UNIT/ML injection 30 @ bedtime, 25 in the morning Qty: 10 mL, Refills: 11    Insulin Lispro (HUMALOG KWIKPEN) 200 UNIT/ML SOPN Inject 15-20 Units into the skin 3 (three) times daily. 15 units in the morning and 20 units at supper. Take as needed in addition    ipratropium-albuterol (DUONEB) 0.5-2.5 (3) MG/3ML SOLN Take 3 mLs by nebulization daily as needed (for shortness of breath).     Levothyroxine Sodium 75 MCG CAPS Take 1 capsule by mouth daily.     Liraglutide (VICTOZA) 18 MG/3ML SOPN Inject 1.8 mg into  the skin daily.    lisinopril (PRINIVIL,ZESTRIL) 10 MG tablet Take 1 tablet (10 mg total) by mouth daily. Qty: 30 tablet, Refills: 11    OXYGEN Inhale 2.5 L into the lungs at bedtime.    rOPINIRole (REQUIP) 1 MG tablet Take 1 mg by mouth at bedtime.    traMADol (ULTRAM) 50 MG tablet Take 1 tablet (50 mg total) by mouth every 6 (six) hours as needed for moderate pain or severe pain. Qty: 75 tablet, Refills: 0      STOP taking these medications     NON FORMULARY        Allergies  Allergen Reactions  . Hydrocodone Other (See Comments)    sedation  . Naproxen Anaphylaxis    REACTION: throat swelling  . Other Shortness Of Breath and Other (See Comments)    Local anesthesia Had asthma attack  . Oxycodone Other (See Comments)    sedation  . Sulfonamide Derivatives Other (See Comments)    Yeast infection  . Phenergan [Promethazine Hcl] Other (See Comments)    triggers asthma attacks  . Latex Rash  . Tape Rash    Do not use adhesive tape; okay to use paper tape.   Follow-up Information    Ignatius Specking, MD Follow up in 1 week(s).   Specialty:  Internal  Medicine Contact information: [address] [place] [address] [phone]            The results of significant diagnostics from this hospitalization (including imaging, microbiology, ancillary and laboratory) are listed below for reference.    Significant Diagnostic Studies: Dg Chest 2 View  Result Date: 08/04/2016 CLINICAL DATA:  Chest pain EXAM: CHEST  2 VIEW COMPARISON:  07/01/2015 chest radiograph. FINDINGS: Stable cardiomediastinal silhouette with top-normal heart size. No pneumothorax. No pleural effusion. Lungs appear clear, with no acute consolidative airspace disease and no pulmonary edema. Moderate thoracic spondylosis. Surgical clips are seen in the upper abdomen on the lateral view. IMPRESSION: No active cardiopulmonary disease. Electronically Signed   By: Delbert Phenix M.D.   On: 08/04/2016 13:04    Microbiology: No results found for this or any previous visit (from the past 240 hour(s)).   Labs: Basic Metabolic Panel:  Recent Labs Lab 08/04/16 1304 08/05/16 0905  NA 137 139  K 3.6 3.1*  CL 99* 101  CO2 29 31  GLUCOSE 236* 140*  BUN 10 10  CREATININE 0.94 0.81  CALCIUM 9.2 8.4*   Liver Function Tests: No results for input(s): AST, ALT, ALKPHOS, BILITOT, PROT, ALBUMIN in the last 168 hours. No results for input(s): LIPASE, AMYLASE in the last 168 hours. No results for input(s): AMMONIA in the last 168 hours. CBC:  Recent Labs Lab 08/04/16 1304 08/05/16 0905 08/06/16 0616  WBC 5.1 4.4 3.9*  HGB 12.8 11.5* 11.1*  HCT 36.7 32.9* 32.4*  MCV 91.5 91.9 92.0  PLT 134* 123* 119*   Cardiac Enzymes:  Recent Labs Lab 08/04/16 1304 08/04/16 2128 08/05/16 0300 08/05/16 0905  TROPONINI <0.03 <0.03 <0.03 <0.03   BNP: BNP (last 3 results)  Recent Labs  08/04/16 1304  BNP 41.0    ProBNP (last 3 results) No results for input(s): PROBNP in the last 8760 hours.  CBG:  Recent Labs Lab 08/05/16 0716 08/05/16 1129 08/05/16 1546 08/05/16 2044  08/06/16 0833  GLUCAP 157* 121* 150* 281* 260*       Signed:  Jeralyn Bennett MD.  [facility] 08/06/2016, 11:00 AM

## 2016-08-06 NOTE — Progress Notes (Signed)
Inpatient Diabetes Program Recommendations  AACE/ADA: New Consensus Statement on Inpatient Glycemic Control (2015)  Target Ranges:  Prepandial:   less than 140 mg/dL      Peak postprandial:   less than 180 mg/dL (1-2 hours)      Critically ill patients:  140 - 180 mg/dL   Lab Results  Component Value Date   GLUCAP 260 (H) 08/06/2016    Review of Glycemic Control  Results for Kimberly [name], [place] P (MRN [id number]) as of 08/06/2016 10:21  Ref. Range 08/05/2016 07:16 08/05/2016 11:29 08/05/2016 15:46 08/05/2016 20:44 08/06/2016 08:33  Glucose-Capillary Latest Ref Range: 65 - 99 mg/dL 409157 (H) 811121 (H) 914150 (H) 281 (H) 260 (H)    Diabetes history: Type 2 Outpatient Diabetes medications: Victoza 1.8mg /day, Humalog 15 units qam, Humalog 20 units at supper, Lantus 10 units qam , Lantus 80 units qpm   Current orders for Inpatient glycemic control: Lantus 40 units qhs, Novolog 0-15 units tid with meals  Inpatient Diabetes Program Recommendations:    Per ADA recommendations "consider performing an A1C on all patients with diabetes or hyperglycemia admitted to the hospital if not performed in the prior 3 months".  Based on fasting blood sugars, consider ordering Lantus 10 units qam and increasing Lantus to 50 units qhs.    Susette RacerJulie Devan Babino, RN, BA, MHA, CDE Diabetes Coordinator Inpatient Diabetes Program  [phone] (Team Pager) [phone] [facility] Office) 08/06/2016 10:29 AM

## 2016-08-06 NOTE — Progress Notes (Signed)
Patient discharged with instructions, prescription, and care notes.  Verbalized understanding via teach back.  IV was removed and the site was WNL. Patient voiced no further complaints or concerns at the time of discharge.  Appointments scheduled per instructions.  Patient left the floor via w/c family  And staff in stable condition. 

## 2016-09-27 DIAGNOSIS — G2581 Restless legs syndrome: Secondary | ICD-10-CM | POA: Insufficient documentation

## 2016-09-27 DIAGNOSIS — M797 Fibromyalgia: Secondary | ICD-10-CM | POA: Insufficient documentation

## 2016-09-27 DIAGNOSIS — J449 Chronic obstructive pulmonary disease, unspecified: Secondary | ICD-10-CM | POA: Insufficient documentation

## 2016-09-27 DIAGNOSIS — E039 Hypothyroidism, unspecified: Secondary | ICD-10-CM | POA: Insufficient documentation

## 2016-09-27 DIAGNOSIS — M4850XA Collapsed vertebra, not elsewhere classified, site unspecified, initial encounter for fracture: Secondary | ICD-10-CM | POA: Insufficient documentation

## 2016-09-27 DIAGNOSIS — K760 Fatty (change of) liver, not elsewhere classified: Secondary | ICD-10-CM | POA: Insufficient documentation

## 2016-09-27 NOTE — Progress Notes (Signed)
*IMAGE* Office Visit Note  Patient: Kimberly [name]             Date of Birth: 07-05-1956           MRN: [id number]             PCP: Glenda Chroman, MD Referring: Glenda Chroman, MD Visit Date: 09/29/2016 Occupation:'@[email or web address]' @    Subjective:  Pain knees   History of Present Illness: Kimberly [name] is a 60 y.o. female with history of osteoarthritis and disc disease she's been referred by her PCP for evaluation of her symptoms. Patient states that she has had arthritis for multiple years. She has history of disc disease of lumbar spine with a spinal stenosis she's had pain in her lower back for at least 18-19 years. She was seen by Dr. Trenton Gammon who performed discectomy on her lumbar spine in 2015. Patient reports residual right lower extremity weakness since then. She also has disc disease in her C-spine with spinal stenosis she was advised surgery but she did not have the surgery. She hasn't had history of osteoarthritis in her bilateral knee joints for multiple years she had right knee joint arthroscopic surgery in 2000 with persistent pain and left knee joint arthroscopic surgery in 2009 with persistent pain. She states she has poor balance and has tendency towards falling. She also suffered a stroke in 2014 which made the situation worse. She's been getting pain medications through her PCP is not controlling her joint symptoms. She complains of pain in her neck shoulders, wrists joints and hands. She also notices swelling in her hands. She complains of pain in her hip joints, knee joints, feet and intermittent feet swelling.   Activities of Daily Living:  Patient reports morning stiffness for 2 hours.   Patient Reports nocturnal pain.  Difficulty dressing/grooming: Reports Difficulty climbing stairs: Reports Difficulty getting out of chair: Reports Difficulty using hands for taps, buttons, cutlery, and/or writing: Reports   Review of Systems  Constitutional: Positive for fatigue and  weakness. Negative for night sweats, weight gain and weight loss.  HENT: Positive for mouth dryness and nose dryness. Negative for mouth sores, trouble swallowing and trouble swallowing.   Eyes: Positive for pain and dryness. Negative for redness and visual disturbance.  Respiratory: Positive for cough and shortness of breath. Negative for difficulty breathing.   Cardiovascular: Positive for hypertension and swelling in legs/feet. Negative for chest pain, palpitations and irregular heartbeat.  Gastrointestinal: Positive for heartburn and nausea. Negative for blood in stool, constipation and diarrhea.  Endocrine: Negative for increased urination.  Genitourinary: Negative for vaginal dryness.  Musculoskeletal: Positive for arthralgias, gait problem, joint pain, joint swelling, myalgias, muscle weakness, morning stiffness, muscle tenderness and myalgias.  Skin: Negative for color change, rash, hair loss, skin tightness, ulcers and sensitivity to sunlight.  Allergic/Immunologic: Negative for susceptible to infections.  Neurological: Negative for dizziness, memory loss and night sweats.  Hematological: Positive for swollen glands.  Psychiatric/Behavioral: Positive for sleep disturbance. Negative for depressed mood. The patient is not nervous/anxious.     PMFS History:  Patient Active Problem List   Diagnosis Date Noted  . COPD (chronic obstructive pulmonary disease) ([place]) 09/27/2016  . Hypothyroidism 09/27/2016  . Vertebral compression fracture ([place]) 09/27/2016  . Fatty liver 09/27/2016  . Fibromyalgia 09/27/2016  . Restless leg syndrome 09/27/2016  . Lumbar radiculitis 10/01/2014  . Nerve pain 08/30/2014  . Lumbar stenosis with neurogenic claudication 08/28/2014  . Lumbosacral spondylosis without myelopathy 08/23/2014  . Lumbosacral  stenosis with neurogenic claudication ([place]) 08/23/2014  . Vaginal discharge 06/22/2014  . Chest pain 06/21/2014  . Spinal stenosis 08/23/2011  . Obstructive  sleep apnea 08/23/2011  . Diabetes ([place]) 08/12/2010  . CAD 08/12/2010  . Chronic diastolic heart failure ([place]) 08/12/2010  . HYPERLIPIDEMIA-MIXED 06/23/2009  . OVERWEIGHT/OBESITY 06/23/2009  . Essential hypertension 06/23/2009  . CHEST PAIN-UNSPECIFIED 06/23/2009    Past Medical History:  Diagnosis Date  . Anemia    PMH: as a child  . Arthritis   . Asthma   . CAD (coronary artery disease)    nonobstructive by cath, 6/08 (false positive Cardiolite) normal stress echo, 7/11  . CHF (congestive heart failure) ([place])   . Chronic back pain   . Complication of anesthesia    had an asthma attack when woke up from procedure  . Degenerative joint disease   . Depression   . Fatty liver   . GERD (gastroesophageal reflux disease)   . Glaucoma   . Headache(784.0)   . Heart failure, diastolic, chronic ([place])   . Heart murmur    PMH:As a child only  . Hernia of abdominal cavity    "upper and lower hernia"  . HTN (hypertension)   . Hypothyroidism   . IBS (irritable bowel syndrome)   . IDDM (insulin dependent diabetes mellitus) ([place])   . Morbid obesity ([place])   . Neuropathy ([place])    associated with diabetes  . Obstructive sleep apnea   . Pancreatitis   . Pneumonia    as a child  . Rheumatic fever    PMH: as a child  . Seizures ([place])    PMH: only as a child  . Shortness of breath   . Spinal stenosis   . Stroke [facility])    12/09/2013    Family History  Problem Relation Age of Onset  . Cancer Other   . Heart failure Other   . Colon cancer Brother    Past Surgical History:  Procedure Laterality Date  . ABDOMINAL HYSTERECTOMY    . APPENDECTOMY    . BACK SURGERY    . BILATERAL KNEE ARTHROSCOPY  2000, 2009  . CARDIAC CATHETERIZATION     2009  . CATARACT EXTRACTION W/ INTRAOCULAR LENS  IMPLANT, BILATERAL    . CHOLECYSTECTOMY  1994  . COLONOSCOPY N/A 08/01/2013   Procedure: COLONOSCOPY;  Surgeon: Rogene Houston, MD;  Location[facility];  Service: Endoscopy;  Laterality: N/A;   930  . CYST EXCISION     Left breast  . EYE SURGERY  1967  . heel (other)  2005  . HEMORRHOID SURGERY    . HERNIA REPAIR    . LUMBAR LAMINECTOMY/DECOMPRESSION MICRODISCECTOMY Right 08/23/2014   Procedure: LUMBAR LAMINECTOMY/DECOMPRESSION MICRODISCECTOMY 1 LEVEL;  Surgeon: Charlie Pitter, MD;  Location: [facility];  Service: Neurosurgery;  Laterality: Right;  LUMBAR LAMINECTOMY/DECOMPRESSION MICRODISCECTOMY 1 LEVEL LUMBAR 2-3  . ovaries removed  2003  . PARTIAL HYSTERECTOMY  1981  . spinahatomy  1992  . TUBAL LIGATION  1975   Social History   Social History Narrative   Regularly exercises. Full Time.      Objective: Vital Signs: BP (!) 151/79 (BP Location: Left Arm, Patient Position: Sitting, Cuff Size: Large)   Pulse (!) 56   Resp 14   Ht 5' 0.5" (1.537 m)   Wt 244 lb (110.7 kg)   BMI 46.87 kg/m    Physical Exam  Constitutional: She is oriented to person, place, and time. She appears  well-developed and well-nourished.  HENT:  Head: Normocephalic and atraumatic.  Eyes: Conjunctivae and EOM are normal.  Neck: Normal range of motion.  Cardiovascular: Normal rate, regular rhythm, normal heart sounds and intact distal pulses.   Pulmonary/Chest: Effort normal and breath sounds normal.  Abdominal: Soft. Bowel sounds are normal.  Difficult to palpate due to body habitus  Musculoskeletal: She exhibits edema.  Lymphadenopathy:    She has no cervical adenopathy.  Neurological: She is alert and oriented to person, place, and time.  Skin: Skin is warm and dry. Capillary refill takes less than 2 seconds.  Psychiatric: She has a normal mood and affect. Her behavior is normal.  Nursing note and vitals reviewed.    Musculoskeletal Exam: C-spine good range of motion, thoracic and lumbar spine limited range of motion. She has good range of motion of her shoulders all the joints wrist joints. No MCP PIP/DIP joint swelling or thickening was noted although she had tenderness across her MCPs  and PIPs. She had good range of motion of her hip joints knee joints ankles MTPs PIPs DIPs with no synovitis,she is crepitus and discomfort with range of motion of bilateral knee joints.  CDAI Exam: No CDAI exam completed.    Investigation: Findings:  07/06/2016 TSH normal, HBA1c 6.7, May 2017 CMP normal, CBC normal, lipid panel normal    Imaging: Xr Foot 2 Views Left  Result Date: 09/29/2016 All PIP and DIP narrowing with spurring, inferior and posterior calcaneal spur. No MTP joint narrowing or erosive changes were noted. Impression: These findings were consistent with osteoarthritis of foot  Xr Foot 2 Views Right  Result Date: 09/29/2016 All PIP and DIP narrowing with spurring, inferior and posterior calcaneal spur. No MTP joint narrowing or erosive changes were noted. Impression: These findings were consistent with osteoarthritis of foot.  Xr Hand 2 View Left  Result Date: 09/29/2016 X-ray left hand reveals all PIP, all DIP and CMC narrowing. No MCP joint narrowing or erosive changes were noted. Impression: These findings are consistent with mild osteoarthritis of her left hand  Xr Hand 2 View Right  Result Date: 09/29/2016 X-ray right hand reveals all PIP, all DIP and CMC narrowing. No MCP joint narrowing or erosive changes were noted. Impression: These findings are consistent with mild osteoarthritis of her right hand  Xr Knee 3 View Left  Result Date: 09/29/2016 Severe medial compartment narrowing, moderate lateral compartment narrowing no chondrocalcinosis. Severe patellofemoral narrowing Impression: These findings are consistent with severe osteoarthritis of left knee joint.  Xr Knee 3 View Right  Result Date: 09/29/2016 Severe medial and lateral compartment narrowing. Severe patellofemoral narrowing, no chondrocalcinosis. Impression: Severe osteoarthritis of knee joint and severe chondromalacia patella   Speciality Comments: No specialty comments  available.    Procedures:  No procedures performed Allergies: Hydrocodone; Naproxen; Other; Oxycodone; Sulfonamide derivatives; Cinnamon; Feldene [piroxicam]; Lyrica [pregabalin]; Phenergan [promethazine hcl]; Ultram [tramadol hcl]; Latex; and Tape   Assessment / Plan: Visit Diagnoses:  Arthralgia, unspecified joint -patient complains of arthralgias in multiple joints. She also gives history of intermittent swelling. There was no synovitis on examination today. To rule out any  underlying inflammatory process I'll obtain following labs. Plan: CBC with Differential/Platelet, COMPLETE METABOLIC PANEL WITH GFR, Sedimentation rate, CK, TSH, ANA, Rheumatoid factor, Cyclic citrul peptide antibody, IgG, Uric acid  Lumbosacral spondylosis without myelopathy status post discectomy by Dr. Trenton Gammon. Residual right lower extremity weakness. She has chronic pain.  Fibromyalgia: She has generalized pain and positive tender points and myalgia which  seems to be her major issue today on exam.  Pain in both hands - Plan: XR Hand 2 View Right, XR Hand 2 View Left. X-rays are consistent with mild osteoarthritis which correlates with the clinical examination.  Pain in both feet - Plan: XR Foot 2 Views Right, XR Foot 2 Views Left. The clinical examination and x-rays are consistent with osteoarthritis of her feet.  Chronic pain of both knees - Plan: XR KNEE 3 VIEW RIGHT, XR KNEE 3 VIEW LEFT . She is also severe osteoarthritis involving bilateral knee joints. Which probably contributions to her lower extremity weakness and imbalance. She may consider orthopedic surgery evaluation for this.  She has multiple other medical problems for which she's been seen by PCP:  Restless leg syndrome: This causes insomnia. Compression fracture of vertebra  Chronic obstructive pulmonary disease, unspecified COPD type ([place])  Hypothyroidism, unspecified type  Fatty liver Orders: Orders Placed This Encounter  Procedures  . XR  Hand 2 View Right  . XR Hand 2 View Left  . XR KNEE 3 VIEW RIGHT  . XR KNEE 3 VIEW LEFT  . XR Foot 2 Views Right  . XR Foot 2 Views Left  . CBC with Differential/Platelet  . COMPLETE METABOLIC PANEL WITH GFR  . Sedimentation rate  . CK  . TSH  . ANA  . Rheumatoid factor  . Cyclic citrul peptide antibody, IgG  . Uric acid   Meds ordered this encounter  Medications  . Blood Glucose Calibration (ACCU-CHEK AVIVA) SOLN  . Blood Glucose Monitoring Suppl (ACCU-CHEK AVIVA PLUS) w/Device KIT  . esomeprazole (NEXIUM) 40 MG capsule  . ONE TOUCH ULTRA TEST test strip  . LANTUS SOLOSTAR 100 UNIT/ML Solostar Pen  . oxyCODONE (OXY IR/ROXICODONE) 5 MG immediate release tablet  . levothyroxine (SYNTHROID, LEVOTHROID) 75 MCG tablet  . potassium chloride (K-DUR,KLOR-CON) 10 MEQ tablet  . predniSONE (DELTASONE) 10 MG tablet    Face-to-face time spent with patient was 60 minutes. 50% of time was spent in counseling and coordination of care.  Follow-Up Instructions: New patient follow up appointment.   Bo Merino, MD, [facility] Payer

## 2016-09-28 ENCOUNTER — Encounter (INDEPENDENT_AMBULATORY_CARE_PROVIDER_SITE_OTHER): Payer: Self-pay | Admitting: Rheumatology

## 2016-09-29 ENCOUNTER — Encounter: Payer: Self-pay | Admitting: Rheumatology

## 2016-09-29 ENCOUNTER — Ambulatory Visit (INDEPENDENT_AMBULATORY_CARE_PROVIDER_SITE_OTHER): Payer: Medicare Other

## 2016-09-29 ENCOUNTER — Other Ambulatory Visit: Payer: Self-pay | Admitting: Rheumatology

## 2016-09-29 ENCOUNTER — Ambulatory Visit (INDEPENDENT_AMBULATORY_CARE_PROVIDER_SITE_OTHER): Payer: Medicare Other | Admitting: Rheumatology

## 2016-09-29 VITALS — BP 151/79 | HR 56 | Resp 14 | Ht 60.5 in | Wt 244.0 lb

## 2016-09-29 DIAGNOSIS — M2550 Pain in unspecified joint: Secondary | ICD-10-CM | POA: Diagnosis not present

## 2016-09-29 DIAGNOSIS — M797 Fibromyalgia: Secondary | ICD-10-CM

## 2016-09-29 DIAGNOSIS — M79671 Pain in right foot: Secondary | ICD-10-CM

## 2016-09-29 DIAGNOSIS — M47817 Spondylosis without myelopathy or radiculopathy, lumbosacral region: Secondary | ICD-10-CM

## 2016-09-29 DIAGNOSIS — M25562 Pain in left knee: Secondary | ICD-10-CM | POA: Diagnosis not present

## 2016-09-29 DIAGNOSIS — J449 Chronic obstructive pulmonary disease, unspecified: Secondary | ICD-10-CM | POA: Diagnosis not present

## 2016-09-29 DIAGNOSIS — M79672 Pain in left foot: Secondary | ICD-10-CM | POA: Diagnosis not present

## 2016-09-29 DIAGNOSIS — M79642 Pain in left hand: Secondary | ICD-10-CM | POA: Diagnosis not present

## 2016-09-29 DIAGNOSIS — M25561 Pain in right knee: Secondary | ICD-10-CM

## 2016-09-29 DIAGNOSIS — M79641 Pain in right hand: Secondary | ICD-10-CM

## 2016-09-29 DIAGNOSIS — G8929 Other chronic pain: Secondary | ICD-10-CM | POA: Diagnosis not present

## 2016-09-29 DIAGNOSIS — K760 Fatty (change of) liver, not elsewhere classified: Secondary | ICD-10-CM

## 2016-09-29 DIAGNOSIS — G2581 Restless legs syndrome: Secondary | ICD-10-CM | POA: Diagnosis not present

## 2016-09-29 DIAGNOSIS — M4850XD Collapsed vertebra, not elsewhere classified, site unspecified, subsequent encounter for fracture with routine healing: Secondary | ICD-10-CM

## 2016-09-29 DIAGNOSIS — IMO0001 Reserved for inherently not codable concepts without codable children: Secondary | ICD-10-CM

## 2016-09-29 DIAGNOSIS — E039 Hypothyroidism, unspecified: Secondary | ICD-10-CM

## 2016-09-29 LAB — CBC WITH DIFFERENTIAL/PLATELET
BASOS PCT: 0 %
Basophils Absolute: 0 cells/uL (ref 0–200)
EOS ABS: 0 {cells}/uL — AB (ref 15–500)
Eosinophils Relative: 0 %
HCT: 40.7 % (ref 35.0–45.0)
Hemoglobin: 13.7 g/dL (ref 11.7–15.5)
LYMPHS PCT: 22 %
Lymphs Abs: 2134 cells/uL (ref 850–3900)
MCH: 30.9 pg (ref 27.0–33.0)
MCHC: 33.7 g/dL (ref 32.0–36.0)
MCV: 91.7 fL (ref 80.0–100.0)
MONO ABS: 582 {cells}/uL (ref 200–950)
MONOS PCT: 6 %
MPV: 10.5 fL (ref 7.5–12.5)
NEUTROS ABS: 6984 {cells}/uL (ref 1500–7800)
Neutrophils Relative %: 72 %
PLATELETS: 202 10*3/uL (ref 140–400)
RBC: 4.44 MIL/uL (ref 3.80–5.10)
RDW: 15.2 % — AB (ref 11.0–15.0)
WBC: 9.7 10*3/uL (ref 3.8–10.8)

## 2016-09-29 LAB — URIC ACID: URIC ACID, SERUM: 5.2 mg/dL (ref 2.5–7.0)

## 2016-09-29 LAB — COMPLETE METABOLIC PANEL WITH GFR
ALT: 14 U/L (ref 6–29)
AST: 23 U/L (ref 10–35)
Albumin: 4.1 g/dL (ref 3.6–5.1)
Alkaline Phosphatase: 80 U/L (ref 33–130)
BILIRUBIN TOTAL: 1.4 mg/dL — AB (ref 0.2–1.2)
BUN: 14 mg/dL (ref 7–25)
CHLORIDE: 101 mmol/L (ref 98–110)
CO2: 29 mmol/L (ref 20–31)
CREATININE: 0.91 mg/dL (ref 0.50–1.05)
Calcium: 10.2 mg/dL (ref 8.6–10.4)
GFR, EST AFRICAN AMERICAN: 80 mL/min (ref >=60)
GFR, Est Non African American: 69 mL/min (ref >=60)
GLUCOSE: 147 mg/dL — AB (ref 65–99)
Potassium: 4.1 mmol/L (ref 3.5–5.3)
Sodium: 139 mmol/L (ref 135–146)
TOTAL PROTEIN: 7.2 g/dL (ref 6.1–8.1)

## 2016-09-29 LAB — TSH: TSH: 2.54 mIU/L

## 2016-09-29 LAB — CK: CK TOTAL: 69 U/L (ref 7–177)

## 2016-09-30 LAB — SEDIMENTATION RATE: Sed Rate: 16 mm/hr (ref 0–30)

## 2016-09-30 LAB — RHEUMATOID FACTOR

## 2016-09-30 LAB — ANTI-NUCLEAR AB-TITER (ANA TITER)

## 2016-09-30 LAB — CYCLIC CITRUL PEPTIDE ANTIBODY, IGG

## 2016-09-30 LAB — ANA: Anti Nuclear Antibody(ANA): POSITIVE — AB

## 2016-10-01 ENCOUNTER — Telehealth: Payer: Self-pay | Admitting: Radiology

## 2016-10-01 NOTE — Telephone Encounter (Signed)
Called to add on.

## 2016-10-01 NOTE — Telephone Encounter (Signed)
Call lab to add on ENA the ANA was positive

## 2016-10-04 LAB — CP5000020 ENA PANEL
ENA SM AB SER-ACNC: NEGATIVE
RIBONUCLEIC PROTEIN(ENA) ANTIBODY, IGG: NEGATIVE
SSA (Ro) (ENA) Antibody, IgG: <1
SSB (LA) (ENA) ANTIBODY, IGG: NEGATIVE
Scleroderma (Scl-70) (ENA) Antibody, IgG: <1

## 2016-10-25 NOTE — Progress Notes (Signed)
Office Visit Note  Patient: Kimberly [name]             Date of Birth: 1956/08/12           MRN: [id number]             PCP: Glenda Chroman, MD Referring: Glenda Chroman, MD Visit Date: 10/27/2016 Occupation: _0 @    Subjective:  Pain of the Right Hand; Pain of the Left Hand; Pain of the Right Foot; Pain of the Right Knee; and Pain of the Left Knee   History of Present Illness: Kimberly [name] is a 60 y.o. female with history of fibromyalgia and osteoarthritis. She states after her last visit here she was seen in the emergency room due to lower back pain and hip pain. She was given intramuscular Toradol which helped her to some extent. She states after that she was seen by her PCP who gave her trochanteric bursa injection with cortisone and that relieved pain for some time. Now she is suffering from bronchitis for which she is on antibiotics. She continues to have a lot of discomfort in her bilateral knee joints, her hands and her feet. She also continues to have some generalized pain from fibromyalgia. She has seen Dr. Durward Fortes for osteoarthritis in her knee joints and he did not recommend total knee replacement due to her underlying health per patient. She has had Visco supplement injections without any results.  Activities of Daily Living:  Patient reports morning stiffness for30 minutes.   Patient Reports nocturnal pain.  Difficulty dressing/grooming: Reports Difficulty climbing stairs: Reports Difficulty getting out of chair: Reports Difficulty using hands for taps, buttons, cutlery, and/or writing: Reports   Review of Systems  Constitutional: Positive for fatigue. Negative for night sweats, weight gain, weight loss and weakness.  HENT: Positive for mouth dryness. Negative for mouth sores, trouble swallowing, trouble swallowing and nose dryness.   Eyes: Positive for dryness. Negative for pain, redness and visual disturbance.  Respiratory: Positive for cough. Negative for  shortness of breath and difficulty breathing.   Cardiovascular: Positive for hypertension. Negative for chest pain, palpitations, irregular heartbeat and swelling in legs/feet.  Gastrointestinal: Positive for constipation. Negative for blood in stool and diarrhea.  Endocrine: Negative for increased urination.  Genitourinary: Negative for vaginal dryness.  Musculoskeletal: Positive for arthralgias, joint pain, myalgias, morning stiffness and myalgias. Negative for muscle weakness and muscle tenderness.  Skin: Negative for color change, rash, hair loss, skin tightness, ulcers and sensitivity to sunlight.  Allergic/Immunologic: Negative for susceptible to infections.  Neurological: Negative for dizziness, memory loss and night sweats.  Hematological: Negative for swollen glands.  Psychiatric/Behavioral: Positive for sleep disturbance. Negative for depressed mood. The patient is not nervous/anxious.     PMFS History:  Patient Active Problem List   Diagnosis Date Noted  . Primary osteoarthritis of both knees 10/26/2016  . Primary osteoarthritis of both hands 10/26/2016  . Primary osteoarthritis of both feet 10/26/2016  . COPD (chronic obstructive pulmonary disease) ([place]) 09/27/2016  . Hypothyroidism 09/27/2016  . Vertebral compression fracture ([place]) 09/27/2016  . Fatty liver 09/27/2016  . Fibromyalgia 09/27/2016  . Restless leg syndrome 09/27/2016  . Lumbar radiculitis 10/01/2014  . Nerve pain 08/30/2014  . Lumbar stenosis with neurogenic claudication 08/28/2014  . Spondylosis of lumbar region without myelopathy or radiculopathy 08/23/2014  . Lumbosacral stenosis with neurogenic claudication ([place]) 08/23/2014  . Vaginal discharge 06/22/2014  . Chest pain 06/21/2014  . Spinal stenosis 08/23/2011  . Obstructive sleep  apnea 08/23/2011  . Diabetes ([place]) 08/12/2010  . CAD 08/12/2010  . Chronic diastolic heart failure ([place]) 08/12/2010  . HYPERLIPIDEMIA-MIXED 06/23/2009  . OVERWEIGHT/OBESITY  06/23/2009  . Essential hypertension 06/23/2009  . CHEST PAIN-UNSPECIFIED 06/23/2009    Past Medical History:  Diagnosis Date  . Anemia    PMH: as a child  . Arthritis   . Asthma   . CAD (coronary artery disease)    nonobstructive by cath, 6/08 (false positive Cardiolite) normal stress echo, 7/11  . CHF (congestive heart failure) ([place])   . Chronic back pain   . Complication of anesthesia    had an asthma attack when woke up from procedure  . Degenerative joint disease   . Depression   . Fatty liver   . GERD (gastroesophageal reflux disease)   . Glaucoma   . Headache(784.0)   . Heart failure, diastolic, chronic ([place])   . Heart murmur    PMH:As a child only  . Hernia of abdominal cavity    "upper and lower hernia"  . HTN (hypertension)   . Hypothyroidism   . IBS (irritable bowel syndrome)   . IDDM (insulin dependent diabetes mellitus) ([place])   . Morbid obesity ([place])   . Neuropathy ([place])    associated with diabetes  . Obstructive sleep apnea   . Pancreatitis   . Pneumonia    as a child  . Rheumatic fever    PMH: as a child  . Seizures ([place])    PMH: only as a child  . Shortness of breath   . Spinal stenosis   . Stroke [facility])    12/09/2013    Family History  Problem Relation Age of Onset  . Cancer Other   . Heart failure Other   . Colon cancer Brother    Past Surgical History:  Procedure Laterality Date  . ABDOMINAL HYSTERECTOMY    . APPENDECTOMY    . BACK SURGERY    . BILATERAL KNEE ARTHROSCOPY  2000, 2009  . CARDIAC CATHETERIZATION     2009  . CATARACT EXTRACTION W/ INTRAOCULAR LENS  IMPLANT, BILATERAL    . CHOLECYSTECTOMY  1994  . COLONOSCOPY N/A 08/01/2013   Procedure: COLONOSCOPY;  Surgeon: Rogene Houston, MD;  Location[facility];  Service: Endoscopy;  Laterality: N/A;  930  . CYST EXCISION     Left breast  . EYE SURGERY  1967  . heel (other)  2005  . HEMORRHOID SURGERY    . HERNIA REPAIR    . LUMBAR LAMINECTOMY/DECOMPRESSION MICRODISCECTOMY  Right 08/23/2014   Procedure: LUMBAR LAMINECTOMY/DECOMPRESSION MICRODISCECTOMY 1 LEVEL;  Surgeon: Charlie Pitter, MD;  Location: [place] [facility]URO ORS;  Service: Neurosurgery;  Laterality: Right;  LUMBAR LAMINECTOMY/DECOMPRESSION MICRODISCECTOMY 1 LEVEL LUMBAR 2-3  . ovaries removed  2003  . PARTIAL HYSTERECTOMY  1981  . spinahatomy  1992  . TUBAL LIGATION  1975   Social History   Social History Narrative   Regularly exercises. Full Time.      Objective: Vital Signs: BP 130/74   Pulse 82   Ht 5' (1.524 m)   Wt 242 lb (109.8 kg)   BMI 47.26 kg/m    Physical Exam  Constitutional: She is oriented to person, place, and time. She appears well-developed and well-nourished.  HENT:  Head: Normocephalic and atraumatic.  Eyes: Conjunctivae and EOM are normal.  Neck: Normal range of motion.  Cardiovascular: Normal rate, regular rhythm, normal heart sounds and intact distal pulses.   Pulmonary/Chest: Effort normal and  breath sounds normal.  Abdominal: Soft. Bowel sounds are normal.  Lymphadenopathy:    She has no cervical adenopathy.  Neurological: She is alert and oriented to person, place, and time.  Skin: Skin is warm and dry. Capillary refill takes less than 2 seconds.  Psychiatric: She has a normal mood and affect. Her behavior is normal.  Nursing note and vitals reviewed.    Musculoskeletal Exam: C-spine good range of motion she has limitation of motion of her thoracic and lumbar spine. Shoulder joints, elbow joints, wrist joints, MCPs PIPs DIPs good range of motion with no synovitis. She has good range of motion of her hip joints knee joints ankles MTPs PIPs DIPs. Fibromyalgia tender points were 18 out of 18 positive she has generalized hyperalgesia.  CDAI Exam: No CDAI exam completed.    Investigation: Findings:  09/30/2016 ANA 1:160 nuclear homogenous, ENA negative, rheumatoid factor negative, CCP negative, uric acid 5.2, CBC negative, CMP normal, CK normal, TSH normal   Orders  Only on 09/29/2016  Component Date Value Ref Range Status  . ds DNA Ab 10/04/2016 <1  IU/mL Final   Comment:                                 IU/mL       Interpretation                               < or = 4    Negative                               5-9         Indeterminate                               > or = 10   Positive     . Ribonucleic Protein(ENA) Antibody,* 10/04/2016 <1.0 NEG  <1.0 NEG AI Final  . Scleroderma (Scl-70) (ENA) Antibod* 10/04/2016 <1.0 NEG  <1.0 NEG AI Final  . SSA ([place]) (ENA) Antibody, IgG 10/04/2016 <1.0 NEG  <1.0 NEG AI Final  . SSB ([place]) (ENA) Antibody, IgG 10/04/2016 <1.0 NEG  <1.0 NEG AI Final  . ENA SM Ab Ser-aCnc 10/04/2016 <1.0 NEG  <1.0 NEG AI Final  Office Visit on 09/29/2016  Component Date Value Ref Range Status  . WBC 09/29/2016 9.7  3.8 - 10.8 K/uL Final  . RBC 09/29/2016 4.44  3.80 - 5.10 MIL/uL Final  . Hemoglobin 09/29/2016 13.7  11.7 - 15.5 g/dL Final  . HCT 09/29/2016 40.7  35.0 - 45.0 % Final  . MCV 09/29/2016 91.7  80.0 - 100.0 fL Final  . MCH 09/29/2016 30.9  27.0 - 33.0 pg Final  . MCHC 09/29/2016 33.7  32.0 - 36.0 g/dL Final  . RDW 09/29/2016 15.2* 11.0 - 15.0 % Final  . Platelets 09/29/2016 202  140 - 400 K/uL Final  . MPV 09/29/2016 10.5  7.5 - 12.5 fL Final  . Neutro Abs 09/29/2016 6984  1,500 - 7,800 cells/uL Final  . Lymphs Abs 09/29/2016 2134  850 - 3,900 cells/uL Final  . Monocytes Absolute 09/29/2016 582  200 - 950 cells/uL Final  . Eosinophils Absolute 09/29/2016 0* 15 - 500 cells/uL Final  . Basophils Absolute 09/29/2016 0  0 -  200 cells/uL Final  . Neutrophils Relative % 09/29/2016 72  % Final  . Lymphocytes Relative 09/29/2016 22  % Final  . Monocytes Relative 09/29/2016 6  % Final  . Eosinophils Relative 09/29/2016 0  % Final  . Basophils Relative 09/29/2016 0  % Final  . Smear Review 09/29/2016 Criteria for review not met   Final  . Sodium 09/29/2016 139  135 - 146 mmol/L Final  . Potassium 09/29/2016 4.1  3.5 - 5.3  mmol/L Final  . Chloride 09/29/2016 101  98 - 110 mmol/L Final  . CO2 09/29/2016 29  20 - 31 mmol/L Final  . Glucose, Bld 09/29/2016 147* 65 - 99 mg/dL Final  . BUN 09/29/2016 14  7 - 25 mg/dL Final  . Creat 09/29/2016 0.91  0.50 - 1.05 mg/dL Final   Comment:   For patients > or = 59 years of age: The upper reference limit for Creatinine is approximately 13% higher for people identified as African-American.     . Total Bilirubin 09/29/2016 1.4* 0.2 - 1.2 mg/dL Final  . Alkaline Phosphatase 09/29/2016 80  33 - 130 U/L Final  . AST 09/29/2016 23  10 - 35 U/L Final  . ALT 09/29/2016 14  6 - 29 U/L Final  . Total Protein 09/29/2016 7.2  6.1 - 8.1 g/dL Final  . Albumin 09/29/2016 4.1  3.6 - 5.1 g/dL Final  . Calcium 09/29/2016 10.2  8.6 - 10.4 mg/dL Final  . GFR, Est African American 09/29/2016 80  >=60 mL/min Final  . GFR, Est Non African American 09/29/2016 69  >=60 mL/min Final  . Sed Rate 09/30/2016 16  0 - 30 mm/hr Final  . Total CK 09/29/2016 69  7 - 177 U/L Final  . TSH 09/29/2016 2.54  mIU/L Final   Comment:   Reference Range   > or = 20 Years  0.40-4.50   Pregnancy Range First trimester  0.26-2.66 Second trimester 0.55-2.73 Third trimester  0.43-2.91     . Anit Nuclear Antibody(ANA) 09/30/2016 POS* NEGATIVE Final  . Rhuematoid fact SerPl-aCnc 09/30/2016 <14  <14 IU/mL Final  . Cyclic Citrullin Peptide Ab 09/30/2016 <16  Units Final   Comment:   Reference Range Negative               < 20 Weak Positive            20 - 39 Moderate Positive        40 - 59 Strong Positive        > 59   . Uric Acid, Serum 09/29/2016 5.2  2.5 - 7.0 mg/dL Final  . ANA Pattern 1 09/30/2016 HOMOGENEOUS*  Final  . ANA Titer 1 09/30/2016 1:160* titer Final   Comment:           Reference Range           < 1:40      Negative             1:40-1:80 Low Antibody level           > 1:80      Elevated Antibody level      Imaging: Xr Foot 2 Views Left  Result Date: 09/29/2016 All PIP and  DIP narrowing with spurring, inferior and posterior calcaneal spur. No MTP joint narrowing or erosive changes were noted. Impression: These findings were consistent with osteoarthritis of foot  Xr Foot 2 Views Right  Result Date: 09/29/2016 All PIP and DIP narrowing with spurring,  inferior and posterior calcaneal spur. No MTP joint narrowing or erosive changes were noted. Impression: These findings were consistent with osteoarthritis of foot.  Xr Hand 2 View Left  Result Date: 09/29/2016 X-ray left hand reveals all PIP, all DIP and CMC narrowing. No MCP joint narrowing or erosive changes were noted. Impression: These findings are consistent with mild osteoarthritis of her left hand  Xr Hand 2 View Right  Result Date: 09/29/2016 X-ray right hand reveals all PIP, all DIP and CMC narrowing. No MCP joint narrowing or erosive changes were noted. Impression: These findings are consistent with mild osteoarthritis of her right hand  Xr Knee 3 View Left  Result Date: 09/29/2016 Severe medial compartment narrowing, moderate lateral compartment narrowing no chondrocalcinosis. Severe patellofemoral narrowing Impression: These findings are consistent with severe osteoarthritis of left knee joint.  Xr Knee 3 View Right  Result Date: 09/29/2016 Severe medial and lateral compartment narrowing. Severe patellofemoral narrowing, no chondrocalcinosis. Impression: Severe osteoarthritis of knee joint and severe chondromalacia patella   Speciality Comments: No specialty comments available.    Procedures:  No procedures performed Allergies: Hydrocodone; Naproxen; Other; Oxycodone; Sulfonamide derivatives; Cinnamon; Feldene [piroxicam]; Lyrica [pregabalin]; Phenergan [promethazine hcl]; Ultram [tramadol hcl]; Latex; and Tape   Assessment / Plan:     Visit Diagnoses: Fibromyalgia - ANA 1:160NH, ENA negative. She has no clinical features of autoimmune disease. She does meet criteria for fibromyalgia syndrome  which causes generalized pain and discomfort. She is on pain management by her PCP which is working fairly well. I did discuss the option of Cymbalta which can be added. There is nothing further to add. I'll see her only on when necessary basis.  Primary osteoarthritis of both knees - Bilateral severe. She had no response to [place] supplement injections done by Dr. Durward Fortes. According to patient she's not a candidate for total knee replacement. I'll call in a prescription for Voltaren gel.  Primary osteoarthritis of both hands - Bilateral mild, with some ongoing discomfort. Muscle strengthening and joint protection was discussed.  Primary osteoarthritis of both feet - Bilateral mild, proper fitting shoes were discussed.  Spondylosis of lumbar region - Status post discectomy by Dr. Trenton Gammon. Residual right lower extremity weakness and chronic pain.  Obesity: Her BMI is more than 47. Weight loss diet and exercise was discussed at length.  Compression fracture of vertebra   Chronic diastolic heart failure (HCC)  Type 2 diabetes mellitus without complication, with long-term current use of insulin (HCC)  Chronic obstructive pulmonary disease, unspecified COPD type (HCC)  Hypothyroidism, unspecified type  Fatty liver  Restless leg syndrome      Orders: No orders of the defined types were placed in this encounter.  No orders of the defined types were placed in this encounter.   Face-to-face time spent with patient was 35 minutes. 50% of time was spent in counseling and coordination of care.  Follow-Up Instructions: Return if symptoms worsen or fail to improve, for Osteoarthritis.   Bo Merino, MD

## 2016-10-26 DIAGNOSIS — M19071 Primary osteoarthritis, right ankle and foot: Secondary | ICD-10-CM | POA: Insufficient documentation

## 2016-10-26 DIAGNOSIS — M19072 Primary osteoarthritis, left ankle and foot: Secondary | ICD-10-CM

## 2016-10-26 DIAGNOSIS — M19042 Primary osteoarthritis, left hand: Secondary | ICD-10-CM

## 2016-10-26 DIAGNOSIS — M170 Bilateral primary osteoarthritis of knee: Secondary | ICD-10-CM | POA: Insufficient documentation

## 2016-10-26 DIAGNOSIS — M19041 Primary osteoarthritis, right hand: Secondary | ICD-10-CM | POA: Insufficient documentation

## 2016-10-27 ENCOUNTER — Encounter: Payer: Self-pay | Admitting: Rheumatology

## 2016-10-27 ENCOUNTER — Ambulatory Visit (INDEPENDENT_AMBULATORY_CARE_PROVIDER_SITE_OTHER): Payer: Medicare Other | Admitting: Rheumatology

## 2016-10-27 VITALS — BP 130/74 | HR 82 | Ht 60.0 in | Wt 242.0 lb

## 2016-10-27 DIAGNOSIS — M19071 Primary osteoarthritis, right ankle and foot: Secondary | ICD-10-CM | POA: Diagnosis not present

## 2016-10-27 DIAGNOSIS — E119 Type 2 diabetes mellitus without complications: Secondary | ICD-10-CM | POA: Diagnosis not present

## 2016-10-27 DIAGNOSIS — I5032 Chronic diastolic (congestive) heart failure: Secondary | ICD-10-CM | POA: Diagnosis not present

## 2016-10-27 DIAGNOSIS — M19042 Primary osteoarthritis, left hand: Secondary | ICD-10-CM

## 2016-10-27 DIAGNOSIS — M19041 Primary osteoarthritis, right hand: Secondary | ICD-10-CM

## 2016-10-27 DIAGNOSIS — M797 Fibromyalgia: Secondary | ICD-10-CM | POA: Diagnosis not present

## 2016-10-27 DIAGNOSIS — M170 Bilateral primary osteoarthritis of knee: Secondary | ICD-10-CM

## 2016-10-27 DIAGNOSIS — IMO0001 Reserved for inherently not codable concepts without codable children: Secondary | ICD-10-CM

## 2016-10-27 DIAGNOSIS — M47816 Spondylosis without myelopathy or radiculopathy, lumbar region: Secondary | ICD-10-CM | POA: Diagnosis not present

## 2016-10-27 DIAGNOSIS — M4850XD Collapsed vertebra, not elsewhere classified, site unspecified, subsequent encounter for fracture with routine healing: Secondary | ICD-10-CM

## 2016-10-27 DIAGNOSIS — G2581 Restless legs syndrome: Secondary | ICD-10-CM | POA: Diagnosis not present

## 2016-10-27 DIAGNOSIS — E039 Hypothyroidism, unspecified: Secondary | ICD-10-CM

## 2016-10-27 DIAGNOSIS — J449 Chronic obstructive pulmonary disease, unspecified: Secondary | ICD-10-CM | POA: Diagnosis not present

## 2016-10-27 DIAGNOSIS — K760 Fatty (change of) liver, not elsewhere classified: Secondary | ICD-10-CM

## 2016-10-27 DIAGNOSIS — M19072 Primary osteoarthritis, left ankle and foot: Secondary | ICD-10-CM

## 2016-10-27 DIAGNOSIS — Z794 Long term (current) use of insulin: Secondary | ICD-10-CM

## 2016-10-27 MED ORDER — DICLOFENAC SODIUM 1 % TD GEL: 4.0000 g | Freq: Four times a day (QID) | TRANSDERMAL | 2 refills | Status: DC

## 2017-02-23 ENCOUNTER — Encounter: Payer: Self-pay | Admitting: Cardiology

## 2017-02-23 ENCOUNTER — Ambulatory Visit (INDEPENDENT_AMBULATORY_CARE_PROVIDER_SITE_OTHER): Payer: Medicare Other | Admitting: Cardiology

## 2017-02-23 VITALS — BP 126/83 | HR 67 | Ht 60.0 in | Wt 235.0 lb

## 2017-02-23 DIAGNOSIS — R0789 Other chest pain: Secondary | ICD-10-CM

## 2017-02-23 NOTE — Progress Notes (Signed)
Clinical Summary Kimberly [name] is a 61 y.o.female seen as new patient for chest pain, she is referred by Dr Woody Seller.   1. Chest pain - history of false positive stress 04/2007, cath showed nonobstructive disease - negative stress echo 2012 - 06/2014 nuclear stress test no ischemia - 07/2016 echo: LVEF 55-60%, no WMAs   - left sided sharp, 8/10 in severity. Can have some nausea related. Pain is constant, worst with exertion. Worst with deep breaths, worst with position. Not tender to palpation. Constant since recent flu admission at [facility] 2 months ago. Better with ibuprofen 277m, 2-3 times a day. Taking for about 1 month.  - abdonminal pain with eating, has to stop. Gets nauseous.   Past Medical History:  Diagnosis Date  . Anemia    PMH: as a child  . Arthritis   . Asthma   . CAD (coronary artery disease)    nonobstructive by cath, 6/08 (false positive Cardiolite) normal stress echo, 7/11  . CHF (congestive heart failure) ([place]   . Chronic back pain   . Complication of anesthesia    had an asthma attack when woke up from procedure  . Degenerative joint disease   . Depression   . Fatty liver   . GERD (gastroesophageal reflux disease)   . Glaucoma   . Headache(784.0)   . Heart failure, diastolic, chronic ([place]   . Heart murmur    PMH:As a child only  . Hernia of abdominal cavity    "upper and lower hernia"  . HTN (hypertension)   . Hypothyroidism   . IBS (irritable bowel syndrome)   . IDDM (insulin dependent diabetes mellitus) ([place]   . Morbid obesity ([place]   . Neuropathy ([place]    associated with diabetes  . Obstructive sleep apnea   . Pancreatitis   . Pneumonia    as a child  . Rheumatic fever    PMH: as a child  . Seizures ([place]    PMH: only as a child  . Shortness of breath   . Spinal stenosis   . Stroke ([place]    12/09/2013     Allergies  Allergen Reactions  . Hydrocodone Other (See Comments)    sedation  . Naproxen Anaphylaxis    REACTION: throat  swelling  . Other Shortness Of Breath and Other (See Comments)    Local anesthesia Had asthma attack  . Oxycodone Other (See Comments)    sedation  . Sulfonamide Derivatives Other (See Comments)    Yeast infection  . Cinnamon   . Feldene [Piroxicam] Nausea Only  . Lyrica [Pregabalin] Other (See Comments)  . Phenergan [Promethazine Hcl] Other (See Comments)    triggers asthma attacks  . Ultram [Tramadol Hcl] Other (See Comments)  . Latex Rash  . Tape Rash    Do not use adhesive tape; okay to use paper tape.     Current Outpatient Prescriptions  Medication Sig Dispense Refill  . albuterol (VENTOLIN HFA) 108 (90 BASE) MCG/ACT inhaler Inhale 2 puffs into the lungs as needed for wheezing or shortness of breath.     .Marland KitchenamLODipine (NORVASC) 2.5 MG tablet Take 1 tablet (2.5 mg total) by mouth daily. (Patient taking differently: Take 2.5 mg by mouth every evening. ) 30 tablet 11  . aspirin EC 81 MG tablet Take 81 mg by mouth daily.    .Marland Kitchenatorvastatin (LIPITOR) 10 MG tablet Take 10 mg by mouth every morning.     . baclofen (LIORESAL) 10  MG tablet Take 10 mg by mouth 2 (two) times daily.     . Blood Glucose Calibration (ACCU-CHEK AVIVA) SOLN     . Blood Glucose Monitoring Suppl (ACCU-CHEK AVIVA PLUS) w/Device KIT     . carvedilol (COREG) 12.5 MG tablet Take 12.5 mg by mouth 2 (two) times daily with a meal.    . cyanocobalamin 1000 MCG tablet Take 1,000 mcg by mouth daily.    . DULoxetine (CYMBALTA) 30 MG capsule Take 30 mg by mouth daily.    Marland Kitchen esomeprazole (NEXIUM) 40 MG capsule Take 40 mg by mouth daily at 12 noon.    . famotidine (PEPCID) 40 MG tablet Take 40 mg by mouth daily.    . fluticasone furoate-vilanterol (BREO ELLIPTA) 200-25 MCG/INH AEPB Inhale 1 puff into the lungs daily.    . furosemide (LASIX) 20 MG tablet Take 20-40 mg by mouth daily as needed for fluid.    Marland Kitchen gabapentin (NEURONTIN) 300 MG capsule Take 1 capsule (300 mg total) by mouth 3 (three) times daily. (Patient not taking:  Reported on 10/27/2016) 90 capsule 0  . insulin glargine (LANTUS) 100 UNIT/ML injection 30 @ bedtime, 25 in the morning 10 mL 11  . Insulin Lispro (HUMALOG KWIKPEN) 200 UNIT/ML SOPN Inject 15-20 Units into the skin 3 (three) times daily. 15 units in the morning and 20 units at supper. Take as needed in addition    . ipratropium-albuterol (DUONEB) 0.5-2.5 (3) MG/3ML SOLN Take 3 mLs by nebulization daily as needed (for shortness of breath).     Marland Kitchen LANTUS SOLOSTAR 100 UNIT/ML Solostar Pen     . levothyroxine (SYNTHROID, LEVOTHROID) 75 MCG tablet     . Liraglutide (VICTOZA) 18 MG/3ML SOPN Inject 1.8 mg into the skin daily.    Marland Kitchen lisinopril (PRINIVIL,ZESTRIL) 10 MG tablet Take 1 tablet (10 mg total) by mouth daily. (Patient taking differently: Take 20 mg by mouth daily. ) 30 tablet 11  . metoCLOPramide (REGLAN) 5 MG tablet Take 5 mg by mouth 4 (four) times daily.    . ONE TOUCH ULTRA TEST test strip     . oxyCODONE (OXY IR/ROXICODONE) 5 MG immediate release tablet     . OXYGEN Inhale 2.5 L into the lungs at bedtime.    . potassium chloride (K-DUR,KLOR-CON) 10 MEQ tablet     . rOPINIRole (REQUIP) 1 MG tablet Take 1 mg by mouth at bedtime.    . solifenacin (VESICARE) 5 MG tablet Take 5 mg by mouth daily.    . traMADol (ULTRAM) 50 MG tablet Take 1 tablet (50 mg total) by mouth every 6 (six) hours as needed for moderate pain or severe pain. 75 tablet 0  . Vitamin D, Ergocalciferol, (DRISDOL) 50000 units CAPS capsule Take 50,000 Units by mouth every 7 (seven) days.     No current facility-administered medications for this visit.      Past Surgical History:  Procedure Laterality Date  . ABDOMINAL HYSTERECTOMY    . APPENDECTOMY    . BACK SURGERY    . BILATERAL KNEE ARTHROSCOPY  2000, 2009  . CARDIAC CATHETERIZATION     2009  . CATARACT EXTRACTION W/ INTRAOCULAR LENS  IMPLANT, BILATERAL    . CHOLECYSTECTOMY  1994  . COLONOSCOPY N/A 08/01/2013   Procedure: COLONOSCOPY;  Surgeon: Rogene Houston, MD;   Location[facility];  Service: Endoscopy;  Laterality: N/A;  930  . CYST EXCISION     Left breast  . EYE SURGERY  1967  . heel (  other)  2005  . HEMORRHOID SURGERY    . HERNIA REPAIR    . LUMBAR LAMINECTOMY/DECOMPRESSION MICRODISCECTOMY Right 08/23/2014   Procedure: LUMBAR LAMINECTOMY/DECOMPRESSION MICRODISCECTOMY 1 LEVEL;  Surgeon: Charlie Pitter, MD;  Location: [place] [facility];  Service: Neurosurgery;  Laterality: Right;  LUMBAR LAMINECTOMY/DECOMPRESSION MICRODISCECTOMY 1 LEVEL LUMBAR 2-3  . ovaries removed  2003  . PARTIAL HYSTERECTOMY  1981  . spinahatomy  1992  . TUBAL LIGATION  1975     Allergies  Allergen Reactions  . Hydrocodone Other (See Comments)    sedation  . Naproxen Anaphylaxis    REACTION: throat swelling  . Other Shortness Of Breath and Other (See Comments)    Local anesthesia Had asthma attack  . Oxycodone Other (See Comments)    sedation  . Sulfonamide Derivatives Other (See Comments)    Yeast infection  . Cinnamon   . Feldene [Piroxicam] Nausea Only  . Lyrica [Pregabalin] Other (See Comments)  . Phenergan [Promethazine Hcl] Other (See Comments)    triggers asthma attacks  . Ultram [Tramadol Hcl] Other (See Comments)  . Latex Rash  . Tape Rash    Do not use adhesive tape; okay to use paper tape.      Family History  Problem Relation Age of Onset  . Cancer Other   . Heart failure Other   . Colon cancer Brother      Social History Ms. Stuber reports that she has never smoked. She has never used smokeless tobacco. Ms. Hankinson reports that she does not drink alcohol.   Review of Systems CONSTITUTIONAL: No weight loss, fever, chills, weakness or fatigue.  HEENT: Eyes: No visual loss, blurred vision, double vision or yellow sclerae.No hearing loss, sneezing, congestion, runny nose or sore throat.  SKIN: No rash or itching.  CARDIOVASCULAR: per hpi RESPIRATORY: No shortness of breath, cough or sputum.  GASTROINTESTINAL: No anorexia, nausea,  vomiting or diarrhea. No abdominal pain or blood.  GENITOURINARY: No burning on urination, no polyuria NEUROLOGICAL: No headache, dizziness, syncope, paralysis, ataxia, numbness or tingling in the extremities. No change in bowel or bladder control.  MUSCULOSKELETAL: No muscle, back pain, joint pain or stiffness.  LYMPHATICS: No enlarged nodes. No history of splenectomy.  PSYCHIATRIC: No history of depression or anxiety.  ENDOCRINOLOGIC: No reports of sweating, cold or heat intolerance. No polyuria or polydipsia.  Marland Kitchen   Physical Examination Vitals:   02/23/17 0838  BP: 126/83  Pulse: 67   Vitals:   02/23/17 0838  Weight: 235 lb (106.6 kg)  Height: 5' (1.524 m)    Gen: resting comfortably, no acute distress HEENT: no scleral icterus, pupils equal round and reactive, no palptable cervical adenopathy,  CV: RRR, no m/r/g, no jvd Resp: Clear to auscultation bilaterally GI: abdomen is soft, non-tender, non-distended, normal bowel sounds, no hepatosplenomegaly MSK: extremities are warm, no edema.  Skin: warm, no rash Neuro:  no focal deficits Psych: appropriate affect   Diagnostic Studies 04/2007 cath HEMODYNAMIC RESULTS:  Aorta 141/77 mmHg.  Left ventricle 141/16 mmHg.   ANGIOGRAPHIC FINDINGS:  1. Left main coronary artery is medium in caliber and gives rise to      left anterior descending, ramus intermedius and circumflex vessels.      There is no significant flow-limiting coronary atherosclerosis      noted.  2. The left anterior descending is a medium caliber vessel that tapers      towards the apex.  This vessel gives rise to a fairly large  proximal diagonal branch and a smaller distal diagonal branch.      Flow was TIMI III throughout the vessel and is no significant flow-      limiting coronary atherosclerosis noted.  3. There is a small to medium caliber ramus intermedius without      significant flow-limiting coronary atherosclerosis.  4. The circumflex coronary  artery is a medium to large caliber vessel      with four obtuse marginal branches, the third of which is the      largest and bifurcates.  There are minor luminal irregularities      without flow-limiting coronary atherosclerosis.  5. The right coronary artery is a medium caliber dominant vessel.      There are minor luminal irregularities without significant flow-      limiting coronary sclerosis.  6. Left ventriculography was performed in the RAO projection and      revealed ejection fraction of approximately 55% without significant      wall motion abnormality or significant mitral regurgitation.   DIAGNOSES:  1. No significant flow-limiting coronary atherosclerosis noted within      major epicardial vessels.  There is some distal tapering of the      left anterior descending, although flow is TIMI III.  2. Left ventricular ejection fraction approximately 55% without      significant wall motion abnormality.  No significant mitral      regurgitation.  The left ventricle end-diastolic pressure of 16      mmHg.   DISCUSSION:  I reviewed results with the patient and with Dr. Dannielle Burn by  phone.  At this point will plan overnight observation.  Will follow up a  full set of cardiac markers as well as her electrocardiogram.  Anticipate a contrasted CT scan of the chest tomorrow to exclude  pulmonary embolus, dissection and mass.  Continue proton pump inhibitor.  If her CT scan of the chest is reassuring, may be able to continue  medical therapy with plans for risk factor modification.  One wonders  about the possibility of microvascular angina.  Further plans to follow.     Assessment and Plan   1. Chest pain - long history of chest pain with negative ischemic evaluations.  - atypical symptoms. Improving with NSAIDs - contiue ibuprofen 420m tid x 4 days. - she is to notify [place] symptoms change or progress     JArnoldo Lenis M.D.

## 2017-02-23 NOTE — Patient Instructions (Signed)
Your physician wants you to follow-up in: Kimberly [name] will receive a reminder letter in the mail two months in advance. If you don't receive a letter, please call our office to schedule the follow-up appointment.  Your physician recommends that you continue on your current medications as directed. Please refer to the Current Medication list given to you today.  Thank you for choosing [facility]!!

## 2017-02-25 ENCOUNTER — Ambulatory Visit: Payer: Medicare Other | Admitting: Cardiology

## 2017-04-06 ENCOUNTER — Encounter (HOSPITAL_COMMUNITY): Payer: Self-pay | Admitting: *Deleted

## 2017-04-06 ENCOUNTER — Inpatient Hospital Stay (HOSPITAL_COMMUNITY)
Admission: AD | Admit: 2017-04-06 | Discharge: 2017-04-09 | DRG: 439 | Disposition: A | Discharge status: Home / Self Care | Payer: Medicare Other | Source: Other Acute Inpatient Hospital | Attending: Internal Medicine | Admitting: Internal Medicine

## 2017-04-06 DIAGNOSIS — Z713 Dietary counseling and surveillance: Secondary | ICD-10-CM

## 2017-04-06 DIAGNOSIS — T50995A Adverse effect of other drugs, medicaments and biological substances, initial encounter: Secondary | ICD-10-CM | POA: Diagnosis present

## 2017-04-06 DIAGNOSIS — K7581 Nonalcoholic steatohepatitis (NASH): Secondary | ICD-10-CM | POA: Diagnosis present

## 2017-04-06 DIAGNOSIS — Z9049 Acquired absence of other specified parts of digestive tract: Secondary | ICD-10-CM | POA: Diagnosis not present

## 2017-04-06 DIAGNOSIS — E1143 Type 2 diabetes mellitus with diabetic autonomic (poly)neuropathy: Secondary | ICD-10-CM | POA: Diagnosis present

## 2017-04-06 DIAGNOSIS — E118 Type 2 diabetes mellitus with unspecified complications: Secondary | ICD-10-CM

## 2017-04-06 DIAGNOSIS — K859 Acute pancreatitis without necrosis or infection, unspecified: Principal | ICD-10-CM | POA: Diagnosis present

## 2017-04-06 DIAGNOSIS — J45909 Unspecified asthma, uncomplicated: Secondary | ICD-10-CM | POA: Diagnosis not present

## 2017-04-06 DIAGNOSIS — K7469 Other cirrhosis of liver: Secondary | ICD-10-CM | POA: Diagnosis present

## 2017-04-06 DIAGNOSIS — Z794 Long term (current) use of insulin: Secondary | ICD-10-CM | POA: Diagnosis not present

## 2017-04-06 DIAGNOSIS — E1159 Type 2 diabetes mellitus with other circulatory complications: Secondary | ICD-10-CM

## 2017-04-06 DIAGNOSIS — G4733 Obstructive sleep apnea (adult) (pediatric): Secondary | ICD-10-CM | POA: Diagnosis present

## 2017-04-06 DIAGNOSIS — K8500 Idiopathic acute pancreatitis without necrosis or infection: Secondary | ICD-10-CM | POA: Diagnosis not present

## 2017-04-06 DIAGNOSIS — E11649 Type 2 diabetes mellitus with hypoglycemia without coma: Secondary | ICD-10-CM | POA: Diagnosis present

## 2017-04-06 DIAGNOSIS — E669 Obesity, unspecified: Secondary | ICD-10-CM | POA: Diagnosis not present

## 2017-04-06 DIAGNOSIS — J449 Chronic obstructive pulmonary disease, unspecified: Secondary | ICD-10-CM | POA: Diagnosis present

## 2017-04-06 DIAGNOSIS — I10 Essential (primary) hypertension: Secondary | ICD-10-CM

## 2017-04-06 DIAGNOSIS — K3184 Gastroparesis: Secondary | ICD-10-CM | POA: Diagnosis present

## 2017-04-06 DIAGNOSIS — D61818 Other pancytopenia: Secondary | ICD-10-CM | POA: Diagnosis present

## 2017-04-06 DIAGNOSIS — E1141 Type 2 diabetes mellitus with diabetic mononeuropathy: Secondary | ICD-10-CM | POA: Diagnosis present

## 2017-04-06 DIAGNOSIS — K7460 Unspecified cirrhosis of liver: Secondary | ICD-10-CM

## 2017-04-06 HISTORY — DX: Mononeuropathy, unspecified: G58.9

## 2017-04-06 HISTORY — DX: Sleep apnea, unspecified: G47.30

## 2017-04-06 HISTORY — DX: Type 2 diabetes mellitus without complications: E11.9

## 2017-04-06 HISTORY — DX: Personal history of other diseases of the digestive system: Z87.19

## 2017-04-06 HISTORY — DX: Essential (primary) hypertension: I10

## 2017-04-06 HISTORY — DX: Chronic obstructive pulmonary disease, unspecified: J44.9

## 2017-04-06 HISTORY — DX: Fibromyalgia: M79.7

## 2017-04-06 HISTORY — DX: Unspecified chronic bronchitis: J42

## 2017-04-06 HISTORY — DX: Pulmonary hypertension, unspecified: I27.20

## 2017-04-06 HISTORY — DX: Unspecified osteoarthritis, unspecified site: M19.90

## 2017-04-06 HISTORY — DX: Injury of unspecified nerves of neck, initial encounter: S14.9XXA

## 2017-04-06 LAB — BASIC METABOLIC PANEL
ANION GAP: 7 (ref 5–15)
BUN: 6 mg/dL (ref 6–20)
CO2: 21 mmol/L — ABNORMAL LOW (ref 22–32)
CREATININE: 0.76 mg/dL (ref 0.44–1.00)
Calcium: 7.8 mg/dL — ABNORMAL LOW (ref 8.9–10.3)
Chloride: 114 mmol/L — ABNORMAL HIGH (ref 101–111)
GFR calc non Af Amer: >60 mL/min (ref >60)
Glucose, Bld: 81 mg/dL (ref 65–99)
POTASSIUM: 3.1 mmol/L — AB (ref 3.5–5.1)
SODIUM: 142 mmol/L (ref 135–145)

## 2017-04-06 LAB — CBC
HEMATOCRIT: 31.6 % — AB (ref 36.0–46.0)
HEMOGLOBIN: 11 g/dL — AB (ref 12.0–15.0)
MCH: 31.4 pg (ref 26.0–34.0)
MCHC: 34.8 g/dL (ref 30.0–36.0)
MCV: 90.3 fL (ref 78.0–100.0)
Platelets: 110 10*3/uL — ABNORMAL LOW (ref 150–400)
RBC: 3.5 MIL/uL — AB (ref 3.87–5.11)
RDW: 14.8 % (ref 11.5–15.5)
WBC: 3.7 10*3/uL — AB (ref 4.0–10.5)

## 2017-04-06 LAB — GLUCOSE, CAPILLARY
GLUCOSE-CAPILLARY: 71 mg/dL (ref 65–99)
Glucose-Capillary: 80 mg/dL (ref 65–99)

## 2017-04-06 LAB — HEPATIC FUNCTION PANEL
ALBUMIN: 3 g/dL — AB (ref 3.5–5.0)
ALT: 27 U/L (ref 14–54)
AST: 59 U/L — AB (ref 15–41)
Alkaline Phosphatase: 110 U/L (ref 38–126)
Bilirubin, Direct: 0.4 mg/dL (ref 0.1–0.5)
Indirect Bilirubin: 1.6 mg/dL — ABNORMAL HIGH (ref 0.3–0.9)
TOTAL PROTEIN: 5.8 g/dL — AB (ref 6.5–8.1)
Total Bilirubin: 2 mg/dL — ABNORMAL HIGH (ref 0.3–1.2)

## 2017-04-06 LAB — PROTIME-INR
INR: 1.17
PROTHROMBIN TIME: 14.9 s (ref 11.4–15.2)

## 2017-04-06 LAB — LIPASE, BLOOD: LIPASE: 114 U/L — AB (ref 11–51)

## 2017-04-06 MED ORDER — LEVOTHYROXINE SODIUM 100 MCG IV SOLR
37.5000 ug | Freq: Every day | INTRAVENOUS | Status: DC
Start: 2017-04-06 — End: 2017-04-06
  Filled 2017-04-06: qty 5

## 2017-04-06 MED ORDER — ACETAMINOPHEN 325 MG PO TABS
650.0000 mg | ORAL_TABLET | Freq: Four times a day (QID) | ORAL | Status: DC | PRN
  Administered 2017-04-07 – 2017-04-09 (×4): 650 mg via ORAL
  Filled 2017-04-06 (×4): qty 2

## 2017-04-06 MED ORDER — FLUTICASONE FUROATE-VILANTEROL 100-25 MCG/INH IN AEPB
1.0000 | INHALATION_SPRAY | Freq: Every day | RESPIRATORY_TRACT | Status: DC
  Administered 2017-04-07 – 2017-04-09 (×3): 1 via RESPIRATORY_TRACT
  Filled 2017-04-06: qty 28

## 2017-04-06 MED ORDER — METOCLOPRAMIDE HCL 5 MG/ML IJ SOLN: 10.0000 mg | Freq: Four times a day (QID) | INTRAMUSCULAR | Status: DC | PRN

## 2017-04-06 MED ORDER — PANTOPRAZOLE SODIUM 40 MG PO TBEC
40.0000 mg | DELAYED_RELEASE_TABLET | Freq: Every day | ORAL | Status: DC
  Administered 2017-04-06 – 2017-04-09 (×4): 40 mg via ORAL
  Filled 2017-04-06 (×4): qty 1

## 2017-04-06 MED ORDER — ONDANSETRON HCL 4 MG/2ML IJ SOLN
4.0000 mg | Freq: Four times a day (QID) | INTRAMUSCULAR | Status: DC | PRN
  Administered 2017-04-07: 4 mg via INTRAVENOUS
  Filled 2017-04-06: qty 2

## 2017-04-06 MED ORDER — ACETAMINOPHEN 650 MG RE SUPP: 650.0000 mg | Freq: Four times a day (QID) | RECTAL | Status: DC | PRN

## 2017-04-06 MED ORDER — CARVEDILOL 12.5 MG PO TABS
12.5000 mg | ORAL_TABLET | Freq: Two times a day (BID) | ORAL | Status: DC
  Administered 2017-04-06 – 2017-04-09 (×6): 12.5 mg via ORAL
  Filled 2017-04-06 (×6): qty 1

## 2017-04-06 MED ORDER — MORPHINE SULFATE (PF) 4 MG/ML IV SOLN
2.0000 mg | INTRAVENOUS | Status: DC | PRN
  Administered 2017-04-06 – 2017-04-07 (×2): 2 mg via INTRAVENOUS
  Filled 2017-04-06 (×2): qty 1

## 2017-04-06 MED ORDER — FLUTICASONE FUROATE-VILANTEROL 100-25 MCG/INH IN AEPB
1.0000 | INHALATION_SPRAY | Freq: Every day | RESPIRATORY_TRACT | Status: DC
  Filled 2017-04-06: qty 28

## 2017-04-06 MED ORDER — ONDANSETRON HCL 4 MG PO TABS: 4.0000 mg | ORAL_TABLET | Freq: Four times a day (QID) | ORAL | Status: DC | PRN

## 2017-04-06 MED ORDER — ENOXAPARIN SODIUM 40 MG/0.4ML ~~LOC~~ SOLN
40.0000 mg | SUBCUTANEOUS | Status: DC
Start: 2017-04-06 — End: 2017-04-09
  Administered 2017-04-06 – 2017-04-08 (×3): 40 mg via SUBCUTANEOUS
  Filled 2017-04-06 (×3): qty 0.4

## 2017-04-06 MED ORDER — POLYETHYLENE GLYCOL 3350 17 G PO PACK: 17.0000 g | PACK | Freq: Every day | ORAL | Status: DC | PRN

## 2017-04-06 MED ORDER — SODIUM CHLORIDE 0.9 % IV SOLN
INTRAVENOUS | Status: DC
  Administered 2017-04-06 – 2017-04-08 (×3): via INTRAVENOUS

## 2017-04-06 MED ORDER — INSULIN ASPART 100 UNIT/ML ~~LOC~~ SOLN: 0.0000 [IU] | Freq: Three times a day (TID) | SUBCUTANEOUS | Status: DC

## 2017-04-06 MED ORDER — ALBUTEROL SULFATE (2.5 MG/3ML) 0.083% IN NEBU
2.5000 mg | INHALATION_SOLUTION | RESPIRATORY_TRACT | Status: DC | PRN
  Administered 2017-04-07: 2.5 mg via RESPIRATORY_TRACT
  Filled 2017-04-06: qty 3

## 2017-04-06 MED ORDER — HYDRALAZINE HCL 20 MG/ML IJ SOLN: 10.0000 mg | Freq: Four times a day (QID) | INTRAMUSCULAR | Status: DC | PRN

## 2017-04-06 MED ORDER — LEVOTHYROXINE SODIUM 100 MCG IV SOLR
37.5000 ug | Freq: Every day | INTRAVENOUS | Status: DC
  Administered 2017-04-06 – 2017-04-07 (×2): 37.5 ug via INTRAVENOUS
  Filled 2017-04-06 (×3): qty 5

## 2017-04-06 NOTE — Consult Note (Addendum)
Referring Provider: Dr. Sloan Leiter Primary Care Physician:  Dr. Woody Seller Primary Gastroenterologist:  Dr. Laural Golden  Reason for Consultation:  Pancreatitis  HPI: Kimberly [name] is a 61 y.o. female with medical history significant of one prior episode of pancreatitis in the past, type 2 diabetes on Lantus/NovoLog and Victoza, OSA, hypertension, obesity, fibromyalgia, asthma who was admitted to [facility] over the past weekend for evaluation of abdominal pain. Per patient, she was having pain in her epigastric/right upper quadrant area radiating to the back for a few days prior to her presenting to the hospital. Pain was described as colicky in nature, 3/97 at its worst, radiating to the back. There was no particular aggravating or relieving factor. Pain was associated with numerous episodes of nausea and vomiting. Upon initial evaluation in [facility] patient was found to have pancreatitis, subsequently admitted and provided with supportive measures. She initially improved, however, she developed worsening pain, nausea and had another rise in her lipase levels. She was then kept nothing by mouth and supportive care was provided, however, she continued to have epigastric pain and nausea. She was then subsequently transferred to [facility] on 5/16 for further management and GI evaluation.  She tells me that she had her gallbladder removed in the 90's for gallstones.  Her other episode of pancreatitis was in 2009 or 2010 and was thought to be from the metformin that she was taking at that time.    Per discharge summary from [facility] her ultrasound showed a CBD at 5 mm.  CT scan showed stranding in the pancreatic area suggestive of pancreatitis and also showed early changes of cirrhosis. Her LFTs are mildly elevated with an AST 59 total bili 2.0. ALT and alkaline phosphatase are normal. She says that this is the first that she is hearing about the cirrhosis. She says that she is aware that  she's had fatty liver for a while. She is followed with Dr. Laural Golden in [place] for colonoscopies in the past. She says her last colonoscopy was in 2013 are 2014. She says that she actually had a Hemoccult-positive stool a few weeks ago as an outpatient and was supposed to be setting up for another colonoscopy in the near future for that reason. She denies alcohol use.  She tells me that she still does not feel great this morning.  She thinks that her pain has decreased to a 6 out of 10 instead of an 8 or 9 out of 10, but she still just feels bad. Her lipase level yesterday was down to 114. It was not rechecked again today.  Platelets are slightly low at 102. Has a normocytic anemia with hemoglobin of 10.8 g this morning. Potassium low at 3.1. INR 1.17.  Of note, the patient had an EUS by Dr. Amalia Hailey at [facility] in 02/2010 following her episode of pancreatits.  Results as follows:  FINDINGS The pancreatic parenchyma was diffusely  hypoechoic consistent with fatty infiltration.The  bile duct was mildly dilated measuring 40m with  no stones or strictures within it.It was seen to taper  normally to the ampulla.The pancreatic duct was  seen to enter the ampulla.The ampulla was  endoscopically normal ENDOSCOPIC DIAGNOSIS Relatively normal post choley EUS No etiology of pancreatitis discernible   Past Medical History:  Diagnosis Date  . Asthma   . Chronic bronchitis ([place]   . Complication of anesthesia    asthma attack with one surgery  . COPD (chronic obstructive pulmonary disease) ([place]   . Depression   .  Diabetes mellitus without complication ([place])   . DJD (degenerative joint disease)   . Fibromyalgia   . GERD (gastroesophageal reflux disease)   . History of hiatal hernia   . Hypertension   . Hypothyroidism   . Pinched nerve in neck   . Pulmonary hypertension ([place])   . Seizures ([place])    in childhood  . Sleep apnea   . Spinal stenosis     Past Surgical History:   Procedure Laterality Date  . ABDOMINAL HYSTERECTOMY     partial hysterectomy  . APPENDECTOMY    . BACK SURGERY     disc surgery with complications and damage to right side  . CHOLECYSTECTOMY    . EXCISIONAL HEMORRHOIDECTOMY    . EYE SURGERY     bilateral cataract removal with lens implants  . EYE SURGERY     growth removed from right eye lid  . EYE SURGERY     bilateral eye surgery age 32 t"to straighten eyes'  . HEEL SPUR SURGERY     Right heel - growth removal and rebuilding of heel  . HERNIA REPAIR    . KNEE ARTHROSCOPY Bilateral   . SPHINCTEROTOMY    . TUBAL LIGATION      Prior to Admission medications   Medication Sig Start Date End Date Taking? Authorizing Provider  amLODipine (NORVASC) 2.5 MG tablet Take 2.5 mg by mouth every morning.   Yes [provider]  carvedilol (COREG) 12.5 MG tablet Take 12.5 mg by mouth every morning.   Yes [provider]  DULoxetine (CYMBALTA) 30 MG capsule Take 30 mg by mouth every evening.   Yes [provider]  famotidine (PEPCID) 40 MG tablet Take 40 mg by mouth every morning.   Yes [provider]  fluticasone furoate-vilanterol (BREO ELLIPTA) 200-25 MCG/INH AEPB Inhale 2 puffs into the lungs every morning.   Yes [provider]  furosemide (LASIX) 20 MG tablet Take 20 mg by mouth 2 (two) times daily.   Yes [provider]  insulin glargine (LANTUS) 100 UNIT/ML injection Inject 14-42 Units into the skin See admin instructions. Inject 14 units every morning and 42 units every night at bedtime.   Yes [provider]  Insulin Lispro (HUMALOG KWIKPEN) 200 UNIT/ML SOPN Inject 5 Units into the skin 3 (three) times daily before meals.   Yes [provider]  levothyroxine (SYNTHROID, LEVOTHROID) 75 MCG tablet Take 75 mcg by mouth daily before breakfast.   Yes [provider]  liraglutide (VICTOZA) 18 MG/3ML SOPN Inject 1.8 mg into the skin every morning.   Yes [provider]  lisinopril (PRINIVIL,ZESTRIL) 20 MG tablet Take 20 mg by mouth every morning.   Yes [provider]  metoCLOPramide (REGLAN) 5 MG tablet Take 5 mg by mouth 3 (three) times daily before meals.   Yes [provider]  potassium citrate (UROCIT-K) 5 MEQ (540 MG) SR tablet Take 5 mEq by mouth 2 (two) times daily.   Yes [provider]  traMADol (ULTRAM) 50 MG tablet Take by mouth every 6 (six) hours as needed (pain).   Yes [provider]    Current Facility-Administered Medications  Medication Dose Route Frequency Provider Last Rate Last Dose  . 0.9 %  sodium chloride infusion   Intravenous Continuous Jonetta Osgood, MD 100 mL/hr at 04/07/17 0432    . acetaminophen (TYLENOL) tablet 650 mg  650 mg Oral Q6H PRN Ghimire, Henreitta Leber, MD  Or  . acetaminophen (TYLENOL) suppository 650 mg  650 mg Rectal Q6H PRN Ghimire, Henreitta Leber, MD      . albuterol (PROVENTIL) (2.5 MG/3ML) 0.083% nebulizer solution 2.5 mg  2.5 mg Nebulization Q2H PRN Ghimire, Henreitta Leber, MD      . carvedilol (COREG) tablet 12.5 mg  12.5 mg Oral BID WC Jonetta Osgood, MD   12.5 mg at 04/07/17 0824  . dextrose 10 % infusion   Intravenous Continuous Rizwan, Saima, MD      . diclofenac sodium (VOLTAREN) 1 % transdermal gel 4 g  4 g Topical QID Rizwan, Saima, MD      . enoxaparin (LOVENOX) injection 40 mg  40 mg Subcutaneous Q24H Jonetta Osgood, MD   40 mg at 04/06/17 2104  . fluticasone furoate-vilanterol (BREO ELLIPTA) 100-25 MCG/INH 1 puff  1 puff Inhalation Daily Jonetta Osgood, MD   1 puff at 04/07/17 [phone]  . hydrALAZINE (APRESOLINE) injection 10 mg  10 mg Intravenous Q6H PRN Ghimire, Henreitta Leber, MD      . levothyroxine (SYNTHROID, LEVOTHROID) injection 37.5 mcg  37.5 mcg Intravenous Daily Jonetta Osgood, MD   37.5 mcg at 04/06/17 1759  . magnesium sulfate IVPB 2 g 50 mL  2 g Intravenous Once Debbe Odea, MD      . metoCLOPramide (REGLAN) injection 10 mg  10 mg  Intravenous Q6H PRN Ghimire, Henreitta Leber, MD      . morphine 4 MG/ML injection 2 mg  2 mg Intravenous Q4H PRN Jonetta Osgood, MD   2 mg at 04/07/17 0101  . ondansetron (ZOFRAN) tablet 4 mg  4 mg Oral Q6H PRN Jonetta Osgood, MD       Or  . ondansetron [facility]) injection 4 mg  4 mg Intravenous Q6H PRN Jonetta Osgood, MD   4 mg at 04/07/17 0101  . pantoprazole (PROTONIX) EC tablet 40 mg  40 mg Oral Daily Jonetta Osgood, MD   40 mg at 04/06/17 1654  . polyethylene glycol (MIRALAX / GLYCOLAX) packet 17 g  17 g Oral Daily PRN Ghimire, Henreitta Leber, MD      . potassium chloride SA (K-DUR,KLOR-CON) CR tablet 40 mEq  40 mEq Oral Q4H Debbe Odea, MD   40 mEq at 04/07/17 0824    Allergies as of 04/06/2017 - Review Complete 04/06/2017  Allergen Reaction Noted  . Other Shortness Of Breath 04/06/2017  . Sulfa antibiotics  04/06/2017  . Latex Rash 04/06/2017    History reviewed. No pertinent family history.  Social History   Social History  . Marital status: Married    Spouse name: N/A  . Number of children: N/A  . Years of education: N/A   Occupational History  . Not on file.   Social History Main Topics  . Smoking status: Never Smoker  . Smokeless tobacco: Never Used  . Alcohol use No  . Drug use: No  . Sexual activity: Not on file   Other Topics Concern  . Not on file   Social History Narrative  . No narrative on file    Review of Systems: ROS is O/W negative except as mentioned in HPI.  Physical Exam: Vital signs in last 24 hours: Temp:  [97.5 F (36.4 C)-98.5 F (36.9 C)] 97.5 F (36.4 C) (05/17 0433) Pulse Rate:  [53-65] 53 (05/17 0433) Resp:  [16-20] 20 (05/17 0433) BP: (117-140)/(60-66) 117/63 (05/17 0433) SpO2:  [95 %-100 %] 95 % (05/17 0609) Last BM Date:  04/06/17 General:  Alert, Well-developed, well-nourished, pleasant and cooperative in NAD Head:  Normocephalic and atraumatic. Eyes:  Sclera clear, no icterus.  Conjunctiva pink. Ears:  Normal  auditory acuity. Mouth:  No deformity or lesions.   Lungs:  Clear throughout to auscultation.  No wheezes, crackles, or rhonchi.  No increased WOB. Heart:  Regular rate and rhythm; no murmurs, clicks, rubs,  or gallops. Abdomen:  Soft, non-distended.  BS present.  Moderate TTP in upper abdomen. Rectal:  Deferred  Msk:  Symmetrical without gross deformities. Pulses:  Normal pulses noted. Extremities:  Without clubbing or edema. Neurologic:  Alert and oriented x 4;  grossly normal neurologically. Skin:  Intact without significant lesions or rashes. Psych:  Alert and cooperative. Normal mood and affect.  Intake/Output from previous day: 05/16 0701 - 05/17 0700 In: 1200 [I.V.:1200] Out: -   Lab Results:  Recent Labs  04/06/17 1622 04/07/17 0603  WBC 3.7* 3.0*  HGB 11.0* 10.8*  HCT 31.6* 31.5*  PLT 110* 102*   BMET  Recent Labs  04/06/17 1622 04/07/17 0603  NA 142 139  K 3.1* 3.1*  CL 114* 111  CO2 21* 19*  GLUCOSE 81 65  BUN 6 6  CREATININE 0.76 0.77  CALCIUM 7.8* 7.7*   LFT  Recent Labs  04/06/17 1622  PROT 5.8*  ALBUMIN 3.0*  AST 59*  ALT 27  ALKPHOS 110  BILITOT 2.0*  BILIDIR 0.4  IBILI 1.6*   PT/INR  Recent Labs  04/06/17 1622  LABPROT 14.9  INR 1.17   IMPRESSION:  -Acute pancreatitis:  Likely medication related from Victoza as she had and episode 8 or 9 years ago from Metformin.  She is s/p cholecystectomy in the 90's for gallstones but labs and imaging do not suggest biliary source.  Does not drink ETOH.  Negative EUS at [facility] in 2011.  Lipase significantly improved today and she reports her pain is slightly less. -Cirrhosis:  ? NASH as patient reports that she has known about fatty liver for a while.  This is the first that she has heard about the cirrhosis.  Mildly elevated LFT's.  Should have other serologies performed, which can be done as an outpatient.   -Mild thrombocytopenia:  Secondary to liver disease. -Hypokalemia:  Replacement per  primary service.  PLAN: -Continue IVF's (currently at 100 cc's per hour), anti-emetics, pain control. -If not much improved by tomorrow then may want to repeat CT scan. -She can follow-up as an outpatient with Dr. Laural Golden in regards to the cirrhosis, recent hemoccult positive stools, etc. -? If we should at least try clear liquids today.  ZEHR, JESSICA D.  04/07/2017, 10:34 AM  Pager number [phone]  ________________________________________________________________________  Velora Heckler GI MD note:  I personally examined the patient, reviewed the data and agree with the assessment and plan described above.  Per OSH discharge summary her lipase max was 666, amylase max 270, slightly elevated INdirect bilirubin, CT scan showed "minimal stranding around the pancreatic tail" and also " changes suggestive of early cirrhosis."  I cannot view any of these images myself.  She appears to have mild acute pancreatitis, unclear etiology but may be from [place].  She's not a drinker, GB removed remotely and no suggestion of CBD pathology on outside imaging.  She has abd discomfort, overall better than at admit but not gone yet, nausea.  She is ambulating around the room, + flatus, +hungry.  Will advance diet as tolerated, continue IVfluids, change to scheduled antinausea meds.  No need to follow serial pancreatic enzymes. Agree if not improving by tomorrow late morning/early afternoon then will repeat CT.  She knows she needs to ambulate.   Owens Loffler, MD [facility] Pager [phone]

## 2017-04-06 NOTE — H&P (Signed)
HISTORY AND PHYSICAL       PATIENT DETAILS Name: Kimberly [name] Age: 61 y.o. Sex: female Date of Birth: Mar 29, 1956 Admit Date: 04/06/2017 MVE:[facility], Pcp Not In   Patient coming from: [facility] [place]:  Abdominal pain  HPI: Kimberly [name] is a 61 y.o. female with medical history significant of one prior episode of pancreatitis in the past, type 2 diabetes on Lantus/NovoLog and Victoza, OSA, hypertension, obesity who was admitted to [facility] over the past weekend for evaluation of abdominal pain. Per patient, she was having pain in her epigastric/right upper quadrant area radiating to the back for a few days prior to her presenting to the hospital. Pain was described as colicky in nature, 0/96 at its worst, radiating to the back. There was no particular aggravating or relieving factor. Pain was associated with numerous episodes of nausea and vomiting. Upon initial evaluation in [facility] patient was found to have pink otitis, subsequently admitted and provided with supportive measures. She initially improved, however he developed worsening pain, nausea and worsening off her lipase levels. She was then kept nothing by mouth and supportive care was provided, however she continued to have epigastric pain and nausea. She was then subsequently transferred to [facility] for further management and GI evaluation.  During my evaluation at [facility] was lying comfortably in bed and did not appear in any distress. She noted that she no longer was vomiting, and the nausea had markedly decreased compared to the past few days. Her main issue is ongoing intermittent abdominal pain. She last had a bowel movement earlier today.  There is no history of fever, chest pain or shortness of breath.  ED Course:  See above  Note: Lives at: Home Mobility:ndependent Chronic Indwelling Foley:no   REVIEW OF SYSTEMS:  Constitutional:   No   weight loss, night sweats,  Fevers, chills, fatigue.  HEENT:    No headaches, Dysphagia,Tooth/dental problems,Sore throat,  No sneezing, itching, ear ache, nasal congestion, post nasal drip  Cardio-vascular: No chest pain,Orthopnea, PND,lower extremity edema, anasarca, palpitations  GI:  No heartburn, indigestion,  diarrhea, melena or hematochezia  Resp: No shortness of breath, cough, hemoptysis,plueritic chest pain.   Skin:  No rash or lesions.  GU:  No dysuria, change in color of urine, no urgency or frequency.  No flank pain.  Musculoskeletal: No joint pain or swelling.  No decreased range of motion.  No back pain.  Endocrine: No heat intolerance, no cold intolerance, no polyuria, no polydipsia  Psych: No change in mood or affect. No depression or anxiety.  No memory loss.   ALLERGIES:  Naproxen  PAST MEDICAL HISTORY: DM 2 Hypertension OSA Morbid obesity Pancreatitis  PAST SURGICAL HISTORY: Cholecystectomy  MEDICATIONS AT HOME: Prior to Admission medications   Not on File    FAMILY HISTORY: No family history pancreatitis or GI malignancy  SOCIAL HISTORY: Denies alcohol use.  PHYSICAL EXAM: Blood pressure 122/60, pulse 65, temperature 98.1 F (36.7 C), temperature source Oral, resp. rate 20, SpO2 95 %.  General appearance :Awake, alert, not in any distress. Speech Clear. Not toxic Looking Eyes:, pupils equally reactive to light and accomodation,no scleral icterus.Pink conjunctiva HEENT: Atraumatic and Normocephalic Neck: supple, no JVD. No cervical lymphadenopathy. No thyromegaly Resp:Good air entry bilaterally, no added sounds  CVS: S1 S2 regular, no murmurs.  GI: Bowel sounds present, mildly tender in the epigastric area-abdomen soft without any peritoneal signs. She is  obese.  Extremities: B/L Lower Ext shows no edema, both legs are warm to touch Neurology:  speech clear,Non focal, sensation is grossly intact. Psychiatric: Normal judgment and  insight. Alert and oriented x 3. Normal mood. Musculoskeletal:gait appears to be normal.No digital cyanosis Skin:No Rash, warm and dry Wounds:N/A  LABS ON ADMISSION:  I have personally reviewed following labs and imaging studies  CBC: No results for input(s): WBC, NEUTROABS, HGB, HCT, MCV, PLT in the last 168 hours.  Basic Metabolic Panel: No results for input(s): NA, K, CL, CO2, GLUCOSE, BUN, CREATININE, CALCIUM, MG, PHOS in the last 168 hours.  GFR: CrCl cannot be calculated (No order found.).  Liver Function Tests: No results for input(s): AST, ALT, ALKPHOS, BILITOT, PROT, ALBUMIN in the last 168 hours. No results for input(s): LIPASE, AMYLASE in the last 168 hours. No results for input(s): AMMONIA in the last 168 hours.  Coagulation Profile: No results for input(s): INR, PROTIME in the last 168 hours.  Cardiac Enzymes: No results for input(s): CKTOTAL, CKMB, CKMBINDEX, TROPONINI in the last 168 hours.  BNP (last 3 results) No results for input(s): PROBNP in the last 8760 hours.  HbA1C: No results for input(s): HGBA1C in the last 72 hours.  CBG:  Recent Labs Lab 04/06/17 1503  GLUCAP 80    Lipid Profile: No results for input(s): CHOL, HDL, LDLCALC, TRIG, CHOLHDL, LDLDIRECT in the last 72 hours.  Thyroid Function Tests: No results for input(s): TSH, T4TOTAL, FREET4, T3FREE, THYROIDAB in the last 72 hours.  Anemia Panel: No results for input(s): VITAMINB12, FOLATE, FERRITIN, TIBC, IRON, RETICCTPCT in the last 72 hours.  Urine analysis: No results found for: COLORURINE, APPEARANCEUR, LABSPEC, PHURINE, GLUCOSEU, HGBUR, BILIRUBINUR, KETONESUR, PROTEINUR, UROBILINOGEN, NITRITE, LEUKOCYTESUR  Sepsis Labs: Lactic Acid, Venous No results found for: [place]   Microbiology: No results found for this or any previous visit (from the past 240 hour(s)).    RADIOLOGIC STUDIES ON ADMISSION: No results found.  I have personally reviewed images of CT abdomen  done at outside hospital  Note-I have reviewed labs, discharge summary-done at outside hospital  ASSESSMENT AND PLAN: Acute pancreatitis: Etiology likely secondary to Victoza-she is status post cholecystectomy, she denies any alcohol use. We will continue nothing by mouth status, and provide as needed narcotics. Await repeat labs, she will likely require a CT scan of the abdomen to be done if her labs do not show any obvious major abnormalities. Have spoken to gastroenterology on-call (Dr. Ardis Hughs) they will reevaluate tomorrow morning. If she continues to be nothing by mouth-she will likely need a NG tube placement and post pyloric feeding initiated.  Insulin-dependent DM 2: Per documentation provided from outside hospital-patient was hypoglycemic-for now I will just keep her on sliding scale insulin. Once her diet is more stable and depending on how her CBGs do over the next few hours-week and consider initiating long-acting insulin at lower doses.  ? Gastroparesis: Per patient-she takes Reglan as needed at home-not sure if she has underlying gastroparesis. For now would continue to use as needed Zofran-if a hospital course is complicated by persistent nausea and vomiting-she may need to be started on scheduled Reglan. She does have a history of long-standing diabetes.  History of neuropathy: Apparently has peripheral neuropathy-but per patient she no longer takes Neurontin, Cymbalta or  Requip.  Hypertension: Hold lisinopril-to resume Coreg-follow and adjust accordingly.  Bronchial asthma: Stable-lungs clear-continue as needed bronchodilators.  OSA: CPAP daily at bedtime  Morbid obesity: Counseled regarding importance of weight loss  Further plan will depend as patient's clinical course evolves and further radiologic and laboratory data become available. Patient will be monitored closely.  Above noted plan was discussed with patient face to face at bedside, she was in agreement.   CONSULTS: GI-will evaluate 5/17  DVT Prophylaxis: Prophylactic Lovenox   Code Status: Full Code  Disposition Plan:  Discharge back home in 3-4 days, depending on clinical course  Admission status: Inpatient going to medical floor  The medical decision making on this patient was of high complexity and the patient is at high risk for clinical deterioration, therefore this is a level 3 visit.  Total time spent  55 minutes.Greater than 50% of this time was spent in counseling, explanation of diagnosis, planning of further management, and coordination of care.  Oren Binet [facility] Pager [phone]  If 7PM-7AM, please contact night-coverage [email or web address] Password [facility] 04/06/2017, 3:37 PM

## 2017-04-07 DIAGNOSIS — E118 Type 2 diabetes mellitus with unspecified complications: Secondary | ICD-10-CM

## 2017-04-07 DIAGNOSIS — E669 Obesity, unspecified: Secondary | ICD-10-CM

## 2017-04-07 DIAGNOSIS — K859 Acute pancreatitis without necrosis or infection, unspecified: Principal | ICD-10-CM

## 2017-04-07 LAB — GLUCOSE, CAPILLARY
GLUCOSE-CAPILLARY: 66 mg/dL (ref 65–99)
GLUCOSE-CAPILLARY: 95 mg/dL (ref 65–99)
GLUCOSE-CAPILLARY: 150 mg/dL — AB (ref 65–99)
Glucose-Capillary: 115 mg/dL — ABNORMAL HIGH (ref 65–99)
Glucose-Capillary: 178 mg/dL — ABNORMAL HIGH (ref 65–99)

## 2017-04-07 LAB — MAGNESIUM
Magnesium: 1.5 mg/dL — ABNORMAL LOW (ref 1.7–2.4)
Magnesium: 1.5 mg/dL — ABNORMAL LOW (ref 1.7–2.4)

## 2017-04-07 LAB — LIPID PANEL
CHOL/HDL RATIO: 5.3 ratio
CHOLESTEROL: 138 mg/dL (ref 0–200)
HDL: 26 mg/dL — ABNORMAL LOW (ref >40)
LDL CALC: 84 mg/dL (ref 0–99)
Triglycerides: 139 mg/dL (ref <150)
VLDL: 28 mg/dL (ref 0–40)

## 2017-04-07 LAB — BASIC METABOLIC PANEL
Anion gap: 9 (ref 5–15)
BUN: 6 mg/dL (ref 6–20)
CO2: 19 mmol/L — ABNORMAL LOW (ref 22–32)
CREATININE: 0.77 mg/dL (ref 0.44–1.00)
Calcium: 7.7 mg/dL — ABNORMAL LOW (ref 8.9–10.3)
Chloride: 111 mmol/L (ref 101–111)
GFR calc Af Amer: >60 mL/min (ref >60)
GLUCOSE: 65 mg/dL (ref 65–99)
POTASSIUM: 3.1 mmol/L — AB (ref 3.5–5.1)
Sodium: 139 mmol/L (ref 135–145)

## 2017-04-07 LAB — CBC
HEMATOCRIT: 31.5 % — AB (ref 36.0–46.0)
HEMOGLOBIN: 10.8 g/dL — AB (ref 12.0–15.0)
MCH: 31.1 pg (ref 26.0–34.0)
MCHC: 34.3 g/dL (ref 30.0–36.0)
MCV: 90.8 fL (ref 78.0–100.0)
Platelets: 102 10*3/uL — ABNORMAL LOW (ref 150–400)
RBC: 3.47 MIL/uL — ABNORMAL LOW (ref 3.87–5.11)
RDW: 14.7 % (ref 11.5–15.5)
WBC: 3 10*3/uL — ABNORMAL LOW (ref 4.0–10.5)

## 2017-04-07 LAB — HIV ANTIBODY (ROUTINE TESTING W REFLEX): HIV SCREEN 4TH GENERATION: NONREACTIVE

## 2017-04-07 MED ORDER — MAGNESIUM SULFATE 2 GM/50ML IV SOLN
2.0000 g | Freq: Once | INTRAVENOUS | Status: AC
  Administered 2017-04-07: 2 g via INTRAVENOUS
  Filled 2017-04-07: qty 50

## 2017-04-07 MED ORDER — MORPHINE SULFATE (PF) 4 MG/ML IV SOLN
1.0000 mg | Freq: Once | INTRAVENOUS | Status: AC
  Administered 2017-04-07: 1 mg via INTRAVENOUS
  Filled 2017-04-07: qty 1

## 2017-04-07 MED ORDER — ONDANSETRON HCL 4 MG PO TABS
4.0000 mg | ORAL_TABLET | Freq: Two times a day (BID) | ORAL | Status: DC
  Administered 2017-04-07 – 2017-04-09 (×5): 4 mg via ORAL
  Filled 2017-04-07 (×5): qty 1

## 2017-04-07 MED ORDER — DEXTROSE 50 % IV SOLN
25.0000 mL | Freq: Once | INTRAVENOUS | Status: AC
  Administered 2017-04-07: 25 mL via INTRAVENOUS

## 2017-04-07 MED ORDER — DEXTROSE 10 % IV SOLN
INTRAVENOUS | Status: DC
  Administered 2017-04-07: 11:00:00 via INTRAVENOUS
  Filled 2017-04-07: qty 1000

## 2017-04-07 MED ORDER — DICLOFENAC SODIUM 1 % TD GEL
4.0000 g | Freq: Four times a day (QID) | TRANSDERMAL | Status: DC
  Administered 2017-04-07 – 2017-04-09 (×9): 4 g via TOPICAL
  Filled 2017-04-07: qty 100

## 2017-04-07 MED ORDER — POTASSIUM CHLORIDE CRYS ER 20 MEQ PO TBCR
40.0000 meq | EXTENDED_RELEASE_TABLET | ORAL | Status: AC
  Administered 2017-04-07 (×2): 40 meq via ORAL
  Filled 2017-04-07 (×2): qty 2

## 2017-04-07 MED ORDER — DEXTROSE 50 % IV SOLN
INTRAVENOUS | Status: AC
  Administered 2017-04-07: 25 mL via INTRAVENOUS
  Filled 2017-04-07: qty 50

## 2017-04-07 NOTE — Progress Notes (Signed)
Nutrition Brief Note  Patient identified on the Malnutrition Screening Tool (MST) Report  Patient reports weighing 235 lb at admission. This is a UBW for her.  Pt reports she was eating well prior to N/V symptoms. Will monitor for tolerance of diet. No need for nutritional supplements at this time.  Current diet order is CLD. Diet just now advanced. Labs and medications reviewed.   No nutrition interventions warranted at this time. If nutrition issues arise, please consult RD.   Tilda FrancoLindsey Erick Oxendine, MS, RD, LDN Pager: [phone] After Hours Pager: [phone]

## 2017-04-07 NOTE — Progress Notes (Addendum)
PROGRESS NOTE    Kimberly [name]   OZD:[id number]  DOB: May 02, 1956  DOA: 04/06/2017 PCP: System, Pcp Not In   Brief Narrative:  61 y/o female with h/o DM 2, HTN, asthma who presented to [facility] with abdominal pain and was found to have acute pancreatitis. As her symptoms have not improved and there was no GI subspecialty available there, she was transferred to [facility].   Subjective: Abdominal pain improving. No nausea- tolerating diet.   Assessment & Plan:   Principal Problem:   Acute pancreatitis - she states she had an episode of acute pancreatitis in 2009 and she was on Metformin at the time which was discontinued. She has had a cholecystectomy and had an EUS in 2011 showing no etiology for her pancreatitis. She does not drink alcohol. - Triglyceride level found to be normal - possibly due to Victoza?- this is on hold - appears to have an abdominal wall component as well and therefore have started Voltaren gel - continue to advance diet-   Active Problems:   DM (diabetes mellitus)  - sliding scale insulin  Cirrhosis - noted in imaging- ? Due to NASH    HTN (hypertension) - Coreg  Asthma in adult - uses Breo ellipta and albuterol rescue inhaler    OSA (obstructive sleep apnea) - CPAP  DVT prophylaxis: Lovenox Code Status: Full  Family Communication:  Disposition Plan: home when stable Consultants:   GI Procedures:    Antimicrobials:  Anti-infectives    None       Objective: Vitals:   04/06/17 2138 04/06/17 2317 04/07/17 0433 04/07/17 0609  BP:  140/66 117/63   Pulse: (!) 56 (!) 55 (!) 53   Resp: 16 16 20    Temp:  98.5 F (36.9 C) 97.5 F (36.4 C)   TempSrc:  Axillary Axillary   SpO2: 98% 100% 98% 95%    Intake/Output Summary (Last 24 hours) at 04/07/17 1359 Last data filed at 04/07/17 0800  Gross per 24 hour  Intake             1200 ml  Output                0 ml  Net             1200 ml   There were no vitals filed  for this visit.  Examination: General exam: Appears comfortable  HEENT: PERRLA, oral mucosa moist, no sclera icterus or thrush Respiratory system: Clear to auscultation. Respiratory effort normal. Cardiovascular system: S1 & S2 heard, RRR.  No murmurs  Gastrointestinal system: Abdomen soft,  Tender in RUQ, nondistended. Normal bowel sound. No organomegaly Central nervous system: Alert and oriented. No focal neurological deficits. Extremities: No cyanosis, clubbing or edema Skin: No rashes or ulcers Psychiatry:  Mood & affect appropriate.    Data Reviewed: I have personally reviewed following labs and imaging studies  CBC:  Recent Labs Lab 04/06/17 1622 04/07/17 0603  WBC 3.7* 3.0*  HGB 11.0* 10.8*  HCT 31.6* 31.5*  MCV 90.3 90.8  PLT 110* 259*   Basic Metabolic Panel:  Recent Labs Lab 04/06/17 1622 04/07/17 0603 04/07/17 1056  NA 142 139  --   K 3.1* 3.1*  --   CL 114* 111  --   CO2 21* 19*  --   GLUCOSE 81 65  --   BUN 6 6  --   CREATININE 0.76 0.77  --   CALCIUM 7.8* 7.7*  --   MG  --  1.5* 1.5*   GFR: CrCl cannot be calculated (Unknown ideal weight.). Liver Function Tests:  Recent Labs Lab 04/06/17 1622  AST 59*  ALT 27  ALKPHOS 110  BILITOT 2.0*  PROT 5.8*  ALBUMIN 3.0*    Recent Labs Lab 04/06/17 1622  LIPASE 114*   No results for input(s): AMMONIA in the last 168 hours. Coagulation Profile:  Recent Labs Lab 04/06/17 1622  INR 1.17   Cardiac Enzymes: No results for input(s): CKTOTAL, CKMB, CKMBINDEX, TROPONINI in the last 168 hours. BNP (last 3 results) No results for input(s): PROBNP in the last 8760 hours. HbA1C: No results for input(s): HGBA1C in the last 72 hours. CBG:  Recent Labs Lab 04/06/17 1503 04/06/17 2144 04/07/17 0731 04/07/17 0754 04/07/17 1215  GLUCAP 80 71 66 115* 95   Lipid Profile:  Recent Labs  04/07/17 0603  CHOL 138  HDL 26*  LDLCALC 84  TRIG 139  CHOLHDL 5.3   Thyroid Function Tests: No  results for input(s): TSH, T4TOTAL, FREET4, T3FREE, THYROIDAB in the last 72 hours. Anemia Panel: No results for input(s): VITAMINB12, FOLATE, FERRITIN, TIBC, IRON, RETICCTPCT in the last 72 hours. Urine analysis: No results found for: COLORURINE, APPEARANCEUR, LABSPEC, PHURINE, GLUCOSEU, HGBUR, BILIRUBINUR, KETONESUR, PROTEINUR, UROBILINOGEN, NITRITE, LEUKOCYTESUR Sepsis Labs: @LABRCNTIP (procalcitonin:4,lacticidven:4) )No results found for this or any previous visit (from the past 240 hour(s)).       Radiology Studies: No results found.    Scheduled Meds: . carvedilol  12.5 mg Oral BID WC  . diclofenac sodium  4 g Topical QID  . enoxaparin (LOVENOX) injection  40 mg Subcutaneous Q24H  . fluticasone furoate-vilanterol  1 puff Inhalation Daily  . levothyroxine  37.5 mcg Intravenous Daily  . pantoprazole  40 mg Oral Daily   Continuous Infusions: . sodium chloride 100 mL/hr at 04/07/17 0432  . dextrose 50 mL/hr at 04/07/17 1041     LOS: 1 day    Time spent in minutes: 35    Debbe Odea, MD [facility] Pager: [email or web address] Password [email or web address] 04/07/2017, 1:59 PM

## 2017-04-07 NOTE — Progress Notes (Signed)
Spoke with Brittney RN regarding PICC vs PIV need.  Pt currently on D10, but starting Cliquid diet.  RN to call MD for plans.

## 2017-04-07 NOTE — Progress Notes (Addendum)
Hypoglycemic Event  CBG: 66  Treatment: D50 IV 25 mL  Symptoms: Sweaty and Hungry  Follow-up CBG: Time 0754 CBG Result: 115  Possible Reasons for Event: Other: NPO  Comments/MD notified:[id number]    Keauna Brasel A Leretha DykesFraser

## 2017-04-08 DIAGNOSIS — K8500 Idiopathic acute pancreatitis without necrosis or infection: Secondary | ICD-10-CM

## 2017-04-08 LAB — BASIC METABOLIC PANEL
Anion gap: 7 (ref 5–15)
BUN: <5 mg/dL — ABNORMAL LOW (ref 6–20)
CO2: 23 mmol/L (ref 22–32)
Calcium: 8.4 mg/dL — ABNORMAL LOW (ref 8.9–10.3)
Chloride: 113 mmol/L — ABNORMAL HIGH (ref 101–111)
Creatinine, Ser: 0.86 mg/dL (ref 0.44–1.00)
GFR calc non Af Amer: >60 mL/min (ref >60)
Glucose, Bld: 101 mg/dL — ABNORMAL HIGH (ref 65–99)
POTASSIUM: 3.6 mmol/L (ref 3.5–5.1)
SODIUM: 143 mmol/L (ref 135–145)

## 2017-04-08 LAB — GLUCOSE, CAPILLARY
GLUCOSE-CAPILLARY: 103 mg/dL — AB (ref 65–99)
GLUCOSE-CAPILLARY: 167 mg/dL — AB (ref 65–99)
GLUCOSE-CAPILLARY: 146 mg/dL — AB (ref 65–99)
Glucose-Capillary: 145 mg/dL — ABNORMAL HIGH (ref 65–99)

## 2017-04-08 LAB — CBC
HEMATOCRIT: 33.1 % — AB (ref 36.0–46.0)
HEMOGLOBIN: 11.2 g/dL — AB (ref 12.0–15.0)
MCH: 30.8 pg (ref 26.0–34.0)
MCHC: 33.8 g/dL (ref 30.0–36.0)
MCV: 90.9 fL (ref 78.0–100.0)
Platelets: 114 10*3/uL — ABNORMAL LOW (ref 150–400)
RBC: 3.64 MIL/uL — AB (ref 3.87–5.11)
RDW: 15.1 % (ref 11.5–15.5)
WBC: 3.1 10*3/uL — AB (ref 4.0–10.5)

## 2017-04-08 LAB — MAGNESIUM: Magnesium: 1.8 mg/dL (ref 1.7–2.4)

## 2017-04-08 MED ORDER — DULOXETINE HCL 30 MG PO CPEP
30.0000 mg | ORAL_CAPSULE | Freq: Every evening | ORAL | Status: DC
  Administered 2017-04-08: 30 mg via ORAL
  Filled 2017-04-08: qty 1

## 2017-04-08 MED ORDER — INSULIN ASPART 100 UNIT/ML ~~LOC~~ SOLN
0.0000 [IU] | Freq: Three times a day (TID) | SUBCUTANEOUS | Status: DC
  Administered 2017-04-08: 3 [IU] via SUBCUTANEOUS
  Administered 2017-04-08 – 2017-04-09 (×2): 2 [IU] via SUBCUTANEOUS

## 2017-04-08 MED ORDER — LEVOTHYROXINE SODIUM 75 MCG PO TABS
75.0000 ug | ORAL_TABLET | Freq: Every day | ORAL | Status: DC
  Administered 2017-04-08 – 2017-04-09 (×2): 75 ug via ORAL
  Filled 2017-04-08 (×2): qty 1

## 2017-04-08 MED ORDER — METOCLOPRAMIDE HCL 5 MG PO TABS
5.0000 mg | ORAL_TABLET | Freq: Three times a day (TID) | ORAL | Status: DC
  Administered 2017-04-08 – 2017-04-09 (×3): 5 mg via ORAL
  Filled 2017-04-08 (×3): qty 1

## 2017-04-08 NOTE — Progress Notes (Signed)
[facility] Progress Note    Since last GI note: She tolerated clears for lunch and full liquids for dinner.  Urinating a lot. DOE+. Still uncomfortable abdomen but getting better slowly.  No nausea, vomiting.  Objective: Vital signs in last 24 hours: Temp:  [97.4 F (36.3 C)-98.2 F (36.8 C)] 97.4 F (36.3 C) (05/18 0550) Pulse Rate:  [57-95] 95 (05/18 0550) Resp:  [17-19] 17 (05/18 0550) BP: (128-151)/(57-77) 134/70 (05/18 0550) SpO2:  [97 %-98 %] 98 % (05/18 0550) Last BM Date: 04/06/17 General: alert and oriented times 3, obese Heart: regular rate and rythm Abdomen: soft, non-tender, non-distended, normal bowel sounds   Lab Results:  Recent Labs  04/06/17 1622 04/07/17 0603 04/08/17 0644  WBC 3.7* 3.0* 3.1*  HGB 11.0* 10.8* 11.2*  PLT 110* 102* 114*  MCV 90.3 90.8 90.9    Recent Labs  04/06/17 1622 04/07/17 0603  NA 142 139  K 3.1* 3.1*  CL 114* 111  CO2 21* 19*  GLUCOSE 81 65  BUN 6 6  CREATININE 0.76 0.77  CALCIUM 7.8* 7.7*    Recent Labs  04/06/17 1622  PROT 5.8*  ALBUMIN 3.0*  AST 59*  ALT 27  ALKPHOS 110  BILITOT 2.0*  BILIDIR 0.4  IBILI 1.6*    Recent Labs  04/06/17 1622  INR 1.17    Medications: Scheduled Meds: . carvedilol  12.5 mg Oral BID WC  . diclofenac sodium  4 g Topical QID  . enoxaparin (LOVENOX) injection  40 mg Subcutaneous Q24H  . fluticasone furoate-vilanterol  1 puff Inhalation Daily  . levothyroxine  37.5 mcg Intravenous Daily  . ondansetron  4 mg Oral BID  . pantoprazole  40 mg Oral Daily   Continuous Infusions: . sodium chloride 100 mL/hr at 04/08/17 0414   PRN Meds:.acetaminophen **OR** acetaminophen, albuterol, hydrALAZINE, polyethylene glycol    Assessment/Plan: 61 y.o. female with mild acute pancreatitis, presumed due to Victoza  Non drinker, GB remotely removed, normal triglycerides.  Her AP seems to be slowly improving, she is going to try solid food this morning. She needs to  ambulate more, out in halls TID.  MAy need assist for this.  C/o DOE, has known COPD.  I'm going to stop her IV fluids since she is eating, drinking better.    Newly found to have cirrhosis, this will need out patient follow up with her GI, Dr. Laural Golden.  Likely NASH given her morbid obesity.   Milus Banister, MD  04/08/2017, 7:23 AM [facility] Pager [phone]

## 2017-04-09 DIAGNOSIS — K7469 Other cirrhosis of liver: Secondary | ICD-10-CM

## 2017-04-09 DIAGNOSIS — K7460 Unspecified cirrhosis of liver: Secondary | ICD-10-CM

## 2017-04-09 DIAGNOSIS — D61818 Other pancytopenia: Secondary | ICD-10-CM

## 2017-04-09 LAB — GLUCOSE, CAPILLARY
GLUCOSE-CAPILLARY: 134 mg/dL — AB (ref 65–99)
GLUCOSE-CAPILLARY: 131 mg/dL — AB (ref 65–99)

## 2017-04-09 MED ORDER — INSULIN GLARGINE 100 UNIT/ML ~~LOC~~ SOLN: 10.0000 [IU] | Freq: Every day | SUBCUTANEOUS | 11 refills | Status: DC

## 2017-04-09 MED ORDER — METOCLOPRAMIDE HCL 5 MG PO TABS: 5.0000 mg | ORAL_TABLET | Freq: Three times a day (TID) | ORAL | 0 refills | Status: DC

## 2017-04-09 MED ORDER — ONDANSETRON HCL 4 MG PO TABS: 4.0000 mg | ORAL_TABLET | Freq: Three times a day (TID) | ORAL | 0 refills | Status: DC | PRN

## 2017-04-09 MED ORDER — POLYETHYLENE GLYCOL 3350 17 G PO PACK: 17.0000 g | PACK | Freq: Every day | ORAL | 0 refills | Status: DC | PRN

## 2017-04-09 MED ORDER — INSULIN LISPRO 200 UNIT/ML ~~LOC~~ SOPN: 5.0000 [IU] | PEN_INJECTOR | Freq: Three times a day (TID) | SUBCUTANEOUS | Status: DC

## 2017-04-09 MED ORDER — ACETAMINOPHEN 325 MG PO TABS: 650.0000 mg | ORAL_TABLET | Freq: Four times a day (QID) | ORAL | Status: DC | PRN

## 2017-04-09 NOTE — Care Management Important Message (Signed)
Important Message  Patient Details  Name: Rachael FeeRebecha Petsch MRN: [id number] Date of Birth: Sep 07, 1956   Medicare Important Message Given:  Yes    Elliot CousinShavis, Briggs Edelen Ellen, RN 04/09/2017, 12:17 PM

## 2017-04-09 NOTE — Progress Notes (Signed)
Pt discharged to home. DC instructions given.no concerns voiced. Prescriptions given for 2 meds. Pt reminded to stop by pharmacy in [place] and pick up other meds for which prescriptions have been sent. Voiced understanding. Left unit in wheelchair pushed by nurse tech accompanied by 2 female family members. Left in good condition. Derinda SisVera Angeni Chaudhuri,rn.

## 2017-04-09 NOTE — Discharge Summary (Signed)
Physician Discharge Summary  Kimberly [name] ZOX:[id number] DOB: 26-Jul-1956 DOA: 04/06/2017  PCP: Glenda Chroman, MD  Admit date: 04/06/2017 Discharge date: 04/09/2017  Admitted From: home Disposition:  home   Recommendations for Outpatient Follow-up:  1. Need CBC in 1-2 wks and referral to hematology if she is persistently pancytopenic   Discharge Condition:  stable   CODE STATUS:  Full code   Diet recommendation:  Heart healthy, carb modified Consultations:  GI    Discharge Diagnoses:  Principal Problem:   Acute pancreatitis Active Problems:   DM (diabetes mellitus) ([place])   HTN (hypertension)   OSA (obstructive sleep apnea)   Asthma in adult   Obesity (BMI 30-39.9)   Pancytopenia ([place])   Other cirrhosis of liver (H[facility])    Subjective: Tolerating a soft diet. Still having some RUQ pain which is improving with Tylenol.   Brief Summary: 61 y/o female with h/o DM 2, HTN, asthma who presented to [facility] with abdominal pain and was found to have acute pancreatitis. As her symptoms have not improved and there was no GI subspecialty available there, she was transferred to [facility]:  Principal Problem:   Acute pancreatitis - she states she had an episode of acute pancreatitis in 2009 and she was on Metformin at the time which was discontinued. She has had a cholecystectomy and had an EUS in 2011 showing no etiology for her pancreatitis. She does not drink alcohol. - Triglyceride level found to be normal - possibly due to Victoza- this is on hold - appears to have an abdominal wall component as well and therefore have started Voltaren gel - continued to advance diet- tolerating sold food now. Stable to d/c home on low fat, carb modified diet.   Active Problems:   DM (diabetes mellitus)  - sliding scale insulin - dicussed stricter diabetes control- she has had near normal sugars while in the hospital and we have not had to give her  Lantus (takes 14 U in AM and 42 in PM) - have advised to cut back on Lantus for now and to steadily increase the dose as her oral intake increases at home.   Pancytopenia - need to be rechecked as outpt in 1-2 wks. I have explained to her that if her levels are still abnormal, she needs a referral to a local hematologist  Cirrhosis - noted in imaging- ? Due to NASH - we dicussed the pathophysiology of cirrhosis in detail and I emphasized weight loss and control of DM to help prevent progression - I have recommended she f/u with her GI doctor as well    HTN (hypertension) - Coreg  Asthma in adult - uses Breo ellipta and albuterol rescue inhaler    OSA (obstructive sleep apnea) - CPAP  Discharge Instructions  Discharge Instructions    Diet - low sodium heart healthy    Complete by:  As directed    Diet Carb Modified    Complete by:  As directed    Increase activity slowly    Complete by:  As directed      Allergies as of 04/09/2017      Reactions   Other Shortness Of Breath   Pt states a sedative agent used prior to a surgical procedure ( Pt states it "sounds like Phenergan. It should be in my records from [place].") caused an asthma attack.    Sulfa Antibiotics    (Yeast infection, per patient.)   Latex Rash   "  Blistery" rash.       Medication List    STOP taking these medications   VICTOZA 18 MG/3ML Sopn Generic drug:  liraglutide     TAKE these medications   acetaminophen 325 MG tablet Commonly known as:  TYLENOL Take 2 tablets (650 mg total) by mouth every 6 (six) hours as needed for mild pain (or Fever >/= 101).   albuterol (2.5 MG/3ML) 0.083% nebulizer solution Commonly known as:  PROVENTIL Take 2.5 mg by nebulization every 6 (six) hours as needed for wheezing or shortness of breath.   amLODipine 2.5 MG tablet Commonly known as:  NORVASC Take 2.5 mg by mouth every morning.   atorvastatin 10 MG tablet Commonly known as:  LIPITOR Take 10 mg by  mouth daily.   BREO ELLIPTA 200-25 MCG/INH Aepb Generic drug:  fluticasone furoate-vilanterol Inhale 2 puffs into the lungs every morning.   carvedilol 12.5 MG tablet Commonly known as:  COREG Take 6.25 mg by mouth 2 (two) times daily with a meal.   DULoxetine 30 MG capsule Commonly known as:  CYMBALTA Take 30 mg by mouth every evening.   famotidine 40 MG tablet Commonly known as:  PEPCID Take 40 mg by mouth every morning.   furosemide 20 MG tablet Commonly known as:  LASIX Take 20-40 mg by mouth daily.   insulin glargine 100 UNIT/ML injection Commonly known as:  LANTUS Inject 0.1 mLs (10 Units total) into the skin at bedtime. What changed:  how much to take  when to take this  additional instructions   Insulin Lispro 200 UNIT/ML Sopn Commonly known as:  HUMALOG KWIKPEN Inject 5 Units into the skin 3 (three) times daily before meals. Follow sliding scale given by your PCP What changed:  additional instructions   levothyroxine 75 MCG tablet Commonly known as:  SYNTHROID, LEVOTHROID Take 75 mcg by mouth daily before breakfast.   lisinopril 20 MG tablet Commonly known as:  PRINIVIL,ZESTRIL Take 20 mg by mouth every morning.   metoCLOPramide 5 MG tablet Commonly known as:  REGLAN Take 1 tablet (5 mg total) by mouth 3 (three) times daily before meals.   ondansetron 4 MG tablet Commonly known as:  ZOFRAN Take 1 tablet (4 mg total) by mouth every 8 (eight) hours as needed for nausea or vomiting.   polyethylene glycol packet Commonly known as:  MIRALAX / GLYCOLAX Take 17 g by mouth daily as needed for mild constipation.   potassium chloride 10 MEQ tablet Commonly known as:  K-DUR Take 5 mEq by mouth 2 (two) times daily.   traMADol 50 MG tablet Commonly known as:  ULTRAM Take by mouth every 6 (six) hours as needed (pain).      Follow-up Information    Vyas, Costella Hatcher, MD Follow up.   Specialty:  Internal Medicine Why:  obtain CBC in 1-2 wks Contact  information: [place] [address] [phone]          Allergies  Allergen Reactions  . Other Shortness Of Breath    Pt states a sedative agent used prior to a surgical procedure ( Pt states it "sounds like Phenergan. It should be in my records from [place].") caused an asthma attack.   . Sulfa Antibiotics     (Yeast infection, per patient.)  . Latex Rash    "Blistery" rash.      Procedures/Studies:    No results found.     Discharge Exam: Vitals:   04/08/17 1954 04/09/17 4585  BP: 138/84 (!) 143/78  Pulse: 65 67  Resp: 17 16  Temp: 97.8 F (36.6 C) 97.8 F (36.6 C)   Vitals:   04/08/17 0920 04/08/17 1455 04/08/17 1954 04/09/17 0452  BP:  (!) 123/50 138/84 (!) 143/78  Pulse:  62 65 67  Resp:  20 17 16   Temp:  98.2 F (36.8 C) 97.8 F (36.6 C) 97.8 F (36.6 C)  TempSrc:  Oral Oral Oral  SpO2: 94% 98% 96% 96%    General: Pt is alert, awake, not in acute distress Cardiovascular: RRR, S1/S2 +, no rubs, no gallops Respiratory: CTA bilaterally, no wheezing, no rhonchi Abdominal: Soft, NT, ND, bowel sounds + Extremities: no edema, no cyanosis    The results of significant diagnostics from this hospitalization (including imaging, microbiology, ancillary and laboratory) are listed below for reference.     Microbiology: No results found for this or any previous visit (from the past 240 hour(s)).   Labs: BNP (last 3 results) No results for input(s): BNP in the last 8760 hours. Basic Metabolic Panel:  Recent Labs Lab 04/06/17 1622 04/07/17 0603 04/07/17 1056 04/08/17 0644  NA 142 139  --  143  K 3.1* 3.1*  --  3.6  CL 114* 111  --  113*  CO2 21* 19*  --  23  GLUCOSE 81 65  --  101*  BUN 6 6  --  <5*  CREATININE 0.76 0.77  --  0.86  CALCIUM 7.8* 7.7*  --  8.4*  MG  --  1.5* 1.5* 1.8   Liver Function Tests:  Recent Labs Lab 04/06/17 1622  AST 59*  ALT 27  ALKPHOS 110  BILITOT 2.0*  PROT 5.8*  ALBUMIN 3.0*    Recent  Labs Lab 04/06/17 1622  LIPASE 114*   No results for input(s): AMMONIA in the last 168 hours. CBC:  Recent Labs Lab 04/06/17 1622 04/07/17 0603 04/08/17 0644  WBC 3.7* 3.0* 3.1*  HGB 11.0* 10.8* 11.2*  HCT 31.6* 31.5* 33.1*  MCV 90.3 90.8 90.9  PLT 110* 102* 114*   Cardiac Enzymes: No results for input(s): CKTOTAL, CKMB, CKMBINDEX, TROPONINI in the last 168 hours. BNP: Invalid input(s): POCBNP CBG:  Recent Labs Lab 04/08/17 1246 04/08/17 1709 04/08/17 2044 04/09/17 0732 04/09/17 1129  GLUCAP 167* 146* 145* 134* 131*   D-Dimer No results for input(s): DDIMER in the last 72 hours. Hgb A1c No results for input(s): HGBA1C in the last 72 hours. Lipid Profile  Recent Labs  04/07/17 0603  CHOL 138  HDL 26*  LDLCALC 84  TRIG 139  CHOLHDL 5.3   Thyroid function studies No results for input(s): TSH, T4TOTAL, T3FREE, THYROIDAB in the last 72 hours.  Invalid input(s): FREET3 Anemia work up No results for input(s): VITAMINB12, FOLATE, FERRITIN, TIBC, IRON, RETICCTPCT in the last 72 hours. Urinalysis No results found for: COLORURINE, APPEARANCEUR, LABSPEC, [place], GLUCOSEU, HGBUR, BILIRUBINUR, KETONESUR, PROTEINUR, UROBILINOGEN, NITRITE, LEUKOCYTESUR Sepsis Labs Invalid input(s): PROCALCITONIN,  WBC,  LACTICIDVEN Microbiology No results found for this or any previous visit (from the past 240 hour(s)).   Time coordinating discharge: Over 30 minutes  SIGNED:   Debbe Odea, MD  [facility] 04/09/2017, 4:15 PM Pager   If 7PM-7AM, please contact night-coverage [email or web address] Password [email or web address]

## 2017-04-09 NOTE — Progress Notes (Signed)
[facility] Progress Note    Since last GI note: Tolerating solid foods.  Ambulated in hall 6 times yesterday. Abd pain improving. Still not back to normal. + BMs.  Mild DOE.  Objective: Vital signs in last 24 hours: Temp:  [97.8 F (36.6 C)-98.2 F (36.8 C)] 97.8 F (36.6 C) (05/19 0452) Pulse Rate:  [62-67] 67 (05/19 0452) Resp:  [16-20] 16 (05/19 0452) BP: (123-143)/(50-84) 143/78 (05/19 0452) SpO2:  [94 %-98 %] 96 % (05/19 0452) Last BM Date: 04/06/17 General: alert and oriented times 3 Heart: regular rate and rythm Abdomen: soft, non-tender, non-distended, normal bowel sounds   Lab Results:  Recent Labs  04/06/17 1622 04/07/17 0603 04/08/17 0644  WBC 3.7* 3.0* 3.1*  HGB 11.0* 10.8* 11.2*  PLT 110* 102* 114*  MCV 90.3 90.8 90.9    Recent Labs  04/06/17 1622 04/07/17 0603 04/08/17 0644  NA 142 139 143  K 3.1* 3.1* 3.6  CL 114* 111 113*  CO2 21* 19* 23  GLUCOSE 81 65 101*  BUN 6 6 <5*  CREATININE 0.76 0.77 0.86  CALCIUM 7.8* 7.7* 8.4*    Recent Labs  04/06/17 1622  PROT 5.8*  ALBUMIN 3.0*  AST 59*  ALT 27  ALKPHOS 110  BILITOT 2.0*  BILIDIR 0.4  IBILI 1.6*    Recent Labs  04/06/17 1622  INR 1.17    Medications: Scheduled Meds: . carvedilol  12.5 mg Oral BID WC  . diclofenac sodium  4 g Topical QID  . DULoxetine  30 mg Oral QPM  . enoxaparin (LOVENOX) injection  40 mg Subcutaneous Q24H  . fluticasone furoate-vilanterol  1 puff Inhalation Daily  . insulin aspart  0-15 Units Subcutaneous TID WC  . levothyroxine  75 mcg Oral QAC breakfast  . metoCLOPramide  5 mg Oral TID AC  . ondansetron  4 mg Oral BID  . pantoprazole  40 mg Oral Daily   Continuous Infusions: PRN Meds:.acetaminophen **OR** acetaminophen, albuterol, hydrALAZINE, polyethylene glycol    Assessment/Plan: 61 y.o. female with mild acute pancreatitis, presumed due to Victoza  Non drinker, GB remotely removed, normal triglycerides.   She is eating,  ambulating, pain much improved.  She is OK for discharge today from my perspective. Should maintain low fat, low salt diet and try to loose weight.  She should not resume Victoza. She should be prescribed zofran 42m pills and take one pill BID on scheduled basis for the next few days as she continues to recover.  Newly found to have cirrhosis, this will need out patient follow up with her GI, Dr. RLaural Golden  Likely NASH given her morbid obesity. She knows to call his office for appt in next several weeks.  Please call or page with any further questions or concerns.    JMilus Banister MD  04/09/2017, 8:03 AM [facility] Pager [phone]

## 2017-04-11 ENCOUNTER — Encounter: Payer: Self-pay | Admitting: Cardiology

## 2017-04-20 ENCOUNTER — Encounter (INDEPENDENT_AMBULATORY_CARE_PROVIDER_SITE_OTHER): Payer: Self-pay | Admitting: Internal Medicine

## 2017-04-21 ENCOUNTER — Telehealth (INDEPENDENT_AMBULATORY_CARE_PROVIDER_SITE_OTHER): Payer: Self-pay | Admitting: *Deleted

## 2017-04-21 ENCOUNTER — Ambulatory Visit (INDEPENDENT_AMBULATORY_CARE_PROVIDER_SITE_OTHER): Payer: Medicare Other | Admitting: Internal Medicine

## 2017-04-21 ENCOUNTER — Encounter (INDEPENDENT_AMBULATORY_CARE_PROVIDER_SITE_OTHER): Payer: Self-pay

## 2017-04-21 ENCOUNTER — Encounter (INDEPENDENT_AMBULATORY_CARE_PROVIDER_SITE_OTHER): Payer: Self-pay | Admitting: *Deleted

## 2017-04-21 ENCOUNTER — Other Ambulatory Visit (INDEPENDENT_AMBULATORY_CARE_PROVIDER_SITE_OTHER): Payer: Self-pay | Admitting: Internal Medicine

## 2017-04-21 ENCOUNTER — Encounter (INDEPENDENT_AMBULATORY_CARE_PROVIDER_SITE_OTHER): Payer: Self-pay | Admitting: Internal Medicine

## 2017-04-21 VITALS — BP 170/90 | HR 60 | Temp 97.8°F | Ht 60.0 in | Wt 226.7 lb

## 2017-04-21 DIAGNOSIS — R195 Other fecal abnormalities: Secondary | ICD-10-CM

## 2017-04-21 DIAGNOSIS — R748 Abnormal levels of other serum enzymes: Secondary | ICD-10-CM

## 2017-04-21 DIAGNOSIS — K8500 Idiopathic acute pancreatitis without necrosis or infection: Secondary | ICD-10-CM

## 2017-04-21 MED ORDER — PEG 3350-KCL-NA BICARB-NACL 420 G PO SOLR: 4000.0000 mL | Freq: Once | ORAL | 0 refills | Status: AC

## 2017-04-21 NOTE — Progress Notes (Signed)
Subjective:    Patient ID: Kimberly [name], female    DOB: [birth year], 61 y.o.   MRN: [id number]  HPI Referred by Dr. Woody Seller for abnormal LFT/pancreatitis. Admitted to [facility] earlier  this month  with pancreatitis. She was transferred from [facility]  Pain in her rt flank. Her amylase was 96 and lipase was 144. Pancreatitis felt to be idiopathic or possibly due to Victoza.  Hx of pancreatitis from the Metformin years ago.. , DM. CT scan on 04/02/2017 revealed minimal fluid/stranding along the pancreatic tail, suggesting mild acute pancreatitis.  No drain able fluid collection, pseudocyst. Mild nodular hepatic contour suggesting cirrhosis.  Spleen is at the upper limits of normal for size.  Splenorenal shunt. Portal vein is patent.  She tells me she is doing better. She continues to have some rt flank pain. She feels tired. She tells me she alternates between constipation and diarrhea which is normal for her.  Her appetite not good. She thinks she may have lost about 14 pounds. She says she has been trying to eat right.   She is on a low fat, low sodium diet.  Her last colonoscopy was in 2014 for high risk colon cancer screening.  Two brothers in their 27  who had colon cancer.  Impression:  Examination performed to cecum. Few small diverticula and sigmoid colon. Internal/external hemorrhoids.    She is having a BM about x 1 a day.  04/02/2017 total bili 1.3, AST 29.6, ALT 13, ALP 77.  H and H 11.4 and 34.6, platelet ct 139  03/28/2017 fecal occult blood positive  04/04/2017 US abdomen complete: Prior cholecystectomy. Pancreas is suboptimally visualized. Increased hepatic echogenicity as seen with hepatic steatosis.  Review of Systems Past Medical History:  Diagnosis Date  . Anemia    PMH: as a child  . Arthritis   . Asthma   . CAD (coronary artery disease)    nonobstructive by cath, 6/08 (false positive Cardiolite) normal stress echo, 7/11  . CHF (congestive heart failure) ([place])   .  Chronic back pain   . Chronic bronchitis ([place])   . Complication of anesthesia    had an asthma attack when woke up from procedure  . Complication of anesthesia    asthma attack with one surgery  . COPD (chronic obstructive pulmonary disease) ([place])   . Degenerative joint disease   . Depression   . Diabetes mellitus without complication ([place])   . DJD (degenerative joint disease)   . Fatty liver   . Fibromyalgia   . GERD (gastroesophageal reflux disease)   . Glaucoma   . Headache(784.0)   . Heart failure, diastolic, chronic ([place])   . Heart murmur    PMH:As a child only  . Hernia of abdominal cavity    "upper and lower hernia"  . History of hiatal hernia   . HTN (hypertension)   . Hypertension   . Hypothyroidism   . IBS (irritable bowel syndrome)   . IDDM (insulin dependent diabetes mellitus) ([place])   . Morbid obesity ([place])   . Neuropathy    associated with diabetes  . Obstructive sleep apnea   . Pancreatitis   . Pinched nerve in neck   . Pneumonia    as a child  . Pulmonary hypertension ([place])   . Rheumatic fever    PMH: as a child  . Seizures ([place])    PMH: only as a child  . Seizures ([place])    in childhood  . Shortness  of breath   . Sleep apnea   . Spinal stenosis   . Stroke [facility])    12/09/2013    Past Surgical History:  Procedure Laterality Date  . ABDOMINAL HYSTERECTOMY    . ABDOMINAL HYSTERECTOMY     partial hysterectomy  . APPENDECTOMY    . BACK SURGERY    . BACK SURGERY     disc surgery with complications and damage to right side  . BILATERAL KNEE ARTHROSCOPY  2000, 2009  . CARDIAC CATHETERIZATION     2009  . CATARACT EXTRACTION W/ INTRAOCULAR LENS  IMPLANT, BILATERAL    . CHOLECYSTECTOMY  1994  . CHOLECYSTECTOMY    . COLONOSCOPY N/A 08/01/2013   Procedure: COLONOSCOPY;  Surgeon: Rogene Houston, MD;  Location: [facility];  Service: Endoscopy;  Laterality: N/A;  930  . CYST EXCISION     Left breast  . EXCISIONAL HEMORRHOIDECTOMY    . [place]  . EYE SURGERY     bilateral cataract removal with lens implants  . EYE SURGERY     growth removed from right eye lid  . EYE SURGERY     bilateral eye surgery age 44 t"to straighten eyes'  . heel (other)  2005  . HEEL SPUR SURGERY     Right heel - growth removal and rebuilding of heel  . HEMORRHOID SURGERY    . HERNIA REPAIR    . KNEE ARTHROSCOPY Bilateral   . LUMBAR LAMINECTOMY/DECOMPRESSION MICRODISCECTOMY Right 08/23/2014   Procedure: LUMBAR LAMINECTOMY/DECOMPRESSION MICRODISCECTOMY 1 LEVEL;  Surgeon: Charlie Pitter, MD;  Location: [facility];  Service: Neurosurgery;  Laterality: Right;  LUMBAR LAMINECTOMY/DECOMPRESSION MICRODISCECTOMY 1 LEVEL LUMBAR 2-3  . ovaries removed  2003  . PARTIAL HYSTERECTOMY  1981  . SPHINCTEROTOMY    . spinahatomy  1992  . TUBAL LIGATION  1975  . TUBAL LIGATION      Allergies  Allergen Reactions  . Hydrocodone Other (See Comments)    sedation  . Naproxen Anaphylaxis    REACTION: throat swelling  . Other Shortness Of Breath and Other (See Comments)    Local anesthesia Had asthma attack  . Other Shortness Of Breath    Pt states a sedative agent used prior to a surgical procedure ( Pt states it "sounds like Phenergan. It should be in my records from [facility].") caused an asthma attack.   . Oxycodone Other (See Comments)    sedation  . Sulfonamide Derivatives Other (See Comments)    Yeast infection  . Cinnamon   . Feldene [Piroxicam] Nausea Only  . Lyrica [Pregabalin] Other (See Comments)  . Phenergan [Promethazine Hcl] Other (See Comments)    triggers asthma attacks  . Sulfa Antibiotics     (Yeast infection, per patient.)  . Latex Rash  . Latex Rash    "Blistery" rash.   . Tape Rash    Do not use adhesive tape; okay to use paper tape.    Current Outpatient Prescriptions on File Prior to Visit  Medication Sig Dispense Refill  . acetaminophen (TYLENOL) 325 MG tablet Take 2 tablets (650 mg total) by mouth every 6 (six) hours as needed  for mild pain (or Fever >/= 101).    Marland Kitchen albuterol (PROVENTIL) (2.5 MG/3ML) 0.083% nebulizer solution Inhale 3 mLs into the lungs every 6 (six) hours as needed.    Marland Kitchen albuterol (PROVENTIL) (2.5 MG/3ML) 0.083% nebulizer solution Take 2.5 mg by nebulization every 6 (six) hours as needed for wheezing or shortness  of breath.    Marland Kitchen amLODipine (NORVASC) 2.5 MG tablet Take 1 tablet by mouth daily.    Marland Kitchen aspirin EC 81 MG tablet Take 81 mg by mouth daily.    Marland Kitchen atorvastatin (LIPITOR) 10 MG tablet Take 10 mg by mouth every morning.     . carvedilol (COREG) 12.5 MG tablet Take 12.5 mg by mouth 2 (two) times daily with a meal.    . DULoxetine (CYMBALTA) 30 MG capsule Take 30 mg by mouth daily.    . fluticasone furoate-vilanterol (BREO ELLIPTA) 200-25 MCG/INH AEPB Inhale 1 puff into the lungs daily.    . furosemide (LASIX) 20 MG tablet Take 20 mg by mouth daily.     . insulin glargine (LANTUS) 100 UNIT/ML injection Inject 0.1 mLs (10 Units total) into the skin at bedtime. 10 mL 11  . Insulin Lispro (HUMALOG KWIKPEN) 200 UNIT/ML SOPN Inject 5 Units into the skin 3 (three) times daily before meals. Follow sliding scale given by your PCP    . levothyroxine (SYNTHROID, LEVOTHROID) 75 MCG tablet Take 75 mcg by mouth daily before breakfast.    . lisinopril (PRINIVIL,ZESTRIL) 20 MG tablet Take 1 tablet by mouth daily.    . metoCLOPramide (REGLAN) 5 MG tablet Take 5 mg by mouth 4 (four) times daily.    . OXYGEN Inhale 2.5 L into the lungs at bedtime.    . polyethylene glycol (MIRALAX / GLYCOLAX) packet Take 17 g by mouth daily as needed for mild constipation. 14 each 0  . potassium chloride (K-DUR) 10 MEQ tablet Take 5 mEq by mouth 2 (two) times daily.     . carvedilol (COREG) 12.5 MG tablet Take 6.25 mg by mouth 2 (two) times daily with a meal.     . Cyanocobalamin (B-12) 1000 MCG/ML KIT Inject 1 mL as directed every 30 (thirty) days.    . famotidine (PEPCID) 40 MG tablet Take 40 mg by mouth every morning.    . traMADol  (ULTRAM) 50 MG tablet Take by mouth every 6 (six) hours as needed (pain).    . Vitamin D, Ergocalciferol, (DRISDOL) 50000 units CAPS capsule Take 50,000 Units by mouth every 7 (seven) days.     No current facility-administered medications on file prior to visit.         Objective:   Physical Exam. Vitals:   04/21/17 1428  Weight: 226 lb 11.2 oz (102.8 kg)  Height: 5' (1.524 m)  Alert and oriented. Skin warm and dry. Oral mucosa is moist.   . Sclera anicteric, conjunctivae is pink. Thyroid not enlarged. No cervical lymphadenopathy. Lungs clear. Heart regular rate and rhythm.  Abdomen is soft. Bowel sounds are positive. No hepatomegaly. No abdominal masses felt. Epigastric tenderness.  1+ edema to lower extremities.           Assessment & Plan:  Pancreatitis Will do follow CT abdomen/pelvis with CM. Elevated liver enzymes/ possible cirrhosis. Hepatic function. Platelet ct slightly low. Will need further workup Guaiac positive stool. CBC and set up for colonoscopy.

## 2017-04-21 NOTE — Telephone Encounter (Signed)
Patient needs trilyte 

## 2017-04-21 NOTE — Patient Instructions (Addendum)
CT and labs. Colonoscopy. The risks of bleeding, perforation and infection were reviewed with patient. Stay on low fat diet. Acute Pancreatitis Acute pancreatitis is a condition in which the pancreas suddenly gets irritated and swollen (has inflammation). The pancreas is a large gland behind the stomach. It makes enzymes that help to digest food. The pancreas also makes hormones that help to control your blood sugar. Acute pancreatitis happens when the enzymes attack the pancreas and damage it. Most attacks last a couple of days and can cause serious problems. Follow these instructions at home: Eating and drinking  Follow instructions from your doctor about diet. You may need to: ? Avoid alcohol. ? Limit how much fat is in your diet.  Eat small meals often. Avoid eating big meals.  Drink enough fluid to keep your pee (urine) clear or pale yellow.  Do not drink alcohol if it caused your condition. General instructions  Take over-the-counter and prescription medicines only as told by your doctor.  Do not use any tobacco products. These include cigarettes, chewing tobacco, and e-cigarettes. If you need help quitting, ask your doctor.  Get plenty of rest.  If directed, check your blood sugar at home as told by your doctor.  Keep all follow-up visits as told by your doctor. This is important. Contact a doctor if:  You do not get better as quickly as expected.  You have new symptoms.  Your symptoms get worse.  You have lasting pain or weakness.  You continue to feel sick to your stomach (nauseous).  You get better and then you have another pain attack.  You have a fever. Get help right away if:  You cannot eat or keep fluids down.  Your pain becomes very bad.  Your skin or the white part of your eyes turns yellow (jaundice).  You throw up (vomit).  You feel dizzy or you pass out (faint).  Your blood sugar is high (over 300 mg/dL). This information is not intended to  replace advice given to you by your health care provider. Make sure you discuss any questions you have with your health care provider. Document Released: 04/26/2008 Document Revised: 04/15/2016 Document Reviewed: 08/12/2015 Elsevier Interactive Patient Education  2018 [employer].  Acute Pancreatitis Acute pancreatitis is a condition in which the pancreas suddenly becomes irritated and swollen (has inflammation). The pancreas is a gland that is located behind the stomach. It produces enzymes that help to digest food. The pancreas also releases the hormones glucagon and insulin, which help to regulate blood sugar. Damage to the pancreas occurs when the digestive enzymes from the pancreas are activated before they are released into the intestine. Most acute attacks last a couple of days and can cause serious problems. Some people become dehydrated and develop low blood pressure. In severe cases, bleeding into the pancreas can lead to shock and can be life-threatening. The lungs, heart, and kidneys may fail. What are the causes? The most common causes of this condition are:  Alcohol abuse.  Gallstones.  Other causes include:  Certain medicines.  Exposure to certain chemicals.  Infection.  Damage caused by an accident (trauma).  Abdominal surgery.  In some cases, the cause may not be known. What are the signs or symptoms? Symptoms of this condition include:  Pain in the upper abdomen that may radiate to the back.  Tenderness and swelling of the abdomen.  Nausea and vomiting.  How is this diagnosed? This condition may be diagnosed based on:  A physical  exam.  Blood tests.  Imaging tests, such as X-rays, CT scans, or an ultrasound of the abdomen.  How is this treated? Treatment for this condition usually requires a stay in the hospital. Treatment may include:  Pain medicine.  Fluid replacement through an IV tube.  Placing a tube in the stomach to remove stomach  contents and to control vomiting (NG tube, or nasogastric tube).  Not eating for 3-4 days. This gives the pancreas a rest, because enzymes are not being produced that can cause further damage.  Antibiotic medicines, if your condition is caused by an infection.  Surgery on the pancreas or gallbladder.  Follow these instructions at home: Eating and drinking  Follow instructions from your health care provider about diet. This may involve avoiding alcohol and decreasing the amount of fat in your diet.  Eat smaller, more frequent meals. This reduces the amount of digestive fluids that the pancreas produces.  Drink enough fluid to keep your urine clear or pale yellow.  Do not drink alcohol if it caused your condition. General instructions  Take over-the-counter and prescription medicines only as told by your health care provider.  Do not use any tobacco products, such as cigarettes, chewing tobacco, and e-cigarettes. If you need help quitting, ask your health care provider.  Get plenty of rest.  If directed, check your blood sugar at home as told by your health care provider.  Keep all follow-up visits as told by your health care provider. This is important. Contact a health care provider if:  You do not recover as quickly as expected.  You develop new or worsening symptoms.  You have persistent pain, weakness, or nausea.  You recover and then have another episode of pain.  You have a fever. Get help right away if:  You cannot eat or keep fluids down.  Your pain becomes severe.  Your skin or the white part of your eyes turns yellow (jaundice).  You vomit.  You feel dizzy or you faint.  Your blood sugar is high (over 300 mg/dL). This information is not intended to replace advice given to you by your health care provider. Make sure you discuss any questions you have with your health care provider. Document Released: 11/08/2005 Document Revised: 03/17/2016 Document  Reviewed: 08/12/2015 Elsevier Interactive Patient Education  [employer]2018 Elsevier Inc.

## 2017-04-22 LAB — CBC WITH DIFFERENTIAL/PLATELET
BASOS ABS: 0 {cells}/uL (ref 0–200)
Basophils Relative: 0 %
EOS PCT: 0 %
Eosinophils Absolute: 0 cells/uL — ABNORMAL LOW (ref 15–500)
HCT: 39.4 % (ref 35.0–45.0)
Hemoglobin: 13 g/dL (ref 11.7–15.5)
Lymphocytes Relative: 35 %
Lymphs Abs: 1890 cells/uL (ref 850–3900)
MCH: 30.8 pg (ref 27.0–33.0)
MCHC: 33 g/dL (ref 32.0–36.0)
MCV: 93.4 fL (ref 80.0–100.0)
MONOS PCT: 6 %
MPV: 10.1 fL (ref 7.5–12.5)
Monocytes Absolute: 324 cells/uL (ref 200–950)
NEUTROS ABS: 3186 {cells}/uL (ref 1500–7800)
NEUTROS PCT: 59 %
PLATELETS: 199 10*3/uL (ref 140–400)
RBC: 4.22 MIL/uL (ref 3.80–5.10)
RDW: 16 % — ABNORMAL HIGH (ref 11.0–15.0)
WBC: 5.4 10*3/uL (ref 3.8–10.8)

## 2017-04-22 LAB — HEPATIC FUNCTION PANEL
ALBUMIN: 4 g/dL (ref 3.6–5.1)
ALK PHOS: 82 U/L (ref 33–130)
ALT: 19 U/L (ref 6–29)
AST: 41 U/L — AB (ref 10–35)
BILIRUBIN TOTAL: 1.5 mg/dL — AB (ref 0.2–1.2)
Bilirubin, Direct: 0.3 mg/dL — ABNORMAL HIGH (ref <=0.2)
Indirect Bilirubin: 1.2 mg/dL (ref 0.2–1.2)
TOTAL PROTEIN: 6.9 g/dL (ref 6.1–8.1)

## 2017-04-22 LAB — AMYLASE: AMYLASE: 63 U/L (ref 21–101)

## 2017-04-22 LAB — LIPASE: LIPASE: 73 U/L — AB (ref 7–60)

## 2017-04-27 ENCOUNTER — Other Ambulatory Visit (INDEPENDENT_AMBULATORY_CARE_PROVIDER_SITE_OTHER): Payer: Self-pay | Admitting: *Deleted

## 2017-04-27 ENCOUNTER — Encounter (INDEPENDENT_AMBULATORY_CARE_PROVIDER_SITE_OTHER): Payer: Self-pay | Admitting: *Deleted

## 2017-04-27 DIAGNOSIS — K859 Acute pancreatitis without necrosis or infection, unspecified: Secondary | ICD-10-CM

## 2017-04-27 DIAGNOSIS — K7469 Other cirrhosis of liver: Secondary | ICD-10-CM

## 2017-04-27 DIAGNOSIS — K760 Fatty (change of) liver, not elsewhere classified: Secondary | ICD-10-CM

## 2017-05-06 ENCOUNTER — Ambulatory Visit (HOSPITAL_COMMUNITY)
Admission: RE | Admit: 2017-05-06 | Discharge: 2017-05-06 | Disposition: A | Discharge status: Home / Self Care | Payer: Medicare Other | Source: Ambulatory Visit | Attending: Internal Medicine | Admitting: Internal Medicine

## 2017-05-06 DIAGNOSIS — R161 Splenomegaly, not elsewhere classified: Secondary | ICD-10-CM | POA: Insufficient documentation

## 2017-05-06 DIAGNOSIS — I700 Atherosclerosis of aorta: Secondary | ICD-10-CM | POA: Diagnosis not present

## 2017-05-06 DIAGNOSIS — K8500 Idiopathic acute pancreatitis without necrosis or infection: Secondary | ICD-10-CM | POA: Insufficient documentation

## 2017-05-06 DIAGNOSIS — K439 Ventral hernia without obstruction or gangrene: Secondary | ICD-10-CM | POA: Diagnosis not present

## 2017-05-06 MED ORDER — IOPAMIDOL (ISOVUE-300) INJECTION 61%
100.0000 mL | Freq: Once | INTRAVENOUS | Status: AC | PRN
  Administered 2017-05-06: 100 mL via INTRAVENOUS

## 2017-05-07 LAB — HEPATIC FUNCTION PANEL
ALT: 16 U/L (ref 6–29)
AST: 30 U/L (ref 10–35)
Albumin: 3.7 g/dL (ref 3.6–5.1)
Alkaline Phosphatase: 70 U/L (ref 33–130)
BILIRUBIN DIRECT: 0.4 mg/dL — AB (ref <=0.2)
BILIRUBIN TOTAL: 2.5 mg/dL — AB (ref 0.2–1.2)
Indirect Bilirubin: 2.1 mg/dL — ABNORMAL HIGH (ref 0.2–1.2)
Total Protein: 6.4 g/dL (ref 6.1–8.1)

## 2017-05-07 LAB — HEPATITIS PANEL, ACUTE
HCV AB: NEGATIVE
HEP A IGM: NONREACTIVE
HEP B C IGM: NONREACTIVE
HEP B S AG: NEGATIVE

## 2017-05-07 LAB — PROTIME-INR
INR: 1
Prothrombin Time: 11 s (ref 9.0–11.5)

## 2017-05-09 LAB — ALPHA-1-ANTITRYPSIN: A-1 Antitrypsin, Ser: 147 mg/dL (ref 83–199)

## 2017-05-09 LAB — ANTI-NUCLEAR AB-TITER (ANA TITER)

## 2017-05-09 LAB — ANTI-SMOOTH MUSCLE ANTIBODY, IGG: Smooth Muscle Ab: <20 U (ref <20)

## 2017-05-09 LAB — ANA: ANA: POSITIVE — AB

## 2017-05-09 LAB — CERULOPLASMIN: Ceruloplasmin: 29 mg/dL (ref 18–53)

## 2017-05-10 ENCOUNTER — Telehealth (INDEPENDENT_AMBULATORY_CARE_PROVIDER_SITE_OTHER): Payer: Self-pay | Admitting: Internal Medicine

## 2017-05-10 NOTE — Telephone Encounter (Signed)
Patient called, she would like her lab results and her CT results.  [phone]

## 2017-05-11 NOTE — Telephone Encounter (Signed)
Patient is unavailable at this time.

## 2017-05-12 ENCOUNTER — Encounter (INDEPENDENT_AMBULATORY_CARE_PROVIDER_SITE_OTHER): Payer: Self-pay | Admitting: *Deleted

## 2017-05-12 ENCOUNTER — Other Ambulatory Visit (INDEPENDENT_AMBULATORY_CARE_PROVIDER_SITE_OTHER): Payer: Self-pay | Admitting: *Deleted

## 2017-05-12 DIAGNOSIS — K8500 Idiopathic acute pancreatitis without necrosis or infection: Secondary | ICD-10-CM

## 2017-05-12 NOTE — Telephone Encounter (Signed)
Results have been given to patient.

## 2017-05-18 ENCOUNTER — Telehealth (INDEPENDENT_AMBULATORY_CARE_PROVIDER_SITE_OTHER): Payer: Self-pay | Admitting: Internal Medicine

## 2017-05-18 LAB — COMPREHENSIVE METABOLIC PANEL
ALK PHOS: 74 U/L (ref 33–130)
ALT: 12 U/L (ref 6–29)
AST: 29 U/L (ref 10–35)
Albumin: 3.6 g/dL (ref 3.6–5.1)
BILIRUBIN TOTAL: 2.2 mg/dL — AB (ref 0.2–1.2)
BUN: 8 mg/dL (ref 7–25)
CO2: 25 mmol/L (ref 20–31)
Calcium: 9.1 mg/dL (ref 8.6–10.4)
Chloride: 104 mmol/L (ref 98–110)
Creat: 0.77 mg/dL (ref 0.50–0.99)
GLUCOSE: 185 mg/dL — AB (ref 65–99)
POTASSIUM: 3.5 mmol/L (ref 3.5–5.3)
Sodium: 139 mmol/L (ref 135–146)
Total Protein: 6.4 g/dL (ref 6.1–8.1)

## 2017-05-18 NOTE — Telephone Encounter (Signed)
Patient presented to the office to get lab orders and also wanted to check on the CT that she stated that you wanted her to repeat.  I do not see this and Lelon Frohlich says she doesn't have this from you.  Please advise.  Her phone is not working, you can call her son's # and leave a message  Kimberly [name] [phone]

## 2017-05-18 NOTE — Telephone Encounter (Signed)
Kimberly [name], Repeat CT abdomen/pelvis in 4 weeks.

## 2017-05-18 NOTE — Telephone Encounter (Signed)
Message left on answering machine

## 2017-05-19 ENCOUNTER — Telehealth (INDEPENDENT_AMBULATORY_CARE_PROVIDER_SITE_OTHER): Payer: Self-pay | Admitting: Internal Medicine

## 2017-05-19 DIAGNOSIS — K8500 Idiopathic acute pancreatitis without necrosis or infection: Secondary | ICD-10-CM

## 2017-05-19 NOTE — Telephone Encounter (Signed)
Ann, order in in

## 2017-05-19 NOTE — Telephone Encounter (Signed)
I need order put in for CT

## 2017-05-20 LAB — ANTI-SMOOTH MUSCLE ANTIBODY, IGG: Smooth Muscle Ab: <20 U (ref <20)

## 2017-05-26 NOTE — Telephone Encounter (Signed)
CT A/P sch'd 06/07/17 at 200 (145), pick up contrast, left detailed message for patient

## 2017-05-27 ENCOUNTER — Other Ambulatory Visit (INDEPENDENT_AMBULATORY_CARE_PROVIDER_SITE_OTHER): Payer: Self-pay | Admitting: *Deleted

## 2017-05-27 DIAGNOSIS — K760 Fatty (change of) liver, not elsewhere classified: Secondary | ICD-10-CM

## 2017-05-27 DIAGNOSIS — R7401 Elevation of levels of liver transaminase levels: Secondary | ICD-10-CM

## 2017-05-27 DIAGNOSIS — R740 Nonspecific elevation of levels of transaminase and lactic acid dehydrogenase [LDH]: Secondary | ICD-10-CM

## 2017-05-27 LAB — COMPREHENSIVE METABOLIC PANEL
ALT: 11 U/L (ref 6–29)
AST: 26 U/L (ref 10–35)
Albumin: 3.6 g/dL (ref 3.6–5.1)
Alkaline Phosphatase: 76 U/L (ref 33–130)
BUN: 9 mg/dL (ref 7–25)
CHLORIDE: 105 mmol/L (ref 98–110)
CO2: 25 mmol/L (ref 20–31)
Calcium: 8.6 mg/dL (ref 8.6–10.4)
Creat: 0.83 mg/dL (ref 0.50–0.99)
Glucose, Bld: 134 mg/dL — ABNORMAL HIGH (ref 65–99)
Potassium: 3.5 mmol/L (ref 3.5–5.3)
Sodium: 141 mmol/L (ref 135–146)
TOTAL PROTEIN: 6.1 g/dL (ref 6.1–8.1)
Total Bilirubin: 1.4 mg/dL — ABNORMAL HIGH (ref 0.2–1.2)

## 2017-05-30 ENCOUNTER — Telehealth (INDEPENDENT_AMBULATORY_CARE_PROVIDER_SITE_OTHER): Payer: Self-pay | Admitting: Internal Medicine

## 2017-05-30 NOTE — Telephone Encounter (Signed)
Patient called, she would like her lab results.  She would also like to know when her CT scan is scheduled.  Terri, when you call her, please let her know her CT is scheduled for 06/07/17 at 2pm.  [phone]

## 2017-05-31 NOTE — Telephone Encounter (Signed)
I have spoken with patient 

## 2017-06-07 ENCOUNTER — Ambulatory Visit (HOSPITAL_COMMUNITY)
Admission: RE | Admit: 2017-06-07 | Discharge: 2017-06-07 | Disposition: A | Discharge status: Home / Self Care | Payer: Medicare Other | Source: Ambulatory Visit | Attending: Internal Medicine | Admitting: Internal Medicine

## 2017-06-07 ENCOUNTER — Encounter (HOSPITAL_COMMUNITY): Payer: Self-pay

## 2017-06-07 DIAGNOSIS — K8500 Idiopathic acute pancreatitis without necrosis or infection: Secondary | ICD-10-CM | POA: Insufficient documentation

## 2017-06-07 DIAGNOSIS — I868 Varicose veins of other specified sites: Secondary | ICD-10-CM | POA: Diagnosis not present

## 2017-06-07 DIAGNOSIS — K7460 Unspecified cirrhosis of liver: Secondary | ICD-10-CM | POA: Diagnosis not present

## 2017-06-07 MED ORDER — IOPAMIDOL (ISOVUE-300) INJECTION 61%
100.0000 mL | Freq: Once | INTRAVENOUS | Status: AC | PRN
  Administered 2017-06-07: 100 mL via INTRAVENOUS

## 2017-06-13 ENCOUNTER — Other Ambulatory Visit (INDEPENDENT_AMBULATORY_CARE_PROVIDER_SITE_OTHER): Payer: Self-pay | Admitting: *Deleted

## 2017-06-13 DIAGNOSIS — K8500 Idiopathic acute pancreatitis without necrosis or infection: Secondary | ICD-10-CM

## 2017-06-15 ENCOUNTER — Encounter (INDEPENDENT_AMBULATORY_CARE_PROVIDER_SITE_OTHER): Payer: Self-pay | Admitting: Internal Medicine

## 2017-06-15 NOTE — Progress Notes (Signed)
Patient was given an appointment for 12/12/17 at 10:15am with Deberah Castle, NP.  A letter was mailed to the patient.

## 2017-07-21 ENCOUNTER — Telehealth (INDEPENDENT_AMBULATORY_CARE_PROVIDER_SITE_OTHER): Payer: Self-pay | Admitting: *Deleted

## 2017-07-21 NOTE — Telephone Encounter (Signed)
Charleston Ropeserri - Amyriah called the office on 07/20/2017,left a message asking when is her next lab work due. She has been moving and not sure when it is. I have looked and I do not see anything. Her call back number is [phone].

## 2017-07-26 NOTE — Telephone Encounter (Signed)
I have spoken with patient. Will receive letter when labs are due

## 2017-08-03 ENCOUNTER — Encounter (HOSPITAL_COMMUNITY)
Admission: RE | Disposition: A | Discharge status: Home / Self Care | Payer: Self-pay | Source: Ambulatory Visit | Attending: Internal Medicine

## 2017-08-03 ENCOUNTER — Encounter (HOSPITAL_COMMUNITY): Payer: Self-pay | Admitting: *Deleted

## 2017-08-03 ENCOUNTER — Ambulatory Visit (HOSPITAL_COMMUNITY)
Admission: RE | Admit: 2017-08-03 | Discharge: 2017-08-03 | Disposition: A | Discharge status: Home / Self Care | Payer: Medicare Other | Source: Ambulatory Visit | Attending: Internal Medicine | Admitting: Internal Medicine

## 2017-08-03 DIAGNOSIS — I272 Pulmonary hypertension, unspecified: Secondary | ICD-10-CM | POA: Insufficient documentation

## 2017-08-03 DIAGNOSIS — F329 Major depressive disorder, single episode, unspecified: Secondary | ICD-10-CM | POA: Insufficient documentation

## 2017-08-03 DIAGNOSIS — I110 Hypertensive heart disease with heart failure: Secondary | ICD-10-CM | POA: Diagnosis not present

## 2017-08-03 DIAGNOSIS — M797 Fibromyalgia: Secondary | ICD-10-CM | POA: Diagnosis not present

## 2017-08-03 DIAGNOSIS — E119 Type 2 diabetes mellitus without complications: Secondary | ICD-10-CM | POA: Diagnosis not present

## 2017-08-03 DIAGNOSIS — K219 Gastro-esophageal reflux disease without esophagitis: Secondary | ICD-10-CM | POA: Insufficient documentation

## 2017-08-03 DIAGNOSIS — Z8673 Personal history of transient ischemic attack (TIA), and cerebral infarction without residual deficits: Secondary | ICD-10-CM | POA: Diagnosis not present

## 2017-08-03 DIAGNOSIS — M1990 Unspecified osteoarthritis, unspecified site: Secondary | ICD-10-CM | POA: Insufficient documentation

## 2017-08-03 DIAGNOSIS — Z800 Family history of malignant neoplasm of digestive organs: Secondary | ICD-10-CM | POA: Insufficient documentation

## 2017-08-03 DIAGNOSIS — K5730 Diverticulosis of large intestine without perforation or abscess without bleeding: Secondary | ICD-10-CM | POA: Insufficient documentation

## 2017-08-03 DIAGNOSIS — G4733 Obstructive sleep apnea (adult) (pediatric): Secondary | ICD-10-CM | POA: Diagnosis not present

## 2017-08-03 DIAGNOSIS — E039 Hypothyroidism, unspecified: Secondary | ICD-10-CM | POA: Diagnosis not present

## 2017-08-03 DIAGNOSIS — Z794 Long term (current) use of insulin: Secondary | ICD-10-CM | POA: Diagnosis not present

## 2017-08-03 DIAGNOSIS — K648 Other hemorrhoids: Secondary | ICD-10-CM | POA: Insufficient documentation

## 2017-08-03 DIAGNOSIS — Z6841 Body Mass Index (BMI) 40.0 and over, adult: Secondary | ICD-10-CM | POA: Insufficient documentation

## 2017-08-03 DIAGNOSIS — Z79899 Other long term (current) drug therapy: Secondary | ICD-10-CM | POA: Insufficient documentation

## 2017-08-03 DIAGNOSIS — I5032 Chronic diastolic (congestive) heart failure: Secondary | ICD-10-CM | POA: Insufficient documentation

## 2017-08-03 DIAGNOSIS — I2510 Atherosclerotic heart disease of native coronary artery without angina pectoris: Secondary | ICD-10-CM | POA: Insufficient documentation

## 2017-08-03 DIAGNOSIS — K589 Irritable bowel syndrome without diarrhea: Secondary | ICD-10-CM | POA: Insufficient documentation

## 2017-08-03 DIAGNOSIS — Z85038 Personal history of other malignant neoplasm of large intestine: Secondary | ICD-10-CM | POA: Diagnosis not present

## 2017-08-03 DIAGNOSIS — J449 Chronic obstructive pulmonary disease, unspecified: Secondary | ICD-10-CM | POA: Diagnosis not present

## 2017-08-03 DIAGNOSIS — Z7982 Long term (current) use of aspirin: Secondary | ICD-10-CM | POA: Diagnosis not present

## 2017-08-03 DIAGNOSIS — R195 Other fecal abnormalities: Secondary | ICD-10-CM | POA: Insufficient documentation

## 2017-08-03 DIAGNOSIS — Z882 Allergy status to sulfonamides status: Secondary | ICD-10-CM | POA: Insufficient documentation

## 2017-08-03 HISTORY — PX: COLONOSCOPY: SHX5424

## 2017-08-03 LAB — GLUCOSE, CAPILLARY: GLUCOSE-CAPILLARY: 102 mg/dL — AB (ref 65–99)

## 2017-08-03 SURGERY — COLONOSCOPY
Anesthesia: Moderate Sedation

## 2017-08-03 MED ORDER — MEPERIDINE HCL 50 MG/ML IJ SOLN
INTRAMUSCULAR | Status: AC
  Filled 2017-08-03: qty 1

## 2017-08-03 MED ORDER — MIDAZOLAM HCL 5 MG/5ML IJ SOLN
INTRAMUSCULAR | Status: DC | PRN
  Administered 2017-08-03: 2 mg via INTRAVENOUS
  Administered 2017-08-03: 1 mg via INTRAVENOUS
  Administered 2017-08-03: 3 mg via INTRAVENOUS
  Administered 2017-08-03 (×2): 2 mg via INTRAVENOUS

## 2017-08-03 MED ORDER — SODIUM CHLORIDE 0.9 % IV SOLN
INTRAVENOUS | Status: DC
  Administered 2017-08-03: 12:00:00 via INTRAVENOUS

## 2017-08-03 MED ORDER — STERILE WATER FOR IRRIGATION IR SOLN
Status: DC | PRN
  Administered 2017-08-03: 2.5 mL

## 2017-08-03 MED ORDER — MEPERIDINE HCL 50 MG/ML IJ SOLN
INTRAMUSCULAR | Status: DC | PRN
  Administered 2017-08-03 (×3): 25 mg via INTRAVENOUS

## 2017-08-03 MED ORDER — MIDAZOLAM HCL 5 MG/5ML IJ SOLN
INTRAMUSCULAR | Status: AC
  Filled 2017-08-03: qty 10

## 2017-08-03 NOTE — H&P (Signed)
Kimberly [name] is an 61 y.o. female.   Chief Complaint: Patient is here for colonoscopy. HPI: patient is 61 year old Caucasian female who is here for diagnostic colonoscopy. She was found to have heme-positive stools. She also complains of intermittent hematochezia. Bleeding is described be small to moderate amount She has irregular bowel movements. She either has diarrhea and constipation. She tends abnormal bleeding when she has Diarrhea. Last colonoscopy was in September 2014 and was negative for polyps. Family history significant for colon carcinoma in 2 first-degree relatives and one second-degree relative. One brother had colon carcinoma at 87 and died of metastatic disease is 42. Another brother had surgery at age Kimberly [name] is doing fine 6 years later. Her nephew was also diagnosed with colon carcinoma at age 61. He was diagnosed with pancreatitis about 3 months ago and was treated at [facility].  She still has mild epigastric and left upper quadrant abdominal pain. Abdominal CT by 2 months ago was negative for pseudocyst but still showed some inflammatory changes towards tail of pancreas.   Past Medical History:  Diagnosis Date  . Anemia    PMH: as a child  . Arthritis   . Asthma   . CAD (coronary artery disease)    nonobstructive by cath, 6/08 (false positive Cardiolite) normal stress echo, 7/11  . CHF (congestive heart failure) ([place])   . Chronic back pain   . Chronic bronchitis ([place])   . Complication of anesthesia    had an asthma attack when woke up from procedure  . Complication of anesthesia    asthma attack with one surgery  . COPD (chronic obstructive pulmonary disease) ([place])   . Degenerative joint disease   . Depression   . Diabetes mellitus without complication ([place])   . DJD (degenerative joint disease)   . Fatty liver   . Fibromyalgia   . GERD (gastroesophageal reflux disease)   . Glaucoma   . Headache(784.0)   . Heart failure, diastolic, chronic ([place])   . Heart murmur     PMH:As a child only  . Hernia of abdominal cavity    "upper and lower hernia"  . History of hiatal hernia   . HTN (hypertension)   . Hypertension   . Hypothyroidism   . IBS (irritable bowel syndrome)   . IDDM (insulin dependent diabetes mellitus) ([place])   . Morbid obesity ([place])   . Neuropathy    associated with diabetes  . Obstructive sleep apnea   . Pancreatitis   . Pinched nerve in neck   . Pneumonia    as a child  . Pulmonary hypertension ([place])   . Rheumatic fever    PMH: as a child  . Seizures ([place])    PMH: only as a child  . Seizures ([place])    in childhood  . Shortness of breath   . Sleep apnea   . Spinal stenosis   . Stroke [facility])    12/09/2013    Past Surgical History:  Procedure Laterality Date  .     Marland Kitchen ABDOMINAL HYSTERECTOMY     partial hysterectomy  . APPENDECTOMY    .     Marland Kitchen BACK SURGERY     disc surgery with complications and damage to right side  . BILATERAL KNEE ARTHROSCOPY  2000, 2009  . CARDIAC CATHETERIZATION     2009  . CATARACT EXTRACTION W/ INTRAOCULAR LENS  IMPLANT, BILATERAL       1994  . CHOLECYSTECTOMY    . COLONOSCOPY N/A 08/01/2013  Procedure: COLONOSCOPY;  Surgeon: Rogene Houston, MD;  Location: [facility];  Service: Endoscopy;  Laterality: N/A;  930  . CYST EXCISION     Left breast  . EXCISIONAL HEMORRHOIDECTOMY    .   1967  . EYE SURGERY     bilateral cataract removal with lens implants  . EYE SURGERY     growth removed from right eye lid  . EYE SURGERY     bilateral eye surgery age 6 t"to straighten eyes'  . heel (other)  2005  . HEEL SPUR SURGERY     Right heel - growth removal and rebuilding of heel  . HEMORRHOID SURGERY    . HERNIA REPAIR    . KNEE ARTHROSCOPY Bilateral   . LUMBAR LAMINECTOMY/DECOMPRESSION MICRODISCECTOMY Right 08/23/2014   Procedure: LUMBAR LAMINECTOMY/DECOMPRESSION MICRODISCECTOMY 1 LEVEL;  Surgeon: Charlie Pitter, MD;  Location: [facility];  Service: Neurosurgery;  Laterality: Right;  LUMBAR  LAMINECTOMY/DECOMPRESSION MICRODISCECTOMY 1 LEVEL LUMBAR 2-3  . ovaries removed  2003  . PARTIAL HYSTERECTOMY  1981  . SPHINCTEROTOMY    . spinahatomy  1992  . TUBAL LIGATION  1975         Family History  Problem Relation Age of Onset  . Cancer Other   . Heart failure Other   . Colon cancer Brother    Social History:  reports that she has never smoked. She has never used smokeless tobacco. She reports that she does not drink alcohol or use drugs.  Allergies:  Allergies  Allergen Reactions  . Hydrocodone Other (See Comments)    sedation  . Naproxen Anaphylaxis and Other (See Comments)    REACTION: throat swelling  . Other Shortness Of Breath and Other (See Comments)    Local anesthesia Had asthma attack  . Other Shortness Of Breath and Other (See Comments)    Pt states a sedative agent used prior to a surgical procedure ( Pt states it "sounds like Phenergan. It should be in my records from [place].") caused an asthma attack.   . Oxycodone Other (See Comments)    sedation  . Sulfonamide Derivatives Other (See Comments)    Yeast infection  . Cinnamon Other (See Comments)    Unknown  . Feldene [Piroxicam] Nausea Only  . Lyrica [Pregabalin] Other (See Comments)    Unknown  . Phenergan [Promethazine Hcl] Other (See Comments)    triggers asthma attacks  . Sulfa Antibiotics Other (See Comments)    (Yeast infection, per patient.)  . Latex Rash  . Latex Rash and Other (See Comments)    "Blistery" rash.   . Tape Rash and Other (See Comments)    Do not use adhesive tape; okay to use paper tape.    Medications Prior to Admission  Medication Sig Dispense Refill  . acetaminophen (TYLENOL) 325 MG tablet Take 2 tablets (650 mg total) by mouth every 6 (six) hours as needed for mild pain (or Fever >/= 101).    Marland Kitchen albuterol (PROVENTIL) (2.5 MG/3ML) 0.083% nebulizer solution Inhale 2.5 mg into the lungs every 6 (six) hours as needed for wheezing or shortness of breath.     Marland Kitchen  amLODipine (NORVASC) 2.5 MG tablet Take 2.5 mg by mouth daily.     Marland Kitchen atorvastatin (LIPITOR) 10 MG tablet Take 10 mg by mouth every morning.     . betamethasone dipropionate 0.05 % lotion Apply 1 application topically daily.    . carvedilol (COREG) 12.5 MG tablet Take 6.25 mg by mouth 2 (  two) times daily with a meal.     . DULoxetine (CYMBALTA) 30 MG capsule Take 30 mg by mouth daily.    . famotidine (PEPCID) 40 MG tablet Take 40 mg by mouth every morning.    . fluticasone furoate-vilanterol (BREO ELLIPTA) 200-25 MCG/INH AEPB Inhale 1 puff into the lungs daily.    . furosemide (LASIX) 20 MG tablet Take 20 mg by mouth daily.     . insulin glargine (LANTUS) 100 UNIT/ML injection Inject 0.1 mLs (10 Units total) into the skin at bedtime. 10 mL 11  . Insulin Lispro (HUMALOG KWIKPEN) 200 UNIT/ML SOPN Inject 5 Units into the skin 3 (three) times daily before meals. Follow sliding scale given by your PCP    . levothyroxine (SYNTHROID, LEVOTHROID) 75 MCG tablet Take 75 mcg by mouth daily before breakfast.    . lisinopril (PRINIVIL,ZESTRIL) 20 MG tablet Take 20 mg by mouth daily.     . OXYGEN Inhale 2.5 L into the lungs at bedtime.    . potassium chloride (K-DUR) 10 MEQ tablet Take 5 mEq by mouth 2 (two) times daily.     . Vitamin D, Ergocalciferol, (DRISDOL) 50000 units CAPS capsule Take 50,000 Units by mouth every Monday.     Marland Kitchen aspirin EC 81 MG tablet Take 81 mg by mouth daily.    . metoCLOPramide (REGLAN) 5 MG tablet Take 5 mg by mouth 4 (four) times daily as needed for nausea or vomiting.     . polyethylene glycol (MIRALAX / GLYCOLAX) packet Take 17 g by mouth daily as needed for mild constipation. 14 each 0  . traMADol (ULTRAM) 50 MG tablet Take 50 mg by mouth every 6 (six) hours as needed for moderate pain.       Results for orders placed or performed during the hospital encounter of 08/03/17 (from the past 48 hour(s))  Glucose, capillary     Status: Abnormal   Collection Time: 08/03/17 11:32 AM   Result Value Ref Range   Glucose-Capillary 102 (H) 65 - 99 mg/dL   No results found.  ROS  Blood pressure 139/71, pulse 64, temperature 98.3 F (36.8 C), temperature source Oral, resp. rate 12, height 5' (1.524 m), weight 216 lb (98 kg), SpO2 97 %. Physical Exam  Constitutional: She appears well-developed and well-nourished.  HENT:  Mouth/Throat: Oropharynx is clear and moist.  Eyes: Conjunctivae are normal. No scleral icterus.  Neck: No thyromegaly present.  Cardiovascular: Normal rate, regular rhythm and normal heart sounds.   No murmur heard. Respiratory: Effort normal and breath sounds normal.  GI:  Abdomen is full. It is soft with mild in the anus and LUQ in mid-epigastric region. No organomegaly or masses.  Musculoskeletal: She exhibits no edema.  Lymphadenopathy:    She has no cervical adenopathy.  Neurological: She is alert.  Skin: Skin is warm and dry.     Assessment/Plan Heme-positive stool. Family history of CRC in 2 first-degree relatives and one second-degree relative. Diagnostic colonoscopy  Kimberly Laser, MD 08/03/2017, 12:24 PM

## 2017-08-03 NOTE — Discharge Instructions (Signed)
Colonoscopy, Adult, Care After This sheet gives you information about how to care for yourself after your procedure. Your health care provider may also give you more specific instructions. If you have problems or questions, contact your health care provider. What can I expect after the procedure? After the procedure, it is common to have:  A small amount of blood in your stool for 24 hours after the procedure.  Some gas.  Mild abdominal cramping or bloating.  Follow these instructions at home: General instructions   For the first 24 hours after the procedure: ? Do not drive or use machinery. ? Do not sign important documents. ? Do not drink alcohol. ? Do your regular daily activities at a slower pace than normal. ? Eat soft, easy-to-digest foods. ? Rest often.  Take over-the-counter or prescription medicines only as told by your health care provider.  It is up to you to get the results of your procedure. Ask your health care provider, or the department performing the procedure, when your results will be ready. Relieving cramping and bloating  Try walking around when you have cramps or feel bloated.  Apply heat to your abdomen as told by your health care provider. Use a heat source that your health care provider recommends, such as a moist heat pack or a heating pad. ? Place a towel between your skin and the heat source. ? Leave the heat on for 20-30 minutes. ? Remove the heat if your skin turns bright red. This is especially important if you are unable to feel pain, heat, or cold. You may have a greater risk of getting burned. Eating and drinking  Drink enough fluid to keep your urine clear or pale yellow.  Resume your normal diet as instructed by your health care provider. Avoid heavy or fried foods that are hard to digest.  Avoid drinking alcohol for as long as instructed by your health care provider. Contact a health care provider if:  You have blood in your stool 2-3  days after the procedure. Get help right away if:  You have more than a small spotting of blood in your stool.  You pass large blood clots in your stool.  Your abdomen is swollen.  You have nausea or vomiting.  You have a fever.  You have increasing abdominal pain that is not relieved with medicine. This information is not intended to replace advice given to you by your health care provider. Make sure you discuss any questions you have with your health care provider. Document Released: 06/22/2004 Document Revised: 08/02/2016 Document Reviewed: 01/20/2016 Elsevier Interactive Patient Education  2018 [employer]. Diverticulosis Diverticulosis is a condition that develops when small pouches (diverticula) form in the wall of the large intestine (colon). The colon is where water is absorbed and stool is formed. The pouches form when the inside layer of the colon pushes through weak spots in the outer layers of the colon. You may have a few pouches or many of them. What are the causes? The cause of this condition is not known. What increases the risk? The following factors may make you more likely to develop this condition:  Being older than age 75. Your risk for this condition increases with age. Diverticulosis is rare among people younger than age 6. By age 27, many people have it.  Eating a low-fiber diet.  Having frequent constipation.  Being overweight.  Not getting enough exercise.  Smoking.  Taking over-the-counter pain medicines, like aspirin and ibuprofen.  Having  a family history of diverticulosis.  What are the signs or symptoms? In most people, there are no symptoms of this condition. If you do have symptoms, they may include:  Bloating.  Cramps in the abdomen.  Constipation or diarrhea.  Pain in the lower left side of the abdomen.  How is this diagnosed? This condition is most often diagnosed during an exam for other colon problems. Because diverticulosis  usually has no symptoms, it often cannot be diagnosed independently. This condition may be diagnosed by:  Using a flexible scope to examine the colon (colonoscopy).  Taking an X-ray of the colon after dye has been put into the colon (barium enema).  Doing a CT scan.  How is this treated? You may not need treatment for this condition if you have never developed an infection related to diverticulosis. If you have had an infection before, treatment may include:  Eating a high-fiber diet. This may include eating more fruits, vegetables, and grains.  Taking a fiber supplement.  Taking a live bacteria supplement (probiotic).  Taking medicine to relax your colon.  Taking antibiotic medicines.  Follow these instructions at home:  Drink 6-8 glasses of water or more each day to prevent constipation.  Try not to strain when you have a bowel movement.  If you have had an infection before: ? Eat more fiber as directed by your health care provider or your diet and nutrition specialist (dietitian). ? Take a fiber supplement or probiotic, if your health care provider approves.  Take over-the-counter and prescription medicines only as told by your health care provider.  If you were prescribed an antibiotic, take it as told by your health care provider. Do not stop taking the antibiotic even if you start to feel better.  Keep all follow-up visits as told by your health care provider. This is important. Contact a health care provider if:  You have pain in your abdomen.  You have bloating.  You have cramps.  You have not had a bowel movement in 3 days. Get help right away if:  Your pain gets worse.  Your bloating becomes very bad.  You have a fever or chills, and your symptoms suddenly get worse.  You vomit.  You have bowel movements that are bloody or black.  You have bleeding from your rectum. Summary  Diverticulosis is a condition that develops when small pouches  (diverticula) form in the wall of the large intestine (colon).  You may have a few pouches or many of them.  This condition is most often diagnosed during an exam for other colon problems.  If you have had an infection related to diverticulosis, treatment may include increasing the fiber in your diet, taking supplements, or taking medicines. This information is not intended to replace advice given to you by your health care provider. Make sure you discuss any questions you have with your health care provider. Document Released: 08/05/2004 Document Revised: 09/27/2016 Document Reviewed: 09/27/2016 Elsevier Interactive Patient Education  2017 [employer] Resume usual medications including aspirin. High fiber diet. No driving for 24 hours. Next colonoscopy in 5 years. Office visit in 3 months.

## 2017-08-03 NOTE — Op Note (Signed)
[facility] Patient Name: Kimberly [name] Procedure Date: 08/03/2017 12:24 PM MRN: [id number] Date of Birth: 1956/03/19 Attending MD: Hildred Laser , MD CSN: [id number] Age: 62 Admit Type: Outpatient Procedure:                Colonoscopy Indications:              Heme positive stool Providers:                Hildred Laser, MD, Lurline Del, RN, Bonnetta Barry,                            Technician Referring MD:             Glenda Chroman, MD Medicines:                Meperidine 75 mg IV, Midazolam 9 mg IV Complications:            No immediate complications. Estimated Blood Loss:     Estimated blood loss was minimal. Estimated blood                            loss: none. Procedure:                Pre-Anesthesia Assessment:                           - Prior to the procedure, a History and Physical                            was performed, and patient medications and                            allergies were reviewed. The patient's tolerance of                            previous anesthesia was also reviewed. The risks                            and benefits of the procedure and the sedation                            options and risks were discussed with the patient.                            All questions were answered, and informed consent                            was obtained. Prior Anticoagulants: The patient                            last took aspirin 9 days prior to the procedure.                            ASA Grade Assessment: III - A patient with severe  systemic disease. After reviewing the risks and                            benefits, the patient was deemed in satisfactory                            condition to undergo the procedure.                           After obtaining informed consent, the colonoscope                            was passed under direct vision. Throughout the                            procedure, the patient's blood  pressure, pulse, and                            oxygen saturations were monitored continuously. The                            [id number] ([id number]) scope was introduced through                            the anus and advanced to the the cecum, identified                            by appendiceal orifice and ileocecal valve. The                            colonoscopy was somewhat difficult due to                            significant looping. Successful completion of the                            procedure was aided by increasing the dose of                            sedation medication, changing the patient's                            position, using manual pressure and withdrawing and                            reinserting the scope. The patient tolerated the                            procedure well. The quality of the bowel                            preparation was adequate to identify polyps. The  ileocecal valve, appendiceal orifice, and rectum                            were photographed. Scope In: 12:44:30 PM Scope Out: 1:13:36 PM Scope Withdrawal Time: 0 hours 7 minutes 12 seconds  Total Procedure Duration: 0 hours 29 minutes 6 seconds  Findings:      The perianal and digital rectal examinations were normal.      A single small-mouthed diverticulum was found in the hepatic flexure.      The exam was otherwise normal throughout the examined colon.      Internal hemorrhoids were found during retroflexion. The hemorrhoids       were moderate. Impression:               - Diverticulosis at the hepatic flexure.                           - Internal hemorrhoids.                           - No specimens collected. Moderate Sedation:      Moderate (conscious) sedation was administered by the endoscopy nurse       and supervised by the endoscopist. The following parameters were       monitored: oxygen saturation, heart rate, blood pressure, CO2        capnography and response to care. Total physician intraservice time was       39 minutes. Recommendation:           - Patient has a contact number available for                            emergencies. The signs and symptoms of potential                            delayed complications were discussed with the                            patient. Return to normal activities tomorrow.                            Written discharge instructions were provided to the                            patient.                           - Patient has a contact number available for                            emergencies. The signs and symptoms of potential                            delayed complications were discussed with the                            patient. Return to normal activities tomorrow.  Written discharge instructions were provided to the                            patient.                           - High fiber diet today.                           - Continue present medications.                           - Repeat colonoscopy in 5 years for screening                            purposes. Procedure Code(s):        --- Professional ---                           [phone], Colonoscopy, flexible; diagnostic, including                            collection of specimen(s) by brushing or washing,                            when performed (separate procedure)                           99152, Moderate sedation services provided by the                            same physician or other qualified health care                            professional performing the diagnostic or                            therapeutic service that the sedation supports,                            requiring the presence of an independent trained                            observer to assist in the monitoring of the                            patient's level of consciousness and physiological                             status; initial 15 minutes of intraservice time,                            patient age 77 years or older                           [phone], Moderate sedation services; each additional  15 minutes intraservice time                           99153, Moderate sedation services; each additional                            15 minutes intraservice time Diagnosis Code(s):        --- Professional ---                           K64.8, Other hemorrhoids                           R19.5, Other fecal abnormalities                           K57.30, Diverticulosis of large intestine without                            perforation or abscess without bleeding CPT copyright 2016 American Medical Association. All rights reserved. The codes documented in this report are preliminary and upon coder review may  be revised to meet current compliance requirements. Hildred Laser, MD Hildred Laser, MD 08/03/2017 1:29:46 PM This report has been signed electronically. Number of Addenda: 0

## 2017-08-05 ENCOUNTER — Encounter (HOSPITAL_COMMUNITY): Payer: Self-pay | Admitting: Internal Medicine

## 2017-08-18 ENCOUNTER — Other Ambulatory Visit (INDEPENDENT_AMBULATORY_CARE_PROVIDER_SITE_OTHER): Payer: Self-pay | Admitting: *Deleted

## 2017-08-18 ENCOUNTER — Encounter (INDEPENDENT_AMBULATORY_CARE_PROVIDER_SITE_OTHER): Payer: Self-pay | Admitting: *Deleted

## 2017-08-18 DIAGNOSIS — K8500 Idiopathic acute pancreatitis without necrosis or infection: Secondary | ICD-10-CM

## 2017-08-25 ENCOUNTER — Other Ambulatory Visit: Payer: Self-pay | Admitting: Rheumatology

## 2017-08-26 NOTE — Telephone Encounter (Signed)
Last Visit: 10/27/16  Per last office note Fibromyalgia - ANA 1:160NH, ENA negative. She has no clinical features of autoimmune disease. She does meet criteria for fibromyalgia syndrome which causes generalized pain and discomfort. I'll see her only on when necessary basis Follow-Up Instructions: Return if symptoms worsen or fail to improve, for Osteoarthritis  Okay to refill Voltaren Gel?

## 2017-08-26 NOTE — Telephone Encounter (Signed)
ok 

## 2017-09-22 ENCOUNTER — Telehealth (INDEPENDENT_AMBULATORY_CARE_PROVIDER_SITE_OTHER): Payer: Self-pay | Admitting: Internal Medicine

## 2017-09-22 NOTE — Telephone Encounter (Signed)
Patient then asked when she was supposed to have a CT done.  Stated that she gets them done every so often.  Ann and I were unable to find any documentation indicating she needed one.  Please advise.

## 2017-09-22 NOTE — Telephone Encounter (Signed)
Patient called, stated that Dr. Laural Golden had asked her if something ran in her family.  Her niece and nephew had the one that stated with "L".  She stated that Dr. Laural Golden stated to let him know, that insurance would pay for genetic test.  [phone]

## 2017-09-23 NOTE — Telephone Encounter (Signed)
I have spoken with patient 

## 2017-09-23 NOTE — Telephone Encounter (Signed)
Kimberly [name] , may I ask you to review this and see if the patient needs to have CT? Please advise her as she is concerned about having them .

## 2017-09-27 ENCOUNTER — Other Ambulatory Visit (INDEPENDENT_AMBULATORY_CARE_PROVIDER_SITE_OTHER): Payer: Self-pay | Admitting: Internal Medicine

## 2017-09-27 LAB — HEPATIC FUNCTION PANEL
AG RATIO: 1.3 (calc) (ref 1.0–2.5)
ALBUMIN MSPROF: 3.7 g/dL (ref 3.6–5.1)
ALT: 12 U/L (ref 6–29)
AST: 24 U/L (ref 10–35)
Alkaline phosphatase (APISO): 91 U/L (ref 33–130)
Bilirubin, Direct: 0.4 mg/dL — ABNORMAL HIGH (ref 0.0–0.2)
GLOBULIN: 2.8 g/dL (ref 1.9–3.7)
Indirect Bilirubin: 1.6 mg/dL (calc) — ABNORMAL HIGH (ref 0.2–1.2)
Total Bilirubin: 2 mg/dL — ABNORMAL HIGH (ref 0.2–1.2)
Total Protein: 6.5 g/dL (ref 6.1–8.1)

## 2017-12-12 ENCOUNTER — Ambulatory Visit (INDEPENDENT_AMBULATORY_CARE_PROVIDER_SITE_OTHER): Payer: Medicare Other | Admitting: Internal Medicine

## 2017-12-19 ENCOUNTER — Ambulatory Visit (INDEPENDENT_AMBULATORY_CARE_PROVIDER_SITE_OTHER): Payer: Medicare Other | Admitting: Internal Medicine

## 2017-12-19 ENCOUNTER — Encounter (INDEPENDENT_AMBULATORY_CARE_PROVIDER_SITE_OTHER): Payer: Self-pay | Admitting: *Deleted

## 2017-12-19 ENCOUNTER — Encounter (INDEPENDENT_AMBULATORY_CARE_PROVIDER_SITE_OTHER): Payer: Self-pay | Admitting: Internal Medicine

## 2017-12-19 VITALS — BP 174/100 | HR 64 | Temp 97.3°F | Ht 60.0 in | Wt 219.4 lb

## 2017-12-19 DIAGNOSIS — K7469 Other cirrhosis of liver: Secondary | ICD-10-CM | POA: Diagnosis not present

## 2017-12-19 DIAGNOSIS — K8500 Idiopathic acute pancreatitis without necrosis or infection: Secondary | ICD-10-CM

## 2017-12-19 NOTE — Progress Notes (Addendum)
Subjective:    Patient ID: Kimberly [name], female    DOB: 1956-03-10, 62 y.o.   MRN: [id number] May of 2018 226. Ht 60 in HPI Here today for f/u.  Hx of pancreatitis in May of last year. Pancreatitis felt to be idiopathic or possibly due to Victoza. Hx of pancreatitis years ago from Metformin.   Sometimes she has abdominal pain. Sometimes she feel itchy' She says her blood sugars have been running from 186-365 in am and 500-600 in the evening.  She says she has burning and itching in her vagina. (She has an appt tomorrow with Dr. Woody Seller).   Her appetite is not good. She has lost about 6 pounds.\  Follow up CT in July of 2018 revealed:    IMPRESSION: Again demonstrated is a mild hazy density in the fat of the splenic hilum adjacent to the pancreatic tail. This is unchanged over the last 2 scans and may represent an inactive appearance. There is no other sign of pancreatic inflammation. Though unlikely, the differential diagnosis does include retroperitoneal fatty tumor. One might consider a follow-up scan in 1 year to see if this has changed.   Cirrhosis of the liver. Borderline splenomegaly. Splenic varices with splenorenal shunt, all unchanged. Hx of fatty liver/cirrhosis.    04/21/2017 SMA less than 20, AN 1.80, Acute Hepatitis Panel negative. ANA positive, ceruloplasmin 29, alpha 1 antitrypsin 147, PT/INR 11.0 and 1.0  CBC    Component Value Date/Time   WBC 5.4 04/21/2017 1518   RBC 4.22 04/21/2017 1518   HGB 13.0 04/21/2017 1518   HCT 39.4 04/21/2017 1518   PLT 199 04/21/2017 1518   MCV 93.4 04/21/2017 1518   MCH 30.8 04/21/2017 1518   MCHC 33.0 04/21/2017 1518   RDW 16.0 (H) 04/21/2017 1518   LYMPHSABS 1,890 04/21/2017 1518   MONOABS 324 04/21/2017 1518   EOSABS 0 (L) 04/21/2017 1518   BASOSABS 0 04/21/2017 1518   Hepatic Function Latest Ref Rng & Units 09/27/2017 05/27/2017 05/18/2017  Total Protein 6.1 - 8.1 g/dL 6.5 6.1 6.4  Albumin 3.6 - 5.1 g/dL - 3.6 3.6    AST 10 - 35 U/L '24 26 29  ' ALT 6 - 29 U/L '12 11 12  ' Alk Phosphatase 33 - 130 U/L - 76 74  Total Bilirubin 0.2 - 1.2 mg/dL 2.0(H) 1.4(H) 2.2(H)  Bilirubin, Direct 0.0 - 0.2 mg/dL 0.4(H) - -       Underwent a colonoscopy in September of 2018 which revealed (Heme positive stool).  Impression:               - Diverticulosis at the hepatic flexure.                           - Internal hemorrhoids.     Review of Systems Past Medical History:  Diagnosis Date  . Anemia    PMH: as a child  . Arthritis   . Asthma   . CAD (coronary artery disease)    nonobstructive by cath, 6/08 (false positive Cardiolite) normal stress echo, 7/11  . CHF (congestive heart failure) ([place])   . Chronic back pain   . Chronic bronchitis ([place])   . Complication of anesthesia    had an asthma attack when woke up from procedure  . Complication of anesthesia    asthma attack with one surgery  . COPD (chronic obstructive pulmonary disease) ([place])   . Degenerative joint disease   .  Depression   . Diabetes mellitus without complication ([place])   . DJD (degenerative joint disease)   . Fatty liver   . Fibromyalgia   . GERD (gastroesophageal reflux disease)   . Glaucoma   . Headache(784.0)   . Heart failure, diastolic, chronic ([place])   . Heart murmur    PMH:As a child only  . Hernia of abdominal cavity    "upper and lower hernia"  . History of hiatal hernia   . HTN (hypertension)   . Hypertension   . Hypothyroidism   . IBS (irritable bowel syndrome)   . IDDM (insulin dependent diabetes mellitus) ([place])   . Morbid obesity ([place])   . Neuropathy    associated with diabetes  . Obstructive sleep apnea   . Pancreatitis   . Pinched nerve in neck   . Pneumonia    as a child  . Pulmonary hypertension ([place])   . Rheumatic fever    PMH: as a child  . Seizures ([place])    PMH: only as a child  . Seizures ([place])    in childhood  . Shortness of breath   . Sleep apnea   . Spinal stenosis   . Stroke [facility])     12/09/2013    Past Surgical History:  Procedure Laterality Date  . ABDOMINAL HYSTERECTOMY    . ABDOMINAL HYSTERECTOMY     partial hysterectomy  . APPENDECTOMY    . BACK SURGERY    . BACK SURGERY     disc surgery with complications and damage to right side  . BILATERAL KNEE ARTHROSCOPY  2000, 2009  . CARDIAC CATHETERIZATION     2009  . CATARACT EXTRACTION W/ INTRAOCULAR LENS  IMPLANT, BILATERAL    . CHOLECYSTECTOMY  1994  . CHOLECYSTECTOMY    . COLONOSCOPY N/A 08/01/2013   Procedure: COLONOSCOPY;  Surgeon: Rogene Houston, MD;  Location[facility];  Service: Endoscopy;  Laterality: N/A;  930  . COLONOSCOPY N/A 08/03/2017   Procedure: COLONOSCOPY;  Surgeon: Rogene Houston, MD;  Location: [facility];  Service: Endoscopy;  Laterality: N/A;  12:00  . CYST EXCISION     Left breast  . EXCISIONAL HEMORRHOIDECTOMY    . [place]  . EYE SURGERY     bilateral cataract removal with lens implants  . EYE SURGERY     growth removed from right eye lid  . EYE SURGERY     bilateral eye surgery age 39 t"to straighten eyes'  . heel (other)  2005  . HEEL SPUR SURGERY     Right heel - growth removal and rebuilding of heel  . HEMORRHOID SURGERY    . HERNIA REPAIR    . KNEE ARTHROSCOPY Bilateral   . LUMBAR LAMINECTOMY/DECOMPRESSION MICRODISCECTOMY Right 08/23/2014   Procedure: LUMBAR LAMINECTOMY/DECOMPRESSION MICRODISCECTOMY 1 LEVEL;  Surgeon: Charlie Pitter, MD;  Location: [place] [facility];  Service: Neurosurgery;  Laterality: Right;  LUMBAR LAMINECTOMY/DECOMPRESSION MICRODISCECTOMY 1 LEVEL LUMBAR 2-3  . ovaries removed  2003  . PARTIAL HYSTERECTOMY  1981  . SPHINCTEROTOMY    . spinahatomy  1992  . TUBAL LIGATION  1975  . TUBAL LIGATION      Allergies  Allergen Reactions  . Hydrocodone Other (See Comments)    sedation  . Naproxen Anaphylaxis and Other (See Comments)    REACTION: throat swelling  . Other Shortness Of Breath and Other (See Comments)    Local anesthesia Had asthma  attack  . Other Shortness Of Breath  and Other (See Comments)    Pt states a sedative agent used prior to a surgical procedure ( Pt states it "sounds like Phenergan. It should be in my records from [place].") caused an asthma attack.   . Oxycodone Other (See Comments)    sedation  . Sulfonamide Derivatives Other (See Comments)    Yeast infection  . Cinnamon Other (See Comments)    Unknown  . Feldene [Piroxicam] Nausea Only  . Lyrica [Pregabalin] Other (See Comments)    Unknown  . Phenergan [Promethazine Hcl] Other (See Comments)    triggers asthma attacks  . Sulfa Antibiotics Other (See Comments)    (Yeast infection, per patient.)  . Latex Rash  . Latex Rash and Other (See Comments)    "Blistery" rash.   . Tape Rash and Other (See Comments)    Do not use adhesive tape; okay to use paper tape.    Current Outpatient Medications on File Prior to Visit  Medication Sig Dispense Refill  . acetaminophen (TYLENOL) 325 MG tablet Take 2 tablets (650 mg total) by mouth every 6 (six) hours as needed for mild pain (or Fever >/= 101).    Marland Kitchen albuterol (PROVENTIL) (2.5 MG/3ML) 0.083% nebulizer solution Inhale 2.5 mg into the lungs every 6 (six) hours as needed for wheezing or shortness of breath.     Marland Kitchen amLODipine (NORVASC) 2.5 MG tablet Take 2.5 mg by mouth daily.     Marland Kitchen aspirin EC 81 MG tablet Take 81 mg by mouth daily.    Marland Kitchen atorvastatin (LIPITOR) 10 MG tablet Take 10 mg by mouth every morning.     . betamethasone dipropionate 0.05 % lotion Apply 1 application topically daily.    . carvedilol (COREG) 12.5 MG tablet Take 6.25 mg by mouth 2 (two) times daily with a meal.     . diclofenac sodium (VOLTAREN) 1 % GEL APPLY 4 GRAMS TO KNEE JOINTS FOUR TIMES DAILY 500 g 2  . DULoxetine (CYMBALTA) 30 MG capsule Take 30 mg by mouth daily.    . famotidine (PEPCID) 40 MG tablet Take 40 mg by mouth every morning.    . fluticasone furoate-vilanterol (BREO ELLIPTA) 200-25 MCG/INH AEPB Inhale 1 puff into the lungs  daily.    . furosemide (LASIX) 20 MG tablet Take 20 mg by mouth daily.     . insulin glargine (LANTUS) 100 UNIT/ML injection Inject 0.1 mLs (10 Units total) into the skin at bedtime. 10 mL 11  . Insulin Lispro (HUMALOG KWIKPEN) 200 UNIT/ML SOPN Inject 5 Units into the skin 3 (three) times daily before meals. Follow sliding scale given by your PCP    . levothyroxine (SYNTHROID, LEVOTHROID) 75 MCG tablet Take 75 mcg by mouth daily before breakfast.    . lisinopril (PRINIVIL,ZESTRIL) 20 MG tablet Take 20 mg by mouth daily.     . metoCLOPramide (REGLAN) 5 MG tablet Take 5 mg by mouth 4 (four) times daily as needed for nausea or vomiting.     . OXYGEN Inhale 2.5 L into the lungs at bedtime.    . potassium chloride (K-DUR) 10 MEQ tablet Take 5 mEq by mouth 2 (two) times daily.     . traMADol (ULTRAM) 50 MG tablet Take 50 mg by mouth every 6 (six) hours as needed for moderate pain.     . polyethylene glycol (MIRALAX / GLYCOLAX) packet Take 17 g by mouth daily as needed for mild constipation. 14 each 0   No current facility-administered medications on file prior to  visit.         Objective:   Physical Exam Blood pressure (!) 174/100, pulse 64, temperature (!) 97.3 F (36.3 C), height 5' (1.524 m), weight 219 lb 6.4 oz (99.5 kg). Alert and oriented. Skin warm and dry. Oral mucosa is moist.   . Sclera anicteric, conjunctivae is pink. Thyroid not enlarged. No cervical lymphadenopathy. Lungs clear. Heart regular rate and rhythm.  Abdomen is soft. Bowel sounds are positive. No hepatomegaly. No abdominal masses felt. No tenderness.  1+ edema to lower extremities.          Assessment & Plan:  Cirrhosis:Hepatic function today. US abdomen. She has normal platelets.  Pancreatitis felt to be due to diabetic medication.: She is not having any abdominal pain.

## 2017-12-19 NOTE — Patient Instructions (Signed)
Labs and [place].  OV in 6 months.

## 2017-12-20 LAB — HEPATIC FUNCTION PANEL
AG Ratio: 1.2 (calc) (ref 1.0–2.5)
ALKALINE PHOSPHATASE (APISO): 98 U/L (ref 33–130)
ALT: 13 U/L (ref 6–29)
AST: 27 U/L (ref 10–35)
Albumin: 3.8 g/dL (ref 3.6–5.1)
Bilirubin, Direct: 0.4 mg/dL — ABNORMAL HIGH (ref 0.0–0.2)
Globulin: 3.2 g/dL (calc) (ref 1.9–3.7)
Indirect Bilirubin: 1.5 mg/dL (calc) — ABNORMAL HIGH (ref 0.2–1.2)
Total Bilirubin: 1.9 mg/dL — ABNORMAL HIGH (ref 0.2–1.2)
Total Protein: 7 g/dL (ref 6.1–8.1)

## 2017-12-20 LAB — HEMOGLOBIN A1C: Hemoglobin A1C: 11.2

## 2017-12-20 LAB — ANTI-NUCLEAR AB-TITER (ANA TITER): ANA Titer 1: 1:320 {titer} — ABNORMAL HIGH

## 2017-12-20 LAB — ANA: Anti Nuclear Antibody(ANA): POSITIVE — AB

## 2017-12-28 ENCOUNTER — Ambulatory Visit (HOSPITAL_COMMUNITY)
Admission: RE | Admit: 2017-12-28 | Discharge: 2017-12-28 | Disposition: A | Discharge status: Home / Self Care | Payer: Medicare Other | Source: Ambulatory Visit | Attending: Internal Medicine | Admitting: Internal Medicine

## 2017-12-28 DIAGNOSIS — K7469 Other cirrhosis of liver: Secondary | ICD-10-CM | POA: Insufficient documentation

## 2018-02-01 ENCOUNTER — Ambulatory Visit: Payer: Medicare Other | Admitting: "Endocrinology

## 2018-02-08 ENCOUNTER — Encounter: Payer: Self-pay | Admitting: "Endocrinology

## 2018-02-08 ENCOUNTER — Ambulatory Visit: Payer: Medicare Other | Admitting: "Endocrinology

## 2018-02-08 VITALS — BP 151/92 | HR 64 | Ht 60.0 in | Wt 224.0 lb

## 2018-02-08 DIAGNOSIS — E039 Hypothyroidism, unspecified: Secondary | ICD-10-CM

## 2018-02-08 DIAGNOSIS — I10 Essential (primary) hypertension: Secondary | ICD-10-CM

## 2018-02-08 DIAGNOSIS — E782 Mixed hyperlipidemia: Secondary | ICD-10-CM | POA: Diagnosis not present

## 2018-02-08 DIAGNOSIS — E1159 Type 2 diabetes mellitus with other circulatory complications: Secondary | ICD-10-CM | POA: Diagnosis not present

## 2018-02-08 MED ORDER — INSULIN LISPRO 200 UNIT/ML ~~LOC~~ SOPN: 15.0000 [IU] | PEN_INJECTOR | Freq: Three times a day (TID) | SUBCUTANEOUS | 2 refills | Status: DC

## 2018-02-08 MED ORDER — INSULIN GLARGINE 300 UNIT/ML ~~LOC~~ SOPN: 50.0000 [IU] | PEN_INJECTOR | Freq: Every day | SUBCUTANEOUS | 2 refills | Status: DC

## 2018-02-08 NOTE — Progress Notes (Signed)
Endocrinology Consult Note       02/08/2018, 1:19 PM   Subjective:    Patient ID: Kimberly [name], female    DOB: 07-31-1956.  Kimberly [name] is being seen in consultation for management of currently uncontrolled symptomatic diabetes requested by  Ignatius Specking, MD.   Past Medical History:  Diagnosis Date  . Anemia    PMH: as a child  . Arthritis   . Asthma   . CAD (coronary artery disease)    nonobstructive by cath, 6/08 (false positive Cardiolite) normal stress echo, 7/11  . CHF (congestive heart failure) (HCC)   . Chronic back pain   . Chronic bronchitis (HCC)   . Complication of anesthesia    had an asthma attack when woke up from procedure  . Complication of anesthesia    asthma attack with one surgery  . COPD (chronic obstructive pulmonary disease) (HCC)   . Degenerative joint disease   . Depression   . Diabetes mellitus without complication (HCC)   . DJD (degenerative joint disease)   . Fatty liver   . Fibromyalgia   . GERD (gastroesophageal reflux disease)   . Glaucoma   . Headache(784.0)   . Heart failure, diastolic, chronic (HCC)   . Heart murmur    PMH:As a child only  . Hernia of abdominal cavity    "upper and lower hernia"  . History of hiatal hernia   . HTN (hypertension)   . Hypertension   . Hypothyroidism   . IBS (irritable bowel syndrome)   . IDDM (insulin dependent diabetes mellitus) (HCC)   . Morbid obesity (HCC)   . Neuropathy    associated with diabetes  . Obstructive sleep apnea   . Pancreatitis   . Pinched nerve in neck   . Pneumonia    as a child  . Pulmonary hypertension (HCC)   . Rheumatic fever    PMH: as a child  . Seizures (HCC)    PMH: only as a child  . Seizures (HCC)    in childhood  . Shortness of breath   . Sleep apnea   . Spinal stenosis   . Stroke [facility])    12/09/2013   Past Surgical History:  Procedure Laterality Date  .  ABDOMINAL HYSTERECTOMY    . ABDOMINAL HYSTERECTOMY     partial hysterectomy  . APPENDECTOMY    . BACK SURGERY    . BACK SURGERY     disc surgery with complications and damage to right side  . BILATERAL KNEE ARTHROSCOPY  2000, 2009  . CARDIAC CATHETERIZATION     2009  . CATARACT EXTRACTION W/ INTRAOCULAR LENS  IMPLANT, BILATERAL    . CHOLECYSTECTOMY  1994  . CHOLECYSTECTOMY    . COLONOSCOPY N/A 08/01/2013   Procedure: COLONOSCOPY;  Surgeon: Malissa Hippo, MD;  Location: [facility];  Service: Endoscopy;  Laterality: N/A;  930  . COLONOSCOPY N/A 08/03/2017   Procedure: COLONOSCOPY;  Surgeon: Malissa Hippo, MD;  Location: [facility];  Service: Endoscopy;  Laterality: N/A;  12:00  . CYST EXCISION     Left breast  .  EXCISIONAL HEMORRHOIDECTOMY    . EYE SURGERY  1967  . EYE SURGERY     bilateral cataract removal with lens implants  . EYE SURGERY     growth removed from right eye lid  . EYE SURGERY     bilateral eye surgery age 89 t"to straighten eyes'  . heel (other)  2005  . HEEL SPUR SURGERY     Right heel - growth removal and rebuilding of heel  . HEMORRHOID SURGERY    . HERNIA REPAIR    . KNEE ARTHROSCOPY Bilateral   . LUMBAR LAMINECTOMY/DECOMPRESSION MICRODISCECTOMY Right 08/23/2014   Procedure: LUMBAR LAMINECTOMY/DECOMPRESSION MICRODISCECTOMY 1 LEVEL;  Surgeon: Temple PaciniHenry A Pool, MD;  Location: [facility];  Service: Neurosurgery;  Laterality: Right;  LUMBAR LAMINECTOMY/DECOMPRESSION MICRODISCECTOMY 1 LEVEL LUMBAR 2-3  . ovaries removed  2003  . PARTIAL HYSTERECTOMY  1981  . SPHINCTEROTOMY    . spinahatomy  1992  . TUBAL LIGATION  1975  . TUBAL LIGATION     Social History   Socioeconomic History  . Marital status: Married    Spouse name: None  . Number of children: None  . Years of education: None  . Highest education level: None  Social Needs  . Financial resource strain: None  . Food insecurity - worry: None  . Food insecurity - inability: None  .  Transportation needs - medical: None  . Transportation needs - non-medical: None  Occupational History  . None  Tobacco Use  . Smoking status: Never Smoker  . Smokeless tobacco: Never Used  Substance and Sexual Activity  . Alcohol use: No  . Drug use: No  . Sexual activity: None  Other Topics Concern  . None  Social History Narrative   ** Merged History Encounter **       Regularly exercises. Full Time.    Outpatient Encounter Medications as of 02/08/2018  Medication Sig  . amLODipine (NORVASC) 2.5 MG tablet Take 2.5 mg by mouth daily.   . carvedilol (COREG) 12.5 MG tablet Take 6.25 mg by mouth 2 (two) times daily with a meal.   . DULoxetine (CYMBALTA) 30 MG capsule Take 30 mg by mouth daily.  . famotidine (PEPCID) 40 MG tablet Take 40 mg by mouth every morning.  . furosemide (LASIX) 20 MG tablet Take 20 mg by mouth daily.   . Insulin Lispro (HUMALOG KWIKPEN) 200 UNIT/ML SOPN Inject 15-21 Units into the skin 3 (three) times daily before meals. Follow sliding scale given by your PCP  . levothyroxine (SYNTHROID, LEVOTHROID) 75 MCG tablet Take 75 mcg by mouth daily before breakfast.  . potassium chloride (K-DUR) 10 MEQ tablet Take 5 mEq by mouth 2 (two) times daily.   . traMADol (ULTRAM) 50 MG tablet Take 50 mg by mouth every 6 (six) hours as needed for moderate pain.   . [DISCONTINUED] insulin glargine (LANTUS) 100 UNIT/ML injection Inject 0.1 mLs (10 Units total) into the skin at bedtime. (Patient taking differently: Inject 35 Units into the skin at bedtime. )  . [DISCONTINUED] Insulin Lispro (HUMALOG KWIKPEN) 200 UNIT/ML SOPN Inject 5 Units into the skin 3 (three) times daily before meals. Follow sliding scale given by your PCP (Patient taking differently: Inject 22 Units into the skin 3 (three) times daily before meals. Follow sliding scale given by your PCP)  . acetaminophen (TYLENOL) 325 MG tablet Take 2 tablets (650 mg total) by mouth every 6 (six) hours as needed for mild pain (or  Fever >/= 101).  Marland Kitchen. albuterol (  PROVENTIL) (2.5 MG/3ML) 0.083% nebulizer solution Inhale 2.5 mg into the lungs every 6 (six) hours as needed for wheezing or shortness of breath.   Marland Kitchen aspirin EC 81 MG tablet Take 81 mg by mouth daily.  . Insulin Glargine (TOUJEO SOLOSTAR) 300 UNIT/ML SOPN Inject 50 Units into the skin at bedtime.  Marland Kitchen lisinopril (PRINIVIL,ZESTRIL) 20 MG tablet Take 20 mg by mouth daily.   . [DISCONTINUED] atorvastatin (LIPITOR) 10 MG tablet Take 10 mg by mouth every morning.   . [DISCONTINUED] betamethasone dipropionate 0.05 % lotion Apply 1 application topically daily.  . [DISCONTINUED] diclofenac sodium (VOLTAREN) 1 % GEL APPLY 4 GRAMS TO KNEE JOINTS FOUR TIMES DAILY  . [DISCONTINUED] fluticasone furoate-vilanterol (BREO ELLIPTA) 200-25 MCG/INH AEPB Inhale 1 puff into the lungs daily.  . [DISCONTINUED] metoCLOPramide (REGLAN) 5 MG tablet Take 5 mg by mouth 4 (four) times daily as needed for nausea or vomiting.   . [DISCONTINUED] OXYGEN Inhale 2.5 L into the lungs at bedtime.  . [DISCONTINUED] polyethylene glycol (MIRALAX / GLYCOLAX) packet Take 17 g by mouth daily as needed for mild constipation.   No facility-administered encounter medications on file as of 02/08/2018.     ALLERGIES: Allergies  Allergen Reactions  . Hydrocodone Other (See Comments)    sedation  . Naproxen Anaphylaxis and Other (See Comments)    REACTION: throat swelling  . Other Shortness Of Breath and Other (See Comments)    Local anesthesia Had asthma attack  . Other Shortness Of Breath and Other (See Comments)    Pt states a sedative agent used prior to a surgical procedure ( Pt states it "sounds like Phenergan. It should be in my records from [facility].") caused an asthma attack.   . Oxycodone Other (See Comments)    sedation  . Sulfonamide Derivatives Other (See Comments)    Yeast infection  . Cinnamon Other (See Comments)    Unknown  . Feldene [Piroxicam] Nausea Only  . Lyrica [Pregabalin] Other  (See Comments)    Unknown  . Phenergan [Promethazine Hcl] Other (See Comments)    triggers asthma attacks  . Sulfa Antibiotics Other (See Comments)    (Yeast infection, per patient.)  . Latex Rash  . Latex Rash and Other (See Comments)    "Blistery" rash.   . Tape Rash and Other (See Comments)    Do not use adhesive tape; okay to use paper tape.    VACCINATION STATUS: Immunization History  Administered Date(s) Administered  . Influenza,inj,Quad PF,6+ Mos 08/26/2014  . Pneumococcal Polysaccharide-23 08/26/2014    Diabetes  She presents for her initial diabetic visit. She has type 2 diabetes mellitus. Onset time: She was diagnosed at approximate age of 51 years. Her disease course has been worsening. There are no hypoglycemic associated symptoms. Pertinent negatives for hypoglycemia include no confusion, headaches, pallor or seizures. Associated symptoms include blurred vision, fatigue, polydipsia and polyuria. Pertinent negatives for diabetes include no chest pain and no polyphagia. There are no hypoglycemic complications. Symptoms are worsening. Diabetic complications include a CVA and heart disease. Risk factors for coronary artery disease include dyslipidemia, diabetes mellitus, family history, obesity, hypertension, post-menopausal and sedentary lifestyle. Current diabetic treatments: She is on Lantus 35 units nightly and Humalog 22 units 3 times daily AC. Her weight is increasing steadily. She is following a generally unhealthy diet. When asked about meal planning, she reported none. She has not had a previous visit with a dietitian. She never participates in exercise. Her home blood glucose trend is increasing  steadily. Her overall blood glucose range is >200 mg/dl. (She brought a log showing persistently above target blood glucose readings greater than 200 mg/dL.) An ACE inhibitor/angiotensin II receptor blocker is being taken. Eye exam is current.  Hyperlipidemia  This is a chronic  problem. The current episode started more than 1 year ago. The problem is controlled. Exacerbating diseases include diabetes, hypothyroidism and obesity. Pertinent negatives include no chest pain, myalgias or shortness of breath. Current antihyperlipidemic treatment includes statins. Risk factors for coronary artery disease include family history, dyslipidemia, diabetes mellitus, hypertension, obesity, post-menopausal and a sedentary lifestyle.  Hypertension  This is a chronic problem. The current episode started more than 1 year ago. Associated symptoms include blurred vision. Pertinent negatives include no chest pain, headaches, palpitations or shortness of breath. Risk factors for coronary artery disease include dyslipidemia, diabetes mellitus, obesity, sedentary lifestyle and post-menopausal state. Past treatments include calcium channel blockers and ACE inhibitors. Hypertensive end-organ damage includes CVA.    Review of Systems  Constitutional: Positive for fatigue. Negative for chills, fever and unexpected weight change.  HENT: Negative for trouble swallowing and voice change.   Eyes: Positive for blurred vision. Negative for visual disturbance.  Respiratory: Negative for cough, shortness of breath and wheezing.   Cardiovascular: Negative for chest pain, palpitations and leg swelling.  Gastrointestinal: Negative for diarrhea, nausea and vomiting.  Endocrine: Positive for polydipsia and polyuria. Negative for cold intolerance, heat intolerance and polyphagia.  Musculoskeletal: Positive for gait problem. Negative for arthralgias and myalgias.       She walks with a cane related to her hemiparesis from stroke on the right side.  Skin: Negative for color change, pallor, rash and wound.  Neurological: Negative for seizures and headaches.  Psychiatric/Behavioral: Negative for confusion and suicidal ideas.    Objective:    BP (!) 151/92   Pulse 64   Ht 5' (1.524 m)   Wt 224 lb (101.6 kg)    BMI 43.75 kg/m   Wt Readings from Last 3 Encounters:  02/08/18 224 lb (101.6 kg)  12/19/17 219 lb 6.4 oz (99.5 kg)  08/03/17 216 lb (98 kg)     Physical Exam  Constitutional: She is oriented to person, place, and time. She appears well-developed.  HENT:  Head: Normocephalic and atraumatic.  Eyes: EOM are normal.  Neck: Normal range of motion. Neck supple. No tracheal deviation present. No thyromegaly present.  Cardiovascular: Normal rate and regular rhythm.  Pulmonary/Chest: Effort normal and breath sounds normal.  Abdominal: Soft. Bowel sounds are normal. There is no tenderness. There is no guarding.  Musculoskeletal: Normal range of motion. She exhibits no edema.  Neurological: She is alert and oriented to person, place, and time. She has normal reflexes. No cranial nerve deficit. Coordination normal.  Skin: Skin is warm and dry. No rash noted. No erythema. No pallor.  Psychiatric: She has a normal mood and affect. Judgment normal.      CMP ( most recent) CMP     Component Value Date/Time   NA 141 05/27/2017 1049   K 3.5 05/27/2017 1049   CL 105 05/27/2017 1049   CO2 25 05/27/2017 1049   GLUCOSE 134 (H) 05/27/2017 1049   BUN 9 05/27/2017 1049   CREATININE 0.83 05/27/2017 1049   CALCIUM 8.6 05/27/2017 1049   PROT 7.0 12/19/2017 1541   ALBUMIN 3.6 05/27/2017 1049   AST 27 12/19/2017 1541   ALT 13 12/19/2017 1541   ALKPHOS 76 05/27/2017 1049   BILITOT 1.9 (H)  12/19/2017 1541   GFRNONAA >60 04/08/2017 0644   GFRNONAA 69 09/29/2016 1126   GFRAA >60 04/08/2017 0644   GFRAA 80 09/29/2016 1126     Lipid Panel ( most recent) Lipid Panel     Component Value Date/Time   CHOL 138 04/07/2017 0603   TRIG 139 04/07/2017 0603   HDL 26 (L) 04/07/2017 0603   CHOLHDL 5.3 04/07/2017 0603   VLDL 28 04/07/2017 0603   LDLCALC 84 04/07/2017 0603      Lab Results  Component Value Date   TSH 2.54 09/29/2016    A1c was 11.2% on December 20, 2017.  Assessment & Plan:   1.  DM type 2 causing vascular disease (HCC)  - Kimberly [name] has currently uncontrolled symptomatic type 2 DM since 62 years of age,  with most recent A1c of 11.2 %. Recent labs reviewed.  -her diabetes is complicated by CVA with right sided hemiparesis, obesity/sedentary life , congestive heart failure and Kimberly [name] remains at a high risk for more acute and chronic complications which include CAD, CVA, CKD, retinopathy, and neuropathy. These are all discussed in detail with the patient.  - I have counseled her on diet management and weight loss, by adopting a carbohydrate restricted/protein rich diet.  - Suggestion is made for her to avoid simple carbohydrates  from her diet including Cakes, Sweet Desserts, Ice Cream, Soda (diet and regular), Sweet Tea, Candies, Chips, Cookies, Store Bought Juices, Alcohol in Excess of  1-2 drinks a day, Artificial Sweeteners, and "Sugar-free" Products. This will help patient to have stable blood glucose profile and potentially avoid unintended weight gain.  - I encouraged her to switch to  unprocessed or minimally processed complex starch and increased protein intake (animal or plant source), fruits, and vegetables.  - she is advised to stick to a routine mealtimes to eat 3 meals  a day and avoid unnecessary snacks ( to snack only to correct hypoglycemia).   - she will be scheduled with Norm Salt, RDN, CDE for individualized diabetes education.  - I have approached her with the following individualized plan to manage diabetes and patient agrees:   -Given her current glycemic burden with A1c of 11.2%, she will continue to require intensive treatment with basal/bolus insulin.  -I will proceed to increase her basal insulin Lantus to 50 units nightly, decrease her prandial insulin Humalog to 15 units 3 times a day with meals  for pre-meal BG readings of 90-150mg /dl, plus patient specific correction dose for unexpected hyperglycemia above 150mg /dl,  associated with strict monitoring of glucose 4 times a day-before meals and at bedtime. - Patient is warned not to take insulin without proper monitoring per orders. -Adjustment parameters are given for hypo and hyperglycemia in writing. -Patient is encouraged to call clinic for blood glucose levels less than 70 or above 300 mg /dl. -She reports intolerance to Victoza and metformin.   - Patient specific target  A1c;  LDL, HDL, Triglycerides, and  Waist Circumference were discussed in detail.  2) BP/HTN: Her blood pressure is controlled to target.  She is advised to continue on lisinopril and amlodipine. 3) Lipids/HPL:   Her recent lipid panel shows controlled LDL at 84.   Patient is not on statins.  She reports multiple allergies to medications.   4)  Weight/Diet: CDE Consult will be initiated , exercise, and detailed carbohydrates information provided.   5.  Hypothyroidism: Etiology unclear, patient denies any history of thyroidectomy nor radioactive iodine thyroid ablation. -  She is currently on levothyroxine 75 mcg p.o. nightly.  - We discussed about correct intake of levothyroxine, at fasting, with water, separated by at least 30 minutes from breakfast, and separated by more than 4 hours from calcium, iron, multivitamins, acid reflux medications (PPIs). -Patient is made aware of the fact that thyroid hormone replacement is needed for life, dose to be adjusted by periodic monitoring of thyroid function tests.   6) Chronic Care/Health Maintenance:  -she  is on lisinopril and  is encouraged to continue to follow up with Ophthalmology, Dentist,  Podiatrist at least yearly or according to recommendations, and advised to  stay away from smoking. I have recommended yearly flu vaccine and pneumonia vaccination at least every 5 years; moderate intensity exercise for up to 150 minutes weekly; and  sleep for at least 7 hours a day.  - I advised patient to maintain close follow up with Ignatius Specking,  MD for primary care needs.  - Time spent with the patient: 45 minutes, of which >50% was spent in obtaining information about her symptoms, reviewing her previous labs, evaluations, and treatments, counseling her about her currently uncontrolled type 2 diabetes which is complicated by CVA, hypothyroidism, hyperlipidemia, hypertension, and developing a plan to confirm the diagnosis and long term treatment as necessary.  Kimberly [name] participated in the discussions, expressed understanding, and voiced agreement with the above plans.  All questions were answered to her satisfaction. she is encouraged to contact clinic should she have any questions or concerns prior to her return visit.  Follow up plan: - Return in about 1 week (around 02/15/2018) for follow up with meter and logs- no labs.  Marquis Lunch, MD [facility] [address] [place], [place] [address] Phone: [phone]  Fax: [phone]    02/08/2018, 1:19 PM  This note was partially dictated with voice recognition software. Similar sounding words can be transcribed inadequately or may not  be corrected upon review.

## 2018-02-08 NOTE — Patient Instructions (Signed)

## 2018-02-14 ENCOUNTER — Encounter: Payer: Self-pay | Admitting: "Endocrinology

## 2018-02-14 ENCOUNTER — Ambulatory Visit (INDEPENDENT_AMBULATORY_CARE_PROVIDER_SITE_OTHER): Payer: Medicare Other | Admitting: "Endocrinology

## 2018-02-14 VITALS — BP 134/70 | HR 65 | Ht 60.0 in | Wt 225.0 lb

## 2018-02-14 DIAGNOSIS — E1159 Type 2 diabetes mellitus with other circulatory complications: Secondary | ICD-10-CM

## 2018-02-14 DIAGNOSIS — E039 Hypothyroidism, unspecified: Secondary | ICD-10-CM | POA: Diagnosis not present

## 2018-02-14 DIAGNOSIS — I10 Essential (primary) hypertension: Secondary | ICD-10-CM

## 2018-02-14 DIAGNOSIS — E782 Mixed hyperlipidemia: Secondary | ICD-10-CM | POA: Diagnosis not present

## 2018-02-14 MED ORDER — INSULIN LISPRO 200 UNIT/ML ~~LOC~~ SOPN: 18.0000 [IU] | PEN_INJECTOR | Freq: Three times a day (TID) | SUBCUTANEOUS | 2 refills | Status: DC

## 2018-02-14 MED ORDER — FREESTYLE LIBRE 14 DAY SENSOR MISC: 1.0000 | 2 refills | Status: DC

## 2018-02-14 MED ORDER — FREESTYLE LIBRE 14 DAY READER DEVI: 1.0000 | Freq: Once | 0 refills | Status: AC

## 2018-02-14 NOTE — Progress Notes (Signed)
Endocrinology Consult Note       02/14/2018, 1:06 PM   Subjective:    Patient ID: Kimberly [name], female    DOB: Mar 28, 1956.  Kimberly [name] is being seen in consultation for management of currently uncontrolled symptomatic diabetes requested by  Ignatius SpeckingVyas, Dhruv B, MD.   Past Medical History:  Diagnosis Date  . Anemia    PMH: as a child  . Arthritis   . Asthma   . CAD (coronary artery disease)    nonobstructive by cath, 6/08 (false positive Cardiolite) normal stress echo, 7/11  . CHF (congestive heart failure) (HCC)   . Chronic back pain   . Chronic bronchitis (HCC)   . Complication of anesthesia    had an asthma attack when woke up from procedure  . Complication of anesthesia    asthma attack with one surgery  . COPD (chronic obstructive pulmonary disease) (HCC)   . Degenerative joint disease   . Depression   . Diabetes mellitus without complication (HCC)   . DJD (degenerative joint disease)   . Fatty liver   . Fibromyalgia   . GERD (gastroesophageal reflux disease)   . Glaucoma   . Headache(784.0)   . Heart failure, diastolic, chronic (HCC)   . Heart murmur    PMH:As a child only  . Hernia of abdominal cavity    "upper and lower hernia"  . History of hiatal hernia   . HTN (hypertension)   . Hypertension   . Hypothyroidism   . IBS (irritable bowel syndrome)   . IDDM (insulin dependent diabetes mellitus) (HCC)   . Morbid obesity (HCC)   . Neuropathy    associated with diabetes  . Obstructive sleep apnea   . Pancreatitis   . Pinched nerve in neck   . Pneumonia    as a child  . Pulmonary hypertension (HCC)   . Rheumatic fever    PMH: as a child  . Seizures (HCC)    PMH: only as a child  . Seizures (HCC)    in childhood  . Shortness of breath   . Sleep apnea   . Spinal stenosis   . Stroke [facility](HCC)    12/09/2013   Past Surgical History:  Procedure Laterality Date  .  ABDOMINAL HYSTERECTOMY    . ABDOMINAL HYSTERECTOMY     partial hysterectomy  . APPENDECTOMY    . BACK SURGERY    . BACK SURGERY     disc surgery with complications and damage to right side  . BILATERAL KNEE ARTHROSCOPY  2000, 2009  . CARDIAC CATHETERIZATION     2009  . CATARACT EXTRACTION W/ INTRAOCULAR LENS  IMPLANT, BILATERAL    . CHOLECYSTECTOMY  1994  . CHOLECYSTECTOMY    . COLONOSCOPY N/A 08/01/2013   Procedure: COLONOSCOPY;  Surgeon: Malissa HippoNajeeb U Rehman, MD;  Location: [facility];  Service: Endoscopy;  Laterality: N/A;  930  . COLONOSCOPY N/A 08/03/2017   Procedure: COLONOSCOPY;  Surgeon: Malissa Hippoehman, Najeeb U, MD;  Location: [facility];  Service: Endoscopy;  Laterality: N/A;  12:00  . CYST EXCISION     Left breast  .  EXCISIONAL HEMORRHOIDECTOMY    . EYE SURGERY  1967  . EYE SURGERY     bilateral cataract removal with lens implants  . EYE SURGERY     growth removed from right eye lid  . EYE SURGERY     bilateral eye surgery age 23 t"to straighten eyes'  . heel (other)  2005  . HEEL SPUR SURGERY     Right heel - growth removal and rebuilding of heel  . HEMORRHOID SURGERY    . HERNIA REPAIR    . KNEE ARTHROSCOPY Bilateral   . LUMBAR LAMINECTOMY/DECOMPRESSION MICRODISCECTOMY Right 08/23/2014   Procedure: LUMBAR LAMINECTOMY/DECOMPRESSION MICRODISCECTOMY 1 LEVEL;  Surgeon: Temple Pacini, MD;  Location: [facility];  Service: Neurosurgery;  Laterality: Right;  LUMBAR LAMINECTOMY/DECOMPRESSION MICRODISCECTOMY 1 LEVEL LUMBAR 2-3  . ovaries removed  2003  . PARTIAL HYSTERECTOMY  1981  . SPHINCTEROTOMY    . spinahatomy  1992  . TUBAL LIGATION  1975  . TUBAL LIGATION     Social History   Socioeconomic History  . Marital status: Married    Spouse name: Not on file  . Number of children: Not on file  . Years of education: Not on file  . Highest education level: Not on file  Occupational History  . Not on file  Social Needs  . Financial resource strain: Not on file  . Food  insecurity:    Worry: Not on file    Inability: Not on file  . Transportation needs:    Medical: Not on file    Non-medical: Not on file  Tobacco Use  . Smoking status: Never Smoker  . Smokeless tobacco: Never Used  Substance and Sexual Activity  . Alcohol use: No  . Drug use: No  . Sexual activity: Not on file  Lifestyle  . Physical activity:    Days per week: Not on file    Minutes per session: Not on file  . Stress: Not on file  Relationships  . Social connections:    Talks on phone: Not on file    Gets together: Not on file    Attends religious service: Not on file    Active member of club or organization: Not on file    Attends meetings of clubs or organizations: Not on file    Relationship status: Not on file  Other Topics Concern  . Not on file  Social History Narrative   ** Merged History Encounter **       Regularly exercises. Full Time.    Outpatient Encounter Medications as of 02/14/2018  Medication Sig  . acetaminophen (TYLENOL) 325 MG tablet Take 2 tablets (650 mg total) by mouth every 6 (six) hours as needed for mild pain (or Fever >/= 101).  Marland Kitchen albuterol (PROVENTIL) (2.5 MG/3ML) 0.083% nebulizer solution Inhale 2.5 mg into the lungs every 6 (six) hours as needed for wheezing or shortness of breath.   Marland Kitchen amLODipine (NORVASC) 2.5 MG tablet Take 2.5 mg by mouth daily.   Marland Kitchen aspirin EC 81 MG tablet Take 81 mg by mouth daily.  . carvedilol (COREG) 12.5 MG tablet Take 6.25 mg by mouth 2 (two) times daily with a meal.   . Continuous Blood Gluc Receiver (FREESTYLE LIBRE 14 DAY READER) DEVI 1 each by Does not apply route once for 1 dose.  . Continuous Blood Gluc Sensor (FREESTYLE LIBRE 14 DAY SENSOR) MISC Inject 1 each into the skin every 14 (fourteen) days. Use as directed.  . DULoxetine (CYMBALTA) 30  MG capsule Take 30 mg by mouth daily.  . famotidine (PEPCID) 40 MG tablet Take 40 mg by mouth every morning.  . furosemide (LASIX) 20 MG tablet Take 20 mg by mouth daily.    . Insulin Glargine (TOUJEO SOLOSTAR) 300 UNIT/ML SOPN Inject 50 Units into the skin at bedtime.  . Insulin Lispro (HUMALOG KWIKPEN) 200 UNIT/ML SOPN Inject 18-24 Units into the skin 3 (three) times daily before meals. Follow sliding scale given by your PCP  . levothyroxine (SYNTHROID, LEVOTHROID) 75 MCG tablet Take 75 mcg by mouth daily before breakfast.  . lisinopril (PRINIVIL,ZESTRIL) 20 MG tablet Take 20 mg by mouth daily.   . potassium chloride (K-DUR) 10 MEQ tablet Take 5 mEq by mouth 2 (two) times daily.   . traMADol (ULTRAM) 50 MG tablet Take 50 mg by mouth every 6 (six) hours as needed for moderate pain.   . [DISCONTINUED] Insulin Lispro (HUMALOG KWIKPEN) 200 UNIT/ML SOPN Inject 15-21 Units into the skin 3 (three) times daily before meals. Follow sliding scale given by your PCP   No facility-administered encounter medications on file as of 02/14/2018.     ALLERGIES: Allergies  Allergen Reactions  . Hydrocodone Other (See Comments)    sedation  . Naproxen Anaphylaxis and Other (See Comments)    REACTION: throat swelling  . Other Shortness Of Breath and Other (See Comments)    Local anesthesia Had asthma attack  . Other Shortness Of Breath and Other (See Comments)    Pt states a sedative agent used prior to a surgical procedure ( Pt states it "sounds like Phenergan. It should be in my records from [place].") caused an asthma attack.   . Oxycodone Other (See Comments)    sedation  . Sulfonamide Derivatives Other (See Comments)    Yeast infection  . Cinnamon Other (See Comments)    Unknown  . Feldene [Piroxicam] Nausea Only  . Lyrica [Pregabalin] Other (See Comments)    Unknown  . Phenergan [Promethazine Hcl] Other (See Comments)    triggers asthma attacks  . Sulfa Antibiotics Other (See Comments)    (Yeast infection, per patient.)  . Latex Rash  . Latex Rash and Other (See Comments)    "Blistery" rash.   . Tape Rash and Other (See Comments)    Do not use adhesive tape;  okay to use paper tape.    VACCINATION STATUS: Immunization History  Administered Date(s) Administered  . Influenza,inj,Quad PF,6+ Mos 08/26/2014  . Pneumococcal Polysaccharide-23 08/26/2014    Diabetes  She presents for her follow-up diabetic visit. She has type 2 diabetes mellitus. Onset time: She was diagnosed at approximate age of 51 years. Her disease course has been worsening. There are no hypoglycemic associated symptoms. Pertinent negatives for hypoglycemia include no confusion, headaches, pallor or seizures. Associated symptoms include blurred vision, fatigue, polydipsia and polyuria. Pertinent negatives for diabetes include no chest pain and no polyphagia. There are no hypoglycemic complications. Symptoms are improving. Diabetic complications include a CVA and heart disease. Risk factors for coronary artery disease include dyslipidemia, diabetes mellitus, family history, obesity, hypertension, post-menopausal and sedentary lifestyle. Current diabetic treatments: She is on Lantus 35 units nightly and Humalog 22 units 3 times daily AC. Her weight is stable. She is following a generally unhealthy diet. When asked about meal planning, she reported none. She has not had a previous visit with a dietitian. She never participates in exercise. Her home blood glucose trend is increasing steadily. Her breakfast blood glucose range is generally 140-180  mg/dl. Her lunch blood glucose range is generally >200 mg/dl. Her dinner blood glucose range is generally >200 mg/dl. Her bedtime blood glucose range is generally >200 mg/dl. Her overall blood glucose range is >200 mg/dl. (She brought a log showing persistently above target blood glucose readings greater than 200 mg/dL.) An ACE inhibitor/angiotensin II receptor blocker is being taken. Eye exam is current.  Hyperlipidemia  This is a chronic problem. The current episode started more than 1 year ago. The problem is controlled. Exacerbating diseases include  diabetes, hypothyroidism and obesity. Pertinent negatives include no chest pain, myalgias or shortness of breath. Current antihyperlipidemic treatment includes statins. Risk factors for coronary artery disease include family history, dyslipidemia, diabetes mellitus, hypertension, obesity, post-menopausal and a sedentary lifestyle.  Hypertension  This is a chronic problem. The current episode started more than 1 year ago. Associated symptoms include blurred vision. Pertinent negatives include no chest pain, headaches, palpitations or shortness of breath. Risk factors for coronary artery disease include dyslipidemia, diabetes mellitus, obesity, sedentary lifestyle and post-menopausal state. Past treatments include calcium channel blockers and ACE inhibitors. Hypertensive end-organ damage includes CVA.    Review of Systems  Constitutional: Positive for fatigue. Negative for chills, fever and unexpected weight change.  HENT: Negative for trouble swallowing and voice change.   Eyes: Positive for blurred vision. Negative for visual disturbance.  Respiratory: Negative for cough, shortness of breath and wheezing.   Cardiovascular: Negative for chest pain, palpitations and leg swelling.  Gastrointestinal: Negative for diarrhea, nausea and vomiting.  Endocrine: Positive for polydipsia and polyuria. Negative for cold intolerance, heat intolerance and polyphagia.  Musculoskeletal: Positive for gait problem. Negative for arthralgias and myalgias.       She walks with a cane related to her hemiparesis from stroke on the right side.  Skin: Negative for color change, pallor, rash and wound.  Neurological: Negative for seizures and headaches.  Psychiatric/Behavioral: Negative for confusion and suicidal ideas.    Objective:    BP 134/70   Pulse 65   Ht 5' (1.524 m)   Wt 225 lb (102.1 kg)   BMI 43.94 kg/m   Wt Readings from Last 3 Encounters:  02/14/18 225 lb (102.1 kg)  02/08/18 224 lb (101.6 kg)   12/19/17 219 lb 6.4 oz (99.5 kg)     Physical Exam  Constitutional: She is oriented to person, place, and time. She appears well-developed.  HENT:  Head: Normocephalic and atraumatic.  Eyes: EOM are normal.  Neck: Normal range of motion. Neck supple. No tracheal deviation present. No thyromegaly present.  Cardiovascular: Normal rate and regular rhythm.  Pulmonary/Chest: Effort normal and breath sounds normal.  Abdominal: Soft. Bowel sounds are normal. There is no tenderness. There is no guarding.  Musculoskeletal: Normal range of motion. She exhibits no edema.  Neurological: She is alert and oriented to person, place, and time. She has normal reflexes. No cranial nerve deficit. Coordination normal.  Skin: Skin is warm and dry. No rash noted. No erythema. No pallor.  Psychiatric: She has a normal mood and affect. Judgment normal.    CMP     Component Value Date/Time   NA 141 05/27/2017 1049   K 3.5 05/27/2017 1049   CL 105 05/27/2017 1049   CO2 25 05/27/2017 1049   GLUCOSE 134 (H) 05/27/2017 1049   BUN 9 05/27/2017 1049   CREATININE 0.83 05/27/2017 1049   CALCIUM 8.6 05/27/2017 1049   PROT 7.0 12/19/2017 1541   ALBUMIN 3.6 05/27/2017 1049   AST  27 12/19/2017 1541   ALT 13 12/19/2017 1541   ALKPHOS 76 05/27/2017 1049   BILITOT 1.9 (H) 12/19/2017 1541   GFRNONAA >60 04/08/2017 0644   GFRNONAA 69 09/29/2016 1126   GFRAA >60 04/08/2017 0644   GFRAA 80 09/29/2016 1126    Lipid Panel     Component Value Date/Time   CHOL 138 04/07/2017 0603   TRIG 139 04/07/2017 0603   HDL 26 (L) 04/07/2017 0603   CHOLHDL 5.3 04/07/2017 0603   VLDL 28 04/07/2017 0603   LDLCALC 84 04/07/2017 0603      Lab Results  Component Value Date   TSH 2.54 09/29/2016    A1c was 11.2% on December 20, 2017.  Assessment & Plan:   1. DM type 2 causing vascular disease (HCC)  - Kimberly [name] has currently uncontrolled symptomatic type 2 DM since 62 years of age. -He came with controlled  fasting but still persistently above target postprandial glycemic profile.  Her recent A1c was 11.2%. -  Recent labs reviewed.  -her diabetes is complicated by CVA with right sided hemiparesis, obesity/sedentary life , congestive heart failure and Kimberly [name] remains at a high risk for more acute and chronic complications which include CAD, CVA, CKD, retinopathy, and neuropathy. These are all discussed in detail with the patient.  - I have counseled her on diet management and weight loss, by adopting a carbohydrate restricted/protein rich diet.  -  Suggestion is made for her to avoid simple carbohydrates  from her diet including Cakes, Sweet Desserts / Pastries, Ice Cream, Soda (diet and regular), Sweet Tea, Candies, Chips, Cookies, Store Bought Juices, Alcohol in Excess of  1-2 drinks a day, Artificial Sweeteners, and "Sugar-free" Products. This will help patient to have stable blood glucose profile and potentially avoid unintended weight gain.  - I encouraged her to switch to  unprocessed or minimally processed complex starch and increased protein intake (animal or plant source), fruits, and vegetables.  - she is advised to stick to a routine mealtimes to eat 3 meals  a day and avoid unnecessary snacks ( to snack only to correct hypoglycemia).   - she will be scheduled with Norm Salt, RDN, CDE for individualized diabetes education.  - I have approached her with the following individualized plan to manage diabetes and patient agrees:   -Given her current glycemic burden with A1c of 11.2%, she will require intensive treatment with basal/bolus insulin.  -She will not tolerate a larger dose of basal insulin.   -I advised her to continue Lantus 50 units nightly, discussed and increased Humalog to 18  units 3 times a day with meals  for pre-meal BG readings of 90-150mg /dl, plus patient specific correction dose for unexpected hyperglycemia above 150mg /dl, associated with strict monitoring of  glucose 4 times a day-before meals and at bedtime. - Patient is warned not to take insulin without proper monitoring per orders. -Adjustment parameters are given for hypo and hyperglycemia in writing. -Patient is encouraged to call clinic for blood glucose levels less than 70 or above 300 mg /dl. -She reports intolerance to Victoza and metformin.   - Patient specific target  A1c;  LDL, HDL, Triglycerides, and  Waist Circumference were discussed in detail.  2) BP/HTN: Her blood pressure is controlled to target.  She is advised to continue on lisinopril and amlodipine. 3) Lipids/HPL: Her recent lipid panel showed controlled LDL at 84.  Patient is not on statins.  She reports multiple allergies to medications.  4)  Weight/Diet: CDE Consult will be initiated , exercise, and detailed carbohydrates information provided.   5.  Hypothyroidism: Etiology unclear, patient denies any history of thyroidectomy nor radioactive iodine thyroid ablation. -She is currently on levothyroxine 75 mcg p.o. nightly.  - We discussed about correct intake of levothyroxine, at fasting, with water, separated by at least 30 minutes from breakfast, and separated by more than 4 hours from calcium, iron, multivitamins, acid reflux medications (PPIs). -Patient is made aware of the fact that thyroid hormone replacement is needed for life, dose to be adjusted by periodic monitoring of thyroid function tests.  6) Chronic Care/Health Maintenance:  -she  is on lisinopril and  is encouraged to continue to follow up with Ophthalmology, Dentist,  Podiatrist at least yearly or according to recommendations, and advised to  stay away from smoking. I have recommended yearly flu vaccine and pneumonia vaccination at least every 5 years; moderate intensity exercise for up to 150 minutes weekly; and  sleep for at least 7 hours a day.  - I advised patient to maintain close follow up with Ignatius Specking, MD for primary care needs.  - Time spent  with the patient: 25 min, of which >50% was spent in reviewing her blood glucose logs , discussing her hypo- and hyper-glycemic episodes, reviewing her current and  previous labs and insulin doses and developing a plan to avoid hypo- and hyper-glycemia. Please refer to Patient Instructions for Blood Glucose Monitoring and Insulin/Medications Dosing Guide"  in media tab for additional information. Kimberly [name] participated in the discussions, expressed understanding, and voiced agreement with the above plans.  All questions were answered to her satisfaction. she is encouraged to contact clinic should she have any questions or concerns prior to her return visit.  Follow up plan: - Return in about 6 weeks (around 03/28/2018) for follow up with pre-visit labs, meter, and logs.  Marquis Lunch, MD [facility] [address] [place], [place] [address] Phone: [phone]  Fax: [phone]    02/14/2018, 1:06 PM  This note was partially dictated with voice recognition software. Similar sounding words can be transcribed inadequately or may not  be corrected upon review.

## 2018-02-14 NOTE — Patient Instructions (Signed)

## 2018-03-23 LAB — COMPLETE METABOLIC PANEL WITH GFR
AG RATIO: 1.6 (calc) (ref 1.0–2.5)
ALBUMIN MSPROF: 4.1 g/dL (ref 3.6–5.1)
ALT: 16 U/L (ref 6–29)
AST: 33 U/L (ref 10–35)
Alkaline phosphatase (APISO): 82 U/L (ref 33–130)
BUN: 14 mg/dL (ref 7–25)
CALCIUM: 9 mg/dL (ref 8.6–10.4)
CO2: 29 mmol/L (ref 20–32)
CREATININE: 0.99 mg/dL (ref 0.50–0.99)
Chloride: 106 mmol/L (ref 98–110)
GFR, EST NON AFRICAN AMERICAN: 61 mL/min/{1.73_m2} (ref >=60)
GFR, Est African American: 71 mL/min/{1.73_m2} (ref >=60)
GLOBULIN: 2.6 g/dL (ref 1.9–3.7)
Glucose, Bld: 105 mg/dL — ABNORMAL HIGH (ref 65–99)
POTASSIUM: 4.1 mmol/L (ref 3.5–5.3)
SODIUM: 143 mmol/L (ref 135–146)
Total Bilirubin: 1.9 mg/dL — ABNORMAL HIGH (ref 0.2–1.2)
Total Protein: 6.7 g/dL (ref 6.1–8.1)

## 2018-03-23 LAB — LIPID PANEL
CHOL/HDL RATIO: 2.5 (calc) (ref <5.0)
Cholesterol: 113 mg/dL (ref <200)
HDL: 46 mg/dL — AB (ref >50)
LDL Cholesterol (Calc): 48 mg/dL (calc)
NON-HDL CHOLESTEROL (CALC): 67 mg/dL (ref <130)
Triglycerides: 111 mg/dL (ref <150)

## 2018-03-23 LAB — TSH: TSH: 6.83 mIU/L — ABNORMAL HIGH (ref 0.40–4.50)

## 2018-03-23 LAB — T4, FREE: Free T4: 1.1 ng/dL (ref 0.8–1.8)

## 2018-03-24 LAB — MICROALBUMIN / CREATININE URINE RATIO
CREATININE, URINE: 39 mg/dL (ref 20–275)
MICROALB UR: 0.4 mg/dL
MICROALB/CREAT RATIO: 10 ug/mg{creat} (ref <30)

## 2018-03-24 LAB — HEMOGLOBIN A1C
Hgb A1c MFr Bld: 6.8 % of total Hgb — ABNORMAL HIGH (ref <5.7)
Mean Plasma Glucose: 148 (calc)
eAG (mmol/L): 8.2 (calc)

## 2018-03-28 ENCOUNTER — Encounter: Payer: Self-pay | Admitting: "Endocrinology

## 2018-03-28 ENCOUNTER — Ambulatory Visit (INDEPENDENT_AMBULATORY_CARE_PROVIDER_SITE_OTHER): Payer: Medicare Other | Admitting: "Endocrinology

## 2018-03-28 VITALS — BP 161/82 | HR 71 | Ht 60.0 in | Wt 228.0 lb

## 2018-03-28 DIAGNOSIS — I10 Essential (primary) hypertension: Secondary | ICD-10-CM

## 2018-03-28 DIAGNOSIS — E039 Hypothyroidism, unspecified: Secondary | ICD-10-CM | POA: Diagnosis not present

## 2018-03-28 DIAGNOSIS — E1159 Type 2 diabetes mellitus with other circulatory complications: Secondary | ICD-10-CM | POA: Diagnosis not present

## 2018-03-28 DIAGNOSIS — E782 Mixed hyperlipidemia: Secondary | ICD-10-CM

## 2018-03-28 MED ORDER — LEVOTHYROXINE SODIUM 88 MCG PO TABS: 88.0000 ug | ORAL_TABLET | Freq: Every day | ORAL | 6 refills | Status: DC

## 2018-03-28 MED ORDER — INSULIN LISPRO 200 UNIT/ML ~~LOC~~ SOPN: 18.0000 [IU] | PEN_INJECTOR | Freq: Three times a day (TID) | SUBCUTANEOUS | 2 refills | Status: DC

## 2018-03-28 NOTE — Patient Instructions (Signed)

## 2018-03-28 NOTE — Progress Notes (Signed)
Endocrinology follow-up note       03/28/2018, 2:48 PM   Subjective:    Patient ID: Kimberly [name], female    DOB: 08/20/1956.  Kimberly [name] is being seen in follow-up for management of currently uncontrolled symptomatic diabetes requested by  Ignatius Specking, MD.   Past Medical History:  Diagnosis Date  . Anemia    PMH: as a child  . Arthritis   . Asthma   . CAD (coronary artery disease)    nonobstructive by cath, 6/08 (false positive Cardiolite) normal stress echo, 7/11  . CHF (congestive heart failure) (HCC)   . Chronic back pain   . Chronic bronchitis (HCC)   . Complication of anesthesia    had an asthma attack when woke up from procedure  . Complication of anesthesia    asthma attack with one surgery  . COPD (chronic obstructive pulmonary disease) (HCC)   . Degenerative joint disease   . Depression   . Diabetes mellitus without complication (HCC)   . DJD (degenerative joint disease)   . Fatty liver   . Fibromyalgia   . GERD (gastroesophageal reflux disease)   . Glaucoma   . Headache(784.0)   . Heart failure, diastolic, chronic (HCC)   . Heart murmur    PMH:As a child only  . Hernia of abdominal cavity    "upper and lower hernia"  . History of hiatal hernia   . HTN (hypertension)   . Hypertension   . Hypothyroidism   . IBS (irritable bowel syndrome)   . IDDM (insulin dependent diabetes mellitus) (HCC)   . Morbid obesity (HCC)   . Neuropathy    associated with diabetes  . Obstructive sleep apnea   . Pancreatitis   . Pinched nerve in neck   . Pneumonia    as a child  . Pulmonary hypertension (HCC)   . Rheumatic fever    PMH: as a child  . Seizures (HCC)    PMH: only as a child  . Seizures (HCC)    in childhood  . Shortness of breath   . Sleep apnea   . Spinal stenosis   . Stroke [facility])    12/09/2013   Past Surgical History:  Procedure Laterality Date  . ABDOMINAL  HYSTERECTOMY    . ABDOMINAL HYSTERECTOMY     partial hysterectomy  . APPENDECTOMY    . BACK SURGERY    . BACK SURGERY     disc surgery with complications and damage to right side  . BILATERAL KNEE ARTHROSCOPY  2000, 2009  . CARDIAC CATHETERIZATION     2009  . CATARACT EXTRACTION W/ INTRAOCULAR LENS  IMPLANT, BILATERAL    . CHOLECYSTECTOMY  1994  . CHOLECYSTECTOMY    . COLONOSCOPY N/A 08/01/2013   Procedure: COLONOSCOPY;  Surgeon: Malissa Hippo, MD;  [facility];  Service: Endoscopy;  Laterality: N/A;  930  . COLONOSCOPY N/A 08/03/2017   Procedure: COLONOSCOPY;  Surgeon: Malissa Hippo, MD;  Location[facility];  Service: Endoscopy;  Laterality: N/A;  12:00  . CYST EXCISION     Left breast  .  EXCISIONAL HEMORRHOIDECTOMY    . EYE SURGERY  1967  . EYE SURGERY     bilateral cataract removal with lens implants  . EYE SURGERY     growth removed from right eye lid  . EYE SURGERY     bilateral eye surgery age 40 t"to straighten eyes'  . heel (other)  2005  . HEEL SPUR SURGERY     Right heel - growth removal and rebuilding of heel  . HEMORRHOID SURGERY    . HERNIA REPAIR    . KNEE ARTHROSCOPY Bilateral   . LUMBAR LAMINECTOMY/DECOMPRESSION MICRODISCECTOMY Right 08/23/2014   Procedure: LUMBAR LAMINECTOMY/DECOMPRESSION MICRODISCECTOMY 1 LEVEL;  Surgeon: Temple Pacini, MD;  Location: [facility];  Service: Neurosurgery;  Laterality: Right;  LUMBAR LAMINECTOMY/DECOMPRESSION MICRODISCECTOMY 1 LEVEL LUMBAR 2-3  . ovaries removed  2003  . PARTIAL HYSTERECTOMY  1981  . SPHINCTEROTOMY    . spinahatomy  1992  . TUBAL LIGATION  1975  . TUBAL LIGATION     Social History   Socioeconomic History  . Marital status: Married    Spouse name: Not on file  . Number of children: Not on file  . Years of education: Not on file  . Highest education level: Not on file  Occupational History  . Not on file  Social Needs  . Financial resource strain: Not on file  . Food insecurity:     Worry: Not on file    Inability: Not on file  . Transportation needs:    Medical: Not on file    Non-medical: Not on file  Tobacco Use  . Smoking status: Never Smoker  . Smokeless tobacco: Never Used  Substance and Sexual Activity  . Alcohol use: No  . Drug use: No  . Sexual activity: Not on file  Lifestyle  . Physical activity:    Days per week: Not on file    Minutes per session: Not on file  . Stress: Not on file  Relationships  . Social connections:    Talks on phone: Not on file    Gets together: Not on file    Attends religious service: Not on file    Active member of club or organization: Not on file    Attends meetings of clubs or organizations: Not on file    Relationship status: Not on file  Other Topics Concern  . Not on file  Social History Narrative   ** Merged History Encounter **       Regularly exercises. Full Time.    Outpatient Encounter Medications as of 03/28/2018  Medication Sig  . acetaminophen (TYLENOL) 325 MG tablet Take 2 tablets (650 mg total) by mouth every 6 (six) hours as needed for mild pain (or Fever >/= 101).  Marland Kitchen albuterol (PROVENTIL) (2.5 MG/3ML) 0.083% nebulizer solution Inhale 2.5 mg into the lungs every 6 (six) hours as needed for wheezing or shortness of breath.   Marland Kitchen amLODipine (NORVASC) 2.5 MG tablet Take 2.5 mg by mouth daily.   Marland Kitchen aspirin EC 81 MG tablet Take 81 mg by mouth daily.  . carvedilol (COREG) 12.5 MG tablet Take 6.25 mg by mouth 2 (two) times daily with a meal.   . Continuous Blood Gluc Sensor (FREESTYLE LIBRE 14 DAY SENSOR) MISC Inject 1 each into the skin every 14 (fourteen) days. Use as directed.  . DULoxetine (CYMBALTA) 60 MG capsule Take 60 mg by mouth daily.   . famotidine (PEPCID) 40 MG tablet Take 40 mg by mouth every morning.  Marland Kitchen  furosemide (LASIX) 20 MG tablet Take 20 mg by mouth daily.   . Insulin Glargine (TOUJEO SOLOSTAR) 300 UNIT/ML SOPN Inject 50 Units into the skin at bedtime.  . Insulin Lispro (HUMALOG KWIKPEN)  200 UNIT/ML SOPN Inject 18-24 Units into the skin 3 (three) times daily before meals. Follow sliding scale given by your PCP  . levothyroxine (SYNTHROID, LEVOTHROID) 88 MCG tablet Take 1 tablet (88 mcg total) by mouth daily before breakfast.  . lisinopril (PRINIVIL,ZESTRIL) 20 MG tablet Take 20 mg by mouth daily.   . potassium chloride (K-DUR) 10 MEQ tablet Take 5 mEq by mouth 2 (two) times daily.   . traMADol (ULTRAM) 50 MG tablet Take 50 mg by mouth every 6 (six) hours as needed for moderate pain.   . [DISCONTINUED] Insulin Lispro (HUMALOG KWIKPEN) 200 UNIT/ML SOPN Inject 18-24 Units into the skin 3 (three) times daily before meals. Follow sliding scale given by your PCP  . [DISCONTINUED] levothyroxine (SYNTHROID, LEVOTHROID) 75 MCG tablet Take 75 mcg by mouth daily before breakfast.   No facility-administered encounter medications on file as of 03/28/2018.     ALLERGIES: Allergies  Allergen Reactions  . Hydrocodone Other (See Comments)    sedation  . Naproxen Anaphylaxis and Other (See Comments)    REACTION: throat swelling  . Other Shortness Of Breath and Other (See Comments)    Local anesthesia Had asthma attack  . Other Shortness Of Breath and Other (See Comments)    Pt states a sedative agent used prior to a surgical procedure ( Pt states it "sounds like Phenergan. It should be in my records from [place].") caused an asthma attack.   . Oxycodone Other (See Comments)    sedation  . Sulfonamide Derivatives Other (See Comments)    Yeast infection  . Cinnamon Other (See Comments)    Unknown  . Feldene [Piroxicam] Nausea Only  . Lyrica [Pregabalin] Other (See Comments)    Unknown  . Phenergan [Promethazine Hcl] Other (See Comments)    triggers asthma attacks  . Sulfa Antibiotics Other (See Comments)    (Yeast infection, per patient.)  . Latex Rash  . Latex Rash and Other (See Comments)    "Blistery" rash.   . Tape Rash and Other (See Comments)    Do not use adhesive tape;  okay to use paper tape.    VACCINATION STATUS: Immunization History  Administered Date(s) Administered  . Influenza,inj,Quad PF,6+ Mos 08/26/2014  . Pneumococcal Polysaccharide-23 08/26/2014    Diabetes  She presents for her follow-up diabetic visit. She has type 2 diabetes mellitus. Onset time: She was diagnosed at approximate age of 51 years. Her disease course has been improving. There are no hypoglycemic associated symptoms. Pertinent negatives for hypoglycemia include no confusion, headaches, pallor or seizures. Associated symptoms include fatigue. Pertinent negatives for diabetes include no blurred vision, no chest pain, no polydipsia, no polyphagia and no polyuria. There are no hypoglycemic complications. Symptoms are improving. Diabetic complications include a CVA and heart disease. Risk factors for coronary artery disease include dyslipidemia, diabetes mellitus, family history, obesity, hypertension, post-menopausal and sedentary lifestyle. Current diabetic treatments: She is on Lantus 35 units nightly and Humalog 22 units 3 times daily AC. Her weight is fluctuating minimally. She is following a generally unhealthy diet. When asked about meal planning, she reported none. She has not had a previous visit with a dietitian. She never participates in exercise. Her home blood glucose trend is increasing steadily. Her breakfast blood glucose range is generally 140-180 mg/dl.  Her [name] blood glucose range is generally 140-180 mg/dl. Her dinner blood glucose range is generally 140-180 mg/dl. Her bedtime blood glucose range is generally 180-200 mg/dl. Her overall blood glucose range is 140-180 mg/dl. (She brought a log showing persistently above target blood glucose readings greater than 200 mg/dL.) An ACE inhibitor/angiotensin II receptor blocker is being taken. Eye exam is current.  Hyperlipidemia  This is a chronic problem. The current episode started more than 1 year ago. The problem is controlled.  Exacerbating diseases include diabetes, hypothyroidism and obesity. Pertinent negatives include no chest pain, myalgias or shortness of breath. Current antihyperlipidemic treatment includes statins. Risk factors for coronary artery disease include family history, dyslipidemia, diabetes mellitus, hypertension, obesity, post-menopausal and a sedentary lifestyle.  Hypertension  This is a chronic problem. The current episode started more than 1 year ago. Pertinent negatives include no blurred vision, chest pain, headaches, palpitations or shortness of breath. Risk factors for coronary artery disease include dyslipidemia, diabetes mellitus, obesity, sedentary lifestyle and post-menopausal state. Past treatments include calcium channel blockers and ACE inhibitors. Hypertensive end-organ damage includes CVA.    Review of Systems  Constitutional: Positive for fatigue. Negative for chills, fever and unexpected weight change.  HENT: Negative for trouble swallowing and voice change.   Eyes: Negative for blurred vision and visual disturbance.  Respiratory: Negative for cough, shortness of breath and wheezing.   Cardiovascular: Negative for chest pain, palpitations and leg swelling.  Gastrointestinal: Negative for diarrhea, nausea and vomiting.  Endocrine: Negative for cold intolerance, heat intolerance, polydipsia, polyphagia and polyuria.  Musculoskeletal: Positive for gait problem. Negative for arthralgias and myalgias.       She walks with a cane related to her hemiparesis from stroke on the right side.  Skin: Negative for color change, pallor, rash and wound.  Neurological: Negative for seizures and headaches.  Psychiatric/Behavioral: Negative for confusion and suicidal ideas.    Objective:    BP (!) 161/82   Pulse 71   Ht 5' (1.524 m)   Wt 228 lb (103.4 kg)   BMI 44.53 kg/m   Wt Readings from Last 3 Encounters:  03/28/18 228 lb (103.4 kg)  02/14/18 225 lb (102.1 kg)  02/08/18 224 lb (101.6  kg)     Physical Exam  Constitutional: She is oriented to person, place, and time. She appears well-developed.  HENT:  Head: Normocephalic and atraumatic.  Eyes: EOM are normal.  Neck: Normal range of motion. Neck supple. No tracheal deviation present. No thyromegaly present.  Cardiovascular: Normal rate.  Pulmonary/Chest: Effort normal.  Abdominal: There is no tenderness. There is no guarding.  Musculoskeletal: Normal range of motion. She exhibits no edema.  Neurological: She is alert and oriented to person, place, and time. She has normal reflexes. No cranial nerve deficit. Coordination normal.  Skin: Skin is warm and dry. No rash noted. No erythema. No pallor.  Psychiatric: She has a normal mood and affect. Judgment normal.    CMP     Component Value Date/Time   NA 143 03/23/2018 1258   K 4.1 03/23/2018 1258   CL 106 03/23/2018 1258   CO2 29 03/23/2018 1258   GLUCOSE 105 (H) 03/23/2018 1258   BUN 14 03/23/2018 1258   CREATININE 0.99 03/23/2018 1258   CALCIUM 9.0 03/23/2018 1258   PROT 6.7 03/23/2018 1258   ALBUMIN 3.6 05/27/2017 1049   AST 33 03/23/2018 1258   ALT 16 03/23/2018 1258   ALKPHOS 76 05/27/2017 1049   BILITOT 1.9 (H) 03/23/2018  1258   GFRNONAA 61 03/23/2018 1258   GFRAA 71 03/23/2018 1258    Lipid Panel     Component Value Date/Time   CHOL 113 03/23/2018 1258   TRIG 111 03/23/2018 1258   HDL 46 (L) 03/23/2018 1258   CHOLHDL 2.5 03/23/2018 1258   VLDL 28 04/07/2017 0603   LDLCALC 48 03/23/2018 1258      Lab Results  Component Value Date   TSH 6.83 (H) 03/23/2018   TSH 2.54 09/29/2016   FREET4 1.1 03/23/2018    A1c was 11.2% on December 20, 2017.  Assessment & Plan:   1. DM type 2 causing vascular disease (HCC)  - Kimberly [name] has currently uncontrolled symptomatic type 2 DM since 62 years of age. -He came with significant improvement in her A1c to 6.8%, improving from 11.2%.    -She has no documented no reported hypoglycemia.   -    Her recent A1c was 11.2%. -  Recent labs reviewed.  -her diabetes is complicated by CVA with right sided hemiparesis, obesity/sedentary life , congestive heart failure and Kimberly [name] remains at a high risk for more acute and chronic complications which include CAD, CVA, CKD, retinopathy, and neuropathy. These are all discussed in detail with the patient.  - I have counseled her on diet management and weight loss, by adopting a carbohydrate restricted/protein rich diet.  -  Suggestion is made for her to avoid simple carbohydrates  from her diet including Cakes, Sweet Desserts / Pastries, Ice Cream, Soda (diet and regular), Sweet Tea, Candies, Chips, Cookies, Store Bought Juices, Alcohol in Excess of  1-2 drinks a day, Artificial Sweeteners, and "Sugar-free" Products. This will help patient to have stable blood glucose profile and potentially avoid unintended weight gain.   - I encouraged her to switch to  unprocessed or minimally processed complex starch and increased protein intake (animal or plant source), fruits, and vegetables.  - she is advised to stick to a routine mealtimes to eat 3 meals  a day and avoid unnecessary snacks ( to snack only to correct hypoglycemia).   - she is scheduled with Norm Salt, RDN, CDE for individualized diabetes education.  - I have approached her with the following individualized plan to manage diabetes and patient agrees:   -She came with improved A1c of 6.8% from 11.2%. -Her current insulin program is adequate. -I advised her to continue  Lantus 50 units nightly, and to do Humalog 18  units 3 times a day with meals  for pre-meal blood glucose  readings of 90-150mg /dl, plus patient specific correction dose for unexpected hyperglycemia above /dl, associated with strict monitoring of glucose 4 times a day-before meals and at bedtime. - Patient is warned not to take insulin without proper monitoring per orders. -Adjustment parameters are given for  hypo and hyperglycemia in writing. -Her insurance did not provide adequate coverage for the CGM device, decided not to get it. -Patient is encouraged to call clinic for blood glucose levels less than 70 or above 300 mg /dl. -She reports intolerance to Victoza and metformin.   - Patient specific target  A1c;  LDL, HDL, Triglycerides, and  Waist Circumference were discussed in detail.  2) BP/HTN: Her blood pressure is not controlled to target.  She is advised to continue on lisinopril and amlodipine.   3) Lipids/HPL: Her recent lipid panel showed controlled LDL at 84.  Patient is not on statins.  She reports multiple allergies to medications.   4)  Weight/Diet: CDE Consult will be initiated , exercise, and detailed carbohydrates information provided.  She would benefit from bariatric surgery.  I discussed and gave her contact information about with [facility].   5.  Hypothyroidism: Etiology unclear, patient denies any history of thyroidectomy nor radioactive iodine thyroid ablation. -Her thyroid function tests are consistent with under -replacement -I discussed and increase her levothyroxine to 88 mcg p.o. q. a.m.    - We discussed about correct intake of levothyroxine, at fasting, with water, separated by at least 30 minutes from breakfast, and separated by more than 4 hours from calcium, iron, multivitamins, acid reflux medications (PPIs). -Patient is made aware of the fact that thyroid hormone replacement is needed for life, dose to be adjusted by periodic monitoring of thyroid function tests.  6) Chronic Care/Health Maintenance:  -she  is on lisinopril and  is encouraged to continue to follow up with Ophthalmology, Dentist,  Podiatrist at least yearly or according to recommendations, and advised to  stay away from smoking. I have recommended yearly flu vaccine and pneumonia vaccination at least every 5 years; moderate intensity exercise for up to 150 minutes weekly; and  sleep  for at least 7 hours a day.  - I advised patient to maintain close follow up with Ignatius Specking, MD for primary care needs.  - Time spent with the patient: 25 min, of which >50% was spent in reviewing her blood glucose logs , discussing her hypo- and hyper-glycemic episodes, reviewing her current and  previous labs and insulin doses and developing a plan to avoid hypo- and hyper-glycemia. Please refer to Patient Instructions for Blood Glucose Monitoring and Insulin/Medications Dosing Guide"  in media tab for additional information. Kimberly [name] participated in the discussions, expressed understanding, and voiced agreement with the above plans.  All questions were answered to her satisfaction. she is encouraged to contact clinic should she have any questions or concerns prior to her return visit.   Follow up plan: - Return in about 4 months (around 07/29/2018) for meter, and logs.  Kimberly Lunch, MD [facility] [address] [place], [place] [address] Phone: [phone]  Fax: [phone]    03/28/2018, 2:48 PM  This note was partially dictated with voice recognition software. Similar sounding words can be transcribed inadequately or may not  be corrected upon review.

## 2018-05-04 ENCOUNTER — Telehealth (INDEPENDENT_AMBULATORY_CARE_PROVIDER_SITE_OTHER): Payer: Self-pay | Admitting: *Deleted

## 2018-05-04 NOTE — Telephone Encounter (Signed)
Please call patient [phone] -- she wants to discuss last scan she had done

## 2018-05-05 NOTE — Telephone Encounter (Signed)
Message left on answering machine 

## 2018-05-08 ENCOUNTER — Telehealth (INDEPENDENT_AMBULATORY_CARE_PROVIDER_SITE_OTHER): Payer: Self-pay | Admitting: Internal Medicine

## 2018-05-08 NOTE — Telephone Encounter (Signed)
Patient would like you to call her at [phone]

## 2018-05-09 NOTE — Telephone Encounter (Signed)
I have talked with patient.

## 2018-05-09 NOTE — Telephone Encounter (Signed)
She wanted to know about weight loss surgery.

## 2018-05-10 ENCOUNTER — Telehealth: Payer: Self-pay

## 2018-05-10 NOTE — Telephone Encounter (Signed)
Pt is asking if we can send a referral to [facility] [place].

## 2018-05-10 NOTE — Telephone Encounter (Signed)
Patients usually initiate weight loss consults directly. I  do not know the referral procedures of [facility]. [facility]. If so, she has to get it from her PMD.  She also has a choice at [facility].

## 2018-05-12 NOTE — Telephone Encounter (Signed)
Pt prefers [facility]. Sent records to office.

## 2018-05-15 ENCOUNTER — Ambulatory Visit: Payer: Medicare Other | Admitting: Nutrition

## 2018-06-19 ENCOUNTER — Encounter (INDEPENDENT_AMBULATORY_CARE_PROVIDER_SITE_OTHER): Payer: Self-pay | Admitting: Internal Medicine

## 2018-06-19 ENCOUNTER — Encounter (INDEPENDENT_AMBULATORY_CARE_PROVIDER_SITE_OTHER): Payer: Self-pay | Admitting: *Deleted

## 2018-06-19 ENCOUNTER — Ambulatory Visit (INDEPENDENT_AMBULATORY_CARE_PROVIDER_SITE_OTHER): Payer: Medicare Other | Admitting: Internal Medicine

## 2018-06-19 VITALS — BP 160/82 | HR 60 | Temp 97.7°F | Ht 60.0 in | Wt 232.9 lb

## 2018-06-19 DIAGNOSIS — K7460 Unspecified cirrhosis of liver: Secondary | ICD-10-CM

## 2018-06-19 LAB — HEPATIC FUNCTION PANEL
AG RATIO: 1.4 (calc) (ref 1.0–2.5)
ALKALINE PHOSPHATASE (APISO): 84 U/L (ref 33–130)
ALT: 14 U/L (ref 6–29)
AST: 23 U/L (ref 10–35)
Albumin: 3.7 g/dL (ref 3.6–5.1)
Bilirubin, Direct: 0.2 mg/dL (ref 0.0–0.2)
Globulin: 2.6 g/dL (calc) (ref 1.9–3.7)
Indirect Bilirubin: 0.9 mg/dL (calc) (ref 0.2–1.2)
TOTAL PROTEIN: 6.3 g/dL (ref 6.1–8.1)
Total Bilirubin: 1.1 mg/dL (ref 0.2–1.2)

## 2018-06-19 LAB — SEDIMENTATION RATE: Sed Rate: 19 mm/h (ref 0–30)

## 2018-06-19 NOTE — Progress Notes (Signed)
Subjective:    Patient ID: Kimberly [name], female    DOB: 1955-12-07, 62 y.o.   MRN: [id number]  HPI Here today for f/u. Last seen in January of this year. Hx of pancreatitis in May of last year. Pancreatitis felt to be idiopathic or possibly due to Victoza. Hx of diabetes.  Last conversation in January her blood sugars were not controlled.  03/23/2018 HA1C 6.8 was down from  11.2 in January. She is followed by Dr. Fransico Him. Her last colonoscopy was in May of 2018 revealed diverticulosis and internal  hemorrhoids. She has gained about 13 pounds since her last visit.  She sometimes has to strain when she has a BM. She also c/o back pain radiating into her groin. She says when this occurs she becomes constipated. Her appetite is okay. She says she has to make herself eat, but she still gains weight.  She has an appt with Dr. Lily Peer for possible weight loss surgery.  Follow up CT in July of 2018 revealed:    IMPRESSION: Again demonstrated is a mild hazy density in the fat of the splenic hilum adjacent to the pancreatic tail. This is unchanged over the last 2 scans and may represent an inactive appearance. There is no other sign of pancreatic inflammation. Though unlikely, the differential diagnosis does include retroperitoneal fatty tumor. One might consider a follow-up scan in 1 year to see if this has changed.   Cirrhosis of the liver. Borderline splenomegaly. Splenic varices with splenorenal shunt, all unchanged. Hx of fatty liver/cirrhosis.    04/21/2017 SMA less than 20, AN 1.80, Acute Hepatitis Panel negative. ANA positive, ceruloplasmin 29, alpha 1 antitrypsin 147, PT/INR 11.0 and 1.0 CBC    Component Value Date/Time   WBC 5.4 04/21/2017 1518   RBC 4.22 04/21/2017 1518   HGB 13.0 04/21/2017 1518   HCT 39.4 04/21/2017 1518   PLT 199 04/21/2017 1518   MCV 93.4 04/21/2017 1518   MCH 30.8 04/21/2017 1518   MCHC 33.0 04/21/2017 1518   RDW 16.0 (H) 04/21/2017 1518   LYMPHSABS 1,890 04/21/2017 1518   MONOABS 324 04/21/2017 1518   EOSABS 0 (L) 04/21/2017 1518   BASOSABS 0 04/21/2017 1518     Review of Systems Past Medical History:  Diagnosis Date  . Anemia    PMH: as a child  . Arthritis   . Asthma   . CAD (coronary artery disease)    nonobstructive by cath, 6/08 (false positive Cardiolite) normal stress echo, 7/11  . CHF (congestive heart failure) (HCC)   . Chronic back pain   . Chronic bronchitis (HCC)   . Complication of anesthesia    had an asthma attack when woke up from procedure  . Complication of anesthesia    asthma attack with one surgery  . COPD (chronic obstructive pulmonary disease) (HCC)   . Degenerative joint disease   . Depression   . Diabetes mellitus without complication (HCC)   . DJD (degenerative joint disease)   . Fatty liver   . Fibromyalgia   . GERD (gastroesophageal reflux disease)   . Glaucoma   . Headache(784.0)   . Heart failure, diastolic, chronic (HCC)   . Heart murmur    PMH:As a child only  . Hernia of abdominal cavity    "upper and lower hernia"  . History of hiatal hernia   . HTN (hypertension)   . Hypertension   . Hypothyroidism   . IBS (irritable bowel syndrome)   . IDDM (insulin dependent  diabetes mellitus) (HCC)   . Morbid obesity (HCC)   . Neuropathy    associated with diabetes  . Obstructive sleep apnea   . Pancreatitis   . Pinched nerve in neck   . Pneumonia    as a child  . Pulmonary hypertension (HCC)   . Rheumatic fever    PMH: as a child  . Seizures (HCC)    PMH: only as a child  . Seizures (HCC)    in childhood  . Shortness of breath   . Sleep apnea   . Spinal stenosis   . Stroke [facility](HCC)    12/09/2013    Past Surgical History:  Procedure Laterality Date  . ABDOMINAL HYSTERECTOMY    . ABDOMINAL HYSTERECTOMY     partial hysterectomy  . APPENDECTOMY    . BACK SURGERY    . BACK SURGERY     disc surgery with complications and damage to right side  . BILATERAL KNEE  ARTHROSCOPY  2000, 2009  . CARDIAC CATHETERIZATION     2009  . CATARACT EXTRACTION W/ INTRAOCULAR LENS  IMPLANT, BILATERAL    . CHOLECYSTECTOMY  1994  . CHOLECYSTECTOMY    . COLONOSCOPY N/A 08/01/2013   Procedure: COLONOSCOPY;  Surgeon: Malissa HippoNajeeb U Rehman, MD;  Location: [facility];  Service: Endoscopy;  Laterality: N/A;  930  . COLONOSCOPY N/A 08/03/2017   Procedure: COLONOSCOPY;  Surgeon: Malissa Hippoehman, Najeeb U, MD;  Location: [facility];  Service: Endoscopy;  Laterality: N/A;  12:00  . CYST EXCISION     Left breast  . EXCISIONAL HEMORRHOIDECTOMY    . EYE SURGERY  1967  . EYE SURGERY     bilateral cataract removal with lens implants  . EYE SURGERY     growth removed from right eye lid  . EYE SURGERY     bilateral eye surgery age 89 t"to straighten eyes'  . heel (other)  2005  . HEEL SPUR SURGERY     Right heel - growth removal and rebuilding of heel  . HEMORRHOID SURGERY    . HERNIA REPAIR    . KNEE ARTHROSCOPY Bilateral   . LUMBAR LAMINECTOMY/DECOMPRESSION MICRODISCECTOMY Right 08/23/2014   Procedure: LUMBAR LAMINECTOMY/DECOMPRESSION MICRODISCECTOMY 1 LEVEL;  Surgeon: Temple PaciniHenry A Pool, MD;  Location: [facility];  Service: Neurosurgery;  Laterality: Right;  LUMBAR LAMINECTOMY/DECOMPRESSION MICRODISCECTOMY 1 LEVEL LUMBAR 2-3  . ovaries removed  2003  . PARTIAL HYSTERECTOMY  1981  . SPHINCTEROTOMY    . spinahatomy  1992  . TUBAL LIGATION  1975  . TUBAL LIGATION      Allergies  Allergen Reactions  . Hydrocodone Other (See Comments)    sedation  . Naproxen Anaphylaxis and Other (See Comments)    REACTION: throat swelling  . Other Shortness Of Breath and Other (See Comments)    Local anesthesia Had asthma attack  . Other Shortness Of Breath and Other (See Comments)    Pt states a sedative agent used prior to a surgical procedure ( Pt states it "sounds like Phenergan. [place] caused an asthma attack.   . Oxycodone Other (See Comments)    sedation   . Sulfonamide Derivatives Other (See Comments)    Yeast infection  . Cinnamon Other (See Comments)    Unknown  . Feldene [Piroxicam] Nausea Only  . Lyrica [Pregabalin] Other (See Comments)    Unknown  . Phenergan [Promethazine Hcl] Other (See Comments)    triggers asthma attacks  . Sulfa Antibiotics  Other (See Comments)    (Yeast infection, per patient.)  . Latex Rash  . Latex Rash and Other (See Comments)    "Blistery" rash.   . Tape Rash and Other (See Comments)    Do not use adhesive tape; okay to use paper tape.    Current Outpatient Medications on File Prior to Visit  Medication Sig Dispense Refill  . acetaminophen (TYLENOL) 325 MG tablet Take 2 tablets (650 mg total) by mouth every 6 (six) hours as needed for mild pain (or Fever >/= 101).    Marland Kitchen albuterol (PROVENTIL) (2.5 MG/3ML) 0.083% nebulizer solution Inhale 2.5 mg into the lungs every 6 (six) hours as needed for wheezing or shortness of breath.     Marland Kitchen amLODipine (NORVASC) 2.5 MG tablet Take 2.5 mg by mouth daily.     Marland Kitchen aspirin EC 81 MG tablet Take 81 mg by mouth daily.    . carvedilol (COREG) 12.5 MG tablet Take 6.25 mg by mouth 2 (two) times daily with a meal.     . DULoxetine (CYMBALTA) 60 MG capsule Take 60 mg by mouth daily.     . famotidine (PEPCID) 40 MG tablet Take 40 mg by mouth every morning.    . furosemide (LASIX) 20 MG tablet Take 20 mg by mouth daily.     . Insulin Glargine (TOUJEO SOLOSTAR) 300 UNIT/ML SOPN Inject 50 Units into the skin at bedtime. 6 pen 2  . Insulin Lispro (HUMALOG KWIKPEN) 200 UNIT/ML SOPN Inject 18-24 Units into the skin 3 (three) times daily before meals. Follow sliding scale given by your PCP 15 mL 2  . levothyroxine (SYNTHROID, LEVOTHROID) 88 MCG tablet Take 1 tablet (88 mcg total) by mouth daily before breakfast. 30 tablet 6  . lisinopril (PRINIVIL,ZESTRIL) 20 MG tablet Take 20 mg by mouth daily.     . potassium chloride (K-DUR) 10 MEQ tablet Take 5 mEq by mouth 2 (two) times daily.       . traMADol (ULTRAM) 50 MG tablet Take 50 mg by mouth every 6 (six) hours as needed for moderate pain.     . Continuous Blood Gluc Sensor (FREESTYLE LIBRE 14 DAY SENSOR) MISC Inject 1 each into the skin every 14 (fourteen) days. Use as directed. (Patient not taking: Reported on 06/19/2018) 2 each 2   No current facility-administered medications on file prior to visit.         Objective:   Physical Exam Blood pressure (!) 160/82, pulse 60, temperature 97.7 F (36.5 C), height 5' (1.524 m), weight 232 lb 14.4 oz (105.6 kg). Alert and oriented. Skin warm and dry. Oral mucosa is moist.   . Sclera anicteric, conjunctivae is pink. Thyroid not enlarged. No cervical lymphadenopathy. Lungs clear. Heart regular rate and rhythm.  Abdomen is soft. Bowel sounds are positive. No hepatomegaly. No abdominal masses felt. No tenderness. 1+ edema to lower extremities.           Assessment & Plan:  Cirrhosis due to NAFLD. Am going to get an Hepatic, sedrate and [place] RUQ. OV in 6 months. Needs to diet and exercise.

## 2018-06-19 NOTE — Patient Instructions (Signed)
Labs and US. OV in 6 months.  

## 2018-06-20 ENCOUNTER — Other Ambulatory Visit: Payer: Self-pay | Admitting: "Endocrinology

## 2018-06-23 ENCOUNTER — Ambulatory Visit (HOSPITAL_COMMUNITY)
Admission: RE | Admit: 2018-06-23 | Discharge: 2018-06-23 | Disposition: A | Discharge status: Home / Self Care | Payer: Medicare Other | Source: Ambulatory Visit | Attending: Internal Medicine | Admitting: Internal Medicine

## 2018-06-23 DIAGNOSIS — Z9049 Acquired absence of other specified parts of digestive tract: Secondary | ICD-10-CM | POA: Diagnosis not present

## 2018-06-23 DIAGNOSIS — K7460 Unspecified cirrhosis of liver: Secondary | ICD-10-CM | POA: Insufficient documentation

## 2018-07-18 ENCOUNTER — Other Ambulatory Visit: Payer: Self-pay | Admitting: "Endocrinology

## 2018-07-28 LAB — TSH: TSH: 4.14 mIU/L (ref 0.40–4.50)

## 2018-07-28 LAB — COMPLETE METABOLIC PANEL WITH GFR
AG Ratio: 1.5 (calc) (ref 1.0–2.5)
ALBUMIN MSPROF: 4.1 g/dL (ref 3.6–5.1)
ALT: 16 U/L (ref 6–29)
AST: 33 U/L (ref 10–35)
Alkaline phosphatase (APISO): 73 U/L (ref 33–130)
BUN / CREAT RATIO: 16 (calc) (ref 6–22)
BUN: 17 mg/dL (ref 7–25)
CALCIUM: 8.9 mg/dL (ref 8.6–10.4)
CO2: 28 mmol/L (ref 20–32)
CREATININE: 1.04 mg/dL — AB (ref 0.50–0.99)
Chloride: 105 mmol/L (ref 98–110)
GFR, EST AFRICAN AMERICAN: 67 mL/min/{1.73_m2} (ref >=60)
GFR, EST NON AFRICAN AMERICAN: 58 mL/min/{1.73_m2} — AB (ref >=60)
Globulin: 2.7 g/dL (calc) (ref 1.9–3.7)
Glucose, Bld: 126 mg/dL — ABNORMAL HIGH (ref 65–99)
Potassium: 3.8 mmol/L (ref 3.5–5.3)
SODIUM: 141 mmol/L (ref 135–146)
Total Bilirubin: 1.7 mg/dL — ABNORMAL HIGH (ref 0.2–1.2)
Total Protein: 6.8 g/dL (ref 6.1–8.1)

## 2018-07-28 LAB — T4, FREE: Free T4: 1.3 ng/dL (ref 0.8–1.8)

## 2018-07-28 LAB — HEMOGLOBIN A1C
EAG (MMOL/L): 7 (calc)
HEMOGLOBIN A1C: 6 %{Hb} — AB (ref <5.7)
MEAN PLASMA GLUCOSE: 126 (calc)

## 2018-07-31 ENCOUNTER — Encounter: Payer: Self-pay | Admitting: "Endocrinology

## 2018-07-31 ENCOUNTER — Ambulatory Visit (INDEPENDENT_AMBULATORY_CARE_PROVIDER_SITE_OTHER): Payer: Medicare Other | Admitting: "Endocrinology

## 2018-07-31 VITALS — BP 148/82 | HR 59 | Ht 60.0 in | Wt 229.0 lb

## 2018-07-31 DIAGNOSIS — E782 Mixed hyperlipidemia: Secondary | ICD-10-CM

## 2018-07-31 DIAGNOSIS — E039 Hypothyroidism, unspecified: Secondary | ICD-10-CM

## 2018-07-31 DIAGNOSIS — E1159 Type 2 diabetes mellitus with other circulatory complications: Secondary | ICD-10-CM

## 2018-07-31 DIAGNOSIS — I10 Essential (primary) hypertension: Secondary | ICD-10-CM

## 2018-07-31 MED ORDER — LEVOTHYROXINE SODIUM 100 MCG PO TABS: 100.0000 ug | ORAL_TABLET | Freq: Every day | ORAL | 3 refills | Status: DC

## 2018-07-31 MED ORDER — INSULIN LISPRO 200 UNIT/ML ~~LOC~~ SOPN: 10.0000 [IU] | PEN_INJECTOR | Freq: Three times a day (TID) | SUBCUTANEOUS | 2 refills | Status: DC

## 2018-07-31 NOTE — Patient Instructions (Signed)

## 2018-07-31 NOTE — Progress Notes (Signed)
Endocrinology follow-up note       07/31/2018, 10:57 AM   Subjective:    Patient ID: Kimberly [name], female    DOB: December 24, 1958.  ANALYSE [name] is being seen in follow-up for management of currently uncontrolled symptomatic type 2 diabetes, hypothyroidism, hyperlipidemia, hypertension,. -PMD:   Ignatius Specking, MD.   Past Medical History:  Diagnosis Date  . Anemia    PMH: as a child  . Arthritis   . Asthma   . CAD (coronary artery disease)    nonobstructive by cath, 6/08 (false positive Cardiolite) normal stress echo, 7/11  . CHF (congestive heart failure) (HCC)   . Chronic back pain   . Chronic bronchitis (HCC)   . Complication of anesthesia    had an asthma attack when woke up from procedure  . Complication of anesthesia    asthma attack with one surgery  . COPD (chronic obstructive pulmonary disease) (HCC)   . Degenerative joint disease   . Depression   . Diabetes mellitus without complication (HCC)   . DJD (degenerative joint disease)   . Fatty liver   . Fibromyalgia   . GERD (gastroesophageal reflux disease)   . Glaucoma   . Headache(784.0)   . Heart failure, diastolic, chronic (HCC)   . Heart murmur    PMH:As a child only  . Hernia of abdominal cavity    "upper and lower hernia"  . History of hiatal hernia   . HTN (hypertension)   . Hypertension   . Hypothyroidism   . IBS (irritable bowel syndrome)   . IDDM (insulin dependent diabetes mellitus) (HCC)   . Morbid obesity (HCC)   . Neuropathy    associated with diabetes  . Obstructive sleep apnea   . Pancreatitis   . Pinched nerve in neck   . Pneumonia    as a child  . Pulmonary hypertension (HCC)   . Rheumatic fever    PMH: as a child  . Seizures (HCC)    PMH: only as a child  . Seizures (HCC)    in childhood  . Shortness of breath   . Sleep apnea   . Spinal stenosis   . Stroke [facility])    12/09/2013   Past Surgical  History:  Procedure Laterality Date  . ABDOMINAL HYSTERECTOMY    . ABDOMINAL HYSTERECTOMY     partial hysterectomy  . APPENDECTOMY    . BACK SURGERY    . BACK SURGERY     disc surgery with complications and damage to right side  . BILATERAL KNEE ARTHROSCOPY  2000, 2009  . CARDIAC CATHETERIZATION     2009  . CATARACT EXTRACTION W/ INTRAOCULAR LENS  IMPLANT, BILATERAL    . CHOLECYSTECTOMY  1994  . CHOLECYSTECTOMY    . COLONOSCOPY N/A 08/01/2013   Procedure: COLONOSCOPY;  Surgeon: Malissa Hippo, MD;  Location: [facility];  Service: Endoscopy;  Laterality: N/A;  930  . COLONOSCOPY N/A 08/03/2017   Procedure: COLONOSCOPY;  Surgeon: Malissa Hippo, MD;  Location: [facility];  Service: Endoscopy;  Laterality: N/A;  12:00  . CYST EXCISION  Left breast  . EXCISIONAL HEMORRHOIDECTOMY    . EYE SURGERY  1961  . EYE SURGERY     bilateral cataract removal with lens implants  . EYE SURGERY     growth removed from right eye lid  . EYE SURGERY     bilateral eye surgery age 62 t"to straighten eyes'  . heel (other)  2005  . HEEL SPUR SURGERY     Right heel - growth removal and rebuilding of heel  . HEMORRHOID SURGERY    . HERNIA REPAIR    . KNEE ARTHROSCOPY Bilateral   . LUMBAR LAMINECTOMY/DECOMPRESSION MICRODISCECTOMY Right 08/23/2014   Procedure: LUMBAR LAMINECTOMY/DECOMPRESSION MICRODISCECTOMY 1 LEVEL;  Surgeon: Temple Pacini, MD;  Location: [facility];  Service: Neurosurgery;  Laterality: Right;  LUMBAR LAMINECTOMY/DECOMPRESSION MICRODISCECTOMY 1 LEVEL LUMBAR 2-3  . ovaries removed  2003  . PARTIAL HYSTERECTOMY  1981  . SPHINCTEROTOMY    . spinahatomy  1992  . TUBAL LIGATION  1975  . TUBAL LIGATION     Social History   Socioeconomic History  . Marital status: Married    Spouse name: Not on file  . Number of children: Not on file  . Years of education: Not on file  . Highest education level: Not on file  Occupational History  . Not on file  Social Needs  . Financial  resource strain: Not on file  . Food insecurity:    Worry: Not on file    Inability: Not on file  . Transportation needs:    Medical: Not on file    Non-medical: Not on file  Tobacco Use  . Smoking status: Never Smoker  . Smokeless tobacco: Never Used  Substance and Sexual Activity  . Alcohol use: No  . Drug use: No  . Sexual activity: Not on file  Lifestyle  . Physical activity:    Days per week: Not on file    Minutes per session: Not on file  . Stress: Not on file  Relationships  . Social connections:    Talks on phone: Not on file    Gets together: Not on file    Attends religious service: Not on file    Active member of club or organization: Not on file    Attends meetings of clubs or organizations: Not on file    Relationship status: Not on file  Other Topics Concern  . Not on file  Social History Narrative   ** Merged History Encounter **       Regularly exercises. Full Time.    Outpatient Encounter Medications as of 07/31/2018  Medication Sig  . acetaminophen (TYLENOL) 325 MG tablet Take 2 tablets (650 mg total) by mouth every 6 (six) hours as needed for mild pain (or Fever >/= 101).  Marland Kitchen albuterol (PROVENTIL) (2.5 MG/3ML) 0.083% nebulizer solution Inhale 2.5 mg into the lungs every 6 (six) hours as needed for wheezing or shortness of breath.   Marland Kitchen amLODipine (NORVASC) 2.5 MG tablet Take 2.5 mg by mouth daily.   Marland Kitchen aspirin EC 81 MG tablet Take 81 mg by mouth daily.  . carvedilol (COREG) 12.5 MG tablet Take 6.25 mg by mouth 2 (two) times daily with a meal.   . DULoxetine (CYMBALTA) 60 MG capsule Take 60 mg by mouth daily.   . famotidine (PEPCID) 40 MG tablet Take 40 mg by mouth every morning.  . furosemide (LASIX) 20 MG tablet Take 20 mg by mouth daily.   . Insulin Lispro (HUMALOG KWIKPEN) 200  UNIT/ML SOPN Inject 10-16 Units into the skin 3 (three) times daily before meals.  Marland Kitchen levothyroxine (SYNTHROID, LEVOTHROID) 100 MCG tablet Take 1 tablet (100 mcg total) by mouth  daily before breakfast.  . lisinopril (PRINIVIL,ZESTRIL) 20 MG tablet Take 20 mg by mouth daily.   . potassium chloride (K-DUR) 10 MEQ tablet Take 5 mEq by mouth 2 (two) times daily.   Nathen May SOLOSTAR 300 UNIT/ML SOPN inject 50 units INTO THE SKIN AT BEDTIME  . traMADol (ULTRAM) 50 MG tablet Take 50 mg by mouth every 6 (six) hours as needed for moderate pain.   . [DISCONTINUED] Continuous Blood Gluc Sensor (FREESTYLE LIBRE 14 DAY SENSOR) MISC Inject 1 each into the skin every 14 (fourteen) days. Use as directed. (Patient not taking: Reported on 06/19/2018)  . [DISCONTINUED] HUMALOG KWIKPEN 200 UNIT/ML SOPN INJECT 18-24 UNITS INTO THE SKIN THREE TIMES DAILY BEFORE MEALS. (FOLLOWING SLLIDING SCALE GIVEN TO YOU BY YOUR DR)  . [DISCONTINUED] levothyroxine (SYNTHROID, LEVOTHROID) 88 MCG tablet Take 1 tablet (88 mcg total) by mouth daily before breakfast.   No facility-administered encounter medications on file as of 07/31/2018.     ALLERGIES: Allergies  Allergen Reactions  . Hydrocodone Other (See Comments)    sedation  . Naproxen Anaphylaxis and Other (See Comments)    REACTION: throat swelling  . Other Shortness Of Breath and Other (See Comments)    Local anesthesia Had asthma attack  . Other Shortness Of Breath and Other (See Comments)    Pt states a sedative agent used prior to a surgical procedure ( Pt states it "sounds like Phenergan. It should be in my records from [place].") caused an asthma attack.   . Oxycodone Other (See Comments)    sedation  . Sulfonamide Derivatives Other (See Comments)    Yeast infection  . Cinnamon Other (See Comments)    Unknown  . Feldene [Piroxicam] Nausea Only  . Lyrica [Pregabalin] Other (See Comments)    Unknown  . Phenergan [Promethazine Hcl] Other (See Comments)    triggers asthma attacks  . Sulfa Antibiotics Other (See Comments)    (Yeast infection, per patient.)  . Latex Rash  . Latex Rash and Other (See Comments)    "Blistery" rash.   .  Tape Rash and Other (See Comments)    Do not use adhesive tape; okay to use paper tape.    VACCINATION STATUS: Immunization History  Administered Date(s) Administered  . Influenza,inj,Quad PF,6+ Mos 08/26/2014  . Pneumococcal Polysaccharide-23 08/26/2014    Diabetes  She presents for her follow-up diabetic visit. She has type 2 diabetes mellitus. Onset time: She was diagnosed at approximate age of 51 years. Her disease course has been improving. There are no hypoglycemic associated symptoms. Pertinent negatives for hypoglycemia include no confusion, headaches, pallor or seizures. Associated symptoms include fatigue. Pertinent negatives for diabetes include no blurred vision, no chest pain, no polydipsia, no polyphagia and no polyuria. There are no hypoglycemic complications. Symptoms are improving. Diabetic complications include a CVA and heart disease. Risk factors for coronary artery disease include dyslipidemia, diabetes mellitus, family history, obesity, hypertension, post-menopausal and sedentary lifestyle. Current diabetic treatments: She is on Lantus 35 units nightly and Humalog 22 units 3 times daily AC. Her weight is fluctuating minimally. She is following a generally unhealthy diet. When asked about meal planning, she reported none. She has not had a previous visit with a dietitian. She never participates in exercise. Her home blood glucose trend is increasing steadily. Her breakfast blood  glucose range is generally 130-140 mg/dl. Her lunch blood glucose range is generally 130-140 mg/dl. Her dinner blood glucose range is generally 130-140 mg/dl. Her bedtime blood glucose range is generally 130-140 mg/dl. Her overall blood glucose range is 130-140 mg/dl. An ACE inhibitor/angiotensin II receptor blocker is being taken. Eye exam is current.  Hyperlipidemia  This is a chronic problem. The current episode started more than 1 year ago. The problem is controlled. Exacerbating diseases include  diabetes, hypothyroidism and obesity. Pertinent negatives include no chest pain, myalgias or shortness of breath. Current antihyperlipidemic treatment includes statins. Risk factors for coronary artery disease include family history, dyslipidemia, diabetes mellitus, hypertension, obesity, post-menopausal and a sedentary lifestyle.  Hypertension  This is a chronic problem. The current episode started more than 1 year ago. Pertinent negatives include no blurred vision, chest pain, headaches, palpitations or shortness of breath. Risk factors for coronary artery disease include dyslipidemia, diabetes mellitus, obesity, sedentary lifestyle and post-menopausal state. Past treatments include calcium channel blockers and ACE inhibitors. Hypertensive end-organ damage includes CVA.    Review of Systems  Constitutional: Positive for fatigue. Negative for chills, fever and unexpected weight change.  HENT: Negative for trouble swallowing and voice change.   Eyes: Negative for blurred vision and visual disturbance.  Respiratory: Negative for cough, shortness of breath and wheezing.   Cardiovascular: Negative for chest pain, palpitations and leg swelling.  Gastrointestinal: Negative for diarrhea, nausea and vomiting.  Endocrine: Negative for cold intolerance, heat intolerance, polydipsia, polyphagia and polyuria.  Musculoskeletal: Positive for gait problem. Negative for arthralgias and myalgias.       She walks with a cane related to her hemiparesis from stroke on the right side.  Skin: Negative for color change, pallor, rash and wound.  Neurological: Negative for seizures and headaches.  Psychiatric/Behavioral: Negative for confusion and suicidal ideas.    Objective:    BP (!) 148/82   Pulse (!) 59   Ht 5' (1.524 m)   Wt 229 lb (103.9 kg)   BMI 44.72 kg/m   Wt Readings from Last 3 Encounters:  07/31/18 229 lb (103.9 kg)  06/19/18 232 lb 14.4 oz (105.6 kg)  03/28/18 228 lb (103.4 kg)     Physical  Exam  Constitutional: She is oriented to person, place, and time. She appears well-developed.  HENT:  Head: Normocephalic and atraumatic.  Eyes: EOM are normal.  Neck: Normal range of motion. Neck supple. No tracheal deviation present. No thyromegaly present.  Cardiovascular: Normal rate.  Pulmonary/Chest: Effort normal.  Abdominal: There is no tenderness. There is no guarding.  Musculoskeletal: Normal range of motion. She exhibits no edema.  Neurological: She is alert and oriented to person, place, and time. She has normal reflexes. No cranial nerve deficit. Coordination normal.  Skin: Skin is warm and dry. No rash noted. No erythema. No pallor.  Psychiatric: She has a normal mood and affect. Judgment normal.    Lipid Panel     Component Value Date/Time   CHOL 113 03/23/2018 1258   TRIG 111 03/23/2018 1258   HDL 46 (L) 03/23/2018 1258   CHOLHDL 2.5 03/23/2018 1258   VLDL 28 04/07/2017 0603   LDLCALC 48 03/23/2018 1258      Lab Results  Component Value Date   TSH 4.14 07/27/2018   TSH 6.83 (H) 03/23/2018   TSH 2.54 09/29/2016   FREET4 1.3 07/27/2018   FREET4 1.1 03/23/2018    Recent Results (from the past 2160 hour(s))  Hepatic function panel  Status: None   Collection Time: 06/19/18  2:17 PM  Result Value Ref Range   Total Protein 6.3 6.1 - 8.1 g/dL   Albumin 3.7 3.6 - 5.1 g/dL   Globulin 2.6 1.9 - 3.7 g/dL (calc)   AG Ratio 1.4 1.0 - 2.5 (calc)   Total Bilirubin 1.1 0.2 - 1.2 mg/dL   Bilirubin, Direct 0.2 0.0 - 0.2 mg/dL   Indirect Bilirubin 0.9 0.2 - 1.2 mg/dL (calc)   Alkaline phosphatase (APISO) 84 33 - 130 U/L   AST 23 10 - 35 U/L   ALT 14 6 - 29 U/L  Sedimentation rate     Status: None   Collection Time: 06/19/18  2:17 PM  Result Value Ref Range   Sed Rate 19 0 - 30 mm/h  COMPLETE METABOLIC PANEL WITH GFR     Status: Abnormal   Collection Time: 07/27/18  9:47 AM  Result Value Ref Range   Glucose, Bld 126 (H) 65 - 99 mg/dL    Comment: .             Fasting reference interval . For someone without known diabetes, a glucose value >125 mg/dL indicates that they may have diabetes and this should be confirmed with a follow-up test. .    BUN 17 7 - 25 mg/dL   Creat 1.61 (H) 0.96 - 0.99 mg/dL    Comment: For patients >65 years of age, the reference limit for Creatinine is approximately 13% higher for people identified as African-American. .    GFR, Est Non African American 58 (L) > OR = 60 mL/min/1.31m2   GFR, Est African American 67 > OR = 60 mL/min/1.59m2   BUN/Creatinine Ratio 16 6 - 22 (calc)   Sodium 141 135 - 146 mmol/L   Potassium 3.8 3.5 - 5.3 mmol/L   Chloride 105 98 - 110 mmol/L   CO2 28 20 - 32 mmol/L   Calcium 8.9 8.6 - 10.4 mg/dL   Total Protein 6.8 6.1 - 8.1 g/dL   Albumin 4.1 3.6 - 5.1 g/dL   Globulin 2.7 1.9 - 3.7 g/dL (calc)   AG Ratio 1.5 1.0 - 2.5 (calc)   Total Bilirubin 1.7 (H) 0.2 - 1.2 mg/dL   Alkaline phosphatase (APISO) 73 33 - 130 U/L   AST 33 10 - 35 U/L   ALT 16 6 - 29 U/L  Hemoglobin A1c     Status: Abnormal   Collection Time: 07/27/18  9:47 AM  Result Value Ref Range   Hgb A1c MFr Bld 6.0 (H) <5.7 % of total Hgb    Comment: For someone without known diabetes, a hemoglobin  A1c value between 5.7% and 6.4% is consistent with prediabetes and should be confirmed with a  follow-up test. . For someone with known diabetes, a value <7% indicates that their diabetes is well controlled. A1c targets should be individualized based on duration of diabetes, age, comorbid conditions, and other considerations. . This assay result is consistent with an increased risk of diabetes. . Currently, no consensus exists regarding use of hemoglobin A1c for diagnosis of diabetes for children. .    Mean Plasma Glucose 126 (calc)   eAG (mmol/L) 7.0 (calc)  T4, free     Status: None   Collection Time: 07/27/18  9:47 AM  Result Value Ref Range   Free T4 1.3 0.8 - 1.8 ng/dL  TSH     Status: None   Collection  Time: 07/27/18  9:47 AM  Result Value Ref  Range   TSH 4.14 0.40 - 4.50 mIU/L     Assessment & Plan:   1. DM type 2 causing vascular disease (HCC)  - [name] has currently uncontrolled symptomatic type 2 DM since 62 years of age. -Returns with controlled A1c of 6%, generally improving from 11.2%.   -She has rare and random mild hypoglycemia.  -  Recent labs reviewed.  -her diabetes is complicated by CVA with right sided hemiparesis, obesity/sedentary life , congestive heart failure and LATRESA GASSER remains at a high risk for more acute and chronic complications which include CAD, CVA, CKD, retinopathy, and neuropathy. These are all discussed in detail with the patient.  - I have counseled her on diet management and weight loss, by adopting a carbohydrate restricted/protein rich diet.  -  Suggestion is made for her to avoid simple carbohydrates  from her diet including Cakes, Sweet Desserts / Pastries, Ice Cream, Soda (diet and regular), Sweet Tea, Candies, Chips, Cookies, Store Bought Juices, Alcohol in Excess of  1-2 drinks a day, Artificial Sweeteners, and "Sugar-free" Products. This will help patient to have stable blood glucose profile and potentially avoid unintended weight gain.  - I encouraged her to switch to  unprocessed or minimally processed complex starch and increased protein intake (animal or plant source), fruits, and vegetables.  - she is advised to stick to a routine mealtimes to eat 3 meals  a day and avoid unnecessary snacks ( to snack only to correct hypoglycemia).   - she is scheduled with Norm Salt, RDN, CDE for individualized diabetes education.  - I have approached her with the following individualized plan to manage diabetes and patient agrees:   - I advised her to continue  Lantus 50 units nightly, advised her to lower Humalog to 10 units  3 times a day with meals  for pre-meal blood glucose  readings of 90-150mg /dl, plus patient specific  correction dose for unexpected hyperglycemia above 150mg /dl, associated with strict monitoring of glucose 4 times a day-before meals and at bedtime. - Patient is warned not to take insulin without proper monitoring per orders. -Adjustment parameters are given for hypo and hyperglycemia in writing. -Her insurance did not provide adequate coverage for the CGM device, decided not to get it. -Patient is encouraged to call clinic for blood glucose levels less than 70 or above 300 mg /dl. -She reports intolerance to Victoza and metformin.   - Patient specific target  A1c;  LDL, HDL, Triglycerides, and  Waist Circumference were discussed in detail.  2) BP/HTN: Her blood pressure is not controlled to target.  She is advised to continue on lisinopril and amlodipine.   3) Lipids/HPL: Her recent lipid panel showed controlled LDL at 84.  Patient is not on statins.  She reports multiple allergies to medications.   4)  Weight/Diet: CDE Consult will be initiated , exercise, and detailed carbohydrates information provided.  She would benefit from bariatric surgery.  I discussed and gave her contact information about with the long bariatric team.   5.  Hypothyroidism: Etiology unclear, patient denies any history of thyroidectomy nor radioactive iodine thyroid ablation. -Based on her thyroid function tests, she would benefit from slight increase in her levothyroxine.  I discussed and increase her levothyroxine to 100 mcg p.o. every morning.      - We discussed about correct intake of levothyroxine, at fasting, with water, separated by at least 30 minutes from breakfast, and separated by more than 4 hours from calcium,  iron, multivitamins, acid reflux medications (PPIs). -Patient is made aware of the fact that thyroid hormone replacement is needed for life, dose to be adjusted by periodic monitoring of thyroid function tests.  6) Chronic Care/Health Maintenance:  -she  is on lisinopril and  is encouraged to  continue to follow up with Ophthalmology, Dentist,  Podiatrist at least yearly or according to recommendations, and advised to  stay away from smoking. I have recommended yearly flu vaccine and pneumonia vaccination at least every 5 years; moderate intensity exercise for up to 150 minutes weekly; and  sleep for at least 7 hours a day.  - I advised patient to maintain close follow up with Ignatius Specking, MD for primary care needs.  - Time spent with the patient: 25 min, of which >50% was spent in reviewing her blood glucose logs , discussing her hypo- and hyper-glycemic episodes, reviewing her current and  previous labs and insulin doses and developing a plan to avoid hypo- and hyper-glycemia. Please refer to Patient Instructions for Blood Glucose Monitoring and Insulin/Medications Dosing Guide"  in media tab for additional information. [name] participated in the discussions, expressed understanding, and voiced agreement with the above plans.  All questions were answered to her satisfaction. she is encouraged to contact clinic should she have any questions or concerns prior to her return visit.   Follow up plan: - Return in about 3 months (around 10/30/2018) for Follow up with Pre-visit Labs, Meter, and Logs.  Marquis Lunch, MD [facility] [address] [place], [place] [address] Phone: [phone]  Fax: [phone]    07/31/2018, 10:57 AM  This note was partially dictated with voice recognition software. Similar sounding words can be transcribed inadequately or may not  be corrected upon review.

## 2018-10-28 LAB — COMPLETE METABOLIC PANEL WITH GFR
AG Ratio: 1.4 (calc) (ref 1.0–2.5)
ALBUMIN MSPROF: 4 g/dL (ref 3.6–5.1)
ALT: 18 U/L (ref 6–29)
AST: 34 U/L (ref 10–35)
Alkaline phosphatase (APISO): 74 U/L (ref 33–130)
BUN: 16 mg/dL (ref 7–25)
CALCIUM: 9.5 mg/dL (ref 8.6–10.4)
CHLORIDE: 106 mmol/L (ref 98–110)
CO2: 30 mmol/L (ref 20–32)
CREATININE: 0.85 mg/dL (ref 0.50–0.99)
GFR, EST AFRICAN AMERICAN: 86 mL/min/{1.73_m2} (ref >=60)
GFR, Est Non African American: 74 mL/min/{1.73_m2} (ref >=60)
GLUCOSE: 94 mg/dL (ref 65–99)
Globulin: 2.9 g/dL (calc) (ref 1.9–3.7)
Potassium: 4 mmol/L (ref 3.5–5.3)
Sodium: 142 mmol/L (ref 135–146)
TOTAL PROTEIN: 6.9 g/dL (ref 6.1–8.1)
Total Bilirubin: 1.2 mg/dL (ref 0.2–1.2)

## 2018-10-28 LAB — HEMOGLOBIN A1C
EAG (MMOL/L): 6.2 (calc)
Hgb A1c MFr Bld: 5.5 % of total Hgb (ref <5.7)
Mean Plasma Glucose: 111 (calc)

## 2018-10-28 LAB — TSH: TSH: 2.3 mIU/L (ref 0.40–4.50)

## 2018-10-28 LAB — T4, FREE: FREE T4: 1.2 ng/dL (ref 0.8–1.8)

## 2018-10-30 ENCOUNTER — Encounter: Payer: Self-pay | Admitting: "Endocrinology

## 2018-10-30 ENCOUNTER — Ambulatory Visit (INDEPENDENT_AMBULATORY_CARE_PROVIDER_SITE_OTHER): Payer: Self-pay | Admitting: "Endocrinology

## 2018-10-30 VITALS — BP 139/73 | HR 70 | Ht 60.0 in | Wt 232.0 lb

## 2018-10-30 DIAGNOSIS — I10 Essential (primary) hypertension: Secondary | ICD-10-CM

## 2018-10-30 DIAGNOSIS — E1159 Type 2 diabetes mellitus with other circulatory complications: Secondary | ICD-10-CM

## 2018-10-30 DIAGNOSIS — E039 Hypothyroidism, unspecified: Secondary | ICD-10-CM

## 2018-10-30 DIAGNOSIS — E782 Mixed hyperlipidemia: Secondary | ICD-10-CM

## 2018-10-30 MED ORDER — INSULIN LISPRO 200 UNIT/ML ~~LOC~~ SOPN: 5.0000 [IU] | PEN_INJECTOR | Freq: Three times a day (TID) | SUBCUTANEOUS | 2 refills | Status: DC

## 2018-10-30 MED ORDER — INSULIN GLARGINE (1 UNIT DIAL) 300 UNIT/ML ~~LOC~~ SOPN: 40.0000 [IU] | PEN_INJECTOR | Freq: Every day | SUBCUTANEOUS | 2 refills | Status: DC

## 2018-10-30 NOTE — Progress Notes (Signed)
Endocrinology follow-up note       10/30/2018, 1:43 PM   Subjective:    Patient ID: Kimberly [name], female    DOB: Apr 02, 1956.  Kimberly [name] is being seen in follow-up for management of currently uncontrolled symptomatic type 2 diabetes, hypothyroidism, hyperlipidemia, hypertension,. -PMD:   Ignatius Specking, MD.   Past Medical History:  Diagnosis Date  . Anemia    PMH: as a child  . Arthritis   . Asthma   . CAD (coronary artery disease)    nonobstructive by cath, 6/08 (false positive Cardiolite) normal stress echo, 7/11  . CHF (congestive heart failure) (HCC)   . Chronic back pain   . Chronic bronchitis (HCC)   . Complication of anesthesia    had an asthma attack when woke up from procedure  . Complication of anesthesia    asthma attack with one surgery  . COPD (chronic obstructive pulmonary disease) (HCC)   . Degenerative joint disease   . Depression   . Diabetes mellitus without complication (HCC)   . DJD (degenerative joint disease)   . Fatty liver   . Fibromyalgia   . GERD (gastroesophageal reflux disease)   . Glaucoma   . Headache(784.0)   . Heart failure, diastolic, chronic (HCC)   . Heart murmur    PMH:As a child only  . Hernia of abdominal cavity    "upper and lower hernia"  . History of hiatal hernia   . HTN (hypertension)   . Hypertension   . Hypothyroidism   . IBS (irritable bowel syndrome)   . IDDM (insulin dependent diabetes mellitus) (HCC)   . Morbid obesity (HCC)   . Neuropathy    associated with diabetes  . Obstructive sleep apnea   . Pancreatitis   . Pinched nerve in neck   . Pneumonia    as a child  . Pulmonary hypertension (HCC)   . Rheumatic fever    PMH: as a child  . Seizures (HCC)    PMH: only as a child  . Seizures (HCC)    in childhood  . Shortness of breath   . Sleep apnea   . Spinal stenosis   . Stroke [facility])    12/09/2013   Past Surgical  History:  Procedure Laterality Date  . ABDOMINAL HYSTERECTOMY    . ABDOMINAL HYSTERECTOMY     partial hysterectomy  . APPENDECTOMY    . BACK SURGERY    . BACK SURGERY     disc surgery with complications and damage to right side  . BILATERAL KNEE ARTHROSCOPY  2000, 2009  . CARDIAC CATHETERIZATION     2009  . CATARACT EXTRACTION W/ INTRAOCULAR LENS  IMPLANT, BILATERAL    . CHOLECYSTECTOMY  1994  . CHOLECYSTECTOMY    . COLONOSCOPY N/A 08/01/2013   Procedure: COLONOSCOPY;  Surgeon: Malissa Hippo, MD;  Location: [facility];  Service: Endoscopy;  Laterality: N/A;  930  . COLONOSCOPY N/A 08/03/2017   Procedure: COLONOSCOPY;  Surgeon: Malissa Hippo, MD;  Location: [facility];  Service: Endoscopy;  Laterality: N/A;  12:00  . CYST EXCISION  Left breast  . EXCISIONAL HEMORRHOIDECTOMY    . EYE SURGERY  1967  . EYE SURGERY     bilateral cataract removal with lens implants  . EYE SURGERY     growth removed from right eye lid  . EYE SURGERY     bilateral eye surgery age 62 t"to straighten eyes'  . heel (other)  2005  . HEEL SPUR SURGERY     Right heel - growth removal and rebuilding of heel  . HEMORRHOID SURGERY    . HERNIA REPAIR    . KNEE ARTHROSCOPY Bilateral   . LUMBAR LAMINECTOMY/DECOMPRESSION MICRODISCECTOMY Right 08/23/2014   Procedure: LUMBAR LAMINECTOMY/DECOMPRESSION MICRODISCECTOMY 1 LEVEL;  Surgeon: Temple Pacini, MD;  Location: [facility];  Service: Neurosurgery;  Laterality: Right;  LUMBAR LAMINECTOMY/DECOMPRESSION MICRODISCECTOMY 1 LEVEL LUMBAR 2-3  . ovaries removed  2003  . PARTIAL HYSTERECTOMY  1981  . SPHINCTEROTOMY    . spinahatomy  1992  . TUBAL LIGATION  1975  . TUBAL LIGATION     Social History   Socioeconomic History  . Marital status: Married    Spouse name: Not on file  . Number of children: Not on file  . Years of education: Not on file  . Highest education level: Not on file  Occupational History  . Not on file  Social Needs  . Financial  resource strain: Not on file  . Food insecurity:    Worry: Not on file    Inability: Not on file  . Transportation needs:    Medical: Not on file    Non-medical: Not on file  Tobacco Use  . Smoking status: Never Smoker  . Smokeless tobacco: Never Used  Substance and Sexual Activity  . Alcohol use: No  . Drug use: No  . Sexual activity: Not on file  Lifestyle  . Physical activity:    Days per week: Not on file    Minutes per session: Not on file  . Stress: Not on file  Relationships  . Social connections:    Talks on phone: Not on file    Gets together: Not on file    Attends religious service: Not on file    Active member of club or organization: Not on file    Attends meetings of clubs or organizations: Not on file    Relationship status: Not on file  Other Topics Concern  . Not on file  Social History Narrative   ** Merged History Encounter **       Regularly exercises. Full Time.    Outpatient Encounter Medications as of 10/30/2018  Medication Sig  . acetaminophen (TYLENOL) 325 MG tablet Take 2 tablets (650 mg total) by mouth every 6 (six) hours as needed for mild pain (or Fever >/= 101).  Marland Kitchen albuterol (PROVENTIL) (2.5 MG/3ML) 0.083% nebulizer solution Inhale 2.5 mg into the lungs every 6 (six) hours as needed for wheezing or shortness of breath.   Marland Kitchen amLODipine (NORVASC) 2.5 MG tablet Take 2.5 mg by mouth daily.   Marland Kitchen aspirin EC 81 MG tablet Take 81 mg by mouth daily.  . carvedilol (COREG) 12.5 MG tablet Take 6.25 mg by mouth 2 (two) times daily with a meal.   . DULoxetine (CYMBALTA) 60 MG capsule Take 60 mg by mouth daily.   . famotidine (PEPCID) 40 MG tablet Take 40 mg by mouth every morning.  . furosemide (LASIX) 20 MG tablet Take 20 mg by mouth daily.   . Insulin Glargine, 1 Unit Dial, (  TOUJEO SOLOSTAR) 300 UNIT/ML SOPN Inject 40 Units into the skin at bedtime.  . Insulin Lispro (HUMALOG KWIKPEN) 200 UNIT/ML SOPN Inject 5-11 Units into the skin 3 (three) times daily  before meals.  Marland Kitchen levothyroxine (SYNTHROID, LEVOTHROID) 100 MCG tablet Take 1 tablet (100 mcg total) by mouth daily before breakfast.  . lisinopril (PRINIVIL,ZESTRIL) 20 MG tablet Take 20 mg by mouth daily.   . potassium chloride (K-DUR) 10 MEQ tablet Take 5 mEq by mouth 2 (two) times daily.   . traMADol (ULTRAM) 50 MG tablet Take 50 mg by mouth every 6 (six) hours as needed for moderate pain.   . [DISCONTINUED] Insulin Lispro (HUMALOG KWIKPEN) 200 UNIT/ML SOPN Inject 10-16 Units into the skin 3 (three) times daily before meals.  . [DISCONTINUED] TOUJEO SOLOSTAR 300 UNIT/ML SOPN inject 50 units INTO THE SKIN AT BEDTIME   No facility-administered encounter medications on file as of 10/30/2018.     ALLERGIES: Allergies  Allergen Reactions  . Hydrocodone Other (See Comments)    sedation  . Naproxen Anaphylaxis and Other (See Comments)    REACTION: throat swelling  . Other Shortness Of Breath and Other (See Comments)    Local anesthesia Had asthma attack  . Other Shortness Of Breath and Other (See Comments)    Pt states a sedative agent used prior to a surgical procedure ( Pt states it "sounds like Phenergan. It should be in my records from [place].") caused an asthma attack.   . Oxycodone Other (See Comments)    sedation  . Sulfonamide Derivatives Other (See Comments)    Yeast infection  . Cinnamon Other (See Comments)    Unknown  . Feldene [Piroxicam] Nausea Only  . Lyrica [Pregabalin] Other (See Comments)    Unknown  . Phenergan [Promethazine Hcl] Other (See Comments)    triggers asthma attacks  . Sulfa Antibiotics Other (See Comments)    (Yeast infection, per patient.)  . Latex Rash  . Latex Rash and Other (See Comments)    "Blistery" rash.   . Tape Rash and Other (See Comments)    Do not use adhesive tape; okay to use paper tape.    VACCINATION STATUS: Immunization History  Administered Date(s) Administered  . Influenza,inj,Quad PF,6+ Mos 08/26/2014  . Pneumococcal  Polysaccharide-23 08/26/2014    Diabetes  She presents for her follow-up diabetic visit. She has type 2 diabetes mellitus. Onset time: She was diagnosed at approximate age of 51 years. Her disease course has been improving. There are no hypoglycemic associated symptoms. Pertinent negatives for hypoglycemia include no confusion, headaches, pallor or seizures. Associated symptoms include fatigue. Pertinent negatives for diabetes include no blurred vision, no chest pain, no polydipsia, no polyphagia and no polyuria. There are no hypoglycemic complications. Symptoms are improving. Diabetic complications include a CVA and heart disease. Risk factors for coronary artery disease include dyslipidemia, diabetes mellitus, family history, obesity, hypertension, post-menopausal and sedentary lifestyle. Current diabetic treatments: She is on Lantus 35 units nightly and Humalog 22 units 3 times daily AC. Her weight is fluctuating minimally. She is following a generally unhealthy diet. When asked about meal planning, she reported none. She has not had a previous visit with a dietitian. She never participates in exercise. Her home blood glucose trend is increasing steadily. Her breakfast blood glucose range is generally 110-130 mg/dl. Her lunch blood glucose range is generally 130-140 mg/dl. Her dinner blood glucose range is generally 130-140 mg/dl. Her bedtime blood glucose range is generally 130-140 mg/dl. Her overall blood glucose  range is 130-140 mg/dl. An ACE inhibitor/angiotensin II receptor blocker is being taken. Eye exam is current.  Hyperlipidemia  This is a chronic problem. The current episode started more than 1 year ago. The problem is controlled. Exacerbating diseases include diabetes, hypothyroidism and obesity. Pertinent negatives include no chest pain, myalgias or shortness of breath. Current antihyperlipidemic treatment includes statins. Risk factors for coronary artery disease include family history,  dyslipidemia, diabetes mellitus, hypertension, obesity, post-menopausal and a sedentary lifestyle.  Hypertension  This is a chronic problem. The current episode started more than 1 year ago. Pertinent negatives include no blurred vision, chest pain, headaches, palpitations or shortness of breath. Risk factors for coronary artery disease include dyslipidemia, diabetes mellitus, obesity, sedentary lifestyle and post-menopausal state. Past treatments include calcium channel blockers and ACE inhibitors. Hypertensive end-organ damage includes CVA.    Review of Systems  Constitutional: Positive for fatigue. Negative for chills, fever and unexpected weight change.  HENT: Negative for trouble swallowing and voice change.   Eyes: Negative for blurred vision and visual disturbance.  Respiratory: Negative for cough, shortness of breath and wheezing.   Cardiovascular: Negative for chest pain, palpitations and leg swelling.  Gastrointestinal: Negative for diarrhea, nausea and vomiting.  Endocrine: Negative for cold intolerance, heat intolerance, polydipsia, polyphagia and polyuria.  Musculoskeletal: Positive for gait problem. Negative for arthralgias and myalgias.       She walks with a cane related to her hemiparesis from stroke on the right side.  Skin: Negative for color change, pallor, rash and wound.  Neurological: Negative for seizures and headaches.  Psychiatric/Behavioral: Negative for confusion and suicidal ideas.    Objective:    BP 139/73   Pulse 70   Ht 5' (1.524 m)   Wt 232 lb (105.2 kg)   BMI 45.31 kg/m   Wt Readings from Last 3 Encounters:  10/30/18 232 lb (105.2 kg)  07/31/18 229 lb (103.9 kg)  06/19/18 232 lb 14.4 oz (105.6 kg)     Physical Exam  Constitutional: She is oriented to person, place, and time. She appears well-developed.  HENT:  Head: Normocephalic and atraumatic.  Eyes: EOM are normal.  Neck: Normal range of motion. Neck supple. No tracheal deviation present.  No thyromegaly present.  Cardiovascular: Normal rate.  Pulmonary/Chest: Effort normal.  Abdominal: There is no tenderness. There is no guarding.  Musculoskeletal: Normal range of motion. She exhibits no edema.  Neurological: She is alert and oriented to person, place, and time. She has normal reflexes. No cranial nerve deficit. Coordination normal.  Skin: Skin is warm and dry. No rash noted. No erythema. No pallor.  Psychiatric: She has a normal mood and affect. Judgment normal.    Lipid Panel     Component Value Date/Time   CHOL 113 03/23/2018 1258   TRIG 111 03/23/2018 1258   HDL 46 (L) 03/23/2018 1258   CHOLHDL 2.5 03/23/2018 1258   VLDL 28 04/07/2017 0603   LDLCALC 48 03/23/2018 1258      Lab Results  Component Value Date   TSH 2.30 10/27/2018   TSH 4.14 07/27/2018   TSH 6.83 (H) 03/23/2018   TSH 2.54 09/29/2016   FREET4 1.2 10/27/2018   FREET4 1.3 07/27/2018   FREET4 1.1 03/23/2018    Recent Results (from the past 2160 hour(s))  Hemoglobin A1c     Status: None   Collection Time: 10/27/18 12:00 AM  Result Value Ref Range   Hgb A1c MFr Bld 5.5 <5.7 % of total Hgb  Comment: For the purpose of screening for the presence of diabetes: . <5.7%       Consistent with the absence of diabetes 5.7-6.4%    Consistent with increased risk for diabetes             (prediabetes) > or =6.5%  Consistent with diabetes . This assay result is consistent with a decreased risk of diabetes. . Currently, no consensus exists regarding use of hemoglobin A1c for diagnosis of diabetes in children. . According to American Diabetes Association (ADA) guidelines, hemoglobin A1c <7.0% represents optimal control in non-pregnant diabetic patients. Different metrics may apply to specific patient populations.  Standards of Medical Care in Diabetes(ADA). .    Mean Plasma Glucose 111 (calc)   eAG (mmol/L) 6.2 (calc)  COMPLETE METABOLIC PANEL WITH GFR     Status: None   Collection Time:  10/27/18 12:00 AM  Result Value Ref Range   Glucose, Bld 94 65 - 99 mg/dL    Comment: .            Fasting reference interval .    BUN 16 7 - 25 mg/dL   Creat 6.960.85 2.950.50 - 2.840.99 mg/dL    Comment: For patients >61 years of age, the reference limit for Creatinine is approximately 13% higher for people identified as African-American. .    GFR, Est Non African American 74 > OR = 60 mL/min/1.873m2   GFR, Est African American 86 > OR = 60 mL/min/1.7573m2   BUN/Creatinine Ratio NOT APPLICABLE 6 - 22 (calc)   Sodium 142 135 - 146 mmol/L   Potassium 4.0 3.5 - 5.3 mmol/L   Chloride 106 98 - 110 mmol/L   CO2 30 20 - 32 mmol/L   Calcium 9.5 8.6 - 10.4 mg/dL   Total Protein 6.9 6.1 - 8.1 g/dL   Albumin 4.0 3.6 - 5.1 g/dL   Globulin 2.9 1.9 - 3.7 g/dL (calc)   AG Ratio 1.4 1.0 - 2.5 (calc)   Total Bilirubin 1.2 0.2 - 1.2 mg/dL   Alkaline phosphatase (APISO) 74 33 - 130 U/L   AST 34 10 - 35 U/L   ALT 18 6 - 29 U/L  T4, free     Status: None   Collection Time: 10/27/18 12:00 AM  Result Value Ref Range   Free T4 1.2 0.8 - 1.8 ng/dL  TSH     Status: None   Collection Time: 10/27/18 12:00 AM  Result Value Ref Range   TSH 2.30 0.40 - 4.50 mIU/L     Assessment & Plan:   1. DM type 2 causing vascular disease (HCC)  - Kimberly [name] has currently uncontrolled symptomatic type 2 DM since 62 years of age. -Returns with controlled A1c of 5.5%, generally improving from 11.2%.    -She has rare and random mild hypoglycemia.  -  Recent labs reviewed.  -her diabetes is complicated by CVA with right sided hemiparesis, obesity/sedentary life , congestive heart failure and Kimberly [name] remains at a high risk for more acute and chronic complications which include CAD, CVA, CKD, retinopathy, and neuropathy. These are all discussed in detail with the patient.  - I have counseled her on diet management and weight loss, by adopting a carbohydrate restricted/protein rich diet.  -  Suggestion is  made for her to avoid simple carbohydrates  from her diet including Cakes, Sweet Desserts / Pastries, Ice Cream, Soda (diet and regular), Sweet Tea, Candies, Chips, Cookies, Store Bought Juices, Alcohol in Excess of  1-2 drinks a day, Artificial Sweeteners, and "Sugar-free" Products. This will help patient to have stable blood glucose profile and potentially avoid unintended weight gain.   - I encouraged her to switch to  unprocessed or minimally processed complex starch and increased protein intake (animal or plant source), fruits, and vegetables.  - she is advised to stick to a routine mealtimes to eat 3 meals  a day and avoid unnecessary snacks ( to snack only to correct hypoglycemia).   - she is scheduled with Norm Salt, RDN, CDE for individualized diabetes education.  - I have approached her with the following individualized plan to manage diabetes and patient agrees:   -Given her presentation with tight glycemic profile, I approach her to lower her insulin doses as follows. She is advised to lower her Lantus to 40  units nightly, advised her to lower Humalog to 5 units  3 times a day with meals  for pre-meal blood glucose  readings of 90-150mg /dl, plus patient specific correction dose for unexpected hyperglycemia above 150mg /dl, associated with strict monitoring of glucose 4 times a day-before meals and at bedtime. - Patient is warned not to take insulin without proper monitoring per orders. -Adjustment parameters are given for hypo and hyperglycemia in writing. -Her insurance did not provide adequate coverage for the CGM device, decided not to get it. -Patient is encouraged to call clinic for blood glucose levels less than 70 or above 300 mg /dl. -She reports intolerance to Victoza and metformin.   - Patient specific target  A1c;  LDL, HDL, Triglycerides, and  Waist Circumference were discussed in detail.  2) BP/HTN: Her blood pressure is controlled to target.    She is advised to  continue on lisinopril and amlodipine.   3) Lipids/HPL: Her recent lipid panel showed controlled LDL at 84.  Patient is not on statins.  She reports multiple allergies to medications.   4)  Weight/Diet: CDE Consult will be initiated , exercise, and detailed carbohydrates information provided.  She would benefit from bariatric surgery.  I discussed and gave her contact information about with the long bariatric team.   5.  Hypothyroidism: Etiology unclear, patient denies any history of thyroidectomy nor radioactive iodine thyroid ablation. -Based on her thyroid function tests, she would benefit from slight increase in her levothyroxine.  I discussed and increase her levothyroxine to 100 mcg p.o. every morning.      - We discussed about correct intake of levothyroxine, at fasting, with water, separated by at least 30 minutes from breakfast, and separated by more than 4 hours from calcium, iron, multivitamins, acid reflux medications (PPIs). -Patient is made aware of the fact that thyroid hormone replacement is needed for life, dose to be adjusted by periodic monitoring of thyroid function tests. 6) Chronic Care/Health Maintenance:  -she  is on lisinopril and  is encouraged to continue to follow up with Ophthalmology, Dentist,  Podiatrist at least yearly or according to recommendations, and advised to  stay away from smoking. I have recommended yearly flu vaccine and pneumonia vaccination at least every 5 years; moderate intensity exercise for up to 150 minutes weekly; and  sleep for at least 7 hours a day.  - I advised patient to maintain close follow up with Ignatius Specking, MD for primary care needs.  - Time spent with the patient: 25 min, of which >50% was spent in reviewing her blood glucose logs , discussing her hypo- and hyper-glycemic episodes, reviewing her current and  previous labs  and insulin doses and developing a plan to avoid hypo- and hyper-glycemia. Please refer to Patient Instructions  for Blood Glucose Monitoring and Insulin/Medications Dosing Guide"  in media tab for additional information. Kimberly Camel participated in the discussions, expressed understanding, and voiced agreement with the above plans.  All questions were answered to her satisfaction. she is encouraged to contact clinic should she have any questions or concerns prior to her return visit.    Follow up plan: - Return in about 4 months (around 03/01/2019) for Follow up with Pre-visit Labs, Meter, and Logs.  Marquis Lunch, MD [facility] [address] [place], [place] [address] Phone: [phone]  Fax: [phone]    10/30/2018, 1:43 PM  This note was partially dictated with voice recognition software. Similar sounding words can be transcribed inadequately or may not  be corrected upon review.

## 2018-10-30 NOTE — Patient Instructions (Signed)

## 2018-11-26 ENCOUNTER — Other Ambulatory Visit: Payer: Self-pay | Admitting: "Endocrinology

## 2018-12-07 ENCOUNTER — Encounter (HOSPITAL_COMMUNITY): Payer: Self-pay | Admitting: Psychiatry

## 2018-12-07 ENCOUNTER — Ambulatory Visit (INDEPENDENT_AMBULATORY_CARE_PROVIDER_SITE_OTHER): Payer: Medicare Other | Admitting: Psychiatry

## 2018-12-07 DIAGNOSIS — F4323 Adjustment disorder with mixed anxiety and depressed mood: Secondary | ICD-10-CM
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
## 2018-12-19 ENCOUNTER — Ambulatory Visit (INDEPENDENT_AMBULATORY_CARE_PROVIDER_SITE_OTHER): Payer: Medicare Other | Admitting: Psychiatry

## 2018-12-19 DIAGNOSIS — F4323 Adjustment disorder with mixed anxiety and depressed mood: Secondary | ICD-10-CM | POA: Diagnosis not present
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
## 2018-12-20 ENCOUNTER — Encounter (HOSPITAL_COMMUNITY): Payer: Self-pay | Admitting: Psychiatry

## 2019-01-02 ENCOUNTER — Ambulatory Visit (INDEPENDENT_AMBULATORY_CARE_PROVIDER_SITE_OTHER): Payer: Medicare Other | Admitting: Psychiatry

## 2019-01-02 ENCOUNTER — Encounter (HOSPITAL_COMMUNITY): Payer: Self-pay | Admitting: Psychiatry

## 2019-01-02 DIAGNOSIS — F4323 Adjustment disorder with mixed anxiety and depressed mood: Secondary | ICD-10-CM | POA: Diagnosis not present

## 2019-01-04 NOTE — Progress Notes (Signed)
Client: Kimberly [name]  Date: 01/02/19  Time: 12:25-1:23pm  Type of Therapy: Individual Therapy  Diagnosis:?Axis I: Adjustment D/O, mixed anxiety and depressed mood  Treatment goals addressed: Kimberly [name] would like to emotionally and cognitively prepare for her upcoming Weight Loss Surgery by learning healthy coping skills to control her thoughts and behaviors around emotional eating.  Interventions: CBT, Motivational Interviewing, Psychoeducation, Cognitive Coping Skill Building  Summary: Client , Kimberly [name] female who presents with Adjustment Disorder, due to recent lifestyle changes/plan. Counselor using therapeutic interventions to address anticipation/anxiety/past traumas and to prepare for upcoming weight loss surgery and recovery.  Therapist Response: Kimberly [name] met with counselor for individual therapy. Counselor joined with [employer] as she shared about recent sickness/procedure, updates on WLS process, coping skill practice, relationships, and unresolved issues in her past. Counselor assessed current psychiatric symptoms and life stressors with the [place]. Kimberly [name] reported that she has mainly not felt well due to "being sick", denying any increased anxiety or depressive symptoms. She reported that since our last session, she has not experienced the troubling dreams and daydreams, which has been a relief. Counselor explored her support system within the [facility] community, with Kimberly [name] reporting that she is making attempts but transportation is her main issue. She is looking into a virtual support group through [facility]. Counselor explored her support within her family and Kimberly [name] shared more on her relationship with her husband, sons and grandchildren. She reports that there are strong connections in ways, but she has a desire to improve those relationships. Counselor prompted Kimberly [name] to share ways she can implement self-care this week and connect with others. Kimberly [name] was able to identify ways. Counselor praised her for  progress and participation in therapy.  Suicidal/Homicidal: No current safety concerns. No plan/intent to harm self or others.  Plan: To return in 1 week. Will apply skills learned in session at home, until next session.  ?  Lise Auer, LCSW

## 2019-01-16 ENCOUNTER — Ambulatory Visit (INDEPENDENT_AMBULATORY_CARE_PROVIDER_SITE_OTHER): Payer: Medicare Other | Admitting: Psychiatry

## 2019-01-16 DIAGNOSIS — F4323 Adjustment disorder with mixed anxiety and depressed mood: Secondary | ICD-10-CM

## 2019-01-18 ENCOUNTER — Encounter (HOSPITAL_COMMUNITY): Payer: Self-pay | Admitting: Psychiatry

## 2019-01-18 NOTE — Progress Notes (Signed)
Client: Kimberly [name]  Date: 01/16/19  Time: 10:02-10:57pm  Type of Therapy: Individual Therapy  Diagnosis:?Axis I: Adjustment D/O, mixed anxiety and depressed mood  Treatment goals addressed: [name] would like to emotionally and cognitively prepare for her upcoming Weight Loss Surgery by learning healthy coping skills to control her thoughts and behaviors around emotional eating.  Interventions: CBT, Motivational Interviewing, Psychoeducation, Cognitive Coping Skill Building  Summary: Client , [name] female who presents with Adjustment Disorder, due to recent lifestyle changes/plan. Counselor using therapeutic interventions to address anticipation/anxiety/past traumas and to prepare for upcoming weight loss surgery and recovery.  Therapist Response: [name] met with counselor for individual therapy. Counselor joined with [employer] as she shared about progress towards WLS, familial supports, familial concerns and positive behavior changes. Counselor assessed current psychiatric symptoms and life stressors with the [place]. [name] reported that the past 2 weeks have been better for her health and mental health wise, as she is finally over the stomach bug and colds. She reports minimal issues with emotional eating and has been consistent with self-control with food intake. Counselor assessed her support system to determine who will be helping in her recovery. [name] was able to identify several people who will be involved in her recovery and we determined who would not be positive or helpful to be around post-surgery. [name] shared about a sister who is likely to pass within the next few months. Counselor processed grief and loss issues around this relationship. [name] overall is excited that she is getting closer to being given her surgery date and is looking forward, yet has mixed emotions about what life will be life post-op. She is hopeful this will be a positive step towards a healthier and more active  lifestyle for her.  Suicidal/Homicidal: No current safety concerns. No plan/intent to harm self or others.  Plan: To return in 1 week. Will apply skills learned in session at home, until next session.  ?  Lise Auer, LCSW

## 2019-01-30 ENCOUNTER — Encounter (HOSPITAL_COMMUNITY): Payer: Self-pay | Admitting: Psychiatry

## 2019-01-30 ENCOUNTER — Ambulatory Visit (INDEPENDENT_AMBULATORY_CARE_PROVIDER_SITE_OTHER): Payer: Medicare Other | Admitting: Psychiatry

## 2019-01-30 DIAGNOSIS — F4323 Adjustment disorder with mixed anxiety and depressed mood: Secondary | ICD-10-CM

## 2019-01-30 NOTE — Progress Notes (Signed)
Client: Kimberly [name]  Date: 01/30/19  Time: 9:07-10:00am  Type of Therapy: Individual Therapy  Diagnosis:?Axis I: Adjustment D/O, mixed anxiety and depressed mood  Treatment goals addressed: Kimberly [name] would like to emotionally and cognitively prepare for her upcoming Weight Loss Surgery by learning healthy coping skills to control her thoughts and behaviors around emotional eating.  Interventions: CBT, Motivational Interviewing, Psychoeducation, Cognitive Coping Skill Building  Summary: Client , [name] female who presents with Adjustment Disorder, due to recent lifestyle changes/plan. Counselor using therapeutic interventions to address anticipation/anxiety/past traumas and to prepare for upcoming weight loss surgery and recovery.  Therapist Response: Kimberly [name] met with counselor for individual therapy. Counselor joined with Kimberly [name] as she excitedly reported that her official weight loss surgery date will be April 6 and how she is actively preparing for it mentally and physically. Counselor assessed current psychiatric symptoms and life stressors with [name]. [name] denied any depressive symptoms, but shared that she is experiencing mild anxiety. Counselor assessed utilization of coping skills to help manage her emotions going into the surgery. Counselor praised [name] for practicing healthy cognitive coping skills and intentionally focusing on positive energy and avoiding negative people. Counselor praised [name] for all the hard work she has done up to this point to be ready for the surgery. Counselor and Kimberly [name] assessed her support system to determine who will be there to support her and her husband next month. [name] shared about relationship dynamics and feels confident, in that she has natural supports and will be connected with support groups post-surgery. We made a plan to continue meeting every other week, and will be on standby if any have to be cancelled due to recovery time.   Suicidal/Homicidal: No current safety concerns. No plan/intent to harm self or others.  Plan: To return in 2 weeks. Will apply skills learned in session at home, until next session.  ?  Lise Auer, LCSW

## 2019-02-06 ENCOUNTER — Telehealth: Payer: Self-pay | Admitting: *Deleted

## 2019-02-06 NOTE — Telephone Encounter (Signed)
We need 3 days  worth of blood glucose readings to give her a reasonable adjustment in her insulin doses.  In the meantime, she may lower her Lantus to 30 units nightly.

## 2019-02-06 NOTE — Telephone Encounter (Signed)
Patient called in said she was doing the liver shrinking diet and she wanted to know if she should adjust her insulin and bp meds due to the diet as she didn't want her sugar to fall to low on the diet her sugar was 62 this morning

## 2019-02-06 NOTE — Telephone Encounter (Signed)
Called pt she said she would call back with three days worth of readings

## 2019-02-13 ENCOUNTER — Ambulatory Visit (HOSPITAL_COMMUNITY): Payer: Medicare Other | Admitting: Psychiatry

## 2019-02-26 ENCOUNTER — Ambulatory Visit (INDEPENDENT_AMBULATORY_CARE_PROVIDER_SITE_OTHER): Payer: Medicare Other | Admitting: Psychiatry

## 2019-02-26 ENCOUNTER — Encounter (HOSPITAL_COMMUNITY): Payer: Self-pay | Admitting: Psychiatry

## 2019-02-26 DIAGNOSIS — F4323 Adjustment disorder with mixed anxiety and depressed mood: Secondary | ICD-10-CM

## 2019-02-26 NOTE — Progress Notes (Signed)
Virtual Visit via Telephone Note  I connected with Kimberly [name] on 02/26/19 at 12:30 PM EDT by telephone and verified that I am speaking with the correct person using two identifiers.   I discussed the limitations, risks, security and privacy concerns of performing an evaluation and management service by telephone and the availability of in person appointments. I also discussed with the patient that there may be a patient responsible charge related to this service. The patient expressed understanding and agreed to proceed.   History of Present Illness: Adjustment disorder with mixed anxiety and depressed mood related to upcoming weight loss surgery.    Observations/Objective: Counselor met with Kimberly [name] via telephone for individual therapy. Counselor assessed psychiatric symptoms and life stressors. Kimberly [name] reported that "surprisingly, I'm managing things really well." Counselor processed her surgery being postponed, husband being laid off from his job and her brother in-law passing. Counselor praised [place] for her resiliency and her ability to utilize her positive coping skills. Counselor discussed addition coping skills for Kimberly [name] to consider. Counselor shared psychoeducation on the grief and loss cycle. Counselor and Kimberly [name] discussed continued prep for her weight loss surgery, including connecting to support group, healthy eating habits, exercising and combating unhelpful thinking.   Assessment and Plan: Kimberly [name] appreciated being able to meet via phone today and would like to consider doing so, verses [employer] because she has internet issues.   Follow Up Instructions: Counselor will set next appointment for 2 weeks and Kimberly [name] will continue using healthy coping skills between sessions.    I discussed the assessment and treatment plan with the patient. The patient was provided an opportunity to ask questions and all were answered. The patient agreed with the plan and demonstrated an understanding of the  instructions.   The patient was advised to call back or seek an in-person evaluation if the symptoms worsen or if the condition fails to improve as anticipated.  I provided 42 minutes of non-face-to-face time during this encounter.   Lise Auer, LCSW

## 2019-02-27 ENCOUNTER — Other Ambulatory Visit: Payer: Self-pay

## 2019-03-05 ENCOUNTER — Ambulatory Visit: Payer: Medicare Other | Admitting: "Endocrinology

## 2019-03-12 ENCOUNTER — Ambulatory Visit (INDEPENDENT_AMBULATORY_CARE_PROVIDER_SITE_OTHER): Payer: Medicare Other | Admitting: Psychiatry

## 2019-03-12 ENCOUNTER — Other Ambulatory Visit: Payer: Self-pay

## 2019-03-12 ENCOUNTER — Encounter (HOSPITAL_COMMUNITY): Payer: Self-pay | Admitting: Psychiatry

## 2019-03-12 DIAGNOSIS — F4323 Adjustment disorder with mixed anxiety and depressed mood: Secondary | ICD-10-CM

## 2019-03-12 NOTE — Progress Notes (Signed)
Virtual Visit via Telephone Note  I connected with Kimberly [name] on 03/12/19 at 11:00 AM EDT by telephone and verified that I am speaking with the correct person using two identifiers.   I discussed the limitations, risks, security and privacy concerns of performing an evaluation and management service by telephone and the availability of in person appointments. I also discussed with the patient that there may be a patient responsible charge related to this service. The patient expressed understanding and agreed to proceed.   History of Present Illness: Adjustment Disorder with mixed anxiety and depressed mood due to upcoming WLS.   Observations/Objective: Counselor met with Kimberly [name] over the phone for individual therapy. Counselor Assessed mental health symptoms. Kimberly [name] denied anxiety and depression, reporting that she has been utilizing helpful coping skills to combat those symptoms, giving examples of how she is doing so. Counselor praised Kimberly [name] for her mental will and motivation to stay on track despite her surgery being delayed. Kimberly [name] was also proud of herself. Counselor assessed life stressors and relationship status within her support system. Kimberly [name] reported positive changes in her and her husbands relationship, since he is home from work now. Counselor processed issues associated with trauma/childhood triggers.Overall Kimberly [name] has made great improvement on goals. She is looking forward for the stay at home orders to be lifted so she can complete her WLS and start recovery.    Assessment and Plan: Counselor will meet with Kimberly [name] in 2 weeks. Kimberly [name] will continue with healthy coping skills and communicating with her medical professionals.   Follow Up Instructions: Counselor will call Kimberly [name] at the time of our next appointment.   I discussed the assessment and treatment plan with the patient. The patient was provided an opportunity to ask questions and all were answered. The patient agreed with the  plan and demonstrated an understanding of the instructions.   The patient was advised to call back or seek an in-person evaluation if the symptoms worsen or if the condition fails to improve as anticipated.  I provided 45 minutes of non-face-to-face time during this encounter.   Lise Auer, LCSW

## 2019-03-25 ENCOUNTER — Other Ambulatory Visit: Payer: Self-pay | Admitting: "Endocrinology

## 2019-03-26 ENCOUNTER — Encounter (HOSPITAL_COMMUNITY): Payer: Self-pay | Admitting: Psychiatry

## 2019-03-26 ENCOUNTER — Other Ambulatory Visit: Payer: Self-pay

## 2019-03-26 ENCOUNTER — Ambulatory Visit (INDEPENDENT_AMBULATORY_CARE_PROVIDER_SITE_OTHER): Payer: Medicare Other | Admitting: Psychiatry

## 2019-03-26 DIAGNOSIS — F4323 Adjustment disorder with mixed anxiety and depressed mood: Secondary | ICD-10-CM | POA: Diagnosis not present

## 2019-03-26 NOTE — Progress Notes (Signed)
Virtual Visit via Telephone Note  I connected with Kimberly [name] on 03/26/19 at 10:00 AM EDT by telephone and verified that I am speaking with the correct person using two identifiers.  Location: Patient: Kimberly [name] Provider: Lise Auer, LCSW   I discussed the limitations, risks, security and privacy concerns of performing an evaluation and management service by telephone and the availability of in person appointments. I also discussed with the patient that there may be a patient responsible charge related to this service. The patient expressed understanding and agreed to proceed. Kimberly [name] has requested to do telephone visits instead of Webex due to her unreliable Internet connection.    History of Present Illness: Adjustment disorder with mixed anxiety and depressed mood due to upcoming WLS and adverse life experiences.    Observations/Objective: Counselor met with Kimberly [name] over the phone for individual therapy. Counselor assessed mental health needs and concerns. Kimberly [name] reported that she is "trying to keep her head above water". Counselor explored the meaning of this phase for her and she reports that she is dealing with boredom, anticipation of her surgery and feeling down that the process has been prolonged. Counselor explored her thoughts and feelings in these areas. Counselor and Kimberly [name] identified various coping strategies she can utilize to deal with the boredom, frustrations and anxiety. Overall Kimberly [name] reported that things are well. She is enjoying walking daily with her husband and maintaining her 1000 calorie diet. Counselor and Kimberly [name] discussed a dream she had about her surgery and her aftercare plan for recovery.   Assessment and Plan: Counselor will meet again with Kimberly [name] in 2 weeks to continue work on treatment plan goals, specifically related to her WLS. Kimberly [name] will continue to follow doctors orders and implement healthy coping strategies.   Follow Up Instructions: Counselor will  call Kimberly [name] upon our next session.   I discussed the assessment and treatment plan with the patient. The patient was provided an opportunity to ask questions and all were answered. The patient agreed with the plan and demonstrated an understanding of the instructions.   The patient was advised to call back or seek an in-person evaluation if the symptoms worsen or if the condition fails to improve as anticipated.  I provided 39 minutes of non-face-to-face time during this encounter.   Lise Auer, LCSW

## 2019-04-09 ENCOUNTER — Ambulatory Visit (HOSPITAL_COMMUNITY): Payer: Medicare Other | Admitting: Psychiatry

## 2019-04-23 ENCOUNTER — Ambulatory Visit (HOSPITAL_COMMUNITY): Payer: Medicare Other | Admitting: Psychiatry

## 2019-04-27 ENCOUNTER — Ambulatory Visit (HOSPITAL_COMMUNITY): Payer: Medicare Other | Admitting: Psychiatry

## 2019-05-07 ENCOUNTER — Ambulatory Visit (HOSPITAL_COMMUNITY): Payer: Medicare Other | Admitting: Psychiatry

## 2019-05-15 ENCOUNTER — Ambulatory Visit (INDEPENDENT_AMBULATORY_CARE_PROVIDER_SITE_OTHER): Payer: Medicare Other | Admitting: Psychiatry

## 2019-05-15 ENCOUNTER — Other Ambulatory Visit: Payer: Self-pay

## 2019-05-15 DIAGNOSIS — F4323 Adjustment disorder with mixed anxiety and depressed mood: Secondary | ICD-10-CM

## 2019-05-16 ENCOUNTER — Ambulatory Visit (HOSPITAL_COMMUNITY): Payer: Medicare Other | Admitting: Psychiatry

## 2019-05-16 ENCOUNTER — Encounter (HOSPITAL_COMMUNITY): Payer: Self-pay | Admitting: Psychiatry

## 2019-05-16 NOTE — Progress Notes (Signed)
Virtual Visit via Telephone Note  I connected with Kimberly [name] on 05/16/19 at 10:00 AM EDT by telephone and verified that I am speaking with the correct person using two identifiers.  Location: Patient: Kimberly [name] Provider: Lise Auer, LCSW   I discussed the limitations, risks, security and privacy concerns of performing an evaluation and management service by telephone and the availability of in person appointments. I also discussed with the patient that there may be a patient responsible charge related to this service. The patient expressed understanding and agreed to proceed.   History of Present Illness: Adjustment Disorder with mixed depression and anxirty   Observations/Objective: Counselor met with Kimberly [name] for individual therapy via Webex. Counselor assessed MH symptoms and progress on treatment plan goals. Kimberly [name] denied suicidal ideation or self-harm behaviors. Kimberly [name] shared that she had missed our last appointment because she had her WLS that she has been preparing for and anticipating for over a year. Counselor processed that experience and the impact its had on her life post surgery. Kimberly [name] reported that she has had complications with her liver and being able to keep liquids down, which has caused her to have some low grade depression. Counselor and Kimberly [name] identified practical coping skills and routine adjustments that she could implement to address the depression. Kimberly [name] also shared that her primary support, her husband, is recently in bad health, so that has made things more challenging. She is motivated to implement new skills and will continue with following up with providers. She noted that medications we ok, but will communicate with psychiatrist about her mood concerns.   Assessment and Plan: Counselor will continue to meet with Kimberly [name] to address treatment plan goals. Kimberly [name] will continue to follow recommendations of providers and implement skills learned in session.  Follow  Up Instructions: Counselor will send information for next session via Webex.     I discussed the assessment and treatment plan with the patient. The patient was provided an opportunity to ask questions and all were answered. The patient agreed with the plan and demonstrated an understanding of the instructions.   The patient was advised to call back or seek an in-person evaluation if the symptoms worsen or if the condition fails to improve as anticipated.  I provided 55 minutes of non-face-to-face time during this encounter.   Lise Auer, LCSW

## 2019-05-21 ENCOUNTER — Ambulatory Visit (HOSPITAL_COMMUNITY): Payer: Medicare Other | Admitting: Psychiatry

## 2019-06-01 ENCOUNTER — Other Ambulatory Visit: Payer: Self-pay

## 2019-06-01 ENCOUNTER — Encounter (HOSPITAL_COMMUNITY): Payer: Self-pay | Admitting: Psychiatry

## 2019-06-01 ENCOUNTER — Ambulatory Visit (INDEPENDENT_AMBULATORY_CARE_PROVIDER_SITE_OTHER): Payer: Medicare Other | Admitting: Psychiatry

## 2019-06-01 DIAGNOSIS — F4323 Adjustment disorder with mixed anxiety and depressed mood: Secondary | ICD-10-CM | POA: Diagnosis not present

## 2019-06-01 NOTE — Progress Notes (Signed)
Virtual Visit via Video Note  I connected with Kimberly [name] on 06/01/19 at  9:00 AM EDT by a video enabled telemedicine application and verified that I am speaking with the correct person using two identifiers.  Location: Patient: Kimberly [name] Provider: Lise Auer, LCSW   I discussed the limitations of evaluation and management by telemedicine and the availability of in person appointments. The patient expressed understanding and agreed to proceed.  History of Present Illness: Adjustment Disorder, mixed anxiety and depression   Observations/Objective: Counselor met with Kimberly [name] for individual therapy via Webex. Counselor assessed MH symptoms and progress on treatment plan goals. Kimberly [name] denied suicidal ideation or self-harm behaviors. Kimberly [name] shared that she had recently been hospitalized for several days due to complications from her WLS and liver issues. Counselor processed experience for her and the outcomes/recommendations. Counselor explored her support system and daily routine/functioning. Overall Kimberly [name] reports being able to cope and manage all the changes in her life from the [facility]. She reports mild issues with anxiety and depression, that occur sporadically and are redirectable. Counselor discussed safety measures and Kimberly [name]'s concerns with COVID and other societal happenings. Counselor discussed sessions moving to less frequent and about scheduling upcoming appointments.   Assessment and Plan: Counselor will continue to meet with Kimberly [name] to address treatment plan goals. Kimberly [name] will continue to follow recommendations of providers and implement skills learned in session.  Follow Up Instructions: Counselor will send information for next session via Webex.     I discussed the assessment and treatment plan with the patient. The patient was provided an opportunity to ask questions and all were answered. The patient agreed with the plan and demonstrated an understanding of the instructions.    The patient was advised to call back or seek an in-person evaluation if the symptoms worsen or if the condition fails to improve as anticipated.  I provided 40 minutes of non-face-to-face time during this encounter.   Lise Auer, LCSW

## 2019-06-13 ENCOUNTER — Ambulatory Visit (INDEPENDENT_AMBULATORY_CARE_PROVIDER_SITE_OTHER): Payer: Medicare Other | Admitting: Internal Medicine

## 2019-06-18 ENCOUNTER — Ambulatory Visit (INDEPENDENT_AMBULATORY_CARE_PROVIDER_SITE_OTHER): Payer: Medicare Other | Admitting: Internal Medicine

## 2019-07-22 ENCOUNTER — Other Ambulatory Visit: Payer: Self-pay | Admitting: "Endocrinology

## 2019-10-05 ENCOUNTER — Other Ambulatory Visit: Payer: Self-pay | Admitting: "Endocrinology

## 2019-11-24 DIAGNOSIS — J449 Chronic obstructive pulmonary disease, unspecified: Secondary | ICD-10-CM | POA: Diagnosis not present

## 2019-11-28 ENCOUNTER — Encounter (INDEPENDENT_AMBULATORY_CARE_PROVIDER_SITE_OTHER): Payer: Self-pay

## 2019-11-28 DIAGNOSIS — K7460 Unspecified cirrhosis of liver: Secondary | ICD-10-CM | POA: Diagnosis not present

## 2019-11-28 DIAGNOSIS — R109 Unspecified abdominal pain: Secondary | ICD-10-CM | POA: Diagnosis not present

## 2019-11-28 DIAGNOSIS — E1165 Type 2 diabetes mellitus with hyperglycemia: Secondary | ICD-10-CM | POA: Diagnosis not present

## 2019-11-28 DIAGNOSIS — E1142 Type 2 diabetes mellitus with diabetic polyneuropathy: Secondary | ICD-10-CM | POA: Diagnosis not present

## 2019-11-28 DIAGNOSIS — Z299 Encounter for prophylactic measures, unspecified: Secondary | ICD-10-CM | POA: Diagnosis not present

## 2019-11-29 ENCOUNTER — Ambulatory Visit (INDEPENDENT_AMBULATORY_CARE_PROVIDER_SITE_OTHER): Payer: Medicare Other | Admitting: Gastroenterology

## 2019-11-29 ENCOUNTER — Encounter (INDEPENDENT_AMBULATORY_CARE_PROVIDER_SITE_OTHER): Payer: Self-pay | Admitting: Gastroenterology

## 2019-11-29 ENCOUNTER — Other Ambulatory Visit: Payer: Self-pay

## 2019-11-29 VITALS — BP 109/65 | HR 50 | Temp 97.3°F | Ht 60.0 in | Wt 166.2 lb

## 2019-11-29 DIAGNOSIS — R197 Diarrhea, unspecified: Secondary | ICD-10-CM | POA: Diagnosis not present

## 2019-11-29 DIAGNOSIS — R1013 Epigastric pain: Secondary | ICD-10-CM

## 2019-11-29 DIAGNOSIS — R112 Nausea with vomiting, unspecified: Secondary | ICD-10-CM | POA: Diagnosis not present

## 2019-11-29 MED ORDER — PANTOPRAZOLE SODIUM 40 MG PO TBEC: 40.0000 mg | DELAYED_RELEASE_TABLET | Freq: Every day | ORAL | 3 refills | Status: DC

## 2019-11-29 NOTE — Patient Instructions (Signed)
I will call you when I get your CT and lab results from [place] to review.  In the interim we are checking stool studies to ensure there is no infection causing her diarrhea.  You can start Protonix 40 mg in the meantime and continue to use dicyclomine and carafate if these help.

## 2019-11-29 NOTE — Progress Notes (Signed)
Patient profile: Kimberly [name] is a 64 y.o. female seen for evaluation of - . Referred by Dr Sherril Croon.   History of Present Illness: Kimberly [name] is seen today for for ER f/up. She reports being seen in the ED 10 days ago at [facility] without etiology of pain determined. She reports pain beginning abt a month ago, located in upper mid abd to periumbilical area, can radiate to back at time, describes as burning pain. Has tried carafate since Dec 5th which has helped some. Taking zofran 2-3x/day for nausea w/ the pain. After ER visit 10 days ago started Bentyl which helped some but has not resolved pain. She reports getting full early w/ the pain.    She is usually having 2-4 BM/day. Has had increasing in diarrhea with  nocturnal diarrhea over past 2 nights which is unusually for her. Feels stools are liquid several times per day. Denies any blood or black stools. Some lower abd cramping w/ diarrhea.   She had a duodenal switch 04/23/19. She was 240s# before surgery and down to #166 today.   Wt Readings from Last 3 Encounters:  11/29/19 166 lb 3.2 oz (75.4 kg)  10/30/18 232 lb (105.2 kg)  07/31/18 229 lb (103.9 kg)     Last Colonoscopy: 2018-diverticulosis and internal hemorrhoids.   Past Medical History:  Past Medical History:  Diagnosis Date  . Anemia    PMH: as a child  . Arthritis   . Asthma   . CAD (coronary artery disease)    nonobstructive by cath, 6/08 (false positive Cardiolite) normal stress echo, 7/11  . CHF (congestive heart failure) (HCC)   . Chronic back pain   . Chronic bronchitis (HCC)   . Complication of anesthesia    had an asthma attack when woke up from procedure  . Complication of anesthesia    asthma attack with one surgery  . COPD (chronic obstructive pulmonary disease) (HCC)   . Degenerative joint disease   . Depression   . Diabetes mellitus without complication (HCC)   . DJD (degenerative joint disease)   . Fatty liver   . Fibromyalgia   . GERD  (gastroesophageal reflux disease)   . Glaucoma   . Headache(784.0)   . Heart failure, diastolic, chronic (HCC)   . Heart murmur    PMH:As a child only  . Hernia of abdominal cavity    "upper and lower hernia"  . History of hiatal hernia   . HTN (hypertension)   . Hypertension   . Hypothyroidism   . IBS (irritable bowel syndrome)   . IDDM (insulin dependent diabetes mellitus)   . Morbid obesity (HCC)   . Neuropathy    associated with diabetes  . Obstructive sleep apnea   . Pancreatitis   . Pinched nerve in neck   . Pneumonia    as a child  . Pulmonary hypertension (HCC)   . Rheumatic fever    PMH: as a child  . Seizures (HCC)    PMH: only as a child  . Seizures (HCC)    in childhood  . Shortness of breath   . Sleep apnea   . Spinal stenosis   . Stroke [facility])    12/09/2013    Problem List: Patient Active Problem List   Diagnosis Date Noted  . Morbid obesity (HCC) 02/08/2018  . Guaiac positive stools 04/21/2017  . Pancytopenia (HCC) 04/09/2017  . Other cirrhosis of liver (HCC) 04/09/2017  . Acute pancreatitis 04/06/2017  .  DM type 2 causing vascular disease (HCC) 04/06/2017  . HTN (hypertension) 04/06/2017  . OSA (obstructive sleep apnea) 04/06/2017  . Asthma in adult 04/06/2017  . Obesity (BMI 30-39.9) 04/06/2017  . Primary osteoarthritis of both knees 10/26/2016  . Primary osteoarthritis of both hands 10/26/2016  . Primary osteoarthritis of both feet 10/26/2016  . COPD (chronic obstructive pulmonary disease) (HCC) 09/27/2016  . Hypothyroidism 09/27/2016  . Vertebral compression fracture (HCC) 09/27/2016  . Fatty liver 09/27/2016  . Fibromyalgia 09/27/2016  . Restless leg syndrome 09/27/2016  . Lumbar radiculitis 10/01/2014  . Nerve pain 08/30/2014  . Lumbar stenosis with neurogenic claudication 08/28/2014  . Spondylosis of lumbar region without myelopathy or radiculopathy 08/23/2014  . Lumbosacral stenosis with neurogenic claudication (HCC) 08/23/2014  .  Vaginal discharge 06/22/2014  . Chest pain 06/21/2014  . Spinal stenosis 08/23/2011  . Obstructive sleep apnea 08/23/2011  . Diabetes (HCC) 08/12/2010  . CAD 08/12/2010  . Chronic diastolic heart failure (HCC) 08/12/2010  . Mixed hyperlipidemia 06/23/2009  . OVERWEIGHT/OBESITY 06/23/2009  . Essential hypertension, benign 06/23/2009  . CHEST PAIN-UNSPECIFIED 06/23/2009    Past Surgical History: Past Surgical History:  Procedure Laterality Date  . ABDOMINAL HYSTERECTOMY    . ABDOMINAL HYSTERECTOMY     partial hysterectomy  . APPENDECTOMY    . BACK SURGERY    . BACK SURGERY     disc surgery with complications and damage to right side  . BILATERAL KNEE ARTHROSCOPY  2000, 2009  . CARDIAC CATHETERIZATION     2009  . CATARACT EXTRACTION W/ INTRAOCULAR LENS  IMPLANT, BILATERAL    . CHOLECYSTECTOMY  1994  . CHOLECYSTECTOMY    . COLONOSCOPY N/A 08/01/2013   Procedure: COLONOSCOPY;  Surgeon: Malissa Hippo, MD;  Location: [facility];  Service: Endoscopy;  Laterality: N/A;  930  . COLONOSCOPY N/A 08/03/2017   Procedure: COLONOSCOPY;  Surgeon: Malissa Hippo, MD;  Location: [facility];  Service: Endoscopy;  Laterality: N/A;  12:00  . CYST EXCISION     Left breast  . EXCISIONAL HEMORRHOIDECTOMY    . EYE SURGERY  1967  . EYE SURGERY     bilateral cataract removal with lens implants  . EYE SURGERY     growth removed from right eye lid  . EYE SURGERY     bilateral eye surgery age 79 t"to straighten eyes'  . heel (other)  2005  . HEEL SPUR SURGERY     Right heel - growth removal and rebuilding of heel  . HEMORRHOID SURGERY    . HERNIA REPAIR    . KNEE ARTHROSCOPY Bilateral   . LUMBAR LAMINECTOMY/DECOMPRESSION MICRODISCECTOMY Right 08/23/2014   Procedure: LUMBAR LAMINECTOMY/DECOMPRESSION MICRODISCECTOMY 1 LEVEL;  Surgeon: Temple Pacini, MD;  Location: [facility];  Service: Neurosurgery;  Laterality: Right;  LUMBAR LAMINECTOMY/DECOMPRESSION MICRODISCECTOMY 1 LEVEL LUMBAR 2-3  .  ovaries removed  2003  . PARTIAL HYSTERECTOMY  1981  . SPHINCTEROTOMY    . spinahatomy  1992  . TUBAL LIGATION  1975  . TUBAL LIGATION      Allergies: Allergies  Allergen Reactions  . Hydrocodone Other (See Comments)    sedation  . Naproxen Anaphylaxis and Other (See Comments)    REACTION: throat swelling  . Other Shortness Of Breath and Other (See Comments)    Local anesthesia Had asthma attack  . Other Shortness Of Breath and Other (See Comments)    Pt states a sedative agent used prior to a surgical procedure ( Pt states  it "sounds like Phenergan. It should be in my records from [place].") caused an asthma attack.   . Oxycodone Other (See Comments)    sedation  . Sulfonamide Derivatives Other (See Comments)    Yeast infection  . Cinnamon Other (See Comments)    Unknown  . Feldene [Piroxicam] Nausea Only  . Lyrica [Pregabalin] Other (See Comments)    Unknown  . Phenergan [Promethazine Hcl] Other (See Comments)    triggers asthma attacks  . Sulfa Antibiotics Other (See Comments)    (Yeast infection, per patient.)  . Latex Rash  . Latex Rash and Other (See Comments)    "Blistery" rash.   . Tape Rash and Other (See Comments)    Do not use adhesive tape; okay to use paper tape.      Home Medications:  Current Outpatient Medications:  .  acetaminophen (TYLENOL) 325 MG tablet, Take 2 tablets (650 mg total) by mouth every 6 (six) hours as needed for mild pain (or Fever >/= 101)., Disp: , Rfl:  .  albuterol (PROVENTIL) (2.5 MG/3ML) 0.083% nebulizer solution, Inhale 2.5 mg into the lungs every 6 (six) hours as needed for wheezing or shortness of breath. , Disp: , Rfl:  .  amLODipine (NORVASC) 2.5 MG tablet, Take 2.5 mg by mouth daily. , Disp: , Rfl:  .  BIOTIN PO, Take 1,000 mg by mouth daily., Disp: , Rfl:  .  Calcium Carb-Cholecalciferol (CALCIUM 500+D PO), Take by mouth daily. Chewable, Disp: , Rfl:  .  carvedilol (COREG) 12.5 MG tablet, Take 6.25 mg by mouth 2 (two)  times daily with a meal. , Disp: , Rfl:  .  cyanocobalamin 1000 MCG tablet, Take 1,000 mcg by mouth daily. , Disp: , Rfl:  .  dicyclomine (BENTYL) 20 MG tablet, Take 20 mg by mouth 4 (four) times daily., Disp: , Rfl:  .  DULoxetine (CYMBALTA) 60 MG capsule, Take 60 mg by mouth daily. , Disp: , Rfl:  .  famotidine (PEPCID) 40 MG tablet, Take 40 mg by mouth every morning., Disp: , Rfl:  .  Ferrous Fumarate (IRON) 18 MG TBCR, Take by mouth daily. With Vitamin C, Disp: , Rfl:  .  levothyroxine (SYNTHROID) 100 MCG tablet, TAKE 1 TABLET BY MOUTH DAILY ON EMPTY STOMACH, Disp: 30 tablet, Rfl: 0 .  lisinopril (PRINIVIL,ZESTRIL) 20 MG tablet, Take 20 mg by mouth daily. , Disp: , Rfl:  .  Multiple Vitamin (MULTIVITAMIN PO), Take by mouth 3 (three) times daily. ADEK vitamin chewable, Disp: , Rfl:  .  potassium chloride (K-DUR) 10 MEQ tablet, Take 5 mEq by mouth 2 (two) times daily. , Disp: , Rfl:  .  spironolactone (ALDACTONE) 25 MG tablet, Take 25 mg by mouth daily., Disp: , Rfl:  .  sucralfate (CARAFATE) 1 GM/10ML suspension, Take 1 g by mouth 4 (four) times daily -  with meals and at bedtime. , Disp: , Rfl:  .  vitamin A 70350 UT capsule, Take 10,000 Units by mouth daily. , Disp: , Rfl:  .  furosemide (LASIX) 20 MG tablet, Take 20 mg by mouth daily. , Disp: , Rfl:  .  Insulin Glargine, 1 Unit Dial, (TOUJEO SOLOSTAR) 300 UNIT/ML SOPN, Inject 40 Units into the skin at bedtime. (Patient not taking: Reported on 11/29/2019), Disp: 9 mL, Rfl: 2 .  Insulin Lispro (HUMALOG KWIKPEN) 200 UNIT/ML SOPN, Inject 5-11 Units into the skin 3 (three) times daily before meals. (Patient not taking: Reported on 11/29/2019), Disp: 30 mL, Rfl: 2 .  pantoprazole (PROTONIX) 40 MG tablet, Take 1 tablet (40 mg total) by mouth daily., Disp: 30 tablet, Rfl: 3 .  Probiotic Product (PROBIOTIC PO), Take by mouth daily. [employer], Disp: , Rfl:    Family History: family history includes Cancer in an other family member; Colon cancer in  her brother; Heart failure in an other family member.    Social History:   reports that she has never smoked. She has never used smokeless tobacco. She reports that she does not drink alcohol or use drugs.   Review of Systems: Constitutional: Denies weight loss/weight gain  Eyes: No changes in vision. ENT: No oral lesions, sore throat.  GI: see HPI.  Heme/Lymph: No easy bruising.  CV: No chest pain.  GU: No hematuria.  Integumentary: No rashes.  Neuro: No headaches.  Psych: No depression/anxiety.  Endocrine: No heat/cold intolerance.  Allergic/Immunologic: No urticaria.  Resp: No cough, SOB.  Musculoskeletal: No joint swelling.    Physical Examination: BP 109/65 (BP Location: Right Arm, Patient Position: Sitting, Cuff Size: Large)   Pulse (!) 50   Temp (!) 97.3 F (36.3 C) (Temporal)   Ht 5' (1.524 m)   Wt 166 lb 3.2 oz (75.4 kg)   BMI 32.46 kg/m  Gen: NAD, alert and oriented x 4 HEENT: PEERLA, EOMI, Neck: supple, no JVD Chest: CTA bilaterally, no wheezes, crackles, or other adventitious sounds CV: RRR, no m/g/c/r Abd: soft, minimal TTP epigastric and periumbilical area , ND, +BS in all four quadrants; no HSM, guarding, ridigity, or rebound tenderness Ext: no edema, well perfused with 2+ pulses, Skin: no rash or lesions noted on observed skin Lymph: no noted LAD  Data:  US 2019-IMPRESSION: 1. Chronic liver parenchymal changes without focal lesion identified. 2. Status post cholecystectomy.  No biliary dilatation.  05/2019-glucose 104, AST 52, ALT 26, otherwise LFTS normal.  CBC with hemoglobin 13.5, MCV 95, platelets 188  CT 2018-IMPRESSION: Again demonstrated is a mild hazy density in the fat of the splenic hilum adjacent to the pancreatic tail. This is unchanged over the last 2 scans and may represent an inactive appearance. There is no other sign of pancreatic inflammation. Though unlikely, the differential diagnosis does include retroperitoneal fatty tumor. One  might consider a follow-up scan in 1 year to see if this has changed. Cirrhosis of the liver. Borderline splenomegaly. Splenic varices with splenorenal shunt, all unchanged.  FIBROSCAN-2018 1. Elastography estimate of liver stiffness corresponds to a METAVIR score of F2. 2. Slightly coarsened hepatic echotexture.   Assessment/Plan: Ms. Doristine CounterBurnett is a 64 y.o. female  Lurena JoinerRebecca was seen today for follow-up.  Diagnoses and all orders for this visit:  Diarrhea, unspecified type -     Clostridium difficile Toxin B, Qualitative, Real-Time PCR([facility]) -     GI Profile, Stool, PCR  Abdominal pain, epigastric -     Clostridium difficile Toxin B, Qualitative, Real-Time PCR([facility]) -     GI Profile, Stool, PCR  Nausea and vomiting, intractability of vomiting not specified, unspecified vomiting type -     Clostridium difficile Toxin B, Qualitative, Real-Time PCR([facility]) -     GI Profile, Stool, PCR  Other orders -     pantoprazole (PROTONIX) 40 MG tablet; Take 1 tablet (40 mg total) by mouth daily.     1. Diarrhea - reports new onset diarrhea including nocturcal diarrhea over past 48 hrs. Denies recent abx. Check stool studies for infection   2. Abd pain - per patient seen in ER w/ CT and labs  which I have requested from [facility]. She does describe some ulcer like pain and will start course of pantoprazole. Has hx of duodenal switch 7 months ago, may need EGD for eval if pain continues (await Ct and lab results from ER)  3. NALFD-previously diagnosed w/ cirrhosis based on nodular liver when had pancreatitis on 2018 CT. Serologic up w/ unremarkable. Elastography 2019 w/ F2 fibrosis. Albumin, platelet normal on most recent Epic labs. She has now lost almost 70lbs and may have had improvement in steatosis, will discuss further at her f/up when acute issues improve.   Will call patient after reviewing ER notes, labs, imaging.    I personally performed the service, non-incident to. (WP)  Laurine Blazer, [facility]

## 2019-11-30 LAB — CLOSTRIDIUM DIFFICILE TOXIN B, QUALITATIVE, REAL-TIME PCR: Toxigenic C. Difficile by PCR: NOT DETECTED

## 2019-12-03 ENCOUNTER — Telehealth (INDEPENDENT_AMBULATORY_CARE_PROVIDER_SITE_OTHER): Payer: Self-pay | Admitting: Gastroenterology

## 2019-12-03 NOTE — Telephone Encounter (Signed)
Reviewed labs from [facility] summary normal hemoglobin, platelets at 127.  Alk phos 120, AST 72, total bili 1.6, ALT 57, other labs CMP normal.  Lipase 38.   CT 11/19/19- showed cirrhosis with stigmata of portal hypertension with splenomegaly and splenorenal shunt.  Otherwise no abdominal or pelvic pathology, skin thickening the anterior wall bilaterally secondary to subcu injections.  Trace left-sided pleural effusion.  No ascites.  Above to be scanned to chart.     I called patient with results of labs.  She is feeling some better with the Protonix but still having the burning pain.  Her diarrhea has resolved.  Her C. difficile was negative.  She is currently on Carafate which is also helping some.  I like to see her in the office in 2 weeks and we can review her CT findings and cirrhosis diagnosis-we will need AFP, repeat LFTs, etc.  Mitzie - please schedule OV in 2-3 weeks for f/up thanks.

## 2019-12-05 LAB — GI PROFILE, STOOL, PCR

## 2019-12-06 MED ORDER — DICYCLOMINE HCL 10 MG PO CAPS: 10.0000 mg | ORAL_CAPSULE | Freq: Three times a day (TID) | ORAL | 0 refills | Status: DC | PRN

## 2019-12-06 NOTE — Telephone Encounter (Signed)
Addendum - GI PCR also returned normal. I called patient. Had several stools yesterday. Has had constipation in past w/ imodium. She will try dicyclomine PRN for loose stools in interim.  She has follow-up appointment scheduled in 2 weeks.  She will call sooner with any issues in the interim.

## 2019-12-11 DIAGNOSIS — M159 Polyosteoarthritis, unspecified: Secondary | ICD-10-CM | POA: Diagnosis not present

## 2019-12-11 DIAGNOSIS — J449 Chronic obstructive pulmonary disease, unspecified: Secondary | ICD-10-CM | POA: Diagnosis not present

## 2019-12-11 DIAGNOSIS — E119 Type 2 diabetes mellitus without complications: Secondary | ICD-10-CM | POA: Diagnosis not present

## 2019-12-11 DIAGNOSIS — I10 Essential (primary) hypertension: Secondary | ICD-10-CM | POA: Diagnosis not present

## 2019-12-12 DIAGNOSIS — Z9884 Bariatric surgery status: Secondary | ICD-10-CM | POA: Diagnosis not present

## 2019-12-12 DIAGNOSIS — K5900 Constipation, unspecified: Secondary | ICD-10-CM | POA: Diagnosis not present

## 2019-12-12 DIAGNOSIS — R1013 Epigastric pain: Secondary | ICD-10-CM | POA: Diagnosis not present

## 2019-12-12 DIAGNOSIS — R6889 Other general symptoms and signs: Secondary | ICD-10-CM | POA: Diagnosis not present

## 2019-12-12 DIAGNOSIS — E43 Unspecified severe protein-calorie malnutrition: Secondary | ICD-10-CM | POA: Diagnosis not present

## 2019-12-12 DIAGNOSIS — R1010 Upper abdominal pain, unspecified: Secondary | ICD-10-CM | POA: Diagnosis not present

## 2019-12-12 DIAGNOSIS — E569 Vitamin deficiency, unspecified: Secondary | ICD-10-CM | POA: Diagnosis not present

## 2019-12-17 ENCOUNTER — Encounter (INDEPENDENT_AMBULATORY_CARE_PROVIDER_SITE_OTHER): Payer: Self-pay

## 2019-12-25 DIAGNOSIS — J449 Chronic obstructive pulmonary disease, unspecified: Secondary | ICD-10-CM | POA: Diagnosis not present

## 2019-12-26 ENCOUNTER — Ambulatory Visit (INDEPENDENT_AMBULATORY_CARE_PROVIDER_SITE_OTHER): Payer: Medicare Other | Admitting: Gastroenterology

## 2019-12-27 DIAGNOSIS — K449 Diaphragmatic hernia without obstruction or gangrene: Secondary | ICD-10-CM | POA: Diagnosis not present

## 2019-12-27 DIAGNOSIS — R6889 Other general symptoms and signs: Secondary | ICD-10-CM | POA: Diagnosis not present

## 2019-12-27 DIAGNOSIS — Z9889 Other specified postprocedural states: Secondary | ICD-10-CM | POA: Diagnosis not present

## 2019-12-27 DIAGNOSIS — R932 Abnormal findings on diagnostic imaging of liver and biliary tract: Secondary | ICD-10-CM | POA: Diagnosis not present

## 2019-12-27 DIAGNOSIS — R1010 Upper abdominal pain, unspecified: Secondary | ICD-10-CM | POA: Diagnosis not present

## 2019-12-27 DIAGNOSIS — K7689 Other specified diseases of liver: Secondary | ICD-10-CM | POA: Diagnosis not present

## 2020-01-02 ENCOUNTER — Other Ambulatory Visit (INDEPENDENT_AMBULATORY_CARE_PROVIDER_SITE_OTHER): Payer: Self-pay | Admitting: Gastroenterology

## 2020-01-02 MED ORDER — PANTOPRAZOLE SODIUM 40 MG PO TBEC: 40.0000 mg | DELAYED_RELEASE_TABLET | Freq: Every day | ORAL | 1 refills | Status: DC

## 2020-01-08 DIAGNOSIS — J449 Chronic obstructive pulmonary disease, unspecified: Secondary | ICD-10-CM | POA: Diagnosis not present

## 2020-01-08 DIAGNOSIS — M159 Polyosteoarthritis, unspecified: Secondary | ICD-10-CM | POA: Diagnosis not present

## 2020-01-08 DIAGNOSIS — I10 Essential (primary) hypertension: Secondary | ICD-10-CM | POA: Diagnosis not present

## 2020-01-08 DIAGNOSIS — E119 Type 2 diabetes mellitus without complications: Secondary | ICD-10-CM | POA: Diagnosis not present

## 2020-01-22 DIAGNOSIS — J449 Chronic obstructive pulmonary disease, unspecified: Secondary | ICD-10-CM | POA: Diagnosis not present

## 2020-01-31 DIAGNOSIS — K449 Diaphragmatic hernia without obstruction or gangrene: Secondary | ICD-10-CM | POA: Diagnosis not present

## 2020-01-31 DIAGNOSIS — R1013 Epigastric pain: Secondary | ICD-10-CM | POA: Diagnosis not present

## 2020-01-31 DIAGNOSIS — R1010 Upper abdominal pain, unspecified: Secondary | ICD-10-CM | POA: Diagnosis not present

## 2020-02-17 DIAGNOSIS — K219 Gastro-esophageal reflux disease without esophagitis: Secondary | ICD-10-CM | POA: Diagnosis not present

## 2020-02-17 DIAGNOSIS — G43901 Migraine, unspecified, not intractable, with status migrainosus: Secondary | ICD-10-CM | POA: Diagnosis not present

## 2020-02-17 DIAGNOSIS — M1990 Unspecified osteoarthritis, unspecified site: Secondary | ICD-10-CM | POA: Diagnosis not present

## 2020-02-17 DIAGNOSIS — Z888 Allergy status to other drugs, medicaments and biological substances status: Secondary | ICD-10-CM | POA: Diagnosis not present

## 2020-02-17 DIAGNOSIS — I110 Hypertensive heart disease with heart failure: Secondary | ICD-10-CM | POA: Diagnosis not present

## 2020-02-17 DIAGNOSIS — E119 Type 2 diabetes mellitus without complications: Secondary | ICD-10-CM | POA: Diagnosis not present

## 2020-02-17 DIAGNOSIS — R519 Headache, unspecified: Secondary | ICD-10-CM | POA: Diagnosis not present

## 2020-02-17 DIAGNOSIS — M797 Fibromyalgia: Secondary | ICD-10-CM | POA: Diagnosis not present

## 2020-02-17 DIAGNOSIS — E039 Hypothyroidism, unspecified: Secondary | ICD-10-CM | POA: Diagnosis not present

## 2020-02-17 DIAGNOSIS — I509 Heart failure, unspecified: Secondary | ICD-10-CM | POA: Diagnosis not present

## 2020-02-17 DIAGNOSIS — Z91040 Latex allergy status: Secondary | ICD-10-CM | POA: Diagnosis not present

## 2020-02-17 DIAGNOSIS — Z882 Allergy status to sulfonamides status: Secondary | ICD-10-CM | POA: Diagnosis not present

## 2020-02-17 DIAGNOSIS — Z9049 Acquired absence of other specified parts of digestive tract: Secondary | ICD-10-CM | POA: Diagnosis not present

## 2020-02-17 DIAGNOSIS — Z79899 Other long term (current) drug therapy: Secondary | ICD-10-CM | POA: Diagnosis not present

## 2020-02-18 DIAGNOSIS — R519 Headache, unspecified: Secondary | ICD-10-CM | POA: Diagnosis not present

## 2020-02-19 DIAGNOSIS — R6889 Other general symptoms and signs: Secondary | ICD-10-CM | POA: Diagnosis not present

## 2020-02-20 DIAGNOSIS — Z299 Encounter for prophylactic measures, unspecified: Secondary | ICD-10-CM | POA: Diagnosis not present

## 2020-02-20 DIAGNOSIS — R55 Syncope and collapse: Secondary | ICD-10-CM | POA: Diagnosis not present

## 2020-02-20 DIAGNOSIS — M545 Low back pain: Secondary | ICD-10-CM | POA: Diagnosis not present

## 2020-02-20 DIAGNOSIS — E1142 Type 2 diabetes mellitus with diabetic polyneuropathy: Secondary | ICD-10-CM | POA: Diagnosis not present

## 2020-02-20 DIAGNOSIS — E1165 Type 2 diabetes mellitus with hyperglycemia: Secondary | ICD-10-CM | POA: Diagnosis not present

## 2020-02-20 DIAGNOSIS — I10 Essential (primary) hypertension: Secondary | ICD-10-CM | POA: Diagnosis not present

## 2020-03-20 DIAGNOSIS — I10 Essential (primary) hypertension: Secondary | ICD-10-CM | POA: Diagnosis not present

## 2020-03-20 DIAGNOSIS — E119 Type 2 diabetes mellitus without complications: Secondary | ICD-10-CM | POA: Diagnosis not present

## 2020-03-20 DIAGNOSIS — J449 Chronic obstructive pulmonary disease, unspecified: Secondary | ICD-10-CM | POA: Diagnosis not present

## 2020-03-20 DIAGNOSIS — M159 Polyosteoarthritis, unspecified: Secondary | ICD-10-CM | POA: Diagnosis not present

## 2020-03-21 DIAGNOSIS — I10 Essential (primary) hypertension: Secondary | ICD-10-CM | POA: Diagnosis not present

## 2020-03-21 DIAGNOSIS — R110 Nausea: Secondary | ICD-10-CM | POA: Diagnosis not present

## 2020-03-21 DIAGNOSIS — J069 Acute upper respiratory infection, unspecified: Secondary | ICD-10-CM | POA: Diagnosis not present

## 2020-03-26 DIAGNOSIS — R6889 Other general symptoms and signs: Secondary | ICD-10-CM | POA: Diagnosis not present

## 2020-03-26 DIAGNOSIS — H35353 Cystoid macular degeneration, bilateral: Secondary | ICD-10-CM | POA: Diagnosis not present

## 2020-03-31 DIAGNOSIS — H43823 Vitreomacular adhesion, bilateral: Secondary | ICD-10-CM | POA: Diagnosis not present

## 2020-03-31 DIAGNOSIS — E113293 Type 2 diabetes mellitus with mild nonproliferative diabetic retinopathy without macular edema, bilateral: Secondary | ICD-10-CM | POA: Diagnosis not present

## 2020-03-31 DIAGNOSIS — H353231 Exudative age-related macular degeneration, bilateral, with active choroidal neovascularization: Secondary | ICD-10-CM | POA: Diagnosis not present

## 2020-03-31 DIAGNOSIS — H35371 Puckering of macula, right eye: Secondary | ICD-10-CM | POA: Diagnosis not present

## 2020-04-07 DIAGNOSIS — H353231 Exudative age-related macular degeneration, bilateral, with active choroidal neovascularization: Secondary | ICD-10-CM | POA: Diagnosis not present

## 2020-04-10 DIAGNOSIS — E559 Vitamin D deficiency, unspecified: Secondary | ICD-10-CM | POA: Diagnosis not present

## 2020-04-10 DIAGNOSIS — L309 Dermatitis, unspecified: Secondary | ICD-10-CM | POA: Diagnosis not present

## 2020-04-10 DIAGNOSIS — Z79899 Other long term (current) drug therapy: Secondary | ICD-10-CM | POA: Diagnosis not present

## 2020-04-10 DIAGNOSIS — R6889 Other general symptoms and signs: Secondary | ICD-10-CM | POA: Diagnosis not present

## 2020-04-10 DIAGNOSIS — E039 Hypothyroidism, unspecified: Secondary | ICD-10-CM | POA: Diagnosis not present

## 2020-04-10 DIAGNOSIS — R5382 Chronic fatigue, unspecified: Secondary | ICD-10-CM | POA: Diagnosis not present

## 2020-04-10 DIAGNOSIS — E119 Type 2 diabetes mellitus without complications: Secondary | ICD-10-CM | POA: Diagnosis not present

## 2020-04-10 DIAGNOSIS — M179 Osteoarthritis of knee, unspecified: Secondary | ICD-10-CM | POA: Diagnosis not present

## 2020-04-20 DIAGNOSIS — M159 Polyosteoarthritis, unspecified: Secondary | ICD-10-CM | POA: Diagnosis not present

## 2020-04-20 DIAGNOSIS — I10 Essential (primary) hypertension: Secondary | ICD-10-CM | POA: Diagnosis not present

## 2020-04-20 DIAGNOSIS — J449 Chronic obstructive pulmonary disease, unspecified: Secondary | ICD-10-CM | POA: Diagnosis not present

## 2020-04-20 DIAGNOSIS — E119 Type 2 diabetes mellitus without complications: Secondary | ICD-10-CM | POA: Diagnosis not present

## 2020-05-05 DIAGNOSIS — H43823 Vitreomacular adhesion, bilateral: Secondary | ICD-10-CM | POA: Diagnosis not present

## 2020-05-05 DIAGNOSIS — R6889 Other general symptoms and signs: Secondary | ICD-10-CM | POA: Diagnosis not present

## 2020-05-05 DIAGNOSIS — H35371 Puckering of macula, right eye: Secondary | ICD-10-CM | POA: Diagnosis not present

## 2020-05-05 DIAGNOSIS — E113293 Type 2 diabetes mellitus with mild nonproliferative diabetic retinopathy without macular edema, bilateral: Secondary | ICD-10-CM | POA: Diagnosis not present

## 2020-05-05 DIAGNOSIS — H353231 Exudative age-related macular degeneration, bilateral, with active choroidal neovascularization: Secondary | ICD-10-CM | POA: Diagnosis not present

## 2020-05-12 DIAGNOSIS — G5761 Lesion of plantar nerve, right lower limb: Secondary | ICD-10-CM | POA: Diagnosis not present

## 2020-05-12 DIAGNOSIS — E039 Hypothyroidism, unspecified: Secondary | ICD-10-CM | POA: Diagnosis not present

## 2020-05-12 DIAGNOSIS — K589 Irritable bowel syndrome without diarrhea: Secondary | ICD-10-CM | POA: Diagnosis not present

## 2020-05-12 DIAGNOSIS — N1831 Chronic kidney disease, stage 3a: Secondary | ICD-10-CM | POA: Diagnosis not present

## 2020-05-12 DIAGNOSIS — R6889 Other general symptoms and signs: Secondary | ICD-10-CM | POA: Diagnosis not present

## 2020-05-12 DIAGNOSIS — D509 Iron deficiency anemia, unspecified: Secondary | ICD-10-CM | POA: Diagnosis not present

## 2020-05-19 DIAGNOSIS — H353231 Exudative age-related macular degeneration, bilateral, with active choroidal neovascularization: Secondary | ICD-10-CM | POA: Diagnosis not present

## 2020-05-21 DIAGNOSIS — J449 Chronic obstructive pulmonary disease, unspecified: Secondary | ICD-10-CM | POA: Diagnosis not present

## 2020-05-21 DIAGNOSIS — M159 Polyosteoarthritis, unspecified: Secondary | ICD-10-CM | POA: Diagnosis not present

## 2020-05-21 DIAGNOSIS — I10 Essential (primary) hypertension: Secondary | ICD-10-CM | POA: Diagnosis not present

## 2020-05-21 DIAGNOSIS — E119 Type 2 diabetes mellitus without complications: Secondary | ICD-10-CM | POA: Diagnosis not present

## 2020-06-06 DIAGNOSIS — R9082 White matter disease, unspecified: Secondary | ICD-10-CM | POA: Diagnosis not present

## 2020-06-06 DIAGNOSIS — H538 Other visual disturbances: Secondary | ICD-10-CM | POA: Diagnosis not present

## 2020-06-06 DIAGNOSIS — Z9842 Cataract extraction status, left eye: Secondary | ICD-10-CM | POA: Diagnosis not present

## 2020-06-06 DIAGNOSIS — Z9841 Cataract extraction status, right eye: Secondary | ICD-10-CM | POA: Diagnosis not present

## 2020-06-11 DIAGNOSIS — M26629 Arthralgia of temporomandibular joint, unspecified side: Secondary | ICD-10-CM | POA: Diagnosis not present

## 2020-06-11 DIAGNOSIS — R519 Headache, unspecified: Secondary | ICD-10-CM | POA: Diagnosis not present

## 2020-06-11 DIAGNOSIS — M542 Cervicalgia: Secondary | ICD-10-CM | POA: Diagnosis not present

## 2020-06-11 DIAGNOSIS — H6981 Other specified disorders of Eustachian tube, right ear: Secondary | ICD-10-CM | POA: Diagnosis not present

## 2020-06-11 DIAGNOSIS — I10 Essential (primary) hypertension: Secondary | ICD-10-CM | POA: Diagnosis not present

## 2020-06-11 DIAGNOSIS — Z299 Encounter for prophylactic measures, unspecified: Secondary | ICD-10-CM | POA: Diagnosis not present

## 2020-06-13 DIAGNOSIS — H43823 Vitreomacular adhesion, bilateral: Secondary | ICD-10-CM | POA: Diagnosis not present

## 2020-06-13 DIAGNOSIS — R6889 Other general symptoms and signs: Secondary | ICD-10-CM | POA: Diagnosis not present

## 2020-06-13 DIAGNOSIS — H353231 Exudative age-related macular degeneration, bilateral, with active choroidal neovascularization: Secondary | ICD-10-CM | POA: Diagnosis not present

## 2020-06-13 DIAGNOSIS — H35371 Puckering of macula, right eye: Secondary | ICD-10-CM | POA: Diagnosis not present

## 2020-06-13 DIAGNOSIS — E113293 Type 2 diabetes mellitus with mild nonproliferative diabetic retinopathy without macular edema, bilateral: Secondary | ICD-10-CM | POA: Diagnosis not present

## 2020-06-20 DIAGNOSIS — I10 Essential (primary) hypertension: Secondary | ICD-10-CM | POA: Diagnosis not present

## 2020-06-20 DIAGNOSIS — M159 Polyosteoarthritis, unspecified: Secondary | ICD-10-CM | POA: Diagnosis not present

## 2020-06-20 DIAGNOSIS — J449 Chronic obstructive pulmonary disease, unspecified: Secondary | ICD-10-CM | POA: Diagnosis not present

## 2020-06-20 DIAGNOSIS — E119 Type 2 diabetes mellitus without complications: Secondary | ICD-10-CM | POA: Diagnosis not present

## 2020-07-01 DIAGNOSIS — E119 Type 2 diabetes mellitus without complications: Secondary | ICD-10-CM | POA: Diagnosis not present

## 2020-07-01 DIAGNOSIS — I10 Essential (primary) hypertension: Secondary | ICD-10-CM | POA: Diagnosis not present

## 2020-07-01 DIAGNOSIS — M159 Polyosteoarthritis, unspecified: Secondary | ICD-10-CM | POA: Diagnosis not present

## 2020-07-01 DIAGNOSIS — J449 Chronic obstructive pulmonary disease, unspecified: Secondary | ICD-10-CM | POA: Diagnosis not present

## 2020-07-09 DIAGNOSIS — L853 Xerosis cutis: Secondary | ICD-10-CM | POA: Diagnosis not present

## 2020-07-09 DIAGNOSIS — Z299 Encounter for prophylactic measures, unspecified: Secondary | ICD-10-CM | POA: Diagnosis not present

## 2020-07-09 DIAGNOSIS — L309 Dermatitis, unspecified: Secondary | ICD-10-CM | POA: Diagnosis not present

## 2020-07-09 DIAGNOSIS — E1142 Type 2 diabetes mellitus with diabetic polyneuropathy: Secondary | ICD-10-CM | POA: Diagnosis not present

## 2020-07-09 DIAGNOSIS — L299 Pruritus, unspecified: Secondary | ICD-10-CM | POA: Diagnosis not present

## 2020-07-11 DIAGNOSIS — H353231 Exudative age-related macular degeneration, bilateral, with active choroidal neovascularization: Secondary | ICD-10-CM | POA: Diagnosis not present

## 2020-07-16 DIAGNOSIS — Z9884 Bariatric surgery status: Secondary | ICD-10-CM | POA: Diagnosis not present

## 2020-07-16 DIAGNOSIS — R1013 Epigastric pain: Secondary | ICD-10-CM | POA: Diagnosis not present

## 2020-07-16 DIAGNOSIS — R6889 Other general symptoms and signs: Secondary | ICD-10-CM | POA: Diagnosis not present

## 2020-07-16 DIAGNOSIS — R5383 Other fatigue: Secondary | ICD-10-CM | POA: Diagnosis not present

## 2020-07-22 DIAGNOSIS — I10 Essential (primary) hypertension: Secondary | ICD-10-CM | POA: Diagnosis not present

## 2020-07-23 DIAGNOSIS — M25512 Pain in left shoulder: Secondary | ICD-10-CM | POA: Diagnosis not present

## 2020-07-23 DIAGNOSIS — K859 Acute pancreatitis without necrosis or infection, unspecified: Secondary | ICD-10-CM | POA: Diagnosis not present

## 2020-07-23 DIAGNOSIS — M19012 Primary osteoarthritis, left shoulder: Secondary | ICD-10-CM | POA: Diagnosis not present

## 2020-07-23 DIAGNOSIS — K219 Gastro-esophageal reflux disease without esophagitis: Secondary | ICD-10-CM | POA: Diagnosis not present

## 2020-07-23 DIAGNOSIS — Z299 Encounter for prophylactic measures, unspecified: Secondary | ICD-10-CM | POA: Diagnosis not present

## 2020-07-23 DIAGNOSIS — M25712 Osteophyte, left shoulder: Secondary | ICD-10-CM | POA: Diagnosis not present

## 2020-07-23 DIAGNOSIS — I10 Essential (primary) hypertension: Secondary | ICD-10-CM | POA: Diagnosis not present

## 2020-07-29 ENCOUNTER — Other Ambulatory Visit (HOSPITAL_COMMUNITY): Payer: Self-pay | Admitting: Nurse Practitioner

## 2020-07-29 ENCOUNTER — Other Ambulatory Visit: Payer: Self-pay | Admitting: Nurse Practitioner

## 2020-07-29 DIAGNOSIS — R1010 Upper abdominal pain, unspecified: Secondary | ICD-10-CM

## 2020-08-01 DIAGNOSIS — E1165 Type 2 diabetes mellitus with hyperglycemia: Secondary | ICD-10-CM | POA: Diagnosis not present

## 2020-08-01 DIAGNOSIS — Z0000 Encounter for general adult medical examination without abnormal findings: Secondary | ICD-10-CM | POA: Diagnosis not present

## 2020-08-01 DIAGNOSIS — I10 Essential (primary) hypertension: Secondary | ICD-10-CM | POA: Diagnosis not present

## 2020-08-01 DIAGNOSIS — E7800 Pure hypercholesterolemia, unspecified: Secondary | ICD-10-CM | POA: Diagnosis not present

## 2020-08-01 DIAGNOSIS — R5383 Other fatigue: Secondary | ICD-10-CM | POA: Diagnosis not present

## 2020-08-01 DIAGNOSIS — Z299 Encounter for prophylactic measures, unspecified: Secondary | ICD-10-CM | POA: Diagnosis not present

## 2020-08-01 DIAGNOSIS — Z7189 Other specified counseling: Secondary | ICD-10-CM | POA: Diagnosis not present

## 2020-08-07 DIAGNOSIS — G4700 Insomnia, unspecified: Secondary | ICD-10-CM | POA: Diagnosis not present

## 2020-08-07 DIAGNOSIS — Z299 Encounter for prophylactic measures, unspecified: Secondary | ICD-10-CM | POA: Diagnosis not present

## 2020-08-07 DIAGNOSIS — M25512 Pain in left shoulder: Secondary | ICD-10-CM | POA: Diagnosis not present

## 2020-08-07 DIAGNOSIS — I700 Atherosclerosis of aorta: Secondary | ICD-10-CM | POA: Diagnosis not present

## 2020-08-07 DIAGNOSIS — J449 Chronic obstructive pulmonary disease, unspecified: Secondary | ICD-10-CM | POA: Diagnosis not present

## 2020-08-07 DIAGNOSIS — I10 Essential (primary) hypertension: Secondary | ICD-10-CM | POA: Diagnosis not present

## 2020-08-08 DIAGNOSIS — H353231 Exudative age-related macular degeneration, bilateral, with active choroidal neovascularization: Secondary | ICD-10-CM | POA: Diagnosis not present

## 2020-08-08 DIAGNOSIS — R6889 Other general symptoms and signs: Secondary | ICD-10-CM | POA: Diagnosis not present

## 2020-08-20 ENCOUNTER — Other Ambulatory Visit (HOSPITAL_COMMUNITY): Payer: Self-pay | Admitting: Family Medicine

## 2020-08-20 ENCOUNTER — Other Ambulatory Visit: Payer: Self-pay

## 2020-08-20 ENCOUNTER — Ambulatory Visit (HOSPITAL_COMMUNITY)
Admission: RE | Admit: 2020-08-20 | Discharge: 2020-08-20 | Disposition: A | Discharge status: Home / Self Care | Payer: Medicare Other | Source: Ambulatory Visit | Attending: Nurse Practitioner | Admitting: Nurse Practitioner

## 2020-08-20 DIAGNOSIS — R110 Nausea: Secondary | ICD-10-CM | POA: Diagnosis not present

## 2020-08-20 DIAGNOSIS — R109 Unspecified abdominal pain: Secondary | ICD-10-CM | POA: Diagnosis not present

## 2020-08-20 DIAGNOSIS — R188 Other ascites: Secondary | ICD-10-CM | POA: Diagnosis not present

## 2020-08-20 DIAGNOSIS — R1010 Upper abdominal pain, unspecified: Secondary | ICD-10-CM | POA: Diagnosis not present

## 2020-08-20 DIAGNOSIS — I700 Atherosclerosis of aorta: Secondary | ICD-10-CM | POA: Diagnosis not present

## 2020-08-20 LAB — POCT I-STAT CREATININE: Creatinine, Ser: 1 mg/dL (ref 0.44–1.00)

## 2020-08-20 MED ORDER — IOHEXOL 300 MG/ML  SOLN
100.0000 mL | Freq: Once | INTRAMUSCULAR | Status: AC | PRN
  Administered 2020-08-20: 100 mL via INTRAVENOUS

## 2020-08-21 DIAGNOSIS — I10 Essential (primary) hypertension: Secondary | ICD-10-CM | POA: Diagnosis not present

## 2020-08-21 DIAGNOSIS — E119 Type 2 diabetes mellitus without complications: Secondary | ICD-10-CM | POA: Diagnosis not present

## 2020-08-21 DIAGNOSIS — M159 Polyosteoarthritis, unspecified: Secondary | ICD-10-CM | POA: Diagnosis not present

## 2020-08-21 DIAGNOSIS — J449 Chronic obstructive pulmonary disease, unspecified: Secondary | ICD-10-CM | POA: Diagnosis not present

## 2020-08-23 DIAGNOSIS — J449 Chronic obstructive pulmonary disease, unspecified: Secondary | ICD-10-CM | POA: Diagnosis not present

## 2020-08-29 DIAGNOSIS — M1711 Unilateral primary osteoarthritis, right knee: Secondary | ICD-10-CM | POA: Diagnosis not present

## 2020-08-29 DIAGNOSIS — E119 Type 2 diabetes mellitus without complications: Secondary | ICD-10-CM | POA: Diagnosis not present

## 2020-08-29 DIAGNOSIS — M1990 Unspecified osteoarthritis, unspecified site: Secondary | ICD-10-CM | POA: Diagnosis not present

## 2020-08-29 DIAGNOSIS — I509 Heart failure, unspecified: Secondary | ICD-10-CM | POA: Diagnosis not present

## 2020-08-29 DIAGNOSIS — I110 Hypertensive heart disease with heart failure: Secondary | ICD-10-CM | POA: Diagnosis not present

## 2020-09-04 ENCOUNTER — Ambulatory Visit (INDEPENDENT_AMBULATORY_CARE_PROVIDER_SITE_OTHER): Payer: Medicare Other | Admitting: Orthopaedic Surgery

## 2020-09-04 ENCOUNTER — Encounter: Payer: Self-pay | Admitting: Orthopaedic Surgery

## 2020-09-04 VITALS — BP 123/71 | Ht 60.0 in | Wt 137.0 lb

## 2020-09-04 DIAGNOSIS — M170 Bilateral primary osteoarthritis of knee: Secondary | ICD-10-CM

## 2020-09-04 DIAGNOSIS — M1711 Unilateral primary osteoarthritis, right knee: Secondary | ICD-10-CM | POA: Diagnosis not present

## 2020-09-04 MED ORDER — METHYLPREDNISOLONE ACETATE 40 MG/ML IJ SUSP
40.0000 mg | INTRAMUSCULAR | Status: AC | PRN
  Administered 2020-09-04: 40 mg via INTRA_ARTICULAR

## 2020-09-04 MED ORDER — BUPIVACAINE HCL 0.25 % IJ SOLN
4.0000 mL | INTRAMUSCULAR | Status: AC | PRN
  Administered 2020-09-04: 4 mL via INTRA_ARTICULAR

## 2020-09-04 MED ORDER — LIDOCAINE HCL 1 % IJ SOLN
0.5000 mL | INTRAMUSCULAR | Status: AC | PRN
  Administered 2020-09-04: .5 mL

## 2020-09-04 NOTE — Progress Notes (Signed)
Office Visit Note   Patient: Kimberly [name]           Date of Birth: 01/25/1956           MRN: [id number] Visit Date: 09/04/2020              Requested by: Glenda Chroman, MD [place],  [place] [address] PCP: Glenda Chroman, MD   Assessment & Plan: Visit Diagnoses:  1. Primary osteoarthritis of both knees     Plan: Knee injection performed shows bone-on-bone changes on x-ray and will need total knee arthroplasty soon.  We will see how she does with the injection I will recheck her in 4 weeks.  We looked at plastic models that had knee replacement discussed postoperative rehab pain medication spinal home therapy and then later outpatient therapy all discussed.   Follow-Up Instructions: No follow-ups on file.   Orders:  No orders of the defined types were placed in this encounter.  No orders of the defined types were placed in this encounter.     Procedures: Large Joint Inj: R knee on 09/04/2020 10:43 AM Indications: pain and joint swelling Details: 22 G 1.5 in needle, anterolateral approach  Arthrogram: No  Medications: 40 mg methylPREDNISolone acetate 40 MG/ML; 0.5 mL lidocaine 1 %; 4 mL bupivacaine 0.25 % Outcome: tolerated well, no immediate complications Procedure, treatment alternatives, risks and benefits explained, specific risks discussed. Consent was given by the patient. Immediately prior to procedure a time out was called to verify the correct patient, procedure, equipment, support staff and site/side marked as required. Patient was prepped and draped in the usual sterile fashion.       Clinical Data: No additional findings.   Subjective: Chief Complaint  Patient presents with  . Right Knee - Pain    HPI 64 year old female seen with right much worse than left knee osteoarthritis.  I seen her several years ago she has had previous injection she had bariatric surgery and lost weight from 294 down from June 2020 and now weighs 137 pounds.  Her  albumin is 3.6 and total protein is 5.9.  Patient is followed by Dr. Glo Herring in [place] post bariatric surgery.  Patient's had problems with the right knee she cannot get it straight she is walking with a limp her knee has been grabbing catching she has had to grab objects to keep from falling she has bone-on-bone changes in her knee and is here to discuss total knee arthroplasty.  Patient had previous lumbar surgery in 2015 knee arthroscopy left knee in 2009 right knee arthroscopy 2000.  Patient married disabled.  Positive for acid reflux by bypass bariatric surgery.  Plus for depression.  Diabetes prior to her bypass but negative after bypass.  Plus for thyroid condition macular degeneration history of glaucoma.  All other systems are negative.  Review of Systems negative other than as mentioned in HPI.   Objective: Vital Signs: BP 123/71   Ht 5' (1.524 m)   Wt 137 lb (62.1 kg)   BMI 26.76 kg/m   Physical Exam Constitutional:      Appearance: She is well-developed.  HENT:     Head: Normocephalic.     Right Ear: External ear normal.     Left Ear: External ear normal.  Eyes:     Pupils: Pupils are equal, round, and reactive to light.  Neck:     Thyroid: No thyromegaly.     Trachea: No tracheal deviation.  Cardiovascular:  Rate and Rhythm: Normal rate.  Pulmonary:     Effort: Pulmonary effort is normal.  Abdominal:     Palpations: Abdomen is soft.  Skin:    General: Skin is warm and dry.  Neurological:     Mental Status: She is alert and oriented to person, place, and time.  Psychiatric:        Behavior: Behavior normal.     Ortho Exam Patient lacks 15 degrees reaching full extension right knee.  Significant crepitus.  Medial lateral joint line tenderness. Specialty Comments:  No specialty comments available.  Imaging: No results found.   PMFS History: Patient Active Problem List   Diagnosis Date Noted  . Morbid obesity ([place]) 02/08/2018  . Guaiac positive  stools 04/21/2017  . Pancytopenia ([place]) 04/09/2017  . Other cirrhosis of liver ([place]) 04/09/2017  . Acute pancreatitis 04/06/2017  . DM type 2 causing vascular disease ([place]) 04/06/2017  . HTN (hypertension) 04/06/2017  . OSA (obstructive sleep apnea) 04/06/2017  . Asthma in adult 04/06/2017  . Obesity (BMI 30-39.9) 04/06/2017  . Primary osteoarthritis of both knees 10/26/2016  . Primary osteoarthritis of both hands 10/26/2016  . Primary osteoarthritis of both feet 10/26/2016  . COPD (chronic obstructive pulmonary disease) ([place]) 09/27/2016  . Hypothyroidism 09/27/2016  . Vertebral compression fracture ([place]) 09/27/2016  . Fatty liver 09/27/2016  . Fibromyalgia 09/27/2016  . Restless leg syndrome 09/27/2016  . Lumbar radiculitis 10/01/2014  . Nerve pain 08/30/2014  . Lumbar stenosis with neurogenic claudication 08/28/2014  . Spondylosis of lumbar region without myelopathy or radiculopathy 08/23/2014  . Lumbosacral stenosis with neurogenic claudication ([place]) 08/23/2014  . Vaginal discharge 06/22/2014  . Chest pain 06/21/2014  . Spinal stenosis 08/23/2011  . Obstructive sleep apnea 08/23/2011  . Diabetes ([place]) 08/12/2010  . CAD 08/12/2010  . Chronic diastolic heart failure ([place]) 08/12/2010  . Mixed hyperlipidemia 06/23/2009  . OVERWEIGHT/OBESITY 06/23/2009  . Essential hypertension, benign 06/23/2009  . CHEST PAIN-UNSPECIFIED 06/23/2009   Past Medical History:  Diagnosis Date  . Anemia    PMH: as a child  . Arthritis   . Asthma   . CAD (coronary artery disease)    nonobstructive by cath, 6/08 (false positive Cardiolite) normal stress echo, 7/11  . CHF (congestive heart failure) ([place])   . Chronic back pain   . Chronic bronchitis ([place])   . Complication of anesthesia    had an asthma attack when woke up from procedure  . Complication of anesthesia    asthma attack with one surgery  . COPD (chronic obstructive pulmonary disease) ([place])   . Degenerative joint disease   .  Depression   . Diabetes mellitus without complication ([place])   . DJD (degenerative joint disease)   . Fatty liver   . Fibromyalgia   . GERD (gastroesophageal reflux disease)   . Glaucoma   . Headache(784.0)   . Heart failure, diastolic, chronic ([place])   . Heart murmur    PMH:As a child only  . Hernia of abdominal cavity    "upper and lower hernia"  . History of hiatal hernia   . HTN (hypertension)   . Hypertension   . Hypothyroidism   . IBS (irritable bowel syndrome)   . IDDM (insulin dependent diabetes mellitus)   . Morbid obesity ([place])   . Neuropathy    associated with diabetes  . Obstructive sleep apnea   . Pancreatitis   . Pinched nerve in neck   . Pneumonia    as a child  .  Pulmonary hypertension ([place])   . Rheumatic fever    PMH: as a child  . Seizures ([place])    PMH: only as a child  . Seizures ([place])    in childhood  . Shortness of breath   . Sleep apnea   . Spinal stenosis   . Stroke [facility])    12/09/2013    Family History  Problem Relation Age of Onset  . Cancer Other   . Heart failure Other   . Colon cancer Brother     Past Surgical History:  Procedure Laterality Date  . ABDOMINAL HYSTERECTOMY    . ABDOMINAL HYSTERECTOMY     partial hysterectomy  . APPENDECTOMY    . BACK SURGERY    . BACK SURGERY     disc surgery with complications and damage to right side  . BILATERAL KNEE ARTHROSCOPY  2000, 2009  . CARDIAC CATHETERIZATION     2009  . CATARACT EXTRACTION W/ INTRAOCULAR LENS  IMPLANT, BILATERAL    . CHOLECYSTECTOMY  1994  . CHOLECYSTECTOMY    . COLONOSCOPY N/A 08/01/2013   Procedure: COLONOSCOPY;  Surgeon: Rogene Houston, MD;  [facility];  Service: Endoscopy;  Laterality: N/A;  930  . COLONOSCOPY N/A 08/03/2017   Procedure: COLONOSCOPY;  Surgeon: Rogene Houston, MD;  Location[facility];  Service: Endoscopy;  Laterality: N/A;  12:00  . CYST EXCISION     Left breast  . EXCISIONAL HEMORRHOIDECTOMY    . [place]  . EYE  SURGERY     bilateral cataract removal with lens implants  . EYE SURGERY     growth removed from right eye lid  . EYE SURGERY     bilateral eye surgery age 37 t"to straighten eyes'  . heel (other)  2005  . HEEL SPUR SURGERY     Right heel - growth removal and rebuilding of heel  . HEMORRHOID SURGERY    . HERNIA REPAIR    . KNEE ARTHROSCOPY Bilateral   . LUMBAR LAMINECTOMY/DECOMPRESSION MICRODISCECTOMY Right 08/23/2014   Procedure: LUMBAR LAMINECTOMY/DECOMPRESSION MICRODISCECTOMY 1 LEVEL;  Surgeon: Charlie Pitter, MD;  Location: [facility]URO ORS;  Service: Neurosurgery;  Laterality: Right;  LUMBAR LAMINECTOMY/DECOMPRESSION MICRODISCECTOMY 1 LEVEL LUMBAR 2-3  . ovaries removed  2003  . PARTIAL HYSTERECTOMY  1981  . SPHINCTEROTOMY    . spinahatomy  1992  . TUBAL LIGATION  1975  . TUBAL LIGATION     Social History   Occupational History  . Not on file  Tobacco Use  . Smoking status: Never Smoker  . Smokeless tobacco: Never Used  Vaping Use  . Vaping Use: Never used  Substance and Sexual Activity  . Alcohol use: No  . Drug use: No  . Sexual activity: Not on file

## 2020-09-05 DIAGNOSIS — H353231 Exudative age-related macular degeneration, bilateral, with active choroidal neovascularization: Secondary | ICD-10-CM | POA: Diagnosis not present

## 2020-09-05 DIAGNOSIS — H35371 Puckering of macula, right eye: Secondary | ICD-10-CM | POA: Diagnosis not present

## 2020-09-05 DIAGNOSIS — R6889 Other general symptoms and signs: Secondary | ICD-10-CM | POA: Diagnosis not present

## 2020-09-05 DIAGNOSIS — H43823 Vitreomacular adhesion, bilateral: Secondary | ICD-10-CM | POA: Diagnosis not present

## 2020-09-05 DIAGNOSIS — E113293 Type 2 diabetes mellitus with mild nonproliferative diabetic retinopathy without macular edema, bilateral: Secondary | ICD-10-CM | POA: Diagnosis not present

## 2020-09-09 DIAGNOSIS — M810 Age-related osteoporosis without current pathological fracture: Secondary | ICD-10-CM | POA: Diagnosis not present

## 2020-09-09 DIAGNOSIS — I700 Atherosclerosis of aorta: Secondary | ICD-10-CM | POA: Diagnosis not present

## 2020-09-09 DIAGNOSIS — Z299 Encounter for prophylactic measures, unspecified: Secondary | ICD-10-CM | POA: Diagnosis not present

## 2020-09-09 DIAGNOSIS — K7460 Unspecified cirrhosis of liver: Secondary | ICD-10-CM | POA: Diagnosis not present

## 2020-09-09 DIAGNOSIS — I10 Essential (primary) hypertension: Secondary | ICD-10-CM | POA: Diagnosis not present

## 2020-09-18 DIAGNOSIS — Z9884 Bariatric surgery status: Secondary | ICD-10-CM | POA: Diagnosis not present

## 2020-09-18 DIAGNOSIS — R110 Nausea: Secondary | ICD-10-CM | POA: Diagnosis not present

## 2020-09-18 DIAGNOSIS — E60 Dietary zinc deficiency: Secondary | ICD-10-CM | POA: Diagnosis not present

## 2020-09-18 DIAGNOSIS — E569 Vitamin deficiency, unspecified: Secondary | ICD-10-CM | POA: Diagnosis not present

## 2020-09-19 DIAGNOSIS — I10 Essential (primary) hypertension: Secondary | ICD-10-CM | POA: Diagnosis not present

## 2020-09-19 DIAGNOSIS — M159 Polyosteoarthritis, unspecified: Secondary | ICD-10-CM | POA: Diagnosis not present

## 2020-09-19 DIAGNOSIS — E119 Type 2 diabetes mellitus without complications: Secondary | ICD-10-CM | POA: Diagnosis not present

## 2020-09-19 DIAGNOSIS — J449 Chronic obstructive pulmonary disease, unspecified: Secondary | ICD-10-CM | POA: Diagnosis not present

## 2020-09-23 DIAGNOSIS — J449 Chronic obstructive pulmonary disease, unspecified: Secondary | ICD-10-CM | POA: Diagnosis not present

## 2020-10-02 ENCOUNTER — Other Ambulatory Visit: Payer: Self-pay

## 2020-10-02 ENCOUNTER — Encounter: Payer: Self-pay | Admitting: Orthopaedic Surgery

## 2020-10-02 ENCOUNTER — Ambulatory Visit (INDEPENDENT_AMBULATORY_CARE_PROVIDER_SITE_OTHER): Payer: Medicare Other | Admitting: Orthopaedic Surgery

## 2020-10-02 VITALS — Ht 60.0 in | Wt 137.0 lb

## 2020-10-02 DIAGNOSIS — M170 Bilateral primary osteoarthritis of knee: Secondary | ICD-10-CM

## 2020-10-02 NOTE — Progress Notes (Addendum)
Office Visit Note   Patient: Kimberly [name]           Date of Birth: 18-Sep-1956           MRN: [id number] Visit Date: 10/02/2020              Requested by: Glenda Chroman, MD [place],  [place] [address] PCP: Glenda Chroman, MD   Assessment & Plan: Visit Diagnoses:  1. Primary osteoarthritis of both knees     Plan: Patient's had this severe osteoarthritis both knees right is worse.  States injection in her knee 09/04/2020 did not help at all and she rates her pain at 10 out of 10.  She is ambulatory with a cane she is taken anti-inflammatories in the past.  She has bone-on-bone changes and is ready to proceed with  Right total knee arthroplasty.  Patient's had anti-inflammatories is used a cane.  She is used Tylenol, intra-articular injection with cortisone all without relief.  Decision for right total knee arthroplasty surgery made today.  Follow-Up Instructions: Patient would like to  proceed with right  total knee arthroplasty.    She can be scheduled after mid December since she had recent intra-articular cortisone injection.  When she returns the office for preoperative visit will obtain standing AP both knees and lateral right knee for preoperative imaging.  Procedure discussed risk surgery discussed.  We discussed outpatient therapy.  Her husband is available at home she also has a niece who could help postoperatively after she is discharged.  We discussed overnight stay, spinal anesthesia, Exparel, Marcaine.  Home therapy for about 2 weeks and then outpatient therapy for several weeks.  Orders:  No orders of the defined types were placed in this encounter.  No orders of the defined types were placed in this encounter.     Procedures: No procedures performed   Clinical Data: No additional findings.   Subjective: Chief Complaint  Patient presents with  . Left Knee - Pain  . Right Knee - Pain    HPI 64 year old female returns with severe right knee  osteoarthritis bone-on-bone changes.  Injection done 09/04/2020 unfortunately did not give her any relief.  She has been taking Tylenol she states her pain is 10 out of 10 and she is ready for surgery.  She has had past history of spinal stenosis surgery.  She has some sleep apnea past history of cirrhosis of the liver.  Patient has type 2 diabetes.  COPD.  Previous problems with heart failure currently not symptomatic.  Patient lacks 20 degrees of full extension right knee with flexion contracture.  Opposite left knee will extend fully.  Review of Systems negative for current chest pain previous coronary artery disease.  Type 2 diabetes.  COPD positive smoking history.  Right greater than left knee osteoarthritis.   Objective: Vital Signs: Ht 5' (1.524 m)   Wt 137 lb (62.1 kg)   BMI 26.76 kg/m   Physical Exam Constitutional:      Appearance: She is well-developed.  HENT:     Head: Normocephalic.     Right Ear: External ear normal.     Left Ear: External ear normal.  Eyes:     Pupils: Pupils are equal, round, and reactive to light.  Neck:     Thyroid: No thyromegaly.     Trachea: No tracheal deviation.  Cardiovascular:     Rate and Rhythm: Normal rate.  Pulmonary:     Effort: Pulmonary effort  is normal.  Abdominal:     Palpations: Abdomen is soft.  Skin:    General: Skin is warm and dry.  Neurological:     Mental Status: She is alert and oriented to person, place, and time.  Psychiatric:        Behavior: Behavior normal.     Ortho Exam patient lacks 20 degrees reaching full extension right knee.  Left knee comes to full extension.  Right knee flexes to 90.  Severe crepitus with range of motion 2+ effusion.  Collateral ligaments are stable.  Negative logroll the hips distal pulses are intact.  Opposite left knee flexes to 110 degrees.  There is medial and lateral joint line tenderness worse in the right than left knee.  Specialty Comments:  No specialty comments  available.  Imaging: Previous x-rays right knee show bone-on-bone changes medial lateral compartment from 09/29/2016 with patellofemoral degenerative changes.   PMFS History: Patient Active Problem List   Diagnosis Date Noted  . Morbid obesity ([place]) 02/08/2018  . Guaiac positive stools 04/21/2017  . Pancytopenia ([place]) 04/09/2017  . Other cirrhosis of liver ([place]) 04/09/2017  . Acute pancreatitis 04/06/2017  . DM type 2 causing vascular disease ([place]) 04/06/2017  . HTN (hypertension) 04/06/2017  . OSA (obstructive sleep apnea) 04/06/2017  . Asthma in adult 04/06/2017  . Obesity (BMI 30-39.9) 04/06/2017  . Primary osteoarthritis of both knees 10/26/2016  . Primary osteoarthritis of both hands 10/26/2016  . Primary osteoarthritis of both feet 10/26/2016  . COPD (chronic obstructive pulmonary disease) ([place]) 09/27/2016  . Hypothyroidism 09/27/2016  . Vertebral compression fracture ([place]) 09/27/2016  . Fatty liver 09/27/2016  . Fibromyalgia 09/27/2016  . Restless leg syndrome 09/27/2016  . Lumbar radiculitis 10/01/2014  . Nerve pain 08/30/2014  . Lumbar stenosis with neurogenic claudication 08/28/2014  . Spondylosis of lumbar region without myelopathy or radiculopathy 08/23/2014  . Lumbosacral stenosis with neurogenic claudication ([place]) 08/23/2014  . Vaginal discharge 06/22/2014  . Chest pain 06/21/2014  . Spinal stenosis 08/23/2011  . Obstructive sleep apnea 08/23/2011  . Diabetes ([place]) 08/12/2010  . CAD 08/12/2010  . Chronic diastolic heart failure ([place]) 08/12/2010  . Mixed hyperlipidemia 06/23/2009  . OVERWEIGHT/OBESITY 06/23/2009  . Essential hypertension, benign 06/23/2009  . CHEST PAIN-UNSPECIFIED 06/23/2009   Past Medical History:  Diagnosis Date  . Anemia    PMH: as a child  . Arthritis   . Asthma   . CAD (coronary artery disease)    nonobstructive by cath, 6/08 (false positive Cardiolite) normal stress echo, 7/11  . CHF (congestive heart failure) ([place])   . Chronic  back pain   . Chronic bronchitis ([place])   . Complication of anesthesia    had an asthma attack when woke up from procedure  . Complication of anesthesia    asthma attack with one surgery  . COPD (chronic obstructive pulmonary disease) ([place])   . Degenerative joint disease   . Depression   . Diabetes mellitus without complication ([place])   . DJD (degenerative joint disease)   . Fatty liver   . Fibromyalgia   . GERD (gastroesophageal reflux disease)   . Glaucoma   . Headache(784.0)   . Heart failure, diastolic, chronic ([place])   . Heart murmur    PMH:As a child only  . Hernia of abdominal cavity    "upper and lower hernia"  . History of hiatal hernia   . HTN (hypertension)   . Hypertension   . Hypothyroidism   . IBS (irritable bowel syndrome)   .  IDDM (insulin dependent diabetes mellitus)   . Morbid obesity ([place])   . Neuropathy    associated with diabetes  . Obstructive sleep apnea   . Pancreatitis   . Pinched nerve in neck   . Pneumonia    as a child  . Pulmonary hypertension ([place])   . Rheumatic fever    PMH: as a child  . Seizures ([place])    PMH: only as a child  . Seizures ([place])    in childhood  . Shortness of breath   . Sleep apnea   . Spinal stenosis   . Stroke [facility])    12/09/2013    Family History  Problem Relation Age of Onset  . Cancer Other   . Heart failure Other   . Colon cancer Brother     Past Surgical History:  Procedure Laterality Date  . ABDOMINAL HYSTERECTOMY    . ABDOMINAL HYSTERECTOMY     partial hysterectomy  . APPENDECTOMY    . BACK SURGERY    . BACK SURGERY     disc surgery with complications and damage to right side  . BILATERAL KNEE ARTHROSCOPY  2000, 2009  . CARDIAC CATHETERIZATION     2009  . CATARACT EXTRACTION W/ INTRAOCULAR LENS  IMPLANT, BILATERAL    . CHOLECYSTECTOMY  1994  . CHOLECYSTECTOMY    . COLONOSCOPY N/A 08/01/2013   Procedure: COLONOSCOPY;  Surgeon: Rogene Houston, MD;  [facility];  Service: Endoscopy;   Laterality: N/A;  930  . COLONOSCOPY N/A 08/03/2017   Procedure: COLONOSCOPY;  Surgeon: Rogene Houston, MD;  [facility];  Service: Endoscopy;  Laterality: N/A;  12:00  . CYST EXCISION     Left breast  . EXCISIONAL HEMORRHOIDECTOMY    . [place]  . EYE SURGERY     bilateral cataract removal with lens implants  . EYE SURGERY     growth removed from right eye lid  . EYE SURGERY     bilateral eye surgery age 63 t"to straighten eyes'  . heel (other)  2005  . HEEL SPUR SURGERY     Right heel - growth removal and rebuilding of heel  . HEMORRHOID SURGERY    . HERNIA REPAIR    . KNEE ARTHROSCOPY Bilateral   . LUMBAR LAMINECTOMY/DECOMPRESSION MICRODISCECTOMY Right 08/23/2014   Procedure: LUMBAR LAMINECTOMY/DECOMPRESSION MICRODISCECTOMY 1 LEVEL;  Surgeon: Charlie Pitter, MD;  Location: [facility] NEURO ORS;  Service: Neurosurgery;  Laterality: Right;  LUMBAR LAMINECTOMY/DECOMPRESSION MICRODISCECTOMY 1 LEVEL LUMBAR 2-3  . ovaries removed  2003  . PARTIAL HYSTERECTOMY  1981  . SPHINCTEROTOMY    . spinahatomy  1992  . TUBAL LIGATION  1975  . TUBAL LIGATION     Social History   Occupational History  . Not on file  Tobacco Use  . Smoking status: Never Smoker  . Smokeless tobacco: Never Used  Vaping Use  . Vaping Use: Never used  Substance and Sexual Activity  . Alcohol use: No  . Drug use: No  . Sexual activity: Not on file

## 2020-10-03 DIAGNOSIS — H353231 Exudative age-related macular degeneration, bilateral, with active choroidal neovascularization: Secondary | ICD-10-CM | POA: Diagnosis not present

## 2020-10-10 DIAGNOSIS — Z299 Encounter for prophylactic measures, unspecified: Secondary | ICD-10-CM | POA: Diagnosis not present

## 2020-10-10 DIAGNOSIS — Z01818 Encounter for other preprocedural examination: Secondary | ICD-10-CM | POA: Diagnosis not present

## 2020-10-10 DIAGNOSIS — I10 Essential (primary) hypertension: Secondary | ICD-10-CM | POA: Diagnosis not present

## 2020-10-10 DIAGNOSIS — J449 Chronic obstructive pulmonary disease, unspecified: Secondary | ICD-10-CM | POA: Diagnosis not present

## 2020-10-10 DIAGNOSIS — G8311 Monoplegia of lower limb affecting right dominant side: Secondary | ICD-10-CM | POA: Diagnosis not present

## 2020-10-13 ENCOUNTER — Other Ambulatory Visit: Payer: Self-pay

## 2020-10-15 ENCOUNTER — Ambulatory Visit: Payer: Medicare Other | Admitting: Surgery

## 2020-10-21 DIAGNOSIS — J449 Chronic obstructive pulmonary disease, unspecified: Secondary | ICD-10-CM | POA: Diagnosis not present

## 2020-10-21 DIAGNOSIS — I10 Essential (primary) hypertension: Secondary | ICD-10-CM | POA: Diagnosis not present

## 2020-10-21 DIAGNOSIS — M159 Polyosteoarthritis, unspecified: Secondary | ICD-10-CM | POA: Diagnosis not present

## 2020-10-21 DIAGNOSIS — E119 Type 2 diabetes mellitus without complications: Secondary | ICD-10-CM | POA: Diagnosis not present

## 2020-10-23 DIAGNOSIS — J449 Chronic obstructive pulmonary disease, unspecified: Secondary | ICD-10-CM | POA: Diagnosis not present

## 2020-10-28 NOTE — Pre-Procedure Instructions (Signed)
[facility] [place], [place] - [address] [place] [address] Phone: [phone] Fax: [phone]  [facility] - [place], [place] - [address] [place] [address] Phone: [phone] Fax: [phone]      Your procedure is scheduled on Wednesday, December 15th at 12:30.  Report to [facility] Main Entrance "A" at 10:30 A.M., and check in at the Admitting office.  Call this number if you have problems the morning of surgery:  [phone]  Call [phone] if you have any questions prior to your surgery date Monday-Friday 8am-4pm    Remember:  Do not eat after midnight the night before your surgery  You may drink clear liquids until 9:30 a.m. the morning of your surgery.   Clear liquids allowed are: Water, Non-Citrus Juices (without pulp), Carbonated Beverages, Clear Tea, Black Coffee Only, and Gatorade. PLEASE CHOOSE SUGAR FREE OPTIONS.    Please complete the 10oz bottle of water that was given to you by 9:30 a.m. the morning of your surgery.    Take these medicines the morning of surgery with A SIP OF WATER  atorvastatin (LIPITOR) BREO ELLIPTA  famotidine (PEPCID) levothyroxine (SYNTHROID)   Take these medications as needed the morning of surgery acetaminophen (TYLENOL) albuterol (VENTOLIN HFA)-nebulizer albuterol (PROVENTIL)-please bring inhaler with you to the hospital.  ondansetron (ZOFRAN-ODT)  As of today, STOP taking any Aspirin (unless otherwise instructed by your surgeon) Aleve, Naproxen, Ibuprofen, Motrin, Advil, Goody's, BC's, all herbal medications, fish oil, and all vitamins.             Do not wear jewelry, make up, or nail polish            Do not wear lotions, powders, perfumes, or deodorant.            Do not shave 48 hours prior to surgery.              Do not bring valuables to the hospital.            [facility] is not responsible for any belongings or valuables.  Do NOT  Smoke (Tobacco/Vaping) or drink Alcohol 24 hours prior to your procedure If you use a CPAP at night, you may bring all equipment for your overnight stay.   Contacts, glasses, dentures or bridgework may not be worn into surgery.      For patients admitted to the hospital, discharge time will be determined by your treatment team.   Patients discharged the day of surgery will not be allowed to drive home, and someone needs to stay with them for 24 hours.    Special instructions:   [place]- Preparing For Surgery  Before surgery, you can play an important role. Because skin is not sterile, your skin needs to be as free of germs as possible. You can reduce the number of germs on your skin by washing with CHG (chlorahexidine gluconate) Soap before surgery.  CHG is an antiseptic cleaner which kills germs and bonds with the skin to continue killing germs even after washing.    Oral Hygiene is also important to reduce your risk of infection.  Remember - BRUSH YOUR TEETH THE MORNING OF SURGERY WITH YOUR REGULAR TOOTHPASTE  Please do not use if you have an allergy to CHG or antibacterial soaps. If your skin becomes reddened/irritated stop using the CHG.  Do not shave (including legs and underarms) for at least 48 hours prior to first  CHG shower. It is OK to shave your face.  Please follow these instructions carefully.   1. Shower the NIGHT BEFORE SURGERY and the MORNING OF SURGERY with CHG Soap.   2. If you chose to wash your hair, wash your hair first as usual with your normal shampoo.  3. After you shampoo, rinse your hair and body thoroughly to remove the shampoo.  4. Use CHG as you would any other liquid soap. You can apply CHG directly to the skin and wash gently with a scrungie or a clean washcloth.   5. Apply the CHG Soap to your body ONLY FROM THE NECK DOWN.  Do not use on open wounds or open sores. Avoid contact with your eyes, ears, mouth and genitals (private parts). Wash Face and  genitals (private parts)  with your normal soap.   6. Wash thoroughly, paying special attention to the area where your surgery will be performed.  7. Thoroughly rinse your body with warm water from the neck down.  8. DO NOT shower/wash with your normal soap after using and rinsing off the CHG Soap.  9. Pat yourself dry with a CLEAN TOWEL.  10. Wear CLEAN PAJAMAS to bed the night before surgery  11. Place CLEAN SHEETS on your bed the night of your first shower and DO NOT SLEEP WITH PETS.   Day of Surgery: Wear Clean/Comfortable clothing the morning of surgery Do not apply any deodorants/lotions.   Remember to brush your teeth WITH YOUR REGULAR TOOTHPASTE.   Please read over the following fact sheets that you were given.

## 2020-10-29 ENCOUNTER — Encounter (HOSPITAL_COMMUNITY)
Admission: RE | Admit: 2020-10-29 | Discharge: 2020-10-29 | Disposition: A | Discharge status: Home / Self Care | Payer: Medicare Other | Source: Ambulatory Visit | Attending: Orthopaedic Surgery | Admitting: Orthopaedic Surgery

## 2020-10-29 ENCOUNTER — Ambulatory Visit (INDEPENDENT_AMBULATORY_CARE_PROVIDER_SITE_OTHER): Payer: Medicare Other | Admitting: Surgery

## 2020-10-29 ENCOUNTER — Other Ambulatory Visit: Payer: Self-pay

## 2020-10-29 ENCOUNTER — Encounter: Payer: Self-pay | Admitting: Surgery

## 2020-10-29 ENCOUNTER — Encounter (HOSPITAL_COMMUNITY): Payer: Self-pay

## 2020-10-29 VITALS — BP 144/85 | HR 64 | Ht 60.0 in | Wt 137.0 lb

## 2020-10-29 DIAGNOSIS — M1711 Unilateral primary osteoarthritis, right knee: Secondary | ICD-10-CM

## 2020-10-29 DIAGNOSIS — Z01818 Encounter for other preprocedural examination: Secondary | ICD-10-CM | POA: Insufficient documentation

## 2020-10-29 LAB — COMPREHENSIVE METABOLIC PANEL
ALT: 34 U/L (ref 0–44)
AST: 48 U/L — ABNORMAL HIGH (ref 15–41)
Albumin: 3.4 g/dL — ABNORMAL LOW (ref 3.5–5.0)
Alkaline Phosphatase: 79 U/L (ref 38–126)
Anion gap: 8 (ref 5–15)
BUN: 18 mg/dL (ref 8–23)
CO2: 24 mmol/L (ref 22–32)
Calcium: 9.3 mg/dL (ref 8.9–10.3)
Chloride: 110 mmol/L (ref 98–111)
Creatinine, Ser: 0.99 mg/dL (ref 0.44–1.00)
GFR, Estimated: >60 mL/min (ref >60)
Glucose, Bld: 83 mg/dL (ref 70–99)
Potassium: 3.8 mmol/L (ref 3.5–5.1)
Sodium: 142 mmol/L (ref 135–145)
Total Bilirubin: 1.4 mg/dL — ABNORMAL HIGH (ref 0.3–1.2)
Total Protein: 6.1 g/dL — ABNORMAL LOW (ref 6.5–8.1)

## 2020-10-29 LAB — CBC
HCT: 34.9 % — ABNORMAL LOW (ref 36.0–46.0)
Hemoglobin: 11.5 g/dL — ABNORMAL LOW (ref 12.0–15.0)
MCH: 34 pg (ref 26.0–34.0)
MCHC: 33 g/dL (ref 30.0–36.0)
MCV: 103.3 fL — ABNORMAL HIGH (ref 80.0–100.0)
Platelets: 127 10*3/uL — ABNORMAL LOW (ref 150–400)
RBC: 3.38 MIL/uL — ABNORMAL LOW (ref 3.87–5.11)
RDW: 13.3 % (ref 11.5–15.5)
WBC: 3.9 10*3/uL — ABNORMAL LOW (ref 4.0–10.5)
nRBC: 0 % (ref 0.0–0.2)

## 2020-10-29 LAB — SURGICAL PCR SCREEN
MRSA, PCR: NEGATIVE
Staphylococcus aureus: NEGATIVE

## 2020-10-29 LAB — HEMOGLOBIN A1C
Hgb A1c MFr Bld: 4.1 % — ABNORMAL LOW (ref 4.8–5.6)
Mean Plasma Glucose: 70.97 mg/dL

## 2020-10-29 LAB — GLUCOSE, CAPILLARY: Glucose-Capillary: 75 mg/dL (ref 70–99)

## 2020-10-29 NOTE — Pre-Procedure Instructions (Addendum)
Your procedure is scheduled on Wednesday, December 15th at 12:30.  Report to [facility] Main Entrance "A" at 10:30 A.M., and check in at the Admitting office.  Call this number if you have problems the morning of surgery:  [phone]  Call [phone] if you have any questions prior to your surgery date Monday-Friday 8am-4pm   Do not eat or drink after midnight before your surgery.   Take these medicines the morning of surgery with A SIP OF WATER  atorvastatin (LIPITOR) BREO ELLIPTA  famotidine (PEPCID) levothyroxine (SYNTHROID)   Take these medications as needed the morning of surgery acetaminophen (TYLENOL) albuterol (VENTOLIN HFA)-nebulizer albuterol (PROVENTIL)-please bring inhaler with you to the hospital.  ondansetron (ZOFRAN-ODT)  As of today, STOP taking any Aspirin (unless otherwise instructed by your surgeon) Aleve, Naproxen, Ibuprofen, Motrin, Advil, Goody's, BC's, all herbal medications, fish oil, and all vitamins.             Do not wear jewelry, make up, or nail polish            Do not wear lotions, powders, perfumes, or deodorant.            Do not shave 48 hours prior to surgery.              Do not bring valuables to the hospital.            [facility] is not responsible for any belongings or valuables.  Do NOT Smoke (Tobacco/Vaping) or drink Alcohol 24 hours prior to your procedure If you use a CPAP at night, you may bring all equipment for your overnight stay.   Contacts, glasses, dentures or bridgework may not be worn into surgery.      For patients admitted to the hospital, discharge time will be determined by your treatment team.   Patients discharged the day of surgery will not be allowed to drive home, and someone needs to stay with them for 24 hours.    HOW TO MANAGE YOUR DIABETES BEFORE AND AFTER SURGERY  Why is it important to control my blood sugar before and after surgery? . Improving blood sugar levels before and after surgery helps  healing and can limit problems. . A way of improving blood sugar control is eating a healthy diet by: o  Eating less sugar and carbohydrates o  Increasing activity/exercise o  Talking with your doctor about reaching your blood sugar goals . High blood sugars (greater than 180 mg/dL) can raise your risk of infections and slow your recovery, so you will need to focus on controlling your diabetes during the weeks before surgery. . Make sure that the doctor who takes care of your diabetes knows about your planned surgery including the date and location.  How do I manage my blood sugar before surgery? . Check your blood sugar at least 4 times a day, starting 2 days before surgery, to make sure that the level is not too high or low. . Check your blood sugar the morning of your surgery when you wake up and every 2 hours until you get to the Short Stay unit. o If your blood sugar is less than 70 mg/dL, you will need to treat for low blood sugar: - Do not take insulin. - Treat a low blood sugar (less than 70 mg/dL) with  cup of clear juice (cranberry or apple), 4 glucose tablets, OR glucose gel. - Recheck blood sugar in 15 minutes after treatment (to make sure it is greater  than 70 mg/dL). If your blood sugar is not greater than 70 mg/dL on recheck, call [phone] for further instructions. . Report your blood sugar to the short stay nurse when you get to Short Stay.  . If you are admitted to the hospital after surgery: o Your blood sugar will be checked by the staff and you will probably be given insulin after surgery (instead of oral diabetes medicines) to make sure you have good blood sugar levels. o The goal for blood sugar control after surgery is 80-180 mg/dL.     Special instructions:   [facility]- Preparing For Surgery  Before surgery, you can play an important role. Because skin is not sterile, your skin needs to be as free of germs as possible. You can reduce the number of germs on  your skin by washing with CHG (chlorahexidine gluconate) Soap before surgery.  CHG is an antiseptic cleaner which kills germs and bonds with the skin to continue killing germs even after washing.    Oral Hygiene is also important to reduce your risk of infection.  Remember - BRUSH YOUR TEETH THE MORNING OF SURGERY WITH YOUR REGULAR TOOTHPASTE  Please do not use if you have an allergy to CHG or antibacterial soaps. If your skin becomes reddened/irritated stop using the CHG.  Do not shave (including legs and underarms) for at least 48 hours prior to first CHG shower. It is OK to shave your face.  Please follow these instructions carefully.   1. Shower the NIGHT BEFORE SURGERY and the MORNING OF SURGERY with CHG Soap.   2. If you chose to wash your hair, wash your hair first as usual with your normal shampoo.  3. After you shampoo, rinse your hair and body thoroughly to remove the shampoo.  4. Use CHG as you would any other liquid soap. You can apply CHG directly to the skin and wash gently with a scrungie or a clean washcloth.   5. Apply the CHG Soap to your body ONLY FROM THE NECK DOWN.  Do not use on open wounds or open sores. Avoid contact with your eyes, ears, mouth and genitals (private parts). Wash Face and genitals (private parts)  with your normal soap.   6. Wash thoroughly, paying special attention to the area where your surgery will be performed.  7. Thoroughly rinse your body with warm water from the neck down.  8. DO NOT shower/wash with your normal soap after using and rinsing off the CHG Soap.  9. Pat yourself dry with a CLEAN TOWEL.  10. Wear CLEAN PAJAMAS to bed the night before surgery  11. Place CLEAN SHEETS on your bed the night of your first shower and DO NOT SLEEP WITH PETS.   Day of Surgery: Wear Clean/Comfortable clothing the morning of surgery Do not apply any deodorants/lotions.   Remember to brush your teeth WITH YOUR REGULAR TOOTHPASTE.   Please read over  the following fact sheets that you were given.

## 2020-10-29 NOTE — Progress Notes (Signed)
64 year old white female history of end-stage DJD right ankle chronic pain comes in for preop evaluation.  States that knee symptoms unchanged from previous visit.  She is want to proceed with right total knee replacement as scheduled.  We have received preop cardiac and medical clearances.  Today history and physical performed.  Patient does have history of sleep apnea but does not use her CPAP.  She was also previously on home O2 but states that she she stopped using this because she got better after her bariatric surgery last year.  Assessment End-stage DJD right knee   Plan We will proceed with right total knee replacement as scheduled.  Surgical procedure discussed in great detail along with what is the expected postop.  Patient states that she has a husband at home but she will NOT have any assistance to help for 12 hours stretches daily.  Patient also stated that her husband is expecting her to continue cooking and cleaning with the aid of a rolling walker in the immediate postop period.  I advised patient that she would not be able to do these duties immediately after surgery and that this can be unsafe.  I advised patient that it may be in her best interest to have short-term skilled facility placement for rehab.  She will talk to her husband and son today about what we discussed in the clinic.  Hopefully they can accommodate her so she can have the best outcome from her surgery.  All questions answered.

## 2020-10-29 NOTE — Progress Notes (Signed)
PCP - Dr. [name] Cardiologist - Denies Bariatric: Dr. Eldridge Abrahams  PPM/ICD - Denies  Chest x-ray - N/A EKG - 10/29/20 Stress Test - 06/2014 ECHO - 08/05/2016 Cardiac Cath - Denies  Sleep Study - Yes CPAP - No  Fasting Blood Sugar - 42-78 Checks Blood Sugar __4___ times a day  Blood Thinner Instructions: N/A Aspirin Instructions: N/A  ERAS Protcol - No  COVID TEST- 11/01/20   Coronavirus Screening  Have you experienced the following symptoms:  Cough yes/no: No Fever (>100.56F)  yes/no: No Runny nose yes/no: No Sore throat yes/no: No Difficulty breathing/shortness of breath  yes/no: No  Have you or a family member traveled in the last 14 days and where? yes/no: No   If the patient indicates "YES" to the above questions, their PAT will be rescheduled to limit the exposure to others and, the surgeon will be notified. THE PATIENT WILL NEED TO BE ASYMPTOMATIC FOR 14 DAYS.   If the patient is not experiencing any of these symptoms, the PAT nurse will instruct them to NOT bring anyone with them to their appointment since they may have these symptoms or traveled as well.   Please remind your patients and families that hospital visitation restrictions are in effect and the importance of the restrictions.     Anesthesia review: Yes, cardiac hx; clearance in shadow chart  Patient denies shortness of breath, fever, cough and chest pain at PAT appointment   All instructions explained to the patient, with a verbal understanding of the material. Patient agrees to go over the instructions while at home for a better understanding. Patient also instructed to self quarantine after being tested for COVID-19. The opportunity to ask questions was provided.

## 2020-10-30 ENCOUNTER — Telehealth: Payer: Self-pay | Admitting: Orthopaedic Surgery

## 2020-10-30 NOTE — Telephone Encounter (Signed)
Patient calling with questions regarding physical therapy and rehab after her right total knee surgery. Surgery date is 11-05-20.  This case has been moved from [employer] to [place] due to operating rooms being closed and staff shortage.  Date and time for this case with Dr. Lorin Mercy will remain the same.    cb  [phone]

## 2020-10-30 NOTE — Telephone Encounter (Signed)
Thank you. I have her on my list to call tomorrow. I was out of office today. Thanks.

## 2020-10-30 NOTE — Progress Notes (Signed)
Anesthesia Chart Review:  History of multiple work-ups for chest pain that has been deemed to be noncardiac.  Most recently in 2017 she was seen in consultation by Dr. Harl Bowie for chest pain.  Per his note 08/05/2016, "long history of chest pain with multiple negative evaluations - current episodes is atypical chest pain. Ongoing for 3 days stragitht without relief, right chest wall is tender to palpation - no objective evidence of ACS by EKG or enzymes - will d/c heparin -will obtain echo, if normal then no further cardiac workup and cardiology will sign off."  Subsequent echo was normal, EF 55 to 60%, normal wall motion.  Chronic medical conditions followed by PCP Dr.Dhruv Vyas.  Saw patient 10/10/2020 for preop clearance.  Per note, "Patient is at low risk of cardiac complication from noncardiac surgery.  Patient is at risk for DVT due to knee replacement surgery and DVT prophylaxis recommended.  Medical consultation if needed.  Take BP meds with sip of water day of surgery.  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 Toney Rakes at [facility] health.  Patient states that since the surgery and subsequent weight loss she no longer needs CPAP or nocturnal oxygen.  COPD, maintained on Breo Ellipta.  Preop labs reviewed, unremarkable.  EKG 10/29/2020: Sinus bradycardia.  Rate 57.  Low voltage QRS.  No significant change.  TTE 08/05/2016: - Left ventricle: The cavity size was normal. Wall thickness was  increased in a pattern of moderate LVH. Systolic function was  normal. The estimated ejection fraction was in the range of 55%  to 60%. Wall motion was normal; there were no regional wall  motion abnormalities. Left ventricular diastolic function  parameters were normal.  - Aortic valve: Valve area (VTI): 1.77 cm^2. Valve area (Vmax): 1.6  cm^2.  - Technically adequate study.   Nuclear stress  07/03/2014: 1. Negative Lexiscan for ischemia  2. Normal LV systolic function, LVEF 67%. Normal wall motion.    Wynonia Musty [facility] Short Stay Center/Anesthesiology Phone [phone] 10/30/2020 1:13 PM

## 2020-10-30 NOTE — Anesthesia Preprocedure Evaluation (Addendum)
Anesthesia Evaluation  Patient identified by MRN, date of birth, ID band Patient awake    Reviewed: Allergy & Precautions, NPO status , Patient's Chart, lab work & pertinent test results  Airway Mallampati: II  TM Distance: >3 FB Neck ROM: Full  Mouth opening: Limited Mouth Opening  Dental no notable dental hx. (+) Teeth Intact   Pulmonary asthma (uses rescue inhaler once per month) , COPD,    Pulmonary exam normal breath sounds clear to auscultation       Cardiovascular + CAD (nonobstructive on cath 2008, nml stress test 2011)  Normal cardiovascular exam Rhythm:Regular Rate:Normal  Echo 2017: - Left ventricle: The cavity size was normal. Wall thickness was  increased in a pattern of moderate LVH. Systolic function was  normal. The estimated ejection fraction was in the range of 55%  to 60%. Wall motion was normal; there were no regional wall  motion abnormalities. Left ventricular diastolic function  parameters were normal.  - Aortic valve: Valve area (VTI): 1.77 cm^2. Valve area (Vmax): 1.6  cm^2.  - Technically adequate study.    Neuro/Psych  Headaches, Seizures - (childhood), Well Controlled,  PSYCHIATRIC DISORDERS Depression CVA (2015 some numbness right side)    GI/Hepatic Neg liver ROS, hiatal hernia, GERD  Medicated and Controlled,Bariatric surgery 2020   Endo/Other  negative endocrine ROSHypothyroidism a1c 4.1, no longer diabetic since bariatric surgery  Renal/GU negative Renal ROS  negative genitourinary   Musculoskeletal  (+) Arthritis , Osteoarthritis,  Fibromyalgia -Right knee OA Spinal stenosis, previous back surgery 2015 with some residual neuropathy RLE   Abdominal   Peds  Hematology negative hematology ROS (+) hct 34.9, plt 127   Anesthesia Other Findings   Reproductive/Obstetrics negative OB ROS                           Anesthesia Physical Anesthesia  Plan  ASA: III  Anesthesia Plan: Spinal, Regional and MAC   Post-op Pain Management:    Induction:   PONV Risk Score and Plan: 2 and Propofol infusion and TIVA  Airway Management Planned: Natural Airway and Nasal Cannula  Additional Equipment: None  Intra-op Plan:   Post-operative Plan:   Informed Consent: I have reviewed the patients History and Physical, chart, labs and discussed the procedure including the risks, benefits and alternatives for the proposed anesthesia with the patient or authorized representative who has indicated his/her understanding and acceptance.       Plan Discussed with: CRNA  Anesthesia Plan Comments: (PAT note by Karoline Caldwell, PA-C: History of multiple work-ups for chest pain that has been deemed to be noncardiac.  Most recently in 2017 she was seen in consultation by Dr. Harl Bowie for chest pain.  Per his note 08/05/2016, "long history of chest pain with multiple negative evaluations - current episodes is atypical chest pain. Ongoing for 3 days stragitht without relief, right chest wall is tender to palpation - no objective evidence of ACS by EKG or enzymes - will d/c heparin -will obtain echo, if normal then no further cardiac workup and cardiology will sign off."  Subsequent echo was normal, EF 55 to 60%, normal wall motion.  Chronic medical conditions followed by PCP Dr.Dhruv Vyas.  Saw patient 10/10/2020 for preop clearance.  Per note, "Patient is at low risk of cardiac complication from noncardiac surgery.  Patient is at risk for DVT due to knee replacement surgery and DVT prophylaxis recommended.  Medical consultation if needed.  Take BP  meds with sip of water day of surgery.  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 Toney Rakes at [facility] health.  Patient states that since the surgery and subsequent weight loss she no longer needs CPAP or nocturnal oxygen.  COPD,  maintained on Breo Ellipta.  Preop labs reviewed, unremarkable.  EKG 10/29/2020: Sinus bradycardia.  Rate 57.  Low voltage QRS.  No significant change.  TTE 08/05/2016: - Left ventricle: The cavity size was normal. Wall thickness was  increased in a pattern of moderate LVH. Systolic function was  normal. The estimated ejection fraction was in the range of 55%  to 60%. Wall motion was normal; there were no regional wall  motion abnormalities. Left ventricular diastolic function  parameters were normal.  - Aortic valve: Valve area (VTI): 1.77 cm^2. Valve area (Vmax): 1.6  cm^2.  - Technically adequate study.   Nuclear stress 07/03/2014: 1. Negative Lexiscan for ischemia  2. Normal LV systolic function, LVEF 83%. Normal wall motion.  )       Anesthesia Quick Evaluation

## 2020-10-31 ENCOUNTER — Encounter (HOSPITAL_COMMUNITY): Payer: Self-pay | Admitting: Orthopaedic Surgery

## 2020-10-31 ENCOUNTER — Telehealth: Payer: Self-pay | Admitting: *Deleted

## 2020-10-31 DIAGNOSIS — E113293 Type 2 diabetes mellitus with mild nonproliferative diabetic retinopathy without macular edema, bilateral: Secondary | ICD-10-CM | POA: Diagnosis not present

## 2020-10-31 DIAGNOSIS — H353231 Exudative age-related macular degeneration, bilateral, with active choroidal neovascularization: Secondary | ICD-10-CM | POA: Diagnosis not present

## 2020-10-31 DIAGNOSIS — H35371 Puckering of macula, right eye: Secondary | ICD-10-CM | POA: Diagnosis not present

## 2020-10-31 DIAGNOSIS — H43823 Vitreomacular adhesion, bilateral: Secondary | ICD-10-CM | POA: Diagnosis not present

## 2020-10-31 NOTE — Progress Notes (Deleted)
COVID Vaccine Completed: Date COVID Vaccine completed: COVID vaccine manufacturer: Pfizer    Moderna   [employer]   PCP - Glenda Chroman, MD Cardiologist - Dr. Carlyle Dolly  Chest x-ray - greater than 1 year EKG - 10/29/20 in epic Stress Test -  ECHO - greater than 2 years Cardiac Cath - greater than 2 years Pacemaker/ICD device last checked:  Sleep Study -  CPAP -   Fasting Blood Sugar -  Checks Blood Sugar _____ times a day  Blood Thinner Instructions: Aspirin Instructions: Last Dose:  Activity level:  Unable to go up a flight of stairs without symptoms   Can go up a flight of stairs without stopping and without symptoms   Able to exercise without symptoms     Anesthesia review: reviewed by Karoline Caldwell PA 10/29/20  Patient denies shortness of breath, fever, cough and chest pain at PAT appointment   Patient verbalized understanding of instructions that were given to them at the PAT appointment. Patient was also instructed that they will need to review over the PAT instructions again at home before surgery.

## 2020-10-31 NOTE — Telephone Encounter (Signed)
Attempted Ortho bundle pre-op call. No answer and left VM requesting call back.

## 2020-11-01 ENCOUNTER — Other Ambulatory Visit (HOSPITAL_COMMUNITY)
Admission: RE | Admit: 2020-11-01 | Discharge: 2020-11-01 | Disposition: A | Discharge status: Home / Self Care | Payer: Medicare Other | Source: Ambulatory Visit | Attending: Orthopaedic Surgery | Admitting: Orthopaedic Surgery

## 2020-11-01 DIAGNOSIS — Z01812 Encounter for preprocedural laboratory examination: Secondary | ICD-10-CM | POA: Insufficient documentation

## 2020-11-01 DIAGNOSIS — Z20822 Contact with and (suspected) exposure to covid-19: Secondary | ICD-10-CM | POA: Insufficient documentation

## 2020-11-02 LAB — SARS CORONAVIRUS 2 (TAT 6-24 HRS): SARS Coronavirus 2: NEGATIVE

## 2020-11-04 ENCOUNTER — Telehealth: Payer: Self-pay | Admitting: *Deleted

## 2020-11-04 ENCOUNTER — Other Ambulatory Visit (HOSPITAL_COMMUNITY): Payer: Medicare Other

## 2020-11-04 NOTE — Telephone Encounter (Signed)
Ortho bundle pre-op call completed. 

## 2020-11-04 NOTE — Care Plan (Signed)
RNCM call to patient to discuss her upcoming Right total knee replacement with Dr. Lorin Mercy on 11/05/20. Patient lives with her husband and states he works 12 hour shifts, but will be off beginning 11/11/20 for the Christmas holiday. She discussed going to a SNF after her surgery, but after discussion, she is agreeable to home with HHPT services. Will see how she does after surgery with therapy prior to discharge.She will need a BSC/3in1 and FWW, which RNCM will order through [facility]. Choice provided and referral made to [facility]. . Patient requested OPPT be near her home in [place]. CM will assist with setting this up. Will continue to follow for needs.

## 2020-11-05 ENCOUNTER — Other Ambulatory Visit: Payer: Self-pay

## 2020-11-05 ENCOUNTER — Observation Stay (HOSPITAL_COMMUNITY)
Admission: RE | Admit: 2020-11-05 | Discharge: 2020-11-07 | Disposition: A | Discharge status: Home / Self Care | Payer: Medicare Other | Attending: Orthopaedic Surgery | Admitting: Orthopaedic Surgery

## 2020-11-05 ENCOUNTER — Ambulatory Visit (HOSPITAL_COMMUNITY): Payer: Medicare Other | Admitting: Certified Registered Nurse Anesthetist

## 2020-11-05 ENCOUNTER — Ambulatory Visit (HOSPITAL_COMMUNITY): Payer: Medicare Other | Admitting: Physician Assistant

## 2020-11-05 ENCOUNTER — Encounter (HOSPITAL_COMMUNITY)
Admission: RE | Disposition: A | Discharge status: Home / Self Care | Payer: Self-pay | Source: Home / Self Care | Attending: Orthopaedic Surgery

## 2020-11-05 ENCOUNTER — Encounter (HOSPITAL_COMMUNITY): Payer: Self-pay | Admitting: Orthopaedic Surgery

## 2020-11-05 DIAGNOSIS — J45909 Unspecified asthma, uncomplicated: Secondary | ICD-10-CM | POA: Insufficient documentation

## 2020-11-05 DIAGNOSIS — I2510 Atherosclerotic heart disease of native coronary artery without angina pectoris: Secondary | ICD-10-CM | POA: Insufficient documentation

## 2020-11-05 DIAGNOSIS — E039 Hypothyroidism, unspecified: Secondary | ICD-10-CM | POA: Insufficient documentation

## 2020-11-05 DIAGNOSIS — Z79899 Other long term (current) drug therapy: Secondary | ICD-10-CM | POA: Diagnosis not present

## 2020-11-05 DIAGNOSIS — Z20822 Contact with and (suspected) exposure to covid-19: Secondary | ICD-10-CM | POA: Insufficient documentation

## 2020-11-05 DIAGNOSIS — I110 Hypertensive heart disease with heart failure: Secondary | ICD-10-CM | POA: Insufficient documentation

## 2020-11-05 DIAGNOSIS — J449 Chronic obstructive pulmonary disease, unspecified: Secondary | ICD-10-CM | POA: Insufficient documentation

## 2020-11-05 DIAGNOSIS — M1711 Unilateral primary osteoarthritis, right knee: Secondary | ICD-10-CM | POA: Diagnosis not present

## 2020-11-05 DIAGNOSIS — E119 Type 2 diabetes mellitus without complications: Secondary | ICD-10-CM | POA: Insufficient documentation

## 2020-11-05 DIAGNOSIS — I5032 Chronic diastolic (congestive) heart failure: Secondary | ICD-10-CM | POA: Insufficient documentation

## 2020-11-05 DIAGNOSIS — Z91040 Latex allergy status: Secondary | ICD-10-CM | POA: Diagnosis not present

## 2020-11-05 DIAGNOSIS — I10 Essential (primary) hypertension: Secondary | ICD-10-CM | POA: Diagnosis not present

## 2020-11-05 DIAGNOSIS — M1712 Unilateral primary osteoarthritis, left knee: Secondary | ICD-10-CM | POA: Diagnosis not present

## 2020-11-05 DIAGNOSIS — G8918 Other acute postprocedural pain: Secondary | ICD-10-CM | POA: Diagnosis not present

## 2020-11-05 DIAGNOSIS — M25561 Pain in right knee: Secondary | ICD-10-CM | POA: Diagnosis present

## 2020-11-05 HISTORY — PX: TOTAL KNEE ARTHROPLASTY: SHX125

## 2020-11-05 HISTORY — DX: Unspecified cirrhosis of liver: K74.60

## 2020-11-05 LAB — CBC
HCT: 31.8 % — ABNORMAL LOW (ref 36.0–46.0)
Hemoglobin: 10.9 g/dL — ABNORMAL LOW (ref 12.0–15.0)
MCH: 34.9 pg — ABNORMAL HIGH (ref 26.0–34.0)
MCHC: 34.3 g/dL (ref 30.0–36.0)
MCV: 101.9 fL — ABNORMAL HIGH (ref 80.0–100.0)
Platelets: 90 10*3/uL — ABNORMAL LOW (ref 150–400)
RBC: 3.12 MIL/uL — ABNORMAL LOW (ref 3.87–5.11)
RDW: 13.7 % (ref 11.5–15.5)
WBC: 2.4 10*3/uL — ABNORMAL LOW (ref 4.0–10.5)
nRBC: 0 % (ref 0.0–0.2)

## 2020-11-05 LAB — CREATININE, SERUM
Creatinine, Ser: 0.97 mg/dL (ref 0.44–1.00)
GFR, Estimated: >60 mL/min (ref >60)

## 2020-11-05 LAB — GLUCOSE, CAPILLARY
Glucose-Capillary: 80 mg/dL (ref 70–99)
Glucose-Capillary: 85 mg/dL (ref 70–99)
Glucose-Capillary: 265 mg/dL — ABNORMAL HIGH (ref 70–99)
Glucose-Capillary: 251 mg/dL — ABNORMAL HIGH (ref 70–99)

## 2020-11-05 SURGERY — ARTHROPLASTY, KNEE, TOTAL
Anesthesia: Monitor Anesthesia Care | Site: Knee | Laterality: Right

## 2020-11-05 MED ORDER — ACETAMINOPHEN 325 MG PO TABS: 325.0000 mg | ORAL_TABLET | Freq: Four times a day (QID) | ORAL | Status: DC | PRN

## 2020-11-05 MED ORDER — ONDANSETRON HCL 4 MG/2ML IJ SOLN
INTRAMUSCULAR | Status: DC | PRN
  Administered 2020-11-05: 4 mg via INTRAVENOUS

## 2020-11-05 MED ORDER — CHLORHEXIDINE GLUCONATE 0.12 % MT SOLN
15.0000 mL | Freq: Once | OROMUCOSAL | Status: AC
  Administered 2020-11-05: 11:00:00 15 mL via OROMUCOSAL

## 2020-11-05 MED ORDER — FLUTICASONE FUROATE-VILANTEROL 200-25 MCG/INH IN AEPB: 1.0000 | INHALATION_SPRAY | Freq: Every day | RESPIRATORY_TRACT | Status: DC

## 2020-11-05 MED ORDER — MIDAZOLAM HCL 2 MG/2ML IJ SOLN
1.0000 mg | INTRAMUSCULAR | Status: DC
  Administered 2020-11-05: 12:00:00 1 mg via INTRAVENOUS
  Filled 2020-11-05: qty 2

## 2020-11-05 MED ORDER — FAMOTIDINE 20 MG PO TABS
40.0000 mg | ORAL_TABLET | Freq: Every morning | ORAL | Status: DC
  Administered 2020-11-06 – 2020-11-07 (×2): 40 mg via ORAL
  Filled 2020-11-05 (×2): qty 2

## 2020-11-05 MED ORDER — OXYCODONE HCL 5 MG PO TABS: 5.0000 mg | ORAL_TABLET | Freq: Once | ORAL | Status: DC | PRN

## 2020-11-05 MED ORDER — LIDOCAINE HCL (PF) 2 % IJ SOLN
INTRAMUSCULAR | Status: AC
  Filled 2020-11-05: qty 5

## 2020-11-05 MED ORDER — BUPIVACAINE LIPOSOME 1.3 % IJ SUSP
INTRAMUSCULAR | Status: DC | PRN
  Administered 2020-11-05: 20 mL

## 2020-11-05 MED ORDER — FENTANYL CITRATE (PF) 100 MCG/2ML IJ SOLN
50.0000 ug | INTRAMUSCULAR | Status: DC
  Administered 2020-11-05: 12:00:00 50 ug via INTRAVENOUS
  Filled 2020-11-05: qty 2

## 2020-11-05 MED ORDER — PROPOFOL 1000 MG/100ML IV EMUL
INTRAVENOUS | Status: AC
  Filled 2020-11-05: qty 100

## 2020-11-05 MED ORDER — FLUTICASONE FUROATE-VILANTEROL 200-25 MCG/INH IN AEPB
1.0000 | INHALATION_SPRAY | Freq: Every day | RESPIRATORY_TRACT | Status: DC
  Administered 2020-11-06 – 2020-11-07 (×2): 1 via RESPIRATORY_TRACT
  Filled 2020-11-05: qty 28

## 2020-11-05 MED ORDER — 0.9 % SODIUM CHLORIDE (POUR BTL) OPTIME
TOPICAL | Status: DC | PRN
  Administered 2020-11-05: 14:00:00 1000 mL

## 2020-11-05 MED ORDER — ONDANSETRON HCL 4 MG/2ML IJ SOLN
INTRAMUSCULAR | Status: AC
  Filled 2020-11-05: qty 2

## 2020-11-05 MED ORDER — ONDANSETRON HCL 4 MG/2ML IJ SOLN: 4.0000 mg | Freq: Once | INTRAMUSCULAR | Status: DC | PRN

## 2020-11-05 MED ORDER — METOCLOPRAMIDE HCL 5 MG PO TABS: 5.0000 mg | ORAL_TABLET | Freq: Three times a day (TID) | ORAL | Status: DC | PRN

## 2020-11-05 MED ORDER — DEXAMETHASONE SODIUM PHOSPHATE 10 MG/ML IJ SOLN
INTRAMUSCULAR | Status: DC | PRN
  Administered 2020-11-05: 13:00:00 10 mg via INTRAVENOUS

## 2020-11-05 MED ORDER — DEXAMETHASONE SODIUM PHOSPHATE 10 MG/ML IJ SOLN
INTRAMUSCULAR | Status: AC
  Filled 2020-11-05: qty 1

## 2020-11-05 MED ORDER — FERROUS SULFATE 325 (65 FE) MG PO TABS
325.0000 mg | ORAL_TABLET | Freq: Every day | ORAL | Status: DC
  Administered 2020-11-06 – 2020-11-07 (×2): 325 mg via ORAL
  Filled 2020-11-05 (×2): qty 1

## 2020-11-05 MED ORDER — PROPOFOL 10 MG/ML IV BOLUS
INTRAVENOUS | Status: DC | PRN
  Administered 2020-11-05: 40 mg via INTRAVENOUS

## 2020-11-05 MED ORDER — DOCUSATE SODIUM 100 MG PO CAPS
100.0000 mg | ORAL_CAPSULE | Freq: Two times a day (BID) | ORAL | Status: DC
  Administered 2020-11-05 – 2020-11-07 (×4): 100 mg via ORAL
  Filled 2020-11-05 (×4): qty 1

## 2020-11-05 MED ORDER — SODIUM CHLORIDE 0.9 % IV SOLN: INTRAVENOUS | Status: DC

## 2020-11-05 MED ORDER — ORAL CARE MOUTH RINSE: 15.0000 mL | Freq: Once | OROMUCOSAL | Status: AC

## 2020-11-05 MED ORDER — HYDROMORPHONE HCL 1 MG/ML IJ SOLN
0.5000 mg | INTRAMUSCULAR | Status: DC | PRN
  Administered 2020-11-05 (×2): 0.5 mg via INTRAVENOUS
  Filled 2020-11-05 (×2): qty 0.5

## 2020-11-05 MED ORDER — TRANEXAMIC ACID-NACL 1000-0.7 MG/100ML-% IV SOLN
INTRAVENOUS | Status: AC
  Filled 2020-11-05: qty 100

## 2020-11-05 MED ORDER — VITAMIN B-12 1000 MCG PO TABS
5000.0000 ug | ORAL_TABLET | ORAL | Status: DC
  Administered 2020-11-06: 10:00:00 5000 ug via ORAL
  Filled 2020-11-05: qty 5

## 2020-11-05 MED ORDER — ALBUTEROL SULFATE (2.5 MG/3ML) 0.083% IN NEBU: 2.5000 mg | INHALATION_SOLUTION | Freq: Four times a day (QID) | RESPIRATORY_TRACT | Status: DC | PRN

## 2020-11-05 MED ORDER — METHOCARBAMOL 500 MG PO TABS
500.0000 mg | ORAL_TABLET | Freq: Four times a day (QID) | ORAL | Status: DC | PRN
  Administered 2020-11-05 – 2020-11-07 (×7): 500 mg via ORAL
  Filled 2020-11-05 (×7): qty 1

## 2020-11-05 MED ORDER — ROPIVACAINE HCL 5 MG/ML IJ SOLN
INTRAMUSCULAR | Status: DC | PRN
  Administered 2020-11-05: 30 mL via PERINEURAL

## 2020-11-05 MED ORDER — DIPHENHYDRAMINE HCL 25 MG PO CAPS
25.0000 mg | ORAL_CAPSULE | Freq: Four times a day (QID) | ORAL | Status: DC | PRN
  Administered 2020-11-06 (×2): 25 mg via ORAL
  Filled 2020-11-05 (×2): qty 1

## 2020-11-05 MED ORDER — MENTHOL 3 MG MT LOZG: 1.0000 | LOZENGE | OROMUCOSAL | Status: DC | PRN

## 2020-11-05 MED ORDER — OXYCODONE HCL 5 MG/5ML PO SOLN
5.0000 mg | Freq: Once | ORAL | Status: DC | PRN
Start: 2020-11-05 — End: 2020-11-05

## 2020-11-05 MED ORDER — EPHEDRINE SULFATE-NACL 50-0.9 MG/10ML-% IV SOSY
PREFILLED_SYRINGE | INTRAVENOUS | Status: DC | PRN
  Administered 2020-11-05 (×3): 10 mg via INTRAVENOUS

## 2020-11-05 MED ORDER — BUPIVACAINE LIPOSOME 1.3 % IJ SUSP
20.0000 mL | Freq: Once | INTRAMUSCULAR | Status: DC
  Filled 2020-11-05: qty 20

## 2020-11-05 MED ORDER — PROPOFOL 10 MG/ML IV BOLUS
INTRAVENOUS | Status: AC
  Filled 2020-11-05: qty 20

## 2020-11-05 MED ORDER — LEVOTHYROXINE SODIUM 112 MCG PO TABS
112.0000 ug | ORAL_TABLET | Freq: Every day | ORAL | Status: DC
  Administered 2020-11-06 – 2020-11-07 (×2): 112 ug via ORAL
  Filled 2020-11-05 (×2): qty 1

## 2020-11-05 MED ORDER — ENOXAPARIN SODIUM 30 MG/0.3ML ~~LOC~~ SOLN
30.0000 mg | Freq: Two times a day (BID) | SUBCUTANEOUS | Status: DC
  Administered 2020-11-06 – 2020-11-07 (×3): 30 mg via SUBCUTANEOUS
  Filled 2020-11-05 (×3): qty 0.3

## 2020-11-05 MED ORDER — PHENOL 1.4 % MT LIQD: 1.0000 | OROMUCOSAL | Status: DC | PRN

## 2020-11-05 MED ORDER — OXYCODONE HCL 5 MG PO TABS
5.0000 mg | ORAL_TABLET | Freq: Four times a day (QID) | ORAL | Status: DC | PRN
  Administered 2020-11-05 – 2020-11-07 (×6): 5 mg via ORAL
  Filled 2020-11-05 (×6): qty 1

## 2020-11-05 MED ORDER — POLYETHYLENE GLYCOL 3350 17 G PO PACK: 17.0000 g | PACK | Freq: Every day | ORAL | Status: DC | PRN

## 2020-11-05 MED ORDER — ATORVASTATIN CALCIUM 10 MG PO TABS
10.0000 mg | ORAL_TABLET | Freq: Every day | ORAL | Status: DC
  Administered 2020-11-06 – 2020-11-07 (×2): 10 mg via ORAL
  Filled 2020-11-05 (×2): qty 1

## 2020-11-05 MED ORDER — BUPIVACAINE HCL 0.25 % IJ SOLN
INTRAMUSCULAR | Status: AC
  Filled 2020-11-05: qty 1

## 2020-11-05 MED ORDER — EPHEDRINE 5 MG/ML INJ
INTRAVENOUS | Status: AC
  Filled 2020-11-05: qty 10

## 2020-11-05 MED ORDER — FENTANYL CITRATE (PF) 100 MCG/2ML IJ SOLN
INTRAMUSCULAR | Status: AC
  Filled 2020-11-05: qty 2

## 2020-11-05 MED ORDER — METHOCARBAMOL 500 MG IVPB - SIMPLE MED
500.0000 mg | Freq: Four times a day (QID) | INTRAVENOUS | Status: DC | PRN
  Filled 2020-11-05: qty 50

## 2020-11-05 MED ORDER — LACTATED RINGERS IV SOLN: INTRAVENOUS | Status: DC

## 2020-11-05 MED ORDER — MEPERIDINE HCL 50 MG/ML IJ SOLN: 6.2500 mg | INTRAMUSCULAR | Status: DC | PRN

## 2020-11-05 MED ORDER — SPIRONOLACTONE 25 MG PO TABS
25.0000 mg | ORAL_TABLET | Freq: Every day | ORAL | Status: DC
  Administered 2020-11-05 – 2020-11-07 (×3): 25 mg via ORAL
  Filled 2020-11-05 (×3): qty 1

## 2020-11-05 MED ORDER — SODIUM CHLORIDE 0.9 % IR SOLN
Status: DC | PRN
  Administered 2020-11-05: 1000 mL

## 2020-11-05 MED ORDER — HYDROMORPHONE HCL 1 MG/ML IJ SOLN: 0.2500 mg | INTRAMUSCULAR | Status: DC | PRN

## 2020-11-05 MED ORDER — ONDANSETRON HCL 4 MG/2ML IJ SOLN
4.0000 mg | Freq: Four times a day (QID) | INTRAMUSCULAR | Status: DC | PRN
  Administered 2020-11-05: 20:00:00 4 mg via INTRAVENOUS
  Filled 2020-11-05: qty 2

## 2020-11-05 MED ORDER — METOCLOPRAMIDE HCL 5 MG/ML IJ SOLN: 5.0000 mg | Freq: Three times a day (TID) | INTRAMUSCULAR | Status: DC | PRN

## 2020-11-05 MED ORDER — CEFAZOLIN SODIUM-DEXTROSE 2-4 GM/100ML-% IV SOLN
2.0000 g | INTRAVENOUS | Status: AC
  Administered 2020-11-05: 13:00:00 2 g via INTRAVENOUS
  Filled 2020-11-05: qty 100

## 2020-11-05 MED ORDER — BUPIVACAINE IN DEXTROSE 0.75-8.25 % IT SOLN
INTRATHECAL | Status: DC | PRN
  Administered 2020-11-05: 2 mL via INTRATHECAL

## 2020-11-05 MED ORDER — DEXAMETHASONE SODIUM PHOSPHATE 10 MG/ML IJ SOLN
INTRAMUSCULAR | Status: DC | PRN
  Administered 2020-11-05: 5 mg via INTRAVENOUS

## 2020-11-05 MED ORDER — PROPOFOL 500 MG/50ML IV EMUL
INTRAVENOUS | Status: DC | PRN
  Administered 2020-11-05: 75 ug/kg/min via INTRAVENOUS

## 2020-11-05 MED ORDER — TRANEXAMIC ACID-NACL 1000-0.7 MG/100ML-% IV SOLN
INTRAVENOUS | Status: DC | PRN
  Administered 2020-11-05: 1000 mg via INTRAVENOUS

## 2020-11-05 MED ORDER — ZINC SULFATE 220 (50 ZN) MG PO CAPS
220.0000 mg | ORAL_CAPSULE | Freq: Every day | ORAL | Status: DC
  Administered 2020-11-05 – 2020-11-07 (×3): 220 mg via ORAL
  Filled 2020-11-05 (×3): qty 1

## 2020-11-05 MED ORDER — ONDANSETRON HCL 4 MG PO TABS: 4.0000 mg | ORAL_TABLET | Freq: Four times a day (QID) | ORAL | Status: DC | PRN

## 2020-11-05 MED ORDER — MIDAZOLAM HCL 2 MG/2ML IJ SOLN
INTRAMUSCULAR | Status: AC
  Filled 2020-11-05: qty 2

## 2020-11-05 MED ORDER — STERILE WATER FOR IRRIGATION IR SOLN
Status: DC | PRN
  Administered 2020-11-05: 2000 mL

## 2020-11-05 MED ORDER — BUPIVACAINE HCL (PF) 0.25 % IJ SOLN
INTRAMUSCULAR | Status: DC | PRN
  Administered 2020-11-05: 20 mL

## 2020-11-05 MED ORDER — ALBUTEROL SULFATE HFA 108 (90 BASE) MCG/ACT IN AERS: 2.0000 | INHALATION_SPRAY | Freq: Four times a day (QID) | RESPIRATORY_TRACT | Status: DC | PRN

## 2020-11-05 SURGICAL SUPPLY — 67 items
ATTUNE PS FEM RT SZ 5 CEM KNEE (Femur) ×3 IMPLANT
ATTUNE PSRP INSR SZ5 5 KNEE (Insert) ×2 IMPLANT
ATTUNE PSRP INSR SZ5 5MM KNEE (Insert) ×1 IMPLANT
BAG ZIPLOCK 12X15 (MISCELLANEOUS) ×3 IMPLANT
BASEPLATE TIBIAL ROTATING SZ 4 (Knees) ×3 IMPLANT
BLADE SAGITTAL 25.0X1.19X90 (BLADE) ×2 IMPLANT
BLADE SAGITTAL 25.0X1.19X90MM (BLADE) ×1
BLADE SAW SGTL 13.0X1.19X90.0M (BLADE) ×3 IMPLANT
BLADE SURG SZ10 CARB STEEL (BLADE) ×6 IMPLANT
BNDG ELASTIC 4X5.8 VLCR STR LF (GAUZE/BANDAGES/DRESSINGS) ×3 IMPLANT
BNDG ELASTIC 6X10 VLCR STRL LF (GAUZE/BANDAGES/DRESSINGS) ×3 IMPLANT
BNDG ELASTIC 6X5.8 VLCR STR LF (GAUZE/BANDAGES/DRESSINGS) ×3 IMPLANT
CEMENT HV SMART SET (Cement) ×6 IMPLANT
COVER SURGICAL LIGHT HANDLE (MISCELLANEOUS) ×3 IMPLANT
COVER WAND RF STERILE (DRAPES) IMPLANT
CUFF TOURN SGL QUICK 34 (TOURNIQUET CUFF) ×3
CUFF TRNQT CYL 34X4.125X (TOURNIQUET CUFF) ×1 IMPLANT
DECANTER SPIKE VIAL GLASS SM (MISCELLANEOUS) IMPLANT
DRAPE ORTHO SPLIT 77X108 STRL (DRAPES) ×6
DRAPE SURG ORHT 6 SPLT 77X108 (DRAPES) ×2 IMPLANT
DRAPE U-SHAPE 47X51 STRL (DRAPES) ×3 IMPLANT
DRSG MEPILEX BORDER 4X12 (GAUZE/BANDAGES/DRESSINGS) ×3 IMPLANT
DRSG MEPILEX BORDER 4X8 (GAUZE/BANDAGES/DRESSINGS) ×3 IMPLANT
DRSG PAD ABDOMINAL 8X10 ST (GAUZE/BANDAGES/DRESSINGS) ×3 IMPLANT
DURAPREP 26ML APPLICATOR (WOUND CARE) ×6 IMPLANT
ELECT REM PT RETURN 15FT ADLT (MISCELLANEOUS) ×3 IMPLANT
GAUZE XEROFORM 5X9 LF (GAUZE/BANDAGES/DRESSINGS) ×3 IMPLANT
GLOVE BIOGEL PI IND STRL 8 (GLOVE) ×2 IMPLANT
GLOVE BIOGEL PI INDICATOR 8 (GLOVE) ×4
GLOVE ORTHO TXT STRL SZ7.5 (GLOVE) ×6 IMPLANT
GOWN STRL REUS W/TWL 2XL LVL3 (GOWN DISPOSABLE) ×3 IMPLANT
GOWN STRL REUS W/TWL LRG LVL3 (GOWN DISPOSABLE) ×3 IMPLANT
HANDPIECE INTERPULSE COAX TIP (DISPOSABLE) ×3
HOLDER FOLEY CATH W/STRAP (MISCELLANEOUS) IMPLANT
IMMOBILIZER KNEE 20 (SOFTGOODS) ×3
IMMOBILIZER KNEE 20 THIGH 36 (SOFTGOODS) ×1 IMPLANT
KIT TURNOVER KIT A (KITS) IMPLANT
NDL SAFETY ECLIPSE 18X1.5 (NEEDLE) IMPLANT
NEEDLE HYPO 18GX1.5 SHARP (NEEDLE)
NEEDLE HYPO 21X1.5 SAFETY (NEEDLE) ×3 IMPLANT
NS IRRIG 1000ML POUR BTL (IV SOLUTION) ×3 IMPLANT
PACK TOTAL KNEE CUSTOM (KITS) ×3 IMPLANT
PAD CAST 4YDX4 CTTN HI CHSV (CAST SUPPLIES) ×2 IMPLANT
PADDING CAST COTTON 4X4 STRL (CAST SUPPLIES) ×6
PADDING CAST COTTON 6X4 STRL (CAST SUPPLIES) ×3 IMPLANT
PATELLA MEDIAL ATTUN 35MM KNEE (Knees) ×3 IMPLANT
PENCIL SMOKE EVACUATOR (MISCELLANEOUS) IMPLANT
PIN DRILL FIX HALF THREAD (BIT) ×3 IMPLANT
PIN STEINMAN FIXATION KNEE (PIN) ×3 IMPLANT
PROTECTOR NERVE ULNAR (MISCELLANEOUS) ×3 IMPLANT
SET HNDPC FAN SPRY TIP SCT (DISPOSABLE) ×1 IMPLANT
SPONGE LAP 18X18 RF (DISPOSABLE) ×3 IMPLANT
STAPLER VISISTAT 35W (STAPLE) ×3 IMPLANT
SUT ETHIBOND NAB CT1 #1 30IN (SUTURE) ×6 IMPLANT
SUT VIC AB 0 CT1 27 (SUTURE) ×9
SUT VIC AB 0 CT1 27XBRD ANTBC (SUTURE) ×3 IMPLANT
SUT VIC AB 0 CT1 36 (SUTURE) ×3 IMPLANT
SUT VIC AB 1 CT1 27 (SUTURE) ×9
SUT VIC AB 1 CT1 27XBRD ANTBC (SUTURE) ×3 IMPLANT
SUT VIC AB 1 CTX 36 (SUTURE) ×6
SUT VIC AB 1 CTX36XBRD ANBCTR (SUTURE) ×2 IMPLANT
SUT VIC AB 2-0 CT1 27 (SUTURE) ×9
SUT VIC AB 2-0 CT1 TAPERPNT 27 (SUTURE) ×3 IMPLANT
SYR 3ML LL SCALE MARK (SYRINGE) IMPLANT
SYR CONTROL 10ML LL (SYRINGE) ×3 IMPLANT
TRAY FOLEY MTR SLVR 16FR STAT (SET/KITS/TRAYS/PACK) ×3 IMPLANT
WATER STERILE IRR 1000ML POUR (IV SOLUTION) ×3 IMPLANT

## 2020-11-05 NOTE — Anesthesia Procedure Notes (Signed)
Anesthesia Regional Block: Adductor canal block   Pre-Anesthetic Checklist: ,, timeout performed, Correct Patient, Correct Site, Correct Laterality, Correct Procedure, Correct Position, site marked, Risks and benefits discussed,  Surgical consent,  Pre-op evaluation,  At surgeon's request and post-op pain management  Laterality: Right  Prep: Maximum Sterile Barrier Precautions used, chloraprep       Needles:  Injection technique: Single-shot  Needle Type: Echogenic Stimulator Needle     Needle Length: 9cm  Needle Gauge: 22     Additional Needles:   Procedures:,,,, ultrasound used (permanent image in chart),,,,  Narrative:  Start time: 11/05/2020 12:20 PM End time: 11/05/2020 12:30 PM Injection made incrementally with aspirations every 5 mL.  Performed by: Personally  Anesthesiologist: Pervis Hocking, DO  Additional Notes: Monitors applied. No increased pain on injection. No increased resistance to injection. Injection made in 5cc increments. Good needle visualization. Patient tolerated procedure well.

## 2020-11-05 NOTE — Op Note (Signed)
Preop diagnosis: Right knee osteoarthritis, primary.  Postop diagnosis: right knee primary osteoarthritis  Procedure: Right total knee arthroplasty.  Surgeon: Rodell Perna, MD  Assistant: Benjiman Core, PA-C medically necessary and present with entire procedure  Anesthesia: Preoperative abductor block plus spinal +20 cc Marcaine 20 cc Exparel.  Implants:Depuy Attune size 5 regular with femur size 4 tibia 5 mm size 5 rotating platform.  35 mm patella.  Procedure: After preoperative abductor block proximal thigh tourniquet heel bump lateral post DuraPrep down to the toes was performed no normal total knee sheets drapes sterile skin marker impervious stockinette Coban Betadine Steri-Drape was applied.  Timeout procedure completed TXA given IV Ancef prophylaxis timeout procedure.  Midline incision was made after wrapping leg with Esmarch and placed a tourniquet 350.  Tourniquet time was less than 50 minutes total and was deflated at the end of the case prior to closure for hemostasis.  Medial retinacular incision was made splitting the quad tendon medial one third lateral two thirds.  Patient had mild varus bone-on-bone changes eburnated bone significant narrowing and closure of the notch and meniscal remnants were resected.  Intramedullary rod placed up the femur resection 10 mm distal on the femur followed by 9 on the tibia we came back took 2 additional since the spacer block was little tight only slightly more the medial collateral since patient had mild varus and more eburnated bone medial than lateral prior to cuts.  Chamfer cuts box cuts were made the femur keel preparation.  Size 4 on the tibia 5 mm block allowed full extension good collateral balance.  Pulsatile lavage fracture mixing the cement.  Tibia cemented followed by femur permanent rotating polywas inserted came out to full extension all excessive cement was removed.  Patella held with self-retaining clamp.  Exparel Marcaine was  infiltrated while cement was setting up in 15 minutes cement was hard pulse lavage terminated deflation, hemostasis obtained and then standard closure.  #1 Vicryl in the quad tendon medial capsular incision 2-0 Vicryl subtendinous tissue skin staple closure postop dressing knee immobilizer.  Patient supposed to go home has a husband at home also with son.  She had a niece who was supposed to be helping her after surgery but the niece is gotten a new job.  Patient states husband is still working and she has no one at home to help her and would need to go to a skilled facility.

## 2020-11-05 NOTE — Care Plan (Signed)
Ortho Bundle Case Management Note  Patient Details  Name: Kimberly [name] MRN: [id number] Date of Birth: August 13, 1956   [facility] call to patient to discuss her Right total knee replacement with Dr. Lorin Mercy. Patient is an Ortho bundle patient through [facility] and is agreeable to case management. She states she lives with her husband, who will be out for the Christmas holiday from work beginning 11/11/20 and can assist at home. Discussed possible need for SNF due to husband working, but she felt at end of conversation, she could have some assistance at her home until her husband is out for the holiday. Anticipate HHPT will be needed at discharge. Choice provided and referral made to [facility]. Patient will also need DME  (3in1/BSC, FWW) prior to discharge. Ordered through [facility]. Will continue to follow for needs.                        DME Arranged:  3-N-1,Walker rolling DME Agency:  [employer]  HH Arranged:  PT [place] Agency:  [facility] (now [facility])  Additional Comments: Please contact me with any questions of if this plan should need to change.  Jamse Arn, RN, BSN, [employer]  [phone] 11/05/2020, 2:50 PM

## 2020-11-05 NOTE — Anesthesia Procedure Notes (Signed)
Spinal  Patient location during procedure: OR Start time: 11/05/2020 12:58 PM End time: 11/05/2020 1:01 PM Staffing Performed: anesthesiologist  Anesthesiologist: Pervis Hocking, DO Preanesthetic Checklist Completed: patient identified, IV checked, risks and benefits discussed, surgical consent, monitors and equipment checked, pre-op evaluation and timeout performed Spinal Block Patient position: sitting Prep: DuraPrep and site prepped and draped Patient monitoring: cardiac monitor, continuous pulse ox and blood pressure Approach: midline Location: L3-4 Injection technique: single-shot Needle Needle type: Pencan  Needle gauge: 24 G Needle length: 9 cm Assessment Sensory level: T6 Additional Notes Functioning IV was confirmed and monitors were applied. Sterile prep and drape, including hand hygiene and sterile gloves were used. The patient was positioned and the spine was prepped. The skin was anesthetized with lidocaine.  Free flow of clear CSF was obtained prior to injecting local anesthetic into the CSF.  The spinal needle aspirated freely following injection.  The needle was carefully withdrawn.  The patient tolerated the procedure well.

## 2020-11-05 NOTE — Progress Notes (Signed)
AssistedDr. Criss Rosales with right, ultrasound guided, adductor canal block. Side rails up, monitors on throughout procedure. See vital signs in flow sheet. Tolerated Procedure well.

## 2020-11-05 NOTE — Anesthesia Procedure Notes (Signed)
Procedure Name: MAC Performed by: Safia Panzer L, CRNA Pre-anesthesia Checklist: Patient identified, Emergency Drugs available, Suction available, Patient being monitored and Timeout performed Patient Re-evaluated:Patient Re-evaluated prior to induction Oxygen Delivery Method: Simple face mask Preoxygenation: Pre-oxygenation with 100% oxygen Induction Type: IV induction Placement Confirmation: positive ETCO2 Dental Injury: Teeth and Oropharynx as per pre-operative assessment        

## 2020-11-05 NOTE — Transfer of Care (Signed)
Immediate Anesthesia Transfer of Care Note  Patient: Kimberly [name]  Procedure(s) Performed: RIGHT TOTAL KNEE ARTHROPLASTY (Right Knee)  Patient Location: PACU  Anesthesia Type:Spinal and MAC combined with regional for post-op pain  Level of Consciousness: awake, drowsy and patient cooperative  Airway & Oxygen Therapy: Patient Spontanous Breathing and Patient connected to face mask oxygen  Post-op Assessment: Report given to RN and Post -op Vital signs reviewed and stable  Post vital signs: Reviewed and stable  Last Vitals:  Vitals Value Taken Time  BP 124/71 11/05/20 1449  Temp    Pulse 76 11/05/20 1452  Resp 20 11/05/20 1452  SpO2 100 % 11/05/20 1452  Vitals shown include unvalidated device data.  Last Pain:  Vitals:   11/05/20 1038  TempSrc:   PainSc: 7       Patients Stated Pain Goal: 4 (41/66/06 3016)  Complications: No complications documented.

## 2020-11-05 NOTE — H&P (Addendum)
TOTAL KNEE ADMISSION H&P  Patient is being admitted for right total knee arthroplasty.  Subjective:  Chief Complaint:right knee pain.  64 year old white female history of end-stage DJD right knee and  chronic pain comes in for preop evaluation.  States that knee symptoms unchanged from previous visit.  She is want to proceed with right total knee replacement as scheduled.  We have received preop cardiac and medical clearances.  Today history and physical performed.  Patient does have history of sleep apnea but does not use her CPAP.  She was also previously on home O2 but states that she she stopped using this because she got better after her bariatric surgery last year.    HPI: Kimberly [name], 64 y.o. female, has a history of pain and functional disability in the right knee due to arthritis and has failed non-surgical conservative treatments for greater than 12 weeks to includeNSAID's and/or analgesics, supervised PT with diminished ADL's post treatment, use of assistive devices and activity modification.  Onset of symptoms was gradual, starting 10 years ago with gradually worsening course since that time.   Patient currently rates pain in the right knee(s) at 10 out of 10 with activity. Patient has night pain, worsening of pain with activity and weight bearing, pain that interferes with activities of daily living, pain with passive range of motion, crepitus and joint swelling.  Patient has evidence of subchondral sclerosis, periarticular osteophytes and joint space narrowing by imaging studies.  There is no active infection.  Patient Active Problem List   Diagnosis Date Noted  . Morbid obesity ([place]) 02/08/2018  . Guaiac positive stools 04/21/2017  . Pancytopenia ([place]) 04/09/2017  . Other cirrhosis of liver ([place]) 04/09/2017  . Acute pancreatitis 04/06/2017  . DM type 2 causing vascular disease ([place]) 04/06/2017  . HTN (hypertension) 04/06/2017  . OSA (obstructive sleep apnea) 04/06/2017  .  Asthma in adult 04/06/2017  . Obesity (BMI 30-39.9) 04/06/2017  . Primary osteoarthritis of both knees 10/26/2016  . Primary osteoarthritis of both hands 10/26/2016  . Primary osteoarthritis of both feet 10/26/2016  . COPD (chronic obstructive pulmonary disease) ([place]) 09/27/2016  . Hypothyroidism 09/27/2016  . Vertebral compression fracture ([place]) 09/27/2016  . Fatty liver 09/27/2016  . Fibromyalgia 09/27/2016  . Restless leg syndrome 09/27/2016  . Lumbar radiculitis 10/01/2014  . Nerve pain 08/30/2014  . Lumbar stenosis with neurogenic claudication 08/28/2014  . Spondylosis of lumbar region without myelopathy or radiculopathy 08/23/2014  . Lumbosacral stenosis with neurogenic claudication ([place]) 08/23/2014  . Vaginal discharge 06/22/2014  . Chest pain 06/21/2014  . Spinal stenosis 08/23/2011  . Obstructive sleep apnea 08/23/2011  . Diabetes ([place]) 08/12/2010  . CAD 08/12/2010  . Chronic diastolic heart failure ([place]) 08/12/2010  . Mixed hyperlipidemia 06/23/2009  . OVERWEIGHT/OBESITY 06/23/2009  . Essential hypertension, benign 06/23/2009  . CHEST PAIN-UNSPECIFIED 06/23/2009   Past Medical History:  Diagnosis Date  . Anemia    PMH: as a child  . Arthritis   . Asthma   . CAD (coronary artery disease)    nonobstructive by cath, 6/08 (false positive Cardiolite) normal stress echo, 7/11  . CHF (congestive heart failure) ([place])   . Chronic back pain   . Chronic bronchitis ([place])   . Cirrhosis ([place])   . Complication of anesthesia    had an asthma attack when woke up from procedure  . Complication of anesthesia    asthma attack with one surgery  . COPD (chronic obstructive pulmonary disease) ([place])   . Degenerative  joint disease   . Depression   . Diabetes mellitus without complication ([place])   . DJD (degenerative joint disease)   . Fatty liver   . Fibromyalgia   . GERD (gastroesophageal reflux disease)   . Glaucoma   . Headache(784.0)   . Heart failure, diastolic, chronic  ([place])   . Heart murmur    PMH:As a child only  . Hernia of abdominal cavity    "upper and lower hernia"  . History of hiatal hernia   . HTN (hypertension)   . Hypertension   . Hypothyroidism   . IBS (irritable bowel syndrome)   . IDDM (insulin dependent diabetes mellitus)   . Morbid obesity ([place])   . Neuropathy    associated with diabetes  . Obstructive sleep apnea   . Pancreatitis   . Pinched nerve in neck   . Pneumonia    as a child  . Pulmonary hypertension ([place])   . Rheumatic fever    PMH: as a child  . Seizures ([employer])    PMH: only as a child  . Seizures ([place])    in childhood  . Shortness of breath   . Sleep apnea   . Spinal stenosis   . Stroke [facility])    12/09/2013    Past Surgical History:  Procedure Laterality Date  . ABDOMINAL HYSTERECTOMY    . ABDOMINAL HYSTERECTOMY     partial hysterectomy  . APPENDECTOMY    . BACK SURGERY    . BACK SURGERY     disc surgery with complications and damage to right side  . BILATERAL KNEE ARTHROSCOPY  2000, 2009  . CARDIAC CATHETERIZATION     2009  . CATARACT EXTRACTION W/ INTRAOCULAR LENS  IMPLANT, BILATERAL    . CHOLECYSTECTOMY  1994  . CHOLECYSTECTOMY    . COLONOSCOPY N/A 08/01/2013   Procedure: COLONOSCOPY;  Surgeon: Rogene Houston, MD;  [facility];  Service: Endoscopy;  Laterality: N/A;  930  . COLONOSCOPY N/A 08/03/2017   Procedure: COLONOSCOPY;  Surgeon: Rogene Houston, MD;  Location: [facility];  Service: Endoscopy;  Laterality: N/A;  12:00  . CYST EXCISION     Left breast  . EXCISIONAL HEMORRHOIDECTOMY    . [place]  . EYE SURGERY     bilateral cataract removal with lens implants  . EYE SURGERY     growth removed from right eye lid  . EYE SURGERY     bilateral eye surgery age 77 t"to straighten eyes'  . heel (other)  2005  . HEEL SPUR SURGERY     Right heel - growth removal and rebuilding of heel  . HEMORRHOID SURGERY    . HERNIA REPAIR    . KNEE ARTHROSCOPY Bilateral   . LUMBAR  LAMINECTOMY/DECOMPRESSION MICRODISCECTOMY Right 08/23/2014   Procedure: LUMBAR LAMINECTOMY/DECOMPRESSION MICRODISCECTOMY 1 LEVEL;  Surgeon: Charlie Pitter, MD;  Location: [facility];  Service: Neurosurgery;  Laterality: Right;  LUMBAR LAMINECTOMY/DECOMPRESSION MICRODISCECTOMY 1 LEVEL LUMBAR 2-3  . ovaries removed  2003  . PARTIAL HYSTERECTOMY  1981  . SPHINCTEROTOMY    . spinahatomy  1992  . TUBAL LIGATION  1975  . TUBAL LIGATION      Current Facility-Administered Medications  Medication Dose Route Frequency Provider Last Rate Last Admin  . bupivacaine liposome (EXPAREL) 1.3 % injection 266 mg  20 mL Infiltration Once Lanae Crumbly, PA-C      . ceFAZolin (ANCEF) IVPB 2g/100 mL premix  2 g Intravenous  On Call to [place], [place], PA-C      . fentaNYL (SUBLIMAZE) injection 50-100 mcg  50-100 mcg Intravenous UD Nunzio Cobbs M, DO      . lactated ringers infusion   Intravenous Continuous Annye Asa, MD 10 mL/hr at 11/05/20 1101 New Bag at 11/05/20 1101  . midazolam (VERSED) injection 1-2 mg  1-2 mg Intravenous UD Pervis Hocking, DO       Allergies  Allergen Reactions  . Cinnamon Anaphylaxis  . Hydrocodone Other (See Comments)    Over sedation  . Naproxen Anaphylaxis and Other (See Comments)    Throat swelling  . Other Shortness Of Breath and Other (See Comments)    Local anesthesia Had asthma attack  . Other Shortness Of Breath and Other (See Comments)    Pt states a sedative agent used prior to a surgical procedure ( Pt states it "sounds like Phenergan. It should be in my records from [place].") caused an asthma attack.   . Oxycodone Other (See Comments)    Over sedation  . Feldene [Piroxicam] Nausea Only  . Lyrica [Pregabalin] Other (See Comments)    Altered mental status for prolonged period  . Nsaids     Avoid due to history of gastic bypass  . Phenergan [Promethazine Hcl] Other (See Comments)    triggers asthma attacks  . Sulfa Antibiotics Other (See  Comments)    (Yeast infection, per patient.)  . Latex Rash and Other (See Comments)    "Blistery" rash.   . Tape Rash and Other (See Comments)    Do not use adhesive tape; okay to use paper tape.    Social History   Tobacco Use  . Smoking status: Never Smoker  . Smokeless tobacco: Never Used  Substance Use Topics  . Alcohol use: No    Family History  Problem Relation Age of Onset  . Cancer Other   . Heart failure Other   . Colon cancer Brother      Review of Systems  Constitutional: Positive for activity change.  HENT: Negative.   Respiratory: Negative.   Cardiovascular: Negative.   Gastrointestinal: Negative.   Genitourinary: Negative.   Musculoskeletal: Positive for gait problem and joint swelling.  Psychiatric/Behavioral: Negative.     Objective:  Physical Exam Constitutional:      Appearance: Normal appearance.  HENT:     Head: Normocephalic.  Eyes:     Extraocular Movements: Extraocular movements intact.     Pupils: Pupils are equal, round, and reactive to light.  Cardiovascular:     Rate and Rhythm: Regular rhythm.  Pulmonary:     Effort: Pulmonary effort is normal. No respiratory distress.     Breath sounds: Normal breath sounds.  Abdominal:     General: There is no distension.  Musculoskeletal:        General: Tenderness present.  Skin:    General: Skin is warm and dry.  Neurological:     General: No focal deficit present.     Mental Status: She is alert and oriented to person, place, and time.  Psychiatric:        Mood and Affect: Mood normal.     Vital signs in last 24 hours: Temp:  [97.5 F (36.4 C)] 97.5 F (36.4 C) (12/15 1023) Pulse Rate:  [54] 54 (12/15 1023) Resp:  [15] 15 (12/15 1023) BP: (134)/(71) 134/71 (12/15 1023) SpO2:  [100 %] 100 % (12/15 1023) Weight:  [61.3 kg] 61.3 kg (12/15 1038)  Labs:  Estimated body mass index is 26.4 kg/m as calculated from the following:   Height as of this encounter: 5' (1.524 m).    Weight as of this encounter: 61.3 kg.   Imaging Review Plain radiographs demonstrate moderate degenerative joint disease of the right knee(s). The overall alignment ismild varus. The bone quality appears to be good for age and reported activity level.      Assessment/Plan:  End stage arthritis, right knee   The patient history, physical examination, clinical judgment of the provider and imaging studies are consistent with end stage degenerative joint disease of the right knee(s) and total knee arthroplasty is deemed medically necessary. The treatment options including medical management, injection therapy arthroscopy and arthroplasty were discussed at length. The risks and benefits of total knee arthroplasty were presented and reviewed. The risks due to aseptic loosening, infection, stiffness, patella tracking problems, thromboembolic complications and other imponderables were discussed. The patient acknowledged the explanation, agreed to proceed with the plan and consent was signed. Patient is being admitted for inpatient treatment for surgery, pain control, PT, OT, prophylactic antibiotics, VTE prophylaxis, progressive ambulation and ADL's and discharge planning. The patient is planning to be discharged home with home health services    Anticipated LOS equal to or greater than 2 midnights due to - Age 94 and older with one or more of the following:  - Obesity  - Expected need for hospital services (PT, OT, Nursing) required for safe  discharge  - Anticipated need for postoperative skilled nursing care or inpatient rehab  - Active co-morbidities: Diabetes and Heart Failure OR   - Unanticipated findings during/Post Surgery: None  - Patient is a high risk of re-admission due to: None

## 2020-11-05 NOTE — Interval H&P Note (Signed)
History and Physical Interval Note:  11/05/2020 12:42 PM  Kimberly [name]  has presented today for surgery, with the diagnosis of RIGHT KNEE OSTEOARTHRITIS.  The various methods of treatment have been discussed with the patient and family. After consideration of risks, benefits and other options for treatment, the patient has consented to  Procedure(s): RIGHT TOTAL KNEE ARTHROPLASTY (Right) as a surgical intervention.  The patient's history has been reviewed, patient examined, no change in status, stable for surgery.  I have reviewed the patient's chart and labs.  Questions were answered to the patient's satisfaction.     Marybelle Killings

## 2020-11-05 NOTE — Progress Notes (Signed)
Orthopedic Tech Progress Note Patient Details:  Kimberly [name] 20-May-1956 [id number]  CPM Right Knee CPM Right Knee: On Right Knee Flexion (Degrees): 90 Right Knee Extension (Degrees): 0  Post Interventions Patient Tolerated: Well Instructions Provided: Care of device  Saul Fordyce 11/05/2020, 3:26 PM

## 2020-11-05 NOTE — Evaluation (Signed)
Physical Therapy Evaluation Patient Details Name: Kimberly [name] MRN: [id number] DOB: Dec 25, 1955 Today's Date: 11/05/2020   History of Present Illness  Pt s/p R TKR and with hx of CVA (15), DM with neuropathy, DJD, Fibromyalgia, COPD, CHF, CAD, multiple back surgery with residual deficits on R LE  Clinical Impression   Pt s/p R TKR and presents with decreased R LE strength/ROM, post op pain, and increased dizziness with OOB activity limiting functional mobility.  Pt hopes to progress to dc home with HHPT follow up but at this time states she does not have 24/7 assist at home and "I may need to go to a nursing home".  Follow Up Recommendations Follow surgeon's recommendation for DC plan and follow-up therapies;Home health PT    Equipment Recommendations  Rolling walker with 5" wheels;3in1 (PT) (youth level RW)    Recommendations for Other Services       Precautions / Restrictions Precautions Precautions: Fall;Knee Required Braces or Orthoses: Knee Immobilizer - Right Knee Immobilizer - Right: On at all times Restrictions Weight Bearing Restrictions: No Other Position/Activity Restrictions: WBAT      Mobility  Bed Mobility Overal bed mobility: Needs Assistance Bed Mobility: Supine to Sit     Supine to sit: Min assist     General bed mobility comments: cues for sequence and use of L LE to self assist    Transfers Overall transfer level: Needs assistance Equipment used: Rolling walker (2 wheeled) Transfers: Sit to/from [facility] Sit to Stand: Min assist;Mod assist;+2 safety/equipment Stand pivot transfers: Min assist;Mod assist       General transfer comment: cues for LE management and use of UEs to self assist  Ambulation/Gait Ambulation/Gait assistance: Min assist;Mod assist;+2 physical assistance;+2 safety/equipment Gait Distance (Feet): 7 Feet Assistive device: Rolling walker (2 wheeled) Gait Pattern/deviations: Step-to pattern;Decreased  step length - right;Decreased step length - left;Shuffle;Trunk flexed Gait velocity: decr   General Gait Details: cues for sequence, posture and position from RW.  Distance ltd by increased dizziness  Stairs            Wheelchair Mobility    Modified Rankin (Stroke Patients Only)       Balance Overall balance assessment: Needs assistance Sitting-balance support: Feet supported;No upper extremity supported Sitting balance-Leahy Scale: Good     Standing balance support: Bilateral upper extremity supported Standing balance-Leahy Scale: Poor                               Pertinent Vitals/Pain Pain Assessment: 0-10 Pain Score: 4  Pain Location: R knee Pain Descriptors / Indicators: Aching;Sore Pain Intervention(s): Limited activity within patient's tolerance;Monitored during session;Premedicated before session;Ice applied    Home Living Family/patient expects to be discharged to:: Unsure Living Arrangements: Spouse/significant other Available Help at Discharge: Family;Available PRN/intermittently Type of Home: House Home Access: Stairs to enter Entrance Stairs-Rails: None Entrance Stairs-Number of Steps: 2 Home Layout: Multi-level Home Equipment: None      Prior Function Level of Independence: Independent               Hand Dominance   Dominant Hand: Left    Extremity/Trunk Assessment   Upper Extremity Assessment Upper Extremity Assessment: Overall WFL for tasks assessed    Lower Extremity Assessment Lower Extremity Assessment: RLE deficits/detail       Communication   Communication: No difficulties  Cognition Arousal/Alertness: Awake/alert Behavior During Therapy: WFL for tasks assessed/performed Overall Cognitive Status: Within Functional  Limits for tasks assessed                                        General Comments      Exercises Total Joint Exercises Ankle Circles/Pumps: AROM;Both;15 reps;Supine    Assessment/Plan    PT Assessment Patient needs continued PT services  PT Problem List Decreased strength;Decreased range of motion;Decreased activity tolerance;Decreased balance;Decreased mobility;Decreased knowledge of use of DME;Pain       PT Treatment Interventions DME instruction;Gait training;Stair training;Functional mobility training;Therapeutic activities;Therapeutic exercise;Patient/family education    PT Goals (Current goals can be found in the Care Plan section)  Acute Rehab PT Goals Patient Stated Goal: Regain IND PT Goal Formulation: With patient Time For Goal Achievement: 11/12/20 Potential to Achieve Goals: Fair    Frequency 7X/week   Barriers to discharge Decreased caregiver support Pt states she does not have assist arranged at home    Co-evaluation               AM-PAC PT "6 Clicks" Mobility  Outcome Measure Help needed turning from your back to your side while in a flat bed without using bedrails?: A Little Help needed moving from lying on your back to sitting on the side of a flat bed without using bedrails?: A Little Help needed moving to and from a bed to a chair (including a wheelchair)?: A Lot Help needed standing up from a chair using your arms (e.g., wheelchair or bedside chair)?: A Lot Help needed to walk in hospital room?: A Lot Help needed climbing 3-5 steps with a railing? : A Lot 6 Click Score: 14    End of Session Equipment Utilized During Treatment: Gait belt;Right knee immobilizer Activity Tolerance: Patient tolerated treatment well;Patient limited by fatigue Patient left: in chair;with call bell/phone within reach;with family/visitor present;with chair alarm set Nurse Communication: Mobility status PT Visit Diagnosis: Difficulty in walking, not elsewhere classified (R26.2);Pain Pain - Right/Left: Right Pain - part of body: Knee    Time: 7092-9574 PT Time Calculation (min) (ACUTE ONLY): 28 min   Charges:   PT Evaluation $PT  Eval Low Complexity: 1 Low PT Treatments $Gait Training: 8-22 mins        Debe Coder PT Acute Rehabilitation Services Pager [phone] Office [phone]   Kinta Martis 11/05/2020, 7:02 PM

## 2020-11-05 NOTE — Anesthesia Postprocedure Evaluation (Signed)
Anesthesia Post Note  Patient: Kimberly [name]  Procedure(s) Performed: RIGHT TOTAL KNEE ARTHROPLASTY (Right Knee)     Patient location during evaluation: PACU Anesthesia Type: Regional, MAC and Spinal Level of consciousness: awake and alert Pain management: pain level controlled Vital Signs Assessment: post-procedure vital signs reviewed and stable Respiratory status: spontaneous breathing, nonlabored ventilation and respiratory function stable Cardiovascular status: blood pressure returned to baseline and stable Postop Assessment: no apparent nausea or vomiting, spinal receding, no headache, no backache and patient able to bend at knees Anesthetic complications: no   No complications documented.  Last Vitals:  Vitals:   11/05/20 1530 11/05/20 1545  BP: 120/62 124/62  Pulse: 62 63  Resp: (!) 23 14  Temp:    SpO2: 100% 100%    Last Pain:  Vitals:   11/05/20 1545  TempSrc:   PainSc: 0-No pain                 Pervis Hocking

## 2020-11-06 ENCOUNTER — Encounter (HOSPITAL_COMMUNITY): Payer: Self-pay | Admitting: Orthopaedic Surgery

## 2020-11-06 DIAGNOSIS — Z79899 Other long term (current) drug therapy: Secondary | ICD-10-CM | POA: Diagnosis not present

## 2020-11-06 DIAGNOSIS — I2510 Atherosclerotic heart disease of native coronary artery without angina pectoris: Secondary | ICD-10-CM | POA: Diagnosis not present

## 2020-11-06 DIAGNOSIS — E119 Type 2 diabetes mellitus without complications: Secondary | ICD-10-CM | POA: Diagnosis not present

## 2020-11-06 DIAGNOSIS — I110 Hypertensive heart disease with heart failure: Secondary | ICD-10-CM | POA: Diagnosis not present

## 2020-11-06 DIAGNOSIS — J45909 Unspecified asthma, uncomplicated: Secondary | ICD-10-CM | POA: Diagnosis not present

## 2020-11-06 DIAGNOSIS — I5032 Chronic diastolic (congestive) heart failure: Secondary | ICD-10-CM | POA: Diagnosis not present

## 2020-11-06 DIAGNOSIS — J449 Chronic obstructive pulmonary disease, unspecified: Secondary | ICD-10-CM | POA: Diagnosis not present

## 2020-11-06 DIAGNOSIS — Z91040 Latex allergy status: Secondary | ICD-10-CM | POA: Diagnosis not present

## 2020-11-06 DIAGNOSIS — M1711 Unilateral primary osteoarthritis, right knee: Secondary | ICD-10-CM | POA: Diagnosis not present

## 2020-11-06 DIAGNOSIS — E039 Hypothyroidism, unspecified: Secondary | ICD-10-CM | POA: Diagnosis not present

## 2020-11-06 LAB — CBC
HCT: 27.5 % — ABNORMAL LOW (ref 36.0–46.0)
Hemoglobin: 9.1 g/dL — ABNORMAL LOW (ref 12.0–15.0)
MCH: 34.2 pg — ABNORMAL HIGH (ref 26.0–34.0)
MCHC: 33.1 g/dL (ref 30.0–36.0)
MCV: 103.4 fL — ABNORMAL HIGH (ref 80.0–100.0)
Platelets: 85 10*3/uL — ABNORMAL LOW (ref 150–400)
RBC: 2.66 MIL/uL — ABNORMAL LOW (ref 3.87–5.11)
RDW: 13.4 % (ref 11.5–15.5)
WBC: 6.3 10*3/uL (ref 4.0–10.5)
nRBC: 0 % (ref 0.0–0.2)

## 2020-11-06 LAB — BASIC METABOLIC PANEL
Anion gap: 7 (ref 5–15)
BUN: 21 mg/dL (ref 8–23)
CO2: 19 mmol/L — ABNORMAL LOW (ref 22–32)
Calcium: 8.2 mg/dL — ABNORMAL LOW (ref 8.9–10.3)
Chloride: 109 mmol/L (ref 98–111)
Creatinine, Ser: 1.02 mg/dL — ABNORMAL HIGH (ref 0.44–1.00)
GFR, Estimated: >60 mL/min (ref >60)
Glucose, Bld: 217 mg/dL — ABNORMAL HIGH (ref 70–99)
Potassium: 3.9 mmol/L (ref 3.5–5.1)
Sodium: 135 mmol/L (ref 135–145)

## 2020-11-06 LAB — GLUCOSE, CAPILLARY
Glucose-Capillary: 107 mg/dL — ABNORMAL HIGH (ref 70–99)
Glucose-Capillary: 165 mg/dL — ABNORMAL HIGH (ref 70–99)
Glucose-Capillary: 97 mg/dL (ref 70–99)
Glucose-Capillary: 110 mg/dL — ABNORMAL HIGH (ref 70–99)

## 2020-11-06 MED ORDER — PANTOPRAZOLE SODIUM 40 MG PO TBEC
40.0000 mg | DELAYED_RELEASE_TABLET | Freq: Every day | ORAL | Status: DC
  Administered 2020-11-06 – 2020-11-07 (×2): 40 mg via ORAL
  Filled 2020-11-06 (×2): qty 1

## 2020-11-06 NOTE — NC FL2 (Signed)
[place] LEVEL OF CARE SCREENING TOOL     IDENTIFICATION  Patient Name: Kimberly [name] Birthdate: [birth year] Sex: female Admission Date (Current Location): 11/05/2020  [facility] and [place] Number:  Herbalist and Address:  [facility],  [address], [place]      Provider Number: [id number]  Attending Physician Name and Address:  Marybelle Killings, MD  Relative Name and Phone Number:  spouse, Taiwana Willison @ [phone]    Current Level of Care: Hospital Recommended Level of Care: [place] Prior Approval Number:    Date Approved/Denied:   PASRR Number: [id number]  Discharge Plan: SNF    Current Diagnoses: Patient Active Problem List   Diagnosis Date Noted  . Arthritis of right knee 11/05/2020  . Morbid obesity ([place]) 02/08/2018  . Guaiac positive stools 04/21/2017  . Pancytopenia ([place]) 04/09/2017  . Other cirrhosis of liver ([place]) 04/09/2017  . Acute pancreatitis 04/06/2017  . DM type 2 causing vascular disease ([place]) 04/06/2017  . HTN (hypertension) 04/06/2017  . OSA (obstructive sleep apnea) 04/06/2017  . Asthma in adult 04/06/2017  . Obesity (BMI 30-39.9) 04/06/2017  . Primary osteoarthritis of both knees 10/26/2016  . Primary osteoarthritis of both hands 10/26/2016  . Primary osteoarthritis of both feet 10/26/2016  . COPD (chronic obstructive pulmonary disease) ([place]) 09/27/2016  . Hypothyroidism 09/27/2016  . Vertebral compression fracture ([place]) 09/27/2016  . Fatty liver 09/27/2016  . Fibromyalgia 09/27/2016  . Restless leg syndrome 09/27/2016  . Lumbar radiculitis 10/01/2014  . Nerve pain 08/30/2014  . Lumbar stenosis with neurogenic claudication 08/28/2014  . Spondylosis of lumbar region without myelopathy or radiculopathy 08/23/2014  . Lumbosacral stenosis with neurogenic claudication ([place]) 08/23/2014  . Vaginal discharge 06/22/2014  . Chest pain 06/21/2014  . Spinal stenosis 08/23/2011   . Obstructive sleep apnea 08/23/2011  . Diabetes ([place]) 08/12/2010  . CAD 08/12/2010  . Chronic diastolic heart failure ([place]) 08/12/2010  . Mixed hyperlipidemia 06/23/2009  . OVERWEIGHT/OBESITY 06/23/2009  . Essential hypertension, benign 06/23/2009  . CHEST PAIN-UNSPECIFIED 06/23/2009    Orientation RESPIRATION BLADDER Height & Weight     Self,Time,Situation,Place  Normal Continent Weight: 135 lb 3.2 oz (61.3 kg) Height:  5' (152.4 cm)  BEHAVIORAL SYMPTOMS/MOOD NEUROLOGICAL BOWEL NUTRITION STATUS      Continent    AMBULATORY STATUS COMMUNICATION OF NEEDS Skin   Limited Assist Verbally Surgical wounds                       Personal Care Assistance Level of Assistance  Bathing Bathing Assistance: Limited assistance         Functional Limitations Info             SPECIAL CARE FACTORS FREQUENCY  PT (By licensed PT),OT (By licensed OT)     PT Frequency: 5x/wk OT Frequency: 5x/wk            Contractures Contractures Info: Not present    Additional Factors Info  Code Status,Allergies Code Status Info: full Allergies Info: see MAR           Current Medications (11/06/2020):  This is the current hospital active medication list Current Facility-Administered Medications  Medication Dose Route Frequency Provider Last Rate Last Admin  . 0.9 %  sodium chloride infusion   Intravenous Continuous Lanae Crumbly, PA-C   Stopped at 11/06/20 0730  . acetaminophen (TYLENOL) tablet 325-650 mg  325-650 mg Oral Q6H PRN Lanae Crumbly, PA-C      .  albuterol (PROVENTIL) (2.5 MG/3ML) 0.083% nebulizer solution 2.5 mg  2.5 mg Inhalation Q6H PRN Lanae Crumbly, PA-C      . atorvastatin (LIPITOR) tablet 10 mg  10 mg Oral q1800 Benjiman Core M, PA-C      . diphenhydrAMINE (BENADRYL) capsule 25 mg  25 mg Oral Q6H PRN Mcarthur Rossetti, MD   25 mg at 11/06/20 0040  . docusate sodium (COLACE) capsule 100 mg  100 mg Oral BID Lanae Crumbly, PA-C   100 mg at 11/06/20 3825  .  enoxaparin (LOVENOX) injection 30 mg  30 mg Subcutaneous Q12H Lanae Crumbly, PA-C   30 mg at 11/06/20 0944  . famotidine (PEPCID) tablet 40 mg  40 mg Oral q morning - 10a Lanae Crumbly, PA-C   40 mg at 11/06/20 0539  . ferrous sulfate tablet 325 mg  325 mg Oral Q lunch Benjiman Core M, PA-C      . fluticasone furoate-vilanterol (BREO ELLIPTA) 200-25 MCG/INH 1 puff  1 puff Inhalation Daily Marybelle Killings, MD   1 puff at 11/06/20 0846  . levothyroxine (SYNTHROID) tablet 112 mcg  112 mcg Oral QAC breakfast Lanae Crumbly, PA-C   112 mcg at 11/06/20 0636  . menthol-cetylpyridinium (CEPACOL) lozenge 3 mg  1 lozenge Oral PRN Lanae Crumbly, PA-C       Or  . phenol (CHLORASEPTIC) mouth spray 1 spray  1 spray Mouth/Throat PRN Lanae Crumbly, PA-C      . methocarbamol (ROBAXIN) tablet 500 mg  500 mg Oral Q6H PRN Lanae Crumbly, PA-C   500 mg at 11/06/20 7673   Or  . methocarbamol (ROBAXIN) 500 mg in dextrose 5 % 50 mL IVPB  500 mg Intravenous Q6H PRN Lanae Crumbly, PA-C      . metoCLOPramide (REGLAN) tablet 5-10 mg  5-10 mg Oral Q8H PRN Lanae Crumbly, PA-C       Or  . metoCLOPramide (REGLAN) injection 5-10 mg  5-10 mg Intravenous Q8H PRN Benjiman Core M, PA-C      . ondansetron [facility]) tablet 4 mg  4 mg Oral Q6H PRN Lanae Crumbly, PA-C       Or  . ondansetron [facility]) injection 4 mg  4 mg Intravenous Q6H PRN Lanae Crumbly, PA-C   4 mg at 11/05/20 2000  . oxyCODONE (Oxy IR/ROXICODONE) immediate release tablet 5 mg  5 mg Oral Q6H PRN Lanae Crumbly, PA-C   5 mg at 11/06/20 4193  . pantoprazole (PROTONIX) EC tablet 40 mg  40 mg Oral Daily Mcarthur Rossetti, MD   40 mg at 11/06/20 7902  . polyethylene glycol (MIRALAX / GLYCOLAX) packet 17 g  17 g Oral Daily PRN Lanae Crumbly, PA-C      . spironolactone (ALDACTONE) tablet 25 mg  25 mg Oral Daily Lanae Crumbly, PA-C   25 mg at 11/06/20 0943  . vitamin B-12 (CYANOCOBALAMIN) tablet 5,000 mcg  5,000 mcg Oral Paulino Rily, PA-C   5,000 mcg at  11/06/20 4097  . zinc sulfate capsule 220 mg  220 mg Oral Daily Lanae Crumbly, PA-C   220 mg at 11/06/20 3532     Discharge Medications: Please see discharge summary for a list of discharge medications.  Relevant Imaging Results:  Relevant Lab Results:   Additional Information SS# [id number]  Lennart Pall, LCSW

## 2020-11-06 NOTE — Progress Notes (Signed)
Physical Therapy Treatment Patient Details Name: Kimberly [name] MRN: [id number] DOB: 1956/02/14 Today's Date: 11/06/2020    History of Present Illness Pt s/p R TKR and with hx of CVA (15), DM with neuropathy, DJD, Fibromyalgia, COPD, CHF, CAD, multiple back surgery with residual deficits on R LE    PT Comments    Pt very cooperative and progressing steadily with mobility.  This am, pt up to ambulate increased distance with no c/o dizziness.   Follow Up Recommendations  SNF;Follow surgeon's recommendation for DC plan and follow-up therapies     Equipment Recommendations  Rolling walker with 5" wheels;3in1 (PT)    Recommendations for Other Services       Precautions / Restrictions Precautions Precautions: Fall;Knee Required Braces or Orthoses: Knee Immobilizer - Right Knee Immobilizer - Right: On at all times Restrictions Weight Bearing Restrictions: No Other Position/Activity Restrictions: WBAT    Mobility  Bed Mobility               General bed mobility comments: up in chair and requests back to same  Transfers Overall transfer level: Needs assistance Equipment used: Rolling walker (2 wheeled) Transfers: Sit to/from Stand Sit to Stand: Min assist         General transfer comment: cues for LE management and use of UEs to self assist  Ambulation/Gait Ambulation/Gait assistance: Min assist Gait Distance (Feet): 48 Feet Assistive device: Rolling walker (2 wheeled) Gait Pattern/deviations: Step-to pattern;Decreased step length - right;Decreased step length - left;Shuffle;Trunk flexed Gait velocity: decr   General Gait Details: cues for sequence, posture and position from RW.   Stairs             Wheelchair Mobility    Modified Rankin (Stroke Patients Only)       Balance Overall balance assessment: Needs assistance Sitting-balance support: Feet supported;No upper extremity supported Sitting balance-Leahy Scale: Good     Standing  balance support: Bilateral upper extremity supported Standing balance-Leahy Scale: Poor                              Cognition Arousal/Alertness: Awake/alert Behavior During Therapy: WFL for tasks assessed/performed Overall Cognitive Status: Within Functional Limits for tasks assessed                                        Exercises      General Comments        Pertinent Vitals/Pain Pain Assessment: 0-10 Pain Score: 6  Pain Location: R knee Pain Descriptors / Indicators: Aching;Sore Pain Intervention(s): Limited activity within patient's tolerance;Monitored during session;Premedicated before session;Ice applied    Home Living                      Prior Function            PT Goals (current goals can now be found in the care plan section) Acute Rehab PT Goals Patient Stated Goal: Regain IND PT Goal Formulation: With patient Time For Goal Achievement: 11/12/20 Potential to Achieve Goals: Fair Progress towards PT goals: Progressing toward goals    Frequency    7X/week      PT Plan Current plan remains appropriate    Co-evaluation              AM-PAC PT "6 Clicks" Mobility   Outcome Measure  Help  needed turning from your back to your side while in a flat bed without using bedrails?: A Little Help needed moving from lying on your back to sitting on the side of a flat bed without using bedrails?: A Little Help needed moving to and from a bed to a chair (including a wheelchair)?: A Little Help needed standing up from a chair using your arms (e.g., wheelchair or bedside chair)?: A Little Help needed to walk in hospital room?: A Little Help needed climbing 3-5 steps with a railing? : A Lot 6 Click Score: 17    End of Session Equipment Utilized During Treatment: Gait belt;Right knee immobilizer Activity Tolerance: Patient tolerated treatment well;Patient limited by fatigue Patient left: in chair;with call bell/phone  within reach;with family/visitor present;with chair alarm set Nurse Communication: Mobility status PT Visit Diagnosis: Difficulty in walking, not elsewhere classified (R26.2);Pain Pain - Right/Left: Right Pain - part of body: Knee     Time: 1006-1020 PT Time Calculation (min) (ACUTE ONLY): 14 min  Charges:  $Gait Training: 8-22 mins                     [place] Pager [phone] Office [phone]    Sulamita Lafountain 11/06/2020, 3:34 PM

## 2020-11-06 NOTE — Progress Notes (Signed)
Physical Therapy Treatment Patient Details Name: Kimberly [name] MRN: [id number] DOB: [birth year] Today's Date: 11/06/2020    History of Present Illness Pt s/p R TKR and with hx of CVA (15), DM with neuropathy, DJD, Fibromyalgia, COPD, CHF, CAD, multiple back surgery with residual deficits on R LE    PT Comments    Pt continues motivated and progressing steadily with mobility.  This pm, pt up to ambulate increased distance in hall and up to bathroom for toileting and hand hygiene.     Follow Up Recommendations  SNF;Follow surgeon's recommendation for DC plan and follow-up therapies     Equipment Recommendations  Rolling walker with 5" wheels;3in1 (PT)    Recommendations for Other Services       Precautions / Restrictions Precautions Precautions: Fall;Knee Required Braces or Orthoses: Knee Immobilizer - Right Knee Immobilizer - Right: On at all times Restrictions Weight Bearing Restrictions: No Other Position/Activity Restrictions: WBAT    Mobility  Bed Mobility Overal bed mobility: Needs Assistance Bed Mobility: Supine to Sit;Sit to Supine     Supine to sit: Min assist Sit to supine: Min assist   General bed mobility comments: cues for sequence and use of L LE to self assist.  Transfers Overall transfer level: Needs assistance Equipment used: Rolling walker (2 wheeled) Transfers: Sit to/from Stand Sit to Stand: Min assist         General transfer comment: cues for LE management and use of UEs to self assist  Ambulation/Gait Ambulation/Gait assistance: Min assist Gait Distance (Feet): 75 Feet (and 20' twice to/from bathroom) Assistive device: Rolling walker (2 wheeled) Gait Pattern/deviations: Step-to pattern;Decreased step length - right;Decreased step length - left;Shuffle;Trunk flexed Gait velocity: decr   General Gait Details: cues for sequence, posture and position from RW.   Stairs             Wheelchair Mobility    Modified Rankin  (Stroke Patients Only)       Balance Overall balance assessment: Needs assistance Sitting-balance support: Feet supported;No upper extremity supported Sitting balance-Leahy Scale: Good     Standing balance support: Bilateral upper extremity supported Standing balance-Leahy Scale: Poor                              Cognition Arousal/Alertness: Awake/alert Behavior During Therapy: WFL for tasks assessed/performed Overall Cognitive Status: Within Functional Limits for tasks assessed                                        Exercises      General Comments        Pertinent Vitals/Pain Pain Assessment: 0-10 Pain Score: 6  Pain Location: R knee Pain Descriptors / Indicators: Aching;Sore Pain Intervention(s): Limited activity within patient's tolerance;Monitored during session;Premedicated before session;Ice applied    Home Living                      Prior Function            PT Goals (current goals can now be found in the care plan section) Acute Rehab PT Goals Patient Stated Goal: Regain IND PT Goal Formulation: With patient Time For Goal Achievement: 11/12/20 Potential to Achieve Goals: Fair Progress towards PT goals: Progressing toward goals    Frequency    7X/week      PT Plan Current  plan remains appropriate    Co-evaluation              AM-PAC PT "6 Clicks" Mobility   Outcome Measure  Help needed turning from your back to your side while in a flat bed without using bedrails?: A Little Help needed moving from lying on your back to sitting on the side of a flat bed without using bedrails?: A Little Help needed moving to and from a bed to a chair (including a wheelchair)?: A Little Help needed standing up from a chair using your arms (e.g., wheelchair or bedside chair)?: A Little Help needed to walk in hospital room?: A Little Help needed climbing 3-5 steps with a railing? : A Lot 6 Click Score: 17    End of  Session Equipment Utilized During Treatment: Gait belt;Right knee immobilizer Activity Tolerance: Patient tolerated treatment well;Patient limited by fatigue Patient left: in bed;with call bell/phone within reach;with family/visitor present Nurse Communication: Mobility status PT Visit Diagnosis: Difficulty in walking, not elsewhere classified (R26.2);Pain Pain - Right/Left: Right Pain - part of body: Knee     Time: 1401-1430 PT Time Calculation (min) (ACUTE ONLY): 29 min  Charges:  $Gait Training: 8-22 mins $Therapeutic Activity: 8-22 mins                     Debe Coder PT Acute Rehabilitation Services Pager [phone] Office [phone]    Melisha Eggleton 11/06/2020, 3:45 PM

## 2020-11-06 NOTE — Progress Notes (Signed)
Physical Therapy Treatment Patient Details Name: Kimberly [name] MRN: [id number] DOB: [birth year] Today's Date: 11/06/2020    History of Present Illness Pt s/p R TKR and with hx of CVA (15), DM with neuropathy, DJD, Fibromyalgia, COPD, CHF, CAD, multiple back surgery with residual deficits on R LE    PT Comments    Pt performed therex program with assist and then up to chair for bfast.  Will return to progress ambulation.  Pt requesting follow up rehab at SNF level stating she will be home alone at point of dc from hospital.  Follow Up Recommendations  SNF;Follow surgeon's recommendation for DC plan and follow-up therapies     Equipment Recommendations  Rolling walker with 5" wheels;3in1 (PT)    Recommendations for Other Services       Precautions / Restrictions Precautions Precautions: Fall;Knee Required Braces or Orthoses: Knee Immobilizer - Right Knee Immobilizer - Right: On at all times Restrictions Weight Bearing Restrictions: No Other Position/Activity Restrictions: WBAT    Mobility  Bed Mobility Overal bed mobility: Needs Assistance Bed Mobility: Supine to Sit     Supine to sit: Min assist     General bed mobility comments: cues for sequence and use of L LE to self assist  Transfers Overall transfer level: Needs assistance Equipment used: Rolling walker (2 wheeled) Transfers: Sit to/from Stand Sit to Stand: Min assist;Mod assist         General transfer comment: cues for LE management and use of UEs to self assist  Ambulation/Gait Ambulation/Gait assistance: Min assist Gait Distance (Feet): 7 Feet Assistive device: Rolling walker (2 wheeled)   Gait velocity: decr   General Gait Details: cues for sequence, posture and position from RW.   Stairs             Wheelchair Mobility    Modified Rankin (Stroke Patients Only)       Balance Overall balance assessment: Needs assistance Sitting-balance support: Feet supported;No upper  extremity supported Sitting balance-Leahy Scale: Good     Standing balance support: Bilateral upper extremity supported Standing balance-Leahy Scale: Poor                              Cognition Arousal/Alertness: Awake/alert Behavior During Therapy: WFL for tasks assessed/performed Overall Cognitive Status: Within Functional Limits for tasks assessed                                        Exercises Total Joint Exercises Ankle Circles/Pumps: AROM;Both;15 reps;Supine Quad Sets: AROM;Both;10 reps;Supine Heel Slides: AAROM;Supine;15 reps;Right Straight Leg Raises: AAROM;Right;10 reps;Supine Goniometric ROM: AAROM R knee -5 - 65    General Comments        Pertinent Vitals/Pain Pain Assessment: 0-10 Pain Score: 6  Pain Location: R knee Pain Descriptors / Indicators: Aching;Sore Pain Intervention(s): Limited activity within patient's tolerance;Monitored during session;Premedicated before session;Ice applied    Home Living                      Prior Function            PT Goals (current goals can now be found in the care plan section) Acute Rehab PT Goals Patient Stated Goal: Regain IND PT Goal Formulation: With patient Time For Goal Achievement: 11/12/20 Potential to Achieve Goals: Fair Progress towards PT goals: Progressing toward goals  Frequency    7X/week      PT Plan Discharge plan needs to be updated    Co-evaluation              AM-PAC PT "6 Clicks" Mobility   Outcome Measure  Help needed turning from your back to your side while in a flat bed without using bedrails?: A Little Help needed moving from lying on your back to sitting on the side of a flat bed without using bedrails?: A Little Help needed moving to and from a bed to a chair (including a wheelchair)?: A Little Help needed standing up from a chair using your arms (e.g., wheelchair or bedside chair)?: A Little Help needed to walk in hospital  room?: A Little Help needed climbing 3-5 steps with a railing? : A Lot 6 Click Score: 17    End of Session Equipment Utilized During Treatment: Gait belt;Right knee immobilizer Activity Tolerance: Patient tolerated treatment well;Patient limited by fatigue Patient left: in chair;with call bell/phone within reach;with family/visitor present;with chair alarm set Nurse Communication: Mobility status PT Visit Diagnosis: Difficulty in walking, not elsewhere classified (R26.2);Pain Pain - Right/Left: Right Pain - part of body: Knee     Time: 0722-5750 PT Time Calculation (min) (ACUTE ONLY): 21 min  Charges:  $Therapeutic Exercise: 8-22 mins                     Debe Coder PT Acute Rehabilitation Services Pager [phone] Office [phone]    Rilla Buckman 11/06/2020, 8:51 AM

## 2020-11-06 NOTE — TOC Initial Note (Signed)
Transition of Care [facility]) - Initial/Assessment Note    Patient Details  Name: Kimberly [name] MRN: [id number] Date of Birth: April 26, 1956  Transition of Care [facility]) CM/SW Contact:    Lennart Pall, LCSW Phone Number: 11/06/2020, 10:54 AM  Clinical Narrative:                 Met with patient this morning to review dc plans.  Per Ortho Bundle note, pt has been set up with HHPT, however, pt now very concerned about going directly home.  She notes that her spouse is working 12 hours shifts and she feels that she needs short term SNF initially.  PT is also recommending SNF.  Have reviewed SNF placement process with her and need for insurance auth and she wishes to proceed with this plan. Have alerted Jamse Arn, RN with MD office. Will begin auth and bed search.  Expected Discharge Plan: Skilled Nursing Facility Barriers to Discharge: Continued Medical Work up,Insurance Authorization   Patient Goals and CMS Choice Patient states their goals for this hospitalization and ongoing recovery are:: to eventually return home after rehab CMS Medicare.gov Compare Post Acute Care list provided to:: Patient Choice offered to / list presented to : Patient  Expected Discharge Plan and Services Expected Discharge Plan: [place] In-house Referral: Clinical Social Work   Post Acute Care Choice: [place] Living arrangements for the past 2 months: [place]                 DME Arranged: 3-N-1,Walker rolling DME Agency: [employer]       HH Arranged: PT [place] 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 (now [facility])        Prior Living Arrangements/Services Living arrangements for the past 2 months: [place] Lives with:: Spouse Patient language and need for interpreter reviewed:: Yes Do you feel safe going back to the place where you live?: No   Pt notes that her spouse works 12hr shifts and feels she needs short term SNF for rehab prior to dc home  Need for  Family Participation in Patient Care: Yes (Comment) Care giver support system in place?: No (comment)   Criminal Activity/Legal Involvement Pertinent to Current Situation/Hospitalization: No - Comment as needed  Activities of Daily Living Home Assistive Devices/Equipment: Eyeglasses,CBG Meter,Blood pressure cuff,Cane (specify quad or straight) ADL Screening (condition at time of admission) Patient's cognitive ability adequate to safely complete daily activities?: Yes Is the patient deaf or have difficulty hearing?: No Does the patient have difficulty seeing, even when wearing glasses/contacts?: Yes Does the patient have difficulty concentrating, remembering, or making decisions?: Yes Patient able to express need for assistance with ADLs?: Yes Does the patient have difficulty dressing or bathing?: Yes Independently performs ADLs?: Yes (appropriate for developmental age) Does the patient have difficulty walking or climbing stairs?: Yes Weakness of Legs: Right Weakness of Arms/Hands: None  Permission Sought/Granted Permission sought to share information with : Facility Sport and exercise psychologist                Emotional Assessment Appearance:: Appears stated age Attitude/Demeanor/Rapport: Engaged,Gracious Affect (typically observed): Accepting Orientation: : Oriented to Self,Oriented to Place,Oriented to  Time,Oriented to Situation Alcohol / Substance Use: Not Applicable Psych Involvement: No (comment)  Admission diagnosis:  Arthritis of right knee [M17.11] Patient Active Problem List   Diagnosis Date Noted  . Arthritis of right knee 11/05/2020  . Morbid obesity ([place]) 02/08/2018  . Guaiac positive stools 04/21/2017  . Pancytopenia ([place]) 04/09/2017  .  Other cirrhosis of liver ([place]) 04/09/2017  . Acute pancreatitis 04/06/2017  . DM type 2 causing vascular disease ([place]) 04/06/2017  . HTN (hypertension) 04/06/2017  . OSA (obstructive sleep apnea) 04/06/2017  . Asthma in adult  04/06/2017  . Obesity (BMI 30-39.9) 04/06/2017  . Primary osteoarthritis of both knees 10/26/2016  . Primary osteoarthritis of both hands 10/26/2016  . Primary osteoarthritis of both feet 10/26/2016  . COPD (chronic obstructive pulmonary disease) ([place]) 09/27/2016  . Hypothyroidism 09/27/2016  . Vertebral compression fracture ([place]) 09/27/2016  . Fatty liver 09/27/2016  . Fibromyalgia 09/27/2016  . Restless leg syndrome 09/27/2016  . Lumbar radiculitis 10/01/2014  . Nerve pain 08/30/2014  . Lumbar stenosis with neurogenic claudication 08/28/2014  . Spondylosis of lumbar region without myelopathy or radiculopathy 08/23/2014  . Lumbosacral stenosis with neurogenic claudication ([place]) 08/23/2014  . Vaginal discharge 06/22/2014  . Chest pain 06/21/2014  . Spinal stenosis 08/23/2011  . Obstructive sleep apnea 08/23/2011  . Diabetes ([place]) 08/12/2010  . CAD 08/12/2010  . Chronic diastolic heart failure ([place]) 08/12/2010  . Mixed hyperlipidemia 06/23/2009  . OVERWEIGHT/OBESITY 06/23/2009  . Essential hypertension, benign 06/23/2009  . CHEST PAIN-UNSPECIFIED 06/23/2009   PCP:  Glenda Chroman, MD Pharmacy:   [place], [place] [address] [place] [address] Phone: [phone] Fax: [phone]  [facility] [place], [address] [place] [address] Phone: [phone] Fax: [phone]     Social Determinants of Health (SDOH) Interventions    Readmission Risk Interventions No flowsheet data found.

## 2020-11-06 NOTE — Progress Notes (Signed)
Subjective: Patient seen this AM.  Pain controlled right knee. Patient spoke with husband and they feel it will be in her best interest to do rehab at [facility].  Patient will not have much assistance at home and husband has some personal physical limitations the time that he will be there.     Objective: Vital signs in last 24 hours: Temp:  [97.3 F (36.3 C)-99.2 F (37.3 C)] 98 F (36.7 C) (12/16 0923) Pulse Rate:  [50-75] 63 (12/16 0923) Resp:  [9-23] 13 (12/16 0923) BP: (104-140)/(53-74) 104/55 (12/16 0923) SpO2:  [97 %-100 %] 97 % (12/16 0923)  Intake/Output from previous day: 12/15 0701 - 12/16 0700 In: 2560 [P.O.:420; I.V.:2040; IV Piggyback:100] Out: 600 [Urine:600] Intake/Output this shift: Total I/O In: -  Out: 275 [Urine:275]  Recent Labs    11/05/20 1626 11/06/20 0313  HGB 10.9* 9.1*   Recent Labs    11/05/20 1626 11/06/20 0313  WBC 2.4* 6.3  RBC 3.12* 2.66*  HCT 31.8* 27.5*  PLT 90* 85*   Recent Labs    11/05/20 1626 11/06/20 0313  NA  --  135  K  --  3.9  CL  --  109  CO2  --  19*  BUN  --  21  CREATININE 0.97 1.02*  GLUCOSE  --  217*  CALCIUM  --  8.2*   No results for input(s): LABPT, INR in the last 72 hours.  Exam: Pleasant female alert and oriented, NAD.  Dressing clean, dry and intact.  Calf nontender.     Assessment/Plan: Arrange snf placement for rehab.  Continue present care. D/c dilaudid.      Benjiman Core 11/06/2020, 10:54 AM

## 2020-11-07 DIAGNOSIS — D649 Anemia, unspecified: Secondary | ICD-10-CM | POA: Diagnosis not present

## 2020-11-07 DIAGNOSIS — K589 Irritable bowel syndrome without diarrhea: Secondary | ICD-10-CM | POA: Diagnosis not present

## 2020-11-07 DIAGNOSIS — M25561 Pain in right knee: Secondary | ICD-10-CM | POA: Diagnosis not present

## 2020-11-07 DIAGNOSIS — Z471 Aftercare following joint replacement surgery: Secondary | ICD-10-CM | POA: Diagnosis not present

## 2020-11-07 DIAGNOSIS — E785 Hyperlipidemia, unspecified: Secondary | ICD-10-CM | POA: Diagnosis not present

## 2020-11-07 DIAGNOSIS — I5032 Chronic diastolic (congestive) heart failure: Secondary | ICD-10-CM | POA: Diagnosis not present

## 2020-11-07 DIAGNOSIS — K219 Gastro-esophageal reflux disease without esophagitis: Secondary | ICD-10-CM | POA: Diagnosis not present

## 2020-11-07 DIAGNOSIS — I509 Heart failure, unspecified: Secondary | ICD-10-CM | POA: Diagnosis not present

## 2020-11-07 DIAGNOSIS — Z91040 Latex allergy status: Secondary | ICD-10-CM | POA: Diagnosis not present

## 2020-11-07 DIAGNOSIS — R5381 Other malaise: Secondary | ICD-10-CM | POA: Diagnosis not present

## 2020-11-07 DIAGNOSIS — Z9884 Bariatric surgery status: Secondary | ICD-10-CM | POA: Diagnosis not present

## 2020-11-07 DIAGNOSIS — I110 Hypertensive heart disease with heart failure: Secondary | ICD-10-CM | POA: Diagnosis not present

## 2020-11-07 DIAGNOSIS — J42 Unspecified chronic bronchitis: Secondary | ICD-10-CM | POA: Diagnosis not present

## 2020-11-07 DIAGNOSIS — M6281 Muscle weakness (generalized): Secondary | ICD-10-CM | POA: Diagnosis not present

## 2020-11-07 DIAGNOSIS — I2510 Atherosclerotic heart disease of native coronary artery without angina pectoris: Secondary | ICD-10-CM | POA: Diagnosis not present

## 2020-11-07 DIAGNOSIS — E039 Hypothyroidism, unspecified: Secondary | ICD-10-CM | POA: Diagnosis not present

## 2020-11-07 DIAGNOSIS — Z79899 Other long term (current) drug therapy: Secondary | ICD-10-CM | POA: Diagnosis not present

## 2020-11-07 DIAGNOSIS — M1711 Unilateral primary osteoarthritis, right knee: Secondary | ICD-10-CM | POA: Diagnosis not present

## 2020-11-07 DIAGNOSIS — Z7401 Bed confinement status: Secondary | ICD-10-CM | POA: Diagnosis not present

## 2020-11-07 DIAGNOSIS — M2550 Pain in unspecified joint: Secondary | ICD-10-CM | POA: Diagnosis not present

## 2020-11-07 DIAGNOSIS — E119 Type 2 diabetes mellitus without complications: Secondary | ICD-10-CM | POA: Diagnosis not present

## 2020-11-07 DIAGNOSIS — I10 Essential (primary) hypertension: Secondary | ICD-10-CM | POA: Diagnosis not present

## 2020-11-07 DIAGNOSIS — R262 Difficulty in walking, not elsewhere classified: Secondary | ICD-10-CM | POA: Diagnosis not present

## 2020-11-07 DIAGNOSIS — J45909 Unspecified asthma, uncomplicated: Secondary | ICD-10-CM | POA: Diagnosis not present

## 2020-11-07 DIAGNOSIS — Z96651 Presence of right artificial knee joint: Secondary | ICD-10-CM | POA: Diagnosis not present

## 2020-11-07 DIAGNOSIS — J449 Chronic obstructive pulmonary disease, unspecified: Secondary | ICD-10-CM | POA: Diagnosis not present

## 2020-11-07 DIAGNOSIS — E1159 Type 2 diabetes mellitus with other circulatory complications: Secondary | ICD-10-CM | POA: Diagnosis not present

## 2020-11-07 LAB — GLUCOSE, CAPILLARY
Glucose-Capillary: 89 mg/dL (ref 70–99)
Glucose-Capillary: 109 mg/dL — ABNORMAL HIGH (ref 70–99)
Glucose-Capillary: 84 mg/dL (ref 70–99)

## 2020-11-07 LAB — CBC
HCT: 27.8 % — ABNORMAL LOW (ref 36.0–46.0)
Hemoglobin: 9.2 g/dL — ABNORMAL LOW (ref 12.0–15.0)
MCH: 35 pg — ABNORMAL HIGH (ref 26.0–34.0)
MCHC: 33.1 g/dL (ref 30.0–36.0)
MCV: 105.7 fL — ABNORMAL HIGH (ref 80.0–100.0)
Platelets: 87 10*3/uL — ABNORMAL LOW (ref 150–400)
RBC: 2.63 MIL/uL — ABNORMAL LOW (ref 3.87–5.11)
RDW: 13.8 % (ref 11.5–15.5)
WBC: 7.7 10*3/uL (ref 4.0–10.5)
nRBC: 0 % (ref 0.0–0.2)

## 2020-11-07 LAB — SARS CORONAVIRUS 2 BY RT PCR (HOSPITAL ORDER, PERFORMED IN ~~LOC~~ HOSPITAL LAB): SARS Coronavirus 2: NEGATIVE

## 2020-11-07 MED ORDER — OXYCODONE HCL 5 MG PO TABS
5.0000 mg | ORAL_TABLET | Freq: Four times a day (QID) | ORAL | Status: DC | PRN
  Administered 2020-11-07: 10 mg via ORAL
  Filled 2020-11-07: qty 2

## 2020-11-07 MED ORDER — OXYCODONE-ACETAMINOPHEN 5-325 MG PO TABS: 1.0000 | ORAL_TABLET | ORAL | 0 refills | Status: DC | PRN

## 2020-11-07 MED ORDER — ENOXAPARIN SODIUM 30 MG/0.3ML ~~LOC~~ SOLN: 30.0000 mg | Freq: Two times a day (BID) | SUBCUTANEOUS | 0 refills | Status: DC

## 2020-11-07 MED ORDER — METHOCARBAMOL 500 MG PO TABS: 500.0000 mg | ORAL_TABLET | Freq: Four times a day (QID) | ORAL | 0 refills | Status: DC | PRN

## 2020-11-07 NOTE — Progress Notes (Signed)
Physical Therapy Treatment Patient Details Name: Kimberly [name] MRN: [id number] DOB: 1956-10-07 Today's Date: 11/07/2020    History of Present Illness Pt s/p R TKR and with hx of CVA (15), DM with neuropathy, DJD, Fibromyalgia, COPD, CHF, CAD, multiple back surgery with residual deficits on R LE    PT Comments     Pt continues very cooperative but limited this am by c/o increased pain/fatigue.   Follow Up Recommendations  SNF;Follow surgeon's recommendation for DC plan and follow-up therapies     Equipment Recommendations  Rolling walker with 5" wheels;3in1 (PT)    Recommendations for Other Services       Precautions / Restrictions Precautions Precautions: Fall;Knee Required Braces or Orthoses: Knee Immobilizer - Right Knee Immobilizer - Right: On at all times Restrictions Weight Bearing Restrictions: No Other Position/Activity Restrictions: WBAT    Mobility  Bed Mobility Overal bed mobility: Needs Assistance Bed Mobility: Supine to Sit     Supine to sit: Min assist     General bed mobility comments: cues for sequence and use of L LE to self assist.  Transfers Overall transfer level: Needs assistance Equipment used: Rolling walker (2 wheeled) Transfers: Sit to/from Stand Sit to Stand: Min assist         General transfer comment: cues for LE management and use of UEs to self assist  Ambulation/Gait Ambulation/Gait assistance: Min assist Gait Distance (Feet): 38 Feet (and 20' into bathroom) Assistive device: Rolling walker (2 wheeled) Gait Pattern/deviations: Step-to pattern;Decreased step length - right;Decreased step length - left;Shuffle;Trunk flexed Gait velocity: decr   General Gait Details: cues for sequence, posture and position from RW.   Stairs             Wheelchair Mobility    Modified Rankin (Stroke Patients Only)       Balance Overall balance assessment: Needs assistance Sitting-balance support: Feet supported;No upper  extremity supported Sitting balance-Leahy Scale: Good     Standing balance support: Bilateral upper extremity supported Standing balance-Leahy Scale: Poor                              Cognition Arousal/Alertness: Awake/alert Behavior During Therapy: WFL for tasks assessed/performed Overall Cognitive Status: Within Functional Limits for tasks assessed                                        Exercises Total Joint Exercises Ankle Circles/Pumps: AROM;Both;15 reps;Supine Quad Sets: AROM;Both;10 reps;Supine Heel Slides: AAROM;Supine;15 reps;Right Straight Leg Raises: AAROM;Right;10 reps;Supine    General Comments        Pertinent Vitals/Pain Pain Assessment: 0-10 Pain Score: 6  Pain Location: R knee Pain Descriptors / Indicators: Aching;Sore Pain Intervention(s): Limited activity within patient's tolerance;Monitored during session;Premedicated before session;Ice applied    Home Living                      Prior Function            PT Goals (current goals can now be found in the care plan section) Acute Rehab PT Goals Patient Stated Goal: Regain IND PT Goal Formulation: With patient Time For Goal Achievement: 11/12/20 Potential to Achieve Goals: Fair Progress towards PT goals: Progressing toward goals    Frequency    7X/week      PT Plan Current plan remains appropriate  Co-evaluation              AM-PAC PT "6 Clicks" Mobility   Outcome Measure  Help needed turning from your back to your side while in a flat bed without using bedrails?: A Little Help needed moving from lying on your back to sitting on the side of a flat bed without using bedrails?: A Little Help needed moving to and from a bed to a chair (including a wheelchair)?: A Little Help needed standing up from a chair using your arms (e.g., wheelchair or bedside chair)?: A Little Help needed to walk in hospital room?: A Little Help needed climbing 3-5  steps with a railing? : A Lot 6 Click Score: 17    End of Session Equipment Utilized During Treatment: Gait belt;Right knee immobilizer Activity Tolerance: Patient tolerated treatment well;Patient limited by fatigue;Patient limited by pain Patient left: Other (comment) (bathroom with CNA) Nurse Communication: Mobility status PT Visit Diagnosis: Difficulty in walking, not elsewhere classified (R26.2);Pain Pain - Right/Left: Right Pain - part of body: Knee     Time: 0917-0940 PT Time Calculation (min) (ACUTE ONLY): 23 min  Charges:  $Gait Training: 8-22 mins $Therapeutic Exercise: 8-22 mins                     Debe Coder PT Acute Rehabilitation Services Pager [phone] Office [phone]    [facility] 11/07/2020, 12:33 PM

## 2020-11-07 NOTE — Progress Notes (Signed)
Report given to Levada Dy at the [facility]. Patient awaiting transport via ambulance. [phone]

## 2020-11-07 NOTE — Discharge Instructions (Signed)
INSTRUCTIONS AFTER JOINT REPLACEMENT   o Remove items at home which could result in a fall. This includes throw rugs or furniture in walking pathways o ICE to the affected joint every three hours while awake for 30 minutes at a time, for at least the first 3-5 days, and then as needed for pain and swelling.  Continue to use ice for pain and swelling. You may notice swelling that will progress down to the foot and ankle.  This is normal after surgery.  Elevate your leg when you are not up walking on it.   o Continue to use the breathing machine you got in the hospital (incentive spirometer) which will help keep your temperature down.  It is common for your temperature to cycle up and down following surgery, especially at night when you are not up moving around and exerting yourself.  The breathing machine keeps your lungs expanded and your temperature down.   DIET:  As you were doing prior to hospitalization, we recommend a well-balanced diet.  DRESSING / WOUND CARE / SHOWERING  Ok to shower 3 days postop.  Wound does not need to be covered.  No tub soaking.   You may change your dressing 3-5 days after surgery.  Then change the dressing every day with sterile gauze.  Please use good hand washing techniques before changing the dressing.  Do not use any lotions or creams on the incision until instructed by your surgeon.   RN at facility to perform daily wound checks.   ACTIVITY  o Increase activity slowly as tolerated, but follow the weight bearing instructions below.   o No driving for 6 weeks or until further direction given by your physician.  You cannot drive while taking narcotics.  o No lifting or carrying greater than 10 lbs. until further directed by your surgeon. o Avoid periods of inactivity such as sitting longer than an hour when not asleep. This helps prevent blood clots.  o You may return to work once you are authorized by your doctor.     WEIGHT BEARING   Weight bearing as  tolerated with assist device (walker, cane, etc) as directed, use it as long as suggested by your surgeon or therapist, typically at least 4-6 weeks.   EXERCISES  Results after joint replacement surgery are often greatly improved when you follow the exercise, range of motion and muscle strengthening exercises prescribed by your doctor. Safety measures are also important to protect the joint from further injury. Any time any of these exercises cause you to have increased pain or swelling, decrease what you are doing until you are comfortable again and then slowly increase them. If you have problems or questions, call your caregiver or physical therapist for advice.   Rehabilitation is important following a joint replacement. After just a few days of immobilization, the muscles of the leg can become weakened and shrink (atrophy).  These exercises are designed to build up the tone and strength of the thigh and leg muscles and to improve motion. Often times heat used for twenty to thirty minutes before working out will loosen up your tissues and help with improving the range of motion but do not use heat for the first two weeks following surgery (sometimes heat can increase post-operative swelling).   These exercises can be done on a training (exercise) mat, on the floor, on a table or on a bed. Use whatever works the best and is most comfortable for you.    Use  music or television while you are exercising so that the exercises are a pleasant break in your day. This will make your life better with the exercises acting as a break in your routine that you can look forward to.   Perform all exercises about fifteen times, three times per day or as directed.  You should exercise both the operative leg and the other leg as well.  Exercises include:    Quad Sets - Tighten up the muscle on the front of the thigh (Quad) and hold for 5-10 seconds.    Straight Leg Raises - With your knee straight (if you were given  a brace, keep it on), lift the leg to 60 degrees, hold for 3 seconds, and slowly lower the leg.  Perform this exercise against resistance later as your leg gets stronger.   Leg Slides: Lying on your back, slowly slide your foot toward your buttocks, bending your knee up off the floor (only go as far as is comfortable). Then slowly slide your foot back down until your leg is flat on the floor again.   Angel Wings: Lying on your back spread your legs to the side as far apart as you can without causing discomfort.   Hamstring Strength:  Lying on your back, push your heel against the floor with your leg straight by tightening up the muscles of your buttocks.  Repeat, but this time bend your knee to a comfortable angle, and push your heel against the floor.  You may put a pillow under the heel to make it more comfortable if necessary.   A rehabilitation program following joint replacement surgery can speed recovery and prevent re-injury in the future due to weakened muscles. Contact your doctor or a physical therapist for more information on knee rehabilitation.    CONSTIPATION  Constipation is defined medically as fewer than three stools per week and severe constipation as less than one stool per week.  Even if you have a regular bowel pattern at home, your normal regimen is likely to be disrupted due to multiple reasons following surgery.  Combination of anesthesia, postoperative narcotics, change in appetite and fluid intake all can affect your bowels.   YOU MUST use at least one of the following options; they are listed in order of increasing strength to get the job done.  They are all available over the counter, and you may need to use some, POSSIBLY even all of these options:    Drink plenty of fluids (prune juice may be helpful) and high fiber foods Colace 100 mg by mouth twice a day  Senokot for constipation as directed and as needed Dulcolax (bisacodyl), take with full glass of water  Miralax  (polyethylene glycol) once or twice a day as needed.  If you have tried all these things and are unable to have a bowel movement in the first 3-4 days after surgery call either your surgeon or your primary doctor.    If you experience loose stools or diarrhea, hold the medications until you stool forms back up.  If your symptoms do not get better within 1 week or if they get worse, check with your doctor.  If you experience "the worst abdominal pain ever" or develop nausea or vomiting, please contact the office immediately for further recommendations for treatment.   ITCHING:  If you experience itching with your medications, try taking only a single pain pill, or even half a pain pill at a time.  You can also use  Benadryl over the counter for itching or also to help with sleep.   TED HOSE STOCKINGS:  Use stockings on both legs until for at least 2 weeks or as directed by physician office. They may be removed at night for sleeping.  MEDICATIONS:  See your medication summary on the After Visit Summary that nursing will review with you.  You may have some home medications which will be placed on hold until you complete the course of blood thinner medication.  It is important for you to complete the blood thinner medication as prescribed.  PRECAUTIONS:  If you experience chest pain or shortness of breath - call 911 immediately for transfer to the hospital emergency department.   If you develop a fever greater that 101 F, purulent drainage from wound, increased redness or drainage from wound, foul odor from the wound/dressing, or calf pain - CONTACT YOUR SURGEON.                                                   FOLLOW-UP APPOINTMENTS:  If you do not already have a post-op appointment, please call the office for an appointment to be seen by your surgeon.  Guidelines for how soon to be seen are listed in your After Visit Summary, but are typically between 1-4 weeks after surgery.  OTHER  INSTRUCTIONS:   Knee Replacement:  Do not place pillow under knee, focus on keeping the knee straight while resting. CPM instructions: 0-90 degrees, 2 hours in the morning, 2 hours in the afternoon, and 2 hours in the evening. Place foam block, curve side up under heel at all times except when in CPM or when walking.  DO NOT modify, tear, cut, or change the foam block in any way.   DENTAL ANTIBIOTICS:  In most cases prophylactic antibiotics for Dental procdeures after total joint surgery are not necessary.  Exceptions are as follows:  1. History of prior total joint infection  2. Severely immunocompromised (Organ Transplant, cancer chemotherapy, Rheumatoid biologic meds such as Humera)  3. Poorly controlled diabetes (A1C &gt; 8.0, blood glucose over 200)  If you have one of these conditions, contact your surgeon for an antibiotic prescription, prior to your dental procedure.   MAKE SURE YOU:   Understand these instructions.   Get help right away if you are not doing well or get worse.    Thank you for letting us be a part of your medical care team.  It is a privilege we respect greatly.  We hope these instructions will help you stay on track for a fast and full recovery!

## 2020-11-07 NOTE — Progress Notes (Signed)
Subjective: Doing well.  C/o pain right knee.  Ready for transfer to snf today.    Objective: Vital signs in last 24 hours: Temp:  [97.5 F (36.4 C)-98.9 F (37.2 C)] 98.6 F (37 C) (12/17 0531) Pulse Rate:  [54-62] 54 (12/17 0531) Resp:  [14-16] 16 (12/17 0531) BP: (106-139)/(47-66) 139/66 (12/17 0531) SpO2:  [97 %-99 %] 98 % (12/17 0827)  Intake/Output from previous day: 12/16 0701 - 12/17 0700 In: 120 [P.O.:120] Out: 2400 [Urine:2400] Intake/Output this shift: Total I/O In: 500 [P.O.:500] Out: -   Recent Labs    11/05/20 1626 11/06/20 0313 11/07/20 0324  HGB 10.9* 9.1* 9.2*   Recent Labs    11/06/20 0313 11/07/20 0324  WBC 6.3 7.7  RBC 2.66* 2.63*  HCT 27.5* 27.8*  PLT 85* 87*   Recent Labs    11/05/20 1626 11/06/20 0313  NA  --  135  K  --  3.9  CL  --  109  CO2  --  19*  BUN  --  21  CREATININE 0.97 1.02*  GLUCOSE  --  217*  CALCIUM  --  8.2*   No results for input(s): LABPT, INR in the last 72 hours.  Exam: Pleasant female, alert and oriented. NAD.  Wound looks good.  Staples intact.  No drainage or signs of infection.  Calf nontender.  NVI.      Assessment/Plan: Transfer to snf today.  Scripts on chart for percocet (pain), lovenox (dvt prophylaxis), and robaxin (muscle relaxer).  Follow up in our office 2 weeks postop.       Benjiman Core 11/07/2020, 12:46 PM

## 2020-11-07 NOTE — Discharge Summary (Signed)
Patient ID: Kimberly [name] MRN: [id number] DOB/AGE: 1956-05-29 64 y.o.  Admit date: 11/05/2020 Discharge date: 11/07/2020  Admission Diagnoses:  Active Problems:   Arthritis of right knee   Discharge Diagnoses:  Active Problems:   Arthritis of right knee  status post Procedure(s): RIGHT TOTAL KNEE ARTHROPLASTY  Past Medical History:  Diagnosis Date  . Anemia    PMH: as a child  . Arthritis   . Asthma   . CAD (coronary artery disease)    nonobstructive by cath, 6/08 (false positive Cardiolite) normal stress echo, 7/11  . CHF (congestive heart failure) ([place])   . Chronic back pain   . Chronic bronchitis ([place])   . Cirrhosis ([place])   . Complication of anesthesia    had an asthma attack when woke up from procedure  . Complication of anesthesia    asthma attack with one surgery  . COPD (chronic obstructive pulmonary disease) ([place])   . Degenerative joint disease   . Depression   . Diabetes mellitus without complication ([place])   . DJD (degenerative joint disease)   . Fatty liver   . Fibromyalgia   . GERD (gastroesophageal reflux disease)   . Glaucoma   . Headache(784.0)   . Heart failure, diastolic, chronic ([place])   . Heart murmur    PMH:As a child only  . Hernia of abdominal cavity    "upper and lower hernia"  . History of hiatal hernia   . HTN (hypertension)   . Hypertension   . Hypothyroidism   . IBS (irritable bowel syndrome)   . IDDM (insulin dependent diabetes mellitus)   . Morbid obesity ([place])   . Neuropathy    associated with diabetes  . Obstructive sleep apnea   . Pancreatitis   . Pinched nerve in neck   . Pneumonia    as a child  . Pulmonary hypertension ([place])   . Rheumatic fever    PMH: as a child  . Seizures ([place])    PMH: only as a child  . Seizures ([place])    in childhood  . Shortness of breath   . Sleep apnea   . Spinal stenosis   . Stroke ([place])    12/09/2013    Surgeries: Procedure(s): RIGHT TOTAL KNEE ARTHROPLASTY on 11/05/2020    Consultants:   Discharged Condition: Improved  Hospital Course: Kimberly [name] is an 64 y.o. female who was admitted 11/05/2020 for operative treatment of right knee djd. Patient failed conservative treatments (please see the history and physical for the specifics) and had severe unremitting pain that affects sleep, daily activities and work/hobbies. After pre-op clearance, the patient was taken to the operating room on 11/05/2020 and underwent  Procedure(s): RIGHT TOTAL KNEE ARTHROPLASTY.    Patient was given perioperative antibiotics:  Anti-infectives (From admission, onward)   Start     Dose/Rate Route Frequency Ordered Stop   11/05/20 1015  ceFAZolin (ANCEF) IVPB 2g/100 mL premix        2 g 200 mL/hr over 30 Minutes Intravenous On call to O.R. 11/05/20 1014 11/05/20 1305       Patient was given sequential compression devices and early ambulation to prevent DVT.   Patient benefited maximally from hospital stay and there were no complications. At the time of discharge, the patient was urinating/moving their bowels without difficulty, tolerating a regular diet, pain is controlled with oral pain medications and they have been cleared by PT/OT.   Recent vital signs:  Patient Vitals for the past  24 hrs:  BP Temp Temp src Pulse Resp SpO2  11/07/20 0827 -- -- -- -- -- 98 %  11/07/20 0531 139/66 98.6 F (37 C) Oral (!) 54 16 99 %  11/06/20 2117 (!) 113/51 98.9 F (37.2 C) Oral 62 16 99 %  11/06/20 1722 (!) 114/56 98.3 F (36.8 C) Oral (!) 59 16 98 %  11/06/20 1324 (!) 106/47 (!) 97.5 F (36.4 C) Oral 62 14 97 %     Recent laboratory studies:  Recent Labs    11/05/20 1626 11/06/20 0313 11/07/20 0324  WBC 2.4* 6.3 7.7  HGB 10.9* 9.1* 9.2*  HCT 31.8* 27.5* 27.8*  PLT 90* 85* 87*  NA  --  135  --   K  --  3.9  --   CL  --  109  --   CO2  --  19*  --   BUN  --  21  --   CREATININE 0.97 1.02*  --   GLUCOSE  --  217*  --   CALCIUM  --  8.2*  --      Discharge  Medications:   Allergies as of 11/07/2020      Reactions   Cinnamon Anaphylaxis   Hydrocodone Other (See Comments)   Over sedation   Naproxen Anaphylaxis, Other (See Comments)   Throat swelling   Other Shortness Of Breath, Other (See Comments)   Local anesthesia Had asthma attack   Other Shortness Of Breath, Other (See Comments)   Pt states a sedative agent used prior to a surgical procedure ( Pt states it "sounds like Phenergan. It should be in my records from [place].") caused an asthma attack.    Oxycodone Other (See Comments)   Over sedation   Feldene [piroxicam] Nausea Only   Lyrica [pregabalin] Other (See Comments)   Altered mental status for prolonged period   Nsaids    Avoid due to history of gastic bypass   Phenergan [promethazine Hcl] Other (See Comments)   triggers asthma attacks   Sulfa Antibiotics Other (See Comments)   (Yeast infection, per patient.)   Latex Rash, Other (See Comments)   "Blistery" rash.    Tape Rash, Other (See Comments)   Do not use adhesive tape; okay to use paper tape.      Medication List    STOP taking these medications   acetaminophen 500 MG tablet Commonly known as: TYLENOL     TAKE these medications   albuterol (2.5 MG/3ML) 0.083% nebulizer solution Commonly known as: PROVENTIL Inhale 2.5 mg into the lungs every 6 (six) hours as needed for wheezing or shortness of breath.   albuterol 108 (90 Base) MCG/ACT inhaler Commonly known as: VENTOLIN HFA Inhale 2 puffs into the lungs 4 (four) times daily as needed for shortness of breath.   atorvastatin 10 MG tablet Commonly known as: LIPITOR Take 10 mg by mouth daily.   Breo Ellipta 200-25 MCG/INH Aepb Generic drug: fluticasone furoate-vilanterol Inhale 1 puff into the lungs daily.   CALCIUM 500+D PO Take 1 tablet by mouth in the morning, at noon, and at bedtime. Chewable   enoxaparin 30 MG/0.3ML injection Commonly known as: LOVENOX Inject 0.3 mLs (30 mg total) into the skin  every 12 (twelve) hours for 21 days. Must use x 3 weeks for postop DVT prophylaxis   famotidine 40 MG tablet Commonly known as: PEPCID Take 40 mg by mouth every morning.   ferrous sulfate 324 MG Tbec Take 324 mg by mouth daily with  breakfast.   levothyroxine 112 MCG tablet Commonly known as: SYNTHROID Take 112 mcg by mouth daily before breakfast.   methocarbamol 500 MG tablet Commonly known as: ROBAXIN Take 1 tablet (500 mg total) by mouth every 6 (six) hours as needed for muscle spasms.   MULTIVITAMIN PO Take by mouth 3 (three) times daily. ADEK vitamin chewable   ondansetron 4 MG disintegrating tablet Commonly known as: ZOFRAN-ODT Take 4 mg by mouth every 8 (eight) hours as needed for nausea or vomiting.   oxyCODONE-acetaminophen 5-325 MG tablet Commonly known as: Percocet Take 1 tablet by mouth every 4 (four) hours as needed for severe pain.   PROBIOTIC PO Take 1 tablet by mouth daily with supper. [employer]   spironolactone 25 MG tablet Commonly known as: ALDACTONE Take 25 mg by mouth daily.   Vitamin B-12 5000 MCG Subl Place 5,000 mcg under the tongue every other day.   Zinc 15 MG Caps Take 15 mg by mouth daily.       Diagnostic Studies: No results found.     Contact information for follow-up providers    Marybelle Killings, MD. Go on 11/20/2020.   Specialty: Orthopedic Surgery Why: Actual appointment is at [facility] office location at 1:00 pm.  Contact information: [place] [address] [phone]        [facility].   Specialty: [place] Why: Someone from the home health agency will be in contact with you to discuss your first in home physical therapy appointment Contact information: [place] [address] [phone]            Contact information for after-discharge care    [facility] .   Service: Skilled Nursing Contact  information: [address] [place] [phone]                  Discharge Plan:  discharge to snf  Disposition:     Signed: Benjiman Core for Rodell Perna MD [phone] 11/07/2020, 12:35 PM

## 2020-11-07 NOTE — TOC Transition Note (Signed)
Transition of Care [facility]) - CM/SW Discharge Note   Patient Details  Name: Kimberly [name] MRN: [id number] Date of Birth: August 30, 1956  [facility] CM/SW Contact:  Lennart Pall, LCSW Phone Number: 11/07/2020, 1:54 PM   Clinical Narrative:    Have reveiwed SNF bed offers with pt and she has accepted bed at [facility].  Insurance auth received.  Pt to transfer to facility via [place] .  No further TOC needs.  (ins Josem Kaufmann ref# [id number])   Final next level of care: Skilled Nursing Facility Barriers to Discharge: Barriers Resolved   Patient Goals and CMS Choice Patient states their goals for this hospitalization and ongoing recovery are:: to eventually return home after rehab CMS Medicare.gov Compare Post Acute Care list provided to:: Patient Choice offered to / list presented to : Patient  Discharge Placement PASRR number recieved: 11/06/20            Patient chooses bed at: [facility] Patient to be transferred to facility by: [facility] Name of family member notified: pt notified spouse Patient and family notified of of transfer: 11/07/20  Discharge Plan and Services In-house Referral: Clinical Social Work   Post Acute Care Choice: [place]          DME Arranged: 3-N-1,[place] rolling DME Agency: [employer]       HH Arranged: PT [place] 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 (now [facility])        Social Determinants of Health ([place]) Interventions     Readmission Risk Interventions No flowsheet data found.

## 2020-11-10 ENCOUNTER — Telehealth: Payer: Self-pay | Admitting: *Deleted

## 2020-11-10 DIAGNOSIS — K219 Gastro-esophageal reflux disease without esophagitis: Secondary | ICD-10-CM | POA: Diagnosis not present

## 2020-11-10 DIAGNOSIS — J45909 Unspecified asthma, uncomplicated: Secondary | ICD-10-CM | POA: Diagnosis not present

## 2020-11-10 DIAGNOSIS — M25561 Pain in right knee: Secondary | ICD-10-CM | POA: Diagnosis not present

## 2020-11-10 DIAGNOSIS — E785 Hyperlipidemia, unspecified: Secondary | ICD-10-CM | POA: Diagnosis not present

## 2020-11-10 DIAGNOSIS — E039 Hypothyroidism, unspecified: Secondary | ICD-10-CM | POA: Diagnosis not present

## 2020-11-10 NOTE — Telephone Encounter (Signed)
Attempted Ortho bundle D/C call to patient; left VM requesting call back.

## 2020-11-11 ENCOUNTER — Telehealth: Payer: Self-pay | Admitting: *Deleted

## 2020-11-11 NOTE — Telephone Encounter (Signed)
2nd attempt at D/C call to patient; no answer and left VM. She is currently at [facility] for [place]. Left VM and email to SW at facility to check patient's status.

## 2020-11-13 ENCOUNTER — Telehealth: Payer: Self-pay | Admitting: *Deleted

## 2020-11-13 NOTE — Care Plan (Signed)
RNCM received call from patient stating she is going home tomorrow. She requested assistance with DME (FWW) and states [facility] couldn't see her unitl 11/27/20 due to short staffing. CM confirmed this information with [facility]. Referral made to [facility] and they could see patient on Wednesday 11/19/20 at 1:00 pm. Patient aware. Also spoke with [facility] in [place] and they could provide [place] and bill to carrier. Faxed orders to pharmacy and OPPT for start of care. Patient and SW at facility aware of discharge needs taken care of. No other needs at this time.

## 2020-11-13 NOTE — Telephone Encounter (Signed)
Ortho bundle call completed. See care plan note.

## 2020-11-13 NOTE — Telephone Encounter (Signed)
Ortho bundle 7 day call completed. Spoke with SW at [facility]- patient planning to discharge tomorrow to home. DME ordered from facility and CM arranged 1 week of HHPT through next week. Referral made to [facility].

## 2020-11-14 DIAGNOSIS — M25561 Pain in right knee: Secondary | ICD-10-CM | POA: Diagnosis not present

## 2020-11-14 DIAGNOSIS — Z471 Aftercare following joint replacement surgery: Secondary | ICD-10-CM | POA: Diagnosis not present

## 2020-11-19 DIAGNOSIS — M25561 Pain in right knee: Secondary | ICD-10-CM | POA: Diagnosis not present

## 2020-11-20 ENCOUNTER — Encounter: Payer: Self-pay | Admitting: Orthopaedic Surgery

## 2020-11-20 ENCOUNTER — Other Ambulatory Visit: Payer: Self-pay

## 2020-11-20 ENCOUNTER — Ambulatory Visit (INDEPENDENT_AMBULATORY_CARE_PROVIDER_SITE_OTHER): Payer: Medicare Other | Admitting: Orthopaedic Surgery

## 2020-11-20 ENCOUNTER — Telehealth: Payer: Self-pay | Admitting: *Deleted

## 2020-11-20 ENCOUNTER — Ambulatory Visit: Payer: Self-pay

## 2020-11-20 VITALS — Ht 60.0 in | Wt 135.0 lb

## 2020-11-20 DIAGNOSIS — Z09 Encounter for follow-up examination after completed treatment for conditions other than malignant neoplasm: Secondary | ICD-10-CM | POA: Diagnosis not present

## 2020-11-20 DIAGNOSIS — I700 Atherosclerosis of aorta: Secondary | ICD-10-CM | POA: Diagnosis not present

## 2020-11-20 DIAGNOSIS — Z96651 Presence of right artificial knee joint: Secondary | ICD-10-CM

## 2020-11-20 DIAGNOSIS — J449 Chronic obstructive pulmonary disease, unspecified: Secondary | ICD-10-CM | POA: Diagnosis not present

## 2020-11-20 DIAGNOSIS — E039 Hypothyroidism, unspecified: Secondary | ICD-10-CM | POA: Diagnosis not present

## 2020-11-20 DIAGNOSIS — Z299 Encounter for prophylactic measures, unspecified: Secondary | ICD-10-CM | POA: Diagnosis not present

## 2020-11-20 NOTE — Progress Notes (Signed)
Post-Op Visit Note   Patient: Kimberly [name]           Date of Birth: 1956/06/15           MRN: [id number] Visit Date: 11/20/2020 PCP: Glenda Chroman, MD   Assessment & Plan: Post left total knee arthroplasty.  Staples harvested.  She is flexing past 90.  10 degree extension lag 5 degrees passive lack of full extension.  She will work on stretching, quad strength.  She states the therapy cost is prohibitive and she can go much less frequent work on a home exercise program.  We discussed the importance of making sure she does all her exercises to get a good result otherwise she will have persistent problems with stiffness and pain and weakness.  Chief Complaint:  Chief Complaint  Patient presents with  . Right Knee - Routine Post Op    11/05/2020 Right TKA   Visit Diagnoses:  1. S/P total knee arthroplasty, right     Plan: Return 4 weeks.  Follow-Up Instructions: Return in about 4 weeks (around 12/18/2020).   Orders:  Orders Placed This Encounter  Procedures  . XR Knee 1-2 Views Right   No orders of the defined types were placed in this encounter.   Imaging: No results found.  PMFS History: Patient Active Problem List   Diagnosis Date Noted  . Arthritis of right knee 11/05/2020  . Morbid obesity ([place]) 02/08/2018  . Guaiac positive stools 04/21/2017  . Pancytopenia ([place]) 04/09/2017  . Other cirrhosis of liver ([place]) 04/09/2017  . Acute pancreatitis 04/06/2017  . DM type 2 causing vascular disease ([place]) 04/06/2017  . HTN (hypertension) 04/06/2017  . OSA (obstructive sleep apnea) 04/06/2017  . Asthma in adult 04/06/2017  . Obesity (BMI 30-39.9) 04/06/2017  . Primary osteoarthritis of both knees 10/26/2016  . Primary osteoarthritis of both hands 10/26/2016  . Primary osteoarthritis of both feet 10/26/2016  . COPD (chronic obstructive pulmonary disease) ([place]) 09/27/2016  . Hypothyroidism 09/27/2016  . Vertebral compression fracture ([place]) 09/27/2016  . Fatty liver  09/27/2016  . Fibromyalgia 09/27/2016  . Restless leg syndrome 09/27/2016  . Lumbar radiculitis 10/01/2014  . Nerve pain 08/30/2014  . Lumbar stenosis with neurogenic claudication 08/28/2014  . Spondylosis of lumbar region without myelopathy or radiculopathy 08/23/2014  . Lumbosacral stenosis with neurogenic claudication ([place]) 08/23/2014  . Vaginal discharge 06/22/2014  . Chest pain 06/21/2014  . Spinal stenosis 08/23/2011  . Obstructive sleep apnea 08/23/2011  . Diabetes ([place]) 08/12/2010  . CAD 08/12/2010  . Chronic diastolic heart failure ([place]) 08/12/2010  . Mixed hyperlipidemia 06/23/2009  . OVERWEIGHT/OBESITY 06/23/2009  . Essential hypertension, benign 06/23/2009  . CHEST PAIN-UNSPECIFIED 06/23/2009   Past Medical History:  Diagnosis Date  . Anemia    PMH: as a child  . Arthritis   . Asthma   . CAD (coronary artery disease)    nonobstructive by cath, 6/08 (false positive Cardiolite) normal stress echo, 7/11  . CHF (congestive heart failure) ([place])   . Chronic back pain   . Chronic bronchitis ([place])   . Cirrhosis ([place])   . Complication of anesthesia    had an asthma attack when woke up from procedure  . Complication of anesthesia    asthma attack with one surgery  . COPD (chronic obstructive pulmonary disease) ([place])   . Degenerative joint disease   . Depression   . Diabetes mellitus without complication ([place])   . DJD (degenerative joint disease)   . Fatty  liver   . Fibromyalgia   . GERD (gastroesophageal reflux disease)   . Glaucoma   . Headache(784.0)   . Heart failure, diastolic, chronic ([place])   . Heart murmur    PMH:As a child only  . Hernia of abdominal cavity    "upper and lower hernia"  . History of hiatal hernia   . HTN (hypertension)   . Hypertension   . Hypothyroidism   . IBS (irritable bowel syndrome)   . IDDM (insulin dependent diabetes mellitus)   . Morbid obesity ([place])   . Neuropathy    associated with diabetes  . Obstructive sleep apnea   .  Pancreatitis   . Pinched nerve in neck   . Pneumonia    as a child  . Pulmonary hypertension ([place])   . Rheumatic fever    PMH: as a child  . Seizures ([place])    PMH: only as a child  . Seizures ([place])    in childhood  . Shortness of breath   . Sleep apnea   . Spinal stenosis   . Stroke [facility])    12/09/2013    Family History  Problem Relation Age of Onset  . Cancer Other   . Heart failure Other   . Colon cancer Brother     Past Surgical History:  Procedure Laterality Date  . ABDOMINAL HYSTERECTOMY    . ABDOMINAL HYSTERECTOMY     partial hysterectomy  . APPENDECTOMY    . BACK SURGERY    . BACK SURGERY     disc surgery with complications and damage to right side  . BILATERAL KNEE ARTHROSCOPY  2000, 2009  . CARDIAC CATHETERIZATION     2009  . CATARACT EXTRACTION W/ INTRAOCULAR LENS  IMPLANT, BILATERAL    . CHOLECYSTECTOMY  1994  . CHOLECYSTECTOMY    . COLONOSCOPY N/A 08/01/2013   Procedure: COLONOSCOPY;  Surgeon: Rogene Houston, MD;  Location[facility];  Service: Endoscopy;  Laterality: N/A;  930  . COLONOSCOPY N/A 08/03/2017   Procedure: COLONOSCOPY;  Surgeon: Rogene Houston, MD;  Location: [facility] ENDO SUITE;  Service: Endoscopy;  Laterality: N/A;  12:00  . CYST EXCISION     Left breast  . EXCISIONAL HEMORRHOIDECTOMY    . [place]  . EYE SURGERY     bilateral cataract removal with lens implants  . EYE SURGERY     growth removed from right eye lid  . EYE SURGERY     bilateral eye surgery age 88 t"to straighten eyes'  . heel (other)  2005  . HEEL SPUR SURGERY     Right heel - growth removal and rebuilding of heel  . HEMORRHOID SURGERY    . HERNIA REPAIR    . KNEE ARTHROSCOPY Bilateral   . LUMBAR LAMINECTOMY/DECOMPRESSION MICRODISCECTOMY Right 08/23/2014   Procedure: LUMBAR LAMINECTOMY/DECOMPRESSION MICRODISCECTOMY 1 LEVEL;  Surgeon: Charlie Pitter, MD;  Location: [facility] NEURO ORS;  Service: Neurosurgery;  Laterality: Right;  LUMBAR LAMINECTOMY/DECOMPRESSION  MICRODISCECTOMY 1 LEVEL LUMBAR 2-3  . ovaries removed  2003  . PARTIAL HYSTERECTOMY  1981  . SPHINCTEROTOMY    . spinahatomy  1992  . TOTAL KNEE ARTHROPLASTY Right 11/05/2020   Procedure: RIGHT TOTAL KNEE ARTHROPLASTY;  Surgeon: Marybelle Killings, MD;  Location: [facility] ORS;  Service: Orthopedics;  Laterality: Right;  . TUBAL LIGATION  1975  . TUBAL LIGATION     Social History   Occupational History  . Not on file  Tobacco Use  .  Smoking status: Never Smoker  . Smokeless tobacco: Never Used  Vaping Use  . Vaping Use: Never used  Substance and Sexual Activity  . Alcohol use: No  . Drug use: No  . Sexual activity: Not on file

## 2020-11-20 NOTE — Telephone Encounter (Signed)
Ortho bundle call to patient. She started OPPT yesterday at [facility] in [place]. She is concerned about $30 charge with each visit and asked about HHPT. I told her HHPT wasn't appropriate at this point and to do her best with scheduling and payment or home exercises worked out with the therapist. Also- she is still wearing the knee immobilizer (I guess the SNF advised her to).I explained that typically they are out of these by now and to discuss with you. She is scheduled to see you today. Just wanted you aware.

## 2020-11-23 DIAGNOSIS — J449 Chronic obstructive pulmonary disease, unspecified: Secondary | ICD-10-CM | POA: Diagnosis not present

## 2020-11-26 DIAGNOSIS — M25561 Pain in right knee: Secondary | ICD-10-CM | POA: Diagnosis not present

## 2020-12-01 DIAGNOSIS — H353231 Exudative age-related macular degeneration, bilateral, with active choroidal neovascularization: Secondary | ICD-10-CM | POA: Diagnosis not present

## 2020-12-12 ENCOUNTER — Telehealth: Payer: Self-pay | Admitting: *Deleted

## 2020-12-12 NOTE — Telephone Encounter (Signed)
Ortho bundle 30 day call completed. °

## 2020-12-18 ENCOUNTER — Ambulatory Visit (INDEPENDENT_AMBULATORY_CARE_PROVIDER_SITE_OTHER): Payer: Medicare Other | Admitting: Orthopaedic Surgery

## 2020-12-18 ENCOUNTER — Encounter: Payer: Self-pay | Admitting: Orthopaedic Surgery

## 2020-12-18 ENCOUNTER — Other Ambulatory Visit: Payer: Self-pay

## 2020-12-18 ENCOUNTER — Inpatient Hospital Stay: Payer: Medicare Other | Admitting: Orthopaedic Surgery

## 2020-12-18 DIAGNOSIS — M25561 Pain in right knee: Secondary | ICD-10-CM | POA: Diagnosis not present

## 2020-12-18 DIAGNOSIS — Z96651 Presence of right artificial knee joint: Secondary | ICD-10-CM | POA: Insufficient documentation

## 2020-12-18 NOTE — Progress Notes (Signed)
Post-Op Visit Note   Patient: Kimberly [name]           Date of Birth: 1955-12-23           MRN: [id number] Visit Date: 12/18/2020 PCP: Glenda Chroman, MD   Assessment & Plan: Post total knee arthroplasty with crouched knee gait. Flexion to 120. Quad is weak and I can overcome it with 1 finger and she lacks 10 degrees reaching full extension and is developing a knee flexion contracture. We went over the exercises she needs to do to get her knee straight prone positioning and more quad work.  Chief Complaint:  Chief Complaint  Patient presents with  . Right Knee - Follow-up    11/05/2020 Right TKA   Visit Diagnoses:  1. S/P total knee arthroplasty, right     Plan: Patient needs continued therapy she needs, positioning she needs to get her knee to full extension the knees and needs more quad strengthening. She states therapy cost her $30 a visit and she needs to work harder on her own if she wants to decrease her needed physical therapy visits. Recheck  Follow-Up Instructions: Return in about 4 weeks (around 01/15/2021).   Orders:  No orders of the defined types were placed in this encounter.  No orders of the defined types were placed in this encounter.   Imaging: No results found.  PMFS History: Patient Active Problem List   Diagnosis Date Noted  . S/P total knee arthroplasty, right 12/18/2020  . Morbid obesity ([place]) 02/08/2018  . Guaiac positive stools 04/21/2017  . Pancytopenia ([place]) 04/09/2017  . Other cirrhosis of liver ([place]) 04/09/2017  . Acute pancreatitis 04/06/2017  . DM type 2 causing vascular disease ([place]) 04/06/2017  . HTN (hypertension) 04/06/2017  . OSA (obstructive sleep apnea) 04/06/2017  . Asthma in adult 04/06/2017  . Obesity (BMI 30-39.9) 04/06/2017  . Primary osteoarthritis of both knees 10/26/2016  . Primary osteoarthritis of both hands 10/26/2016  . Primary osteoarthritis of both feet 10/26/2016  . COPD (chronic obstructive pulmonary disease)  ([place]) 09/27/2016  . Hypothyroidism 09/27/2016  . Vertebral compression fracture ([place]) 09/27/2016  . Fatty liver 09/27/2016  . Fibromyalgia 09/27/2016  . Restless leg syndrome 09/27/2016  . Lumbar radiculitis 10/01/2014  . Nerve pain 08/30/2014  . Lumbar stenosis with neurogenic claudication 08/28/2014  . Spondylosis of lumbar region without myelopathy or radiculopathy 08/23/2014  . Lumbosacral stenosis with neurogenic claudication ([place]) 08/23/2014  . Vaginal discharge 06/22/2014  . Chest pain 06/21/2014  . Spinal stenosis 08/23/2011  . Obstructive sleep apnea 08/23/2011  . Diabetes ([place]) 08/12/2010  . CAD 08/12/2010  . Chronic diastolic heart failure ([place]) 08/12/2010  . Mixed hyperlipidemia 06/23/2009  . OVERWEIGHT/OBESITY 06/23/2009  . Essential hypertension, benign 06/23/2009  . CHEST PAIN-UNSPECIFIED 06/23/2009   Past Medical History:  Diagnosis Date  . Anemia    PMH: as a child  . Arthritis   . Asthma   . CAD (coronary artery disease)    nonobstructive by cath, 6/08 (false positive Cardiolite) normal stress echo, 7/11  . CHF (congestive heart failure) ([place])   . Chronic back pain   . Chronic bronchitis ([place])   . Cirrhosis ([place])   . Complication of anesthesia    had an asthma attack when woke up from procedure  . Complication of anesthesia    asthma attack with one surgery  . COPD (chronic obstructive pulmonary disease) ([place])   . Degenerative joint disease   . Depression   .  Diabetes mellitus without complication ([place])   . DJD (degenerative joint disease)   . Fatty liver   . Fibromyalgia   . GERD (gastroesophageal reflux disease)   . Glaucoma   . Headache(784.0)   . Heart failure, diastolic, chronic ([place])   . Heart murmur    PMH:As a child only  . Hernia of abdominal cavity    "upper and lower hernia"  . History of hiatal hernia   . HTN (hypertension)   . Hypertension   . Hypothyroidism   . IBS (irritable bowel syndrome)   . IDDM (insulin dependent  diabetes mellitus)   . Morbid obesity ([place])   . Neuropathy    associated with diabetes  . Obstructive sleep apnea   . Pancreatitis   . Pinched nerve in neck   . Pneumonia    as a child  . Pulmonary hypertension ([place])   . Rheumatic fever    PMH: as a child  . Seizures ([place])    PMH: only as a child  . Seizures ([place])    in childhood  . Shortness of breath   . Sleep apnea   . Spinal stenosis   . Stroke [facility])    12/09/2013    Family History  Problem Relation Age of Onset  . Cancer Other   . Heart failure Other   . Colon cancer Brother     Past Surgical History:  Procedure Laterality Date  . ABDOMINAL HYSTERECTOMY    . ABDOMINAL HYSTERECTOMY     partial hysterectomy  . APPENDECTOMY    . BACK SURGERY    . BACK SURGERY     disc surgery with complications and damage to right side  . BILATERAL KNEE ARTHROSCOPY  2000, 2009  . CARDIAC CATHETERIZATION     2009  . CATARACT EXTRACTION W/ INTRAOCULAR LENS  IMPLANT, BILATERAL    . CHOLECYSTECTOMY  1994  . CHOLECYSTECTOMY    . COLONOSCOPY N/A 08/01/2013   Procedure: COLONOSCOPY;  Surgeon: Rogene Houston, MD;  [facility];  Service: Endoscopy;  Laterality: N/A;  930  . COLONOSCOPY N/A 08/03/2017   Procedure: COLONOSCOPY;  Surgeon: Rogene Houston, MD;  [facility];  Service: Endoscopy;  Laterality: N/A;  12:00  . CYST EXCISION     Left breast  . EXCISIONAL HEMORRHOIDECTOMY    . [place]  . EYE SURGERY     bilateral cataract removal with lens implants  . EYE SURGERY     growth removed from right eye lid  . EYE SURGERY     bilateral eye surgery age 59 t"to straighten eyes'  . heel (other)  2005  . HEEL SPUR SURGERY     Right heel - growth removal and rebuilding of heel  . HEMORRHOID SURGERY    . HERNIA REPAIR    . KNEE ARTHROSCOPY Bilateral   . LUMBAR LAMINECTOMY/DECOMPRESSION MICRODISCECTOMY Right 08/23/2014   Procedure: LUMBAR LAMINECTOMY/DECOMPRESSION MICRODISCECTOMY 1 LEVEL;  Surgeon: Charlie Pitter, MD;  Location: [place] [facility];  Service: Neurosurgery;  Laterality: Right;  LUMBAR LAMINECTOMY/DECOMPRESSION MICRODISCECTOMY 1 LEVEL LUMBAR 2-3  . ovaries removed  2003  . PARTIAL HYSTERECTOMY  1981  . SPHINCTEROTOMY    . spinahatomy  1992  . TOTAL KNEE ARTHROPLASTY Right 11/05/2020   Procedure: RIGHT TOTAL KNEE ARTHROPLASTY;  Surgeon: Marybelle Killings, MD;  [facility];  Service: Orthopedics;  Laterality: Right;  . TUBAL LIGATION  1975  . TUBAL LIGATION  Social History   Occupational History  . Not on file  Tobacco Use  . Smoking status: Never Smoker  . Smokeless tobacco: Never Used  Vaping Use  . Vaping Use: Never used  Substance and Sexual Activity  . Alcohol use: No  . Drug use: No  . Sexual activity: Not on file

## 2020-12-24 DIAGNOSIS — J449 Chronic obstructive pulmonary disease, unspecified: Secondary | ICD-10-CM | POA: Diagnosis not present

## 2020-12-25 DIAGNOSIS — M25561 Pain in right knee: Secondary | ICD-10-CM | POA: Diagnosis not present

## 2020-12-30 DIAGNOSIS — R6889 Other general symptoms and signs: Secondary | ICD-10-CM | POA: Diagnosis not present

## 2020-12-30 DIAGNOSIS — H353231 Exudative age-related macular degeneration, bilateral, with active choroidal neovascularization: Secondary | ICD-10-CM | POA: Diagnosis not present

## 2021-01-12 DIAGNOSIS — M25561 Pain in right knee: Secondary | ICD-10-CM | POA: Diagnosis not present

## 2021-01-15 ENCOUNTER — Other Ambulatory Visit: Payer: Self-pay

## 2021-01-15 ENCOUNTER — Encounter: Payer: Self-pay | Admitting: Orthopaedic Surgery

## 2021-01-15 ENCOUNTER — Ambulatory Visit (INDEPENDENT_AMBULATORY_CARE_PROVIDER_SITE_OTHER): Payer: Medicare Other | Admitting: Orthopaedic Surgery

## 2021-01-15 VITALS — Ht 60.0 in | Wt 125.0 lb

## 2021-01-15 DIAGNOSIS — Z96651 Presence of right artificial knee joint: Secondary | ICD-10-CM

## 2021-01-15 NOTE — Progress Notes (Signed)
Post-Op Visit Note   Patient: Kimberly [name]           Date of Birth: 04/19/1956           MRN: [id number] Visit Date: 01/15/2021 PCP: Glenda Chroman, MD   Assessment & Plan: Post right total knee arthroplasty.  Still does not have full extension when she gets prone positioning she only makes it 2 to 3 minutes then quit.  She has good flexion still has bilateral quad weakness.  She can only do a wall squat for about 8 to 10 seconds.  Need to continue to work on strengthening and work on extension prone positioning increase the weight more than 3 pounds and last longer than just a few minutes.  Knee does come out straighter with pressure and still bounces some.  We discussed the relief of pain she will get if she can get full extension and improve her gait which also take some pressure off her back.  She had previous lumbar decompression by Dr. Trenton Gammon many years ago and CT scan done of the abdomen showed some additional levels of narrowing above and below her decompression level at L3-4  Chief Complaint:  Chief Complaint  Patient presents with  . Right Knee - Follow-up    11/05/2020 Right TKA   Visit Diagnoses:  1. S/P total knee arthroplasty, right     Plan: Continue work on primarily knee extension and also quad strengthening flexion is good.  Follow-Up Instructions: Return in about 2 months (around 03/15/2021).   Orders:  No orders of the defined types were placed in this encounter.  No orders of the defined types were placed in this encounter.   Imaging: No results found.  PMFS History: Patient Active Problem List   Diagnosis Date Noted  . S/P total knee arthroplasty, right 12/18/2020  . Morbid obesity ([place]) 02/08/2018  . Guaiac positive stools 04/21/2017  . Pancytopenia ([place]) 04/09/2017  . Other cirrhosis of liver ([place]) 04/09/2017  . Acute pancreatitis 04/06/2017  . DM type 2 causing vascular disease ([place]) 04/06/2017  . HTN (hypertension) 04/06/2017  . OSA  (obstructive sleep apnea) 04/06/2017  . Asthma in adult 04/06/2017  . Obesity (BMI 30-39.9) 04/06/2017  . Primary osteoarthritis of both knees 10/26/2016  . Primary osteoarthritis of both hands 10/26/2016  . Primary osteoarthritis of both feet 10/26/2016  . COPD (chronic obstructive pulmonary disease) ([place]) 09/27/2016  . Hypothyroidism 09/27/2016  . Vertebral compression fracture ([place]) 09/27/2016  . Fatty liver 09/27/2016  . Fibromyalgia 09/27/2016  . Restless leg syndrome 09/27/2016  . Lumbar radiculitis 10/01/2014  . Nerve pain 08/30/2014  . Lumbar stenosis with neurogenic claudication 08/28/2014  . Spondylosis of lumbar region without myelopathy or radiculopathy 08/23/2014  . Lumbosacral stenosis with neurogenic claudication ([place]) 08/23/2014  . Vaginal discharge 06/22/2014  . Chest pain 06/21/2014  . Spinal stenosis 08/23/2011  . Obstructive sleep apnea 08/23/2011  . Diabetes ([place]) 08/12/2010  . CAD 08/12/2010  . Chronic diastolic heart failure ([place]) 08/12/2010  . Mixed hyperlipidemia 06/23/2009  . OVERWEIGHT/OBESITY 06/23/2009  . Essential hypertension, benign 06/23/2009  . CHEST PAIN-UNSPECIFIED 06/23/2009   Past Medical History:  Diagnosis Date  . Anemia    PMH: as a child  . Arthritis   . Asthma   . CAD (coronary artery disease)    nonobstructive by cath, 6/08 (false positive Cardiolite) normal stress echo, 7/11  . CHF (congestive heart failure) ([place])   . Chronic back pain   . Chronic bronchitis (  [place])   . Cirrhosis ([place])   . Complication of anesthesia    had an asthma attack when woke up from procedure  . Complication of anesthesia    asthma attack with one surgery  . COPD (chronic obstructive pulmonary disease) ([place])   . Degenerative joint disease   . Depression   . Diabetes mellitus without complication ([place])   . DJD (degenerative joint disease)   . Fatty liver   . Fibromyalgia   . GERD (gastroesophageal reflux disease)   . Glaucoma   . Headache(784.0)    . Heart failure, diastolic, chronic ([place])   . Heart murmur    PMH:As a child only  . Hernia of abdominal cavity    "upper and lower hernia"  . History of hiatal hernia   . HTN (hypertension)   . Hypertension   . Hypothyroidism   . IBS (irritable bowel syndrome)   . IDDM (insulin dependent diabetes mellitus)   . Morbid obesity ([place])   . Neuropathy    associated with diabetes  . Obstructive sleep apnea   . Pancreatitis   . Pinched nerve in neck   . Pneumonia    as a child  . Pulmonary hypertension ([place])   . Rheumatic fever    PMH: as a child  . Seizures ([place])    PMH: only as a child  . Seizures ([place])    in childhood  . Shortness of breath   . Sleep apnea   . Spinal stenosis   . Stroke [facility])    12/09/2013    Family History  Problem Relation Age of Onset  . Cancer Other   . Heart failure Other   . Colon cancer Brother     Past Surgical History:  Procedure Laterality Date  . ABDOMINAL HYSTERECTOMY    . ABDOMINAL HYSTERECTOMY     partial hysterectomy  . APPENDECTOMY    . BACK SURGERY    . BACK SURGERY     disc surgery with complications and damage to right side  . BILATERAL KNEE ARTHROSCOPY  2000, 2009  . CARDIAC CATHETERIZATION     2009  . CATARACT EXTRACTION W/ INTRAOCULAR LENS  IMPLANT, BILATERAL    . CHOLECYSTECTOMY  1994  . CHOLECYSTECTOMY    . COLONOSCOPY N/A 08/01/2013   Procedure: COLONOSCOPY;  Surgeon: Rogene Houston, MD;  [facility];  Service: Endoscopy;  Laterality: N/A;  930  . COLONOSCOPY N/A 08/03/2017   Procedure: COLONOSCOPY;  Surgeon: Rogene Houston, MD;  [facility];  Service: Endoscopy;  Laterality: N/A;  12:00  . CYST EXCISION     Left breast  . EXCISIONAL HEMORRHOIDECTOMY    . [place]  . EYE SURGERY     bilateral cataract removal with lens implants  . EYE SURGERY     growth removed from right eye lid  . EYE SURGERY     bilateral eye surgery age 63 t"to straighten eyes'  . heel (other)  2005  . HEEL  SPUR SURGERY     Right heel - growth removal and rebuilding of heel  . HEMORRHOID SURGERY    . HERNIA REPAIR    . KNEE ARTHROSCOPY Bilateral   . LUMBAR LAMINECTOMY/DECOMPRESSION MICRODISCECTOMY Right 08/23/2014   Procedure: LUMBAR LAMINECTOMY/DECOMPRESSION MICRODISCECTOMY 1 LEVEL;  Surgeon: Charlie Pitter, MD;  Location: [place] [facility];  Service: Neurosurgery;  Laterality: Right;  LUMBAR LAMINECTOMY/DECOMPRESSION MICRODISCECTOMY 1 LEVEL LUMBAR 2-3  . ovaries removed  2003  .  PARTIAL HYSTERECTOMY  1981  . SPHINCTEROTOMY    . spinahatomy  1992  . TOTAL KNEE ARTHROPLASTY Right 11/05/2020   Procedure: RIGHT TOTAL KNEE ARTHROPLASTY;  Surgeon: Marybelle Killings, MD;  [facility];  Service: Orthopedics;  Laterality: Right;  . TUBAL LIGATION  1975  . TUBAL LIGATION     Social History   Occupational History  . Not on file  Tobacco Use  . Smoking status: Never Smoker  . Smokeless tobacco: Never Used  Vaping Use  . Vaping Use: Never used  Substance and Sexual Activity  . Alcohol use: No  . Drug use: No  . Sexual activity: Not on file

## 2021-01-21 DIAGNOSIS — J449 Chronic obstructive pulmonary disease, unspecified: Secondary | ICD-10-CM | POA: Diagnosis not present

## 2021-01-27 DIAGNOSIS — R6889 Other general symptoms and signs: Secondary | ICD-10-CM | POA: Diagnosis not present

## 2021-01-27 DIAGNOSIS — E113293 Type 2 diabetes mellitus with mild nonproliferative diabetic retinopathy without macular edema, bilateral: Secondary | ICD-10-CM | POA: Diagnosis not present

## 2021-01-27 DIAGNOSIS — H35371 Puckering of macula, right eye: Secondary | ICD-10-CM | POA: Diagnosis not present

## 2021-01-27 DIAGNOSIS — H43823 Vitreomacular adhesion, bilateral: Secondary | ICD-10-CM | POA: Diagnosis not present

## 2021-01-27 DIAGNOSIS — H353231 Exudative age-related macular degeneration, bilateral, with active choroidal neovascularization: Secondary | ICD-10-CM | POA: Diagnosis not present

## 2021-02-06 ENCOUNTER — Telehealth: Payer: Self-pay | Admitting: *Deleted

## 2021-02-06 NOTE — Telephone Encounter (Signed)
Ortho bundle 90 day call completed.

## 2021-02-11 ENCOUNTER — Other Ambulatory Visit (HOSPITAL_COMMUNITY): Payer: Self-pay | Admitting: Nurse Practitioner

## 2021-02-11 DIAGNOSIS — R110 Nausea: Secondary | ICD-10-CM

## 2021-02-16 ENCOUNTER — Ambulatory Visit (HOSPITAL_COMMUNITY)
Admission: RE | Admit: 2021-02-16 | Discharge: 2021-02-16 | Disposition: A | Discharge status: Home / Self Care | Payer: Medicare Other | Source: Ambulatory Visit | Attending: Nurse Practitioner | Admitting: Nurse Practitioner

## 2021-02-16 DIAGNOSIS — K449 Diaphragmatic hernia without obstruction or gangrene: Secondary | ICD-10-CM | POA: Diagnosis not present

## 2021-02-16 DIAGNOSIS — R110 Nausea: Secondary | ICD-10-CM | POA: Diagnosis not present

## 2021-02-21 DIAGNOSIS — J449 Chronic obstructive pulmonary disease, unspecified: Secondary | ICD-10-CM | POA: Diagnosis not present

## 2021-02-24 DIAGNOSIS — H353231 Exudative age-related macular degeneration, bilateral, with active choroidal neovascularization: Secondary | ICD-10-CM | POA: Diagnosis not present

## 2021-02-24 DIAGNOSIS — R6889 Other general symptoms and signs: Secondary | ICD-10-CM | POA: Diagnosis not present

## 2021-03-10 DIAGNOSIS — G4730 Sleep apnea, unspecified: Secondary | ICD-10-CM | POA: Diagnosis not present

## 2021-03-10 DIAGNOSIS — R109 Unspecified abdominal pain: Secondary | ICD-10-CM | POA: Diagnosis not present

## 2021-03-10 DIAGNOSIS — I10 Essential (primary) hypertension: Secondary | ICD-10-CM | POA: Diagnosis not present

## 2021-03-10 DIAGNOSIS — K589 Irritable bowel syndrome without diarrhea: Secondary | ICD-10-CM | POA: Diagnosis not present

## 2021-03-10 DIAGNOSIS — Z299 Encounter for prophylactic measures, unspecified: Secondary | ICD-10-CM | POA: Diagnosis not present

## 2021-03-19 ENCOUNTER — Other Ambulatory Visit: Payer: Self-pay

## 2021-03-19 ENCOUNTER — Ambulatory Visit (INDEPENDENT_AMBULATORY_CARE_PROVIDER_SITE_OTHER): Payer: Medicare Other | Admitting: Orthopaedic Surgery

## 2021-03-19 DIAGNOSIS — Z96651 Presence of right artificial knee joint: Secondary | ICD-10-CM | POA: Diagnosis not present

## 2021-03-19 DIAGNOSIS — M6281 Muscle weakness (generalized): Secondary | ICD-10-CM | POA: Diagnosis not present

## 2021-03-19 NOTE — Progress Notes (Signed)
Office Visit Note   Patient: Kimberly [name]           Date of Birth: 28-Sep-1956           MRN: [id number] Visit Date: 03/19/2021              Requested by: Glenda Chroman, MD [place],  [id number] PCP: Glenda Chroman, MD   Assessment & Plan: Visit Diagnoses:  1. S/P total knee arthroplasty, right   2. Quadriceps weakness     Plan: Patient only had 2 or 3 outpatient visits in therapy due to out-of-pocket cost.  She states after May therapy when cost of $30 a visit and she wants to resume therapy since she has been unsuccessful getting more extension in her knee and quad strengthening on her own at home.  She is safe for community ambulation but still has quad weakness and fatigues.  Restart therapy for quad weakness and I will recheck her in 2 months.  We again discussed using prone positioning with ankle weights and staying longer than 2 or 3 minutes in order to stretch out a posterior capsule.  Follow-Up Instructions: Return in about 2 months (around 05/19/2021).   Orders:  No orders of the defined types were placed in this encounter.  No orders of the defined types were placed in this encounter.     Procedures: No procedures performed   Clinical Data: No additional findings.   Subjective: Chief Complaint  Patient presents with  . Right Knee - Routine Post Op    HPI 65 year old female returns post right total knee arthroplasty still has pain at the level of the joint line.  Despite efforts at prone positioning with ankle weights she can only make it 2 to 3 minutes with 3 to 5 pounds and then stops.  She lacks full extension I can still overcome her quad straight leg raise with single finger.  Doing exercises on her own but only does them for a number of seconds and then stops.  She is able ambulate without a cane but now is having some soreness in her left knee.  Review of Systems reviewed updated unchanged.   Objective: Vital Signs: There were no vitals  taken for this visit.  Physical Exam Constitutional:      Appearance: She is well-developed.  HENT:     Head: Normocephalic.     Right Ear: External ear normal.     Left Ear: External ear normal.  Eyes:     Pupils: Pupils are equal, round, and reactive to light.  Neck:     Thyroid: No thyromegaly.     Trachea: No tracheal deviation.  Cardiovascular:     Rate and Rhythm: Normal rate.  Pulmonary:     Effort: Pulmonary effort is normal.  Abdominal:     Palpations: Abdomen is soft.  Skin:    General: Skin is warm and dry.  Neurological:     Mental Status: She is alert and oriented to person, place, and time.  Psychiatric:        Behavior: Behavior normal.     Ortho Exam patient has quad weakness on the right I can overcome with a single finger.  She can get down to 90 degrees trying a wall squat and only makes it for about 4 to 6 seconds for she has to stop.  Still has a 7-10 degree flexion contracture.  She flexes past 110 degrees.  Incisions well-healed.  Specialty  Comments:  No specialty comments available.  Imaging: No results found.   PMFS History: Patient Active Problem List   Diagnosis Date Noted  . Quadriceps weakness 03/19/2021  . S/P total knee arthroplasty, right 12/18/2020  . Morbid obesity ([place]) 02/08/2018  . Guaiac positive stools 04/21/2017  . Pancytopenia ([place]) 04/09/2017  . Other cirrhosis of liver ([place]) 04/09/2017  . Acute pancreatitis 04/06/2017  . DM type 2 causing vascular disease ([place]) 04/06/2017  . HTN (hypertension) 04/06/2017  . OSA (obstructive sleep apnea) 04/06/2017  . Asthma in adult 04/06/2017  . Obesity (BMI 30-39.9) 04/06/2017  . Primary osteoarthritis of both knees 10/26/2016  . Primary osteoarthritis of both hands 10/26/2016  . Primary osteoarthritis of both feet 10/26/2016  . COPD (chronic obstructive pulmonary disease) ([place]) 09/27/2016  . Hypothyroidism 09/27/2016  . Vertebral compression fracture ([place]) 09/27/2016  . Fatty  liver 09/27/2016  . Fibromyalgia 09/27/2016  . Restless leg syndrome 09/27/2016  . Lumbar radiculitis 10/01/2014  . Nerve pain 08/30/2014  . Lumbar stenosis with neurogenic claudication 08/28/2014  . Spondylosis of lumbar region without myelopathy or radiculopathy 08/23/2014  . Lumbosacral stenosis with neurogenic claudication ([place]) 08/23/2014  . Vaginal discharge 06/22/2014  . Chest pain 06/21/2014  . Spinal stenosis 08/23/2011  . Obstructive sleep apnea 08/23/2011  . Diabetes ([place]) 08/12/2010  . CAD 08/12/2010  . Chronic diastolic heart failure ([place]) 08/12/2010  . Mixed hyperlipidemia 06/23/2009  . OVERWEIGHT/OBESITY 06/23/2009  . Essential hypertension, benign 06/23/2009  . CHEST PAIN-UNSPECIFIED 06/23/2009   Past Medical History:  Diagnosis Date  . Anemia    PMH: as a child  . Arthritis   . Asthma   . CAD (coronary artery disease)    nonobstructive by cath, 6/08 (false positive Cardiolite) normal stress echo, 7/11  . CHF (congestive heart failure) ([place])   . Chronic back pain   . Chronic bronchitis ([place])   . Cirrhosis ([place])   . Complication of anesthesia    had an asthma attack when woke up from procedure  . Complication of anesthesia    asthma attack with one surgery  . COPD (chronic obstructive pulmonary disease) ([place])   . Degenerative joint disease   . Depression   . Diabetes mellitus without complication ([place])   . DJD (degenerative joint disease)   . Fatty liver   . Fibromyalgia   . GERD (gastroesophageal reflux disease)   . Glaucoma   . Headache(784.0)   . Heart failure, diastolic, chronic ([place])   . Heart murmur    PMH:As a child only  . Hernia of abdominal cavity    "upper and lower hernia"  . History of hiatal hernia   . HTN (hypertension)   . Hypertension   . Hypothyroidism   . IBS (irritable bowel syndrome)   . IDDM (insulin dependent diabetes mellitus)   . Morbid obesity ([place])   . Neuropathy    associated with diabetes  . Obstructive sleep  apnea   . Pancreatitis   . Pinched nerve in neck   . Pneumonia    as a child  . Pulmonary hypertension ([place])   . Rheumatic fever    PMH: as a child  . Seizures ([place])    PMH: only as a child  . Seizures ([place])    in childhood  . Shortness of breath   . Sleep apnea   . Spinal stenosis   . Stroke [facility])    12/09/2013    Family History  Problem Relation Age of Onset  .  Cancer Other   . Heart failure Other   . Colon cancer Brother     Past Surgical History:  Procedure Laterality Date  . ABDOMINAL HYSTERECTOMY    . ABDOMINAL HYSTERECTOMY     partial hysterectomy  . APPENDECTOMY    . BACK SURGERY    . BACK SURGERY     disc surgery with complications and damage to right side  . BILATERAL KNEE ARTHROSCOPY  2000, 2009  . CARDIAC CATHETERIZATION     2009  . CATARACT EXTRACTION W/ INTRAOCULAR LENS  IMPLANT, BILATERAL    . CHOLECYSTECTOMY  1994  . CHOLECYSTECTOMY    . COLONOSCOPY N/A 08/01/2013   Procedure: COLONOSCOPY;  Surgeon: Rogene Houston, MD;  [facility];  Service: Endoscopy;  Laterality: N/A;  930  . COLONOSCOPY N/A 08/03/2017   Procedure: COLONOSCOPY;  Surgeon: Rogene Houston, MD;  [facility];  Service: Endoscopy;  Laterality: N/A;  12:00  . CYST EXCISION     Left breast  . EXCISIONAL HEMORRHOIDECTOMY    . [place]  . EYE SURGERY     bilateral cataract removal with lens implants  . EYE SURGERY     growth removed from right eye lid  . EYE SURGERY     bilateral eye surgery age 41 t"to straighten eyes'  . heel (other)  2005  . HEEL SPUR SURGERY     Right heel - growth removal and rebuilding of heel  . HEMORRHOID SURGERY    . HERNIA REPAIR    . KNEE ARTHROSCOPY Bilateral   . LUMBAR LAMINECTOMY/DECOMPRESSION MICRODISCECTOMY Right 08/23/2014   Procedure: LUMBAR LAMINECTOMY/DECOMPRESSION MICRODISCECTOMY 1 LEVEL;  Surgeon: Charlie Pitter, MD;  Location: [place] NE[facility];  Service: Neurosurgery;  Laterality: Right;  LUMBAR  LAMINECTOMY/DECOMPRESSION MICRODISCECTOMY 1 LEVEL LUMBAR 2-3  . ovaries removed  2003  . PARTIAL HYSTERECTOMY  1981  . SPHINCTEROTOMY    . spinahatomy  1992  . TOTAL KNEE ARTHROPLASTY Right 11/05/2020   Procedure: RIGHT TOTAL KNEE ARTHROPLASTY;  Surgeon: Marybelle Killings, MD;  [facility];  Service: Orthopedics;  Laterality: Right;  . TUBAL LIGATION  1975  . TUBAL LIGATION     Social History   Occupational History  . Not on file  Tobacco Use  . Smoking status: Never Smoker  . Smokeless tobacco: Never Used  Vaping Use  . Vaping Use: Never used  Substance and Sexual Activity  . Alcohol use: No  . Drug use: No  . Sexual activity: Not on file

## 2021-03-19 NOTE — Addendum Note (Signed)
Addended byLaurann Montana on: 03/19/2021 01:48 PM   Modules accepted: Orders

## 2021-03-20 DIAGNOSIS — M25661 Stiffness of right knee, not elsewhere classified: Secondary | ICD-10-CM | POA: Diagnosis not present

## 2021-03-20 DIAGNOSIS — R2689 Other abnormalities of gait and mobility: Secondary | ICD-10-CM | POA: Diagnosis not present

## 2021-03-20 DIAGNOSIS — M6281 Muscle weakness (generalized): Secondary | ICD-10-CM | POA: Diagnosis not present

## 2021-03-20 DIAGNOSIS — Z96651 Presence of right artificial knee joint: Secondary | ICD-10-CM | POA: Diagnosis not present

## 2021-03-23 DIAGNOSIS — J449 Chronic obstructive pulmonary disease, unspecified: Secondary | ICD-10-CM | POA: Diagnosis not present

## 2021-03-24 DIAGNOSIS — H353231 Exudative age-related macular degeneration, bilateral, with active choroidal neovascularization: Secondary | ICD-10-CM | POA: Diagnosis not present

## 2021-03-25 DIAGNOSIS — M25661 Stiffness of right knee, not elsewhere classified: Secondary | ICD-10-CM | POA: Diagnosis not present

## 2021-03-25 DIAGNOSIS — R2689 Other abnormalities of gait and mobility: Secondary | ICD-10-CM | POA: Diagnosis not present

## 2021-03-25 DIAGNOSIS — Z96651 Presence of right artificial knee joint: Secondary | ICD-10-CM | POA: Diagnosis not present

## 2021-03-25 DIAGNOSIS — M6281 Muscle weakness (generalized): Secondary | ICD-10-CM | POA: Diagnosis not present

## 2021-03-26 DIAGNOSIS — M6281 Muscle weakness (generalized): Secondary | ICD-10-CM | POA: Diagnosis not present

## 2021-03-26 DIAGNOSIS — Z96651 Presence of right artificial knee joint: Secondary | ICD-10-CM | POA: Diagnosis not present

## 2021-03-26 DIAGNOSIS — R2689 Other abnormalities of gait and mobility: Secondary | ICD-10-CM | POA: Diagnosis not present

## 2021-03-26 DIAGNOSIS — M25661 Stiffness of right knee, not elsewhere classified: Secondary | ICD-10-CM | POA: Diagnosis not present

## 2021-03-30 DIAGNOSIS — M25661 Stiffness of right knee, not elsewhere classified: Secondary | ICD-10-CM | POA: Diagnosis not present

## 2021-03-30 DIAGNOSIS — Z96651 Presence of right artificial knee joint: Secondary | ICD-10-CM | POA: Diagnosis not present

## 2021-03-30 DIAGNOSIS — R2689 Other abnormalities of gait and mobility: Secondary | ICD-10-CM | POA: Diagnosis not present

## 2021-03-30 DIAGNOSIS — M6281 Muscle weakness (generalized): Secondary | ICD-10-CM | POA: Diagnosis not present

## 2021-04-01 DIAGNOSIS — Z96651 Presence of right artificial knee joint: Secondary | ICD-10-CM | POA: Diagnosis not present

## 2021-04-01 DIAGNOSIS — M6281 Muscle weakness (generalized): Secondary | ICD-10-CM | POA: Diagnosis not present

## 2021-04-01 DIAGNOSIS — M25661 Stiffness of right knee, not elsewhere classified: Secondary | ICD-10-CM | POA: Diagnosis not present

## 2021-04-01 DIAGNOSIS — R2689 Other abnormalities of gait and mobility: Secondary | ICD-10-CM | POA: Diagnosis not present

## 2021-04-03 DIAGNOSIS — R2689 Other abnormalities of gait and mobility: Secondary | ICD-10-CM | POA: Diagnosis not present

## 2021-04-03 DIAGNOSIS — Z96651 Presence of right artificial knee joint: Secondary | ICD-10-CM | POA: Diagnosis not present

## 2021-04-03 DIAGNOSIS — M25661 Stiffness of right knee, not elsewhere classified: Secondary | ICD-10-CM | POA: Diagnosis not present

## 2021-04-03 DIAGNOSIS — M6281 Muscle weakness (generalized): Secondary | ICD-10-CM | POA: Diagnosis not present

## 2021-04-06 DIAGNOSIS — R2689 Other abnormalities of gait and mobility: Secondary | ICD-10-CM | POA: Diagnosis not present

## 2021-04-06 DIAGNOSIS — M6281 Muscle weakness (generalized): Secondary | ICD-10-CM | POA: Diagnosis not present

## 2021-04-06 DIAGNOSIS — Z96651 Presence of right artificial knee joint: Secondary | ICD-10-CM | POA: Diagnosis not present

## 2021-04-06 DIAGNOSIS — M25661 Stiffness of right knee, not elsewhere classified: Secondary | ICD-10-CM | POA: Diagnosis not present

## 2021-04-08 DIAGNOSIS — Z96651 Presence of right artificial knee joint: Secondary | ICD-10-CM | POA: Diagnosis not present

## 2021-04-08 DIAGNOSIS — M25661 Stiffness of right knee, not elsewhere classified: Secondary | ICD-10-CM | POA: Diagnosis not present

## 2021-04-08 DIAGNOSIS — M6281 Muscle weakness (generalized): Secondary | ICD-10-CM | POA: Diagnosis not present

## 2021-04-08 DIAGNOSIS — R2689 Other abnormalities of gait and mobility: Secondary | ICD-10-CM | POA: Diagnosis not present

## 2021-04-10 DIAGNOSIS — M6281 Muscle weakness (generalized): Secondary | ICD-10-CM | POA: Diagnosis not present

## 2021-04-10 DIAGNOSIS — Z96651 Presence of right artificial knee joint: Secondary | ICD-10-CM | POA: Diagnosis not present

## 2021-04-10 DIAGNOSIS — R2689 Other abnormalities of gait and mobility: Secondary | ICD-10-CM | POA: Diagnosis not present

## 2021-04-10 DIAGNOSIS — M25661 Stiffness of right knee, not elsewhere classified: Secondary | ICD-10-CM | POA: Diagnosis not present

## 2021-04-14 DIAGNOSIS — R2689 Other abnormalities of gait and mobility: Secondary | ICD-10-CM | POA: Diagnosis not present

## 2021-04-14 DIAGNOSIS — M6281 Muscle weakness (generalized): Secondary | ICD-10-CM | POA: Diagnosis not present

## 2021-04-14 DIAGNOSIS — Z96651 Presence of right artificial knee joint: Secondary | ICD-10-CM | POA: Diagnosis not present

## 2021-04-14 DIAGNOSIS — M25661 Stiffness of right knee, not elsewhere classified: Secondary | ICD-10-CM | POA: Diagnosis not present

## 2021-04-15 DIAGNOSIS — H524 Presbyopia: Secondary | ICD-10-CM | POA: Diagnosis not present

## 2021-04-15 DIAGNOSIS — H353232 Exudative age-related macular degeneration, bilateral, with inactive choroidal neovascularization: Secondary | ICD-10-CM | POA: Diagnosis not present

## 2021-04-15 DIAGNOSIS — E119 Type 2 diabetes mellitus without complications: Secondary | ICD-10-CM | POA: Diagnosis not present

## 2021-04-15 DIAGNOSIS — H1045 Other chronic allergic conjunctivitis: Secondary | ICD-10-CM | POA: Diagnosis not present

## 2021-04-15 DIAGNOSIS — Z961 Presence of intraocular lens: Secondary | ICD-10-CM | POA: Diagnosis not present

## 2021-04-17 DIAGNOSIS — R2689 Other abnormalities of gait and mobility: Secondary | ICD-10-CM | POA: Diagnosis not present

## 2021-04-17 DIAGNOSIS — M25661 Stiffness of right knee, not elsewhere classified: Secondary | ICD-10-CM | POA: Diagnosis not present

## 2021-04-17 DIAGNOSIS — M6281 Muscle weakness (generalized): Secondary | ICD-10-CM | POA: Diagnosis not present

## 2021-04-17 DIAGNOSIS — Z96651 Presence of right artificial knee joint: Secondary | ICD-10-CM | POA: Diagnosis not present

## 2021-04-21 DIAGNOSIS — H353231 Exudative age-related macular degeneration, bilateral, with active choroidal neovascularization: Secondary | ICD-10-CM | POA: Diagnosis not present

## 2021-04-23 DIAGNOSIS — J449 Chronic obstructive pulmonary disease, unspecified: Secondary | ICD-10-CM | POA: Diagnosis not present

## 2021-04-27 DIAGNOSIS — R2689 Other abnormalities of gait and mobility: Secondary | ICD-10-CM | POA: Diagnosis not present

## 2021-04-27 DIAGNOSIS — Z96651 Presence of right artificial knee joint: Secondary | ICD-10-CM | POA: Diagnosis not present

## 2021-04-27 DIAGNOSIS — M6281 Muscle weakness (generalized): Secondary | ICD-10-CM | POA: Diagnosis not present

## 2021-04-27 DIAGNOSIS — M25661 Stiffness of right knee, not elsewhere classified: Secondary | ICD-10-CM | POA: Diagnosis not present

## 2021-04-28 DIAGNOSIS — H5213 Myopia, bilateral: Secondary | ICD-10-CM | POA: Diagnosis not present

## 2021-04-29 DIAGNOSIS — Z0000 Encounter for general adult medical examination without abnormal findings: Secondary | ICD-10-CM | POA: Diagnosis not present

## 2021-04-29 DIAGNOSIS — E039 Hypothyroidism, unspecified: Secondary | ICD-10-CM | POA: Diagnosis not present

## 2021-04-29 DIAGNOSIS — R5383 Other fatigue: Secondary | ICD-10-CM | POA: Diagnosis not present

## 2021-04-29 DIAGNOSIS — Z299 Encounter for prophylactic measures, unspecified: Secondary | ICD-10-CM | POA: Diagnosis not present

## 2021-04-29 DIAGNOSIS — E7800 Pure hypercholesterolemia, unspecified: Secondary | ICD-10-CM | POA: Diagnosis not present

## 2021-04-29 DIAGNOSIS — Z7189 Other specified counseling: Secondary | ICD-10-CM | POA: Diagnosis not present

## 2021-04-29 DIAGNOSIS — Z79899 Other long term (current) drug therapy: Secondary | ICD-10-CM | POA: Diagnosis not present

## 2021-04-30 DIAGNOSIS — E43 Unspecified severe protein-calorie malnutrition: Secondary | ICD-10-CM | POA: Diagnosis not present

## 2021-04-30 DIAGNOSIS — E569 Vitamin deficiency, unspecified: Secondary | ICD-10-CM | POA: Diagnosis not present

## 2021-05-04 DIAGNOSIS — M6281 Muscle weakness (generalized): Secondary | ICD-10-CM | POA: Diagnosis not present

## 2021-05-04 DIAGNOSIS — R2689 Other abnormalities of gait and mobility: Secondary | ICD-10-CM | POA: Diagnosis not present

## 2021-05-04 DIAGNOSIS — M25661 Stiffness of right knee, not elsewhere classified: Secondary | ICD-10-CM | POA: Diagnosis not present

## 2021-05-04 DIAGNOSIS — Z96651 Presence of right artificial knee joint: Secondary | ICD-10-CM | POA: Diagnosis not present

## 2021-05-06 DIAGNOSIS — R2689 Other abnormalities of gait and mobility: Secondary | ICD-10-CM | POA: Diagnosis not present

## 2021-05-06 DIAGNOSIS — M6281 Muscle weakness (generalized): Secondary | ICD-10-CM | POA: Diagnosis not present

## 2021-05-06 DIAGNOSIS — Z96651 Presence of right artificial knee joint: Secondary | ICD-10-CM | POA: Diagnosis not present

## 2021-05-06 DIAGNOSIS — M25661 Stiffness of right knee, not elsewhere classified: Secondary | ICD-10-CM | POA: Diagnosis not present

## 2021-05-07 DIAGNOSIS — R945 Abnormal results of liver function studies: Secondary | ICD-10-CM | POA: Diagnosis not present

## 2021-05-07 DIAGNOSIS — R7989 Other specified abnormal findings of blood chemistry: Secondary | ICD-10-CM | POA: Diagnosis not present

## 2021-05-07 DIAGNOSIS — K7689 Other specified diseases of liver: Secondary | ICD-10-CM | POA: Diagnosis not present

## 2021-05-07 DIAGNOSIS — R932 Abnormal findings on diagnostic imaging of liver and biliary tract: Secondary | ICD-10-CM | POA: Diagnosis not present

## 2021-05-07 DIAGNOSIS — R161 Splenomegaly, not elsewhere classified: Secondary | ICD-10-CM | POA: Diagnosis not present

## 2021-05-11 DIAGNOSIS — Z96651 Presence of right artificial knee joint: Secondary | ICD-10-CM | POA: Diagnosis not present

## 2021-05-11 DIAGNOSIS — M25661 Stiffness of right knee, not elsewhere classified: Secondary | ICD-10-CM | POA: Diagnosis not present

## 2021-05-11 DIAGNOSIS — M6281 Muscle weakness (generalized): Secondary | ICD-10-CM | POA: Diagnosis not present

## 2021-05-11 DIAGNOSIS — R2689 Other abnormalities of gait and mobility: Secondary | ICD-10-CM | POA: Diagnosis not present

## 2021-05-12 DIAGNOSIS — R079 Chest pain, unspecified: Secondary | ICD-10-CM | POA: Diagnosis not present

## 2021-05-12 DIAGNOSIS — J449 Chronic obstructive pulmonary disease, unspecified: Secondary | ICD-10-CM | POA: Diagnosis not present

## 2021-05-12 DIAGNOSIS — I10 Essential (primary) hypertension: Secondary | ICD-10-CM | POA: Diagnosis not present

## 2021-05-12 DIAGNOSIS — K7460 Unspecified cirrhosis of liver: Secondary | ICD-10-CM | POA: Diagnosis not present

## 2021-05-12 DIAGNOSIS — Z299 Encounter for prophylactic measures, unspecified: Secondary | ICD-10-CM | POA: Diagnosis not present

## 2021-05-13 DIAGNOSIS — Z96651 Presence of right artificial knee joint: Secondary | ICD-10-CM | POA: Diagnosis not present

## 2021-05-13 DIAGNOSIS — R2689 Other abnormalities of gait and mobility: Secondary | ICD-10-CM | POA: Diagnosis not present

## 2021-05-13 DIAGNOSIS — M25661 Stiffness of right knee, not elsewhere classified: Secondary | ICD-10-CM | POA: Diagnosis not present

## 2021-05-13 DIAGNOSIS — M6281 Muscle weakness (generalized): Secondary | ICD-10-CM | POA: Diagnosis not present

## 2021-05-14 ENCOUNTER — Encounter (INDEPENDENT_AMBULATORY_CARE_PROVIDER_SITE_OTHER): Payer: Self-pay | Admitting: *Deleted

## 2021-05-19 DIAGNOSIS — H353231 Exudative age-related macular degeneration, bilateral, with active choroidal neovascularization: Secondary | ICD-10-CM | POA: Diagnosis not present

## 2021-05-19 DIAGNOSIS — E113293 Type 2 diabetes mellitus with mild nonproliferative diabetic retinopathy without macular edema, bilateral: Secondary | ICD-10-CM | POA: Diagnosis not present

## 2021-05-19 DIAGNOSIS — H43823 Vitreomacular adhesion, bilateral: Secondary | ICD-10-CM | POA: Diagnosis not present

## 2021-05-19 DIAGNOSIS — H35371 Puckering of macula, right eye: Secondary | ICD-10-CM | POA: Diagnosis not present

## 2021-05-20 DIAGNOSIS — M810 Age-related osteoporosis without current pathological fracture: Secondary | ICD-10-CM | POA: Diagnosis not present

## 2021-05-21 ENCOUNTER — Ambulatory Visit (INDEPENDENT_AMBULATORY_CARE_PROVIDER_SITE_OTHER): Payer: Medicare Other | Admitting: Orthopaedic Surgery

## 2021-05-21 ENCOUNTER — Encounter: Payer: Self-pay | Admitting: Orthopaedic Surgery

## 2021-05-21 ENCOUNTER — Other Ambulatory Visit: Payer: Self-pay

## 2021-05-21 VITALS — Ht 60.0 in | Wt 130.0 lb

## 2021-05-21 DIAGNOSIS — H52223 Regular astigmatism, bilateral: Secondary | ICD-10-CM | POA: Diagnosis not present

## 2021-05-21 DIAGNOSIS — M6281 Muscle weakness (generalized): Secondary | ICD-10-CM | POA: Diagnosis not present

## 2021-05-21 DIAGNOSIS — H5213 Myopia, bilateral: Secondary | ICD-10-CM | POA: Diagnosis not present

## 2021-05-21 DIAGNOSIS — Z96651 Presence of right artificial knee joint: Secondary | ICD-10-CM

## 2021-05-21 NOTE — Progress Notes (Signed)
Office Visit Note   Patient: Kimberly [name]           Date of Birth: [birth year]           MRN: [id number] Visit Date: 05/21/2021              Requested by: Glenda Chroman, MD [place],  [place] [address] PCP: Glenda Chroman, MD   Assessment & Plan: Visit Diagnoses:  1. S/P total knee arthroplasty, right   2. Quadriceps weakness     Plan: She is finished her therapy she will continue to work on prone positioning increase the weight and get more extension.  Continue work on Forensic scientist we gave her multiple new exercises.  Recheck 3 months.  Follow-Up Instructions: Return in about 3 months (around 08/21/2021).   Orders:  No orders of the defined types were placed in this encounter.  No orders of the defined types were placed in this encounter.     Procedures: No procedures performed   Clinical Data: No additional findings.   Subjective: Chief Complaint  Patient presents with   Right Knee - Follow-up    11/05/2020 Right TKA    HPI follow-up 20 arthroplasty December 2021 with residual flexion contracture originally 10 now after repeat therapy down to 5 degrees.  She can do a wall squat has gotten improvement in her quad but still has some quad weakness.  She is using Tylenol.  States she has some numbness from her knee down.  She has 5 pound ankle weights she has been using in prone positioning but needs increased to 10 to help stretch the posterior capsule and get the last 5 degrees of extension.  Review of Systems updated unchanged.   Objective: Vital Signs: Ht 5' (1.524 m)   Wt 130 lb (59 kg)   BMI 25.39 kg/m   Physical Exam Constitutional:      Appearance: She is well-developed.  HENT:     Head: Normocephalic.     Right Ear: External ear normal.     Left Ear: External ear normal. There is no impacted cerumen.  Eyes:     Pupils: Pupils are equal, round, and reactive to light.  Neck:     Thyroid: No thyromegaly.     Trachea: No tracheal  deviation.  Cardiovascular:     Rate and Rhythm: Normal rate.  Pulmonary:     Effort: Pulmonary effort is normal.  Abdominal:     Palpations: Abdomen is soft.  Musculoskeletal:     Cervical back: No rigidity.  Skin:    General: Skin is warm and dry.  Neurological:     Mental Status: She is alert and oriented to person, place, and time.  Psychiatric:        Behavior: Behavior normal.    Ortho Exam patient can do 15/22 wall squat which previously she could only make it 2 seconds.  She lacks 5 degrees reaching full extension.  No problems with flexion.  Collateral ligaments are stable no knee effusion.  Well-healed midline incision.  Specialty Comments:  No specialty comments available.  Imaging: No results found.   PMFS History: Patient Active Problem List   Diagnosis Date Noted   Quadriceps weakness 03/19/2021   S/P total knee arthroplasty, right 12/18/2020   Morbid obesity ([place]) 02/08/2018   Guaiac positive stools 04/21/2017   Pancytopenia ([place]) 04/09/2017   Other cirrhosis of liver ([place]) 04/09/2017   Acute pancreatitis 04/06/2017   DM type 2  causing vascular disease ([place]) 04/06/2017   HTN (hypertension) 04/06/2017   OSA (obstructive sleep apnea) 04/06/2017   Asthma in adult 04/06/2017   Obesity (BMI 30-39.9) 04/06/2017   Primary osteoarthritis of both knees 10/26/2016   Primary osteoarthritis of both hands 10/26/2016   Primary osteoarthritis of both feet 10/26/2016   COPD (chronic obstructive pulmonary disease) ([place]) 09/27/2016   Hypothyroidism 09/27/2016   Vertebral compression fracture ([place]) 09/27/2016   Fatty liver 09/27/2016   Fibromyalgia 09/27/2016   Restless leg syndrome 09/27/2016   Lumbar radiculitis 10/01/2014   Nerve pain 08/30/2014   Lumbar stenosis with neurogenic claudication 08/28/2014   Spondylosis of lumbar region without myelopathy or radiculopathy 08/23/2014   Lumbosacral stenosis with neurogenic claudication ([place]) 08/23/2014   Vaginal  discharge 06/22/2014   Chest pain 06/21/2014   Spinal stenosis 08/23/2011   Obstructive sleep apnea 08/23/2011   Diabetes ([place]) 08/12/2010   CAD 08/12/2010   Chronic diastolic heart failure ([place]) 08/12/2010   Mixed hyperlipidemia 06/23/2009   OVERWEIGHT/OBESITY 06/23/2009   Essential hypertension, benign 06/23/2009   CHEST PAIN-UNSPECIFIED 06/23/2009   Past Medical History:  Diagnosis Date   Anemia    PMH: as a child   Arthritis    Asthma    CAD (coronary artery disease)    nonobstructive by cath, 6/08 (false positive Cardiolite) normal stress echo, 7/11   CHF (congestive heart failure) (HCC)    Chronic back pain    Chronic bronchitis (HCC)    Cirrhosis (HCC)    Complication of anesthesia    had an asthma attack when woke up from procedure   Complication of anesthesia    asthma attack with one surgery   COPD (chronic obstructive pulmonary disease) ([place])    Degenerative joint disease    Depression    Diabetes mellitus without complication (HCC)    DJD (degenerative joint disease)    Fatty liver    Fibromyalgia    GERD (gastroesophageal reflux disease)    Glaucoma    Headache(784.0)    Heart failure, diastolic, chronic (HCC)    Heart murmur    PMH:As a child only   Hernia of abdominal cavity    "upper and lower hernia"   History of hiatal hernia    HTN (hypertension)    Hypertension    Hypothyroidism    IBS (irritable bowel syndrome)    IDDM (insulin dependent diabetes mellitus)    Morbid obesity ([place])    Neuropathy    associated with diabetes   Obstructive sleep apnea    Pancreatitis    Pinched nerve in neck    Pneumonia    as a child   Pulmonary hypertension ([place])    Rheumatic fever    PMH: as a child   Seizures ([place])    PMH: only as a child   Seizures ([place])    in childhood   Shortness of breath    Sleep apnea    Spinal stenosis    Stroke ([place])    12/09/2013    Family History  Problem Relation Age of Onset   Cancer Other    Heart failure Other     Colon cancer Brother     Past Surgical History:  Procedure Laterality Date   ABDOMINAL HYSTERECTOMY     ABDOMINAL HYSTERECTOMY     partial hysterectomy   APPENDECTOMY     BACK SURGERY     BACK SURGERY     disc surgery with complications and damage to right side   BILATERAL  KNEE ARTHROSCOPY  2000, 2009   CARDIAC CATHETERIZATION     2009   CATARACT EXTRACTION W/ INTRAOCULAR LENS  IMPLANT, BILATERAL     CHOLECYSTECTOMY  1994   CHOLECYSTECTOMY     COLONOSCOPY N/A 08/01/2013   Procedure: COLONOSCOPY;  Surgeon: Rogene Houston, MD;  [facility];  Service: Endoscopy;  Laterality: N/A;  930   COLONOSCOPY N/A 08/03/2017   Procedure: COLONOSCOPY;  Surgeon: Rogene Houston, MD;  [facility];  Service: Endoscopy;  Laterality: N/A;  12:00   CYST EXCISION     Left breast   EXCISIONAL HEMORRHOIDECTOMY     EYE SURGERY  1967   EYE SURGERY     bilateral cataract removal with lens implants   EYE SURGERY     growth removed from right eye lid   EYE SURGERY     bilateral eye surgery age 33 t"to straighten eyes'   heel (other)  2005   HEEL SPUR SURGERY     Right heel - growth removal and rebuilding of heel   HEMORRHOID SURGERY     HERNIA REPAIR     KNEE ARTHROSCOPY Bilateral    LUMBAR LAMINECTOMY/DECOMPRESSION MICRODISCECTOMY Right 08/23/2014   Procedure: LUMBAR LAMINECTOMY/DECOMPRESSION MICRODISCECTOMY 1 LEVEL;  Surgeon: Charlie Pitter, MD;  Location: [facility];  Service: Neurosurgery;  Laterality: Right;  LUMBAR LAMINECTOMY/DECOMPRESSION MICRODISCECTOMY 1 LEVEL LUMBAR 2-3   ovaries removed  2003   PARTIAL HYSTERECTOMY  1981   SPHINCTEROTOMY     spinahatomy  1992   TOTAL KNEE ARTHROPLASTY Right 11/05/2020   Procedure: RIGHT TOTAL KNEE ARTHROPLASTY;  Surgeon: Marybelle Killings, MD;  Location: [facility];  Service: Orthopedics;  Laterality: Right;   TUBAL LIGATION  1975   TUBAL LIGATION     Social History   Occupational History   Not on file  Tobacco Use   Smoking  status: Never   Smokeless tobacco: Never  Vaping Use   Vaping Use: Never used  Substance and Sexual Activity   Alcohol use: No   Drug use: No   Sexual activity: Not on file

## 2021-05-23 DIAGNOSIS — J449 Chronic obstructive pulmonary disease, unspecified: Secondary | ICD-10-CM | POA: Diagnosis not present

## 2021-06-08 ENCOUNTER — Encounter (INDEPENDENT_AMBULATORY_CARE_PROVIDER_SITE_OTHER): Payer: Self-pay | Admitting: Gastroenterology

## 2021-06-08 ENCOUNTER — Encounter (INDEPENDENT_AMBULATORY_CARE_PROVIDER_SITE_OTHER): Payer: Self-pay

## 2021-06-08 ENCOUNTER — Ambulatory Visit (INDEPENDENT_AMBULATORY_CARE_PROVIDER_SITE_OTHER): Payer: Medicare Other | Admitting: Gastroenterology

## 2021-06-08 ENCOUNTER — Other Ambulatory Visit (INDEPENDENT_AMBULATORY_CARE_PROVIDER_SITE_OTHER): Payer: Self-pay

## 2021-06-08 ENCOUNTER — Other Ambulatory Visit: Payer: Self-pay

## 2021-06-08 VITALS — BP 117/78 | HR 69 | Temp 98.5°F | Ht 60.0 in | Wt 132.8 lb

## 2021-06-08 DIAGNOSIS — K7469 Other cirrhosis of liver: Secondary | ICD-10-CM

## 2021-06-08 DIAGNOSIS — K580 Irritable bowel syndrome with diarrhea: Secondary | ICD-10-CM

## 2021-06-08 DIAGNOSIS — K529 Noninfective gastroenteritis and colitis, unspecified: Secondary | ICD-10-CM

## 2021-06-08 DIAGNOSIS — K589 Irritable bowel syndrome without diarrhea: Secondary | ICD-10-CM | POA: Insufficient documentation

## 2021-06-08 DIAGNOSIS — Z1159 Encounter for screening for other viral diseases: Secondary | ICD-10-CM

## 2021-06-08 MED ORDER — HYOSCYAMINE SULFATE 0.125 MG PO TABS: 0.1250 mg | ORAL_TABLET | Freq: Three times a day (TID) | ORAL | 5 refills | Status: DC | PRN

## 2021-06-08 NOTE — Progress Notes (Signed)
Maylon Peppers, M.D. Gastroenterology & Hepatology [facility] [address] [place], [place] [address]  Primary Care Physician: [place] [id number]  I will communicate my assessment and recommendations to the referring MD via EMR.  Problems: IBS-D Compensated NASH cirrhosis History of obesity status post duodenal switch Family history of colon cancer (multiple family members).  History of Present Illness: ONDREA DOW is a 65 y.o. female with past medical history of compensated NASH cirrhosis, CHF, coronary artery disease, depression, diabetes, COPD, IBS-D, hypothyroidism, hypertension, OSA, stroke, seizures, obesity status post duodenal switch, who presents for evaluation of multiple complaints including loose bowel movements, abdominal bloating, pain.  The patient was last seen on 11/29/2019. At that time, the patient will be evaluated for new onset of diarrhea during the night.  She was ordered stool testing including C. difficile and GI pathogen panel but no studies were performed.  Patient reports that after she underwent her duodenal switch surgery in 2020 at [facility] she has presented worsening episodes of nausea with vomiting after eating, as well as episodes of diarrhea with any food intake. She states she has usually loose stools after she has any type of food or liquids which has led to significant discomfort. It is difficult for her to tell how many bowel movements she is having every day. She has frequent accidents every day, which never happened prior to her bowel surgery. The patient reports that even before having the duodenal switch, even as a child she had issues with diarrhea and nausea. She reported that she used to have diarrhea and constipation intermittently but never had symptoms during the night or fecal soiling. Has not tried any antidiarrheals in the past as she is afraid  of getting constipated. States the constant diarrhea is happening for the last 6 months as constipation is now rare.  She is scheduled to have a breath test for SIBO at [facility] as scheduled by her bariatric surgeon.  Has presented intermittent episodes of abdominal cramping that have not improved with the use of Bentyl in the past.  She reports that she has been on Lasix and spironolactone for possible ascites, which she believes has helped her decrease her abdominal distention.  She reports that she was told in the past that she did not have cirrhosis but had some degree of fibrosis.  Her most recent elastography last year showed some F2 fibrosis but no other changes. Notably, on her most recent blood testing from April 29, 2024 showed platelet count of 110, MCV of 102, hemoglobin of 12, although cell count of 3.6, CMP with AST of 69, ALT of 39, alkaline phosphatase of 108, total bilirubin 1.4, creatinine 1.06, sodium 141, BUN 17.  She also had a liver ultrasound performed on 05/07/2021 that showed an elevated liver but no mass, there is presence of splenomegaly with torturous splenic hilar vessels related to splenorenal shunt.    The patient denies having any fever, chills, hematochezia, melena, hematemesis, jaundice, pruritus or weight loss.  Brothers x2 and nephew had colon cancer. Brother and sister liver cancer.  Last EGD: 2019, had presence of gastritis.  Had negative H. pylori breath test in 2019 Last Colonoscopy: 2018  - Diverticulosis at the hepatic flexure. - Internal hemorrhoids. - No specimens collected.  Past Medical History: Past Medical History:  Diagnosis Date   Anemia    PMH: as a child   Arthritis  Asthma    CAD (coronary artery disease)    nonobstructive by cath, 6/08 (false positive Cardiolite) normal stress echo, 7/11   CHF (congestive heart failure) (HCC)    Chronic back pain    Chronic bronchitis ([place])    Cirrhosis ([place])    Complication of anesthesia    had  an asthma attack when woke up from procedure   Complication of anesthesia    asthma attack with one surgery   COPD (chronic obstructive pulmonary disease) ([place])    Degenerative joint disease    Depression    Diabetes mellitus without complication ([place])    DJD (degenerative joint disease)    Fatty liver    Fibromyalgia    GERD (gastroesophageal reflux disease)    Glaucoma    Headache(784.0)    Heart failure, diastolic, chronic (HCC)    Heart murmur    PMH:As a child only   Hernia of abdominal cavity    "upper and lower hernia"   History of hiatal hernia    HTN (hypertension)    Hypertension    Hypothyroidism    IBS (irritable bowel syndrome)    IDDM (insulin dependent diabetes mellitus)    Morbid obesity ([place])    Neuropathy    associated with diabetes   Obstructive sleep apnea    Pancreatitis    Pinched nerve in neck    Pneumonia    as a child   Pulmonary hypertension ([place])    Rheumatic fever    PMH: as a child   Seizures ([place])    PMH: only as a child   Seizures ([place])    in childhood   Shortness of breath    Sleep apnea    Spinal stenosis    Stroke ([place])    12/09/2013    Past Surgical History: Past Surgical History:  Procedure Laterality Date   ABDOMINAL HYSTERECTOMY     ABDOMINAL HYSTERECTOMY     partial hysterectomy   APPENDECTOMY     BACK SURGERY     BACK SURGERY     disc surgery with complications and damage to right side   BILATERAL KNEE ARTHROSCOPY  2000, 2009   CARDIAC CATHETERIZATION     2009   CATARACT EXTRACTION W/ INTRAOCULAR LENS  IMPLANT, BILATERAL     CHOLECYSTECTOMY  1994   CHOLECYSTECTOMY     COLONOSCOPY N/A 08/01/2013   Procedure: COLONOSCOPY;  Surgeon: Rogene Houston, MD;  [facility];  Service: Endoscopy;  Laterality: N/A;  930   COLONOSCOPY N/A 08/03/2017   Procedure: COLONOSCOPY;  Surgeon: Rogene Houston, MD;  [facility];  Service: Endoscopy;  Laterality: N/A;  12:00   CYST EXCISION     Left breast    EXCISIONAL HEMORRHOIDECTOMY     EYE SURGERY  1967   EYE SURGERY     bilateral cataract removal with lens implants   EYE SURGERY     growth removed from right eye lid   EYE SURGERY     bilateral eye surgery age 30 t"to straighten eyes'   heel (other)  2005   HEEL SPUR SURGERY     Right heel - growth removal and rebuilding of heel   HEMORRHOID SURGERY     HERNIA REPAIR     KNEE ARTHROSCOPY Bilateral    LUMBAR LAMINECTOMY/DECOMPRESSION MICRODISCECTOMY Right 08/23/2014   Procedure: LUMBAR LAMINECTOMY/DECOMPRESSION MICRODISCECTOMY 1 LEVEL;  Surgeon: Charlie Pitter, MD;  Location: [facility];  Service: Neurosurgery;  Laterality: Right;  LUMBAR LAMINECTOMY/DECOMPRESSION MICRODISCECTOMY 1 LEVEL LUMBAR 2-3   ovaries removed  2003   PARTIAL HYSTERECTOMY  1981   SPHINCTEROTOMY     spinahatomy  1992   TOTAL KNEE ARTHROPLASTY Right 11/05/2020   Procedure: RIGHT TOTAL KNEE ARTHROPLASTY;  Surgeon: Marybelle Killings, MD;  [facility];  Service: Orthopedics;  Laterality: Right;   TUBAL LIGATION  1975   TUBAL LIGATION      Family History: Family History  Problem Relation Age of Onset   Cancer Other    Heart failure Other    Colon cancer Brother     Social History: Social History   Tobacco Use  Smoking Status Never  Smokeless Tobacco Never   Social History   Substance and Sexual Activity  Alcohol Use No   Social History   Substance and Sexual Activity  Drug Use No    Allergies: Allergies  Allergen Reactions   Cinnamon Anaphylaxis   Hydrocodone Other (See Comments)    Over sedation   Naproxen Anaphylaxis and Other (See Comments)    Throat swelling   Other Shortness Of Breath and Other (See Comments)    Local anesthesia Had asthma attack   Other Shortness Of Breath and Other (See Comments)    Pt states a sedative agent used prior to a surgical procedure ( Pt states it "sounds like Phenergan. It should be in my records from [place].") caused an asthma attack.    Oxycodone  Other (See Comments)    Over sedation   Feldene [Piroxicam] Nausea Only   Lyrica [Pregabalin] Other (See Comments)    Altered mental status for prolonged period   Nsaids     Avoid due to history of gastic bypass   Phenergan [Promethazine Hcl] Other (See Comments)    triggers asthma attacks   Sulfa Antibiotics Other (See Comments)    (Yeast infection, per patient.)   Latex Rash and Other (See Comments)    "Blistery" rash.    Tape Rash and Other (See Comments)    Do not use adhesive tape; okay to use paper tape.    Medications: Current Outpatient Medications  Medication Sig Dispense Refill   albuterol (PROVENTIL) (2.5 MG/3ML) 0.083% nebulizer solution Inhale 2.5 mg into the lungs every 6 (six) hours as needed for wheezing or shortness of breath.      albuterol (VENTOLIN HFA) 108 (90 Base) MCG/ACT inhaler Inhale 2 puffs into the lungs 4 (four) times daily as needed for shortness of breath.     atorvastatin (LIPITOR) 10 MG tablet Take 10 mg by mouth daily.     BREO ELLIPTA 200-25 MCG/INH AEPB Inhale 1 puff into the lungs daily.     Calcium Carb-Cholecalciferol (CALCIUM 500+D PO) Take 1 tablet by mouth in the morning, at noon, and at bedtime. Chewable     Cyanocobalamin (VITAMIN B-12) 5000 MCG SUBL Place 5,000 mcg under the tongue every other day.     famotidine (PEPCID) 40 MG tablet Take 40 mg by mouth every morning.     ferrous sulfate 324 MG TBEC Take 324 mg by mouth daily with breakfast.     furosemide (LASIX) 20 MG tablet Take 20-40 mg by mouth daily.     levothyroxine (SYNTHROID) 112 MCG tablet Take 112 mcg by mouth daily before breakfast.     Multiple Vitamin (MULTIVITAMIN PO) Take by mouth 3 (three) times daily. ADEK vitamin chewable     OVER THE COUNTER MEDICATION Ocuvite one daily Vit c 3 gummies a day  pantoprazole (PROTONIX) 40 MG tablet Take 40 mg by mouth daily.     spironolactone (ALDACTONE) 25 MG tablet Take 25 mg by mouth daily.     Zinc 15 MG CAPS Take 15 mg by  mouth daily.     enoxaparin (LOVENOX) 30 MG/0.3ML injection Inject 0.3 mLs (30 mg total) into the skin every 12 (twelve) hours for 21 days. Must use x 3 weeks for postop DVT prophylaxis 12.6 mL 0   No current facility-administered medications for this visit.    Review of Systems: GENERAL: negative for malaise, night sweats HEENT: No changes in hearing or vision, no nose bleeds or other nasal problems. NECK: Negative for lumps, goiter, pain and significant neck swelling RESPIRATORY: Negative for cough, wheezing CARDIOVASCULAR: Negative for chest pain, leg swelling, palpitations, orthopnea GI: SEE HPI MUSCULOSKELETAL: Negative for joint pain or swelling, back pain, and muscle pain. SKIN: Negative for lesions, rash PSYCH: Negative for sleep disturbance, mood disorder and recent psychosocial stressors. HEMATOLOGY Negative for prolonged bleeding, bruising easily, and swollen nodes. ENDOCRINE: Negative for cold or heat intolerance, polyuria, polydipsia and goiter. NEURO: negative for tremor, gait imbalance, syncope and seizures. The remainder of the review of systems is noncontributory.   Physical Exam: BP 117/78 (BP Location: Left Arm, Patient Position: Sitting, Cuff Size: Normal)   Pulse 69   Temp 98.5 F (36.9 C) (Oral)   Ht 5' (1.524 m)   Wt 132 lb 12.8 oz (60.2 kg)   BMI 25.94 kg/m  GENERAL: The patient is AO x3, in no acute distress. HEENT: Head is normocephalic and atraumatic. EOMI are intact. Mouth is well hydrated and without lesions. NECK: Supple. No masses LUNGS: Clear to auscultation. No presence of rhonchi/wheezing/rales. Adequate chest expansion HEART: RRR, normal s1 and s2. ABDOMEN: Soft, nontender, no guarding, no peritoneal signs. Very mild distention. BS +. No masses. EXTREMITIES: Without any cyanosis, clubbing, rash, lesions. Has +1 pitting edema bilaterally up to both knees. NEUROLOGIC: AOx3, no focal motor deficit. No asterixis. SKIN: no jaundice, no  rashes  Imaging/Labs: as above  I personally reviewed and interpreted the available labs, imaging and endoscopic files.  Impression and Plan: MAYSUN MEDITZ is a 65 y.o. female with past medical history of compensated NASH cirrhosis, CHF, coronary artery disease, depression, diabetes, COPD, IBS-D, hypothyroidism, hypertension, OSA, stroke, seizures, obesity status post duodenal switch, who presents for evaluation of multiple complaints including loose bowel movements, abdominal bloating, pain.  In terms of her multiple GI complaints, I discussed with the patient that there is a high possibility that her symptoms are a combination of her underlying irritable bowel syndrome, along with the effects of her duodenal switch which usually lead to similar complaints of the when she is presenting.  Specifically, she presented worsening of her symptoms after she underwent the surgery which increases the likelihood this is the situation.  I do not believe that she would benefit from further evaluation with breath test for SIBO as this could explain part of her symptoms.  If she does not improve, we can consider giving her Xifaxan trial for 2 weeks for IBS-D, as this may lead to improvement of her symptomatology.  She will need to follow with her bariatric surgeon in terms of all the symptoms she is presenting as well.  For now, she can try taking Imodium as needed if she presents any episodes of fecal soiling frequently or large amount of diarrhea (more than 4 bowel movements per day).  Finally, she may have symptom relief  of her abdominal pain with Levsin as needed.  On another hand, she had presence of changes in her imaging that were suggestive of cirrhosis in the past.  There was previous investigation with an elastography that was not supportive of this but the patient has presented possible ascites which has been managed with diuretics.  Given her splenomegaly and thrombocytopenia with mild elevation of the,  I do believe she has early cirrhosis.  I explained to her that in order to confirm his diagnosis we will need to proceed with a liver biopsy which may be too aggressive for her.  We will need to evaluate for other causes of cirrhosis besides NASH with blood testing today.  We will also check AFP.  As her platelet count is less than 150, we will schedule an EGD for screening for esophageal varices.  Patient understood and agreed.  -Schedule EGD -Check MELD labs, AFP, hepatitis A/B/C serologies, iron panel, ANA, AMA, ASMA, IgG -Can take Imodium as needed half a tablet for episodes of diarrhea -Start Levsin 1 tablet q8h as needed for abdominal pain -Follow up with your bariatric surgeon -Follow up with breath test at [facility] - Reduce salt intake to <2 g per day - Can take Tylenol max of 2 g per day (650 mg q8h) for pain - Avoid NSAIDs for pain - Avoid eating raw oysters or shellfish - Ensure every night before going to sleep - RTC 6 months  All questions were answered.      Harvel Quale, MD Gastroenterology and Hepatology [facility]

## 2021-06-08 NOTE — H&P (View-Only) (Signed)
Kimberly [name], M.D. Gastroenterology & Hepatology [facility] [address] [place], [place] [address]  Primary Care Physician: Glenda Chroman, MD [place] [address]  I will communicate my assessment and recommendations to the referring MD via EMR.  Problems: IBS-D Compensated NASH cirrhosis History of obesity status post duodenal switch Family history of colon cancer (multiple family members).  History of Present Illness: ASHELYN [name] is a 65 y.o. female with past medical history of compensated NASH cirrhosis, CHF, coronary artery disease, depression, diabetes, COPD, IBS-D, hypothyroidism, hypertension, OSA, stroke, seizures, obesity status post duodenal switch, who presents for evaluation of multiple complaints including loose bowel movements, abdominal bloating, pain.  The patient was last seen on 11/29/2019. At that time, the patient will be evaluated for new onset of diarrhea during the night.  She was ordered stool testing including C. difficile and GI pathogen panel but no studies were performed.  Patient reports that after she underwent her duodenal switch surgery in 2020 at [place] she has presented worsening episodes of nausea with vomiting after eating, as well as episodes of diarrhea with any food intake. She states she has usually loose stools after she has any type of food or liquids which has led to significant discomfort. It is difficult for her to tell how many bowel movements she is having every day. She has frequent accidents every day, which never happened prior to her bowel surgery. The patient reports that even before having the duodenal switch, even as a child she had issues with diarrhea and nausea. She reported that she used to have diarrhea and constipation intermittently but never had symptoms during the night or fecal soiling. Has not tried any antidiarrheals in the past as she is afraid  of getting constipated. States the constant diarrhea is happening for the last 6 months as constipation is now rare.  She is scheduled to have a breath test for SIBO at [facility] as scheduled by her bariatric surgeon.  Has presented intermittent episodes of abdominal cramping that have not improved with the use of Bentyl in the past.  She reports that she has been on Lasix and spironolactone for possible ascites, which she believes has helped her decrease her abdominal distention.  She reports that she was told in the past that she did not have cirrhosis but had some degree of fibrosis.  Her most recent elastography last year showed some F2 fibrosis but no other changes. Notably, on her most recent blood testing from April 29, 2024 showed platelet count of 110, MCV of 102, hemoglobin of 12, although cell count of 3.6, CMP with AST of 69, ALT of 39, alkaline phosphatase of 108, total bilirubin 1.4, creatinine 1.06, sodium 141, BUN 17.  She also had a liver ultrasound performed on 05/07/2021 that showed an elevated liver but no mass, there is presence of splenomegaly with torturous splenic hilar vessels related to splenorenal shunt.    The patient denies having any fever, chills, hematochezia, melena, hematemesis, jaundice, pruritus or weight loss.  Brothers x2 and nephew had colon cancer. Brother and sister liver cancer.  Last EGD: 2019, had presence of gastritis.  Had negative H. pylori breath test in 2019 Last Colonoscopy: 2018  - Diverticulosis at the hepatic flexure. - Internal hemorrhoids. - No specimens collected.  Past Medical History: Past Medical History:  Diagnosis Date   Anemia    PMH: as a child   Arthritis  Asthma    CAD (coronary artery disease)    nonobstructive by cath, 6/08 (false positive Cardiolite) normal stress echo, 7/11   CHF (congestive heart failure) (HCC)    Chronic back pain    Chronic bronchitis ([place])    Cirrhosis ([place])    Complication of anesthesia    had  an asthma attack when woke up from procedure   Complication of anesthesia    asthma attack with one surgery   COPD (chronic obstructive pulmonary disease) ([place])    Degenerative joint disease    Depression    Diabetes mellitus without complication ([place])    DJD (degenerative joint disease)    Fatty liver    Fibromyalgia    GERD (gastroesophageal reflux disease)    Glaucoma    Headache(784.0)    Heart failure, diastolic, chronic (HCC)    Heart murmur    PMH:As a child only   Hernia of abdominal cavity    "upper and lower hernia"   History of hiatal hernia    HTN (hypertension)    Hypertension    Hypothyroidism    IBS (irritable bowel syndrome)    IDDM (insulin dependent diabetes mellitus)    Morbid obesity ([place])    Neuropathy    associated with diabetes   Obstructive sleep apnea    Pancreatitis    Pinched nerve in neck    Pneumonia    as a child   Pulmonary hypertension ([place])    Rheumatic fever    PMH: as a child   Seizures ([place])    PMH: only as a child   Seizures ([place])    in childhood   Shortness of breath    Sleep apnea    Spinal stenosis    Stroke ([place])    12/09/2013    Past Surgical History: Past Surgical History:  Procedure Laterality Date   ABDOMINAL HYSTERECTOMY     ABDOMINAL HYSTERECTOMY     partial hysterectomy   APPENDECTOMY     BACK SURGERY     BACK SURGERY     disc surgery with complications and damage to right side   BILATERAL KNEE ARTHROSCOPY  2000, 2009   CARDIAC CATHETERIZATION     2009   CATARACT EXTRACTION W/ INTRAOCULAR LENS  IMPLANT, BILATERAL     CHOLECYSTECTOMY  1994   CHOLECYSTECTOMY     COLONOSCOPY N/A 08/01/2013   Procedure: COLONOSCOPY;  Surgeon: Rogene Houston, MD;  Location: [facility];  Service: Endoscopy;  Laterality: N/A;  930   COLONOSCOPY N/A 08/03/2017   Procedure: COLONOSCOPY;  Surgeon: Rogene Houston, MD;  Location: [facility];  Service: Endoscopy;  Laterality: N/A;  12:00   CYST EXCISION     Left breast    EXCISIONAL HEMORRHOIDECTOMY     EYE SURGERY  1967   EYE SURGERY     bilateral cataract removal with lens implants   EYE SURGERY     growth removed from right eye lid   EYE SURGERY     bilateral eye surgery age 89 t"to straighten eyes'   heel (other)  2005   HEEL SPUR SURGERY     Right heel - growth removal and rebuilding of heel   HEMORRHOID SURGERY     HERNIA REPAIR     KNEE ARTHROSCOPY Bilateral    LUMBAR LAMINECTOMY/DECOMPRESSION MICRODISCECTOMY Right 08/23/2014   Procedure: LUMBAR LAMINECTOMY/DECOMPRESSION MICRODISCECTOMY 1 LEVEL;  Surgeon: Charlie Pitter, MD;  Location: [facility];  Service: Neurosurgery;  Laterality: Right;  LUMBAR LAMINECTOMY/DECOMPRESSION MICRODISCECTOMY 1 LEVEL LUMBAR 2-3   ovaries removed  2003   PARTIAL HYSTERECTOMY  1981   SPHINCTEROTOMY     spinahatomy  1992   TOTAL KNEE ARTHROPLASTY Right 11/05/2020   Procedure: RIGHT TOTAL KNEE ARTHROPLASTY;  Surgeon: Marybelle Killings, MD;  Location: [facility] ORS;  Service: Orthopedics;  Laterality: Right;   TUBAL LIGATION  1975   TUBAL LIGATION      Family History: Family History  Problem Relation Age of Onset   Cancer Other    Heart failure Other    Colon cancer Brother     Social History: Social History   Tobacco Use  Smoking Status Never  Smokeless Tobacco Never   Social History   Substance and Sexual Activity  Alcohol Use No   Social History   Substance and Sexual Activity  Drug Use No    Allergies: Allergies  Allergen Reactions   Cinnamon Anaphylaxis   Hydrocodone Other (See Comments)    Over sedation   Naproxen Anaphylaxis and Other (See Comments)    Throat swelling   Other Shortness Of Breath and Other (See Comments)    Local anesthesia Had asthma attack   Other Shortness Of Breath and Other (See Comments)    Pt states a sedative agent used prior to a surgical procedure ( Pt states it "sounds like Phenergan. It should be in my records from [facility].") caused an asthma attack.    Oxycodone  Other (See Comments)    Over sedation   Feldene [Piroxicam] Nausea Only   Lyrica [Pregabalin] Other (See Comments)    Altered mental status for prolonged period   Nsaids     Avoid due to history of gastic bypass   Phenergan [Promethazine Hcl] Other (See Comments)    triggers asthma attacks   Sulfa Antibiotics Other (See Comments)    (Yeast infection, per patient.)   Latex Rash and Other (See Comments)    "Blistery" rash.    Tape Rash and Other (See Comments)    Do not use adhesive tape; okay to use paper tape.    Medications: Current Outpatient Medications  Medication Sig Dispense Refill   albuterol (PROVENTIL) (2.5 MG/3ML) 0.083% nebulizer solution Inhale 2.5 mg into the lungs every 6 (six) hours as needed for wheezing or shortness of breath.      albuterol (VENTOLIN HFA) 108 (90 Base) MCG/ACT inhaler Inhale 2 puffs into the lungs 4 (four) times daily as needed for shortness of breath.     atorvastatin (LIPITOR) 10 MG tablet Take 10 mg by mouth daily.     BREO ELLIPTA 200-25 MCG/INH AEPB Inhale 1 puff into the lungs daily.     Calcium Carb-Cholecalciferol (CALCIUM 500+D PO) Take 1 tablet by mouth in the morning, at noon, and at bedtime. Chewable     Cyanocobalamin (VITAMIN B-12) 5000 MCG SUBL Place 5,000 mcg under the tongue every other day.     famotidine (PEPCID) 40 MG tablet Take 40 mg by mouth every morning.     ferrous sulfate 324 MG TBEC Take 324 mg by mouth daily with breakfast.     furosemide (LASIX) 20 MG tablet Take 20-40 mg by mouth daily.     levothyroxine (SYNTHROID) 112 MCG tablet Take 112 mcg by mouth daily before breakfast.     Multiple Vitamin (MULTIVITAMIN PO) Take by mouth 3 (three) times daily. ADEK vitamin chewable     OVER THE COUNTER MEDICATION Ocuvite one daily Vit c 3 gummies a day  pantoprazole (PROTONIX) 40 MG tablet Take 40 mg by mouth daily.     spironolactone (ALDACTONE) 25 MG tablet Take 25 mg by mouth daily.     Zinc 15 MG CAPS Take 15 mg by  mouth daily.     enoxaparin (LOVENOX) 30 MG/0.3ML injection Inject 0.3 mLs (30 mg total) into the skin every 12 (twelve) hours for 21 days. Must use x 3 weeks for postop DVT prophylaxis 12.6 mL 0   No current facility-administered medications for this visit.    Review of Systems: GENERAL: negative for malaise, night sweats HEENT: No changes in hearing or vision, no nose bleeds or other nasal problems. NECK: Negative for lumps, goiter, pain and significant neck swelling RESPIRATORY: Negative for cough, wheezing CARDIOVASCULAR: Negative for chest pain, leg swelling, palpitations, orthopnea GI: SEE HPI MUSCULOSKELETAL: Negative for joint pain or swelling, back pain, and muscle pain. SKIN: Negative for lesions, rash PSYCH: Negative for sleep disturbance, mood disorder and recent psychosocial stressors. HEMATOLOGY Negative for prolonged bleeding, bruising easily, and swollen nodes. ENDOCRINE: Negative for cold or heat intolerance, polyuria, polydipsia and goiter. NEURO: negative for tremor, gait imbalance, syncope and seizures. The remainder of the review of systems is noncontributory.   Physical Exam: BP 117/78 (BP Location: Left Arm, Patient Position: Sitting, Cuff Size: Normal)   Pulse 69   Temp 98.5 F (36.9 C) (Oral)   Ht 5' (1.524 m)   Wt 132 lb 12.8 oz (60.2 kg)   BMI 25.94 kg/m  GENERAL: The patient is AO x3, in no acute distress. HEENT: Head is normocephalic and atraumatic. EOMI are intact. Mouth is well hydrated and without lesions. NECK: Supple. No masses LUNGS: Clear to auscultation. No presence of rhonchi/wheezing/rales. Adequate chest expansion HEART: RRR, normal s1 and s2. ABDOMEN: Soft, nontender, no guarding, no peritoneal signs. Very mild distention. BS +. No masses. EXTREMITIES: Without any cyanosis, clubbing, rash, lesions. Has +1 pitting edema bilaterally up to both knees. NEUROLOGIC: AOx3, no focal motor deficit. No asterixis. SKIN: no jaundice, no  rashes  Imaging/Labs: as above  I personally reviewed and interpreted the available labs, imaging and endoscopic files.  Impression and Plan: Kimberly [name] is a 65 y.o. female with past medical history of compensated NASH cirrhosis, CHF, coronary artery disease, depression, diabetes, COPD, IBS-D, hypothyroidism, hypertension, OSA, stroke, seizures, obesity status post duodenal switch, who presents for evaluation of multiple complaints including loose bowel movements, abdominal bloating, pain.  In terms of her multiple GI complaints, I discussed with the patient that there is a high possibility that her symptoms are a combination of her underlying irritable bowel syndrome, along with the effects of her duodenal switch which usually lead to similar complaints of the when she is presenting.  Specifically, she presented worsening of her symptoms after she underwent the surgery which increases the likelihood this is the situation.  I do not believe that she would benefit from further evaluation with breath test for SIBO as this could explain part of her symptoms.  If she does not improve, we can consider giving her Xifaxan trial for 2 weeks for IBS-D, as this may lead to improvement of her symptomatology.  She will need to follow with her bariatric surgeon in terms of all the symptoms she is presenting as well.  For now, she can try taking Imodium as needed if she presents any episodes of fecal soiling frequently or large amount of diarrhea (more than 4 bowel movements per day).  Finally, she may have symptom relief  of her abdominal pain with Levsin as needed.  On another hand, she had presence of changes in her imaging that were suggestive of cirrhosis in the past.  There was previous investigation with an elastography that was not supportive of this but the patient has presented possible ascites which has been managed with diuretics.  Given her splenomegaly and thrombocytopenia with mild elevation of the,  I do believe she has early cirrhosis.  I explained to her that in order to confirm his diagnosis we will need to proceed with a liver biopsy which may be too aggressive for her.  We will need to evaluate for other causes of cirrhosis besides NASH with blood testing today.  We will also check AFP.  As her platelet count is less than 150, we will schedule an EGD for screening for esophageal varices.  Patient understood and agreed.  -Schedule EGD -Check MELD labs, AFP, hepatitis A/B/C serologies, iron panel, ANA, AMA, ASMA, IgG -Can take Imodium as needed half a tablet for episodes of diarrhea -Start Levsin 1 tablet q8h as needed for abdominal pain -Follow up with your bariatric surgeon -Follow up with breath test at [facility] - Reduce salt intake to <2 g per day - Can take Tylenol max of 2 g per day (650 mg q8h) for pain - Avoid NSAIDs for pain - Avoid eating raw oysters or shellfish - Ensure every night before going to sleep - RTC 6 months  All questions were answered.      Harvel Quale, MD Gastroenterology and Hepatology [facility]

## 2021-06-08 NOTE — Patient Instructions (Addendum)
Schedule EGD Perform blood workup Can take Imodium as needed half a tablet for episodes of diarrhea Start Levsin 1 tablet q8h as needed for abdominal pain Follow up with your bariatric surgeon Follow up with breath test at [facility] - Reduce salt intake to <2 g per day - Can take Tylenol max of 2 g per day (650 mg q8h) for pain - Avoid NSAIDs for pain - Avoid eating raw oysters or shellfish - Ensure every night before going to sleep

## 2021-06-09 ENCOUNTER — Other Ambulatory Visit (INDEPENDENT_AMBULATORY_CARE_PROVIDER_SITE_OTHER): Payer: Self-pay

## 2021-06-10 NOTE — Patient Instructions (Signed)
TANAI BOULER  06/10/2021     @PREFPERIOPPHARMACY @   Your procedure is scheduled on  06/16/2021.   Report to [facility] at  1030 A.M.   Call this number if you have problems the morning of surgery:  [phone]   Remember:  Follow the diet instructions given to you by the office.    Take these medicines the morning of surgery with A SIP OF WATER        pepcid, levothyroxine, protonix.     Do not wear jewelry, make-up or nail polish.  Do not wear lotions, powders, or perfumes, or deodorant.  Do not shave 48 hours prior to surgery.  Men may shave face and neck.  Do not bring valuables to the hospital.  [facility] is not responsible for any belongings or valuables.  Contacts, dentures or bridgework may not be worn into surgery.  Leave your suitcase in the car.  After surgery it may be brought to your room.  For patients admitted to the hospital, discharge time will be determined by your treatment team.  Patients discharged the day of surgery will not be allowed to drive home and must have someone with them for 24 hours.    Special instructions:     DO NOT smoke tobacco or vape for 24 hours before your procedure.  Please read over the following fact sheets that you were given. Anesthesia Post-op Instructions and Care and Recovery After Surgery      Upper Endoscopy, Adult, Care After This sheet gives you information about how to care for yourself after your procedure. Your health care provider may also give you more specific instructions. If you have problems or questions, contact your health careprovider. What can I expect after the procedure? After the procedure, it is common to have: A sore throat. Mild stomach pain or discomfort. Bloating. Nausea. Follow these instructions at home:  Follow instructions from your health care provider about what to eat or drink after your procedure. Return to your normal activities as told by your health care provider.  Ask your health care provider what activities are safe for you. Take over-the-counter and prescription medicines only as told by your health care provider. If you were given a sedative during the procedure, it can affect you for several hours. Do not drive or operate machinery until your health care provider says that it is safe. Keep all follow-up visits as told by your health care provider. This is important. Contact a health care provider if you have: A sore throat that lasts longer than one day. Trouble swallowing. Get help right away if: You vomit blood or your vomit looks like coffee grounds. You have: A fever. Bloody, black, or tarry stools. A severe sore throat or you cannot swallow. Difficulty breathing. Severe pain in your chest or abdomen. Summary After the procedure, it is common to have a sore throat, mild stomach discomfort, bloating, and nausea. If you were given a sedative during the procedure, it can affect you for several hours. Do not drive or operate machinery until your health care provider says that it is safe. Follow instructions from your health care provider about what to eat or drink after your procedure. Return to your normal activities as told by your health care provider. This information is not intended to replace advice given to you by your health care provider. Make sure you discuss any questions you have with your healthcare provider. Document Revised: 11/06/2019 Document  Reviewed: 04/10/2018 Elsevier Patient Education  2022 [place] After This sheet gives you information about how to care for yourself after your procedure. Your health care provider may also give you more specific instructions. If you have problems or questions, contact your health careprovider. What can I expect after the procedure? After the procedure, it is common to have: Tiredness. Forgetfulness about what happened after the procedure. Impaired  judgment for important decisions. Nausea or vomiting. Some difficulty with balance. Follow these instructions at home: For the time period you were told by your health care provider:     Rest as needed. Do not participate in activities where you could fall or become injured. Do not drive or use machinery. Do not drink alcohol. Do not take sleeping pills or medicines that cause drowsiness. Do not make important decisions or sign legal documents. Do not take care of children on your own. Eating and drinking Follow the diet that is recommended by your health care provider. Drink enough fluid to keep your urine pale yellow. If you vomit: Drink water, juice, or soup when you can drink without vomiting. Make sure you have little or no nausea before eating solid foods. General instructions Have a responsible adult stay with you for the time you are told. It is important to have someone help care for you until you are awake and alert. Take over-the-counter and prescription medicines only as told by your health care provider. If you have sleep apnea, surgery and certain medicines can increase your risk for breathing problems. Follow instructions from your health care provider about wearing your sleep device: Anytime you are sleeping, including during daytime naps. While taking prescription pain medicines, sleeping medicines, or medicines that make you drowsy. Avoid smoking. Keep all follow-up visits as told by your health care provider. This is important. Contact a health care provider if: You keep feeling nauseous or you keep vomiting. You feel light-headed. You are still sleepy or having trouble with balance after 24 hours. You develop a rash. You have a fever. You have redness or swelling around the IV site. Get help right away if: You have trouble breathing. You have new-onset confusion at home. Summary For several hours after your procedure, you may feel tired. You may also be  forgetful and have poor judgment. Have a responsible adult stay with you for the time you are told. It is important to have someone help care for you until you are awake and alert. Rest as told. Do not drive or operate machinery. Do not drink alcohol or take sleeping pills. Get help right away if you have trouble breathing, or if you suddenly become confused. This information is not intended to replace advice given to you by your health care provider. Make sure you discuss any questions you have with your healthcare provider. Document Revised: 07/24/2020 Document Reviewed: 10/11/2019 Elsevier Patient Education  2022 [employer].

## 2021-06-12 ENCOUNTER — Encounter (HOSPITAL_COMMUNITY)
Admission: RE | Admit: 2021-06-12 | Discharge: 2021-06-12 | Disposition: A | Discharge status: Home / Self Care | Payer: Medicare Other | Source: Ambulatory Visit | Attending: Gastroenterology | Admitting: Gastroenterology

## 2021-06-12 ENCOUNTER — Other Ambulatory Visit: Payer: Self-pay

## 2021-06-12 ENCOUNTER — Encounter (HOSPITAL_COMMUNITY): Payer: Self-pay

## 2021-06-12 DIAGNOSIS — K7469 Other cirrhosis of liver: Secondary | ICD-10-CM | POA: Insufficient documentation

## 2021-06-12 DIAGNOSIS — Z01812 Encounter for preprocedural laboratory examination: Secondary | ICD-10-CM | POA: Insufficient documentation

## 2021-06-12 LAB — BASIC METABOLIC PANEL
Anion gap: 4 — ABNORMAL LOW (ref 5–15)
BUN: 19 mg/dL (ref 8–23)
CO2: 22 mmol/L (ref 22–32)
Calcium: 7.7 mg/dL — ABNORMAL LOW (ref 8.9–10.3)
Chloride: 113 mmol/L — ABNORMAL HIGH (ref 98–111)
Creatinine, Ser: 0.87 mg/dL (ref 0.44–1.00)
GFR, Estimated: >60 mL/min (ref >60)
Glucose, Bld: 79 mg/dL (ref 70–99)
Potassium: 3.5 mmol/L (ref 3.5–5.1)
Sodium: 139 mmol/L (ref 135–145)

## 2021-06-14 LAB — HEPATITIS PANEL, ACUTE
Hep A IgM: NONREACTIVE
Hep B C IgM: NONREACTIVE
Hepatitis B Surface Ag: NONREACTIVE
Hepatitis C Ab: NONREACTIVE
SIGNAL TO CUT-OFF: 0.01 (ref <1.00)

## 2021-06-14 LAB — HEPATITIS A ANTIBODY, TOTAL: Hepatitis A AB,Total: REACTIVE — AB

## 2021-06-14 LAB — CBC WITH DIFFERENTIAL/PLATELET
Absolute Monocytes: 259 cells/uL (ref 200–950)
Basophils Absolute: 32 cells/uL (ref 0–200)
Basophils Relative: 0.9 %
Eosinophils Absolute: 81 cells/uL (ref 15–500)
Eosinophils Relative: 2.3 %
HCT: 37.5 % (ref 35.0–45.0)
Hemoglobin: 12.4 g/dL (ref 11.7–15.5)
Lymphs Abs: 1712 cells/uL (ref 850–3900)
MCH: 34.3 pg — ABNORMAL HIGH (ref 27.0–33.0)
MCHC: 33.1 g/dL (ref 32.0–36.0)
MCV: 103.9 fL — ABNORMAL HIGH (ref 80.0–100.0)
MPV: 11 fL (ref 7.5–12.5)
Monocytes Relative: 7.4 %
Neutro Abs: 1418 cells/uL — ABNORMAL LOW (ref 1500–7800)
Neutrophils Relative %: 40.5 %
Platelets: 108 10*3/uL — ABNORMAL LOW (ref 140–400)
RBC: 3.61 10*6/uL — ABNORMAL LOW (ref 3.80–5.10)
RDW: 12.4 % (ref 11.0–15.0)
Total Lymphocyte: 48.9 %
WBC: 3.5 10*3/uL — ABNORMAL LOW (ref 3.8–10.8)

## 2021-06-14 LAB — MITOCHONDRIAL ANTIBODIES: Mitochondrial M2 Ab, IgG: <=20 U

## 2021-06-14 LAB — HEPATITIS B SURFACE ANTIBODY,QUALITATIVE: Hep B S Ab: NONREACTIVE

## 2021-06-14 LAB — IRON, TOTAL/TOTAL IRON BINDING CAP
%SAT: 26 % (calc) (ref 16–45)
Iron: 78 ug/dL (ref 45–160)
TIBC: 305 mcg/dL (calc) (ref 250–450)

## 2021-06-14 LAB — COMPREHENSIVE METABOLIC PANEL
AG Ratio: 1.5 (calc) (ref 1.0–2.5)
ALT: 28 U/L (ref 6–29)
AST: 47 U/L — ABNORMAL HIGH (ref 10–35)
Albumin: 3.7 g/dL (ref 3.6–5.1)
Alkaline phosphatase (APISO): 77 U/L (ref 37–153)
BUN: 16 mg/dL (ref 7–25)
CO2: 24 mmol/L (ref 20–32)
Calcium: 8 mg/dL — ABNORMAL LOW (ref 8.6–10.4)
Chloride: 112 mmol/L — ABNORMAL HIGH (ref 98–110)
Creat: 0.89 mg/dL (ref 0.50–1.05)
Globulin: 2.5 g/dL (calc) (ref 1.9–3.7)
Glucose, Bld: 77 mg/dL (ref 65–99)
Potassium: 4.3 mmol/L (ref 3.5–5.3)
Sodium: 141 mmol/L (ref 135–146)
Total Bilirubin: 1.4 mg/dL — ABNORMAL HIGH (ref 0.2–1.2)
Total Protein: 6.2 g/dL (ref 6.1–8.1)

## 2021-06-14 LAB — AFP TUMOR MARKER: AFP-Tumor Marker: 2.2 ng/mL

## 2021-06-14 LAB — PROTIME-INR
INR: 1
Prothrombin Time: 10.6 s (ref 9.0–11.5)

## 2021-06-14 LAB — ANTI-NUCLEAR AB-TITER (ANA TITER): ANA Titer 1: 1:160 {titer} — ABNORMAL HIGH

## 2021-06-14 LAB — IGG: IgG (Immunoglobin G), Serum: 1161 mg/dL (ref 600–1540)

## 2021-06-14 LAB — FERRITIN: Ferritin: 132 ng/mL (ref 16–288)

## 2021-06-14 LAB — ANTI-SMOOTH MUSCLE ANTIBODY, IGG: Actin (Smooth Muscle) Antibody (IGG): <20 U (ref <20)

## 2021-06-14 LAB — ANA: Anti Nuclear Antibody (ANA): POSITIVE — AB

## 2021-06-16 ENCOUNTER — Other Ambulatory Visit: Payer: Self-pay

## 2021-06-16 ENCOUNTER — Encounter (HOSPITAL_COMMUNITY): Payer: Self-pay | Admitting: Gastroenterology

## 2021-06-16 ENCOUNTER — Ambulatory Visit (HOSPITAL_COMMUNITY): Payer: Medicare Other | Admitting: Anesthesiology

## 2021-06-16 ENCOUNTER — Ambulatory Visit (HOSPITAL_COMMUNITY)
Admission: RE | Admit: 2021-06-16 | Discharge: 2021-06-16 | Disposition: A | Discharge status: Home / Self Care | Payer: Medicare Other | Attending: Gastroenterology | Admitting: Gastroenterology

## 2021-06-16 ENCOUNTER — Encounter (HOSPITAL_COMMUNITY)
Admission: RE | Disposition: A | Discharge status: Home / Self Care | Payer: Self-pay | Source: Home / Self Care | Attending: Gastroenterology

## 2021-06-16 DIAGNOSIS — R1010 Upper abdominal pain, unspecified: Secondary | ICD-10-CM | POA: Diagnosis not present

## 2021-06-16 DIAGNOSIS — I5032 Chronic diastolic (congestive) heart failure: Secondary | ICD-10-CM | POA: Insufficient documentation

## 2021-06-16 DIAGNOSIS — I110 Hypertensive heart disease with heart failure: Secondary | ICD-10-CM | POA: Diagnosis not present

## 2021-06-16 DIAGNOSIS — E1140 Type 2 diabetes mellitus with diabetic neuropathy, unspecified: Secondary | ICD-10-CM | POA: Insufficient documentation

## 2021-06-16 DIAGNOSIS — R140 Abdominal distension (gaseous): Secondary | ICD-10-CM | POA: Diagnosis not present

## 2021-06-16 DIAGNOSIS — Z885 Allergy status to narcotic agent status: Secondary | ICD-10-CM | POA: Insufficient documentation

## 2021-06-16 DIAGNOSIS — Z8673 Personal history of transient ischemic attack (TIA), and cerebral infarction without residual deficits: Secondary | ICD-10-CM | POA: Insufficient documentation

## 2021-06-16 DIAGNOSIS — Z9884 Bariatric surgery status: Secondary | ICD-10-CM | POA: Diagnosis not present

## 2021-06-16 DIAGNOSIS — Z79899 Other long term (current) drug therapy: Secondary | ICD-10-CM | POA: Insufficient documentation

## 2021-06-16 DIAGNOSIS — Z800 Family history of malignant neoplasm of digestive organs: Secondary | ICD-10-CM | POA: Diagnosis not present

## 2021-06-16 DIAGNOSIS — K7581 Nonalcoholic steatohepatitis (NASH): Secondary | ICD-10-CM | POA: Diagnosis not present

## 2021-06-16 DIAGNOSIS — E039 Hypothyroidism, unspecified: Secondary | ICD-10-CM | POA: Insufficient documentation

## 2021-06-16 DIAGNOSIS — Z79890 Hormone replacement therapy (postmenopausal): Secondary | ICD-10-CM | POA: Insufficient documentation

## 2021-06-16 DIAGNOSIS — K7460 Unspecified cirrhosis of liver: Secondary | ICD-10-CM | POA: Diagnosis not present

## 2021-06-16 DIAGNOSIS — K449 Diaphragmatic hernia without obstruction or gangrene: Secondary | ICD-10-CM | POA: Diagnosis not present

## 2021-06-16 DIAGNOSIS — Z882 Allergy status to sulfonamides status: Secondary | ICD-10-CM | POA: Diagnosis not present

## 2021-06-16 DIAGNOSIS — Z886 Allergy status to analgesic agent status: Secondary | ICD-10-CM | POA: Diagnosis not present

## 2021-06-16 DIAGNOSIS — R197 Diarrhea, unspecified: Secondary | ICD-10-CM | POA: Diagnosis not present

## 2021-06-16 DIAGNOSIS — K580 Irritable bowel syndrome with diarrhea: Secondary | ICD-10-CM | POA: Diagnosis not present

## 2021-06-16 DIAGNOSIS — F32A Depression, unspecified: Secondary | ICD-10-CM | POA: Insufficient documentation

## 2021-06-16 DIAGNOSIS — K7469 Other cirrhosis of liver: Secondary | ICD-10-CM | POA: Diagnosis not present

## 2021-06-16 DIAGNOSIS — Z888 Allergy status to other drugs, medicaments and biological substances status: Secondary | ICD-10-CM | POA: Insufficient documentation

## 2021-06-16 DIAGNOSIS — J449 Chronic obstructive pulmonary disease, unspecified: Secondary | ICD-10-CM | POA: Insufficient documentation

## 2021-06-16 DIAGNOSIS — Z91040 Latex allergy status: Secondary | ICD-10-CM | POA: Insufficient documentation

## 2021-06-16 DIAGNOSIS — Z7901 Long term (current) use of anticoagulants: Secondary | ICD-10-CM | POA: Insufficient documentation

## 2021-06-16 DIAGNOSIS — G4733 Obstructive sleep apnea (adult) (pediatric): Secondary | ICD-10-CM | POA: Diagnosis not present

## 2021-06-16 DIAGNOSIS — Z7951 Long term (current) use of inhaled steroids: Secondary | ICD-10-CM | POA: Insufficient documentation

## 2021-06-16 DIAGNOSIS — I2510 Atherosclerotic heart disease of native coronary artery without angina pectoris: Secondary | ICD-10-CM | POA: Diagnosis not present

## 2021-06-16 DIAGNOSIS — K2980 Duodenitis without bleeding: Secondary | ICD-10-CM | POA: Insufficient documentation

## 2021-06-16 HISTORY — PX: BIOPSY: SHX5522

## 2021-06-16 HISTORY — PX: ESOPHAGOGASTRODUODENOSCOPY (EGD) WITH PROPOFOL: SHX5813

## 2021-06-16 LAB — GLUCOSE, CAPILLARY
Glucose-Capillary: 75 mg/dL (ref 70–99)
Glucose-Capillary: 79 mg/dL (ref 70–99)

## 2021-06-16 SURGERY — ESOPHAGOGASTRODUODENOSCOPY (EGD) WITH PROPOFOL
Anesthesia: General

## 2021-06-16 MED ORDER — LACTATED RINGERS IV SOLN
INTRAVENOUS | Status: DC | PRN
  Administered 2021-06-16: 1000 mL via INTRAVENOUS

## 2021-06-16 MED ORDER — LIDOCAINE HCL (CARDIAC) PF 100 MG/5ML IV SOSY
PREFILLED_SYRINGE | INTRAVENOUS | Status: DC | PRN
  Administered 2021-06-16: 50 mg via INTRAVENOUS

## 2021-06-16 MED ORDER — STERILE WATER FOR IRRIGATION IR SOLN
Status: DC | PRN
  Administered 2021-06-16: 100 mL

## 2021-06-16 MED ORDER — PROPOFOL 10 MG/ML IV BOLUS
INTRAVENOUS | Status: DC | PRN
  Administered 2021-06-16: 100 mg via INTRAVENOUS
  Administered 2021-06-16 (×2): 30 mg via INTRAVENOUS
  Administered 2021-06-16: 40 mg via INTRAVENOUS

## 2021-06-16 MED ORDER — LACTATED RINGERS IV SOLN: INTRAVENOUS | Status: DC

## 2021-06-16 NOTE — Interval H&P Note (Signed)
History and Physical Interval Note:  06/16/2021 11:43 AM  Kimberly [name] is a 65 y.o. female with past medical history of compensated NASH cirrhosis, CHF, coronary artery disease, depression, diabetes, COPD, IBS-D, hypothyroidism, hypertension, OSA, stroke, seizures, obesity status post duodenal switch, who presents for evaluation of multiple complaints including loose bowel movements, abdominal bloating, pain.    Patient states that she is still having persistent bloating and abdominal pain in her upper abdomen.  Denies any melena, hematochezia, nausea or vomiting.  Still having diarrhea as she has moving her bowels multiple times per day but less severe as in the past.  BP 128/63   Pulse (!) 52   Temp 97.6 F (36.4 C) (Oral)   Resp 15   SpO2 99%  GENERAL: The patient is AO x3, in no acute distress. HEENT: Head is normocephalic and atraumatic. EOMI are intact. Mouth is well hydrated and without lesions. NECK: Supple. No masses LUNGS: Clear to auscultation. No presence of rhonchi/wheezing/rales. Adequate chest expansion HEART: RRR, normal s1 and s2. ABDOMEN: Soft, nontender, no guarding, no peritoneal signs, and nondistended. BS +. No masses. EXTREMITIES: Without any cyanosis, clubbing, rash, lesions or edema. NEUROLOGIC: AOx3, no focal motor deficit. SKIN: no jaundice, no rashes   Kimberly [name]  has presented today for surgery, with the diagnosis of Cirrhosis.  The various methods of treatment have been discussed with the patient and family. After consideration of risks, benefits and other options for treatment, the patient has consented to  Procedure(s) with comments: ESOPHAGOGASTRODUODENOSCOPY (EGD) WITH PROPOFOL (N/A) - 12:50 as a surgical intervention.  The patient's history has been reviewed, patient examined, no change in status, stable for surgery.  I have reviewed the patient's chart and labs.  Questions were answered to the patient's satisfaction.     Kimberly [name]  Kimberly [name]

## 2021-06-16 NOTE — Anesthesia Preprocedure Evaluation (Signed)
Anesthesia Evaluation  Patient identified by MRN, date of birth, ID band Patient awake    Reviewed: Allergy & Precautions, H&P , NPO status , Patient's Chart, lab work & pertinent test results, reviewed documented beta blocker date and time   Airway Mallampati: II  TM Distance: >3 FB Neck ROM: full    Dental no notable dental hx.    Pulmonary asthma , sleep apnea ,    Pulmonary exam normal breath sounds clear to auscultation       Cardiovascular Exercise Tolerance: Good hypertension,  Rhythm:regular Rate:Normal     Neuro/Psych  Headaches, Seizures -, Well Controlled,  PSYCHIATRIC DISORDERS Depression  Neuromuscular disease    GI/Hepatic Neg liver ROS, hiatal hernia, GERD  Medicated,  Endo/Other  diabetes, Type 2Hypothyroidism   Renal/GU negative Renal ROS  negative genitourinary   Musculoskeletal   Abdominal   Peds  Hematology  (+) Blood dyscrasia, anemia ,   Anesthesia Other Findings   Reproductive/Obstetrics negative OB ROS                             Anesthesia Physical Anesthesia Plan  ASA: 3  Anesthesia Plan: General   Post-op Pain Management:    Induction:   PONV Risk Score and Plan: Propofol infusion  Airway Management Planned:   Additional Equipment:   Intra-op Plan:   Post-operative Plan:   Informed Consent: I have reviewed the patients History and Physical, chart, labs and discussed the procedure including the risks, benefits and alternatives for the proposed anesthesia with the patient or authorized representative who has indicated his/her understanding and acceptance.     Dental Advisory Given  Plan Discussed with: CRNA  Anesthesia Plan Comments:         Anesthesia Quick Evaluation

## 2021-06-16 NOTE — Anesthesia Procedure Notes (Signed)
Date/Time: 06/16/2021 11:50 AM Performed by: Orlie Dakin, CRNA Pre-anesthesia Checklist: Patient identified, Emergency Drugs available, Suction available and Patient being monitored Patient Re-evaluated:Patient Re-evaluated prior to induction Oxygen Delivery Method: Nasal cannula Induction Type: IV induction Placement Confirmation: positive ETCO2

## 2021-06-16 NOTE — Anesthesia Postprocedure Evaluation (Signed)
Anesthesia Post Note  Patient: [name]  Procedure(s) Performed: ESOPHAGOGASTRODUODENOSCOPY (EGD) WITH PROPOFOL BIOPSY  Patient location during evaluation: Phase II Anesthesia Type: General Level of consciousness: awake Pain management: pain level controlled Vital Signs Assessment: post-procedure vital signs reviewed and stable Respiratory status: spontaneous breathing and respiratory function stable Cardiovascular status: blood pressure returned to baseline and stable Postop Assessment: no headache and no apparent nausea or vomiting Anesthetic complications: no Comments: Late entry   No notable events documented.   Last Vitals:  Vitals:   06/16/21 1111 06/16/21 1210  BP: 128/63 (!) 95/58  Pulse: (!) 52 (!) 58  Resp: 15 16  Temp: 36.4 C 36.9 C  SpO2: 99% 98%    Last Pain:  Vitals:   06/16/21 1210  TempSrc: Axillary  PainSc: 0-No pain                 [name]

## 2021-06-16 NOTE — Op Note (Signed)
[facility] Patient Name: Kimberly [name] Procedure Date: 06/16/2021 11:22 AM MRN: [id number] Date of Birth: May 26, 1956 Attending MD: Maylon Peppers ,  CSN: [id number] Age: 65 Admit Type: Outpatient Procedure:                Upper GI endoscopy Indications:              Upper abdominal pain, Abdominal bloating, Diarrhea Providers:                Maylon Peppers, Rosina Lowenstein, RN, Aram Candela Referring MD:              Medicines:                Monitored Anesthesia Care Complications:            No immediate complications. Estimated Blood Loss:     Estimated blood loss: none. Procedure:                Pre-Anesthesia Assessment:                           - Prior to the procedure, a History and Physical                            was performed, and patient medications, allergies                            and sensitivities were reviewed. The patient's                            tolerance of previous anesthesia was reviewed.                           - The risks and benefits of the procedure and the                            sedation options and risks were discussed with the                            patient. All questions were answered and informed                            consent was obtained.                           - ASA Grade Assessment: III - A patient with severe                            systemic disease.                           After obtaining informed consent, the endoscope was                            passed under direct vision. Throughout the                            procedure, the  patient's blood pressure, pulse, and                            oxygen saturations were monitored continuously. The                            GIF-H190 ([id number]) scope was introduced through the                            mouth, and advanced to the second part of duodenum.                            The upper GI endoscopy was accomplished without                             difficulty. The patient tolerated the procedure                            well. Scope In: 11:52:04 AM Scope Out: 12:01:05 PM Total Procedure Duration: 0 hours 9 minutes 1 second  Findings:      A small hiatal hernia was present. No varices were seen.      The entire examined stomach was normal, although there was presence of a       questionable scar in the major curvature (previous gastric sleeve?).      There was evidence of a widely patent duodenal switch in the first       portion of the duodenum. This was characterized by healthy appearing       mucosa. Biopsies were taken from the duodenal bulb, as well as from the       bowel distal to the anastomosis with a cold forceps for histology. Impression:               - Small hiatal hernia.                           - Normal stomach.                           - Widely patent duodenal switch, characterized by                            healthy appearing mucosa was found. Biopsied. Moderate Sedation:      Per Anesthesia Care Recommendation:           - Discharge patient to home (ambulatory).                           - Resume previous diet.                           - Await pathology results.                           - Follow up with your bariatric surgeon                           -Follow  up with breath test at [place] 1 tablet q8h as needed for abdominal                            pain                           -Repeat EGD in 2 years. Procedure Code(s):        --- Professional ---                           [phone], Esophagogastroduodenoscopy, flexible,                            transoral; with biopsy, single or multiple Diagnosis Code(s):        --- Professional ---                           K44.9, Diaphragmatic hernia without obstruction or                            gangrene                           Z98.84, Bariatric surgery status                           R10.10, Upper abdominal  pain, unspecified                           R14.0, Abdominal distension (gaseous)                           R19.7, Diarrhea, unspecified CPT copyright 2019 [employer]. All rights reserved. The codes documented in this report are preliminary and upon coder review may  be revised to meet current compliance requirements. Maylon Peppers, MD Maylon Peppers,  06/16/2021 12:12:16 PM This report has been signed electronically. Number of Addenda: 0

## 2021-06-16 NOTE — Transfer of Care (Signed)
Immediate Anesthesia Transfer of Care Note  Patient: Kimberly [name]  Procedure(s) Performed: ESOPHAGOGASTRODUODENOSCOPY (EGD) WITH PROPOFOL BIOPSY  Patient Location: Short Stay  Anesthesia Type:General  Level of Consciousness: awake  Airway & Oxygen Therapy: Patient Spontanous Breathing  Post-op Assessment: Report given to RN and Post -op Vital signs reviewed and stable  Post vital signs: Reviewed and stable  Last Vitals:  Vitals Value Taken Time  BP    Temp    Pulse    Resp    SpO2      Last Pain:  Vitals:   06/16/21 1148  TempSrc:   PainSc: 0-No pain         Complications: No notable events documented.

## 2021-06-16 NOTE — Discharge Instructions (Addendum)
You are being discharged to home.  Resume your previous diet.  We are waiting for your pathology results.  Follow up with your bariatric surgeon Follow up with breath test at [place] 1 tablet q8h as needed for abdominal pain Repeat EGD in 2 years.

## 2021-06-18 LAB — SURGICAL PATHOLOGY

## 2021-06-19 ENCOUNTER — Encounter (HOSPITAL_COMMUNITY): Payer: Self-pay | Admitting: Gastroenterology

## 2021-06-19 DIAGNOSIS — H353231 Exudative age-related macular degeneration, bilateral, with active choroidal neovascularization: Secondary | ICD-10-CM | POA: Diagnosis not present

## 2021-06-22 ENCOUNTER — Other Ambulatory Visit: Payer: Self-pay | Admitting: Internal Medicine

## 2021-06-22 DIAGNOSIS — Z139 Encounter for screening, unspecified: Secondary | ICD-10-CM

## 2021-06-23 DIAGNOSIS — J449 Chronic obstructive pulmonary disease, unspecified: Secondary | ICD-10-CM | POA: Diagnosis not present

## 2021-06-28 ENCOUNTER — Emergency Department (HOSPITAL_COMMUNITY)
Admission: EM | Admit: 2021-06-28 | Discharge: 2021-06-28 | Disposition: A | Discharge status: Home / Self Care | Payer: Medicare Other | Attending: Emergency Medicine | Admitting: Emergency Medicine

## 2021-06-28 ENCOUNTER — Encounter (HOSPITAL_COMMUNITY): Payer: Self-pay | Admitting: *Deleted

## 2021-06-28 ENCOUNTER — Other Ambulatory Visit: Payer: Self-pay

## 2021-06-28 DIAGNOSIS — J449 Chronic obstructive pulmonary disease, unspecified: Secondary | ICD-10-CM | POA: Diagnosis not present

## 2021-06-28 DIAGNOSIS — I5032 Chronic diastolic (congestive) heart failure: Secondary | ICD-10-CM | POA: Insufficient documentation

## 2021-06-28 DIAGNOSIS — Z79899 Other long term (current) drug therapy: Secondary | ICD-10-CM | POA: Insufficient documentation

## 2021-06-28 DIAGNOSIS — Z7951 Long term (current) use of inhaled steroids: Secondary | ICD-10-CM | POA: Diagnosis not present

## 2021-06-28 DIAGNOSIS — I110 Hypertensive heart disease with heart failure: Secondary | ICD-10-CM | POA: Insufficient documentation

## 2021-06-28 DIAGNOSIS — E782 Mixed hyperlipidemia: Secondary | ICD-10-CM | POA: Diagnosis not present

## 2021-06-28 DIAGNOSIS — E1139 Type 2 diabetes mellitus with other diabetic ophthalmic complication: Secondary | ICD-10-CM | POA: Diagnosis not present

## 2021-06-28 DIAGNOSIS — U071 COVID-19: Secondary | ICD-10-CM | POA: Insufficient documentation

## 2021-06-28 DIAGNOSIS — E039 Hypothyroidism, unspecified: Secondary | ICD-10-CM | POA: Diagnosis not present

## 2021-06-28 DIAGNOSIS — E1136 Type 2 diabetes mellitus with diabetic cataract: Secondary | ICD-10-CM | POA: Diagnosis not present

## 2021-06-28 DIAGNOSIS — E1151 Type 2 diabetes mellitus with diabetic peripheral angiopathy without gangrene: Secondary | ICD-10-CM | POA: Insufficient documentation

## 2021-06-28 DIAGNOSIS — Z91040 Latex allergy status: Secondary | ICD-10-CM | POA: Diagnosis not present

## 2021-06-28 DIAGNOSIS — Z96651 Presence of right artificial knee joint: Secondary | ICD-10-CM | POA: Diagnosis not present

## 2021-06-28 DIAGNOSIS — J45909 Unspecified asthma, uncomplicated: Secondary | ICD-10-CM | POA: Diagnosis not present

## 2021-06-28 DIAGNOSIS — J029 Acute pharyngitis, unspecified: Secondary | ICD-10-CM

## 2021-06-28 DIAGNOSIS — I2510 Atherosclerotic heart disease of native coronary artery without angina pectoris: Secondary | ICD-10-CM | POA: Diagnosis not present

## 2021-06-28 DIAGNOSIS — E1169 Type 2 diabetes mellitus with other specified complication: Secondary | ICD-10-CM | POA: Insufficient documentation

## 2021-06-28 DIAGNOSIS — R590 Localized enlarged lymph nodes: Secondary | ICD-10-CM | POA: Insufficient documentation

## 2021-06-28 MED ORDER — MOLNUPIRAVIR EUA 200MG CAPSULE: 4.0000 | ORAL_CAPSULE | Freq: Two times a day (BID) | ORAL | 0 refills | Status: AC

## 2021-06-28 MED ORDER — MOLNUPIRAVIR EUA 200MG CAPSULE: 4.0000 | ORAL_CAPSULE | Freq: Two times a day (BID) | ORAL | 0 refills | Status: DC

## 2021-06-28 MED ORDER — BENZONATATE 100 MG PO CAPS: 100.0000 mg | ORAL_CAPSULE | Freq: Three times a day (TID) | ORAL | 0 refills | Status: DC

## 2021-06-28 MED ORDER — LIDOCAINE VISCOUS HCL 2 % MT SOLN: 15.0000 mL | OROMUCOSAL | 0 refills | Status: DC | PRN

## 2021-06-28 NOTE — ED Triage Notes (Signed)
Sore throat, chills, aching al over. Patient has multiple complaints onset 2 days ago, states she is having trouble keeping food on stomach

## 2021-06-28 NOTE — Discharge Instructions (Addendum)
If your COVID test comes back positive you will need to take the antiviral medication that I have given you.  This medication will only help if you have the virus.  If your test is negative do not take this medication. Please take lidocaine viscus solution every 4 hours as needed for severe sore throat, you may use this in conjunction with Tylenol or ibuprofen, DayQuil or NyQuil. Emergency department for severe worsening symptoms.  Thank you for letting us take care of you today!  Please obtain all of your results from medical records or have your doctors office obtain the results - share them with your doctor - you should be seen at your doctors office in the next 2 days. Call today to arrange your follow up. Take the medications as prescribed. Please review all of the medicines and only take them if you do not have an allergy to them. Please be aware that if you are taking birth control pills, taking other prescriptions, ESPECIALLY ANTIBIOTICS may make the birth control ineffective - if this is the case, either do not engage in sexual activity or use alternative methods of birth control such as condoms until you have finished the medicine and your family doctor says it is OK to restart them. If you are on a blood thinner such as COUMADIN, be aware that any other medicine that you take may cause the coumadin to either work too much, or not enough - you should have your coumadin level rechecked in next 7 days if this is the case.  ?  It is also a possibility that you have an allergic reaction to any of the medicines that you have been prescribed - Everybody reacts differently to medications and while MOST people have no trouble with most medicines, you may have a reaction such as nausea, vomiting, rash, swelling, shortness of breath. If this is the case, please stop taking the medicine immediately and contact your physician.   If you were given a medication in the ED such as percocet, vicodin, or morphine,  be aware that these medicines are sedating and may change your ability to take care of yourself adequately for several hours after being given this medicines - you should not drive or take care of small children if you were given this medicine in the Emergency Department or if you have been prescribed these types of medicines. ?   You should return to the ER IMMEDIATELY if you develop severe or worsening symptoms.

## 2021-06-28 NOTE — ED Provider Notes (Signed)
[facility] EMERGENCY DEPARTMENT Provider Note   CSN: [id number] Arrival date & time: 06/28/21  1323     History Chief Complaint  Patient presents with   Sore Throat    Kimberly [name] is a 65 y.o. female.   Sore Throat   Patient is a 65 year old female, she has a history of multiple medical problems including a history of diabetes, she has a history of cirrhosis, she has a history of chronic bronchitis, she has a history of irritable bowel syndrome and has had some element of chronic diarrhea since undergoing gastric bypass surgery years ago.  She presents to the hospital today with a chief complaint of a sore throat which started 2 days ago.  The pain started as a sore throat with a burning and a painful throat, this then spread to have some drainage in the back of her throat and a bit of a cough.  She has body aches, muscle aches, a severe headache and has had subjective fevers and chills.  Her husband has been sick with similar symptoms including the chills and coughing.  She took a home COVID test which she reports was negative.  The patient denies significant abdominal pain, she does endorse having intermittent diarrhea over time, this is not new, she has recently had a little bit more nausea.  She has not been formally evaluated for this complaint.  She has not been seen by her family doctor for this complaint.  She is scheduled to have some type of stool test in [place] this week  Past Medical History:  Diagnosis Date   Anemia    PMH: as a child   Arthritis    Asthma    CAD (coronary artery disease)    nonobstructive by cath, 6/08 (false positive Cardiolite) normal stress echo, 7/11   CHF (congestive heart failure) (HCC)    Chronic back pain    Chronic bronchitis (HCC)    Cirrhosis ([place])    Complication of anesthesia    had an asthma attack when woke up from procedure   Complication of anesthesia    asthma attack with one surgery   COPD (chronic obstructive  pulmonary disease) ([place])    Degenerative joint disease    Depression    Diabetes mellitus without complication ([place])    DJD (degenerative joint disease)    Fatty liver    Fibromyalgia    GERD (gastroesophageal reflux disease)    Glaucoma    Headache(784.0)    Heart failure, diastolic, chronic (HCC)    Heart murmur    PMH:As a child only   Hernia of abdominal cavity    "upper and lower hernia"   History of hiatal hernia    HTN (hypertension)    Hypertension    Hypothyroidism    IBS (irritable bowel syndrome)    IDDM (insulin dependent diabetes mellitus)    Morbid obesity (HCC)    Neuropathy    associated with diabetes   Obstructive sleep apnea    Pancreatitis    Pinched nerve in neck    Pneumonia    as a child   Pulmonary hypertension ([place])    Rheumatic fever    PMH: as a child   Seizures ([place])    PMH: only as a child   Seizures ([place])    in childhood   Shortness of breath    Sleep apnea    Spinal stenosis    Stroke ([place])    12/09/2013    Patient Active  Problem List   Diagnosis Date Noted   IBS (irritable bowel syndrome) 06/08/2021   Chronic diarrhea 06/08/2021   Irritable bowel syndrome with diarrhea 06/08/2021   Quadriceps weakness 03/19/2021   S/P total knee arthroplasty, right 12/18/2020   Morbid obesity ([place]) 02/08/2018   Pancytopenia ([place]) 04/09/2017   Other cirrhosis of liver ([place]) 04/09/2017   DM type 2 causing vascular disease ([place]) 04/06/2017   HTN (hypertension) 04/06/2017   OSA (obstructive sleep apnea) 04/06/2017   Asthma in adult 04/06/2017   Obesity (BMI 30-39.9) 04/06/2017   Primary osteoarthritis of both knees 10/26/2016   Primary osteoarthritis of both hands 10/26/2016   Primary osteoarthritis of both feet 10/26/2016   COPD (chronic obstructive pulmonary disease) ([place]) 09/27/2016   Hypothyroidism 09/27/2016   Vertebral compression fracture ([place]) 09/27/2016   Fibromyalgia 09/27/2016   Restless leg syndrome 09/27/2016   Lumbar radiculitis  10/01/2014   Nerve pain 08/30/2014   Lumbar stenosis with neurogenic claudication 08/28/2014   Spondylosis of lumbar region without myelopathy or radiculopathy 08/23/2014   Lumbosacral stenosis with neurogenic claudication ([place]) 08/23/2014   Vaginal discharge 06/22/2014   Chest pain 06/21/2014   Spinal stenosis 08/23/2011   Obstructive sleep apnea 08/23/2011   Diabetes ([place]) 08/12/2010   CAD 08/12/2010   Chronic diastolic heart failure ([place]) 08/12/2010   Mixed hyperlipidemia 06/23/2009   OVERWEIGHT/OBESITY 06/23/2009   Essential hypertension, benign 06/23/2009   CHEST PAIN-UNSPECIFIED 06/23/2009    Past Surgical History:  Procedure Laterality Date   ABDOMINAL HYSTERECTOMY     ABDOMINAL HYSTERECTOMY     partial hysterectomy   APPENDECTOMY     BACK SURGERY     BACK SURGERY     disc surgery with complications and damage to right side   BILATERAL KNEE ARTHROSCOPY  2000, 2009   BIOPSY  06/16/2021   Procedure: BIOPSY;  Surgeon: Harvel Quale, MD;  [facility];  Service: Gastroenterology;;   CARDIAC CATHETERIZATION     2009   CATARACT EXTRACTION W/ INTRAOCULAR LENS  IMPLANT, BILATERAL     CHOLECYSTECTOMY  1994   CHOLECYSTECTOMY     COLONOSCOPY N/A 08/01/2013   Procedure: COLONOSCOPY;  Surgeon: Rogene Houston, MD;  [facility];  Service: Endoscopy;  Laterality: N/A;  930   COLONOSCOPY N/A 08/03/2017   Procedure: COLONOSCOPY;  Surgeon: Rogene Houston, MD;  [facility];  Service: Endoscopy;  Laterality: N/A;  12:00   CYST EXCISION     Left breast   ESOPHAGOGASTRODUODENOSCOPY (EGD) WITH PROPOFOL N/A 06/16/2021   Procedure: ESOPHAGOGASTRODUODENOSCOPY (EGD) WITH PROPOFOL;  Surgeon: Harvel Quale, MD;  [facility];  Service: Gastroenterology;  Laterality: N/A;  12:50   EXCISIONAL HEMORRHOIDECTOMY     EYE SURGERY  1967   EYE SURGERY     bilateral cataract removal with lens implants   EYE SURGERY     growth removed  from right eye lid   EYE SURGERY     bilateral eye surgery age 28 t"to straighten eyes'   heel (other)  2005   HEEL SPUR SURGERY     Right heel - growth removal and rebuilding of heel   HEMORRHOID SURGERY     HERNIA REPAIR     KNEE ARTHROSCOPY Bilateral    LUMBAR LAMINECTOMY/DECOMPRESSION MICRODISCECTOMY Right 08/23/2014   Procedure: LUMBAR LAMINECTOMY/DECOMPRESSION MICRODISCECTOMY 1 LEVEL;  Surgeon: Charlie Pitter, MD;  Location: [place] [facility];  Service: Neurosurgery;  Laterality: Right;  LUMBAR LAMINECTOMY/DECOMPRESSION MICRODISCECTOMY 1 LEVEL LUMBAR 2-3  ovaries removed  2003   PARTIAL HYSTERECTOMY  1981   SPHINCTEROTOMY     spinahatomy  [place] Right 11/05/2020   Procedure: RIGHT TOTAL KNEE ARTHROPLASTY;  Surgeon: Marybelle Killings, MD;  Location: [facility];  Service: Orthopedics;  Laterality: Right;   TUBAL LIGATION  1975   TUBAL LIGATION       OB History   No obstetric history on file.     Family History  Problem Relation Age of Onset   Cancer Other    Heart failure Other    Colon cancer Brother     Social History   Tobacco Use   Smoking status: Never   Smokeless tobacco: Never  Vaping Use   Vaping Use: Never used  Substance Use Topics   Alcohol use: No   Drug use: No    Home Medications Prior to Admission medications   Medication Sig Start Date End Date Taking? Authorizing Provider  lidocaine (XYLOCAINE) 2 % solution Use as directed 15 mLs in the mouth or throat every 4 (four) hours as needed for mouth pain. 06/28/21  Yes Noemi Chapel, MD  molnupiravir EUA 200 mg CAPS Take 4 capsules (800 mg total) by mouth 2 (two) times daily for 5 days. 06/28/21 07/03/21 Yes Noemi Chapel, MD  albuterol (VENTOLIN HFA) 108 (90 Base) MCG/ACT inhaler Inhale 2 puffs into the lungs 4 (four) times daily as needed for shortness of breath. 09/22/20   [provider]  atorvastatin (LIPITOR) 10 MG tablet Take 10 mg by mouth daily. 08/15/20   [provider]   BREO ELLIPTA 200-25 MCG/INH AEPB Inhale 2 puffs into the lungs daily. 08/22/20   [provider]  Calcium Carb-Cholecalciferol (CALCIUM 500+D PO) Take 1 tablet by mouth in the morning and at bedtime.    [provider]  Cholecalciferol (VITAMIN D) 50 MCG (2000 UT) tablet Take 2,000 Units by mouth daily.    [provider]  Cyanocobalamin (VITAMIN B-12) 5000 MCG SUBL Place 5,000 mcg under the tongue every other day.    [provider]  enoxaparin (LOVENOX) 30 MG/0.3ML injection Inject 0.3 mLs (30 mg total) into the skin every 12 (twelve) hours for 21 days. Must use x 3 weeks for postop DVT prophylaxis Patient taking differently: Inject 30 mg into the skin every 28 (twenty-eight) days. 11/07/20 06/09/21  Lanae Crumbly, PA-C  famotidine (PEPCID) 40 MG tablet Take 40 mg by mouth every morning.    [provider]  ferrous sulfate 325 (65 FE) MG EC tablet Take 325 mg by mouth daily with breakfast.    [provider]  furosemide (LASIX) 20 MG tablet Take 20 mg by mouth 2 (two) times daily. 10/23/20   [provider]  Homeopathic Products (SLEEP CALM SLEEP RELIEF SL) Place 1 tablet under the tongue at bedtime as needed (sleep).    [provider]  hyoscyamine (LEVSIN) 0.125 MG tablet Take 1 tablet (0.125 mg total) by mouth every 8 (eight) hours as needed. 06/08/21   Harvel Quale, MD  levothyroxine (SYNTHROID) 112 MCG tablet Take 112 mcg by mouth daily before breakfast. 09/22/20   [provider]  Multiple Vitamin (MULTIVITAMIN PO) Take 2 tablets by mouth daily. ADEK vitamin chewable    [provider]  multivitamin-lutein (OCUVITE-LUTEIN) CAPS capsule Take 1 capsule by mouth daily.    [provider]  olopatadine (PATANOL) 0.1 % ophthalmic solution Place 1 drop into both eyes daily.    [provider]  pantoprazole (PROTONIX) 40 MG tablet Take 40 mg by mouth daily. 10/23/20   [provider]  spironolactone (ALDACTONE) 25 MG tablet Take 25 mg by mouth daily. 11/01/19   [provider]  Zinc 15 MG CAPS Take 15 mg by mouth daily.    [provider]    Allergies    Cinnamon, Hydrocodone, Naproxen, Other, Other, Oxycodone, Feldene [piroxicam], Lyrica [pregabalin], Nsaids, Phenergan [promethazine hcl], Sulfa antibiotics, Latex, and Tape  Review of Systems   Review of Systems  All other systems reviewed and are negative.  Physical Exam Updated Vital Signs BP 124/77   Pulse 86   Temp 99.3 F (37.4 C) (Oral)   Resp 20   Ht 1.524 m (5')   Wt 59 kg   SpO2 99%   BMI 25.39 kg/m   Physical Exam Vitals and nursing note reviewed.  Constitutional:      General: She is not in acute distress.    Appearance: She is well-developed.  HENT:     Head: Normocephalic and atraumatic.     Nose: No congestion or rhinorrhea.     Mouth/Throat:     Pharynx: No oropharyngeal exudate.     Comments: Mild erythema of the throat - no exudate and no hypertrophy or asymetry - normal phonation - normal secretions and tolerating it well. Eyes:     General: No scleral icterus.       Right eye: No discharge.        Left eye: No discharge.     Conjunctiva/sclera: Conjunctivae normal.     Pupils: Pupils are equal, round, and reactive to light.  Neck:     Thyroid: No thyromegaly.     Vascular: No JVD.  Cardiovascular:     Rate and Rhythm: Normal rate and regular rhythm.     Heart sounds: Normal heart sounds. No murmur heard.   No friction rub. No gallop.  Pulmonary:     Effort: Pulmonary effort is normal. No respiratory distress.     Breath sounds: Normal breath sounds. No wheezing or rales.     Comments: Clear lungs - no wheezing / rales or increased WOB Abdominal:     General: Bowel sounds are normal. There is no distension.     Palpations: Abdomen is soft. There is no mass.     Tenderness: There is no abdominal tenderness.  Musculoskeletal:         General: No tenderness. Normal range of motion.     Cervical back: Normal range of motion and neck supple. No tenderness.     Right lower leg: No edema.     Left lower leg: No edema.  Lymphadenopathy:     Cervical: Cervical adenopathy present.  Skin:    General: Skin is warm and dry.     Findings: No erythema or rash.  Neurological:     General: No focal deficit present.     Mental Status: She is alert.     Coordination: Coordination normal.  Psychiatric:        Behavior: Behavior normal.    ED Results / Procedures / Treatments   Labs (all labs ordered are listed, but only abnormal results are displayed) Labs Reviewed  SARS CORONAVIRUS 2 (TAT 6-24 HRS)    EKG None  Radiology No results found.  Procedures Procedures   Medications Ordered in ED Medications - No data to display  ED Course  I have reviewed the triage vital signs and the nursing  notes.  Pertinent labs & imaging results that were available during my care of the patient were reviewed by me and considered in my medical decision making (see chart for details).    MDM Rules/Calculators/A&P                           No distress Clear lungs VS are normal - without fever of tachycarida O2 normal R/o covid with PCR test - home with antiviral to use if + Pt agreeable.  Kimberly [name] was evaluated in Emergency Department on 06/28/2021 for the symptoms described in the history of present illness. She was evaluated in the context of the global COVID-19 pandemic, which necessitated consideration that the patient might be at risk for infection with the SARS-CoV-2 virus that causes COVID-19. Institutional protocols and algorithms that pertain to the evaluation of patients at risk for COVID-19 are in a state of rapid change based on information released by regulatory bodies including the CDC and federal and state organizations. These policies and algorithms were followed during the patient's care in the ED.  Pt  expressed understanding  Final Clinical Impression(s) / ED Diagnoses Final diagnoses:  Viral pharyngitis    Rx / DC Orders ED Discharge Orders          Ordered    molnupiravir EUA 200 mg CAPS  2 times daily        06/28/21 1405    lidocaine (XYLOCAINE) 2 % solution  Every 4 hours PRN        06/28/21 1406             Noemi Chapel, MD 06/28/21 1407

## 2021-06-29 DIAGNOSIS — K7460 Unspecified cirrhosis of liver: Secondary | ICD-10-CM | POA: Diagnosis not present

## 2021-06-29 DIAGNOSIS — Z299 Encounter for prophylactic measures, unspecified: Secondary | ICD-10-CM | POA: Diagnosis not present

## 2021-06-29 DIAGNOSIS — J449 Chronic obstructive pulmonary disease, unspecified: Secondary | ICD-10-CM | POA: Diagnosis not present

## 2021-06-29 DIAGNOSIS — U071 COVID-19: Secondary | ICD-10-CM | POA: Diagnosis not present

## 2021-06-29 DIAGNOSIS — I700 Atherosclerosis of aorta: Secondary | ICD-10-CM | POA: Diagnosis not present

## 2021-06-29 LAB — SARS CORONAVIRUS 2 (TAT 6-24 HRS): SARS Coronavirus 2: POSITIVE — AB

## 2021-07-03 ENCOUNTER — Ambulatory Visit: Payer: Medicare Other | Admitting: Cardiology

## 2021-07-17 DIAGNOSIS — H353231 Exudative age-related macular degeneration, bilateral, with active choroidal neovascularization: Secondary | ICD-10-CM | POA: Diagnosis not present

## 2021-07-24 DIAGNOSIS — J449 Chronic obstructive pulmonary disease, unspecified: Secondary | ICD-10-CM | POA: Diagnosis not present

## 2021-07-31 NOTE — Progress Notes (Deleted)
Office Visit Note  Patient: Kimberly [name]             Date of Birth: April 22, 1956           MRN: [id number]             PCP: Glenda Chroman, MD Referring: Montez Morita, Darden Dates* Visit Date: 08/14/2021 Occupation: @GUAROCC @  Subjective:  No chief complaint on file.   History of Present Illness: Kimberly [name] is a 65 y.o. female ***   Activities of Daily Living:  Patient reports morning stiffness for *** {minute/hour:19697}.   Patient {ACTIONS;DENIES/REPORTS:21021675::"Denies"} nocturnal pain.  Difficulty dressing/grooming: {ACTIONS;DENIES/REPORTS:21021675::"Denies"} Difficulty climbing stairs: {ACTIONS;DENIES/REPORTS:21021675::"Denies"} Difficulty getting out of chair: {ACTIONS;DENIES/REPORTS:21021675::"Denies"} Difficulty using hands for taps, buttons, cutlery, and/or writing: {ACTIONS;DENIES/REPORTS:21021675::"Denies"}  No Rheumatology ROS completed.   PMFS History:  Patient Active Problem List   Diagnosis Date Noted   IBS (irritable bowel syndrome) 06/08/2021   Chronic diarrhea 06/08/2021   Irritable bowel syndrome with diarrhea 06/08/2021   Quadriceps weakness 03/19/2021   S/P total knee arthroplasty, right 12/18/2020   Morbid obesity ([place]) 02/08/2018   Pancytopenia ([place]) 04/09/2017   Other cirrhosis of liver ([place]) 04/09/2017   DM type 2 causing vascular disease ([place]) 04/06/2017   HTN (hypertension) 04/06/2017   OSA (obstructive sleep apnea) 04/06/2017   Asthma in adult 04/06/2017   Obesity (BMI 30-39.9) 04/06/2017   Primary osteoarthritis of both knees 10/26/2016   Primary osteoarthritis of both hands 10/26/2016   Primary osteoarthritis of both feet 10/26/2016   COPD (chronic obstructive pulmonary disease) ([place]) 09/27/2016   Hypothyroidism 09/27/2016   Vertebral compression fracture ([place]) 09/27/2016   Fibromyalgia 09/27/2016   Restless leg syndrome 09/27/2016   Lumbar radiculitis 10/01/2014   Nerve pain 08/30/2014   Lumbar stenosis with neurogenic  claudication 08/28/2014   Spondylosis of lumbar region without myelopathy or radiculopathy 08/23/2014   Lumbosacral stenosis with neurogenic claudication ([place]) 08/23/2014   Vaginal discharge 06/22/2014   Chest pain 06/21/2014   Spinal stenosis 08/23/2011   Obstructive sleep apnea 08/23/2011   Diabetes ([place]) 08/12/2010   CAD 08/12/2010   Chronic diastolic heart failure ([place]) 08/12/2010   Mixed hyperlipidemia 06/23/2009   OVERWEIGHT/OBESITY 06/23/2009   Essential hypertension, benign 06/23/2009   CHEST PAIN-UNSPECIFIED 06/23/2009    Past Medical History:  Diagnosis Date   Anemia    PMH: as a child   Arthritis    Asthma    CAD (coronary artery disease)    nonobstructive by cath, 6/08 (false positive Cardiolite) normal stress echo, 7/11   CHF (congestive heart failure) (HCC)    Chronic back pain    Chronic bronchitis (HCC)    Cirrhosis (HCC)    Complication of anesthesia    had an asthma attack when woke up from procedure   Complication of anesthesia    asthma attack with one surgery   COPD (chronic obstructive pulmonary disease) ([place])    Degenerative joint disease    Depression    Diabetes mellitus without complication (HCC)    DJD (degenerative joint disease)    Fatty liver    Fibromyalgia    GERD (gastroesophageal reflux disease)    Glaucoma    Headache(784.0)    Heart failure, diastolic, chronic (HCC)    Heart murmur    PMH:As a child only   Hernia of abdominal cavity    "upper and lower hernia"   History of hiatal hernia    HTN (hypertension)    Hypertension    Hypothyroidism  IBS (irritable bowel syndrome)    IDDM (insulin dependent diabetes mellitus)    Morbid obesity ([place])    Neuropathy    associated with diabetes   Obstructive sleep apnea    Pancreatitis    Pinched nerve in neck    Pneumonia    as a child   Pulmonary hypertension ([place])    Rheumatic fever    PMH: as a child   Seizures ([place])    PMH: only as a child   Seizures ([place])    in  childhood   Shortness of breath    Sleep apnea    Spinal stenosis    Stroke ([place])    12/09/2013    Family History  Problem Relation Age of Onset   Cancer Other    Heart failure Other    Colon cancer Brother    Past Surgical History:  Procedure Laterality Date   ABDOMINAL HYSTERECTOMY     ABDOMINAL HYSTERECTOMY     partial hysterectomy   APPENDECTOMY     BACK SURGERY     BACK SURGERY     disc surgery with complications and damage to right side   BILATERAL KNEE ARTHROSCOPY  2000, 2009   BIOPSY  06/16/2021   Procedure: BIOPSY;  Surgeon: Harvel Quale, MD;  [facility];  Service: Gastroenterology;;   CARDIAC CATHETERIZATION     2009   CATARACT EXTRACTION W/ INTRAOCULAR LENS  IMPLANT, BILATERAL     CHOLECYSTECTOMY  1994   CHOLECYSTECTOMY     COLONOSCOPY N/A 08/01/2013   Procedure: COLONOSCOPY;  Surgeon: Rogene Houston, MD;  [facility];  Service: Endoscopy;  Laterality: N/A;  930   COLONOSCOPY N/A 08/03/2017   Procedure: COLONOSCOPY;  Surgeon: Rogene Houston, MD;  [facility];  Service: Endoscopy;  Laterality: N/A;  12:00   CYST EXCISION     Left breast   ESOPHAGOGASTRODUODENOSCOPY (EGD) WITH PROPOFOL N/A 06/16/2021   Procedure: ESOPHAGOGASTRODUODENOSCOPY (EGD) WITH PROPOFOL;  Surgeon: Harvel Quale, MD;  [facility];  Service: Gastroenterology;  Laterality: N/A;  12:50   EXCISIONAL HEMORRHOIDECTOMY     EYE SURGERY  1967   EYE SURGERY     bilateral cataract removal with lens implants   EYE SURGERY     growth removed from right eye lid   EYE SURGERY     bilateral eye surgery age 55 t"to straighten eyes'   heel (other)  2005   HEEL SPUR SURGERY     Right heel - growth removal and rebuilding of heel   HEMORRHOID SURGERY     HERNIA REPAIR     KNEE ARTHROSCOPY Bilateral    LUMBAR LAMINECTOMY/DECOMPRESSION MICRODISCECTOMY Right 08/23/2014   Procedure: LUMBAR LAMINECTOMY/DECOMPRESSION MICRODISCECTOMY 1 LEVEL;   Surgeon: Charlie Pitter, MD;  Location: [place] [facility];  Service: Neurosurgery;  Laterality: Right;  LUMBAR LAMINECTOMY/DECOMPRESSION MICRODISCECTOMY 1 LEVEL LUMBAR 2-3   ovaries removed  2003   PARTIAL HYSTERECTOMY  1981   SPHINCTEROTOMY     spinahatomy  1992   TOTAL KNEE ARTHROPLASTY Right 11/05/2020   Procedure: RIGHT TOTAL KNEE ARTHROPLASTY;  Surgeon: Marybelle Killings, MD;  [facility];  Service: Orthopedics;  Laterality: Right;   TUBAL LIGATION  1975   TUBAL LIGATION     Social History   Social History Narrative   ** Merged History Encounter **       Regularly exercises. Full Time.    Immunization History  Administered Date(s) Administered  Influenza,inj,Quad PF,6+ Mos 08/26/2014   Pneumococcal Polysaccharide-23 08/26/2014     Objective: Vital Signs: There were no vitals taken for this visit.   Physical Exam   Musculoskeletal Exam: ***  CDAI Exam: CDAI Score: -- Patient Global: --; Provider Global: -- Swollen: --; Tender: -- Joint Exam 08/14/2021   No joint exam has been documented for this visit   There is currently no information documented on the homunculus. Go to the Rheumatology activity and complete the homunculus joint exam.  Investigation: No additional findings.  Imaging: No results found.  Recent Labs: Lab Results  Component Value Date   WBC 3.5 (L) 06/08/2021   HGB 12.4 06/08/2021   PLT 108 (L) 06/08/2021   NA 139 06/12/2021   K 3.5 06/12/2021   CL 113 (H) 06/12/2021   CO2 22 06/12/2021   GLUCOSE 79 06/12/2021   BUN 19 06/12/2021   CREATININE 0.87 06/12/2021   BILITOT 1.4 (H) 06/08/2021   ALKPHOS 79 10/29/2020   AST 47 (H) 06/08/2021   ALT 28 06/08/2021   PROT 6.2 06/08/2021   ALBUMIN 3.4 (L) 10/29/2020   CALCIUM 7.7 (L) 06/12/2021   GFRAA 86 10/27/2018    Speciality Comments: No specialty comments available.  Procedures:  No procedures performed Allergies: Cinnamon, Hydrocodone, Naproxen, Other, Other, Oxycodone, Feldene  [piroxicam], Lyrica [pregabalin], Nsaids, Phenergan [promethazine hcl], Sulfa antibiotics, Latex, and Tape   Assessment / Plan:     Visit Diagnoses: Positive ANA (antinuclear antibody) - 06/08/21: ANA 1:160 nuclear, dense fine speckled, Actin antibody negative, mitochondrial M2 IgG negative  Fibromyalgia  Lumbosacral stenosis with neurogenic claudication (HCC)  Spondylosis of lumbar region without myelopathy or radiculopathy  Primary osteoarthritis of both hands  S/P total knee arthroplasty, right  Primary osteoarthritis of left knee  Primary osteoarthritis of both feet  Pancytopenia (HCC)  Restless leg syndrome  Mixed hyperlipidemia  Type 2 diabetes mellitus without complication, with long-term current use of insulin (HCC)  History of hypothyroidism  History of IBS  Other cirrhosis of liver (HCC)  History of COPD  OSA (obstructive sleep apnea)  Chronic diastolic heart failure ([place])  DM type 2 causing vascular disease ([place])  Essential hypertension, benign  Orders: No orders of the defined types were placed in this encounter.  No orders of the defined types were placed in this encounter.   Face-to-face time spent with patient was *** minutes. Greater than 50% of time was spent in counseling and coordination of care.  Follow-Up Instructions: No follow-ups on file.   Ofilia Neas, PA-C  Note - This record has been created using Dragon software.  Chart creation errors have been sought, but may not always  have been located. Such creation errors do not reflect on  the standard of medical care.

## 2021-08-04 ENCOUNTER — Encounter: Payer: Self-pay | Admitting: *Deleted

## 2021-08-05 ENCOUNTER — Telehealth: Payer: Self-pay | Admitting: Cardiology

## 2021-08-05 ENCOUNTER — Encounter: Payer: Self-pay | Admitting: Cardiology

## 2021-08-05 ENCOUNTER — Other Ambulatory Visit: Payer: Self-pay

## 2021-08-05 ENCOUNTER — Encounter: Payer: Self-pay | Admitting: *Deleted

## 2021-08-05 ENCOUNTER — Ambulatory Visit (INDEPENDENT_AMBULATORY_CARE_PROVIDER_SITE_OTHER): Payer: Medicare Other | Admitting: Cardiology

## 2021-08-05 VITALS — BP 126/68 | HR 62 | Ht 60.0 in | Wt 133.6 lb

## 2021-08-05 DIAGNOSIS — R079 Chest pain, unspecified: Secondary | ICD-10-CM

## 2021-08-05 DIAGNOSIS — I10 Essential (primary) hypertension: Secondary | ICD-10-CM | POA: Diagnosis not present

## 2021-08-05 NOTE — Patient Instructions (Addendum)
Medication Instructions:  Continue all current medications.  Labwork: none  Testing/Procedures: Your physician has requested that you have an exercise stress myoview. For further information please visit [email or web address]. Please follow instruction sheet, as given.  Office will contact with results via phone or letter.     Follow-Up: Follow up pending test results   Any Other Special Instructions Will Be Listed Below (If Applicable).   If you need a refill on your cardiac medications before your next appointment, please call your pharmacy.

## 2021-08-05 NOTE — Telephone Encounter (Signed)
Checking percert on the following patient for testing scheduled at [facility].     exercise stress myoview - cp   08/17/2021

## 2021-08-05 NOTE — Progress Notes (Signed)
Clinical Summary Ms. Mikula is a 65 y.o.female seen as a new patient, last seen in our office in 02/2017. Seen for the following medical problems.   1. Chest pain - history of false positive stress 04/2007, cath showed nonobstructive disease - negative stress echo 2012 - 06/2014 nuclear stress test no ischemia - 07/2016 echo: LVEF 55-60%, no WMAs       - asked to follow up with cardiology for recent chest pain  - recent chest pain symptoms - off and on about 1 year - sharp/squeezing upper left chest, can occur at rest or with activity. 3-7/10 in severity. No other associated symptoms. Pain lasts few seconds at times, longset 5-10 times. Occurs daily. Not positional -Fatigued and drained with activities, no specific SOB - different from her prior chest pains.   CAD risk factors: DM2 on insulin now off since weight loss surgery, HTN, HL  Past Medical History:  Diagnosis Date   Anemia    PMH: as a child   Arthritis    Asthma    CAD (coronary artery disease)    nonobstructive by cath, 6/08 (false positive Cardiolite) normal stress echo, 7/11   CHF (congestive heart failure) (HCC)    Chronic back pain    Chronic bronchitis (HCC)    Cirrhosis (HCC)    Complication of anesthesia    had an asthma attack when woke up from procedure   Complication of anesthesia    asthma attack with one surgery   COPD (chronic obstructive pulmonary disease) ([place])    Degenerative joint disease    Depression    Diabetes mellitus without complication (HCC)    DJD (degenerative joint disease)    Fatty liver    Fibromyalgia    GERD (gastroesophageal reflux disease)    Glaucoma    Headache(784.0)    Heart failure, diastolic, chronic (HCC)    Heart murmur    PMH:As a child only   Hernia of abdominal cavity    "upper and lower hernia"   History of hiatal hernia    HTN (hypertension)    Hypertension    Hypothyroidism    IBS (irritable bowel syndrome)    IDDM (insulin dependent diabetes  mellitus)    Morbid obesity (HCC)    Neuropathy    associated with diabetes   Obstructive sleep apnea    Pancreatitis    Pinched nerve in neck    Pneumonia    as a child   Pulmonary hypertension ([place])    Rheumatic fever    PMH: as a child   Seizures ([place])    PMH: only as a child   Seizures ([place])    in childhood   Shortness of breath    Sleep apnea    Spinal stenosis    Stroke ([place])    12/09/2013     Allergies  Allergen Reactions   Cinnamon Anaphylaxis   Hydrocodone Other (See Comments)    Over sedation   Naproxen Anaphylaxis and Other (See Comments)    Throat swelling   Other Shortness Of Breath and Other (See Comments)    Local anesthesia Had asthma attack   Other Shortness Of Breath and Other (See Comments)    Pt states a sedative agent used prior to a surgical procedure ( Pt states it "sounds like Phenergan. It should be in my records from [place].") caused an asthma attack.    Oxycodone Other (See Comments)    Over sedation   Feldene [Piroxicam] Nausea  Only   Lyrica [Pregabalin] Other (See Comments)    Altered mental status for prolonged period   Nsaids     Avoid due to history of gastic bypass   Phenergan [Promethazine Hcl] Other (See Comments)    triggers asthma attacks   Sulfa Antibiotics Other (See Comments)    (Yeast infection, per patient.)   Latex Rash and Other (See Comments)    "Blistery" rash.    Tape Rash and Other (See Comments)    Do not use adhesive tape; okay to use paper tape.     Current Outpatient Medications  Medication Sig Dispense Refill   albuterol (VENTOLIN HFA) 108 (90 Base) MCG/ACT inhaler Inhale 2 puffs into the lungs 4 (four) times daily as needed for shortness of breath.     atorvastatin (LIPITOR) 10 MG tablet Take 10 mg by mouth daily.     benzonatate (TESSALON) 100 MG capsule Take 1 capsule (100 mg total) by mouth every 8 (eight) hours. 21 capsule 0   BREO ELLIPTA 200-25 MCG/INH AEPB Inhale 2 puffs into the lungs daily.      Calcium Carb-Cholecalciferol (CALCIUM 500+D PO) Take 1 tablet by mouth in the morning and at bedtime.     Cholecalciferol (VITAMIN D) 50 MCG (2000 UT) tablet Take 2,000 Units by mouth daily.     Cyanocobalamin (VITAMIN B-12) 5000 MCG SUBL Place 5,000 mcg under the tongue every other day.     enoxaparin (LOVENOX) 30 MG/0.3ML injection Inject 0.3 mLs (30 mg total) into the skin every 12 (twelve) hours for 21 days. Must use x 3 weeks for postop DVT prophylaxis (Patient taking differently: Inject 30 mg into the skin every 28 (twenty-eight) days.) 12.6 mL 0   famotidine (PEPCID) 40 MG tablet Take 40 mg by mouth every morning.     ferrous sulfate 325 (65 FE) MG EC tablet Take 325 mg by mouth daily with breakfast.     furosemide (LASIX) 20 MG tablet Take 20 mg by mouth 2 (two) times daily.     Homeopathic Products (SLEEP CALM SLEEP RELIEF SL) Place 1 tablet under the tongue at bedtime as needed (sleep).     hyoscyamine (LEVSIN) 0.125 MG tablet Take 1 tablet (0.125 mg total) by mouth every 8 (eight) hours as needed. 90 tablet 5   levothyroxine (SYNTHROID) 112 MCG tablet Take 112 mcg by mouth daily before breakfast.     lidocaine (XYLOCAINE) 2 % solution Use as directed 15 mLs in the mouth or throat every 4 (four) hours as needed for mouth pain. 200 mL 0   Multiple Vitamin (MULTIVITAMIN PO) Take 2 tablets by mouth daily. ADEK vitamin chewable     multivitamin-lutein (OCUVITE-LUTEIN) CAPS capsule Take 1 capsule by mouth daily.     olopatadine (PATANOL) 0.1 % ophthalmic solution Place 1 drop into both eyes daily.     pantoprazole (PROTONIX) 40 MG tablet Take 40 mg by mouth daily.     spironolactone (ALDACTONE) 25 MG tablet Take 25 mg by mouth daily.     Zinc 15 MG CAPS Take 15 mg by mouth daily.     No current facility-administered medications for this visit.     Past Surgical History:  Procedure Laterality Date   ABDOMINAL HYSTERECTOMY     ABDOMINAL HYSTERECTOMY     partial hysterectomy    APPENDECTOMY     BACK SURGERY     BACK SURGERY     disc surgery with complications and damage to right side   BILATERAL KNEE  ARTHROSCOPY  2000, 2009   BIOPSY  06/16/2021   Procedure: BIOPSY;  Surgeon: Montez Morita, Quillian Quince, MD;  [facility];  Service: Gastroenterology;;   CARDIAC CATHETERIZATION     2009   CATARACT EXTRACTION W/ INTRAOCULAR LENS  IMPLANT, BILATERAL     CHOLECYSTECTOMY  1994   CHOLECYSTECTOMY     COLONOSCOPY N/A 08/01/2013   Procedure: COLONOSCOPY;  Surgeon: Rogene Houston, MD;  [facility];  Service: Endoscopy;  Laterality: N/A;  930   COLONOSCOPY N/A 08/03/2017   Procedure: COLONOSCOPY;  Surgeon: Rogene Houston, MD;  [facility];  Service: Endoscopy;  Laterality: N/A;  12:00   CYST EXCISION     Left breast   ESOPHAGOGASTRODUODENOSCOPY (EGD) WITH PROPOFOL N/A 06/16/2021   Procedure: ESOPHAGOGASTRODUODENOSCOPY (EGD) WITH PROPOFOL;  Surgeon: Harvel Quale, MD;  [facility];  Service: Gastroenterology;  Laterality: N/A;  12:50   EXCISIONAL HEMORRHOIDECTOMY     EYE SURGERY  1967   EYE SURGERY     bilateral cataract removal with lens implants   EYE SURGERY     growth removed from right eye lid   EYE SURGERY     bilateral eye surgery age 72 t"to straighten eyes'   heel (other)  2005   HEEL SPUR SURGERY     Right heel - growth removal and rebuilding of heel   HEMORRHOID SURGERY     HERNIA REPAIR     KNEE ARTHROSCOPY Bilateral    LUMBAR LAMINECTOMY/DECOMPRESSION MICRODISCECTOMY Right 08/23/2014   Procedure: LUMBAR LAMINECTOMY/DECOMPRESSION MICRODISCECTOMY 1 LEVEL;  Surgeon: Charlie Pitter, MD;  Location: [facility];  Service: Neurosurgery;  Laterality: Right;  LUMBAR LAMINECTOMY/DECOMPRESSION MICRODISCECTOMY 1 LEVEL LUMBAR 2-3   ovaries removed  2003   PARTIAL HYSTERECTOMY  1981   SPHINCTEROTOMY     spinahatomy  1992   TOTAL KNEE ARTHROPLASTY Right 11/05/2020   Procedure: RIGHT TOTAL KNEE ARTHROPLASTY;   Surgeon: Marybelle Killings, MD;  Location: [facility];  Service: Orthopedics;  Laterality: Right;   TUBAL LIGATION  1975   TUBAL LIGATION       Allergies  Allergen Reactions   Cinnamon Anaphylaxis   Hydrocodone Other (See Comments)    Over sedation   Naproxen Anaphylaxis and Other (See Comments)    Throat swelling   Other Shortness Of Breath and Other (See Comments)    Local anesthesia Had asthma attack   Other Shortness Of Breath and Other (See Comments)    Pt states a sedative agent used prior to a surgical procedure ( Pt states it "sounds like Phenergan. It should be in my records from [place].") caused an asthma attack.    Oxycodone Other (See Comments)    Over sedation   Feldene [Piroxicam] Nausea Only   Lyrica [Pregabalin] Other (See Comments)    Altered mental status for prolonged period   Nsaids     Avoid due to history of gastic bypass   Phenergan [Promethazine Hcl] Other (See Comments)    triggers asthma attacks   Sulfa Antibiotics Other (See Comments)    (Yeast infection, per patient.)   Latex Rash and Other (See Comments)    "Blistery" rash.    Tape Rash and Other (See Comments)    Do not use adhesive tape; okay to use paper tape.      Family History  Problem Relation Age of Onset   Cancer Other    Heart failure Other    Colon cancer Brother  Social History Ms. Bracher reports that she has never smoked. She has never used smokeless tobacco. Ms. Marland reports no history of alcohol use.   Review of Systems CONSTITUTIONAL: No weight loss, fever, chills, weakness or fatigue.  HEENT: Eyes: No visual loss, blurred vision, double vision or yellow sclerae.No hearing loss, sneezing, congestion, runny nose or sore throat.  SKIN: No rash or itching.  CARDIOVASCULAR: per hpi RESPIRATORY: No shortness of breath, cough or sputum.  GASTROINTESTINAL: No anorexia, nausea, vomiting or diarrhea. No abdominal pain or blood.  GENITOURINARY: No burning on urination, no  polyuria NEUROLOGICAL: No headache, dizziness, syncope, paralysis, ataxia, numbness or tingling in the extremities. No change in bowel or bladder control.  MUSCULOSKELETAL: No muscle, back pain, joint pain or stiffness.  LYMPHATICS: No enlarged nodes. No history of splenectomy.  PSYCHIATRIC: No history of depression or anxiety.  ENDOCRINOLOGIC: No reports of sweating, cold or heat intolerance. No polyuria or polydipsia.  Marland Kitchen   Physical Examination Today's Vitals   08/05/21 1451  BP: 126/68  Pulse: 62  SpO2: 99%  Weight: 133 lb 9.6 oz (60.6 kg)  Height: 5' (1.524 m)   Body mass index is 26.09 kg/m.  Gen: resting comfortably, no acute distress HEENT: no scleral icterus, pupils equal round and reactive, no palptable cervical adenopathy,  CV: RRR, no m/r/g no jvd Resp: Clear to auscultation bilaterally GI: abdomen is soft, non-tender, non-distended, normal bowel sounds, no hepatosplenomegaly MSK: extremities are warm, no edema.  Skin: warm, no rash Neuro:  no focal deficits Psych: appropriate affect   Diagnostic Studies  04/2007 cath HEMODYNAMIC RESULTS:  Aorta 141/77 mmHg.  Left ventricle 141/16 mmHg.    ANGIOGRAPHIC FINDINGS:  1. Left main coronary artery is medium in caliber and gives rise to      left anterior descending, ramus intermedius and circumflex vessels.      There is no significant flow-limiting coronary atherosclerosis      noted.  2. The left anterior descending is a medium caliber vessel that tapers      towards the apex.  This vessel gives rise to a fairly large      proximal diagonal Xian Apostol and a smaller distal diagonal Pinchos Topel.      Flow was TIMI III throughout the vessel and is no significant flow-      limiting coronary atherosclerosis noted.  3. There is a small to medium caliber ramus intermedius without      significant flow-limiting coronary atherosclerosis.  4. The circumflex coronary artery is a medium to large caliber vessel      with four obtuse  marginal branches, the third of which is the      largest and bifurcates.  There are minor luminal irregularities      without flow-limiting coronary atherosclerosis.  5. The right coronary artery is a medium caliber dominant vessel.      There are minor luminal irregularities without significant flow-      limiting coronary sclerosis.  6. Left ventriculography was performed in the RAO projection and      revealed ejection fraction of approximately 55% without significant      wall motion abnormality or significant mitral regurgitation.    DIAGNOSES:  1. No significant flow-limiting coronary atherosclerosis noted within      major epicardial vessels.  There is some distal tapering of the      left anterior descending, although flow is TIMI III.  2. Left ventricular ejection fraction approximately 55% without  significant wall motion abnormality.  No significant mitral      regurgitation.  The left ventricle end-diastolic pressure of 16      mmHg.    DISCUSSION:  I reviewed results with the patient and with Dr. Dannielle Burn by  phone.  At this point will plan overnight observation.  Will follow up a  full set of cardiac markers as well as her electrocardiogram.  Anticipate a contrasted CT scan of the chest tomorrow to exclude  pulmonary embolus, dissection and mass.  Continue proton pump inhibitor.  If her CT scan of the chest is reassuring, may be able to continue  medical therapy with plans for risk factor modification.  One wonders  about the possibility of microvascular angina.  Further plans to follow.   Assessment and Plan     1. Chest pain - long history of chest pain with negative ischemic evaluations.  - recent symptoms different from prior. Multiple CAD risk factors including diabetes, though since bariatric surgery has been able to come off most of her meds. Has been 7 years since last stress testing.  - plan for exercise nuclear stress test.   EKG today shows SR, no  ischemic changes  Arnoldo Lenis, M.D.,

## 2021-08-14 ENCOUNTER — Ambulatory Visit: Payer: Medicare Other | Admitting: Rheumatology

## 2021-08-14 DIAGNOSIS — M797 Fibromyalgia: Secondary | ICD-10-CM

## 2021-08-14 DIAGNOSIS — E119 Type 2 diabetes mellitus without complications: Secondary | ICD-10-CM

## 2021-08-14 DIAGNOSIS — Z96651 Presence of right artificial knee joint: Secondary | ICD-10-CM

## 2021-08-14 DIAGNOSIS — Z8709 Personal history of other diseases of the respiratory system: Secondary | ICD-10-CM

## 2021-08-14 DIAGNOSIS — M19041 Primary osteoarthritis, right hand: Secondary | ICD-10-CM

## 2021-08-14 DIAGNOSIS — G2581 Restless legs syndrome: Secondary | ICD-10-CM

## 2021-08-14 DIAGNOSIS — D61818 Other pancytopenia: Secondary | ICD-10-CM

## 2021-08-14 DIAGNOSIS — E1159 Type 2 diabetes mellitus with other circulatory complications: Secondary | ICD-10-CM

## 2021-08-14 DIAGNOSIS — G9519 Other vascular myelopathies: Secondary | ICD-10-CM

## 2021-08-14 DIAGNOSIS — I10 Essential (primary) hypertension: Secondary | ICD-10-CM

## 2021-08-14 DIAGNOSIS — I5032 Chronic diastolic (congestive) heart failure: Secondary | ICD-10-CM

## 2021-08-14 DIAGNOSIS — G4733 Obstructive sleep apnea (adult) (pediatric): Secondary | ICD-10-CM

## 2021-08-14 DIAGNOSIS — K7469 Other cirrhosis of liver: Secondary | ICD-10-CM

## 2021-08-14 DIAGNOSIS — Z8639 Personal history of other endocrine, nutritional and metabolic disease: Secondary | ICD-10-CM

## 2021-08-14 DIAGNOSIS — E782 Mixed hyperlipidemia: Secondary | ICD-10-CM

## 2021-08-14 DIAGNOSIS — Z8719 Personal history of other diseases of the digestive system: Secondary | ICD-10-CM

## 2021-08-14 DIAGNOSIS — M47816 Spondylosis without myelopathy or radiculopathy, lumbar region: Secondary | ICD-10-CM

## 2021-08-14 DIAGNOSIS — R768 Other specified abnormal immunological findings in serum: Secondary | ICD-10-CM

## 2021-08-14 DIAGNOSIS — M1712 Unilateral primary osteoarthritis, left knee: Secondary | ICD-10-CM

## 2021-08-14 DIAGNOSIS — H353231 Exudative age-related macular degeneration, bilateral, with active choroidal neovascularization: Secondary | ICD-10-CM | POA: Diagnosis not present

## 2021-08-14 DIAGNOSIS — H43823 Vitreomacular adhesion, bilateral: Secondary | ICD-10-CM | POA: Diagnosis not present

## 2021-08-14 DIAGNOSIS — M19071 Primary osteoarthritis, right ankle and foot: Secondary | ICD-10-CM

## 2021-08-17 ENCOUNTER — Ambulatory Visit (HOSPITAL_COMMUNITY)
Admission: RE | Admit: 2021-08-17 | Discharge: 2021-08-17 | Disposition: A | Discharge status: Home / Self Care | Payer: Medicare Other | Source: Ambulatory Visit | Attending: Cardiology | Admitting: Cardiology

## 2021-08-17 ENCOUNTER — Other Ambulatory Visit: Payer: Self-pay

## 2021-08-17 ENCOUNTER — Encounter (HOSPITAL_COMMUNITY)
Admission: RE | Admit: 2021-08-17 | Discharge: 2021-08-17 | Disposition: A | Discharge status: Home / Self Care | Payer: Medicare Other | Source: Ambulatory Visit | Attending: Cardiology | Admitting: Cardiology

## 2021-08-17 DIAGNOSIS — R079 Chest pain, unspecified: Secondary | ICD-10-CM | POA: Insufficient documentation

## 2021-08-17 LAB — NM MYOCAR MULTI W/SPECT W/WALL MOTION / EF
LV dias vol: 62 mL (ref 46–106)
LV sys vol: 21 mL
MPHR: 156 {beats}/min
Nuc Stress EF: 66 %
RATE: 0.4
Rest HR: 54 {beats}/min
Rest Nuclear Isotope Dose: 10.5 mCi
SDS: 1
SRS: 0
SSS: 1
ST Depression (mm): 0 mm
Stress Nuclear Isotope Dose: 30 mCi
TID: 1.02

## 2021-08-17 MED ORDER — SODIUM CHLORIDE FLUSH 0.9 % IV SOLN
INTRAVENOUS | Status: AC
  Administered 2021-08-17: 10 mL via INTRAVENOUS
  Filled 2021-08-17: qty 10

## 2021-08-17 MED ORDER — TECHNETIUM TC 99M TETROFOSMIN IV KIT
30.0000 | PACK | Freq: Once | INTRAVENOUS | Status: AC | PRN
  Administered 2021-08-17: 30 via INTRAVENOUS

## 2021-08-17 MED ORDER — TECHNETIUM TC 99M TETROFOSMIN IV KIT
10.0000 | PACK | Freq: Once | INTRAVENOUS | Status: AC | PRN
  Administered 2021-08-17: 10.45 via INTRAVENOUS

## 2021-08-17 MED ORDER — REGADENOSON 0.4 MG/5ML IV SOLN
INTRAVENOUS | Status: AC
  Administered 2021-08-17: 0.4 mg via INTRAVENOUS
  Filled 2021-08-17: qty 5

## 2021-08-20 ENCOUNTER — Ambulatory Visit (INDEPENDENT_AMBULATORY_CARE_PROVIDER_SITE_OTHER): Payer: Medicare Other | Admitting: Orthopaedic Surgery

## 2021-08-20 ENCOUNTER — Encounter: Payer: Self-pay | Admitting: Orthopaedic Surgery

## 2021-08-20 ENCOUNTER — Other Ambulatory Visit: Payer: Self-pay

## 2021-08-20 VITALS — Ht 60.0 in | Wt 134.2 lb

## 2021-08-20 DIAGNOSIS — Z96651 Presence of right artificial knee joint: Secondary | ICD-10-CM

## 2021-08-20 DIAGNOSIS — M6281 Muscle weakness (generalized): Secondary | ICD-10-CM

## 2021-08-20 NOTE — Progress Notes (Signed)
Office Visit Note   Patient: Kimberly [name]           Date of Birth: 1956/07/16           MRN: [id number] Visit Date: 08/20/2021              Requested by: Glenda Chroman, MD [place],  [place] [id number] PCP: Glenda Chroman, MD   Assessment & Plan: Visit Diagnoses:  1. Quadriceps weakness   2. S/P total knee arthroplasty, right     Plan: Patient has left knee osteoarthritis she still needs more work on quad strengthening before she considers left total knee arthroplasty.  Its been a year and a half since her right knee arthroplasty and she still has quad weakness and still lacks 5 degrees reaching extension.  We went over what she needs in order to achieve and correct this problem.  Recheck 6 months to discuss left total knee arthroplasty.  Follow-Up Instructions: Return in about 6 months (around 02/17/2022).   Orders:  No orders of the defined types were placed in this encounter.  No orders of the defined types were placed in this encounter.     Procedures: No procedures performed   Clinical Data: No additional findings.   Subjective: Chief Complaint  Patient presents with   Right Knee - Follow-up    11/05/2020 Right TKA    Follow-up right knee arthroplasty she has left knee osteoarthritis still has quad weakness bilaterally.  Prone positioning she is trying to get the last 5 degrees of extension but after 5 minutes she starts shaking some with hamstring tremors and then quits.  She is icing her knee repetitively during the day and doing the prone positioning but never makes it more than 5 minutes.  She is walking better but still has quad weakness bilaterally.  Review of Systems all other systems updated unchanged.  She had previous compression fracture.   Objective: Vital Signs: Ht 5' (1.524 m)   Wt 134 lb 3.2 oz (60.9 kg)   BMI 26.21 kg/m   Physical Exam Constitutional:      Appearance: She is well-developed.  HENT:     Head: Normocephalic.      Right Ear: External ear normal.     Left Ear: External ear normal. There is no impacted cerumen.  Eyes:     Pupils: Pupils are equal, round, and reactive to light.  Neck:     Thyroid: No thyromegaly.     Trachea: No tracheal deviation.  Cardiovascular:     Rate and Rhythm: Normal rate.  Pulmonary:     Effort: Pulmonary effort is normal.  Abdominal:     Palpations: Abdomen is soft.  Musculoskeletal:     Cervical back: No rigidity.  Skin:    General: Skin is warm and dry.  Neurological:     Mental Status: She is alert and oriented to person, place, and time.  Psychiatric:        Behavior: Behavior normal.    Ortho Exam 5-110 degrees right knee range of motion.  Still does a hop step with either right or left leg going up on a single stance.  She needs to continue with quad strengthening.   Specialty Comments:  No specialty comments available.  Imaging: No results found.   PMFS History: Patient Active Problem List   Diagnosis Date Noted   IBS (irritable bowel syndrome) 06/08/2021   Chronic diarrhea 06/08/2021   Irritable bowel syndrome with diarrhea  06/08/2021   Quadriceps weakness 03/19/2021   S/P total knee arthroplasty, right 12/18/2020   Morbid obesity ([place]) 02/08/2018   Pancytopenia ([place]) 04/09/2017   Other cirrhosis of liver ([place]) 04/09/2017   DM type 2 causing vascular disease ([place]) 04/06/2017   HTN (hypertension) 04/06/2017   OSA (obstructive sleep apnea) 04/06/2017   Asthma in adult 04/06/2017   Obesity (BMI 30-39.9) 04/06/2017   Primary osteoarthritis of both knees 10/26/2016   Primary osteoarthritis of both hands 10/26/2016   Primary osteoarthritis of both feet 10/26/2016   COPD (chronic obstructive pulmonary disease) ([place]) 09/27/2016   Hypothyroidism 09/27/2016   Vertebral compression fracture ([place]) 09/27/2016   Fibromyalgia 09/27/2016   Restless leg syndrome 09/27/2016   Lumbar radiculitis 10/01/2014   Nerve pain 08/30/2014   Lumbar stenosis with  neurogenic claudication 08/28/2014   Spondylosis of lumbar region without myelopathy or radiculopathy 08/23/2014   Lumbosacral stenosis with neurogenic claudication ([place]) 08/23/2014   Vaginal discharge 06/22/2014   Chest pain 06/21/2014   Spinal stenosis 08/23/2011   Obstructive sleep apnea 08/23/2011   Diabetes ([place]) 08/12/2010   CAD 08/12/2010   Chronic diastolic heart failure ([place]) 08/12/2010   Mixed hyperlipidemia 06/23/2009   OVERWEIGHT/OBESITY 06/23/2009   Essential hypertension, benign 06/23/2009   CHEST PAIN-UNSPECIFIED 06/23/2009   Past Medical History:  Diagnosis Date   Anemia    PMH: as a child   Arthritis    Asthma    CAD (coronary artery disease)    nonobstructive by cath, 6/08 (false positive Cardiolite) normal stress echo, 7/11   CHF (congestive heart failure) (HCC)    Chronic back pain    Chronic bronchitis (HCC)    Cirrhosis (HCC)    Complication of anesthesia    had an asthma attack when woke up from procedure   Complication of anesthesia    asthma attack with one surgery   COPD (chronic obstructive pulmonary disease) ([place])    Degenerative joint disease    Depression    Diabetes mellitus without complication (HCC)    DJD (degenerative joint disease)    Fatty liver    Fibromyalgia    GERD (gastroesophageal reflux disease)    Glaucoma    Headache(784.0)    Heart failure, diastolic, chronic (HCC)    Heart murmur    PMH:As a child only   Hernia of abdominal cavity    "upper and lower hernia"   History of hiatal hernia    HTN (hypertension)    Hypertension    Hypothyroidism    IBS (irritable bowel syndrome)    IDDM (insulin dependent diabetes mellitus)    Morbid obesity ([place])    Neuropathy    associated with diabetes   Obstructive sleep apnea    Pancreatitis    Pinched nerve in neck    Pneumonia    as a child   Pulmonary hypertension ([place])    Rheumatic fever    PMH: as a child   Seizures ([place])    PMH: only as a child   Seizures ([place])     in childhood   Shortness of breath    Sleep apnea    Spinal stenosis    Stroke ([place])    12/09/2013    Family History  Problem Relation Age of Onset   Cancer Other    Heart failure Other    Colon cancer Brother     Past Surgical History:  Procedure Laterality Date   ABDOMINAL HYSTERECTOMY     ABDOMINAL HYSTERECTOMY  partial hysterectomy   APPENDECTOMY     BACK SURGERY     BACK SURGERY     disc surgery with complications and damage to right side   BILATERAL KNEE ARTHROSCOPY  2000, 2009   BIOPSY  06/16/2021   Procedure: BIOPSY;  Surgeon: Montez Morita, Quillian Quince, MD;  [facility];  Service: Gastroenterology;;   CARDIAC CATHETERIZATION     2009   CATARACT EXTRACTION W/ INTRAOCULAR LENS  IMPLANT, BILATERAL     CHOLECYSTECTOMY  1994   CHOLECYSTECTOMY     COLONOSCOPY N/A 08/01/2013   Procedure: COLONOSCOPY;  Surgeon: Rogene Houston, MD;  [facility];  Service: Endoscopy;  Laterality: N/A;  930   COLONOSCOPY N/A 08/03/2017   Procedure: COLONOSCOPY;  Surgeon: Rogene Houston, MD;  [facility];  Service: Endoscopy;  Laterality: N/A;  12:00   CYST EXCISION     Left breast   ESOPHAGOGASTRODUODENOSCOPY (EGD) WITH PROPOFOL N/A 06/16/2021   Procedure: ESOPHAGOGASTRODUODENOSCOPY (EGD) WITH PROPOFOL;  Surgeon: Harvel Quale, MD;  [facility];  Service: Gastroenterology;  Laterality: N/A;  12:50   EXCISIONAL HEMORRHOIDECTOMY     EYE SURGERY  1967   EYE SURGERY     bilateral cataract removal with lens implants   EYE SURGERY     growth removed from right eye lid   EYE SURGERY     bilateral eye surgery age 41 t"to straighten eyes'   heel (other)  2005   HEEL SPUR SURGERY     Right heel - growth removal and rebuilding of heel   HEMORRHOID SURGERY     HERNIA REPAIR     KNEE ARTHROSCOPY Bilateral    LUMBAR LAMINECTOMY/DECOMPRESSION MICRODISCECTOMY Right 08/23/2014   Procedure: LUMBAR LAMINECTOMY/DECOMPRESSION MICRODISCECTOMY 1  LEVEL;  Surgeon: Charlie Pitter, MD;  Location: [facility];  Service: Neurosurgery;  Laterality: Right;  LUMBAR LAMINECTOMY/DECOMPRESSION MICRODISCECTOMY 1 LEVEL LUMBAR 2-3   ovaries removed  2003   PARTIAL HYSTERECTOMY  1981   SPHINCTEROTOMY     spinahatomy  1992   TOTAL KNEE ARTHROPLASTY Right 11/05/2020   Procedure: RIGHT TOTAL KNEE ARTHROPLASTY;  Surgeon: Marybelle Killings, MD;  [facility];  Service: Orthopedics;  Laterality: Right;   TUBAL LIGATION  1975   TUBAL LIGATION     Social History   Occupational History   Not on file  Tobacco Use   Smoking status: Never   Smokeless tobacco: Never  Vaping Use   Vaping Use: Never used  Substance and Sexual Activity   Alcohol use: No   Drug use: No   Sexual activity: Not on file

## 2021-08-23 DIAGNOSIS — J449 Chronic obstructive pulmonary disease, unspecified: Secondary | ICD-10-CM | POA: Diagnosis not present

## 2021-09-01 ENCOUNTER — Other Ambulatory Visit: Payer: Self-pay

## 2021-09-01 ENCOUNTER — Ambulatory Visit
Admission: RE | Admit: 2021-09-01 | Discharge: 2021-09-01 | Disposition: A | Discharge status: Home / Self Care | Payer: Medicare Other | Source: Ambulatory Visit | Attending: Internal Medicine | Admitting: Internal Medicine

## 2021-09-01 ENCOUNTER — Telehealth: Payer: Self-pay | Admitting: *Deleted

## 2021-09-01 DIAGNOSIS — Z139 Encounter for screening, unspecified: Secondary | ICD-10-CM

## 2021-09-01 DIAGNOSIS — Z1231 Encounter for screening mammogram for malignant neoplasm of breast: Secondary | ICD-10-CM | POA: Diagnosis not present

## 2021-09-01 NOTE — Telephone Encounter (Signed)
Laurine Blazer, LPN  61/[id number]/[id number]  9:97 PM EDT Back to Top    Notified, copy to pcp.

## 2021-09-01 NOTE — Telephone Encounter (Signed)
-----   Message from Arnoldo Lenis, MD sent at 08/31/2021 10:29 AM EDT ----- Normal stress test, no evidence of any significant blockages   Zandra Abts MD

## 2021-09-02 ENCOUNTER — Encounter: Payer: Self-pay | Admitting: *Deleted

## 2021-09-04 ENCOUNTER — Ambulatory Visit: Payer: Medicare Other | Admitting: Rheumatology

## 2021-09-21 DIAGNOSIS — H353231 Exudative age-related macular degeneration, bilateral, with active choroidal neovascularization: Secondary | ICD-10-CM | POA: Diagnosis not present

## 2021-09-21 DIAGNOSIS — E113293 Type 2 diabetes mellitus with mild nonproliferative diabetic retinopathy without macular edema, bilateral: Secondary | ICD-10-CM | POA: Diagnosis not present

## 2021-09-21 DIAGNOSIS — H35033 Hypertensive retinopathy, bilateral: Secondary | ICD-10-CM | POA: Diagnosis not present

## 2021-09-21 DIAGNOSIS — H43823 Vitreomacular adhesion, bilateral: Secondary | ICD-10-CM | POA: Diagnosis not present

## 2021-09-23 DIAGNOSIS — J449 Chronic obstructive pulmonary disease, unspecified: Secondary | ICD-10-CM | POA: Diagnosis not present

## 2021-09-28 ENCOUNTER — Ambulatory Visit (INDEPENDENT_AMBULATORY_CARE_PROVIDER_SITE_OTHER): Payer: Medicare Other | Admitting: Gastroenterology

## 2021-10-16 DIAGNOSIS — I10 Essential (primary) hypertension: Secondary | ICD-10-CM | POA: Diagnosis not present

## 2021-10-16 DIAGNOSIS — M25511 Pain in right shoulder: Secondary | ICD-10-CM | POA: Diagnosis not present

## 2021-10-16 DIAGNOSIS — Z299 Encounter for prophylactic measures, unspecified: Secondary | ICD-10-CM | POA: Diagnosis not present

## 2021-10-19 DIAGNOSIS — H353231 Exudative age-related macular degeneration, bilateral, with active choroidal neovascularization: Secondary | ICD-10-CM | POA: Diagnosis not present

## 2021-10-19 DIAGNOSIS — H43823 Vitreomacular adhesion, bilateral: Secondary | ICD-10-CM | POA: Diagnosis not present

## 2021-10-23 DIAGNOSIS — J449 Chronic obstructive pulmonary disease, unspecified: Secondary | ICD-10-CM | POA: Diagnosis not present

## 2021-11-17 DIAGNOSIS — H43821 Vitreomacular adhesion, right eye: Secondary | ICD-10-CM | POA: Diagnosis not present

## 2021-11-17 DIAGNOSIS — H353231 Exudative age-related macular degeneration, bilateral, with active choroidal neovascularization: Secondary | ICD-10-CM | POA: Diagnosis not present

## 2021-11-20 DIAGNOSIS — J029 Acute pharyngitis, unspecified: Secondary | ICD-10-CM | POA: Diagnosis not present

## 2021-11-20 DIAGNOSIS — Z6824 Body mass index (BMI) 24.0-24.9, adult: Secondary | ICD-10-CM | POA: Diagnosis not present

## 2021-11-20 DIAGNOSIS — R509 Fever, unspecified: Secondary | ICD-10-CM | POA: Diagnosis not present

## 2021-11-20 DIAGNOSIS — U071 COVID-19: Secondary | ICD-10-CM | POA: Diagnosis not present

## 2021-11-20 DIAGNOSIS — Z299 Encounter for prophylactic measures, unspecified: Secondary | ICD-10-CM | POA: Diagnosis not present

## 2021-11-23 DIAGNOSIS — J449 Chronic obstructive pulmonary disease, unspecified: Secondary | ICD-10-CM | POA: Diagnosis not present

## 2021-12-15 DIAGNOSIS — R69 Illness, unspecified: Secondary | ICD-10-CM | POA: Diagnosis not present

## 2021-12-15 DIAGNOSIS — H353231 Exudative age-related macular degeneration, bilateral, with active choroidal neovascularization: Secondary | ICD-10-CM | POA: Diagnosis not present

## 2021-12-15 DIAGNOSIS — H35033 Hypertensive retinopathy, bilateral: Secondary | ICD-10-CM | POA: Diagnosis not present

## 2021-12-15 DIAGNOSIS — E113293 Type 2 diabetes mellitus with mild nonproliferative diabetic retinopathy without macular edema, bilateral: Secondary | ICD-10-CM | POA: Diagnosis not present

## 2021-12-15 DIAGNOSIS — H43823 Vitreomacular adhesion, bilateral: Secondary | ICD-10-CM | POA: Diagnosis not present

## 2021-12-24 DIAGNOSIS — J449 Chronic obstructive pulmonary disease, unspecified: Secondary | ICD-10-CM | POA: Diagnosis not present

## 2021-12-27 DIAGNOSIS — M62830 Muscle spasm of back: Secondary | ICD-10-CM | POA: Diagnosis not present

## 2021-12-27 DIAGNOSIS — M25511 Pain in right shoulder: Secondary | ICD-10-CM | POA: Diagnosis not present

## 2021-12-27 DIAGNOSIS — I10 Essential (primary) hypertension: Secondary | ICD-10-CM | POA: Diagnosis not present

## 2021-12-27 DIAGNOSIS — K7460 Unspecified cirrhosis of liver: Secondary | ICD-10-CM | POA: Diagnosis not present

## 2021-12-27 DIAGNOSIS — M5408 Panniculitis affecting regions of neck and back, sacral and sacrococcygeal region: Secondary | ICD-10-CM | POA: Diagnosis not present

## 2021-12-27 DIAGNOSIS — E119 Type 2 diabetes mellitus without complications: Secondary | ICD-10-CM | POA: Diagnosis not present

## 2021-12-27 DIAGNOSIS — Z882 Allergy status to sulfonamides status: Secondary | ICD-10-CM | POA: Diagnosis not present

## 2021-12-27 DIAGNOSIS — Z91040 Latex allergy status: Secondary | ICD-10-CM | POA: Diagnosis not present

## 2021-12-27 DIAGNOSIS — M19011 Primary osteoarthritis, right shoulder: Secondary | ICD-10-CM | POA: Diagnosis not present

## 2021-12-28 DIAGNOSIS — J449 Chronic obstructive pulmonary disease, unspecified: Secondary | ICD-10-CM | POA: Diagnosis not present

## 2021-12-28 DIAGNOSIS — Z789 Other specified health status: Secondary | ICD-10-CM | POA: Diagnosis not present

## 2021-12-28 DIAGNOSIS — Z299 Encounter for prophylactic measures, unspecified: Secondary | ICD-10-CM | POA: Diagnosis not present

## 2021-12-28 DIAGNOSIS — I700 Atherosclerosis of aorta: Secondary | ICD-10-CM | POA: Diagnosis not present

## 2021-12-28 DIAGNOSIS — M549 Dorsalgia, unspecified: Secondary | ICD-10-CM | POA: Diagnosis not present

## 2021-12-28 DIAGNOSIS — I10 Essential (primary) hypertension: Secondary | ICD-10-CM | POA: Diagnosis not present

## 2022-01-05 ENCOUNTER — Telehealth (INDEPENDENT_AMBULATORY_CARE_PROVIDER_SITE_OTHER): Payer: Self-pay

## 2022-01-05 ENCOUNTER — Ambulatory Visit (INDEPENDENT_AMBULATORY_CARE_PROVIDER_SITE_OTHER): Payer: Medicare Other | Admitting: Gastroenterology

## 2022-01-05 ENCOUNTER — Other Ambulatory Visit: Payer: Self-pay

## 2022-01-05 ENCOUNTER — Encounter (INDEPENDENT_AMBULATORY_CARE_PROVIDER_SITE_OTHER): Payer: Self-pay | Admitting: Gastroenterology

## 2022-01-05 VITALS — BP 130/73 | HR 69 | Temp 97.7°F | Ht 60.0 in | Wt 141.1 lb

## 2022-01-05 DIAGNOSIS — K7581 Nonalcoholic steatohepatitis (NASH): Secondary | ICD-10-CM | POA: Diagnosis not present

## 2022-01-05 DIAGNOSIS — K580 Irritable bowel syndrome with diarrhea: Secondary | ICD-10-CM | POA: Diagnosis not present

## 2022-01-05 DIAGNOSIS — K219 Gastro-esophageal reflux disease without esophagitis: Secondary | ICD-10-CM | POA: Diagnosis not present

## 2022-01-05 DIAGNOSIS — K7460 Unspecified cirrhosis of liver: Secondary | ICD-10-CM | POA: Diagnosis not present

## 2022-01-05 MED ORDER — RIFAXIMIN 550 MG PO TABS: 550.0000 mg | ORAL_TABLET | Freq: Three times a day (TID) | ORAL | 0 refills | Status: AC

## 2022-01-05 NOTE — Telephone Encounter (Signed)
Xifaxan 550 #[id number] approved from 01/19/2022, Per [facility]: [id number]. I left a detailed message that approved that she may go pick up from the drug store.

## 2022-01-05 NOTE — Patient Instructions (Addendum)
Please follow up with your bariatric surgeon in regards to ongoing abdominal pain and diarrhea We will try a 2 week course of xifaxan to see if this helps your diarrhea. We will also update labs and Ultrasound in regards to your liver. Please continue taking lasix 20mg  twice a day and spironolactone 25mg  daily Please continue taking protonix 40mg , please take your famotidine(pepcid) in the evenings instead of the mornings, please let me know if symptoms do not improve on this regimen  Follow up in 6 months

## 2022-01-05 NOTE — Progress Notes (Signed)
Referring Provider: Glenda Chroman, MD Primary Care Physician:  Glenda Chroman, MD Primary GI Physician: Jenetta Downer  Chief Complaint  Patient presents with   Abdominal Pain    Patient here today with complaints of abdominal pain and diarrhea. Pain is on the left mid abdomin and radiates to her back. She also says she has been very tired lately. She has not seen any dark or bloody stools.    HPI:   Kimberly [name] is a 66 y.o. female with past medical history of compensated NASH cirrhosis, CHF, coronary artery disease, depression, diabetes, COPD, IBS-D, hypothyroidism, hypertension, OSA, stroke, seizures, obesity status post duodenal switch  Patient presenting today for abdominal pain.  Patient last seen July 2022 with intermittent abdominal cramping.  Abdominal pain/diarrhea:  advised to start Levsin Q8H at last visit with considerations of course of Xifaxan x2 weeks if no improvement in symptoms. She was advised to follow up with her bariatric surgeon as well.   Today she states that she has continued lower abdominal pain and diarrhea with fecal urgency, typically after eating. Unable to pinpoint how many episodes of diarrhea she has per day. She states that she was never able to follow up with her bariatric surgeon as recommended previously. She continues to have looser stools anytime she eats. She states that she wears a brief if she goes out because she has fecal urgency and sometimes cannot control it. She also reports she has issues with urinary incontinence as well. She denies any rectal bleeding or melena. She reports that lower abdominal pain is sometimes improved with lying In supine position. States that she got the levsin she was prescribed at last visit though she did not notice much improvement in her abdominal pain. She denies nausea or vomiting. She was previously on bentyl without results. She was never able to have SIBO breath test done because of having COVID.  Weight and  appetite stable.  GERD: She does endorse taking a lot of tums for her frequent indigestion. She notes acid regurgitation almost every night. She is taking protonix 47m once daily. She states that she was previously on tagamet which felt was one of the only things that helped her reflux and abdominal pain. Reports she has tried other PPIs in the past without results  Cirrhosis: last labs done in July 2022 AST 47, ALT 28, T bili 1.4, alk phos 77, inr 1, no evidence of iron overload. Acute hep neg, ANA of 1:160 though unlikely this is contributory to her liver disease as other autoimmune serologies negative, suspect cirrhosis secondary to NASH. [place]domen complete done 05/07/21.No masses seen, slightly enlarged spleen with tortuous splenic hilar vessels likely corresponding to spleno renal shunt. She states that legs feel heavy and she feels they have been more swollen recently. She feels that she has had some swelling on the L side of her abdomen at times, near previous surgical scar, though denies diffuse abdominal swelling. Reports some issues with confusion intermittently. Denies jaundice of pruritus.   She is maintained on lasix 263mBID and aldactone 2519maily  Previous MELD: 6  Cirrhosis related questions: Episodes of confusion/disorientation: reports some issues with confusion at times, not remembering why she is somewhere or how she got there. Taking diuretics?yes Beta blockers? no Prior history of variceal banding?  Prior episodes of SBP? no  Last liver imaging:June 2022 Alcohol use:no  Last Colonoscopy:08/03/17- Diverticulosis at the hepatic flexure. - Internal hemorrhoids. - No specimens collected. Last Endoscopy:06/16/21- Small  hiatal hernia. - Normal stomach. - Widely patent duodenal switch, characterized by healthy appearing mucosa was found. (Peptic duodenitis, no other alterations)   Recommendations:  Repeat colonoscopy sept 2023  Past Medical History:  Diagnosis Date    Anemia    PMH: as a child   Arthritis    Asthma    CAD (coronary artery disease)    nonobstructive by cath, 6/08 (false positive Cardiolite) normal stress echo, 7/11   CHF (congestive heart failure) (HCC)    Chronic back pain    Chronic bronchitis (HCC)    Cirrhosis ([place])    Complication of anesthesia    had an asthma attack when woke up from procedure   Complication of anesthesia    asthma attack with one surgery   COPD (chronic obstructive pulmonary disease) ([place])    Degenerative joint disease    Depression    Diabetes mellitus without complication ([place])    DJD (degenerative joint disease)    Fatty liver    Fibromyalgia    GERD (gastroesophageal reflux disease)    Glaucoma    Headache(784.0)    Heart failure, diastolic, chronic (HCC)    Heart murmur    PMH:As a child only   Hernia of abdominal cavity    "upper and lower hernia"   History of hiatal hernia    HTN (hypertension)    Hypertension    Hypothyroidism    IBS (irritable bowel syndrome)    IDDM (insulin dependent diabetes mellitus)    Morbid obesity ([place])    Neuropathy    associated with diabetes   Obstructive sleep apnea    Pancreatitis    Pinched nerve in neck    Pneumonia    as a child   Pulmonary hypertension ([place])    Rheumatic fever    PMH: as a child   Seizures ([place])    PMH: only as a child   Seizures ([place])    in childhood   Shortness of breath    Sleep apnea    Spinal stenosis    Stroke ([place])    12/09/2013    Past Surgical History:  Procedure Laterality Date   ABDOMINAL HYSTERECTOMY     ABDOMINAL HYSTERECTOMY     partial hysterectomy   APPENDECTOMY     BACK SURGERY     BACK SURGERY     disc surgery with complications and damage to right side   BILATERAL KNEE ARTHROSCOPY  2000, 2009   BIOPSY  06/16/2021   Procedure: BIOPSY;  Surgeon: Harvel Quale, MD;  [facility];  Service: Gastroenterology;;   BREAST CYST EXCISION Left    CARDIAC CATHETERIZATION     2009    CATARACT EXTRACTION W/ INTRAOCULAR LENS  IMPLANT, BILATERAL     CHOLECYSTECTOMY  11/22/1992   CHOLECYSTECTOMY     COLONOSCOPY N/A 08/01/2013   Procedure: COLONOSCOPY;  Surgeon: Rogene Houston, MD;  [facility];  Service: Endoscopy;  Laterality: N/A;  930   COLONOSCOPY N/A 08/03/2017   Procedure: COLONOSCOPY;  Surgeon: Rogene Houston, MD;  [facility];  Service: Endoscopy;  Laterality: N/A;  12:00   CYST EXCISION     Left breast   ESOPHAGOGASTRODUODENOSCOPY (EGD) WITH PROPOFOL N/A 06/16/2021   Procedure: ESOPHAGOGASTRODUODENOSCOPY (EGD) WITH PROPOFOL;  Surgeon: Harvel Quale, MD;  [facility];  Service: Gastroenterology;  Laterality: N/A;  12:50   EXCISIONAL HEMORRHOIDECTOMY     EYE SURGERY  11/22/1965   EYE SURGERY  bilateral cataract removal with lens implants   EYE SURGERY     growth removed from right eye lid   EYE SURGERY     bilateral eye surgery age 43 t"to straighten eyes'   heel (other)  11/23/2003   HEEL SPUR SURGERY     Right heel - growth removal and rebuilding of heel   HEMORRHOID SURGERY     HERNIA REPAIR     KNEE ARTHROSCOPY Bilateral    LUMBAR LAMINECTOMY/DECOMPRESSION MICRODISCECTOMY Right 08/23/2014   Procedure: LUMBAR LAMINECTOMY/DECOMPRESSION MICRODISCECTOMY 1 LEVEL;  Surgeon: Charlie Pitter, MD;  Location: [facility];  Service: Neurosurgery;  Laterality: Right;  LUMBAR LAMINECTOMY/DECOMPRESSION MICRODISCECTOMY 1 LEVEL LUMBAR 2-3   ovaries removed  11/22/2001   PARTIAL HYSTERECTOMY  11/23/1979   SPHINCTEROTOMY     spinahatomy  11/22/1990   TOTAL KNEE ARTHROPLASTY Right 11/05/2020   Procedure: RIGHT TOTAL KNEE ARTHROPLASTY;  Surgeon: Marybelle Killings, MD;  Location: [facility];  Service: Orthopedics;  Laterality: Right;   TUBAL LIGATION  11/22/1973   TUBAL LIGATION      Current Outpatient Medications  Medication Sig Dispense Refill   albuterol (VENTOLIN HFA) 108 (90 Base) MCG/ACT inhaler Inhale 2 puffs into the  lungs 4 (four) times daily as needed for shortness of breath.     atorvastatin (LIPITOR) 10 MG tablet Take 10 mg by mouth daily.     BREO ELLIPTA 200-25 MCG/INH AEPB Inhale 2 puffs into the lungs daily.     Calcium Carb-Cholecalciferol (CALCIUM 500+D PO) Take 1 tablet by mouth in the morning and at bedtime.     Cholecalciferol (VITAMIN D) 50 MCG (2000 UT) tablet Take 2,000 Units by mouth daily.     Cyanocobalamin (VITAMIN B-12) 5000 MCG SUBL Place 5,000 mcg under the tongue every other day.     diazepam (VALIUM) 5 MG tablet Take 5 mg by mouth every 8 (eight) hours as needed.     famotidine (PEPCID) 40 MG tablet Take 40 mg by mouth every morning.     ferrous sulfate 325 (65 FE) MG EC tablet Take 325 mg by mouth daily with breakfast.     furosemide (LASIX) 20 MG tablet Take 20 mg by mouth 2 (two) times daily.     levothyroxine (SYNTHROID) 112 MCG tablet Take 112 mcg by mouth daily before breakfast.     Multiple Vitamin (MULTIVITAMIN PO) Take 2 tablets by mouth daily. ADEK vitamin chewable     multivitamin-lutein (OCUVITE-LUTEIN) CAPS capsule Take 1 capsule by mouth daily.     olopatadine (PATANOL) 0.1 % ophthalmic solution Place 1 drop into both eyes daily.     pantoprazole (PROTONIX) 40 MG tablet Take 40 mg by mouth daily.     predniSONE (DELTASONE) 5 MG tablet Take 5 mg by mouth 2 (two) times daily.     spironolactone (ALDACTONE) 25 MG tablet Take 25 mg by mouth daily.     Zinc 15 MG CAPS Take 15 mg by mouth daily.     hyoscyamine (LEVSIN) 0.125 MG tablet Take 1 tablet (0.125 mg total) by mouth every 8 (eight) hours as needed. (Patient not taking: Reported on 08/05/2021) 90 tablet 5   No current facility-administered medications for this visit.    Allergies as of 01/05/2022 - Review Complete 01/05/2022  Allergen Reaction Noted   Cinnamon Anaphylaxis 09/29/2016   Hydrocodone Other (See Comments) 09/06/2014   Naproxen Anaphylaxis and Other (See Comments)    Other Shortness Of Breath and  Other (See Comments) 08/20/2011  Other Shortness Of Breath and Other (See Comments) 04/06/2017   Oxycodone Other (See Comments) 09/06/2014   Feldene [piroxicam] Nausea Only 09/28/2016   Lyrica [pregabalin] Other (See Comments) 09/28/2016   Nsaids  10/22/2020   Phenergan [promethazine hcl] Other (See Comments) 08/04/2016   Sulfa antibiotics Other (See Comments) 04/06/2017   Latex Rash and Other (See Comments) 04/06/2017   Tape Rash and Other (See Comments) 08/14/2014    Family History  Problem Relation Age of Onset   Cancer Other    Heart failure Other    Colon cancer Brother     Social History   Socioeconomic History   Marital status: Married    Spouse name: Not on file   Number of children: Not on file   Years of education: Not on file   Highest education level: Not on file  Occupational History   Not on file  Tobacco Use   Smoking status: Never   Smokeless tobacco: Never  Vaping Use   Vaping Use: Never used  Substance and Sexual Activity   Alcohol use: No   Drug use: No   Sexual activity: Not on file  Other Topics Concern   Not on file  Social History Narrative   ** Merged History Encounter **       Regularly exercises. Full Time.    Social Determinants of Health   Financial Resource Strain: Not on file  Food Insecurity: Not on file  Transportation Needs: Not on file  Physical Activity: Not on file  Stress: Not on file  Social Connections: Not on file   Review of systems General: negative for malaise, night sweats, fever, chills, weight loss Neck: Negative for lumps, goiter, pain and significant neck swelling Resp: Negative for cough, wheezing, dyspnea at rest CV: Negative for chest pain, palpitations, orthopnea +leg swelling GI: denies melena, hematochezia, nausea, vomiting, constipation, dysphagia, odyonophagia, early satiety or unintentional weight loss. +diarrhea +acid reflux +lower abd pain MSK: Negative for joint pain or swelling, back pain, and  muscle pain. Derm: Negative for itching or rash Psych: Denies depression, anxiety, memory loss. No homicidal or suicidal ideation. +confusion Heme: Negative for prolonged bleeding, bruising easily, and swollen nodes. Endocrine: Negative for cold or heat intolerance, polyuria, polydipsia and goiter. Neuro: negative for tremor, gait imbalance, syncope and seizures. The remainder of the review of systems is noncontributory.  Physical Exam: BP 130/73 (BP Location: Left Arm, Patient Position: Sitting, Cuff Size: Large)    Pulse 69    Temp 97.7 F (36.5 C) (Oral)    Ht 5' (1.524 m)    Wt 141 lb 1.6 oz (64 kg)    BMI 27.56 kg/m  General:   Alert and oriented. No distress noted. Pleasant and cooperative.  Head:  Normocephalic and atraumatic. Eyes:  Conjuctiva clear without scleral icterus. Mouth:  Oral mucosa pink and moist. Good dentition. No lesions. Heart: Normal rate and rhythm, s1 and s2 heart sounds present.  Lungs: Clear lung sounds in all lobes. Respirations equal and unlabored. Abdomen:  +BS, soft, non-tender, full but soft. No rebound or guarding. No HSM or masses noted. Derm: No palmar erythema or jaundice Msk:  Symmetrical without gross deformities. Normal posture. Extremities:  Without edema. Neurologic:  Alert and  oriented x4 Psych:  Alert and cooperative. Normal mood and affect.  Invalid input(s): 6 MONTHS   ASSESSMENT: Kimberly [name] is a 65 y.o. female presenting today for abdominal pain and follow up of cirrhosis/GERD.  Lower abdominal pain and loose stools  ongoing since duodenal switch, patient found no relief from levsin or bentyl, continues to have fecal urgency especially after meals. She is without alarm symptoms. SIBO breath test previously ordered was not able to be completed. She has yet to follow up with bariatric surgeon as recommended at last visit. Symptoms could be a combination of previous surgery as well as IBS-D, as they worsened after duodenal switch. We  will try course of xifaxan for her diarrhea to see if there is any improvement in her symptoms as previous interventions have proven to provide little result.   Cirrhosis appears to have remained well compensated. Last MELD 6. She is without hx of varices, encephalopathy,. She reports some L sided abdominal swelling and some heaviness and possible swelling to LEs, though exam is relatively unremarkable today. She is maintained on lasix 33m BID and spironolactone 242mdaily which she feels has helped keep edema at bay. She reports that she feels confused at times. She is a/ox4 in office today, answering questions appropriately. If she continues to present with episodes of confusion, will need to consider lactulose, however, at this time she is having ongoing diarrhea, which will be worsened with this medication.   As far as her GERD, she continues to notice reflux symptoms almost nightly, is taking protonix 4025mnce daily in the morning as well as pepcid 58m66m the morning,  using tums in the evening. I discussed trying her pepcid in the evenings to see if this provides any result. She should continue with reflux precautions, staying upright 2-3 hours after eating and avoiding trigger foods. Will consider change in PPI therapy if symptoms continue further.   PLAN:  -MELD labs, AFP - RUQ US -[place]oreaeduce salt intake to <2 g per day - Can take Tylenol max of 2 g per day (650 mg q8h) for pain - Avoid NSAIDs for pain - Avoid eating raw oysters/shellfish - Ensure every night before going to sleep -xifaxan 550mg63m x2 weeks for IBS related diarrhea -continue lasix and spironolactone at current dose -continue protonix 58mg 67my, take famotidine 58mg i6me evenings  -reflux precautions  Updated MELD: 6 (01/05/22)  Follow Up: 6 months  Kyren Knick L. Abdulah Iqbal,Alver SorrowAPRN, AGNP-C Adult-Gerontology Nurse Practitioner [facility]

## 2022-01-06 LAB — COMPREHENSIVE METABOLIC PANEL
AG Ratio: 1.7 (calc) (ref 1.0–2.5)
ALT: 31 U/L — ABNORMAL HIGH (ref 6–29)
AST: 36 U/L — ABNORMAL HIGH (ref 10–35)
Albumin: 4 g/dL (ref 3.6–5.1)
Alkaline phosphatase (APISO): 78 U/L (ref 37–153)
BUN: 19 mg/dL (ref 7–25)
CO2: 24 mmol/L (ref 20–32)
Calcium: 9.2 mg/dL (ref 8.6–10.4)
Chloride: 111 mmol/L — ABNORMAL HIGH (ref 98–110)
Creat: 1 mg/dL (ref 0.50–1.05)
Globulin: 2.4 g/dL (calc) (ref 1.9–3.7)
Glucose, Bld: 78 mg/dL (ref 65–99)
Potassium: 3.6 mmol/L (ref 3.5–5.3)
Sodium: 143 mmol/L (ref 135–146)
Total Bilirubin: 2 mg/dL — ABNORMAL HIGH (ref 0.2–1.2)
Total Protein: 6.4 g/dL (ref 6.1–8.1)

## 2022-01-06 LAB — CBC
HCT: 36.4 % (ref 35.0–45.0)
Hemoglobin: 12.3 g/dL (ref 11.7–15.5)
MCH: 34.4 pg — ABNORMAL HIGH (ref 27.0–33.0)
MCHC: 33.8 g/dL (ref 32.0–36.0)
MCV: 101.7 fL — ABNORMAL HIGH (ref 80.0–100.0)
MPV: 11 fL (ref 7.5–12.5)
Platelets: 134 10*3/uL — ABNORMAL LOW (ref 140–400)
RBC: 3.58 10*6/uL — ABNORMAL LOW (ref 3.80–5.10)
RDW: 12.4 % (ref 11.0–15.0)
WBC: 6.5 10*3/uL (ref 3.8–10.8)

## 2022-01-06 LAB — AFP TUMOR MARKER: AFP-Tumor Marker: 2.7 ng/mL

## 2022-01-06 LAB — PROTIME-INR
INR: 1
Prothrombin Time: 10.4 s (ref 9.0–11.5)

## 2022-01-07 ENCOUNTER — Other Ambulatory Visit: Payer: Self-pay

## 2022-01-07 ENCOUNTER — Ambulatory Visit (HOSPITAL_COMMUNITY)
Admission: RE | Admit: 2022-01-07 | Discharge: 2022-01-07 | Disposition: A | Discharge status: Home / Self Care | Payer: Medicare Other | Source: Ambulatory Visit | Attending: Gastroenterology | Admitting: Gastroenterology

## 2022-01-07 DIAGNOSIS — K7581 Nonalcoholic steatohepatitis (NASH): Secondary | ICD-10-CM | POA: Insufficient documentation

## 2022-01-07 DIAGNOSIS — K7460 Unspecified cirrhosis of liver: Secondary | ICD-10-CM | POA: Insufficient documentation

## 2022-01-07 DIAGNOSIS — Z9049 Acquired absence of other specified parts of digestive tract: Secondary | ICD-10-CM | POA: Diagnosis not present

## 2022-01-12 DIAGNOSIS — E113293 Type 2 diabetes mellitus with mild nonproliferative diabetic retinopathy without macular edema, bilateral: Secondary | ICD-10-CM | POA: Diagnosis not present

## 2022-01-12 DIAGNOSIS — H353231 Exudative age-related macular degeneration, bilateral, with active choroidal neovascularization: Secondary | ICD-10-CM | POA: Diagnosis not present

## 2022-01-12 DIAGNOSIS — H35371 Puckering of macula, right eye: Secondary | ICD-10-CM | POA: Diagnosis not present

## 2022-01-12 DIAGNOSIS — H43823 Vitreomacular adhesion, bilateral: Secondary | ICD-10-CM | POA: Diagnosis not present

## 2022-01-12 DIAGNOSIS — H35033 Hypertensive retinopathy, bilateral: Secondary | ICD-10-CM | POA: Diagnosis not present

## 2022-01-21 DIAGNOSIS — I10 Essential (primary) hypertension: Secondary | ICD-10-CM | POA: Diagnosis not present

## 2022-01-21 DIAGNOSIS — Z299 Encounter for prophylactic measures, unspecified: Secondary | ICD-10-CM | POA: Diagnosis not present

## 2022-01-21 DIAGNOSIS — J449 Chronic obstructive pulmonary disease, unspecified: Secondary | ICD-10-CM | POA: Diagnosis not present

## 2022-01-21 DIAGNOSIS — M25511 Pain in right shoulder: Secondary | ICD-10-CM | POA: Diagnosis not present

## 2022-02-01 ENCOUNTER — Telehealth: Payer: Self-pay | Admitting: *Deleted

## 2022-02-01 NOTE — Telephone Encounter (Signed)
Ortho bundle 1 year call completed. ?

## 2022-02-18 ENCOUNTER — Encounter: Payer: Self-pay | Admitting: Orthopaedic Surgery

## 2022-02-18 ENCOUNTER — Telehealth: Payer: Self-pay | Admitting: Orthopaedic Surgery

## 2022-02-18 ENCOUNTER — Ambulatory Visit (INDEPENDENT_AMBULATORY_CARE_PROVIDER_SITE_OTHER): Payer: Medicare Other | Admitting: Orthopaedic Surgery

## 2022-02-18 VITALS — Ht 60.0 in | Wt 135.0 lb

## 2022-02-18 DIAGNOSIS — M1712 Unilateral primary osteoarthritis, left knee: Secondary | ICD-10-CM

## 2022-02-18 DIAGNOSIS — Z96651 Presence of right artificial knee joint: Secondary | ICD-10-CM

## 2022-02-18 DIAGNOSIS — M6281 Muscle weakness (generalized): Secondary | ICD-10-CM

## 2022-02-18 NOTE — Telephone Encounter (Signed)
Patient called. Says [facility] Physical therapy told her that the referral or RX needs to be faxed to them. [phone] ?

## 2022-02-18 NOTE — Progress Notes (Signed)
? ?Office Visit Note ?  ?Patient: Kimberly [name]           ?Date of Birth: 1956-06-22           ?MRN: [id number] ?Visit Date: 02/18/2022 ?             ?Requested by: Glenda Chroman, MD ?[address] ?[place],  [place] [address] ?PCP: Glenda Chroman, MD ? ? ?Assessment & Plan: ?Visit Diagnoses:  ?1. Quadriceps weakness   ?2. S/P total knee arthroplasty, right   ?3. Unilateral primary osteoarthritis, left knee   ? ? ?Plan: Patient needs further therapy for quad strengthening at [facility].  She needs to work on quad strengthening both right and left and increase her home exercise program to strengthen her quads.  I will recheck her in 3 months. ? ?Follow-Up Instructions: No follow-ups on file.  ? ?Orders:  ?No orders of the defined types were placed in this encounter. ? ?No orders of the defined types were placed in this encounter. ? ? ? ? Procedures: ?No procedures performed ? ? ?Clinical Data: ?No additional findings. ? ? ?Subjective: ?Chief Complaint  ?Patient presents with  ? Right Knee - Follow-up, Pain  ? Left Knee - Follow-up, Pain  ? ? ?HPI 66 year old female returns states she is moved down within a better neighborhood try to get back to walking activities.  Total knee arthroplasty on the right done 11/05/2020.  She continues to have some pain in her legs recent bone density showed osteoporosis.  Left knee has osteoarthritis but after doing moving activities she has had some increased discomfort in her right knee. ? ?Review of Systems all systems updated unchanged. ? ? ?Objective: ?Vital Signs: Ht 5' (1.524 m)   Wt 135 lb (61.2 kg)   BMI 26.37 kg/m?  ? ?Physical Exam ?Constitutional:   ?   Appearance: She is well-developed.  ?HENT:  ?   Head: Normocephalic.  ?   Right Ear: External ear normal.  ?   Left Ear: External ear normal. There is no impacted cerumen.  ?Eyes:  ?   Pupils: Pupils are equal, round, and reactive to light.  ?Neck:  ?   Thyroid: No thyromegaly.  ?   Trachea: No tracheal  deviation.  ?Cardiovascular:  ?   Rate and Rhythm: Normal rate.  ?Pulmonary:  ?   Effort: Pulmonary effort is normal.  ?Abdominal:  ?   Palpations: Abdomen is soft.  ?Musculoskeletal:  ?   Cervical back: No rigidity.  ?Skin: ?   General: Skin is warm and dry.  ?Neurological:  ?   Mental Status: She is alert and oriented to person, place, and time.  ?Psychiatric:     ?   Behavior: Behavior normal.  ? ? ?Ortho Exam bilateral quad weakness with resisted testing she has full extension good flexion uses her hands like she is going up for rebound in order to get up on a step with both right and left leg with a hop step.  Negative logroll the hips. ? ?Specialty Comments:  ?No specialty comments available. ? ?Imaging: ?No results found. ? ? ?PMFS History: ?Patient Active Problem List  ? Diagnosis Date Noted  ? Unilateral primary osteoarthritis, left knee 02/18/2022  ? IBS (irritable bowel syndrome) 06/08/2021  ? Chronic diarrhea 06/08/2021  ? Irritable bowel syndrome with diarrhea 06/08/2021  ? Quadriceps weakness 03/19/2021  ? S/P total knee arthroplasty, right 12/18/2020  ? Morbid obesity ([place]) 02/08/2018  ? Pancytopenia ([place])  04/09/2017  ? Other cirrhosis of liver ([place]) 04/09/2017  ? DM type 2 causing vascular disease ([place]) 04/06/2017  ? HTN (hypertension) 04/06/2017  ? OSA (obstructive sleep apnea) 04/06/2017  ? Asthma in adult 04/06/2017  ? Obesity (BMI 30-39.9) 04/06/2017  ? Primary osteoarthritis of both hands 10/26/2016  ? Primary osteoarthritis of both feet 10/26/2016  ? COPD (chronic obstructive pulmonary disease) ([place]) 09/27/2016  ? Hypothyroidism 09/27/2016  ? Vertebral compression fracture ([place]) 09/27/2016  ? Fibromyalgia 09/27/2016  ? Restless leg syndrome 09/27/2016  ? Lumbar radiculitis 10/01/2014  ? Nerve pain 08/30/2014  ? Lumbar stenosis with neurogenic claudication 08/28/2014  ? Spondylosis of lumbar region without myelopathy or radiculopathy 08/23/2014  ? Lumbosacral stenosis with neurogenic  claudication ([place]) 08/23/2014  ? Vaginal discharge 06/22/2014  ? Chest pain 06/21/2014  ? Spinal stenosis 08/23/2011  ? Obstructive sleep apnea 08/23/2011  ? Diabetes ([place]) 08/12/2010  ? CAD 08/12/2010  ? Chronic diastolic heart failure ([place]) 08/12/2010  ? Mixed hyperlipidemia 06/23/2009  ? OVERWEIGHT/OBESITY 06/23/2009  ? Essential hypertension, benign 06/23/2009  ? CHEST PAIN-UNSPECIFIED 06/23/2009  ? ?Past Medical History:  ?Diagnosis Date  ? Anemia   ? PMH: as a child  ? Arthritis   ? Asthma   ? CAD (coronary artery disease)   ? nonobstructive by cath, 6/08 (false positive Cardiolite) normal stress echo, 7/11  ? CHF (congestive heart failure) ([place])   ? Chronic back pain   ? Chronic bronchitis ([place])   ? Cirrhosis ([place])   ? Complication of anesthesia   ? had an asthma attack when woke up from procedure  ? Complication of anesthesia   ? asthma attack with one surgery  ? COPD (chronic obstructive pulmonary disease) ([place])   ? Degenerative joint disease   ? Depression   ? Diabetes mellitus without complication ([place])   ? DJD (degenerative joint disease)   ? Fatty liver   ? Fibromyalgia   ? GERD (gastroesophageal reflux disease)   ? Glaucoma   ? Headache(784.0)   ? Heart failure, diastolic, chronic ([place])   ? Heart murmur   ? PMH:As a child only  ? Hernia of abdominal cavity   ? "upper and lower hernia"  ? History of hiatal hernia   ? HTN (hypertension)   ? Hypertension   ? Hypothyroidism   ? IBS (irritable bowel syndrome)   ? IDDM (insulin dependent diabetes mellitus)   ? Morbid obesity ([place])   ? Neuropathy   ? associated with diabetes  ? Obstructive sleep apnea   ? Pancreatitis   ? Pinched nerve in neck   ? Pneumonia   ? as a child  ? Pulmonary hypertension ([place])   ? Rheumatic fever   ? PMH: as a child  ? Seizures ([place])   ? PMH: only as a child  ? Seizures ([place])   ? in childhood  ? Shortness of breath   ? Sleep apnea   ? Spinal stenosis   ? Stroke [facility])   ? 12/09/2013  ?  ?Family History  ?Problem Relation Age of Onset   ? Cancer Other   ? Heart failure Other   ? Colon cancer Brother   ?  ?Past Surgical History:  ?Procedure Laterality Date  ? ABDOMINAL HYSTERECTOMY    ? ABDOMINAL HYSTERECTOMY    ? partial hysterectomy  ? APPENDECTOMY    ? BACK SURGERY    ? BACK SURGERY    ? disc surgery with complications and damage to right side  ?  BILATERAL KNEE ARTHROSCOPY  2000, 2009  ? BIOPSY  06/16/2021  ? Procedure: BIOPSY;  Surgeon: Montez Morita, Quillian Quince, MD;  [facility];  Service: Gastroenterology;;  ? BREAST CYST EXCISION Left   ? CARDIAC CATHETERIZATION    ? 2009  ? CATARACT EXTRACTION W/ INTRAOCULAR LENS  IMPLANT, BILATERAL    ? CHOLECYSTECTOMY  11/22/1992  ? CHOLECYSTECTOMY    ? COLONOSCOPY N/A 08/01/2013  ? Procedure: COLONOSCOPY;  Surgeon: Rogene Houston, MD;  [facility];  Service: Endoscopy;  Laterality: N/A;  930  ? COLONOSCOPY N/A 08/03/2017  ? Procedure: COLONOSCOPY;  Surgeon: Rogene Houston, MD;  [facility];  Service: Endoscopy;  Laterality: N/A;  12:00  ? CYST EXCISION    ? Left breast  ? ESOPHAGOGASTRODUODENOSCOPY (EGD) WITH PROPOFOL N/A 06/16/2021  ? Procedure: ESOPHAGOGASTRODUODENOSCOPY (EGD) WITH PROPOFOL;  Surgeon: Harvel Quale, MD;  [facility];  Service: Gastroenterology;  Laterality: N/A;  12:50  ? EXCISIONAL HEMORRHOIDECTOMY    ? EYE SURGERY  11/22/1965  ? EYE SURGERY    ? bilateral cataract removal with lens implants  ? EYE SURGERY    ? growth removed from right eye lid  ? EYE SURGERY    ? bilateral eye surgery age 43 t"to straighten eyes'  ? heel (other)  11/23/2003  ? HEEL SPUR SURGERY    ? Right heel - growth removal and rebuilding of heel  ? HEMORRHOID SURGERY    ? HERNIA REPAIR    ? KNEE ARTHROSCOPY Bilateral   ? LUMBAR LAMINECTOMY/DECOMPRESSION MICRODISCECTOMY Right 08/23/2014  ? Procedure: LUMBAR LAMINECTOMY/DECOMPRESSION MICRODISCECTOMY 1 LEVEL;  Surgeon: Charlie Pitter, MD;  Location: [place];  Service: Neurosurgery;  Laterality: Right;   LUMBAR LAMINECTOMY/DECOMPRESSION MICRODISCECTOMY 1 LEVEL LUMBAR 2-3  ? ovaries removed  11/22/2001  ? PARTIAL HYSTERECTOMY  11/23/1979  ? SPHINCTEROTOMY    ? spinahatomy  11/22/1990  ? TOTAL KNEE ARTHROPLASTY Right 12

## 2022-02-18 NOTE — Telephone Encounter (Signed)
Referral faxed

## 2022-02-18 NOTE — Addendum Note (Signed)
Addended by: Meyer Cory on: 02/18/2022 10:35 AM ? ? Modules accepted: Orders ? ?

## 2022-02-21 DIAGNOSIS — J449 Chronic obstructive pulmonary disease, unspecified: Secondary | ICD-10-CM | POA: Diagnosis not present

## 2022-02-23 DIAGNOSIS — H353231 Exudative age-related macular degeneration, bilateral, with active choroidal neovascularization: Secondary | ICD-10-CM | POA: Diagnosis not present

## 2022-02-23 DIAGNOSIS — H35033 Hypertensive retinopathy, bilateral: Secondary | ICD-10-CM | POA: Diagnosis not present

## 2022-02-23 DIAGNOSIS — H35371 Puckering of macula, right eye: Secondary | ICD-10-CM | POA: Diagnosis not present

## 2022-02-23 DIAGNOSIS — E113293 Type 2 diabetes mellitus with mild nonproliferative diabetic retinopathy without macular edema, bilateral: Secondary | ICD-10-CM | POA: Diagnosis not present

## 2022-02-23 DIAGNOSIS — H43823 Vitreomacular adhesion, bilateral: Secondary | ICD-10-CM | POA: Diagnosis not present

## 2022-03-01 DIAGNOSIS — Z96651 Presence of right artificial knee joint: Secondary | ICD-10-CM | POA: Diagnosis not present

## 2022-03-01 DIAGNOSIS — M25561 Pain in right knee: Secondary | ICD-10-CM | POA: Diagnosis not present

## 2022-03-01 DIAGNOSIS — M25662 Stiffness of left knee, not elsewhere classified: Secondary | ICD-10-CM | POA: Diagnosis not present

## 2022-03-01 DIAGNOSIS — M25562 Pain in left knee: Secondary | ICD-10-CM | POA: Diagnosis not present

## 2022-03-01 DIAGNOSIS — M1712 Unilateral primary osteoarthritis, left knee: Secondary | ICD-10-CM | POA: Diagnosis not present

## 2022-03-01 DIAGNOSIS — G8929 Other chronic pain: Secondary | ICD-10-CM | POA: Diagnosis not present

## 2022-03-01 DIAGNOSIS — M25661 Stiffness of right knee, not elsewhere classified: Secondary | ICD-10-CM | POA: Diagnosis not present

## 2022-03-01 DIAGNOSIS — M6281 Muscle weakness (generalized): Secondary | ICD-10-CM | POA: Diagnosis not present

## 2022-03-01 DIAGNOSIS — R29898 Other symptoms and signs involving the musculoskeletal system: Secondary | ICD-10-CM | POA: Diagnosis not present

## 2022-03-05 DIAGNOSIS — M25562 Pain in left knee: Secondary | ICD-10-CM | POA: Diagnosis not present

## 2022-03-05 DIAGNOSIS — M25561 Pain in right knee: Secondary | ICD-10-CM | POA: Diagnosis not present

## 2022-03-05 DIAGNOSIS — M6281 Muscle weakness (generalized): Secondary | ICD-10-CM | POA: Diagnosis not present

## 2022-03-05 DIAGNOSIS — M25661 Stiffness of right knee, not elsewhere classified: Secondary | ICD-10-CM | POA: Diagnosis not present

## 2022-03-05 DIAGNOSIS — G8929 Other chronic pain: Secondary | ICD-10-CM | POA: Diagnosis not present

## 2022-03-05 DIAGNOSIS — M1712 Unilateral primary osteoarthritis, left knee: Secondary | ICD-10-CM | POA: Diagnosis not present

## 2022-03-05 DIAGNOSIS — M25662 Stiffness of left knee, not elsewhere classified: Secondary | ICD-10-CM | POA: Diagnosis not present

## 2022-03-05 DIAGNOSIS — Z96651 Presence of right artificial knee joint: Secondary | ICD-10-CM | POA: Diagnosis not present

## 2022-03-05 DIAGNOSIS — R29898 Other symptoms and signs involving the musculoskeletal system: Secondary | ICD-10-CM | POA: Diagnosis not present

## 2022-03-08 DIAGNOSIS — M1712 Unilateral primary osteoarthritis, left knee: Secondary | ICD-10-CM | POA: Diagnosis not present

## 2022-03-08 DIAGNOSIS — M6281 Muscle weakness (generalized): Secondary | ICD-10-CM | POA: Diagnosis not present

## 2022-03-08 DIAGNOSIS — M25561 Pain in right knee: Secondary | ICD-10-CM | POA: Diagnosis not present

## 2022-03-08 DIAGNOSIS — M25661 Stiffness of right knee, not elsewhere classified: Secondary | ICD-10-CM | POA: Diagnosis not present

## 2022-03-08 DIAGNOSIS — R29898 Other symptoms and signs involving the musculoskeletal system: Secondary | ICD-10-CM | POA: Diagnosis not present

## 2022-03-08 DIAGNOSIS — G8929 Other chronic pain: Secondary | ICD-10-CM | POA: Diagnosis not present

## 2022-03-08 DIAGNOSIS — Z96651 Presence of right artificial knee joint: Secondary | ICD-10-CM | POA: Diagnosis not present

## 2022-03-08 DIAGNOSIS — M25662 Stiffness of left knee, not elsewhere classified: Secondary | ICD-10-CM | POA: Diagnosis not present

## 2022-03-08 DIAGNOSIS — M25562 Pain in left knee: Secondary | ICD-10-CM | POA: Diagnosis not present

## 2022-03-12 DIAGNOSIS — M25662 Stiffness of left knee, not elsewhere classified: Secondary | ICD-10-CM | POA: Diagnosis not present

## 2022-03-12 DIAGNOSIS — M25562 Pain in left knee: Secondary | ICD-10-CM | POA: Diagnosis not present

## 2022-03-12 DIAGNOSIS — M25661 Stiffness of right knee, not elsewhere classified: Secondary | ICD-10-CM | POA: Diagnosis not present

## 2022-03-12 DIAGNOSIS — M25561 Pain in right knee: Secondary | ICD-10-CM | POA: Diagnosis not present

## 2022-03-12 DIAGNOSIS — M1712 Unilateral primary osteoarthritis, left knee: Secondary | ICD-10-CM | POA: Diagnosis not present

## 2022-03-12 DIAGNOSIS — M6281 Muscle weakness (generalized): Secondary | ICD-10-CM | POA: Diagnosis not present

## 2022-03-12 DIAGNOSIS — Z96651 Presence of right artificial knee joint: Secondary | ICD-10-CM | POA: Diagnosis not present

## 2022-03-12 DIAGNOSIS — G8929 Other chronic pain: Secondary | ICD-10-CM | POA: Diagnosis not present

## 2022-03-12 DIAGNOSIS — R29898 Other symptoms and signs involving the musculoskeletal system: Secondary | ICD-10-CM | POA: Diagnosis not present

## 2022-03-23 DIAGNOSIS — J449 Chronic obstructive pulmonary disease, unspecified: Secondary | ICD-10-CM | POA: Diagnosis not present

## 2022-03-29 DIAGNOSIS — Z7189 Other specified counseling: Secondary | ICD-10-CM | POA: Diagnosis not present

## 2022-03-29 DIAGNOSIS — Z299 Encounter for prophylactic measures, unspecified: Secondary | ICD-10-CM | POA: Diagnosis not present

## 2022-03-29 DIAGNOSIS — Z0000 Encounter for general adult medical examination without abnormal findings: Secondary | ICD-10-CM | POA: Diagnosis not present

## 2022-03-29 DIAGNOSIS — E039 Hypothyroidism, unspecified: Secondary | ICD-10-CM | POA: Diagnosis not present

## 2022-03-29 DIAGNOSIS — I10 Essential (primary) hypertension: Secondary | ICD-10-CM | POA: Diagnosis not present

## 2022-03-29 DIAGNOSIS — Z789 Other specified health status: Secondary | ICD-10-CM | POA: Diagnosis not present

## 2022-04-05 DIAGNOSIS — M810 Age-related osteoporosis without current pathological fracture: Secondary | ICD-10-CM | POA: Diagnosis not present

## 2022-04-07 DIAGNOSIS — R350 Frequency of micturition: Secondary | ICD-10-CM | POA: Diagnosis not present

## 2022-04-07 DIAGNOSIS — K5792 Diverticulitis of intestine, part unspecified, without perforation or abscess without bleeding: Secondary | ICD-10-CM | POA: Diagnosis not present

## 2022-04-07 DIAGNOSIS — K7460 Unspecified cirrhosis of liver: Secondary | ICD-10-CM | POA: Diagnosis not present

## 2022-04-07 DIAGNOSIS — I10 Essential (primary) hypertension: Secondary | ICD-10-CM | POA: Diagnosis not present

## 2022-04-07 DIAGNOSIS — Z299 Encounter for prophylactic measures, unspecified: Secondary | ICD-10-CM | POA: Diagnosis not present

## 2022-04-20 DIAGNOSIS — Z961 Presence of intraocular lens: Secondary | ICD-10-CM | POA: Diagnosis not present

## 2022-04-20 DIAGNOSIS — E119 Type 2 diabetes mellitus without complications: Secondary | ICD-10-CM | POA: Diagnosis not present

## 2022-04-20 DIAGNOSIS — Z7984 Long term (current) use of oral hypoglycemic drugs: Secondary | ICD-10-CM | POA: Diagnosis not present

## 2022-04-20 DIAGNOSIS — H353232 Exudative age-related macular degeneration, bilateral, with inactive choroidal neovascularization: Secondary | ICD-10-CM | POA: Diagnosis not present

## 2022-04-20 DIAGNOSIS — H16223 Keratoconjunctivitis sicca, not specified as Sjogren's, bilateral: Secondary | ICD-10-CM | POA: Diagnosis not present

## 2022-04-23 DIAGNOSIS — H353231 Exudative age-related macular degeneration, bilateral, with active choroidal neovascularization: Secondary | ICD-10-CM | POA: Diagnosis not present

## 2022-04-23 DIAGNOSIS — J449 Chronic obstructive pulmonary disease, unspecified: Secondary | ICD-10-CM | POA: Diagnosis not present

## 2022-05-06 DIAGNOSIS — Z0000 Encounter for general adult medical examination without abnormal findings: Secondary | ICD-10-CM | POA: Diagnosis not present

## 2022-05-06 DIAGNOSIS — I10 Essential (primary) hypertension: Secondary | ICD-10-CM | POA: Diagnosis not present

## 2022-05-06 DIAGNOSIS — E039 Hypothyroidism, unspecified: Secondary | ICD-10-CM | POA: Diagnosis not present

## 2022-05-06 DIAGNOSIS — M545 Low back pain, unspecified: Secondary | ICD-10-CM | POA: Diagnosis not present

## 2022-05-06 DIAGNOSIS — E7800 Pure hypercholesterolemia, unspecified: Secondary | ICD-10-CM | POA: Diagnosis not present

## 2022-05-06 DIAGNOSIS — Z299 Encounter for prophylactic measures, unspecified: Secondary | ICD-10-CM | POA: Diagnosis not present

## 2022-05-06 DIAGNOSIS — R5383 Other fatigue: Secondary | ICD-10-CM | POA: Diagnosis not present

## 2022-05-13 ENCOUNTER — Ambulatory Visit (INDEPENDENT_AMBULATORY_CARE_PROVIDER_SITE_OTHER): Payer: Medicare Other | Admitting: Gastroenterology

## 2022-05-13 ENCOUNTER — Encounter (INDEPENDENT_AMBULATORY_CARE_PROVIDER_SITE_OTHER): Payer: Self-pay | Admitting: Gastroenterology

## 2022-05-13 VITALS — BP 122/75 | HR 60 | Temp 98.0°F | Ht 60.0 in | Wt 138.9 lb

## 2022-05-13 DIAGNOSIS — K7460 Unspecified cirrhosis of liver: Secondary | ICD-10-CM

## 2022-05-13 DIAGNOSIS — R197 Diarrhea, unspecified: Secondary | ICD-10-CM

## 2022-05-13 DIAGNOSIS — R1030 Lower abdominal pain, unspecified: Secondary | ICD-10-CM

## 2022-05-13 DIAGNOSIS — R110 Nausea: Secondary | ICD-10-CM

## 2022-05-13 DIAGNOSIS — K7581 Nonalcoholic steatohepatitis (NASH): Secondary | ICD-10-CM

## 2022-05-13 MED ORDER — ONDANSETRON HCL 4 MG PO TABS: 4.0000 mg | ORAL_TABLET | Freq: Three times a day (TID) | ORAL | 1 refills | Status: DC | PRN

## 2022-05-13 NOTE — Patient Instructions (Signed)
We will get you scheduled for CT of your abdomen to rule out ongoing diverticulitis or other causes of abdominal pain We will plan for repeat colonoscopy in September Due for Ultrasound of liver and updated liver labs in August, we will mail you these closer to time and someone will reach out to set up [place]  We will push your next visit out to 6 months from now and cancel your august visit as I saw you in the interim  I will be in touch once CT results are back

## 2022-05-13 NOTE — Progress Notes (Unsigned)
Referring Provider: Glenda Chroman, MD Primary Care Physician:  Glenda Chroman, MD Primary GI Physician: Jenetta Downer   Chief Complaint  Patient presents with   Cirrhosis    Follow up on Cirrhosis. Still having mid abdominal pain and some on right side. Diarrhea, nausea, appetite not good. Has 5 -7 stools during the day and 2 -3 at night. Has had to start wearing depends because she does not realize at times when she has to go. States she finished treatment for diverticulitis. about one month ago.    HPI:   Kimberly [name] is a 66 y.o. female with past medical history of compensated NASH cirrhosis, CHF, coronary artery disease, depression, diabetes, COPD, IBS-D, hypothyroidism, hypertension, OSA, stroke, seizures, obesity status post duodenal switch   Patient presenting today for follow up of recent diverticulitis, one month ago.   History: Last seen 01/05/22, at that time having lower abdominal pain, diarrhea and fecal urgency, typically afer eating. Unable to pinpoint how many episodes of diarrhea she has per day. wearing a brief if she goes out because she has fecal urgency and sometimes cannot control it. She also reported issues with urinary incontinence as well.  lower abdominal pain is sometimes improved with lying In supine position. Did not notice levsin improving her symptoms. previously on bentyl without results. She was never able to have SIBO breath test done because of having COVID.  taking a lot of tums for indigestion and acid regurgitation, as well as protonix 58m daily.   She had cirrhosis labs done at that time as well with INR 1, AFP 2.7, AST 36, ALT 31, T bili 2, ALP 78, plt 134k. [place] done 01/07/22 with mild heterogeneity of liver without obvious mass.   Per chart review, appears that patient was treated with Cipro [id number] x 7 days, though no imaging available for review.  Present: States that she was treated for suspected diverticulitis last month by her PCP. She was  having severe lower abdominal cramping, diarrhea and lower back pain. She states she felt very tired and weak.  She reports that after the antibiotic, she felt some better but then diarrhea returned. She is having fecal urgency/incontinence. Usually she will have abdominal cramping and then will have a BM, sometimes cannot even control her bowels. Stools occur often after eating, though sometimes even when she doesn't eat she will have diarrhea.  She is unsure how many episodes of diarrhea she has per day but has had 3 so far today. When she saw PCP in mid may, she was having some blood in her stools. Abdominal pain is in mid lower abdomen. She reports she has a knot in this area. Sometimes she can massage the area with some relief. She does endorse some "roaring" noises in her abdomen. She denies any fevers but stays cold. She has a lot nausea but this is a chronic thing for her. She has an occasional formed stool, maybe 1/4 BMs. Reports diarrhea has been ongoing since duodenal switch in 2020.   Very occasional heartburn or acid reflux. Denies unintentional weight loss. She states appetite is not good, she gets hungry but then does not want anything when she tries to eat.   Last Colonoscopy:08/03/17- Diverticulosis at the hepatic flexure. - Internal hemorrhoids. - No specimens collected. Last Endoscopy:06/16/21- Small hiatal hernia. - Normal stomach. - Widely patent duodenal switch, characterized by healthy appearing mucosa was found. (Peptic duodenitis, no other alterations)  Past Medical History:  Diagnosis Date  Anemia    PMH: as a child   Arthritis    Asthma    CAD (coronary artery disease)    nonobstructive by cath, 6/08 (false positive Cardiolite) normal stress echo, 7/11   CHF (congestive heart failure) (HCC)    Chronic back pain    Chronic bronchitis ([place])    Cirrhosis ([place])    Complication of anesthesia    had an asthma attack when woke up from procedure   Complication of  anesthesia    asthma attack with one surgery   COPD (chronic obstructive pulmonary disease) ([place])    Degenerative joint disease    Depression    Diabetes mellitus without complication ([place])    DJD (degenerative joint disease)    Fatty liver    Fibromyalgia    GERD (gastroesophageal reflux disease)    Glaucoma    Headache(784.0)    Heart failure, diastolic, chronic (HCC)    Heart murmur    PMH:As a child only   Hernia of abdominal cavity    "upper and lower hernia"   History of hiatal hernia    HTN (hypertension)    Hypertension    Hypothyroidism    IBS (irritable bowel syndrome)    IDDM (insulin dependent diabetes mellitus)    Morbid obesity ([place])    Neuropathy    associated with diabetes   Obstructive sleep apnea    Pancreatitis    Pinched nerve in neck    Pneumonia    as a child   Pulmonary hypertension ([place])    Rheumatic fever    PMH: as a child   Seizures ([place])    PMH: only as a child   Seizures ([place])    in childhood   Shortness of breath    Sleep apnea    Spinal stenosis    Stroke ([place])    12/09/2013    Past Surgical History:  Procedure Laterality Date   ABDOMINAL HYSTERECTOMY     ABDOMINAL HYSTERECTOMY     partial hysterectomy   APPENDECTOMY     BACK SURGERY     BACK SURGERY     disc surgery with complications and damage to right side   BILATERAL KNEE ARTHROSCOPY  2000, 2009   BIOPSY  06/16/2021   Procedure: BIOPSY;  Surgeon: Harvel Quale, MD;  [facility];  Service: Gastroenterology;;   BREAST CYST EXCISION Left    CARDIAC CATHETERIZATION     2009   CATARACT EXTRACTION W/ INTRAOCULAR LENS  IMPLANT, BILATERAL     CHOLECYSTECTOMY  11/22/1992   CHOLECYSTECTOMY     COLONOSCOPY N/A 08/01/2013   Procedure: COLONOSCOPY;  Surgeon: Rogene Houston, MD;  [facility];  Service: Endoscopy;  Laterality: N/A;  930   COLONOSCOPY N/A 08/03/2017   Procedure: COLONOSCOPY;  Surgeon: Rogene Houston, MD;  [facility];  Service: Endoscopy;  Laterality: N/A;  12:00   CYST EXCISION     Left breast   ESOPHAGOGASTRODUODENOSCOPY (EGD) WITH PROPOFOL N/A 06/16/2021   Procedure: ESOPHAGOGASTRODUODENOSCOPY (EGD) WITH PROPOFOL;  Surgeon: Harvel Quale, MD;  [facility];  Service: Gastroenterology;  Laterality: N/A;  12:50   EXCISIONAL HEMORRHOIDECTOMY     EYE SURGERY  11/22/1965   EYE SURGERY     bilateral cataract removal with lens implants   EYE SURGERY     growth removed from right eye lid   EYE SURGERY     bilateral eye surgery age 53 t"to straighten eyes'  heel (other)  11/23/2003   HEEL SPUR SURGERY     Right heel - growth removal and rebuilding of heel   HEMORRHOID SURGERY     HERNIA REPAIR     KNEE ARTHROSCOPY Bilateral    LUMBAR LAMINECTOMY/DECOMPRESSION MICRODISCECTOMY Right 08/23/2014   Procedure: LUMBAR LAMINECTOMY/DECOMPRESSION MICRODISCECTOMY 1 LEVEL;  Surgeon: Charlie Pitter, MD;  Location: [facility];  Service: Neurosurgery;  Laterality: Right;  LUMBAR LAMINECTOMY/DECOMPRESSION MICRODISCECTOMY 1 LEVEL LUMBAR 2-3   ovaries removed  11/22/2001   PARTIAL HYSTERECTOMY  11/23/1979   SPHINCTEROTOMY     spinahatomy  11/22/1990   TOTAL KNEE ARTHROPLASTY Right 11/05/2020   Procedure: RIGHT TOTAL KNEE ARTHROPLASTY;  Surgeon: Marybelle Killings, MD;  Location: [facility];  Service: Orthopedics;  Laterality: Right;   TUBAL LIGATION  11/22/1973   TUBAL LIGATION      Current Outpatient Medications  Medication Sig Dispense Refill   albuterol (VENTOLIN HFA) 108 (90 Base) MCG/ACT inhaler Inhale 2 puffs into the lungs 4 (four) times daily as needed for shortness of breath.     BREO ELLIPTA 200-25 MCG/INH AEPB Inhale 2 puffs into the lungs daily.     Calcium Carb-Cholecalciferol (CALCIUM 500+D PO) Take 1 tablet by mouth in the morning and at bedtime.     Cholecalciferol (VITAMIN D) 50 MCG (2000 UT) tablet Take 2,000 Units by mouth daily.     Cyanocobalamin (VITAMIN B-12) 5000 MCG SUBL  Place 5,000 mcg under the tongue every other day.     cyclobenzaprine (FLEXERIL) 5 MG tablet Take 5 mg by mouth every 8 (eight) hours as needed.     famotidine (PEPCID) 40 MG tablet Take 40 mg by mouth every morning.     ferrous sulfate 325 (65 FE) MG EC tablet Take 325 mg by mouth daily with breakfast.     furosemide (LASIX) 20 MG tablet Take 20 mg by mouth 2 (two) times daily.     HYDROcodone-acetaminophen (NORCO/VICODIN) 5-325 MG tablet Take 0.5-1 tablets by mouth 3 (three) times daily as needed.     levothyroxine (SYNTHROID) 112 MCG tablet Take 112 mcg by mouth daily before breakfast.     Multiple Vitamin (MULTIVITAMIN PO) Take 2 tablets by mouth daily. ADEK vitamin chewable     multivitamin-lutein (OCUVITE-LUTEIN) CAPS capsule Take 1 capsule by mouth daily.     olopatadine (PATANOL) 0.1 % ophthalmic solution Place 1 drop into both eyes daily.     pantoprazole (PROTONIX) 40 MG tablet Take 40 mg by mouth daily.     spironolactone (ALDACTONE) 25 MG tablet Take 25 mg by mouth daily.     Zinc 15 MG CAPS Take 15 mg by mouth daily.     atorvastatin (LIPITOR) 10 MG tablet Take 10 mg by mouth daily. (Patient not taking: Reported on 05/13/2022)     diazepam (VALIUM) 5 MG tablet Take 5 mg by mouth every 8 (eight) hours as needed. (Patient not taking: Reported on 05/13/2022)     hyoscyamine (LEVSIN) 0.125 MG tablet Take 1 tablet (0.125 mg total) by mouth every 8 (eight) hours as needed. (Patient not taking: Reported on 08/05/2021) 90 tablet 5   predniSONE (DELTASONE) 5 MG tablet Take 5 mg by mouth 2 (two) times daily. (Patient not taking: Reported on 05/13/2022)     No current facility-administered medications for this visit.    Allergies as of 05/13/2022 - Review Complete 05/13/2022  Allergen Reaction Noted   Cinnamon Anaphylaxis 09/29/2016   Hydrocodone Other (See Comments) 09/06/2014   Naproxen  Anaphylaxis and Other (See Comments)    Other Shortness Of Breath and Other (See Comments) 08/20/2011    Other Shortness Of Breath and Other (See Comments) 04/06/2017   Oxycodone Other (See Comments) 09/06/2014   Feldene [piroxicam] Nausea Only 09/28/2016   Lyrica [pregabalin] Other (See Comments) 09/28/2016   Nsaids  10/22/2020   Phenergan [promethazine hcl] Other (See Comments) 08/04/2016   Sulfa antibiotics Other (See Comments) 04/06/2017   Latex Rash and Other (See Comments) 04/06/2017   Tape Rash and Other (See Comments) 08/14/2014    Family History  Problem Relation Age of Onset   Cancer Other    Heart failure Other    Colon cancer Brother     Social History   Socioeconomic History   Marital status: Married    Spouse name: Not on file   Number of children: Not on file   Years of education: Not on file   Highest education level: Not on file  Occupational History   Not on file  Tobacco Use   Smoking status: Never    Passive exposure: Never   Smokeless tobacco: Never  Vaping Use   Vaping Use: Never used  Substance and Sexual Activity   Alcohol use: No   Drug use: No   Sexual activity: Not on file  Other Topics Concern   Not on file  Social History Narrative   ** Merged History Encounter **       Regularly exercises. Full Time.    Social Determinants of Health   Financial Resource Strain: Not on file  Food Insecurity: Not on file  Transportation Needs: Not on file  Physical Activity: Not on file  Stress: Not on file  Social Connections: Not on file   Review of systems General: negative for malaise, night sweats, fever, chills, weight loss Neck: Negative for lumps, goiter, pain and significant neck swelling Resp: Negative for cough, wheezing, dyspnea at rest CV: Negative for chest pain, leg swelling, palpitations, orthopnea GI: denies melena, hematochezia, nausea, vomiting, diarrhea, constipation, dysphagia, odyonophagia, early satiety or unintentional weight loss.  MSK: Negative for joint pain or swelling, back pain, and muscle pain. Derm: Negative for  itching or rash Psych: Denies depression, anxiety, memory loss, confusion. No homicidal or suicidal ideation.  Heme: Negative for prolonged bleeding, bruising easily, and swollen nodes. Endocrine: Negative for cold or heat intolerance, polyuria, polydipsia and goiter. Neuro: negative for tremor, gait imbalance, syncope and seizures. The remainder of the review of systems is noncontributory.  Physical Exam: BP 122/75 (BP Location: Left Arm, Patient Position: Sitting, Cuff Size: Normal)   Pulse 60   Temp 98 F (36.7 C) (Oral)   Ht 5' (1.524 m)   Wt 138 lb 14.4 oz (63 kg)   BMI 27.13 kg/m  General:   Alert and oriented. No distress noted. Pleasant and cooperative.  Head:  Normocephalic and atraumatic. Eyes:  Conjuctiva clear without scleral icterus. Mouth:  Oral mucosa pink and moist. Good dentition. No lesions. Heart: Normal rate and rhythm, s1 and s2 heart sounds present.  Lungs: Clear lung sounds in all lobes. Respirations equal and unlabored. Abdomen:  +BS, soft, non-tender and non-distended. No rebound or guarding. No HSM or masses noted. Derm: No palmar erythema or jaundice Msk:  Symmetrical without gross deformities. Normal posture. Extremities:  Without edema. Neurologic:  Alert and  oriented x4 Psych:  Alert and cooperative. Normal mood and affect.  Invalid input(s): "6 MONTHS"   ASSESSMENT: Kimberly [name] is a 66 y.o. female  presenting today    PLAN:  CT A/P w con 2. Colonoscopy in sept 3. RUQ [place], MELD labs and AFP due in august    Follow Up: F/u in 6 months  Vaudie Engebretsen L. Alver Sorrow, MSN, APRN, AGNP-C Adult-Gerontology Nurse Practitioner [facility]

## 2022-05-17 DIAGNOSIS — R1030 Lower abdominal pain, unspecified: Secondary | ICD-10-CM | POA: Insufficient documentation

## 2022-05-17 DIAGNOSIS — R197 Diarrhea, unspecified: Secondary | ICD-10-CM | POA: Insufficient documentation

## 2022-05-17 DIAGNOSIS — R110 Nausea: Secondary | ICD-10-CM | POA: Insufficient documentation

## 2022-05-20 ENCOUNTER — Ambulatory Visit: Payer: Medicare Other | Admitting: Orthopaedic Surgery

## 2022-05-23 DIAGNOSIS — J449 Chronic obstructive pulmonary disease, unspecified: Secondary | ICD-10-CM | POA: Diagnosis not present

## 2022-05-28 ENCOUNTER — Ambulatory Visit (HOSPITAL_COMMUNITY): Admission: RE | Admit: 2022-05-28 | Payer: Medicare Other | Source: Ambulatory Visit

## 2022-05-31 ENCOUNTER — Ambulatory Visit (HOSPITAL_COMMUNITY)
Admission: RE | Admit: 2022-05-31 | Discharge: 2022-05-31 | Disposition: A | Discharge status: Home / Self Care | Payer: Medicare Other | Source: Ambulatory Visit | Attending: Gastroenterology | Admitting: Gastroenterology

## 2022-05-31 DIAGNOSIS — R197 Diarrhea, unspecified: Secondary | ICD-10-CM | POA: Diagnosis not present

## 2022-05-31 DIAGNOSIS — R1030 Lower abdominal pain, unspecified: Secondary | ICD-10-CM | POA: Diagnosis not present

## 2022-05-31 DIAGNOSIS — K3189 Other diseases of stomach and duodenum: Secondary | ICD-10-CM | POA: Diagnosis not present

## 2022-05-31 MED ORDER — IOHEXOL 300 MG/ML  SOLN
100.0000 mL | Freq: Once | INTRAMUSCULAR | Status: AC | PRN
  Administered 2022-05-31: 100 mL via INTRAVENOUS

## 2022-06-01 LAB — POCT I-STAT CREATININE: Creatinine, Ser: 1.1 mg/dL — ABNORMAL HIGH (ref 0.44–1.00)

## 2022-06-11 DIAGNOSIS — E119 Type 2 diabetes mellitus without complications: Secondary | ICD-10-CM | POA: Diagnosis not present

## 2022-06-11 DIAGNOSIS — R072 Precordial pain: Secondary | ICD-10-CM | POA: Diagnosis not present

## 2022-06-11 DIAGNOSIS — I110 Hypertensive heart disease with heart failure: Secondary | ICD-10-CM | POA: Diagnosis not present

## 2022-06-11 DIAGNOSIS — K7581 Nonalcoholic steatohepatitis (NASH): Secondary | ICD-10-CM | POA: Diagnosis not present

## 2022-06-11 DIAGNOSIS — I509 Heart failure, unspecified: Secondary | ICD-10-CM | POA: Diagnosis not present

## 2022-06-11 DIAGNOSIS — R079 Chest pain, unspecified: Secondary | ICD-10-CM | POA: Diagnosis not present

## 2022-06-14 DIAGNOSIS — E440 Moderate protein-calorie malnutrition: Secondary | ICD-10-CM | POA: Diagnosis not present

## 2022-06-14 DIAGNOSIS — Z299 Encounter for prophylactic measures, unspecified: Secondary | ICD-10-CM | POA: Diagnosis not present

## 2022-06-14 DIAGNOSIS — M25512 Pain in left shoulder: Secondary | ICD-10-CM | POA: Diagnosis not present

## 2022-06-14 DIAGNOSIS — I10 Essential (primary) hypertension: Secondary | ICD-10-CM | POA: Diagnosis not present

## 2022-06-14 DIAGNOSIS — M542 Cervicalgia: Secondary | ICD-10-CM | POA: Diagnosis not present

## 2022-06-23 DIAGNOSIS — J449 Chronic obstructive pulmonary disease, unspecified: Secondary | ICD-10-CM | POA: Diagnosis not present

## 2022-06-24 ENCOUNTER — Telehealth (INDEPENDENT_AMBULATORY_CARE_PROVIDER_SITE_OTHER): Payer: Self-pay | Admitting: *Deleted

## 2022-06-24 ENCOUNTER — Other Ambulatory Visit (INDEPENDENT_AMBULATORY_CARE_PROVIDER_SITE_OTHER): Payer: Self-pay | Admitting: *Deleted

## 2022-06-24 DIAGNOSIS — K7460 Unspecified cirrhosis of liver: Secondary | ICD-10-CM

## 2022-06-24 NOTE — Telephone Encounter (Signed)
8/3 pt notified she is due to have labs in august and states she will do on the 16th when she does her ultrasound. Labs released and placed up front for her to pick up.

## 2022-06-29 DIAGNOSIS — H43823 Vitreomacular adhesion, bilateral: Secondary | ICD-10-CM | POA: Diagnosis not present

## 2022-06-29 DIAGNOSIS — H353231 Exudative age-related macular degeneration, bilateral, with active choroidal neovascularization: Secondary | ICD-10-CM | POA: Diagnosis not present

## 2022-06-29 DIAGNOSIS — E113293 Type 2 diabetes mellitus with mild nonproliferative diabetic retinopathy without macular edema, bilateral: Secondary | ICD-10-CM | POA: Diagnosis not present

## 2022-06-30 DIAGNOSIS — R110 Nausea: Secondary | ICD-10-CM | POA: Diagnosis not present

## 2022-06-30 DIAGNOSIS — R197 Diarrhea, unspecified: Secondary | ICD-10-CM | POA: Diagnosis not present

## 2022-06-30 DIAGNOSIS — E569 Vitamin deficiency, unspecified: Secondary | ICD-10-CM | POA: Diagnosis not present

## 2022-06-30 DIAGNOSIS — E43 Unspecified severe protein-calorie malnutrition: Secondary | ICD-10-CM | POA: Diagnosis not present

## 2022-06-30 DIAGNOSIS — Z9884 Bariatric surgery status: Secondary | ICD-10-CM | POA: Diagnosis not present

## 2022-07-01 DIAGNOSIS — M25511 Pain in right shoulder: Secondary | ICD-10-CM | POA: Diagnosis not present

## 2022-07-01 DIAGNOSIS — M25512 Pain in left shoulder: Secondary | ICD-10-CM | POA: Diagnosis not present

## 2022-07-05 ENCOUNTER — Ambulatory Visit (INDEPENDENT_AMBULATORY_CARE_PROVIDER_SITE_OTHER): Payer: Medicare Other | Admitting: Gastroenterology

## 2022-07-06 ENCOUNTER — Ambulatory Visit (INDEPENDENT_AMBULATORY_CARE_PROVIDER_SITE_OTHER): Payer: Medicare Other | Admitting: Gastroenterology

## 2022-07-07 ENCOUNTER — Ambulatory Visit (HOSPITAL_COMMUNITY)
Admission: RE | Admit: 2022-07-07 | Discharge: 2022-07-07 | Disposition: A | Discharge status: Home / Self Care | Payer: Medicare Other | Source: Ambulatory Visit | Attending: Gastroenterology | Admitting: Gastroenterology

## 2022-07-07 DIAGNOSIS — K7581 Nonalcoholic steatohepatitis (NASH): Secondary | ICD-10-CM | POA: Insufficient documentation

## 2022-07-07 DIAGNOSIS — K7460 Unspecified cirrhosis of liver: Secondary | ICD-10-CM | POA: Diagnosis not present

## 2022-07-09 DIAGNOSIS — M25512 Pain in left shoulder: Secondary | ICD-10-CM | POA: Diagnosis not present

## 2022-07-09 LAB — AFP TUMOR MARKER: AFP-Tumor Marker: 3.6 ng/mL

## 2022-07-09 LAB — COMPREHENSIVE METABOLIC PANEL
AG Ratio: 1.7 (calc) (ref 1.0–2.5)
ALT: 41 U/L — ABNORMAL HIGH (ref 6–29)
AST: 54 U/L — ABNORMAL HIGH (ref 10–35)
Albumin: 4.1 g/dL (ref 3.6–5.1)
Alkaline phosphatase (APISO): 51 U/L (ref 37–153)
BUN/Creatinine Ratio: 17 (calc) (ref 6–22)
BUN: 20 mg/dL (ref 7–25)
CO2: 22 mmol/L (ref 20–32)
Calcium: 8.7 mg/dL (ref 8.6–10.4)
Chloride: 116 mmol/L — ABNORMAL HIGH (ref 98–110)
Creat: 1.15 mg/dL — ABNORMAL HIGH (ref 0.50–1.05)
Globulin: 2.4 g/dL (calc) (ref 1.9–3.7)
Glucose, Bld: 91 mg/dL (ref 65–99)
Potassium: 3.9 mmol/L (ref 3.5–5.3)
Sodium: 145 mmol/L (ref 135–146)
Total Bilirubin: 1.5 mg/dL — ABNORMAL HIGH (ref 0.2–1.2)
Total Protein: 6.5 g/dL (ref 6.1–8.1)

## 2022-07-09 LAB — CBC
HCT: 37.1 % (ref 35.0–45.0)
Hemoglobin: 12.8 g/dL (ref 11.7–15.5)
MCH: 33.8 pg — ABNORMAL HIGH (ref 27.0–33.0)
MCHC: 34.5 g/dL (ref 32.0–36.0)
MCV: 97.9 fL (ref 80.0–100.0)
MPV: 11.4 fL (ref 7.5–12.5)
Platelets: 127 10*3/uL — ABNORMAL LOW (ref 140–400)
RBC: 3.79 10*6/uL — ABNORMAL LOW (ref 3.80–5.10)
RDW: 13.1 % (ref 11.0–15.0)
WBC: 4.5 10*3/uL (ref 3.8–10.8)

## 2022-07-09 LAB — PROTIME-INR
INR: 1
Prothrombin Time: 10.8 s (ref 9.0–11.5)

## 2022-07-13 DIAGNOSIS — D696 Thrombocytopenia, unspecified: Secondary | ICD-10-CM | POA: Diagnosis not present

## 2022-07-13 DIAGNOSIS — Z299 Encounter for prophylactic measures, unspecified: Secondary | ICD-10-CM | POA: Diagnosis not present

## 2022-07-13 DIAGNOSIS — I10 Essential (primary) hypertension: Secondary | ICD-10-CM | POA: Diagnosis not present

## 2022-07-13 DIAGNOSIS — Z713 Dietary counseling and surveillance: Secondary | ICD-10-CM | POA: Diagnosis not present

## 2022-07-15 DIAGNOSIS — R531 Weakness: Secondary | ICD-10-CM | POA: Diagnosis not present

## 2022-07-15 DIAGNOSIS — M25612 Stiffness of left shoulder, not elsewhere classified: Secondary | ICD-10-CM | POA: Diagnosis not present

## 2022-07-15 DIAGNOSIS — M25512 Pain in left shoulder: Secondary | ICD-10-CM | POA: Diagnosis not present

## 2022-07-15 DIAGNOSIS — M25511 Pain in right shoulder: Secondary | ICD-10-CM | POA: Diagnosis not present

## 2022-07-15 DIAGNOSIS — M25611 Stiffness of right shoulder, not elsewhere classified: Secondary | ICD-10-CM | POA: Diagnosis not present

## 2022-07-16 ENCOUNTER — Encounter (INDEPENDENT_AMBULATORY_CARE_PROVIDER_SITE_OTHER): Payer: Self-pay | Admitting: *Deleted

## 2022-07-17 IMAGING — MG MM DIGITAL SCREENING BILAT W/ TOMO AND CAD
8 series · 8 of 24 positions shown · non-contrast
Comparison: Previous exam(s).

CLINICAL DATA: Screening.

EXAM:
DIGITAL SCREENING BILATERAL MAMMOGRAM WITH TOMOSYNTHESIS AND CAD
TECHNIQUE: Bilateral screening digital craniocaudal and mediolateral oblique
mammograms were obtained. Bilateral screening digital breast
tomosynthesis was performed. The images were evaluated with
computer-aided detection.

[L CC synth-2D]
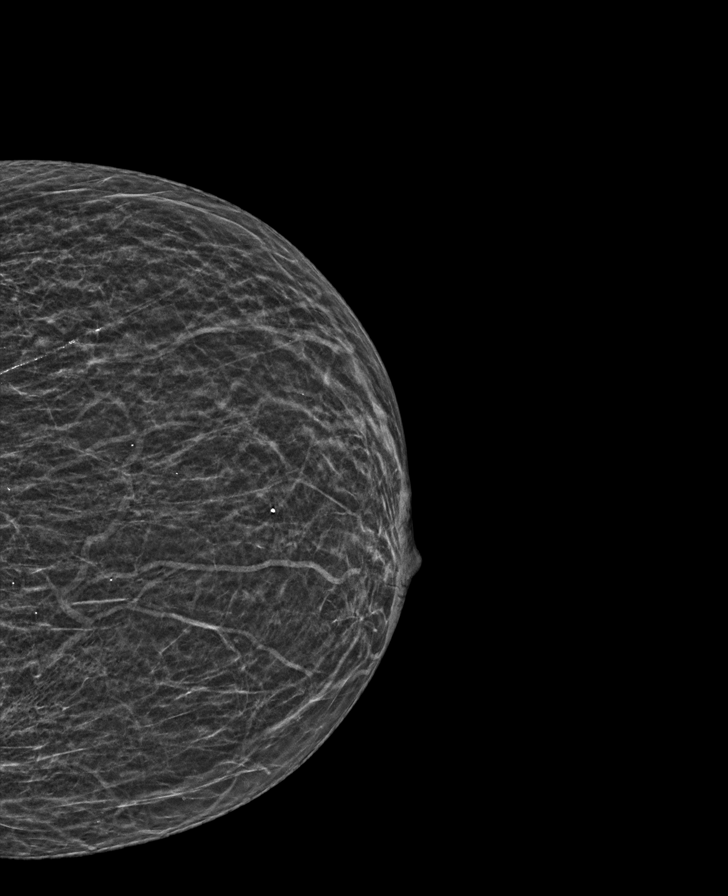

[R CC synth-2D]
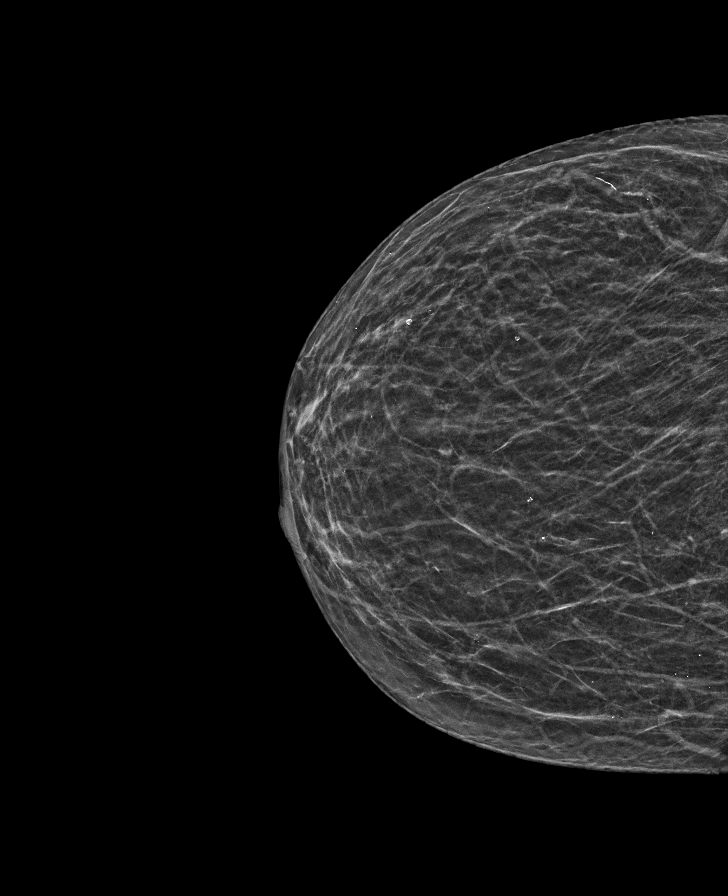

[L MLO synth-2D]
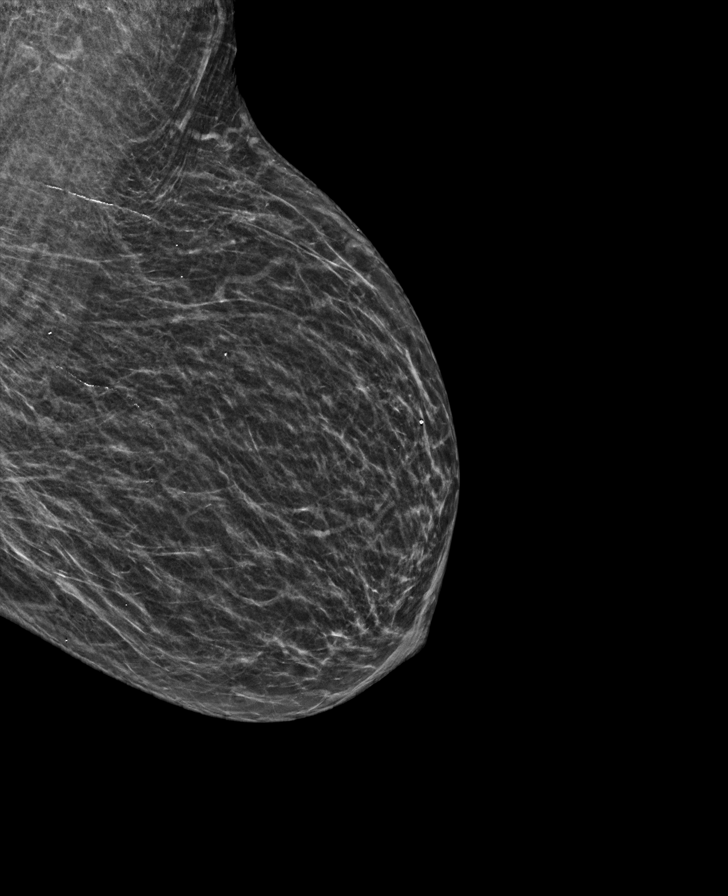

[R MLO synth-2D]
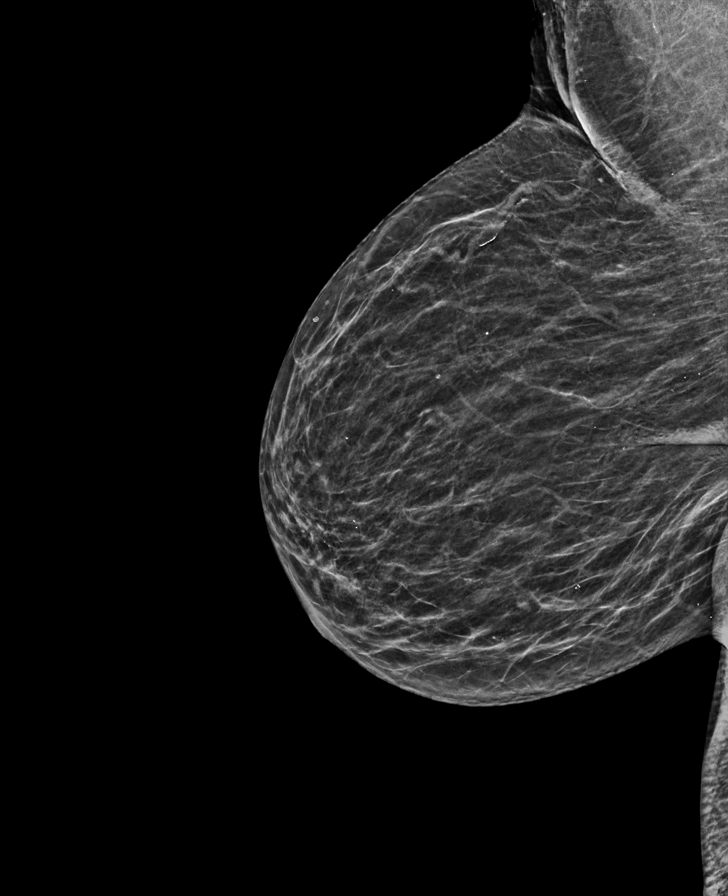

[L MLO tomo · tomo slice 23/45.0]
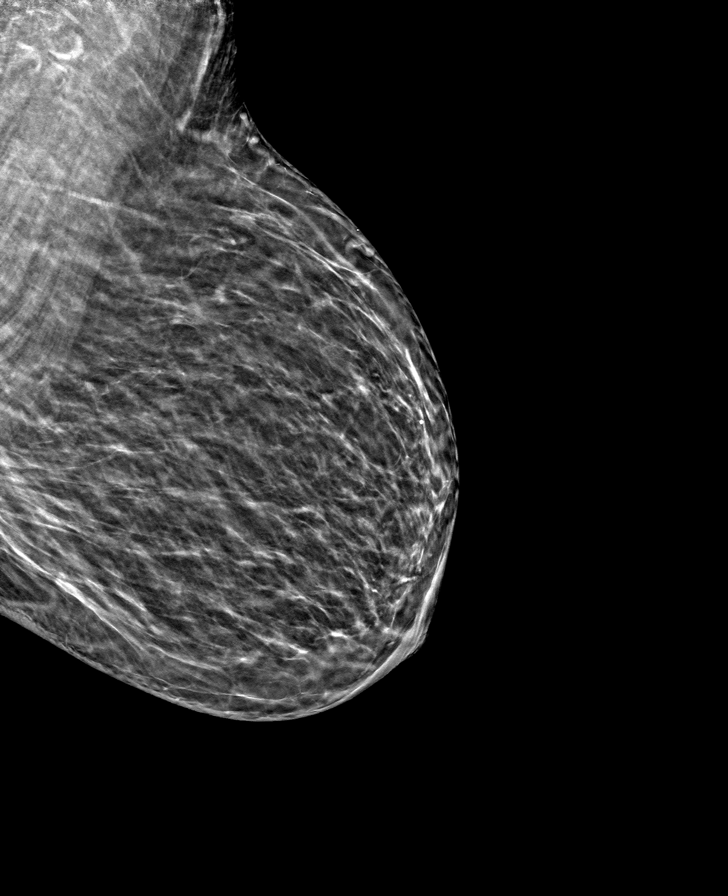

[L CC tomo · tomo slice 18/35.0]
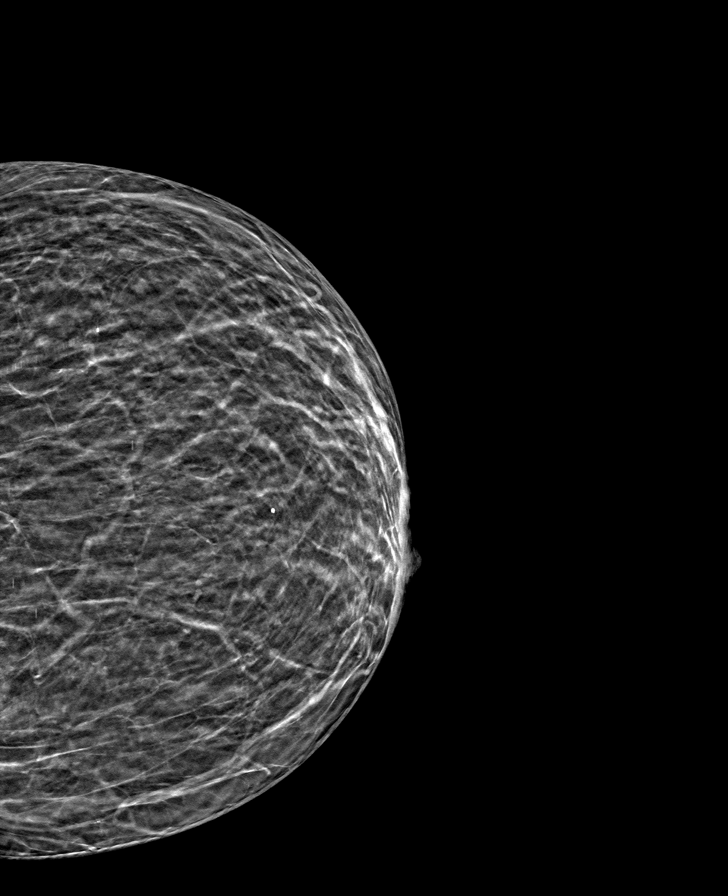

[R MLO tomo · tomo slice 24/47.0]
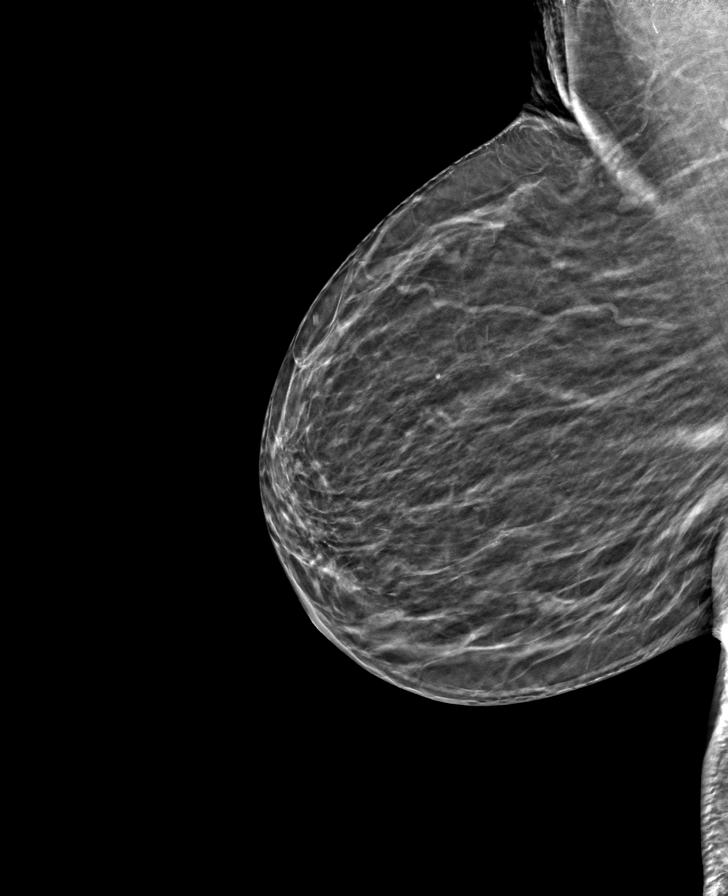

[R CC tomo · tomo slice 21/42.0]
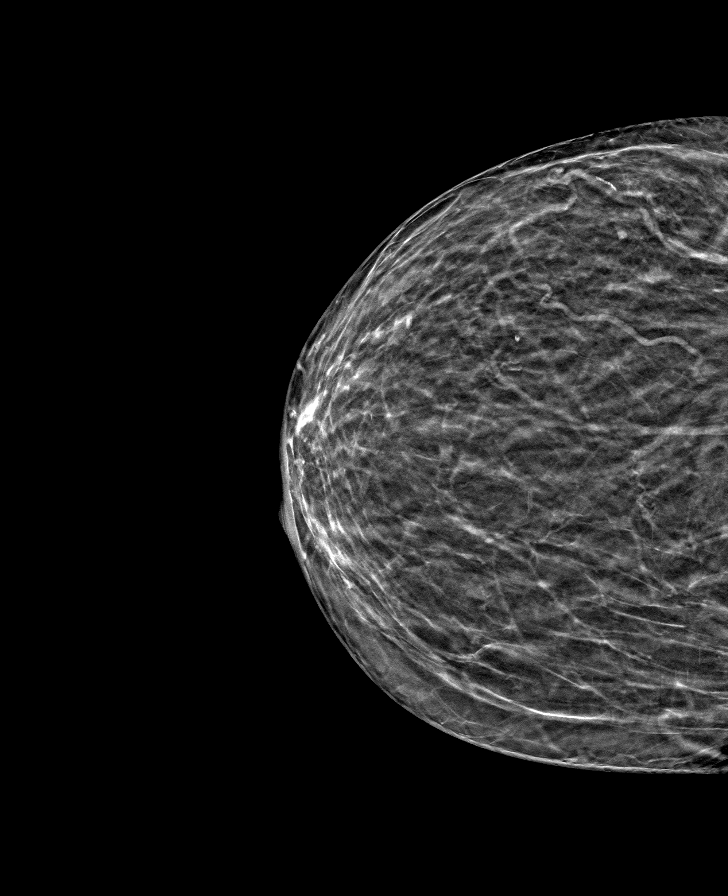

[8 of 24 positions shown; findings below may reference images not displayed]

ACR Breast Density Category b: There are scattered areas of
fibroglandular density.
FINDINGS: There are no findings suspicious for malignancy.
IMPRESSION: No mammographic evidence of malignancy. A result letter of this
screening mammogram will be mailed directly to the patient.

RECOMMENDATION:
Screening mammogram in one year. (Code:51-O-LD2)

BI-RADS CATEGORY  1: Negative.

## 2022-07-19 DIAGNOSIS — M25611 Stiffness of right shoulder, not elsewhere classified: Secondary | ICD-10-CM | POA: Diagnosis not present

## 2022-07-19 DIAGNOSIS — M25612 Stiffness of left shoulder, not elsewhere classified: Secondary | ICD-10-CM | POA: Diagnosis not present

## 2022-07-19 DIAGNOSIS — R531 Weakness: Secondary | ICD-10-CM | POA: Diagnosis not present

## 2022-07-19 DIAGNOSIS — M25512 Pain in left shoulder: Secondary | ICD-10-CM | POA: Diagnosis not present

## 2022-07-19 DIAGNOSIS — M25511 Pain in right shoulder: Secondary | ICD-10-CM | POA: Diagnosis not present

## 2022-07-20 DIAGNOSIS — M25511 Pain in right shoulder: Secondary | ICD-10-CM | POA: Diagnosis not present

## 2022-07-20 DIAGNOSIS — M75102 Unspecified rotator cuff tear or rupture of left shoulder, not specified as traumatic: Secondary | ICD-10-CM | POA: Diagnosis not present

## 2022-07-20 DIAGNOSIS — M25512 Pain in left shoulder: Secondary | ICD-10-CM | POA: Diagnosis not present

## 2022-07-22 DIAGNOSIS — R531 Weakness: Secondary | ICD-10-CM | POA: Diagnosis not present

## 2022-07-22 DIAGNOSIS — M810 Age-related osteoporosis without current pathological fracture: Secondary | ICD-10-CM | POA: Diagnosis not present

## 2022-07-22 DIAGNOSIS — M25611 Stiffness of right shoulder, not elsewhere classified: Secondary | ICD-10-CM | POA: Diagnosis not present

## 2022-07-22 DIAGNOSIS — I10 Essential (primary) hypertension: Secondary | ICD-10-CM | POA: Diagnosis not present

## 2022-07-22 DIAGNOSIS — M25612 Stiffness of left shoulder, not elsewhere classified: Secondary | ICD-10-CM | POA: Diagnosis not present

## 2022-07-22 DIAGNOSIS — M25512 Pain in left shoulder: Secondary | ICD-10-CM | POA: Diagnosis not present

## 2022-07-22 DIAGNOSIS — M25511 Pain in right shoulder: Secondary | ICD-10-CM | POA: Diagnosis not present

## 2022-07-24 DIAGNOSIS — J449 Chronic obstructive pulmonary disease, unspecified: Secondary | ICD-10-CM | POA: Diagnosis not present

## 2022-07-27 DIAGNOSIS — M25511 Pain in right shoulder: Secondary | ICD-10-CM | POA: Diagnosis not present

## 2022-07-27 DIAGNOSIS — M25611 Stiffness of right shoulder, not elsewhere classified: Secondary | ICD-10-CM | POA: Diagnosis not present

## 2022-07-27 DIAGNOSIS — M25612 Stiffness of left shoulder, not elsewhere classified: Secondary | ICD-10-CM | POA: Diagnosis not present

## 2022-07-27 DIAGNOSIS — R531 Weakness: Secondary | ICD-10-CM | POA: Diagnosis not present

## 2022-07-27 DIAGNOSIS — M25512 Pain in left shoulder: Secondary | ICD-10-CM | POA: Diagnosis not present

## 2022-07-29 DIAGNOSIS — M25612 Stiffness of left shoulder, not elsewhere classified: Secondary | ICD-10-CM | POA: Diagnosis not present

## 2022-07-29 DIAGNOSIS — M25511 Pain in right shoulder: Secondary | ICD-10-CM | POA: Diagnosis not present

## 2022-07-29 DIAGNOSIS — M25611 Stiffness of right shoulder, not elsewhere classified: Secondary | ICD-10-CM | POA: Diagnosis not present

## 2022-07-29 DIAGNOSIS — M25512 Pain in left shoulder: Secondary | ICD-10-CM | POA: Diagnosis not present

## 2022-07-29 DIAGNOSIS — R531 Weakness: Secondary | ICD-10-CM | POA: Diagnosis not present

## 2022-07-29 NOTE — Progress Notes (Signed)
Surgical Instructions    Your procedure is scheduled on Friday September 15.  Report to [facility] Main Entrance "A" at 10:30 A.M., then check in with the Admitting office.  Call this number if you have problems the morning of surgery:  [phone]   If you have any questions prior to your surgery date call [phone]: Open Monday-Friday 8am-4pm    Remember:  Do not eat after midnight the night before your surgery  You may drink clear liquids until 9:30 the morning of your surgery.   Clear liquids allowed are: Water, Non-Citrus Juices (without pulp), Carbonated Beverages, Clear Tea, Black Coffee ONLY (NO MILK, CREAM OR POWDERED CREAMER of any kind), and Gatorade    Take these medicines the morning of surgery with A SIP OF WATER:  atorvastatin (LIPITOR) BREO ELLIPTA levothyroxine (SYNTHROID) olopatadine (PATANOL)  pantoprazole (PROTONIX)  IF NEEDED: albuterol (VENTOLIN HFA) cyclobenzaprine (FLEXERIL) HYDROcodone-acetaminophen (NORCO/VICODIN)  ondansetron (ZOFRAN)  Please bring all inhalers with you the day of surgery.   As of today, STOP taking any Aspirin (unless otherwise instructed by your surgeon) Aleve, Naproxen, Ibuprofen, Motrin, Advil, Goody's, BC's, all herbal medications, fish oil, and all vitamins.           Do not wear jewelry or makeup. Do not wear lotions, powders, perfumes or deodorant. Do not shave 48 hours prior to surgery. Do not bring valuables to the hospital. [facility] is not responsible for any belongings or valuables.   Do not wear nail polish, gel polish, artificial nails, or any other type of covering on natural nails (fingers and toes) If you have artificial nails or gel coating that need to be removed by a nail salon, please have this removed prior to surgery. Artificial nails or gel coating may interfere with anesthesia's ability to adequately monitor your vital signs.  Do NOT Smoke (Tobacco/Vaping)  24 hours prior to your procedure  If you  use a CPAP at night, you may bring your mask for your overnight stay.   Contacts, glasses, hearing aids, dentures or partials may not be worn into surgery, please bring cases for these belongings   For patients admitted to the hospital, discharge time will be determined by your treatment team.   Patients discharged the day of surgery will not be allowed to drive home, and someone needs to stay with them for 24 hours.   SURGICAL WAITING ROOM VISITATION Patients having surgery or a procedure may have no more than 2 support people in the waiting area - these visitors may rotate.   Children under the age of 110 must have an adult with them who is not the patient. If the patient needs to stay at the hospital during part of their recovery, the visitor guidelines for inpatient rooms apply. Pre-op nurse will coordinate an appropriate time for 1 support person to accompany patient in pre-op.  This support person may not rotate.   Please refer to the [facility] website for the visitor guidelines for Inpatients (after your surgery is over and you are in a regular room).    Special instructions:   - Preparing for Shoulder Surgery  ?  Before surgery, you can play an important role. Because skin is not sterile, your skin needs to be as free of germs as possible. You can reduce the number of germs on your skin by using the following products.   1). Benzoyl Peroxide Gel: reduces the number of germs present on the skin   *Applied twice a day to  shoulder area starting two days before surgery     2). Chlorhexidine Gluconate (CHG) Soap: An antiseptic cleaner that kills germs and bonds with the skin to continue killing germs even after washing   *Used for showering the night before surgery and morning of surgery     Please follow these instructions carefully:     1). BENZOYL PEROXIDE 5% GEL (Please do not use if you have an allergy to benzoyl peroxide.   If your skin becomes  reddened/irritated stop using the benzoyl peroxide)     Starting TWO DAYS BEFORE surgery:    Apply benzoyl peroxide in the morning and at night. Apply after taking a shower. If you are not taking a shower clean entire shoulder front, back, and side along with the armpit with a clean wet washcloth.     Place a quarter-sized amount of gel on your shoulder and rub in thoroughly, making sure to cover the front, back, and side of your shoulder, along with the armpit.                           Do this twice a day for two days.  (Last application is the night before surgery, AFTER using the CHG soap as described below).   Do NOT apply benzoyl peroxide gel on the day of surgery.   2 days before ____ AM   ____ PM              1 day before ____ AM   ____ PM    2) CHG Soap: Please do not use if you have an allergy to CHG or antibacterial soaps. If your skin becomes reddened/irritated stop using the CHG.  Do not shave (including legs and underarms) for at least 48 hours prior to first CHG shower. It is OK to shave your face.  Please follow these instructions carefully.   Shower the NIGHT BEFORE SURGERY (before applying benzoyl peroxide gel) and the MORNING OF SURGERY with CHG Soap.   If you chose to wash your hair, wash your hair first as usual with your normal shampoo.  After you shampoo, rinse your hair and body thoroughly to remove the shampoo.  Use CHG as you would any other liquid soap. You can apply CHG directly to the skin and wash gently with a scrungie or a clean washcloth.   Apply the CHG Soap to your body ONLY FROM THE NECK DOWN.  Do not use on open wounds or open sores. Avoid contact with your eyes, ears, mouth and genitals (private parts). Wash Face and genitals (private parts) with your normal soap.   Wash thoroughly, paying special attention to the area where your surgery will be performed.  Thoroughly rinse your body with warm water from the neck down.  DO NOT shower/wash  with your normal soap after using and rinsing off the CHG Soap.  Pat yourself dry with a CLEAN TOWEL.  Wear CLEAN PAJAMAS to bed the night before surgery  Place CLEAN SHEETS on your bed the night of your first shower and DO NOT SLEEP WITH PETS.  Oral Hygiene is also important to reduce your risk of infection.  Remember - BRUSH YOUR TEETH THE MORNING OF SURGERY WITH YOUR REGULAR TOOTHPASTE  Day of Surgery: Wear Clean/Comfortable clothing the morning of surgery Do not apply any deodorants/lotions.   Remember to brush your teeth WITH YOUR REGULAR TOOTHPASTE.    If you received a COVID test during your  pre-op visit, it is requested that you wear a mask when out in public, stay away from anyone that may not be feeling well, and notify your surgeon if you develop symptoms. If you have been in contact with anyone that has tested positive in the last 10 days, please notify your surgeon.    Please read over the following fact sheets that you were given.

## 2022-07-30 ENCOUNTER — Encounter (HOSPITAL_COMMUNITY)
Admission: RE | Admit: 2022-07-30 | Discharge: 2022-07-30 | Disposition: A | Discharge status: Home / Self Care | Payer: Medicare Other | Source: Ambulatory Visit | Attending: Orthopedic Surgery | Admitting: Orthopedic Surgery

## 2022-07-30 ENCOUNTER — Other Ambulatory Visit: Payer: Self-pay

## 2022-07-30 ENCOUNTER — Encounter (HOSPITAL_COMMUNITY): Payer: Self-pay

## 2022-07-30 VITALS — BP 131/76 | HR 62 | Temp 98.2°F | Resp 17 | Ht 60.0 in | Wt 136.4 lb

## 2022-07-30 DIAGNOSIS — R001 Bradycardia, unspecified: Secondary | ICD-10-CM | POA: Diagnosis not present

## 2022-07-30 DIAGNOSIS — E1159 Type 2 diabetes mellitus with other circulatory complications: Secondary | ICD-10-CM | POA: Diagnosis not present

## 2022-07-30 DIAGNOSIS — Z01812 Encounter for preprocedural laboratory examination: Secondary | ICD-10-CM | POA: Insufficient documentation

## 2022-07-30 DIAGNOSIS — K7469 Other cirrhosis of liver: Secondary | ICD-10-CM | POA: Diagnosis not present

## 2022-07-30 DIAGNOSIS — Z7951 Long term (current) use of inhaled steroids: Secondary | ICD-10-CM | POA: Diagnosis not present

## 2022-07-30 DIAGNOSIS — R0789 Other chest pain: Secondary | ICD-10-CM | POA: Diagnosis not present

## 2022-07-30 DIAGNOSIS — Z01818 Encounter for other preprocedural examination: Secondary | ICD-10-CM

## 2022-07-30 DIAGNOSIS — Z79899 Other long term (current) drug therapy: Secondary | ICD-10-CM | POA: Diagnosis not present

## 2022-07-30 HISTORY — DX: Other specified postprocedural states: R11.2

## 2022-07-30 HISTORY — DX: Other specified postprocedural states: Z98.890

## 2022-07-30 LAB — SURGICAL PCR SCREEN
MRSA, PCR: NEGATIVE
Staphylococcus aureus: NEGATIVE

## 2022-07-30 LAB — HEMOGLOBIN A1C
Hgb A1c MFr Bld: 4 % — ABNORMAL LOW (ref 4.8–5.6)
Mean Plasma Glucose: 68.1 mg/dL

## 2022-07-30 LAB — GLUCOSE, CAPILLARY: Glucose-Capillary: 124 mg/dL — ABNORMAL HIGH (ref 70–99)

## 2022-07-30 NOTE — Progress Notes (Addendum)
PCP - Kimberly [name] Cardiologist - Kimberly [name]  PPM/ICD - denies    Chest x-ray - n/a EKG - 06/11/22- records requested from [place] - 08/17/21 ECHO - 08/05/16 Cardiac Cath - 2009  Sleep Study - used to have OSA before losing weight after bariatric surgery. No longer snores or is diagnosed with OSA   Fasting Blood Sugar - is hypoglycemic at times at night before bed and in the morning. Advised her to check CBG that morning and every 2 hours until she arrives in short stay  As of today, STOP taking any Aspirin (unless otherwise instructed by your surgeon) Aleve, Naproxen, Ibuprofen, Motrin, Advil, Goody's, BC's, all herbal medications, fish oil, and all vitamins.  ERAS Protcol -yes PRE-SURGERY Ensure or G2- provided her with ensure. Clear liquids until 9:30am  COVID TEST- not needed   Anesthesia review: yes, cardiac history. Review EKG tracing requested from [facility]  Patient denies shortness of breath, fever, cough and chest pain at PAT appointment   All instructions explained to the patient, with a verbal understanding of the material. Patient agrees to go over the instructions while at home for a better understanding. Patient also instructed to self quarantine after being tested for COVID-19. The opportunity to ask questions was provided.

## 2022-08-02 ENCOUNTER — Encounter (HOSPITAL_COMMUNITY): Payer: Self-pay

## 2022-08-02 NOTE — Progress Notes (Signed)
Anesthesia Chart Review:  Long history of chest pain with negative ischemic evaluations.  History of false positive stress 04/2007, cath showed nonobstructive disease.  Negative stress echo 2012.  Nuclear stress 06/2014 showed no ischemia.  Echo 07/2016 showed LVEF 55 to 60%, no WMAs.  Last seen by Dr. Carlyle Dolly 08/05/2021 again for evaluation of chest pain.  Nuclear stress ordered at that time was low risk, EF 66%.  Follows with gastroenterology for history of compensated Nash cirrhosis.  History of OSA, reportedly resolved after weight loss following bariatric surgery.  Prior history of asthma attack/bronchospasm following surgery she is maintained on Breo Ellipta and as needed albuterol.  Preop labs reviewed, creatinine mildly elevated 1.15, chronic mildly elevated total bilirubin at 1.5, mild chronic thrombocytopenia with platelets 127k, chronic mildly elevated AST/ALT at 54/41, otherwise unremarkable  EKG 06/11/2022 (Care Everywhere, tracing requested): Sinus bradycardia.  Rate 56.  Rightward axis.  Nuclear stress 08/17/2021:   The study is low risk.   No ST deviation was noted.   LV perfusion is normal.   Left ventricular function is normal. Nuclear stress EF: 66 %.   Patient unable to navigate treadmill due to knee pain, stress modality was switched to [place].  No significant perfusion defects on stress imaging in the setting of breast attenuation and LVEF normal at 66%.  Low risk study.  TTE 08/05/2016: - Left ventricle: The cavity size was normal. Wall thickness was    increased in a pattern of moderate LVH. Systolic function was    normal. The estimated ejection fraction was in the range of 55%    to 60%. Wall motion was normal; there were no regional wall    motion abnormalities. Left ventricular diastolic function    parameters were normal.  - Aortic valve: Valve area (VTI): 1.77 cm^2. Valve area (Vmax): 1.6    cm^2.  - Technically adequate study.       Wynonia Musty [facility] Short Stay Center/Anesthesiology Phone [phone] 08/02/2022 4:32 PM

## 2022-08-02 NOTE — Anesthesia Preprocedure Evaluation (Addendum)
Anesthesia Evaluation  Patient identified by MRN, date of birth, ID band Patient awake    Reviewed: Allergy & Precautions, NPO status , Patient's Chart, lab work & pertinent test results  History of Anesthesia Complications Negative for: history of anesthetic complications  Airway Mallampati: II  TM Distance: >3 FB Neck ROM: Full    Dental  (+) Dental Advisory Given, Teeth Intact   Pulmonary asthma , sleep apnea , COPD,    Pulmonary exam normal        Cardiovascular hypertension, + CAD (nonobstructive by cath)  Normal cardiovascular exam  Nuclear stress 08/17/2021: . The study is low risk. . No ST deviation was noted. . LV perfusion is normal. . Left ventricular function is normal. Nuclear stress EF: 66 %.  TTE 08/05/2016: - Left ventricle: The cavity size was normal. Wall thickness was increased in a pattern of moderate LVH. Systolic function was normal. The estimated ejection fraction was in the range of 55% to 60%. Wall motion was normal; there were no regional wall motion abnormalities. Left ventricular diastolic function parameters were normal.  - Aortic valve: Valve area (VTI): 1.77 cm^2. Valve area (Vmax): 1.6 cm^2.  - Technically adequate study.    Neuro/Psych Seizures -,  CVA    GI/Hepatic hiatal hernia, GERD  ,(+) Cirrhosis  ([facility])      ,   Endo/Other  diabetesHypothyroidism   Renal/GU negative Renal ROS  negative genitourinary   Musculoskeletal  (+) Arthritis , Fibromyalgia -  Abdominal   Peds  Hematology negative hematology ROS (+)   Anesthesia Other Findings   Reproductive/Obstetrics                           Anesthesia Physical Anesthesia Plan  ASA: 3  Anesthesia Plan: General   Post-op Pain Management: Regional block*   Induction: Intravenous  PONV Risk Score and Plan: 3 and Ondansetron, Dexamethasone, Treatment may vary due to age or medical condition and  Midazolam  Airway Management Planned: Oral ETT  Additional Equipment: None  Intra-op Plan:   Post-operative Plan: Extubation in OR  Informed Consent: I have reviewed the patients History and Physical, chart, labs and discussed the procedure including the risks, benefits and alternatives for the proposed anesthesia with the patient or authorized representative who has indicated his/her understanding and acceptance.     Dental advisory given  Plan Discussed with:   Anesthesia Plan Comments: (PAT note by Karoline Caldwell, PA-C: Long history of chest pain with negative ischemic evaluations.  History of false positive stress 04/2007, cath showed nonobstructive disease.  Negative stress echo 2012.  Nuclear stress 06/2014 showed no ischemia.  Echo 07/2016 showed LVEF 55 to 60%, no WMAs.  Last seen by Dr. Carlyle Dolly 08/05/2021 again for evaluation of chest pain.  Nuclear stress ordered at that time was low risk, EF 66%.  Follows with gastroenterology for history of compensated Nash cirrhosis.  History of OSA, reportedly resolved after weight loss following bariatric surgery.  Prior history of asthma attack/bronchospasm following surgery she is maintained on Breo Ellipta and as needed albuterol.  Preop labs reviewed, creatinine mildly elevated 1.15, chronic mildly elevated total bilirubin at 1.5, mild chronic thrombocytopenia with platelets 127k, chronic mildly elevated AST/ALT at 54/41, otherwise unremarkable  EKG 06/11/2022 (Care Everywhere, tracing requested): Sinus bradycardia.  Rate 56.  Rightward axis.  Nuclear stress 08/17/2021: . The study is low risk. . No ST deviation was noted. . LV perfusion is normal. . Left  ventricular function is normal. Nuclear stress EF: 66 %.  Patient unable to navigate treadmill due to knee pain, stress modality was switched to [place]. No significant perfusion defects on stress imaging in the setting of breast attenuation and LVEF normal at 66%.  Low risk study.  TTE 08/05/2016: - Left ventricle: The cavity size was normal. Wall thickness was  increased in a pattern of moderate LVH. Systolic function was  normal. The estimated ejection fraction was in the range of 55%  to 60%. Wall motion was normal; there were no regional wall  motion abnormalities. Left ventricular diastolic function  parameters were normal.  - Aortic valve: Valve area (VTI): 1.77 cm^2. Valve area (Vmax): 1.6  cm^2.  - Technically adequate study.  )       Anesthesia Quick Evaluation

## 2022-08-06 ENCOUNTER — Ambulatory Visit (HOSPITAL_COMMUNITY)
Admission: RE | Admit: 2022-08-06 | Discharge: 2022-08-06 | Disposition: A | Discharge status: Home / Self Care | Payer: Medicare Other | Attending: Orthopedic Surgery | Admitting: Orthopedic Surgery

## 2022-08-06 ENCOUNTER — Other Ambulatory Visit: Payer: Self-pay

## 2022-08-06 ENCOUNTER — Encounter (HOSPITAL_COMMUNITY)
Admission: RE | Disposition: A | Discharge status: Home / Self Care | Payer: Self-pay | Source: Home / Self Care | Attending: Orthopedic Surgery

## 2022-08-06 ENCOUNTER — Ambulatory Visit (HOSPITAL_COMMUNITY): Payer: Medicare Other | Admitting: Physician Assistant

## 2022-08-06 ENCOUNTER — Ambulatory Visit (HOSPITAL_BASED_OUTPATIENT_CLINIC_OR_DEPARTMENT_OTHER): Payer: Medicare Other | Admitting: Anesthesiology

## 2022-08-06 ENCOUNTER — Encounter (HOSPITAL_COMMUNITY): Payer: Self-pay | Admitting: Orthopedic Surgery

## 2022-08-06 DIAGNOSIS — M7522 Bicipital tendinitis, left shoulder: Secondary | ICD-10-CM

## 2022-08-06 DIAGNOSIS — R569 Unspecified convulsions: Secondary | ICD-10-CM | POA: Insufficient documentation

## 2022-08-06 DIAGNOSIS — M75102 Unspecified rotator cuff tear or rupture of left shoulder, not specified as traumatic: Secondary | ICD-10-CM | POA: Diagnosis not present

## 2022-08-06 DIAGNOSIS — M19012 Primary osteoarthritis, left shoulder: Secondary | ICD-10-CM

## 2022-08-06 DIAGNOSIS — K7460 Unspecified cirrhosis of liver: Secondary | ICD-10-CM | POA: Diagnosis not present

## 2022-08-06 DIAGNOSIS — K7581 Nonalcoholic steatohepatitis (NASH): Secondary | ICD-10-CM | POA: Diagnosis not present

## 2022-08-06 DIAGNOSIS — I5032 Chronic diastolic (congestive) heart failure: Secondary | ICD-10-CM | POA: Diagnosis not present

## 2022-08-06 DIAGNOSIS — M7552 Bursitis of left shoulder: Secondary | ICD-10-CM | POA: Diagnosis not present

## 2022-08-06 DIAGNOSIS — G8918 Other acute postprocedural pain: Secondary | ICD-10-CM | POA: Diagnosis not present

## 2022-08-06 DIAGNOSIS — Z8673 Personal history of transient ischemic attack (TIA), and cerebral infarction without residual deficits: Secondary | ICD-10-CM | POA: Diagnosis not present

## 2022-08-06 DIAGNOSIS — Z8719 Personal history of other diseases of the digestive system: Secondary | ICD-10-CM | POA: Diagnosis not present

## 2022-08-06 DIAGNOSIS — K219 Gastro-esophageal reflux disease without esophagitis: Secondary | ICD-10-CM | POA: Insufficient documentation

## 2022-08-06 DIAGNOSIS — M7542 Impingement syndrome of left shoulder: Secondary | ICD-10-CM | POA: Diagnosis not present

## 2022-08-06 DIAGNOSIS — M25812 Other specified joint disorders, left shoulder: Secondary | ICD-10-CM | POA: Insufficient documentation

## 2022-08-06 DIAGNOSIS — X58XXXA Exposure to other specified factors, initial encounter: Secondary | ICD-10-CM | POA: Insufficient documentation

## 2022-08-06 DIAGNOSIS — M797 Fibromyalgia: Secondary | ICD-10-CM | POA: Insufficient documentation

## 2022-08-06 DIAGNOSIS — I2510 Atherosclerotic heart disease of native coronary artery without angina pectoris: Secondary | ICD-10-CM

## 2022-08-06 DIAGNOSIS — I10 Essential (primary) hypertension: Secondary | ICD-10-CM

## 2022-08-06 DIAGNOSIS — I110 Hypertensive heart disease with heart failure: Secondary | ICD-10-CM | POA: Insufficient documentation

## 2022-08-06 DIAGNOSIS — M75112 Incomplete rotator cuff tear or rupture of left shoulder, not specified as traumatic: Secondary | ICD-10-CM | POA: Diagnosis not present

## 2022-08-06 DIAGNOSIS — S46012A Strain of muscle(s) and tendon(s) of the rotator cuff of left shoulder, initial encounter: Secondary | ICD-10-CM | POA: Insufficient documentation

## 2022-08-06 DIAGNOSIS — J449 Chronic obstructive pulmonary disease, unspecified: Secondary | ICD-10-CM | POA: Diagnosis not present

## 2022-08-06 DIAGNOSIS — G4730 Sleep apnea, unspecified: Secondary | ICD-10-CM | POA: Insufficient documentation

## 2022-08-06 DIAGNOSIS — E1159 Type 2 diabetes mellitus with other circulatory complications: Secondary | ICD-10-CM

## 2022-08-06 DIAGNOSIS — M7520 Bicipital tendinitis, unspecified shoulder: Secondary | ICD-10-CM | POA: Insufficient documentation

## 2022-08-06 DIAGNOSIS — E039 Hypothyroidism, unspecified: Secondary | ICD-10-CM | POA: Diagnosis not present

## 2022-08-06 DIAGNOSIS — S4382XA Sprain of other specified parts of left shoulder girdle, initial encounter: Secondary | ICD-10-CM | POA: Diagnosis not present

## 2022-08-06 HISTORY — PX: SHOULDER ARTHROSCOPY WITH DISTAL CLAVICLE RESECTION: SHX5675

## 2022-08-06 HISTORY — PX: SHOULDER ARTHROSCOPY WITH ROTATOR CUFF REPAIR: SHX5685

## 2022-08-06 HISTORY — PX: SHOULDER ARTHROSCOPY WITH SUBACROMIAL DECOMPRESSION: SHX5684

## 2022-08-06 LAB — GLUCOSE, CAPILLARY
Glucose-Capillary: 77 mg/dL (ref 70–99)
Glucose-Capillary: 83 mg/dL (ref 70–99)
Glucose-Capillary: 90 mg/dL (ref 70–99)

## 2022-08-06 SURGERY — ARTHROSCOPY, SHOULDER, WITH ROTATOR CUFF REPAIR
Anesthesia: General | Site: Shoulder | Laterality: Left

## 2022-08-06 MED ORDER — PROPOFOL 10 MG/ML IV BOLUS
INTRAVENOUS | Status: DC | PRN
  Administered 2022-08-06: 120 mg via INTRAVENOUS

## 2022-08-06 MED ORDER — CHLORHEXIDINE GLUCONATE 0.12 % MT SOLN
15.0000 mL | Freq: Once | OROMUCOSAL | Status: AC
  Administered 2022-08-06: 15 mL via OROMUCOSAL
  Filled 2022-08-06: qty 15

## 2022-08-06 MED ORDER — CEFAZOLIN SODIUM-DEXTROSE 2-4 GM/100ML-% IV SOLN
2.0000 g | INTRAVENOUS | Status: AC
Start: 2022-08-06 — End: 2022-08-06
  Administered 2022-08-06: 2 g via INTRAVENOUS
  Filled 2022-08-06: qty 100

## 2022-08-06 MED ORDER — MIDAZOLAM HCL 2 MG/2ML IJ SOLN
INTRAMUSCULAR | Status: AC
  Administered 2022-08-06: 2 mg via INTRAVENOUS
  Filled 2022-08-06: qty 2

## 2022-08-06 MED ORDER — ONDANSETRON HCL 4 MG/2ML IJ SOLN: 4.0000 mg | Freq: Once | INTRAMUSCULAR | Status: DC | PRN

## 2022-08-06 MED ORDER — DEXAMETHASONE SODIUM PHOSPHATE 10 MG/ML IJ SOLN
INTRAMUSCULAR | Status: DC | PRN
  Administered 2022-08-06: 10 mg via INTRAVENOUS

## 2022-08-06 MED ORDER — PROPOFOL 10 MG/ML IV BOLUS
INTRAVENOUS | Status: AC
  Filled 2022-08-06: qty 20

## 2022-08-06 MED ORDER — EPHEDRINE SULFATE (PRESSORS) 50 MG/ML IJ SOLN
INTRAMUSCULAR | Status: DC | PRN
  Administered 2022-08-06: 5 mg via INTRAVENOUS

## 2022-08-06 MED ORDER — ONDANSETRON HCL 4 MG/2ML IJ SOLN
INTRAMUSCULAR | Status: DC | PRN
  Administered 2022-08-06: 4 mg via INTRAVENOUS

## 2022-08-06 MED ORDER — SODIUM CHLORIDE 0.9 % IR SOLN
Status: DC | PRN
  Administered 2022-08-06 (×3): 3000 mL

## 2022-08-06 MED ORDER — MIDAZOLAM HCL 2 MG/2ML IJ SOLN: 2.0000 mg | Freq: Once | INTRAMUSCULAR | Status: AC

## 2022-08-06 MED ORDER — ROCURONIUM BROMIDE 10 MG/ML (PF) SYRINGE
PREFILLED_SYRINGE | INTRAVENOUS | Status: AC
  Filled 2022-08-06: qty 10

## 2022-08-06 MED ORDER — SUGAMMADEX SODIUM 200 MG/2ML IV SOLN
INTRAVENOUS | Status: DC | PRN
  Administered 2022-08-06: 124.2 mg via INTRAVENOUS

## 2022-08-06 MED ORDER — DEXAMETHASONE SODIUM PHOSPHATE 10 MG/ML IJ SOLN
INTRAMUSCULAR | Status: AC
  Filled 2022-08-06: qty 1

## 2022-08-06 MED ORDER — LACTATED RINGERS IV SOLN: INTRAVENOUS | Status: DC

## 2022-08-06 MED ORDER — MIDAZOLAM HCL 2 MG/2ML IJ SOLN
INTRAMUSCULAR | Status: AC
  Filled 2022-08-06: qty 2

## 2022-08-06 MED ORDER — FENTANYL CITRATE (PF) 100 MCG/2ML IJ SOLN: 25.0000 ug | INTRAMUSCULAR | Status: DC | PRN

## 2022-08-06 MED ORDER — BUPIVACAINE LIPOSOME 1.3 % IJ SUSP
INTRAMUSCULAR | Status: DC | PRN
  Administered 2022-08-06: 10 mL via PERINEURAL

## 2022-08-06 MED ORDER — PHENYLEPHRINE HCL-NACL 20-0.9 MG/250ML-% IV SOLN
INTRAVENOUS | Status: DC | PRN
  Administered 2022-08-06: 25 ug/min via INTRAVENOUS

## 2022-08-06 MED ORDER — ROCURONIUM BROMIDE 10 MG/ML (PF) SYRINGE
PREFILLED_SYRINGE | INTRAVENOUS | Status: DC | PRN
  Administered 2022-08-06: 80 mg via INTRAVENOUS

## 2022-08-06 MED ORDER — FENTANYL CITRATE (PF) 100 MCG/2ML IJ SOLN
INTRAMUSCULAR | Status: AC
  Administered 2022-08-06: 50 ug
  Filled 2022-08-06: qty 2

## 2022-08-06 MED ORDER — AMISULPRIDE (ANTIEMETIC) 5 MG/2ML IV SOLN: 10.0000 mg | Freq: Once | INTRAVENOUS | Status: DC | PRN

## 2022-08-06 MED ORDER — FENTANYL CITRATE (PF) 250 MCG/5ML IJ SOLN
INTRAMUSCULAR | Status: AC
  Filled 2022-08-06: qty 5

## 2022-08-06 MED ORDER — LIDOCAINE 2% (20 MG/ML) 5 ML SYRINGE
INTRAMUSCULAR | Status: AC
  Filled 2022-08-06: qty 5

## 2022-08-06 MED ORDER — EPINEPHRINE PF 1 MG/ML IJ SOLN
INTRAMUSCULAR | Status: DC | PRN
  Administered 2022-08-06 (×2): 1 mg

## 2022-08-06 MED ORDER — EPINEPHRINE PF 1 MG/ML IJ SOLN
INTRAMUSCULAR | Status: AC
  Filled 2022-08-06: qty 2

## 2022-08-06 MED ORDER — BUPIVACAINE HCL (PF) 0.5 % IJ SOLN
INTRAMUSCULAR | Status: DC | PRN
  Administered 2022-08-06: 15 mL via PERINEURAL

## 2022-08-06 MED ORDER — OXYCODONE-ACETAMINOPHEN 7.5-325 MG PO TABS: 1.0000 | ORAL_TABLET | ORAL | 0 refills | Status: DC | PRN

## 2022-08-06 MED ORDER — FENTANYL CITRATE (PF) 100 MCG/2ML IJ SOLN: 50.0000 ug | Freq: Once | INTRAMUSCULAR | Status: DC

## 2022-08-06 MED ORDER — KETAMINE HCL 50 MG/5ML IJ SOSY
PREFILLED_SYRINGE | INTRAMUSCULAR | Status: AC
  Filled 2022-08-06: qty 5

## 2022-08-06 MED ORDER — ORAL CARE MOUTH RINSE: 15.0000 mL | Freq: Once | OROMUCOSAL | Status: AC

## 2022-08-06 MED ORDER — FENTANYL CITRATE (PF) 250 MCG/5ML IJ SOLN
INTRAMUSCULAR | Status: DC | PRN
  Administered 2022-08-06: 100 ug via INTRAVENOUS

## 2022-08-06 MED ORDER — ONDANSETRON HCL 4 MG/2ML IJ SOLN
INTRAMUSCULAR | Status: AC
  Filled 2022-08-06: qty 2

## 2022-08-06 SURGICAL SUPPLY — 50 items
ABDOMINAL PAD ABD IMPLANT
ANCH SUT 2 SWLK 19.1 CLS EYLT (Anchor) ×1 IMPLANT
ANCH SUT TGRTAPE 1.3X2.6X1.7 (Anchor) ×1 IMPLANT
ANCHOR SUT FBRTK 2.6 SP1.7 TAP (Anchor) IMPLANT
ANCHOR SWIVELOCK BIO 4.75X19.1 (Anchor) IMPLANT
BAG COUNTER SPONGE SURGICOUNT (BAG) ×1 IMPLANT
BAG SPNG CNTER NS LX DISP (BAG) ×1
BLADE SURG 11 STRL SS (BLADE) ×1 IMPLANT
BURR OVAL 8 FLU 4.0X13 (MISCELLANEOUS) IMPLANT
CANNULA PASSPORT BUTTON 10-40 (CANNULA) IMPLANT
CANNULA TWIST IN 8.25X7CM (CANNULA) ×1 IMPLANT
CLOSURE STERI STRIP 1/2 X4 (GAUZE/BANDAGES/DRESSINGS) IMPLANT
CUTTER BONE 4.0MM X 13CM (MISCELLANEOUS) IMPLANT
DRAPE INCISE IOBAN 66X45 STRL (DRAPES) IMPLANT
DRAPE STERI 35X30 U-POUCH (DRAPES) ×1 IMPLANT
DRAPE U-SHAPE 47X51 STRL (DRAPES) ×1 IMPLANT
DURAPREP 26ML APPLICATOR (WOUND CARE) ×1 IMPLANT
GAUZE PAD ABD 8X10 STRL (GAUZE/BANDAGES/DRESSINGS) ×3 IMPLANT
GAUZE SPONGE 4X4 12PLY STRL (GAUZE/BANDAGES/DRESSINGS) ×1 IMPLANT
GLOVE BIO SURGEON STRL SZ7.5 (GLOVE) ×2 IMPLANT
GLOVE BIOGEL PI IND STRL 8 (GLOVE) ×1 IMPLANT
GOWN STRL REUS W/ TWL LRG LVL3 (GOWN DISPOSABLE) ×1 IMPLANT
GOWN STRL REUS W/TWL LRG LVL3 (GOWN DISPOSABLE) ×1
GOWN STRL REUS W/TWL XL LVL3 (GOWN DISPOSABLE) ×2 IMPLANT
KIT BASIN OR (CUSTOM PROCEDURE TRAY) ×1 IMPLANT
KIT TURNOVER KIT B (KITS) ×1 IMPLANT
MANIFOLD NEPTUNE II (INSTRUMENTS) ×1 IMPLANT
NDL SCORPION MULTI FIRE (NEEDLE) IMPLANT
NDL SPNL 18GX3.5 QUINCKE PK (NEEDLE) ×1 IMPLANT
NEEDLE SCORPION MULTI FIRE (NEEDLE) IMPLANT
NEEDLE SPNL 18GX3.5 QUINCKE PK (NEEDLE) ×1 IMPLANT
NS IRRIG 1000ML POUR BTL (IV SOLUTION) ×1 IMPLANT
PACK SHOULDER (CUSTOM PROCEDURE TRAY) ×1 IMPLANT
PAD ARMBOARD 7.5X6 YLW CONV (MISCELLANEOUS) ×2 IMPLANT
PORT APPOLLO RF 90DEGREE MULTI (SURGICAL WAND) IMPLANT
PROBE BIPOLAR ATHRO 135MM 90D (MISCELLANEOUS) IMPLANT
SLEEVE ARM SUSPENSION SYSTEM (MISCELLANEOUS) ×1 IMPLANT
SLING ARM IMMOBILIZER MED (SOFTGOODS) IMPLANT
SLING S3 LATERAL DISP (MISCELLANEOUS) ×1 IMPLANT
SPONGE T-LAP 4X18 ~~LOC~~+RFID (SPONGE) ×1 IMPLANT
SUT ETHILON 3 0 PS 1 (SUTURE) ×1 IMPLANT
SUT MNCRL AB 3-0 PS2 18 (SUTURE) IMPLANT
SUT TIGER TAPE 7 IN WHITE (SUTURE) IMPLANT
TAPE CLOTH SURG 6X10 WHT LF (GAUZE/BANDAGES/DRESSINGS) IMPLANT
TAPE FIBER 2MM 7IN #2 BLUE (SUTURE) IMPLANT
TAPE PAPER 3X10 WHT MICROPORE (GAUZE/BANDAGES/DRESSINGS) ×1 IMPLANT
TOWEL GREEN STERILE (TOWEL DISPOSABLE) ×1 IMPLANT
TOWEL GREEN STERILE FF (TOWEL DISPOSABLE) ×1 IMPLANT
TUBING ARTHROSCOPY IRRIG 16FT (MISCELLANEOUS) ×1 IMPLANT
WATER STERILE IRR 1000ML POUR (IV SOLUTION) ×1 IMPLANT

## 2022-08-06 NOTE — H&P (Signed)
ORTHOPAEDIC H&P  REQUESTING PHYSICIAN: Nicholes Stairs, MD  PCP:  Glenda Chroman, MD  Chief Complaint: Left shoulder pain  HPI: Kimberly [name] is a 66 y.o. female who complains of left shoulder pain and weakness.  Here today for arthroscopic rotator cuff repair.  No new complaints at this time.  Past Medical History:  Diagnosis Date   Anemia    PMH: as a child   Arthritis    Asthma    CAD (coronary artery disease)    nonobstructive by cath, 6/08 (false positive Cardiolite) normal stress echo, 7/11   CHF (congestive heart failure) (HCC)    Chronic back pain    Chronic bronchitis ([place])    Cirrhosis ([place])    Complication of anesthesia    had an asthma attack when woke up from procedure   Complication of anesthesia    asthma attack with one surgery   COPD (chronic obstructive pulmonary disease) ([place])    Degenerative joint disease    Depression    Diabetes mellitus without complication ([place])    DJD (degenerative joint disease)    Fatty liver    Fibromyalgia    GERD (gastroesophageal reflux disease)    Glaucoma    Headache(784.0)    Heart failure, diastolic, chronic (HCC)    Heart murmur    PMH:As a child only   Hernia of abdominal cavity    "upper and lower hernia"   History of hiatal hernia    HTN (hypertension)    Hypertension    Hypothyroidism    IBS (irritable bowel syndrome)    Morbid obesity ([place])    Neuropathy    associated with diabetes   Obstructive sleep apnea    Pancreatitis    Pinched nerve in neck    Pneumonia    as a child   PONV (postoperative nausea and vomiting)    Pulmonary hypertension (HCC)    Rheumatic fever    PMH: as a child   Seizures ([place])    PMH: only as a child   Seizures ([place])    in childhood   Shortness of breath    Sleep apnea    Spinal stenosis    Stroke ([place])    12/09/2013   Past Surgical History:  Procedure Laterality Date   ABDOMINAL HYSTERECTOMY     ABDOMINAL HYSTERECTOMY     partial hysterectomy    APPENDECTOMY     BACK SURGERY     BACK SURGERY     disc surgery with complications and damage to right side   BILATERAL KNEE ARTHROSCOPY  2000, 2009   BIOPSY  06/16/2021   Procedure: BIOPSY;  Surgeon: Harvel Quale, MD;  [facility]: [facility];  Service: Gastroenterology;;   BREAST CYST EXCISION Left    CARDIAC CATHETERIZATION     2009   CATARACT EXTRACTION W/ INTRAOCULAR LENS  IMPLANT, BILATERAL     CHOLECYSTECTOMY  11/22/1992   CHOLECYSTECTOMY     COLONOSCOPY N/A 08/01/2013   Procedure: COLONOSCOPY;  Surgeon: Rogene Houston, MD;  Location: [facility];  Service: Endoscopy;  Laterality: N/A;  930   COLONOSCOPY N/A 08/03/2017   Procedure: COLONOSCOPY;  Surgeon: Rogene Houston, MD;  [facility]: [facility];  Service: Endoscopy;  Laterality: N/A;  12:00   CYST EXCISION     Left breast   ESOPHAGOGASTRODUODENOSCOPY (EGD) WITH PROPOFOL N/A 06/16/2021   Procedure: ESOPHAGOGASTRODUODENOSCOPY (EGD) WITH PROPOFOL;  Surgeon: Harvel Quale, MD;  [facility]: [facility];  Service: Gastroenterology;  Laterality: N/A;  12:50   EXCISIONAL HEMORRHOIDECTOMY     EYE SURGERY  11/22/1965   EYE SURGERY     bilateral cataract removal with lens implants   EYE SURGERY     growth removed from right eye lid   EYE SURGERY     bilateral eye surgery age 49 t"to straighten eyes'   heel (other)  11/23/2003   HEEL SPUR SURGERY     Right heel - growth removal and rebuilding of heel   HEMORRHOID SURGERY     HERNIA REPAIR     KNEE ARTHROSCOPY Bilateral    LUMBAR LAMINECTOMY/DECOMPRESSION MICRODISCECTOMY Right 08/23/2014   Procedure: LUMBAR LAMINECTOMY/DECOMPRESSION MICRODISCECTOMY 1 LEVEL;  Surgeon: Charlie Pitter, MD;  Location: [facility];  Service: Neurosurgery;  Laterality: Right;  LUMBAR LAMINECTOMY/DECOMPRESSION MICRODISCECTOMY 1 LEVEL LUMBAR 2-3   ovaries removed  11/22/2001   PARTIAL HYSTERECTOMY  11/23/1979   SPHINCTEROTOMY     spinahatomy  11/22/1990   TOTAL KNEE  ARTHROPLASTY Right 11/05/2020   Procedure: RIGHT TOTAL KNEE ARTHROPLASTY;  Surgeon: Marybelle Killings, MD;  Location: [facility];  Service: Orthopedics;  Laterality: Right;   TUBAL LIGATION  11/22/1973   TUBAL LIGATION     Social History   Socioeconomic History   Marital status: Married    Spouse name: Not on file   Number of children: Not on file   Years of education: Not on file   Highest education level: Not on file  Occupational History   Not on file  Tobacco Use   Smoking status: Never    Passive exposure: Never   Smokeless tobacco: Never  Vaping Use   Vaping Use: Never used  Substance and Sexual Activity   Alcohol use: No   Drug use: No   Sexual activity: Not on file  Other Topics Concern   Not on file  Social History Narrative   ** Merged History Encounter **       Regularly exercises. Full Time.    Social Determinants of Health   Financial Resource Strain: Not on file  Food Insecurity: Not on file  Transportation Needs: Not on file  Physical Activity: Not on file  Stress: Not on file  Social Connections: Not on file   Family History  Problem Relation Age of Onset   Cancer Other    Heart failure Other    Colon cancer Brother    Allergies  Allergen Reactions   Cinnamon Anaphylaxis   Hydrocodone Other (See Comments)    Over sedation   Naproxen Anaphylaxis and Other (See Comments)    Throat swelling   Other Shortness Of Breath and Other (See Comments)    Local anesthesia Had asthma attack   Other Shortness Of Breath and Other (See Comments)    Pt states a sedative agent used prior to a surgical procedure ( Pt states it "sounds like Phenergan. It should be in my records from [place].") caused an asthma attack.    Oxycodone Other (See Comments)    Over sedation   Feldene [Piroxicam] Nausea Only   Lyrica [Pregabalin] Other (See Comments)    Altered mental status for prolonged period   Nsaids     Avoid due to history of gastic bypass   Phenergan  [Promethazine Hcl] Other (See Comments)    triggers asthma attacks   Sulfa Antibiotics Other (See Comments)    (Yeast infection, per patient.)   Latex Rash and Other (See Comments)    "Blistery" rash.  Tape Rash and Other (See Comments)    Do not use adhesive tape; okay to use paper tape.   Prior to Admission medications   Medication Sig Start Date End Date Taking? Authorizing Provider  atorvastatin (LIPITOR) 10 MG tablet Take 10 mg by mouth daily. 08/15/20  Yes [provider]  BREO ELLIPTA 200-25 MCG/INH AEPB Inhale 2 puffs into the lungs daily. 08/22/20  Yes [provider]  Carboxymethylcellulose Sodium (THERATEARS) 0.25 % SOLN Place 1 drop into both eyes in the morning, at noon, and at bedtime.   Yes [provider]  cyclobenzaprine (FLEXERIL) 5 MG tablet Take 5 mg by mouth every 8 (eight) hours as needed for muscle spasms. 05/06/22  Yes [provider]  famotidine (PEPCID) 40 MG tablet Take 40 mg by mouth at bedtime.   Yes [provider]  furosemide (LASIX) 20 MG tablet Take 20 mg by mouth 2 (two) times daily. 10/23/20  Yes [provider]  HYDROcodone-acetaminophen (NORCO/VICODIN) 5-325 MG tablet Take 0.5-1 tablets by mouth 3 (three) times daily as needed. 05/06/22  Yes [provider]  levothyroxine (SYNTHROID) 112 MCG tablet Take 112 mcg by mouth daily before breakfast. 09/22/20  Yes [provider]  Multiple Vitamin (MULTIVITAMIN PO) Take 2 tablets by mouth daily. ADEK vitamin chewable   Yes [provider]  olopatadine (PATANOL) 0.1 % ophthalmic solution Place 1 drop into both eyes 2 (two) times daily.   Yes [provider]  ondansetron (ZOFRAN) 4 MG tablet Take 1 tablet (4 mg total) by mouth every 8 (eight) hours as needed for nausea or vomiting. 05/13/22  Yes Carlan, Chelsea L, NP  pantoprazole (PROTONIX) 40 MG tablet Take 40 mg by mouth daily. 10/23/20  Yes [provider]   spironolactone (ALDACTONE) 25 MG tablet Take 25 mg by mouth daily. 11/01/19  Yes [provider]  albuterol (VENTOLIN HFA) 108 (90 Base) MCG/ACT inhaler Inhale 2 puffs into the lungs 4 (four) times daily as needed for shortness of breath. 09/22/20   [provider]  Calcium Carb-Cholecalciferol (CALCIUM 500+D PO) Take 1 tablet by mouth in the morning and at bedtime.    [provider]  Cholecalciferol (VITAMIN D) 50 MCG (2000 UT) tablet Take 2,000 Units by mouth daily.    [provider]  Cyanocobalamin (VITAMIN B-12) 5000 MCG SUBL Place 5,000 mcg under the tongue every other day.    [provider]   No results found.  Positive ROS: All other systems have been reviewed and were otherwise negative with the exception of those mentioned in the HPI and as above.  Physical Exam: General: Alert, no acute distress Cardiovascular: No pedal edema Respiratory: No cyanosis, no use of accessory musculature GI: No organomegaly, abdomen is soft and non-tender Skin: No lesions in the area of chief complaint Neurologic: Sensation intact distally Psychiatric: Patient is competent for consent with normal mood and affect Lymphatic: No axillary or cervical lymphadenopathy  MUSCULOSKELETAL: Left upper extremity warm well perfused no open wounds or lesions.  Neurovascular intact.  Assessment: 1.  Left shoulder rotator cuff tear, acute. 2.  Left shoulder subacromial impingement 3.  Left shoulder degenerative labral tearing 4.  Left shoulder acromioclavicular joint arthritis  Plan: Plan is to proceed today with arthroscopic assisted rotator cuff repair with distal clavicle resection as well as subacromial decompression and debridement.  We again reviewed the risk and benefits of the procedure.  We discussed the risk of bleeding, infection, damage to surrounding nerves and vessels,  stiffness, failure repairs, need for future surgery, risk of anesthesia, as well as  postoperative recovery risk.  She has provided informed consent.  -Plan for discharge home postoperatively from PACU.    Nicholes Stairs, MD Cell [phone]    08/06/2022 12:44 PM

## 2022-08-06 NOTE — Anesthesia Procedure Notes (Signed)
Anesthesia Regional Block: Interscalene brachial plexus block   Pre-Anesthetic Checklist: , timeout performed,  Correct Patient, Correct Site, Correct Laterality,  Correct Procedure, Correct Position, site marked,  Risks and benefits discussed,  Surgical consent,  Pre-op evaluation,  At surgeon's request and post-op pain management  Laterality: Left  Prep: chloraprep       Needles:  Injection technique: Single-shot  Needle Type: Echogenic Stimulator Needle     Needle Length: 10cm  Needle Gauge: 20     Additional Needles:   Procedures:,,,, ultrasound used (permanent image in chart),,    Narrative:  Start time: 08/06/2022 11:59 AM End time: 08/06/2022 12:03 PM Injection made incrementally with aspirations every 5 mL.  Performed by: Personally  Anesthesiologist: Lidia Collum, MD  Additional Notes: Standard monitors applied. Skin prepped. Good needle visualization with ultrasound. Injection made in 5cc increments with no resistance to injection. Patient tolerated the procedure well.

## 2022-08-06 NOTE — Brief Op Note (Signed)
08/06/2022  2:44 PM  PATIENT:  Kimberly [name]  66 y.o. female  PRE-OPERATIVE DIAGNOSIS:  Left shoulder rotator cuff tear  POST-OPERATIVE DIAGNOSIS:  Left shoulder rotator cuff tear  PROCEDURE:  Procedure(s): SHOULDER ARTHROSCOPY WITH ROTATOR CUFF REPAIR (Left) SHOULDER ARTHROSCOPY WITH DISTAL CLAVICLE RESECTION (Left) SHOULDER ARTHROSCOPY WITH SUBACROMIAL DECOMPRESSIONWITH EXTENSIVE DEBRIDEMENT (Left)  SURGEON:  Surgeon(s) and Role:    * Nicholes Stairs, MD - Primary  PHYSICIAN ASSISTANT: Jonelle Sidle, PA-C  ANESTHESIA:   regional and general  EBL:  10 mL   BLOOD ADMINISTERED:none  DRAINS: none   LOCAL MEDICATIONS USED:  NONE  SPECIMEN:  No Specimen  DISPOSITION OF SPECIMEN:  N/A  COUNTS:  YES  TOURNIQUET:  * No tourniquets in log *  DICTATION: .Note written in EPIC  PLAN OF CARE: Discharge to home after PACU  PATIENT DISPOSITION:  PACU - hemodynamically stable.   Delay start of Pharmacological VTE agent (>24hrs) due to surgical blood loss or risk of bleeding: not applicable

## 2022-08-06 NOTE — Anesthesia Procedure Notes (Signed)
Procedure Name: Intubation Date/Time: 08/06/2022 1:20 PM  Performed by: Maude Leriche, CRNAPre-anesthesia Checklist: Patient identified, Emergency Drugs available, Suction available and Patient being monitored Patient Re-evaluated:Patient Re-evaluated prior to induction Oxygen Delivery Method: Circle system utilized Preoxygenation: Pre-oxygenation with 100% oxygen Induction Type: IV induction Ventilation: Mask ventilation without difficulty Laryngoscope Size: Miller and 2 Grade View: Grade I Tube type: Oral Tube size: 7.0 mm Number of attempts: 1 Airway Equipment and Method: Stylet and Bite block Placement Confirmation: ETT inserted through vocal cords under direct vision, positive ETCO2 and breath sounds checked- equal and bilateral Secured at: 21 cm Tube secured with: Tape Dental Injury: Teeth and Oropharynx as per pre-operative assessment

## 2022-08-06 NOTE — Transfer of Care (Signed)
Immediate Anesthesia Transfer of Care Note  Patient: Kimberly [name]  Procedure(s) Performed: SHOULDER ARTHROSCOPY WITH ROTATOR CUFF REPAIR (Left: Shoulder) SHOULDER ARTHROSCOPY WITH DISTAL CLAVICLE RESECTION (Left: Shoulder) SHOULDER ARTHROSCOPY WITH SUBACROMIAL DECOMPRESSIONWITH EXTENSIVE DEBRIDEMENT (Left: Shoulder)  Patient Location: PACU  Anesthesia Type:GA combined with regional for post-op pain  Level of Consciousness: awake, alert  and oriented  Airway & Oxygen Therapy: Patient Spontanous Breathing and Patient connected to nasal cannula oxygen  Post-op Assessment: Report given to RN, Post -op Vital signs reviewed and stable and Patient able to stick tongue midline  Post vital signs: Reviewed  Last Vitals:  Vitals Value Taken Time  BP 134/72 08/06/22 1434  Temp 97.6 08/06/22  1440  Pulse 73 08/06/22 1437  Resp 17 08/06/22 1437  SpO2 99 % 08/06/22 1437  Vitals shown include unvalidated device data.  Last Pain:  Vitals:   08/06/22 1028  TempSrc: Oral         Complications: No notable events documented.

## 2022-08-06 NOTE — Anesthesia Postprocedure Evaluation (Signed)
Anesthesia Post Note  Patient: MARDI CANNADY  Procedure(s) Performed: SHOULDER ARTHROSCOPY WITH ROTATOR CUFF REPAIR (Left: Shoulder) SHOULDER ARTHROSCOPY WITH DISTAL CLAVICLE RESECTION (Left: Shoulder) SHOULDER ARTHROSCOPY WITH SUBACROMIAL DECOMPRESSIONWITH EXTENSIVE DEBRIDEMENT (Left: Shoulder)     Patient location during evaluation: PACU Anesthesia Type: General Level of consciousness: awake and alert Pain management: pain level controlled Vital Signs Assessment: post-procedure vital signs reviewed and stable Respiratory status: spontaneous breathing, nonlabored ventilation and respiratory function stable Cardiovascular status: blood pressure returned to baseline and stable Postop Assessment: no apparent nausea or vomiting Anesthetic complications: no   No notable events documented.  Last Vitals:  Vitals:   08/06/22 1515 08/06/22 1530  BP: 130/66 128/70  Pulse: 67 65  Resp: (!) 21 18  Temp:    SpO2: 99% 98%    Last Pain:  Vitals:   08/06/22 1530  TempSrc:   PainSc: 0-No pain                 Lidia Collum

## 2022-08-06 NOTE — Op Note (Signed)
Date of Surgery: 08/06/2022  INDICATIONS: [name] is a 66 y.o.-year-old female with a left rotator cuff tear as well as impingement and AC joint arthritis;  The patient did consent to the procedure after discussion of the risks and benefits.  DIAGNOSES: Left shoulder, acute on chronic rotator cuff tear, biceps tendinitis, AC arthritis, subacromial impingement, and degenerative labral tearing anterior and posterior .  POST-OPERATIVE DIAGNOSIS: same  PROCEDURE: Arthroscopic extensive debridement - Kimberly [name], Anterior Labrum, Superior Labrum, Posterior Labrum, and rotator interval Arthroscopic distal clavicle excision - 37169 Arthroscopic subacromial decompression - 67893 Arthroscopic rotator cuff repair - 81017   OPERATIVE FINDING: Exam under anesthesia: Normal Articular space:  Degenerative labral tearing noted anterior, superior and posterior.  No arthritis Chondral surfaces: Normal Biceps:  Biceps tendinitis and inflammation noted proximal Subscapularis: Intact  Supraspinatus: Complete tear small crescent tear noted in the midportion of the supraspinatus Infraspinatus: Intact  Intact teres minor   SURGEON: Kimberly [name], M.D.  ASSIST: Jonelle Sidle, PA-C  Assistant attestation:  PA Kimberly [name] was present for the entire procedure.  He participated in all portions..  ANESTHESIA:  general, interscalene  IV FLUIDS AND URINE: See anesthesia.  ESTIMATED BLOOD LOSS: 10 mL.  IMPLANTS:  2.6 mm RC fiber tack x1 Arthrex 4.75 mm swivel lock anchor bio composite x1 Arthrex  DRAINS: None  COMPLICATIONS: None.  DESCRIPTION OF PROCEDURE: The patient was brought to the operating room and placed supine on the operating table.  The patient had been signed prior to the procedure and this was documented. The patient had the anesthesia placed by the anesthesiologist.  A time-out was performed to confirm that this was the correct patient, site, side and location. The patient  did receive antibiotics prior to the incision and was re-dosed during the procedure as needed at indicated intervals.  A tourniquet was not placed.  The patient had the operative extremity prepped and draped in the standard surgical fashion.      After obtaining informed consent the patient was brought to the operating table and underwent satisfactory anesthesia. An exam under anesthesia revealed full range of motion. He was placed in the right lateral decubitus position with an axillary roll and all bony prominences properly padded. A standard surgical timeout was performed.  The arm e was placed in 10 pounds of gentle in-line suspension.  Standard posterior and anterior superior portals were established. A diagnostic evaluation of the glenohumeral joint was performed. The biceps tendon was markedly synovitic and partially torn. It was tenotomized. An extensive debridement was performed of the superior anterior and posterior labrum. The articular surfaces revealed no significant chondromalacia . The subscapularis was intact. The rotator cuff was torn and the greater tuberosity footprint was prepared for repair. Releases were performed on the capsular surface of the rotator cuff.  We did note there to be an intact subscapularis as well as infraspinatus and teres minor.  No loose bodies.  Crescent tear of the supraspinatus 1 cm x 1 cm.  The arthroscope was inserted in the subacromial space and an additional lateral portal was established. An acromioplasty performed nicely decompressing the subacromial space with a motorized burr.   Bursitis in the subacromial space was removed as well as releases were performed on the bursal surface of the rotator cuff.  We then identified our crescent-shaped supraspinatus tear.  This was a 1 cm x 1 cm tear.  We prepared the greater tuberosity for our rotator cuff repair by first decorticating the tuberosity.  We  then placed a 2.6 mm Arthrex fiber tack anchor at the  articular margin.  We passed to the sutures separately x4.  We then pulled these over into a single lateral row anchor.  We used a 4.75 mm swivel lock anchor to fixate laterally for a double row equivalent transosseous repair.  This moved as a single unit when complete.  Lastly, we moved for the distal clavicle resection.  We moved to viewing from the lateral portal while working from the anterior portal and used the radiofrequency wand to remove periosteum from distal clavicle.  We then introduced our motorized bur into the anterior portal.  We performed a 1 cm resection of the distal clavicle.    The patient tolerated the procedure well with no noted complications.  The arthroscope was then removed and portals closed with 4-0 Monocryl in standard fashion followed by a sterile occlusive dressing Polar Care ice sleeve and a slingshot sling. The patient was sent to recovery in stable condition and tolerated the procedure well  POSTOPERATIVE PLAN:  Sling was placed on the operative extremity.  She will be nonweightbearing for approximately 4 weeks.  The sling can be donned and doffed for showering and getting dressed but otherwise should be on at all times.  We will see her back in the office in 2 weeks.  Plan to discharge home today from PACU.

## 2022-08-06 NOTE — Discharge Instructions (Signed)
Orthopedic surgery discharge instructions:  -Maintain postoperative bandages for 3 days.  You may remove these bandages on post op day 3 and begin showering at that time.  Please do not submerge underwater.  -Maintain your arm in sling at all times.  You should only remove for showering and getting dressed.  No lifting with the operative arm.  -For mild to moderate pain use Tylenol and Advil in alternating fashion around-the-clock.  For breakthrough pain use oxycodone as necessary.  -Please apply ice to the right shoulder for 20-30 minutes out of each hour that you are awake.  Do this around-the-clock for the first 3 days from surgery.  -Follow-up in 2 weeks for routine postoperative check.

## 2022-08-09 ENCOUNTER — Encounter (HOSPITAL_COMMUNITY): Payer: Self-pay | Admitting: Orthopedic Surgery

## 2022-08-21 DIAGNOSIS — M810 Age-related osteoporosis without current pathological fracture: Secondary | ICD-10-CM | POA: Diagnosis not present

## 2022-08-21 DIAGNOSIS — I10 Essential (primary) hypertension: Secondary | ICD-10-CM | POA: Diagnosis not present

## 2022-08-23 DIAGNOSIS — J449 Chronic obstructive pulmonary disease, unspecified: Secondary | ICD-10-CM | POA: Diagnosis not present

## 2022-08-26 DIAGNOSIS — M25612 Stiffness of left shoulder, not elsewhere classified: Secondary | ICD-10-CM | POA: Diagnosis not present

## 2022-08-26 DIAGNOSIS — R531 Weakness: Secondary | ICD-10-CM | POA: Diagnosis not present

## 2022-08-26 DIAGNOSIS — M25512 Pain in left shoulder: Secondary | ICD-10-CM | POA: Diagnosis not present

## 2022-08-26 DIAGNOSIS — M75122 Complete rotator cuff tear or rupture of left shoulder, not specified as traumatic: Secondary | ICD-10-CM | POA: Diagnosis not present

## 2022-08-27 DIAGNOSIS — M75122 Complete rotator cuff tear or rupture of left shoulder, not specified as traumatic: Secondary | ICD-10-CM | POA: Diagnosis not present

## 2022-08-27 DIAGNOSIS — M25512 Pain in left shoulder: Secondary | ICD-10-CM | POA: Diagnosis not present

## 2022-08-27 DIAGNOSIS — R531 Weakness: Secondary | ICD-10-CM | POA: Diagnosis not present

## 2022-08-27 DIAGNOSIS — M25612 Stiffness of left shoulder, not elsewhere classified: Secondary | ICD-10-CM | POA: Diagnosis not present

## 2022-09-01 DIAGNOSIS — M25612 Stiffness of left shoulder, not elsewhere classified: Secondary | ICD-10-CM | POA: Diagnosis not present

## 2022-09-01 DIAGNOSIS — M75122 Complete rotator cuff tear or rupture of left shoulder, not specified as traumatic: Secondary | ICD-10-CM | POA: Diagnosis not present

## 2022-09-01 DIAGNOSIS — M25512 Pain in left shoulder: Secondary | ICD-10-CM | POA: Diagnosis not present

## 2022-09-01 DIAGNOSIS — R531 Weakness: Secondary | ICD-10-CM | POA: Diagnosis not present

## 2022-09-02 DIAGNOSIS — M75122 Complete rotator cuff tear or rupture of left shoulder, not specified as traumatic: Secondary | ICD-10-CM | POA: Diagnosis not present

## 2022-09-02 DIAGNOSIS — R531 Weakness: Secondary | ICD-10-CM | POA: Diagnosis not present

## 2022-09-02 DIAGNOSIS — M25612 Stiffness of left shoulder, not elsewhere classified: Secondary | ICD-10-CM | POA: Diagnosis not present

## 2022-09-02 DIAGNOSIS — M25512 Pain in left shoulder: Secondary | ICD-10-CM | POA: Diagnosis not present

## 2022-09-06 DIAGNOSIS — M25612 Stiffness of left shoulder, not elsewhere classified: Secondary | ICD-10-CM | POA: Diagnosis not present

## 2022-09-06 DIAGNOSIS — M75122 Complete rotator cuff tear or rupture of left shoulder, not specified as traumatic: Secondary | ICD-10-CM | POA: Diagnosis not present

## 2022-09-06 DIAGNOSIS — M25512 Pain in left shoulder: Secondary | ICD-10-CM | POA: Diagnosis not present

## 2022-09-06 DIAGNOSIS — R531 Weakness: Secondary | ICD-10-CM | POA: Diagnosis not present

## 2022-09-09 DIAGNOSIS — M25512 Pain in left shoulder: Secondary | ICD-10-CM | POA: Diagnosis not present

## 2022-09-09 DIAGNOSIS — M75122 Complete rotator cuff tear or rupture of left shoulder, not specified as traumatic: Secondary | ICD-10-CM | POA: Diagnosis not present

## 2022-09-09 DIAGNOSIS — R531 Weakness: Secondary | ICD-10-CM | POA: Diagnosis not present

## 2022-09-09 DIAGNOSIS — M25612 Stiffness of left shoulder, not elsewhere classified: Secondary | ICD-10-CM | POA: Diagnosis not present

## 2022-09-10 ENCOUNTER — Other Ambulatory Visit (INDEPENDENT_AMBULATORY_CARE_PROVIDER_SITE_OTHER): Payer: Self-pay | Admitting: Gastroenterology

## 2022-09-13 DIAGNOSIS — M25612 Stiffness of left shoulder, not elsewhere classified: Secondary | ICD-10-CM | POA: Diagnosis not present

## 2022-09-13 DIAGNOSIS — M75122 Complete rotator cuff tear or rupture of left shoulder, not specified as traumatic: Secondary | ICD-10-CM | POA: Diagnosis not present

## 2022-09-13 DIAGNOSIS — R531 Weakness: Secondary | ICD-10-CM | POA: Diagnosis not present

## 2022-09-13 DIAGNOSIS — M25512 Pain in left shoulder: Secondary | ICD-10-CM | POA: Diagnosis not present

## 2022-09-13 NOTE — Telephone Encounter (Signed)
Last seen 05/13/22

## 2022-09-17 DIAGNOSIS — M75122 Complete rotator cuff tear or rupture of left shoulder, not specified as traumatic: Secondary | ICD-10-CM | POA: Diagnosis not present

## 2022-09-17 DIAGNOSIS — M25612 Stiffness of left shoulder, not elsewhere classified: Secondary | ICD-10-CM | POA: Diagnosis not present

## 2022-09-17 DIAGNOSIS — M25512 Pain in left shoulder: Secondary | ICD-10-CM | POA: Diagnosis not present

## 2022-09-17 DIAGNOSIS — R531 Weakness: Secondary | ICD-10-CM | POA: Diagnosis not present

## 2022-09-20 DIAGNOSIS — R531 Weakness: Secondary | ICD-10-CM | POA: Diagnosis not present

## 2022-09-20 DIAGNOSIS — M25612 Stiffness of left shoulder, not elsewhere classified: Secondary | ICD-10-CM | POA: Diagnosis not present

## 2022-09-20 DIAGNOSIS — M75122 Complete rotator cuff tear or rupture of left shoulder, not specified as traumatic: Secondary | ICD-10-CM | POA: Diagnosis not present

## 2022-09-20 DIAGNOSIS — M25512 Pain in left shoulder: Secondary | ICD-10-CM | POA: Diagnosis not present

## 2022-09-21 DIAGNOSIS — H35371 Puckering of macula, right eye: Secondary | ICD-10-CM | POA: Diagnosis not present

## 2022-09-21 DIAGNOSIS — H353231 Exudative age-related macular degeneration, bilateral, with active choroidal neovascularization: Secondary | ICD-10-CM | POA: Diagnosis not present

## 2022-09-21 DIAGNOSIS — E113293 Type 2 diabetes mellitus with mild nonproliferative diabetic retinopathy without macular edema, bilateral: Secondary | ICD-10-CM | POA: Diagnosis not present

## 2022-09-21 DIAGNOSIS — H43823 Vitreomacular adhesion, bilateral: Secondary | ICD-10-CM | POA: Diagnosis not present

## 2022-09-23 DIAGNOSIS — J449 Chronic obstructive pulmonary disease, unspecified: Secondary | ICD-10-CM | POA: Diagnosis not present

## 2022-09-27 DIAGNOSIS — M25512 Pain in left shoulder: Secondary | ICD-10-CM | POA: Diagnosis not present

## 2022-09-27 DIAGNOSIS — R531 Weakness: Secondary | ICD-10-CM | POA: Diagnosis not present

## 2022-09-27 DIAGNOSIS — M75122 Complete rotator cuff tear or rupture of left shoulder, not specified as traumatic: Secondary | ICD-10-CM | POA: Diagnosis not present

## 2022-09-27 DIAGNOSIS — M25612 Stiffness of left shoulder, not elsewhere classified: Secondary | ICD-10-CM | POA: Diagnosis not present

## 2022-10-01 DIAGNOSIS — M75122 Complete rotator cuff tear or rupture of left shoulder, not specified as traumatic: Secondary | ICD-10-CM | POA: Diagnosis not present

## 2022-10-01 DIAGNOSIS — M25612 Stiffness of left shoulder, not elsewhere classified: Secondary | ICD-10-CM | POA: Diagnosis not present

## 2022-10-01 DIAGNOSIS — R531 Weakness: Secondary | ICD-10-CM | POA: Diagnosis not present

## 2022-10-01 DIAGNOSIS — M25512 Pain in left shoulder: Secondary | ICD-10-CM | POA: Diagnosis not present

## 2022-10-02 ENCOUNTER — Encounter (INDEPENDENT_AMBULATORY_CARE_PROVIDER_SITE_OTHER): Payer: Self-pay | Admitting: Gastroenterology

## 2022-10-07 DIAGNOSIS — R531 Weakness: Secondary | ICD-10-CM | POA: Diagnosis not present

## 2022-10-07 DIAGNOSIS — M75122 Complete rotator cuff tear or rupture of left shoulder, not specified as traumatic: Secondary | ICD-10-CM | POA: Diagnosis not present

## 2022-10-07 DIAGNOSIS — M25612 Stiffness of left shoulder, not elsewhere classified: Secondary | ICD-10-CM | POA: Diagnosis not present

## 2022-10-07 DIAGNOSIS — M25512 Pain in left shoulder: Secondary | ICD-10-CM | POA: Diagnosis not present

## 2022-10-08 DIAGNOSIS — R531 Weakness: Secondary | ICD-10-CM | POA: Diagnosis not present

## 2022-10-08 DIAGNOSIS — M25512 Pain in left shoulder: Secondary | ICD-10-CM | POA: Diagnosis not present

## 2022-10-08 DIAGNOSIS — M75122 Complete rotator cuff tear or rupture of left shoulder, not specified as traumatic: Secondary | ICD-10-CM | POA: Diagnosis not present

## 2022-10-08 DIAGNOSIS — M25612 Stiffness of left shoulder, not elsewhere classified: Secondary | ICD-10-CM | POA: Diagnosis not present

## 2022-10-11 DIAGNOSIS — M75122 Complete rotator cuff tear or rupture of left shoulder, not specified as traumatic: Secondary | ICD-10-CM | POA: Diagnosis not present

## 2022-10-11 DIAGNOSIS — R531 Weakness: Secondary | ICD-10-CM | POA: Diagnosis not present

## 2022-10-11 DIAGNOSIS — M25612 Stiffness of left shoulder, not elsewhere classified: Secondary | ICD-10-CM | POA: Diagnosis not present

## 2022-10-11 DIAGNOSIS — M25512 Pain in left shoulder: Secondary | ICD-10-CM | POA: Diagnosis not present

## 2022-10-13 DIAGNOSIS — M25512 Pain in left shoulder: Secondary | ICD-10-CM | POA: Diagnosis not present

## 2022-10-13 DIAGNOSIS — R531 Weakness: Secondary | ICD-10-CM | POA: Diagnosis not present

## 2022-10-13 DIAGNOSIS — M25612 Stiffness of left shoulder, not elsewhere classified: Secondary | ICD-10-CM | POA: Diagnosis not present

## 2022-10-13 DIAGNOSIS — M75122 Complete rotator cuff tear or rupture of left shoulder, not specified as traumatic: Secondary | ICD-10-CM | POA: Diagnosis not present

## 2022-10-18 DIAGNOSIS — J069 Acute upper respiratory infection, unspecified: Secondary | ICD-10-CM | POA: Diagnosis not present

## 2022-10-18 DIAGNOSIS — R079 Chest pain, unspecified: Secondary | ICD-10-CM | POA: Diagnosis not present

## 2022-10-18 DIAGNOSIS — R0981 Nasal congestion: Secondary | ICD-10-CM | POA: Diagnosis not present

## 2022-10-18 DIAGNOSIS — Z299 Encounter for prophylactic measures, unspecified: Secondary | ICD-10-CM | POA: Diagnosis not present

## 2022-10-23 DIAGNOSIS — J449 Chronic obstructive pulmonary disease, unspecified: Secondary | ICD-10-CM | POA: Diagnosis not present

## 2022-10-25 DIAGNOSIS — R531 Weakness: Secondary | ICD-10-CM | POA: Diagnosis not present

## 2022-10-25 DIAGNOSIS — M25612 Stiffness of left shoulder, not elsewhere classified: Secondary | ICD-10-CM | POA: Diagnosis not present

## 2022-10-25 DIAGNOSIS — M75122 Complete rotator cuff tear or rupture of left shoulder, not specified as traumatic: Secondary | ICD-10-CM | POA: Diagnosis not present

## 2022-10-25 DIAGNOSIS — M25512 Pain in left shoulder: Secondary | ICD-10-CM | POA: Diagnosis not present

## 2022-10-28 DIAGNOSIS — G5602 Carpal tunnel syndrome, left upper limb: Secondary | ICD-10-CM | POA: Diagnosis not present

## 2022-10-29 DIAGNOSIS — M25612 Stiffness of left shoulder, not elsewhere classified: Secondary | ICD-10-CM | POA: Diagnosis not present

## 2022-10-29 DIAGNOSIS — M25512 Pain in left shoulder: Secondary | ICD-10-CM | POA: Diagnosis not present

## 2022-10-29 DIAGNOSIS — R531 Weakness: Secondary | ICD-10-CM | POA: Diagnosis not present

## 2022-10-29 DIAGNOSIS — M75122 Complete rotator cuff tear or rupture of left shoulder, not specified as traumatic: Secondary | ICD-10-CM | POA: Diagnosis not present

## 2022-11-09 DIAGNOSIS — M25612 Stiffness of left shoulder, not elsewhere classified: Secondary | ICD-10-CM | POA: Diagnosis not present

## 2022-11-09 DIAGNOSIS — R531 Weakness: Secondary | ICD-10-CM | POA: Diagnosis not present

## 2022-11-09 DIAGNOSIS — M25512 Pain in left shoulder: Secondary | ICD-10-CM | POA: Diagnosis not present

## 2022-11-09 DIAGNOSIS — M75122 Complete rotator cuff tear or rupture of left shoulder, not specified as traumatic: Secondary | ICD-10-CM | POA: Diagnosis not present

## 2022-11-10 ENCOUNTER — Other Ambulatory Visit (INDEPENDENT_AMBULATORY_CARE_PROVIDER_SITE_OTHER): Payer: Self-pay | Admitting: Gastroenterology

## 2022-11-11 ENCOUNTER — Ambulatory Visit (INDEPENDENT_AMBULATORY_CARE_PROVIDER_SITE_OTHER): Payer: Medicare Other | Admitting: Gastroenterology

## 2022-11-11 DIAGNOSIS — R531 Weakness: Secondary | ICD-10-CM | POA: Diagnosis not present

## 2022-11-11 DIAGNOSIS — M25612 Stiffness of left shoulder, not elsewhere classified: Secondary | ICD-10-CM | POA: Diagnosis not present

## 2022-11-11 DIAGNOSIS — M25512 Pain in left shoulder: Secondary | ICD-10-CM | POA: Diagnosis not present

## 2022-11-11 DIAGNOSIS — M75122 Complete rotator cuff tear or rupture of left shoulder, not specified as traumatic: Secondary | ICD-10-CM | POA: Diagnosis not present

## 2022-11-17 DIAGNOSIS — M25612 Stiffness of left shoulder, not elsewhere classified: Secondary | ICD-10-CM | POA: Diagnosis not present

## 2022-11-17 DIAGNOSIS — M25512 Pain in left shoulder: Secondary | ICD-10-CM | POA: Diagnosis not present

## 2022-11-17 DIAGNOSIS — M75122 Complete rotator cuff tear or rupture of left shoulder, not specified as traumatic: Secondary | ICD-10-CM | POA: Diagnosis not present

## 2022-11-17 DIAGNOSIS — R531 Weakness: Secondary | ICD-10-CM | POA: Diagnosis not present

## 2022-11-18 DIAGNOSIS — M25512 Pain in left shoulder: Secondary | ICD-10-CM | POA: Diagnosis not present

## 2022-11-18 DIAGNOSIS — R531 Weakness: Secondary | ICD-10-CM | POA: Diagnosis not present

## 2022-11-18 DIAGNOSIS — M25612 Stiffness of left shoulder, not elsewhere classified: Secondary | ICD-10-CM | POA: Diagnosis not present

## 2022-11-18 DIAGNOSIS — M75122 Complete rotator cuff tear or rupture of left shoulder, not specified as traumatic: Secondary | ICD-10-CM | POA: Diagnosis not present

## 2022-11-23 DIAGNOSIS — J449 Chronic obstructive pulmonary disease, unspecified: Secondary | ICD-10-CM | POA: Diagnosis not present

## 2022-11-26 DIAGNOSIS — R531 Weakness: Secondary | ICD-10-CM | POA: Diagnosis not present

## 2022-11-26 DIAGNOSIS — M25612 Stiffness of left shoulder, not elsewhere classified: Secondary | ICD-10-CM | POA: Diagnosis not present

## 2022-11-26 DIAGNOSIS — M75122 Complete rotator cuff tear or rupture of left shoulder, not specified as traumatic: Secondary | ICD-10-CM | POA: Diagnosis not present

## 2022-11-26 DIAGNOSIS — M25512 Pain in left shoulder: Secondary | ICD-10-CM | POA: Diagnosis not present

## 2022-12-01 DIAGNOSIS — M25612 Stiffness of left shoulder, not elsewhere classified: Secondary | ICD-10-CM | POA: Diagnosis not present

## 2022-12-01 DIAGNOSIS — M75122 Complete rotator cuff tear or rupture of left shoulder, not specified as traumatic: Secondary | ICD-10-CM | POA: Diagnosis not present

## 2022-12-01 DIAGNOSIS — R531 Weakness: Secondary | ICD-10-CM | POA: Diagnosis not present

## 2022-12-01 DIAGNOSIS — M25512 Pain in left shoulder: Secondary | ICD-10-CM | POA: Diagnosis not present

## 2022-12-03 DIAGNOSIS — M25612 Stiffness of left shoulder, not elsewhere classified: Secondary | ICD-10-CM | POA: Diagnosis not present

## 2022-12-03 DIAGNOSIS — R531 Weakness: Secondary | ICD-10-CM | POA: Diagnosis not present

## 2022-12-03 DIAGNOSIS — M75122 Complete rotator cuff tear or rupture of left shoulder, not specified as traumatic: Secondary | ICD-10-CM | POA: Diagnosis not present

## 2022-12-03 DIAGNOSIS — M25512 Pain in left shoulder: Secondary | ICD-10-CM | POA: Diagnosis not present

## 2022-12-06 ENCOUNTER — Encounter (INDEPENDENT_AMBULATORY_CARE_PROVIDER_SITE_OTHER): Payer: Self-pay | Admitting: *Deleted

## 2022-12-08 DIAGNOSIS — M75122 Complete rotator cuff tear or rupture of left shoulder, not specified as traumatic: Secondary | ICD-10-CM | POA: Diagnosis not present

## 2022-12-08 DIAGNOSIS — R531 Weakness: Secondary | ICD-10-CM | POA: Diagnosis not present

## 2022-12-08 DIAGNOSIS — M25512 Pain in left shoulder: Secondary | ICD-10-CM | POA: Diagnosis not present

## 2022-12-08 DIAGNOSIS — M25612 Stiffness of left shoulder, not elsewhere classified: Secondary | ICD-10-CM | POA: Diagnosis not present

## 2022-12-09 DIAGNOSIS — Z9889 Other specified postprocedural states: Secondary | ICD-10-CM | POA: Diagnosis not present

## 2022-12-09 DIAGNOSIS — G5602 Carpal tunnel syndrome, left upper limb: Secondary | ICD-10-CM | POA: Diagnosis not present

## 2022-12-14 DIAGNOSIS — E113293 Type 2 diabetes mellitus with mild nonproliferative diabetic retinopathy without macular edema, bilateral: Secondary | ICD-10-CM | POA: Diagnosis not present

## 2022-12-14 DIAGNOSIS — H353231 Exudative age-related macular degeneration, bilateral, with active choroidal neovascularization: Secondary | ICD-10-CM | POA: Diagnosis not present

## 2022-12-14 DIAGNOSIS — H5319 Other subjective visual disturbances: Secondary | ICD-10-CM | POA: Diagnosis not present

## 2022-12-14 DIAGNOSIS — H43823 Vitreomacular adhesion, bilateral: Secondary | ICD-10-CM | POA: Diagnosis not present

## 2022-12-14 DIAGNOSIS — H35033 Hypertensive retinopathy, bilateral: Secondary | ICD-10-CM | POA: Diagnosis not present

## 2022-12-14 DIAGNOSIS — H35371 Puckering of macula, right eye: Secondary | ICD-10-CM | POA: Diagnosis not present

## 2022-12-15 DIAGNOSIS — M25512 Pain in left shoulder: Secondary | ICD-10-CM | POA: Diagnosis not present

## 2022-12-15 DIAGNOSIS — M75122 Complete rotator cuff tear or rupture of left shoulder, not specified as traumatic: Secondary | ICD-10-CM | POA: Diagnosis not present

## 2022-12-15 DIAGNOSIS — M25612 Stiffness of left shoulder, not elsewhere classified: Secondary | ICD-10-CM | POA: Diagnosis not present

## 2022-12-15 DIAGNOSIS — R531 Weakness: Secondary | ICD-10-CM | POA: Diagnosis not present

## 2022-12-20 DIAGNOSIS — M25512 Pain in left shoulder: Secondary | ICD-10-CM | POA: Diagnosis not present

## 2022-12-20 DIAGNOSIS — M75122 Complete rotator cuff tear or rupture of left shoulder, not specified as traumatic: Secondary | ICD-10-CM | POA: Diagnosis not present

## 2022-12-20 DIAGNOSIS — R531 Weakness: Secondary | ICD-10-CM | POA: Diagnosis not present

## 2022-12-20 DIAGNOSIS — M25612 Stiffness of left shoulder, not elsewhere classified: Secondary | ICD-10-CM | POA: Diagnosis not present

## 2022-12-22 DIAGNOSIS — M75122 Complete rotator cuff tear or rupture of left shoulder, not specified as traumatic: Secondary | ICD-10-CM | POA: Diagnosis not present

## 2022-12-22 DIAGNOSIS — M25612 Stiffness of left shoulder, not elsewhere classified: Secondary | ICD-10-CM | POA: Diagnosis not present

## 2022-12-22 DIAGNOSIS — M25512 Pain in left shoulder: Secondary | ICD-10-CM | POA: Diagnosis not present

## 2022-12-22 DIAGNOSIS — R531 Weakness: Secondary | ICD-10-CM | POA: Diagnosis not present

## 2022-12-24 DIAGNOSIS — J449 Chronic obstructive pulmonary disease, unspecified: Secondary | ICD-10-CM | POA: Diagnosis not present

## 2022-12-27 DIAGNOSIS — H35371 Puckering of macula, right eye: Secondary | ICD-10-CM | POA: Diagnosis not present

## 2022-12-27 DIAGNOSIS — H5319 Other subjective visual disturbances: Secondary | ICD-10-CM | POA: Diagnosis not present

## 2022-12-27 DIAGNOSIS — H43823 Vitreomacular adhesion, bilateral: Secondary | ICD-10-CM | POA: Diagnosis not present

## 2022-12-27 DIAGNOSIS — H353231 Exudative age-related macular degeneration, bilateral, with active choroidal neovascularization: Secondary | ICD-10-CM | POA: Diagnosis not present

## 2022-12-27 DIAGNOSIS — E113293 Type 2 diabetes mellitus with mild nonproliferative diabetic retinopathy without macular edema, bilateral: Secondary | ICD-10-CM | POA: Diagnosis not present

## 2022-12-27 DIAGNOSIS — H35033 Hypertensive retinopathy, bilateral: Secondary | ICD-10-CM | POA: Diagnosis not present

## 2022-12-29 DIAGNOSIS — H5319 Other subjective visual disturbances: Secondary | ICD-10-CM | POA: Diagnosis not present

## 2022-12-29 DIAGNOSIS — H43393 Other vitreous opacities, bilateral: Secondary | ICD-10-CM | POA: Diagnosis not present

## 2022-12-30 DIAGNOSIS — H353232 Exudative age-related macular degeneration, bilateral, with inactive choroidal neovascularization: Secondary | ICD-10-CM | POA: Diagnosis not present

## 2022-12-31 DIAGNOSIS — M25512 Pain in left shoulder: Secondary | ICD-10-CM | POA: Diagnosis not present

## 2022-12-31 DIAGNOSIS — R531 Weakness: Secondary | ICD-10-CM | POA: Diagnosis not present

## 2022-12-31 DIAGNOSIS — M25612 Stiffness of left shoulder, not elsewhere classified: Secondary | ICD-10-CM | POA: Diagnosis not present

## 2022-12-31 DIAGNOSIS — M75122 Complete rotator cuff tear or rupture of left shoulder, not specified as traumatic: Secondary | ICD-10-CM | POA: Diagnosis not present

## 2023-01-03 DIAGNOSIS — M25612 Stiffness of left shoulder, not elsewhere classified: Secondary | ICD-10-CM | POA: Diagnosis not present

## 2023-01-03 DIAGNOSIS — M75122 Complete rotator cuff tear or rupture of left shoulder, not specified as traumatic: Secondary | ICD-10-CM | POA: Diagnosis not present

## 2023-01-03 DIAGNOSIS — R531 Weakness: Secondary | ICD-10-CM | POA: Diagnosis not present

## 2023-01-03 DIAGNOSIS — M25512 Pain in left shoulder: Secondary | ICD-10-CM | POA: Diagnosis not present

## 2023-01-05 DIAGNOSIS — M25512 Pain in left shoulder: Secondary | ICD-10-CM | POA: Diagnosis not present

## 2023-01-05 DIAGNOSIS — M75122 Complete rotator cuff tear or rupture of left shoulder, not specified as traumatic: Secondary | ICD-10-CM | POA: Diagnosis not present

## 2023-01-05 DIAGNOSIS — M25612 Stiffness of left shoulder, not elsewhere classified: Secondary | ICD-10-CM | POA: Diagnosis not present

## 2023-01-05 DIAGNOSIS — R531 Weakness: Secondary | ICD-10-CM | POA: Diagnosis not present

## 2023-01-10 ENCOUNTER — Other Ambulatory Visit (INDEPENDENT_AMBULATORY_CARE_PROVIDER_SITE_OTHER): Payer: Self-pay | Admitting: Gastroenterology

## 2023-01-10 NOTE — Telephone Encounter (Signed)
Last seen 05/13/22 and next appt is 01/17/23

## 2023-01-17 ENCOUNTER — Ambulatory Visit (INDEPENDENT_AMBULATORY_CARE_PROVIDER_SITE_OTHER): Payer: 59 | Admitting: Gastroenterology

## 2023-01-17 ENCOUNTER — Encounter (INDEPENDENT_AMBULATORY_CARE_PROVIDER_SITE_OTHER): Payer: Self-pay | Admitting: *Deleted

## 2023-01-17 ENCOUNTER — Encounter (INDEPENDENT_AMBULATORY_CARE_PROVIDER_SITE_OTHER): Payer: Self-pay | Admitting: Gastroenterology

## 2023-01-17 VITALS — BP 121/81 | HR 67 | Temp 97.8°F | Ht 60.0 in | Wt 134.9 lb

## 2023-01-17 DIAGNOSIS — R197 Diarrhea, unspecified: Secondary | ICD-10-CM | POA: Diagnosis not present

## 2023-01-17 DIAGNOSIS — K219 Gastro-esophageal reflux disease without esophagitis: Secondary | ICD-10-CM | POA: Diagnosis not present

## 2023-01-17 DIAGNOSIS — K649 Unspecified hemorrhoids: Secondary | ICD-10-CM | POA: Diagnosis not present

## 2023-01-17 DIAGNOSIS — K7581 Nonalcoholic steatohepatitis (NASH): Secondary | ICD-10-CM | POA: Diagnosis not present

## 2023-01-17 DIAGNOSIS — K7460 Unspecified cirrhosis of liver: Secondary | ICD-10-CM

## 2023-01-17 MED ORDER — HYDROCORTISONE (PERIANAL) 2.5 % EX CREA: 1.0000 | TOPICAL_CREAM | Freq: Two times a day (BID) | CUTANEOUS | 1 refills | Status: DC

## 2023-01-17 MED ORDER — BOOST MAX 30G PROTEIN PO LIQD: 1.0000 | Freq: Two times a day (BID) | ORAL | 6 refills | Status: DC

## 2023-01-17 MED ORDER — DICYCLOMINE HCL 10 MG PO CAPS: 10.0000 mg | ORAL_CAPSULE | Freq: Three times a day (TID) | ORAL | 2 refills | Status: DC | PRN

## 2023-01-17 NOTE — Patient Instructions (Addendum)
-  We will update liver labs and imaging -Will get you scheduled for EGD and colonoscopy in July -Continue daily probiotic, I am providing some samples of one you can try as well -We will check celiac panel, if this is negative, we may consider the bile acid medication as discussed -I have sent anusol cream to apply to hemorrhoids up to twice a day as needed, can do soaks in warm tub to help as well -I have sent Rx for protein shakes twice daily, please let me know if you have issues getting these -you can try stopping famotidine/pepcid and continuing on protonix 68m once daily, if you are having more GERD symptoms, please let me know - Reduce salt intake to <2 g per day - Can take Tylenol max of 2 g per day (650 mg q8h) for pain - Avoid NSAIDs for pain - Avoid eating raw oysters/shellfish   Follow up 6 months  It was a pleasure to see you today. I want to create trusting relationships with patients and provide genuine, compassionate, and quality care. I truly value your feedback! please be on the lookout for a survey regarding your visit with me today. I appreciate your input about our visit and your time in completing this!    Jazzmin Newbold L. CAlver Sorrow MSN, APRN, AGNP-C Adult-Gerontology Nurse Practitioner [facility]

## 2023-01-17 NOTE — Addendum Note (Signed)
Addended by: Harvel Quale on: 01/17/2023 09:42 PM   Modules accepted: Level of Service

## 2023-01-17 NOTE — Progress Notes (Addendum)
Referring Provider: Glenda Chroman, MD Primary Care Physician:  Glenda Chroman, MD Primary GI Physician: Jenetta Downer   Chief Complaint  Patient presents with   Cirrhosis    Follow up on cirrhosis. Has more than 10 stools per day. Has urgency with stools. Tried imodium and it makes stomach cramp. Started itching about one month ago and having fatigue.    HPI:   Kimberly [name] is a 67 y.o. female with past medical history of compensated NASH cirrhosis, CHF, coronary artery disease, depression, diabetes, COPD, IBS-D, hypothyroidism, hypertension, OSA, stroke, seizures, obesity status post duodenal switch, macular degeneration  Patient presenting today for follow up of NASH cirrhosis and IBS-D.  Last seen June 2023, at that time recently treated for suspected diverticulitis by PCP.  having severe lower abdominal cramping, diarrhea and lower back pain. felt very tired and weak.  She reports that after the antibiotic, she felt some better but then diarrhea returned. She is having fecal urgency/incontinence. Usually she will have abdominal cramping and then will have a BM, sometimes cannot even control her bowels. Stools occur often after eating, though sometimes even when she doesn't eat she will have diarrhea.  She is unsure how many episodes of diarrhea she has per day but has had 3 so far today. When she saw PCP in mid may, she was having some blood in her stools. She does endorse some "roaring" noises in her abdomen. She has a lot nausea but this is a chronic thing for her. She has an occasional formed stool, maybe 1/4 BMs. Reports diarrhea has been ongoing since duodenal switch in 2020. Very occasional heartburn or acid reflux. Denies unintentional weight loss. She states appetite is not good, she gets hungry but then does not want anything when she tries to eat. previous SIBO breath testing was not completed.  Recommend proceeding with CT A/P with contrast, colonoscopy in September, RUQ [place], MELD  labs, AFP due in August  AFP WNL, INR 1, LFTs with AST 54, ALT 41, T BIli 1.5, plt 127k in August 2023 RUQ [place] August 2023 without obvious lesions  CT A/P with no acute findings. Findings compatible with portal venous hypertension (prominent portal and splenic veins in the upper abdomen, mild splenomegaly) and presumably an associated liver cirrhosis. Trace free fluid in the RIGHT lower pelvis.  No other free fluid.   Present:  She continues to have stools ranging from looser to soft. Having 8-10 stools per day. Denies rectal bleeding. She has some irritation and itching from her hemorrhoids is not using anything for this as she feels the cream makes symptoms worse. She has tried aquaphor with some relief. She has some abdominal cramping when she eats. Symptoms are worse if she eats more starchy foods. She also notes some pain radiating to the sides of her abdomen and into her back. She has taken some imodium but this does not seem to help. She is not taking levsin anymore.  She has very occasional nausea. She is taking a daily probiotic, has noticed less watery since starting this.   She saw Dr. Toney Rakes who suggested revision of her bariatric surgery given her diarrhea as he felt this could be secondary to her surgery.   She has had some itching for the past few months, notes that it began with the top of her head itching and feeling very warm. She changed shampoo thinking this was why. Then began having burning and itching of her face, has transitioned to her  arms and legs as well. She notes a red rash to her cheeks. Saw rheumatology in the past and was just told she had a lot of arthritis. She notes feeling more fatigued recently as well.   GERD well controlled on protonix once daily and famotidine. She is wondering if she can stop famotidine and try just PPI   Previous MELD Na: 9  Cirrhosis related questions: Episodes of confusion/disorientation: occasional forgetfulness for a few weeks   Taking diuretics? Spironolactone '25mg'$  daily lasix '40mg'$  daily  Beta blockers?  no Prior history of variceal banding? no Prior episodes of SBP? No  Last liver imaging: RUQ [place] in August was without lesions     Last Colonoscopy:08/03/17- Diverticulosis at the hepatic flexure. - Internal hemorrhoids. - No specimens collected. Last Endoscopy:06/16/21- Small hiatal hernia. - Normal stomach. - Widely patent duodenal switch, characterized by healthy appearing mucosa was found. (Peptic duodenitis, no other alterations)   Repeat EGD due July 2024 TCS due sept 2023   Past Medical History:  Diagnosis Date   Anemia    PMH: as a child   Arthritis    Asthma    CAD (coronary artery disease)    nonobstructive by cath, 6/08 (false positive Cardiolite) normal stress echo, 7/11   CHF (congestive heart failure) (HCC)    Chronic back pain    Chronic bronchitis (HCC)    Cirrhosis ([place])    Complication of anesthesia    had an asthma attack when woke up from procedure   Complication of anesthesia    asthma attack with one surgery   COPD (chronic obstructive pulmonary disease) ([place])    Degenerative joint disease    Depression    Diabetes mellitus without complication ([place])    DJD (degenerative joint disease)    Fatty liver    Fibromyalgia    GERD (gastroesophageal reflux disease)    Glaucoma    Headache(784.0)    Heart failure, diastolic, chronic (HCC)    Heart murmur    PMH:As a child only   Hernia of abdominal cavity    "upper and lower hernia"   History of hiatal hernia    HTN (hypertension)    Hypertension    Hypothyroidism    IBS (irritable bowel syndrome)    Morbid obesity ([place])    Neuropathy    associated with diabetes   Obstructive sleep apnea    Pancreatitis    Pinched nerve in neck    Pneumonia    as a child   PONV (postoperative nausea and vomiting)    Pulmonary hypertension (HCC)    Rheumatic fever    PMH: as a child   Seizures ([place])    PMH: only as a child    Seizures ([place])    in childhood   Shortness of breath    Sleep apnea    Spinal stenosis    Stroke ([place])    12/09/2013    Past Surgical History:  Procedure Laterality Date   ABDOMINAL HYSTERECTOMY     ABDOMINAL HYSTERECTOMY     partial hysterectomy   APPENDECTOMY     BACK SURGERY     BACK SURGERY     disc surgery with complications and damage to right side   BARIATRIC SURGERY     DS in June 2020   BILATERAL KNEE ARTHROSCOPY  2000, 2009   BIOPSY  06/16/2021   Procedure: BIOPSY;  Surgeon: Harvel Quale, MD;  [facility]: [facility];  Service: Gastroenterology;;   BREAST CYST  EXCISION Left    CARDIAC CATHETERIZATION     2009   CATARACT EXTRACTION W/ INTRAOCULAR LENS  IMPLANT, BILATERAL     CHOLECYSTECTOMY  11/22/1992   CHOLECYSTECTOMY     COLONOSCOPY N/A 08/01/2013   Procedure: COLONOSCOPY;  Surgeon: Rogene Houston, MD;  [facility];  Service: Endoscopy;  Laterality: N/A;  930   COLONOSCOPY N/A 08/03/2017   Procedure: COLONOSCOPY;  Surgeon: Rogene Houston, MD;  [facility];  Service: Endoscopy;  Laterality: N/A;  12:00   CYST EXCISION     Left breast   ESOPHAGOGASTRODUODENOSCOPY (EGD) WITH PROPOFOL N/A 06/16/2021   Procedure: ESOPHAGOGASTRODUODENOSCOPY (EGD) WITH PROPOFOL;  Surgeon: Harvel Quale, MD;  [facility];  Service: Gastroenterology;  Laterality: N/A;  12:50   EXCISIONAL HEMORRHOIDECTOMY     EYE SURGERY  11/22/1965   EYE SURGERY     bilateral cataract removal with lens implants   EYE SURGERY     growth removed from right eye lid   EYE SURGERY     bilateral eye surgery age 80 t"to straighten eyes'   heel (other)  11/23/2003   HEEL SPUR SURGERY     Right heel - growth removal and rebuilding of heel   HEMORRHOID SURGERY     HERNIA REPAIR     KNEE ARTHROSCOPY Bilateral    LUMBAR LAMINECTOMY/DECOMPRESSION MICRODISCECTOMY Right 08/23/2014   Procedure: LUMBAR LAMINECTOMY/DECOMPRESSION MICRODISCECTOMY 1  LEVEL;  Surgeon: Charlie Pitter, MD;  Location: [facility];  Service: Neurosurgery;  Laterality: Right;  LUMBAR LAMINECTOMY/DECOMPRESSION MICRODISCECTOMY 1 LEVEL LUMBAR 2-3   ovaries removed  11/22/2001   PARTIAL HYSTERECTOMY  11/23/1979   SHOULDER ARTHROSCOPY WITH DISTAL CLAVICLE RESECTION Left 08/06/2022   Procedure: SHOULDER ARTHROSCOPY WITH DISTAL CLAVICLE RESECTION;  Surgeon: Nicholes Stairs, MD;  Location: [facility];  Service: Orthopedics;  Laterality: Left;   SHOULDER ARTHROSCOPY WITH ROTATOR CUFF REPAIR Left 08/06/2022   Procedure: SHOULDER ARTHROSCOPY WITH ROTATOR CUFF REPAIR;  Surgeon: Nicholes Stairs, MD;  Location: [facility];  Service: Orthopedics;  Laterality: Left;   SHOULDER ARTHROSCOPY WITH SUBACROMIAL DECOMPRESSION Left 08/06/2022   Procedure: SHOULDER ARTHROSCOPY WITH SUBACROMIAL [place] EXTENSIVE DEBRIDEMENT;  Surgeon: Nicholes Stairs, MD;  Location: [facility];  Service: Orthopedics;  Laterality: Left;   SPHINCTEROTOMY     spinahatomy  11/22/1990   TOTAL KNEE ARTHROPLASTY Right 11/05/2020   Procedure: RIGHT TOTAL KNEE ARTHROPLASTY;  Surgeon: Marybelle Killings, MD;  Location: [facility];  Service: Orthopedics;  Laterality: Right;   TUBAL LIGATION  11/22/1973   TUBAL LIGATION      Current Outpatient Medications  Medication Sig Dispense Refill   albuterol (VENTOLIN HFA) 108 (90 Base) MCG/ACT inhaler Inhale 2 puffs into the lungs 4 (four) times daily as needed for shortness of breath.     atorvastatin (LIPITOR) 10 MG tablet Take 10 mg by mouth daily.     BREO ELLIPTA 200-25 MCG/INH AEPB Inhale 2 puffs into the lungs daily.     Carboxymethylcellulose Sodium (THERATEARS) 0.25 % SOLN Place 1 drop into both eyes in the morning, at noon, and at bedtime.     cyclobenzaprine (FLEXERIL) 5 MG tablet Take 5 mg by mouth every 8 (eight) hours as needed for muscle spasms.     diclofenac (VOLTAREN) 0.1 % ophthalmic solution Place 1 drop into both eyes in the morning, at noon, and at  bedtime.     famotidine (PEPCID) 40 MG tablet Take 40 mg by mouth at bedtime.  furosemide (LASIX) 20 MG tablet Take 20 mg by mouth 2 (two) times daily.     levothyroxine (SYNTHROID) 112 MCG tablet Take 112 mcg by mouth daily before breakfast.     Multiple Vitamin (MULTIVITAMIN PO) Take 2 tablets by mouth daily. ADEK vitamin chewable     olopatadine (PATANOL) 0.1 % ophthalmic solution Place 1 drop into both eyes 2 (two) times daily.     ondansetron (ZOFRAN) 4 MG tablet TAKE 1 TABLET BY MOUTH EVERY 8 HOURS AS NEEDED FOR NAUSEA OR VOMITING 30 tablet 1   oxyCODONE-acetaminophen (PERCOCET) 7.5-325 MG tablet Take 1 tablet by mouth every 4 (four) hours as needed for severe pain. 20 tablet 0   pantoprazole (PROTONIX) 40 MG tablet Take 40 mg by mouth daily.     PRESCRIPTION MEDICATION Injection in both eyes every 10 weeks for macular deg     spironolactone (ALDACTONE) 25 MG tablet Take 25 mg by mouth daily.     Calcium Carb-Cholecalciferol (CALCIUM 500+D PO) Take 1 tablet by mouth in the morning and at bedtime. (Patient not taking: Reported on 01/17/2023)     Cholecalciferol (VITAMIN D) 50 MCG (2000 UT) tablet Take 2,000 Units by mouth daily. (Patient not taking: Reported on 01/17/2023)     Cyanocobalamin (VITAMIN B-12) 5000 MCG SUBL Place 5,000 mcg under the tongue every other day. (Patient not taking: Reported on 01/17/2023)     No current facility-administered medications for this visit.    Allergies as of 01/17/2023 - Review Complete 01/17/2023  Allergen Reaction Noted   Cinnamon Anaphylaxis 09/29/2016   Hydrocodone Other (See Comments) 09/06/2014   Naproxen Anaphylaxis and Other (See Comments)    Other Shortness Of Breath and Other (See Comments) 08/20/2011   Oxycodone Other (See Comments) 09/06/2014   Feldene [piroxicam] Nausea Only 09/28/2016   Lyrica [pregabalin] Other (See Comments) 09/28/2016   Nsaids  10/22/2020   Phenergan [promethazine hcl] Other (See Comments) 08/04/2016   Sulfa  antibiotics Other (See Comments) 04/06/2017   Latex Rash and Other (See Comments) 04/06/2017   Tape Rash and Other (See Comments) 08/14/2014    Family History  Problem Relation Age of Onset   Cancer Other    Heart failure Other    Colon cancer Brother     Social History   Socioeconomic History   Marital status: Married    Spouse name: Not on file   Number of children: Not on file   Years of education: Not on file   Highest education level: Not on file  Occupational History   Not on file  Tobacco Use   Smoking status: Never    Passive exposure: Never   Smokeless tobacco: Never  Vaping Use   Vaping Use: Never used  Substance and Sexual Activity   Alcohol use: No   Drug use: No   Sexual activity: Not on file  Other Topics Concern   Not on file  Social History Narrative   ** Merged History Encounter **       Regularly exercises. Full Time.    Social Determinants of Health   Financial Resource Strain: Not on file  Food Insecurity: Not on file  Transportation Needs: Not on file  Physical Activity: Not on file  Stress: Not on file  Social Connections: Not on file   Review of systems General: negative for malaise, night sweats, fever, chills, weight loss Neck: Negative for lumps, goiter, pain and significant neck swelling Resp: Negative for cough, wheezing, dyspnea at rest CV: Negative for  chest pain, leg swelling, palpitations, orthopnea GI: denies melena, hematochezia, nausea, vomiting, constipation, dysphagia, odyonophagia, early satiety or unintentional weight loss. +diarrhea +abd cramping  MSK: Negative for joint pain or swelling, back pain, and muscle pain. Derm: +pruritus  Psych: Denies depression, anxiety, memory loss, confusion. No homicidal or suicidal ideation.  Heme: Negative for prolonged bleeding, bruising easily, and swollen nodes. Endocrine: Negative for cold or heat intolerance, polyuria, polydipsia and goiter. Neuro: negative for tremor, gait  imbalance, syncope and seizures. The remainder of the review of systems is noncontributory.  Physical Exam: BP 121/81 (BP Location: Right Arm, Patient Position: Sitting, Cuff Size: Normal)   Pulse 67   Temp 97.8 F (36.6 C) (Oral)   Ht 5' (1.524 m)   Wt 134 lb 14.4 oz (61.2 kg)   BMI 26.35 kg/m  General:   Alert and oriented. No distress noted. Pleasant and cooperative.  Head:  Normocephalic and atraumatic. Eyes:  Conjuctiva clear without scleral icterus. Mouth:  Oral mucosa pink and moist. Good dentition. No lesions. Heart: Normal rate and rhythm, s1 and s2 heart sounds present.  Lungs: Clear lung sounds in all lobes. Respirations equal and unlabored. Abdomen:  +BS, soft, non-tender and non-distended. No rebound or guarding. No HSM or masses noted. Derm: No palmar erythema or jaundice Msk:  Symmetrical without gross deformities. Normal posture. Extremities:  Without edema. Neurologic:  Alert and  oriented x4. No asterixis  Psych:  Alert and cooperative. Normal mood and affect.  Invalid input(s): "6 MONTHS"   ASSESSMENT: MAGEAN UFFELMAN is a 67 y.o. female presenting today for follow up  NASH cirrhosis: has remained relatively well compensated. She is due for esophageal varice screening in July 2024 as well as due for MELD labs, INR, AFP and liver imaging now. We will get her scheduled for these things and update labs.   Diarrhea/suspected IBS-D: continues to have very loose stools, up to 8-10 a day with some abdominal cramping. Some improvement with probiotic, recommended to have SIBO testing in the past but due to transportation issues she has not been able to do this. Suspect some aspect of loose stools is related to previous duodenal switch, however, will check celiac panel to rule this out as she does have a family history of this. Can continue with daily probiotic, I will also provide some samples. Will start dicyclomine '10mg'$  TID PRN for abdominal cramping. Ideally needs to be  on high protein diet, avoid starchy foods and high carb content. As she is on a fixed income, she has trouble maintaining high protein diet. I will attempt to send Rx for boost protein shakes BID to see if insurance will cover this for her. May consider bile acid sequestrant for diarrhea if celiac negative and probiotic is not improving her symptoms.   GERD:well managed on protonix '40mg'$  daily and famotidine '40mg'$  QHS, she has rare breakthrough, she would like to trial off of H2B which I think is reasonable. If she begins to have more breakthrough symptoms, will consider change in PPI therapy as she has been maintained on protonix for many years.  Hemorrhoids: notes some itching and burning on occasion with frequent BMs. Will send anusol cream BID PRN, advised to clean up with warm wet wash cloth, sitz baths PRN.  She was due for screening colonoscopy in September 2023, will schedule this at time of upcoming EGD for variceal screening.   Indications, risks and benefits of procedure discussed in detail with patient. Patient verbalized understanding and is in  agreement to proceed with EGD/Colonoscopy.   PLAN:  -EGD/Colonoscopy July 2024-[facility] -RUQ [place]  - MELD labs, INR, AFP  -Continue with daily probiotic, samples provided  - Celiac panel - Trial cholestyramine 4g if celiac panel negative  - Continue protonix and stop famotidine, pt to let me know if having breakthrough symptoms -Rx dicyclomine '10mg'$  TID PRN for abd pain - Rx boost protein shakes BID -Anusol hemorrhoid cream BID PRN, sitz baths - Reduce salt intake to <2 g per day - Can take Tylenol max of 2 g per day (650 mg q8h) for pain - Avoid NSAIDs for pain - Avoid eating raw oysters/shellfish - Ensure every night before going to sleep   All questions were answered, patient verbalized understanding and is in agreement with plan as outlined above.    Follow Up: 6 months   Marybeth Dandy L. Alver Sorrow, MSN, APRN,  AGNP-C Adult-Gerontology Nurse Practitioner [facility]  I have reviewed the note and agree with the APP's assessment as described in this progress note  Patient with persistent diarrhea. Part of this could be related to IBS-D, part due to duodenal switch. Would discourage surgical intervention unless strictly necessary given inherent risk of hepatic decompensation.  Maylon Peppers, MD Gastroenterology and Hepatology [facility] Gastroenterology

## 2023-01-18 DIAGNOSIS — H5319 Other subjective visual disturbances: Secondary | ICD-10-CM | POA: Diagnosis not present

## 2023-01-18 DIAGNOSIS — H35371 Puckering of macula, right eye: Secondary | ICD-10-CM | POA: Diagnosis not present

## 2023-01-18 DIAGNOSIS — H35033 Hypertensive retinopathy, bilateral: Secondary | ICD-10-CM | POA: Diagnosis not present

## 2023-01-18 DIAGNOSIS — E113293 Type 2 diabetes mellitus with mild nonproliferative diabetic retinopathy without macular edema, bilateral: Secondary | ICD-10-CM | POA: Diagnosis not present

## 2023-01-18 DIAGNOSIS — H353231 Exudative age-related macular degeneration, bilateral, with active choroidal neovascularization: Secondary | ICD-10-CM | POA: Diagnosis not present

## 2023-01-18 DIAGNOSIS — H43823 Vitreomacular adhesion, bilateral: Secondary | ICD-10-CM | POA: Diagnosis not present

## 2023-01-19 LAB — COMPREHENSIVE METABOLIC PANEL
AG Ratio: 1.6 (calc) (ref 1.0–2.5)
ALT: 22 U/L (ref 6–29)
AST: 39 U/L — ABNORMAL HIGH (ref 10–35)
Albumin: 4 g/dL (ref 3.6–5.1)
Alkaline phosphatase (APISO): 124 U/L (ref 37–153)
BUN/Creatinine Ratio: 17 (calc) (ref 6–22)
BUN: 20 mg/dL (ref 7–25)
CO2: 26 mmol/L (ref 20–32)
Calcium: 9.5 mg/dL (ref 8.6–10.4)
Chloride: 107 mmol/L (ref 98–110)
Creat: 1.2 mg/dL — ABNORMAL HIGH (ref 0.50–1.05)
Globulin: 2.5 g/dL (calc) (ref 1.9–3.7)
Glucose, Bld: 89 mg/dL (ref 65–99)
Potassium: 4.1 mmol/L (ref 3.5–5.3)
Sodium: 142 mmol/L (ref 135–146)
Total Bilirubin: 1.9 mg/dL — ABNORMAL HIGH (ref 0.2–1.2)
Total Protein: 6.5 g/dL (ref 6.1–8.1)

## 2023-01-19 LAB — CELIAC DISEASE PANEL
(tTG) Ab, IgA: <1 U/mL
(tTG) Ab, IgG: <1 U/mL
Gliadin IgA: 1.1 U/mL
Gliadin IgG: <1 U/mL
Immunoglobulin A: 247 mg/dL (ref 70–320)

## 2023-01-19 LAB — CBC
HCT: 37 % (ref 35.0–45.0)
Hemoglobin: 12.8 g/dL (ref 11.7–15.5)
MCH: 34.1 pg — ABNORMAL HIGH (ref 27.0–33.0)
MCHC: 34.6 g/dL (ref 32.0–36.0)
MCV: 98.7 fL (ref 80.0–100.0)
MPV: 11.3 fL (ref 7.5–12.5)
Platelets: 167 10*3/uL (ref 140–400)
RBC: 3.75 10*6/uL — ABNORMAL LOW (ref 3.80–5.10)
RDW: 12.3 % (ref 11.0–15.0)
WBC: 3.6 10*3/uL — ABNORMAL LOW (ref 3.8–10.8)

## 2023-01-19 LAB — PROTIME-INR
INR: 1
Prothrombin Time: 10.8 s (ref 9.0–11.5)

## 2023-01-19 LAB — AFP TUMOR MARKER: AFP-Tumor Marker: 3 ng/mL

## 2023-01-22 DIAGNOSIS — J449 Chronic obstructive pulmonary disease, unspecified: Secondary | ICD-10-CM | POA: Diagnosis not present

## 2023-01-26 ENCOUNTER — Ambulatory Visit (HOSPITAL_COMMUNITY)
Admission: RE | Admit: 2023-01-26 | Discharge: 2023-01-26 | Disposition: A | Discharge status: Home / Self Care | Payer: 59 | Source: Ambulatory Visit | Attending: Gastroenterology | Admitting: Gastroenterology

## 2023-01-26 DIAGNOSIS — K7581 Nonalcoholic steatohepatitis (NASH): Secondary | ICD-10-CM | POA: Diagnosis not present

## 2023-01-26 DIAGNOSIS — K7460 Unspecified cirrhosis of liver: Secondary | ICD-10-CM | POA: Insufficient documentation

## 2023-02-01 ENCOUNTER — Other Ambulatory Visit (INDEPENDENT_AMBULATORY_CARE_PROVIDER_SITE_OTHER): Payer: Self-pay | Admitting: Gastroenterology

## 2023-02-01 ENCOUNTER — Telehealth (INDEPENDENT_AMBULATORY_CARE_PROVIDER_SITE_OTHER): Payer: Self-pay | Admitting: *Deleted

## 2023-02-01 DIAGNOSIS — K839 Disease of biliary tract, unspecified: Secondary | ICD-10-CM

## 2023-02-01 DIAGNOSIS — R17 Unspecified jaundice: Secondary | ICD-10-CM

## 2023-02-01 DIAGNOSIS — R1011 Right upper quadrant pain: Secondary | ICD-10-CM

## 2023-02-01 NOTE — Telephone Encounter (Signed)
Per result note: Gabriel Rung, NP  Carmelina Noun, LPN; Cheron Every, CMA; Vicente Males, LPN I would recommend we proceed with MRCP to evaluate the bile ducts further as she is having symptoms and her bilirubin was elevated. Mindy/Cherry Wittwer, can we schedule MRCP w wo for dilated common bile duct, elevated bilirubin and RUQ pain   Per [id number] or Prior Authorization is not required for the requested services You are not required to submit a notification/prior authorization based on the information provided. The number above acknowledges your inquiry and our response. Please write this number down and refer to it for future inquiries. If you still wish to submit your request for review, please select the Continue with Submission button below. Decision ID #: [id number]  Pt scheduled for 02/24/23 at 9 am at [facility]. Pt needs to arrive at 8:45. NPO 4 hour prior. Pt contacted and made aware. Will also send appt letter to patient via my chart.

## 2023-02-01 NOTE — Telephone Encounter (Signed)
Patient was following up for appt for EGD and TCS. I copied Chelsea's note from her office visit on 2/26 below:  ( also see result note - she needs MRCP also)   She was due for screening colonoscopy in September 2023, will schedule this at time of upcoming EGD for variceal screening.    Indications, risks and benefits of procedure discussed in detail with patient. Patient verbalized understanding and is in agreement to proceed with EGD/Colonoscopy.

## 2023-02-01 NOTE — Telephone Encounter (Signed)
Per Vikki Ports last OV note "Will get you scheduled for EGD and colonoscopy in July"

## 2023-02-10 ENCOUNTER — Emergency Department (HOSPITAL_COMMUNITY): Payer: 59

## 2023-02-10 ENCOUNTER — Inpatient Hospital Stay (HOSPITAL_COMMUNITY)
Admission: EM | Admit: 2023-02-10 | Discharge: 2023-02-15 | DRG: 563 | Disposition: A | Discharge status: Skilled Nursing Facility | Payer: 59 | Attending: Internal Medicine | Admitting: Internal Medicine

## 2023-02-10 DIAGNOSIS — D649 Anemia, unspecified: Secondary | ICD-10-CM | POA: Diagnosis not present

## 2023-02-10 DIAGNOSIS — E119 Type 2 diabetes mellitus without complications: Secondary | ICD-10-CM | POA: Diagnosis not present

## 2023-02-10 DIAGNOSIS — S42211A Unspecified displaced fracture of surgical neck of right humerus, initial encounter for closed fracture: Principal | ICD-10-CM | POA: Diagnosis present

## 2023-02-10 DIAGNOSIS — I2510 Atherosclerotic heart disease of native coronary artery without angina pectoris: Secondary | ICD-10-CM | POA: Diagnosis not present

## 2023-02-10 DIAGNOSIS — I272 Pulmonary hypertension, unspecified: Secondary | ICD-10-CM | POA: Diagnosis present

## 2023-02-10 DIAGNOSIS — Z23 Encounter for immunization: Secondary | ICD-10-CM | POA: Diagnosis not present

## 2023-02-10 DIAGNOSIS — R200 Anesthesia of skin: Secondary | ICD-10-CM | POA: Diagnosis present

## 2023-02-10 DIAGNOSIS — M25462 Effusion, left knee: Secondary | ICD-10-CM | POA: Diagnosis not present

## 2023-02-10 DIAGNOSIS — M25531 Pain in right wrist: Secondary | ICD-10-CM | POA: Diagnosis not present

## 2023-02-10 DIAGNOSIS — D539 Nutritional anemia, unspecified: Secondary | ICD-10-CM | POA: Diagnosis present

## 2023-02-10 DIAGNOSIS — S82032A Displaced transverse fracture of left patella, initial encounter for closed fracture: Secondary | ICD-10-CM | POA: Diagnosis not present

## 2023-02-10 DIAGNOSIS — K921 Melena: Secondary | ICD-10-CM | POA: Diagnosis not present

## 2023-02-10 DIAGNOSIS — S0990XA Unspecified injury of head, initial encounter: Secondary | ICD-10-CM | POA: Diagnosis not present

## 2023-02-10 DIAGNOSIS — S42201A Unspecified fracture of upper end of right humerus, initial encounter for closed fracture: Secondary | ICD-10-CM | POA: Diagnosis not present

## 2023-02-10 DIAGNOSIS — Z91048 Other nonmedicinal substance allergy status: Secondary | ICD-10-CM

## 2023-02-10 DIAGNOSIS — I5032 Chronic diastolic (congestive) heart failure: Secondary | ICD-10-CM | POA: Diagnosis present

## 2023-02-10 DIAGNOSIS — R21 Rash and other nonspecific skin eruption: Secondary | ICD-10-CM | POA: Diagnosis present

## 2023-02-10 DIAGNOSIS — Z96651 Presence of right artificial knee joint: Secondary | ICD-10-CM | POA: Diagnosis not present

## 2023-02-10 DIAGNOSIS — Y92009 Unspecified place in unspecified non-institutional (private) residence as the place of occurrence of the external cause: Secondary | ICD-10-CM

## 2023-02-10 DIAGNOSIS — W010XXA Fall on same level from slipping, tripping and stumbling without subsequent striking against object, initial encounter: Secondary | ICD-10-CM | POA: Diagnosis present

## 2023-02-10 DIAGNOSIS — Z8673 Personal history of transient ischemic attack (TIA), and cerebral infarction without residual deficits: Secondary | ICD-10-CM

## 2023-02-10 DIAGNOSIS — S82002A Unspecified fracture of left patella, initial encounter for closed fracture: Secondary | ICD-10-CM | POA: Diagnosis not present

## 2023-02-10 DIAGNOSIS — M797 Fibromyalgia: Secondary | ICD-10-CM | POA: Diagnosis present

## 2023-02-10 DIAGNOSIS — F32A Depression, unspecified: Secondary | ICD-10-CM | POA: Diagnosis not present

## 2023-02-10 DIAGNOSIS — Z8249 Family history of ischemic heart disease and other diseases of the circulatory system: Secondary | ICD-10-CM

## 2023-02-10 DIAGNOSIS — J4520 Mild intermittent asthma, uncomplicated: Secondary | ICD-10-CM | POA: Diagnosis not present

## 2023-02-10 DIAGNOSIS — I110 Hypertensive heart disease with heart failure: Secondary | ICD-10-CM | POA: Diagnosis present

## 2023-02-10 DIAGNOSIS — D696 Thrombocytopenia, unspecified: Secondary | ICD-10-CM | POA: Diagnosis present

## 2023-02-10 DIAGNOSIS — Z886 Allergy status to analgesic agent status: Secondary | ICD-10-CM

## 2023-02-10 DIAGNOSIS — H3530 Unspecified macular degeneration: Secondary | ICD-10-CM | POA: Diagnosis present

## 2023-02-10 DIAGNOSIS — Z91040 Latex allergy status: Secondary | ICD-10-CM

## 2023-02-10 DIAGNOSIS — E039 Hypothyroidism, unspecified: Secondary | ICD-10-CM | POA: Diagnosis present

## 2023-02-10 DIAGNOSIS — M25561 Pain in right knee: Secondary | ICD-10-CM | POA: Diagnosis not present

## 2023-02-10 DIAGNOSIS — J4489 Other specified chronic obstructive pulmonary disease: Secondary | ICD-10-CM | POA: Diagnosis present

## 2023-02-10 DIAGNOSIS — K5900 Constipation, unspecified: Secondary | ICD-10-CM | POA: Diagnosis present

## 2023-02-10 DIAGNOSIS — Z882 Allergy status to sulfonamides status: Secondary | ICD-10-CM

## 2023-02-10 DIAGNOSIS — I471 Supraventricular tachycardia, unspecified: Secondary | ICD-10-CM | POA: Diagnosis not present

## 2023-02-10 DIAGNOSIS — J45909 Unspecified asthma, uncomplicated: Secondary | ICD-10-CM | POA: Diagnosis present

## 2023-02-10 DIAGNOSIS — M25562 Pain in left knee: Secondary | ICD-10-CM | POA: Diagnosis not present

## 2023-02-10 DIAGNOSIS — S42294A Other nondisplaced fracture of upper end of right humerus, initial encounter for closed fracture: Secondary | ICD-10-CM | POA: Diagnosis not present

## 2023-02-10 DIAGNOSIS — I10 Essential (primary) hypertension: Secondary | ICD-10-CM | POA: Diagnosis not present

## 2023-02-10 DIAGNOSIS — K7581 Nonalcoholic steatohepatitis (NASH): Secondary | ICD-10-CM | POA: Diagnosis not present

## 2023-02-10 DIAGNOSIS — K7460 Unspecified cirrhosis of liver: Secondary | ICD-10-CM | POA: Diagnosis present

## 2023-02-10 DIAGNOSIS — E876 Hypokalemia: Secondary | ICD-10-CM | POA: Diagnosis not present

## 2023-02-10 DIAGNOSIS — S82035A Nondisplaced transverse fracture of left patella, initial encounter for closed fracture: Secondary | ICD-10-CM | POA: Diagnosis not present

## 2023-02-10 DIAGNOSIS — R58 Hemorrhage, not elsewhere classified: Secondary | ICD-10-CM | POA: Diagnosis not present

## 2023-02-10 DIAGNOSIS — S022XXA Fracture of nasal bones, initial encounter for closed fracture: Secondary | ICD-10-CM | POA: Diagnosis not present

## 2023-02-10 DIAGNOSIS — M79604 Pain in right leg: Secondary | ICD-10-CM | POA: Diagnosis not present

## 2023-02-10 DIAGNOSIS — T1490XA Injury, unspecified, initial encounter: Secondary | ICD-10-CM | POA: Diagnosis not present

## 2023-02-10 DIAGNOSIS — R112 Nausea with vomiting, unspecified: Secondary | ICD-10-CM | POA: Diagnosis not present

## 2023-02-10 DIAGNOSIS — M810 Age-related osteoporosis without current pathological fracture: Secondary | ICD-10-CM | POA: Diagnosis not present

## 2023-02-10 DIAGNOSIS — Z8719 Personal history of other diseases of the digestive system: Secondary | ICD-10-CM | POA: Diagnosis not present

## 2023-02-10 DIAGNOSIS — K580 Irritable bowel syndrome with diarrhea: Secondary | ICD-10-CM | POA: Diagnosis present

## 2023-02-10 DIAGNOSIS — S82022A Displaced longitudinal fracture of left patella, initial encounter for closed fracture: Secondary | ICD-10-CM | POA: Diagnosis not present

## 2023-02-10 DIAGNOSIS — W19XXXA Unspecified fall, initial encounter: Secondary | ICD-10-CM | POA: Diagnosis not present

## 2023-02-10 DIAGNOSIS — Z743 Need for continuous supervision: Secondary | ICD-10-CM | POA: Diagnosis not present

## 2023-02-10 DIAGNOSIS — M79603 Pain in arm, unspecified: Secondary | ICD-10-CM | POA: Diagnosis not present

## 2023-02-10 DIAGNOSIS — R079 Chest pain, unspecified: Secondary | ICD-10-CM | POA: Diagnosis not present

## 2023-02-10 DIAGNOSIS — Z79899 Other long term (current) drug therapy: Secondary | ICD-10-CM

## 2023-02-10 DIAGNOSIS — K219 Gastro-esophageal reflux disease without esophagitis: Secondary | ICD-10-CM | POA: Diagnosis present

## 2023-02-10 DIAGNOSIS — G4733 Obstructive sleep apnea (adult) (pediatric): Secondary | ICD-10-CM | POA: Diagnosis present

## 2023-02-10 DIAGNOSIS — S42291A Other displaced fracture of upper end of right humerus, initial encounter for closed fracture: Secondary | ICD-10-CM | POA: Diagnosis not present

## 2023-02-10 DIAGNOSIS — K922 Gastrointestinal hemorrhage, unspecified: Secondary | ICD-10-CM | POA: Diagnosis not present

## 2023-02-10 DIAGNOSIS — Z961 Presence of intraocular lens: Secondary | ICD-10-CM | POA: Diagnosis present

## 2023-02-10 DIAGNOSIS — Z9842 Cataract extraction status, left eye: Secondary | ICD-10-CM

## 2023-02-10 DIAGNOSIS — Z79890 Hormone replacement therapy (postmenopausal): Secondary | ICD-10-CM

## 2023-02-10 DIAGNOSIS — Z9181 History of falling: Secondary | ICD-10-CM

## 2023-02-10 DIAGNOSIS — E782 Mixed hyperlipidemia: Secondary | ICD-10-CM | POA: Diagnosis not present

## 2023-02-10 DIAGNOSIS — Z9841 Cataract extraction status, right eye: Secondary | ICD-10-CM

## 2023-02-10 DIAGNOSIS — Z885 Allergy status to narcotic agent status: Secondary | ICD-10-CM

## 2023-02-10 DIAGNOSIS — M7989 Other specified soft tissue disorders: Secondary | ICD-10-CM | POA: Diagnosis not present

## 2023-02-10 DIAGNOSIS — M25511 Pain in right shoulder: Secondary | ICD-10-CM | POA: Diagnosis not present

## 2023-02-10 LAB — BASIC METABOLIC PANEL
Anion gap: 9 (ref 5–15)
BUN: 18 mg/dL (ref 8–23)
CO2: 20 mmol/L — ABNORMAL LOW (ref 22–32)
Calcium: 8.3 mg/dL — ABNORMAL LOW (ref 8.9–10.3)
Chloride: 109 mmol/L (ref 98–111)
Creatinine, Ser: 1.02 mg/dL — ABNORMAL HIGH (ref 0.44–1.00)
GFR, Estimated: >60 mL/min (ref >60)
Glucose, Bld: 97 mg/dL (ref 70–99)
Potassium: 3.6 mmol/L (ref 3.5–5.1)
Sodium: 138 mmol/L (ref 135–145)

## 2023-02-10 LAB — PROTIME-INR
INR: 1.1 (ref 0.8–1.2)
Prothrombin Time: 14.2 seconds (ref 11.4–15.2)

## 2023-02-10 LAB — CBC WITH DIFFERENTIAL/PLATELET
Abs Immature Granulocytes: 0.02 10*3/uL (ref 0.00–0.07)
Basophils Absolute: 0 10*3/uL (ref 0.0–0.1)
Basophils Relative: 0 %
Eosinophils Absolute: 0.1 10*3/uL (ref 0.0–0.5)
Eosinophils Relative: 2 %
HCT: 30.8 % — ABNORMAL LOW (ref 36.0–46.0)
Hemoglobin: 10.5 g/dL — ABNORMAL LOW (ref 12.0–15.0)
Immature Granulocytes: 0 %
Lymphocytes Relative: 30 %
Lymphs Abs: 1.6 10*3/uL (ref 0.7–4.0)
MCH: 34.2 pg — ABNORMAL HIGH (ref 26.0–34.0)
MCHC: 34.1 g/dL (ref 30.0–36.0)
MCV: 100.3 fL — ABNORMAL HIGH (ref 80.0–100.0)
Monocytes Absolute: 0.5 10*3/uL (ref 0.1–1.0)
Monocytes Relative: 9 %
Neutro Abs: 3.2 10*3/uL (ref 1.7–7.7)
Neutrophils Relative %: 59 %
Platelets: 106 10*3/uL — ABNORMAL LOW (ref 150–400)
RBC: 3.07 MIL/uL — ABNORMAL LOW (ref 3.87–5.11)
RDW: 13.7 % (ref 11.5–15.5)
WBC: 5.4 10*3/uL (ref 4.0–10.5)
nRBC: 0 % (ref 0.0–0.2)

## 2023-02-10 LAB — TYPE AND SCREEN
ABO/RH(D): A POS
Antibody Screen: NEGATIVE

## 2023-02-10 LAB — CBG MONITORING, ED: Glucose-Capillary: 72 mg/dL (ref 70–99)

## 2023-02-10 MED ORDER — MORPHINE SULFATE (PF) 4 MG/ML IV SOLN
4.0000 mg | Freq: Once | INTRAVENOUS | Status: AC
  Administered 2023-02-10: 4 mg via INTRAVENOUS
  Filled 2023-02-10: qty 1

## 2023-02-10 MED ORDER — FENTANYL CITRATE PF 50 MCG/ML IJ SOSY
50.0000 ug | PREFILLED_SYRINGE | Freq: Once | INTRAMUSCULAR | Status: AC
  Administered 2023-02-10: 50 ug via INTRAVENOUS
  Filled 2023-02-10: qty 1

## 2023-02-10 MED ORDER — ONDANSETRON HCL 4 MG/2ML IJ SOLN
4.0000 mg | Freq: Once | INTRAMUSCULAR | Status: AC
  Administered 2023-02-10: 4 mg via INTRAVENOUS
  Filled 2023-02-10: qty 2

## 2023-02-10 MED ORDER — TETANUS-DIPHTH-ACELL PERTUSSIS 5-2.5-18.5 LF-MCG/0.5 IM SUSY
0.5000 mL | PREFILLED_SYRINGE | Freq: Once | INTRAMUSCULAR | Status: AC
  Administered 2023-02-10: 0.5 mL via INTRAMUSCULAR
  Filled 2023-02-10: qty 0.5

## 2023-02-10 NOTE — H&P (Addendum)
History and Physical    Patient: Kimberly [name] [id number] DOB: Jan 10, 1956 DOA: 02/10/2023 DOS: the patient was seen and examined on 02/11/2023 PCP: Glenda Chroman, MD  Patient coming from: Home  Chief Complaint:  Chief Complaint  Patient presents with   Fall   HPI: Kimberly [name] is a 67 y.o. female with medical history significant of asthma, GERD, hypertension, hyperlipidemia, cirrhosis who presents to the emergency department via EMS after sustaining a fall.  Patient states that she felt forward striking her face and she tried to use her right side to break the fall, she presented with nosebleed with pain on face head and neck as well as both knees and right arm. Patient denies loss of consciousness.   ED Course:  In the emergency department, pulse was 59 bpm, BP was 162/77, other vital signs were within normal range.  Workup in the ED showed normal BMP except for bicarb of 20 and creatinine of 1.02.  CBC showed WBC 5.4, hemoglobin 10.5, hematocrit 30.8, MCV 100.3 platelets 106. Right shoulder x-ray showed osteopenia, probable humeral neck fracture, possible fracture of the scapula. Left ankle x-ray showed soft tissue swelling and degenerative change, without acute superimposed osseous finding.   Left knee x-ray showed slightly displaced mid body transverse patella fracture.  Large knee joint effusion.  Leukopenia with mild degenerative changes CT of upper right extremity without contrast showed comminuted nondisplaced fracture of the right humeral neck. CT head without contrast, CT maxillofacial without contrast and CT cervical spine without contrast showed no acute intracranial process, nasal fractures, concha bullosa, no traumatic abnormality of the cervical spine, degenerative disc disease, left maxillary antrum cyst or polyp. Patient was treated with IV fentanyl 50 mcg x 1, morphine was given, Zofran was given and Tdap was also given.  Orthopedic surgeon (Dr. Aline Brochure) was  consulted and recommended admitting patient with plan to consult on patient in the morning.  Hospitalist was asked to admit patient for further evaluation and management.  Review of Systems: Review of systems as noted in the HPI. All other systems reviewed and are negative.   Past Medical History:  Diagnosis Date   Anemia    PMH: as a child   Arthritis    Asthma    CAD (coronary artery disease)    nonobstructive by cath, 6/08 (false positive Cardiolite) normal stress echo, 7/11   CHF (congestive heart failure) (HCC)    Chronic back pain    Chronic bronchitis (HCC)    Cirrhosis (HCC)    Complication of anesthesia    had an asthma attack when woke up from procedure   Complication of anesthesia    asthma attack with one surgery   COPD (chronic obstructive pulmonary disease) ([place])    Degenerative joint disease    Depression    Diabetes mellitus without complication (HCC)    DJD (degenerative joint disease)    Fatty liver    Fibromyalgia    GERD (gastroesophageal reflux disease)    Glaucoma    Headache(784.0)    Heart failure, diastolic, chronic (HCC)    Heart murmur    PMH:As a child only   Hernia of abdominal cavity    "upper and lower hernia"   History of hiatal hernia    HTN (hypertension)    Hypertension    Hypothyroidism    IBS (irritable bowel syndrome)    Morbid obesity (HCC)    Neuropathy    associated with diabetes   Obstructive sleep apnea  Pancreatitis    Pinched nerve in neck    Pneumonia    as a child   PONV (postoperative nausea and vomiting)    Pulmonary hypertension (HCC)    Rheumatic fever    PMH: as a child   Seizures ([place])    PMH: only as a child   Seizures ([place])    in childhood   Shortness of breath    Sleep apnea    Spinal stenosis    Stroke ([place])    12/09/2013   Past Surgical History:  Procedure Laterality Date   ABDOMINAL HYSTERECTOMY     ABDOMINAL HYSTERECTOMY     partial hysterectomy   APPENDECTOMY     BACK SURGERY     BACK  SURGERY     disc surgery with complications and damage to right side   BARIATRIC SURGERY     DS in June 2020   [place]  2000, 2009   BIOPSY  06/16/2021   Procedure: BIOPSY;  Surgeon: Harvel Quale, MD;  [facility];  Service: Gastroenterology;;   BREAST CYST EXCISION Left    CARDIAC CATHETERIZATION     2009   CATARACT EXTRACTION W/ INTRAOCULAR LENS  IMPLANT, BILATERAL     CHOLECYSTECTOMY  11/22/1992   CHOLECYSTECTOMY     COLONOSCOPY N/A 08/01/2013   Procedure: COLONOSCOPY;  Surgeon: Rogene Houston, MD;  [facility];  Service: Endoscopy;  Laterality: N/A;  930   COLONOSCOPY N/A 08/03/2017   Procedure: COLONOSCOPY;  Surgeon: Rogene Houston, MD;  [facility];  Service: Endoscopy;  Laterality: N/A;  12:00   CYST EXCISION     Left breast   ESOPHAGOGASTRODUODENOSCOPY (EGD) WITH PROPOFOL N/A 06/16/2021   Procedure: ESOPHAGOGASTRODUODENOSCOPY (EGD) WITH PROPOFOL;  Surgeon: Harvel Quale, MD;  [facility];  Service: Gastroenterology;  Laterality: N/A;  12:50   EXCISIONAL HEMORRHOIDECTOMY     EYE SURGERY  11/22/1965   EYE SURGERY     bilateral cataract removal with lens implants   EYE SURGERY     growth removed from right eye lid   EYE SURGERY     bilateral eye surgery age 55 t"to straighten eyes'   heel (other)  11/23/2003   HEEL SPUR SURGERY     Right heel - growth removal and rebuilding of heel   HEMORRHOID SURGERY     HERNIA REPAIR     KNEE ARTHROSCOPY Bilateral    LUMBAR LAMINECTOMY/DECOMPRESSION MICRODISCECTOMY Right 08/23/2014   Procedure: LUMBAR LAMINECTOMY/DECOMPRESSION MICRODISCECTOMY 1 LEVEL;  Surgeon: Charlie Pitter, MD;  Location: [facility];  Service: Neurosurgery;  Laterality: Right;  LUMBAR LAMINECTOMY/DECOMPRESSION MICRODISCECTOMY 1 LEVEL LUMBAR 2-3   ovaries removed  11/22/2001   PARTIAL HYSTERECTOMY  11/23/1979   SHOULDER ARTHROSCOPY WITH DISTAL CLAVICLE RESECTION Left  08/06/2022   Procedure: SHOULDER ARTHROSCOPY WITH DISTAL CLAVICLE RESECTION;  Surgeon: Nicholes Stairs, MD;  Location: [place];  Service: Orthopedics;  Laterality: Left;   SHOULDER ARTHROSCOPY WITH ROTATOR CUFF REPAIR Left 08/06/2022   Procedure: SHOULDER ARTHROSCOPY WITH ROTATOR CUFF REPAIR;  Surgeon: Nicholes Stairs, MD;  Location: [place];  Service: Orthopedics;  Laterality: Left;   SHOULDER ARTHROSCOPY WITH SUBACROMIAL DECOMPRESSION Left 08/06/2022   Procedure: SHOULDER ARTHROSCOPY WITH SUBACROMIAL [place] EXTENSIVE DEBRIDEMENT;  Surgeon: Nicholes Stairs, MD;  Location: [place];  Service: Orthopedics;  Laterality: Left;   SPHINCTEROTOMY     spinahatomy  11/22/1990   TOTAL KNEE ARTHROPLASTY Right 11/05/2020   Procedure: RIGHT  TOTAL KNEE ARTHROPLASTY;  Surgeon: Marybelle Killings, MD;  Location: [facility];  Service: Orthopedics;  Laterality: Right;   TUBAL LIGATION  11/22/1973   TUBAL LIGATION      Social History:  reports that she has never smoked. She has never been exposed to tobacco smoke. She has never used smokeless tobacco. She reports that she does not drink alcohol and does not use drugs.   Allergies  Allergen Reactions   Cinnamon Anaphylaxis    Denies allergy on 08/06/22, states "It was an interaction when mixed w/another med I no longer take"   Hydrocodone Other (See Comments)    Over sedation   Naproxen Anaphylaxis and Other (See Comments)    Throat swelling   Other Shortness Of Breath and Other (See Comments)    Local anesthesia Had asthma attack  08/06/22 tolerated amide local anesthetic in peripheral nerve block.   Oxycodone Other (See Comments)    Over sedation   Feldene [Piroxicam] Nausea Only   Lyrica [Pregabalin] Other (See Comments)    Altered mental status for prolonged period   Nsaids     Avoid due to history of gastic bypass   Phenergan [Promethazine Hcl] Other (See Comments)    triggers asthma attacks   Sulfa Antibiotics Other (See  Comments)    (Yeast infection, per patient.)   Latex Rash and Other (See Comments)    "Blistery" rash.    Tape Rash and Other (See Comments)    Do not use adhesive tape; okay to use paper tape. 08/06/22 tolerated adhesive tape for peripheral IV    Family History  Problem Relation Age of Onset   Cancer Other    Heart failure Other    Colon cancer Brother      Prior to Admission medications   Medication Sig Start Date End Date Taking? Authorizing Provider  albuterol (VENTOLIN HFA) 108 (90 Base) MCG/ACT inhaler Inhale 2 puffs into the lungs 4 (four) times daily as needed for shortness of breath. 09/22/20  Yes [provider]  atorvastatin (LIPITOR) 10 MG tablet Take 10 mg by mouth daily. 08/15/20  Yes [provider]  BREO ELLIPTA 200-25 MCG/INH AEPB Inhale 2 puffs into the lungs daily. 08/22/20  Yes [provider]  Carboxymethylcellulose Sodium (THERATEARS) 0.25 % SOLN Place 1 drop into both eyes in the morning, at noon, and at bedtime.   Yes [provider]  cyclobenzaprine (FLEXERIL) 5 MG tablet Take 5 mg by mouth every 8 (eight) hours as needed for muscle spasms. 05/06/22  Yes [provider]  dicyclomine (BENTYL) 10 MG capsule Take 1 capsule (10 mg total) by mouth 3 (three) times daily as needed for spasms. 01/17/23  Yes Carlan, Chelsea L, NP  famotidine (PEPCID) 40 MG tablet Take 40 mg by mouth daily.   Yes [provider]  furosemide (LASIX) 20 MG tablet Take 20 mg by mouth 2 (two) times daily. 10/23/20  Yes [provider]  hydrocortisone (ANUSOL-HC) 2.5 % rectal cream Place 1 Application rectally 2 (two) times daily. 01/17/23  Yes Carlan, Chelsea L, NP  hydrOXYzine (ATARAX) 25 MG tablet Take by mouth. 07/16/20  Yes [provider]  levothyroxine (SYNTHROID) 112 MCG tablet Take 112 mcg by mouth daily before breakfast. 09/22/20  Yes [provider]  Multiple Vitamin (MULTIVITAMIN PO) Take 2 tablets by mouth  daily. ADEK vitamin chewable   Yes [provider]  Olopatadine HCl (PATADAY OP) Place 1 drop into both eyes in the morning and at  bedtime.   Yes [provider]  ondansetron (ZOFRAN) 4 MG tablet TAKE 1 TABLET BY MOUTH EVERY 8 HOURS AS NEEDED FOR NAUSEA OR VOMITING 01/10/23  Yes Carlan, Chelsea L, NP  pantoprazole (PROTONIX) 40 MG tablet Take 40 mg by mouth daily. 10/23/20  Yes [provider]  PRESCRIPTION MEDICATION Injection in both eyes every 10 weeks for macular deg   Yes [provider]  spironolactone (ALDACTONE) 25 MG tablet Take 25 mg by mouth daily. 11/01/19  Yes [provider]  Calcium Carb-Cholecalciferol (CALCIUM 500+D PO) Take 1 tablet by mouth in the morning and at bedtime. Patient not taking: Reported on 01/17/2023    [provider]  Cholecalciferol (VITAMIN D) 50 MCG (2000 UT) tablet Take 2,000 Units by mouth daily. Patient not taking: Reported on 01/17/2023    [provider]  Cyanocobalamin (VITAMIN B-12) 5000 MCG SUBL Place 5,000 mcg under the tongue every other day. Patient not taking: Reported on 01/17/2023    [provider]  oxyCODONE-acetaminophen (PERCOCET) 7.5-325 MG tablet Take 1 tablet by mouth every 4 (four) hours as needed for severe pain. Patient not taking: Reported on 02/10/2023 08/06/22 08/06/23  Nicholes Stairs, MD    Physical Exam: BP 108/87   Pulse 78   Temp 98.3 F (36.8 C) (Oral)   Resp 17   SpO2 100%   General: 67 y.o. year-old female well developed well nourished in no acute distress.  Alert and oriented x3. HEENT: EOMI, PERRLA, tender to palpation of nasal bridge, noted dried blood on nose Neck: Supple, trachea medial Cardiovascular: Regular rate and rhythm with no rubs or gallops.  No thyromegaly or JVD noted.  No lower extremity edema. 2/4 pulses in all 4 extremities. Respiratory: Clear to auscultation with no wheezes or rales. Good inspiratory effort. Abdomen: Soft,  nontender nondistended with normal bowel sounds x4 quadrants. Muskuloskeletal: Tender to palpation of right upper extremity including proximal humerus, right wrist and right shoulder.  LUE without restriction of ROM.  Tender to palpation of right knee and left knee and ankle. Neuro: CN II-XII intact, sensation, reflexes intact Skin: No ulcerative lesions noted or rashes Psychiatry: Judgement and insight appear normal. Mood is appropriate for condition and setting          Labs on Admission:  Basic Metabolic Panel: Recent Labs  Lab 02/10/23 2119  NA 138  K 3.6  CL 109  CO2 20*  GLUCOSE 97  BUN 18  CREATININE 1.02*  CALCIUM 8.3*   Liver Function Tests: No results for input(s): "AST", "ALT", "ALKPHOS", "BILITOT", "PROT", "ALBUMIN" in the last 168 hours. No results for input(s): "LIPASE", "AMYLASE" in the last 168 hours. No results for input(s): "AMMONIA" in the last 168 hours. CBC: Recent Labs  Lab 02/10/23 2154  WBC 5.4  NEUTROABS 3.2  HGB 10.5*  HCT 30.8*  MCV 100.3*  PLT 106*   Cardiac Enzymes: No results for input(s): "CKTOTAL", "CKMB", "CKMBINDEX", "TROPONINI" in the last 168 hours.  BNP (last 3 results) No results for input(s): "BNP" in the last 8760 hours.  ProBNP (last 3 results) No results for input(s): "PROBNP" in the last 8760 hours.  CBG: Recent Labs  Lab 02/10/23 2016  GLUCAP 72    Radiological Exams on Admission: CT Shoulder Right Wo Contrast  Result Date: 02/10/2023 CLINICAL DATA:  Trauma to the right shoulder. EXAM: CT OF THE UPPER RIGHT EXTREMITY WITHOUT CONTRAST TECHNIQUE: Multidetector CT imaging of the upper right extremity was performed according to the standard  protocol. RADIATION DOSE REDUCTION: This exam was performed according to the departmental dose-optimization program which includes automated exposure control, adjustment of the mA and/or kV according to patient size and/or use of iterative reconstruction technique. COMPARISON:  Right  shoulder radiograph dated 02/10/2023. FINDINGS: Bones/Joint/Cartilage There is comminuted nondisplaced fracture of the right humeral neck. No other acute fracture. No dislocation. The bones are osteopenic. Degenerative changes and spurring of the inferior bony glenoid. No joint effusion. Ligaments Suboptimally assessed by CT. Muscles and Tendons No acute findings. Soft tissues Soft tissue edema.  No fluid collection or hematoma. IMPRESSION: Comminuted nondisplaced fracture of the right humeral neck. Electronically Signed   By: Anner Crete M.D.   On: 02/10/2023 19:23   CT Head Wo Contrast  Result Date: 02/10/2023 CLINICAL DATA:  Trauma EXAM: CT HEAD WITHOUT CONTRAST CT MAXILLOFACIAL WITHOUT CONTRAST CT CERVICAL SPINE WITHOUT CONTRAST TECHNIQUE: Multidetector CT imaging of the head, cervical spine, and maxillofacial structures were performed using the standard protocol without intravenous contrast. Multiplanar CT image reconstructions of the cervical spine and maxillofacial structures were also generated. RADIATION DOSE REDUCTION: This exam was performed according to the departmental dose-optimization program which includes automated exposure control, adjustment of the mA and/or kV according to patient size and/or use of iterative reconstruction technique. COMPARISON:  None Available. FINDINGS: CT HEAD FINDINGS Brain: There is periventricular white matter decreased attenuation consistent with small vessel ischemic changes. Gray-white differentiation is preserved. No acute intracranial hemorrhage, mass effect or shift. No hydrocephalus. Vascular: No hyperdense vessel or unexpected calcification. Skull: Normal. Negative for fracture or focal lesion. Other: None. CT MAXILLOFACIAL FINDINGS Osseous: Nasal septal deviation to the right. Bilateral nasal fractures displaced by few mm. Osseous structures are otherwise intact. Orbits: Negative. No traumatic or inflammatory finding. Sinuses: 3 cm left maxillary antrum  cyst or polyp. Left-sided concha bullosa. No air-fluid levels or bony destructive processes. Soft tissues: Negative. CT CERVICAL SPINE FINDINGS Alignment: Normal. Skull base and vertebrae: No acute fracture. No primary bone lesion or focal pathologic process. Soft tissues and spinal canal: No prevertebral fluid or swelling. No visible canal hematoma. Disc levels: Disc space narrowing and marginal osteophyte formation C5-6 and C6-7. Osteoarthritis at C1-C2. Upper chest: Negative. IMPRESSION: 1. Periventricular white matter changes consistent with chronic small vessel ischemia. 2. No acute intracranial process identified. 3. Nasal fractures. 4. Concha bullosa. 5. Left maxillary antrum cyst or polyp. 6. Degenerative disc disease. 7. No traumatic abnormalities of the cervical spine. Electronically Signed   By: Sammie Bench M.D.   On: 02/10/2023 17:37   CT Cervical Spine Wo Contrast  Result Date: 02/10/2023 CLINICAL DATA:  Trauma EXAM: CT HEAD WITHOUT CONTRAST CT MAXILLOFACIAL WITHOUT CONTRAST CT CERVICAL SPINE WITHOUT CONTRAST TECHNIQUE: Multidetector CT imaging of the head, cervical spine, and maxillofacial structures were performed using the standard protocol without intravenous contrast. Multiplanar CT image reconstructions of the cervical spine and maxillofacial structures were also generated. RADIATION DOSE REDUCTION: This exam was performed according to the departmental dose-optimization program which includes automated exposure control, adjustment of the mA and/or kV according to patient size and/or use of iterative reconstruction technique. COMPARISON:  None Available. FINDINGS: CT HEAD FINDINGS Brain: There is periventricular white matter decreased attenuation consistent with small vessel ischemic changes. Gray-white differentiation is preserved. No acute intracranial hemorrhage, mass effect or shift. No hydrocephalus. Vascular: No hyperdense vessel or unexpected calcification. Skull: Normal. Negative  for fracture or focal lesion. Other: None. CT MAXILLOFACIAL FINDINGS Osseous: Nasal septal deviation to the right. Bilateral nasal fractures displaced  by few mm. Osseous structures are otherwise intact. Orbits: Negative. No traumatic or inflammatory finding. Sinuses: 3 cm left maxillary antrum cyst or polyp. Left-sided concha bullosa. No air-fluid levels or bony destructive processes. Soft tissues: Negative. CT CERVICAL SPINE FINDINGS Alignment: Normal. Skull base and vertebrae: No acute fracture. No primary bone lesion or focal pathologic process. Soft tissues and spinal canal: No prevertebral fluid or swelling. No visible canal hematoma. Disc levels: Disc space narrowing and marginal osteophyte formation C5-6 and C6-7. Osteoarthritis at C1-C2. Upper chest: Negative. IMPRESSION: 1. Periventricular white matter changes consistent with chronic small vessel ischemia. 2. No acute intracranial process identified. 3. Nasal fractures. 4. Concha bullosa. 5. Left maxillary antrum cyst or polyp. 6. Degenerative disc disease. 7. No traumatic abnormalities of the cervical spine. Electronically Signed   By: Sammie Bench M.D.   On: 02/10/2023 17:37   CT Maxillofacial WO CM  Result Date: 02/10/2023 CLINICAL DATA:  Trauma EXAM: CT HEAD WITHOUT CONTRAST CT MAXILLOFACIAL WITHOUT CONTRAST CT CERVICAL SPINE WITHOUT CONTRAST TECHNIQUE: Multidetector CT imaging of the head, cervical spine, and maxillofacial structures were performed using the standard protocol without intravenous contrast. Multiplanar CT image reconstructions of the cervical spine and maxillofacial structures were also generated. RADIATION DOSE REDUCTION: This exam was performed according to the departmental dose-optimization program which includes automated exposure control, adjustment of the mA and/or kV according to patient size and/or use of iterative reconstruction technique. COMPARISON:  None Available. FINDINGS: CT HEAD FINDINGS Brain: There is  periventricular white matter decreased attenuation consistent with small vessel ischemic changes. Gray-white differentiation is preserved. No acute intracranial hemorrhage, mass effect or shift. No hydrocephalus. Vascular: No hyperdense vessel or unexpected calcification. Skull: Normal. Negative for fracture or focal lesion. Other: None. CT MAXILLOFACIAL FINDINGS Osseous: Nasal septal deviation to the right. Bilateral nasal fractures displaced by few mm. Osseous structures are otherwise intact. Orbits: Negative. No traumatic or inflammatory finding. Sinuses: 3 cm left maxillary antrum cyst or polyp. Left-sided concha bullosa. No air-fluid levels or bony destructive processes. Soft tissues: Negative. CT CERVICAL SPINE FINDINGS Alignment: Normal. Skull base and vertebrae: No acute fracture. No primary bone lesion or focal pathologic process. Soft tissues and spinal canal: No prevertebral fluid or swelling. No visible canal hematoma. Disc levels: Disc space narrowing and marginal osteophyte formation C5-6 and C6-7. Osteoarthritis at C1-C2. Upper chest: Negative. IMPRESSION: 1. Periventricular white matter changes consistent with chronic small vessel ischemia. 2. No acute intracranial process identified. 3. Nasal fractures. 4. Concha bullosa. 5. Left maxillary antrum cyst or polyp. 6. Degenerative disc disease. 7. No traumatic abnormalities of the cervical spine. Electronically Signed   By: Sammie Bench M.D.   On: 02/10/2023 17:37   DG Shoulder Right  Result Date: 02/10/2023 CLINICAL DATA:  Fall, pain EXAM: RIGHT SHOULDER - 2 VIEW COMPARISON:  None Available. FINDINGS: Osseous structures appear osteopenic. There is glenohumeral and acromioclavicular degenerative change with osteophytes. There is suggestion of a discontinuity in the cortex of the proximal humerus surgical neck which could indicate a fracture. Distal scapular dyskinesia could indicate a fractures well. These findings can be evaluated and confirmed  with a CT. IMPRESSION: 1. Osteopenia. 2. Probable humeral neck fracture. 3. Possible fracture of the scapula. 4. CT recommended for further evaluation. Electronically Signed   By: Sammie Bench M.D.   On: 02/10/2023 16:57   DG Chest 1 View  Result Date: 02/10/2023 CLINICAL DATA:  Fall and pain EXAM: CHEST  1 VIEW COMPARISON:  06/11/2022 FINDINGS: Atypical appearance of the right  humeral head, suboptimally evaluated. Please see dedicated radiographs. Midline trachea. Mild cardiomegaly. Mediastinal contours otherwise within normal limits. No pleural effusion or pneumothorax. No congestive failure. Clear lungs. IMPRESSION: Cardiomegaly without congestive failure. Electronically Signed   By: Abigail Miyamoto M.D.   On: 02/10/2023 16:56   DG Knee Complete 4 Views Right  Result Date: 02/10/2023 CLINICAL DATA:  Pain fall EXAM: RIGHT KNEE - COMPLETE 4+ VIEW COMPARISON:  None Available. FINDINGS: Status post right total knee arthroplasty. No acute fracture dislocation or subluxation. No periprosthetic lucency to indicate loosening. No effusion. Osseous structures are osteopenic. No focal osteolytic or osteoblastic changes. IMPRESSION: Osteopenia. Otherwise unremarkable examination status post total knee arthroplasty. Electronically Signed   By: Sammie Bench M.D.   On: 02/10/2023 16:55   DG Ankle Complete Left  Result Date: 02/10/2023 CLINICAL DATA:  Fall and pain EXAM: LEFT ANKLE COMPLETE - 3+ VIEW COMPARISON:  09/29/2016 foot radiographs, without report FINDINGS: Diffuse soft tissue swelling. Mild to moderate osteoarthritis about the ankle. No acute fracture or dislocation. Tiny calcaneal spur. Base of fifth metatarsal and talar dome intact. IMPRESSION: Soft tissue swelling and degenerative change, without acute superimposed osseous finding. Electronically Signed   By: Abigail Miyamoto M.D.   On: 02/10/2023 16:54   DG Knee Complete 4 Views Left  Result Date: 02/10/2023 CLINICAL DATA:  Pain after fall EXAM:  LEFT KNEE - COMPLETE 4 VIEW COMPARISON:  None Available. FINDINGS: Osteopenia there is comminuted mildly displaced fracture of the mid waist of the patella. Associated large joint effusion. Hyperostosis with osteophytes seen of all 3 compartments which are small. There is also joint space loss of the medial compartment. IMPRESSION: Slightly displaced midbody transverse patellar fracture. Large knee joint effusion. Osteopenia with mild degenerative changes. Electronically Signed   By: Jill Side M.D.   On: 02/10/2023 16:49   DG Wrist Complete Right  Result Date: 02/10/2023 CLINICAL DATA:  Fall.  Pain. EXAM: RIGHT WRIST - COMPLETE 3+ VIEW COMPARISON:  Right hand radiographs 09/29/2016 FINDINGS: There is diffuse decreased bone mineralization. There is again severe thumb carpometacarpal joint space narrowing with subchondral sclerosis and peripheral osteophytosis. Mild medial greater than lateral triscaphe joint space narrowing and subchondral cystic changes. Mild-to-moderate joint space narrowing of the interphalangeal joints diffusely. No acute fracture is seen. No dislocation. IMPRESSION: 1. No acute fracture. 2. Severe thumb carpometacarpal osteoarthritis. Electronically Signed   By: Yvonne Kendall M.D.   On: 02/10/2023 16:48    EKG: I independently viewed the EKG done and my findings are as followed: EKG was not done in the ED  Assessment/Plan Present on Admission:  Closed fracture of right proximal humerus  Gastroesophageal reflux disease without esophagitis  Essential hypertension  Mixed hyperlipidemia  Asthma in adult  Acquired hypothyroidism  Principal Problem:   Closed fracture of right proximal humerus Active Problems:   Mixed hyperlipidemia   Acquired hypothyroidism   Essential hypertension   Asthma in adult   Gastroesophageal reflux disease without esophagitis   Left patella fracture   Accidental fall   GI bleed   Nasal fracture   Thrombocytopenia (HCC)   History of  cirrhosis  Right proximal humerus fracture Left patella fracture Accidental fall Continue fall precaution  Continue morphine 2 mg every 2 hours as needed Patient will be placed n.p.o. at midnight Tdap was given Orthopedic surgeon (Dr. Laverta Baltimore) was already consulted and will follow-up on patient in the morning  Nasal fracture Conservative measures at this time with plan to consider ENT consult for worsening  symptoms  GI bleed H/H= 10.5/30.8, this was 12.8/37.0 on 01/17/2023 Hemoccult was positive She denies vomiting of blood or blood in stool Continue IV Protonix 40 mg twice daily Patient will be placed n.p.o. at midnight Gastroenterologist  will be consulted in the morning  Thrombocytopenia possibly secondary to patient's history of cirrhosis Platelets 106 Continue to monitor platelet levels with morning labs  History of cirrhosis Lasix and Spironolactone will be held at this time due to soft BP  Elevated MCV MCV 100.3.  Vitamin B12 and folate levels will be checked  GERD Continue Pepcid and Protonix  Essential hypertension Lasix and spironolactone held at this time due to soft BP  Mixed hyperlipidemia Continue Lipitor  Asthma Continue Ventolin, Breo Ellipta  Acquired hypothyroidism Continue Synthroid   DVT prophylaxis: SCDs   Code Status: Full code   Consults: Gastroenterology, orthopedic surgery (by EDP)  Family Communication: None at bedside  Severity of Illness: The appropriate patient status for this patient is INPATIENT. Inpatient status is judged to be reasonable and necessary in order to provide the required intensity of service to ensure the patient's safety. The patient's presenting symptoms, physical exam findings, and initial radiographic and laboratory data in the context of their chronic comorbidities is felt to place them at high risk for further clinical deterioration. Furthermore, it is not anticipated that the patient will be medically stable  for discharge from the hospital within 2 midnights of admission.   * I certify that at the point of admission it is my clinical judgment that the patient will require inpatient hospital care spanning beyond 2 midnights from the point of admission due to high intensity of service, high risk for further deterioration and high frequency of surveillance required.*  Author: Bernadette Hoit, DO 02/11/2023 1:07 AM  For on call review [email or web address].

## 2023-02-10 NOTE — ED Provider Notes (Signed)
[facility] Provider Note   CSN: [id number] Arrival date & time: 02/10/23  1512     History  Chief Complaint  Patient presents with   Lytle Michaels    Kimberly [name] is a 67 y.o. female.  She is here for evaluation of injuries after a trip and fall.  She is brought in by EMS.  She said she fell forward striking her face and trying to break her fall with her right side.  Prior history of stroke with some weakness on the right.  She denies any loss of consciousness.  She is not on blood thinners.  Complaining of pain of her face and head and neck along with right arm both knees.  She does not recall her last tetanus shot.  The history is provided by the patient and the EMS personnel.  Fall This is a new problem. The current episode started less than 1 hour ago. The problem has not changed since onset.Associated symptoms include headaches. Pertinent negatives include no chest pain, no abdominal pain and no shortness of breath. The symptoms are aggravated by bending and twisting. She has tried nothing for the symptoms. The treatment provided no relief.       Home Medications Prior to Admission medications   Medication Sig Start Date End Date Taking? Authorizing Provider  albuterol (VENTOLIN HFA) 108 (90 Base) MCG/ACT inhaler Inhale 2 puffs into the lungs 4 (four) times daily as needed for shortness of breath. 09/22/20   [provider]  atorvastatin (LIPITOR) 10 MG tablet Take 10 mg by mouth daily. 08/15/20   [provider]  BREO ELLIPTA 200-25 MCG/INH AEPB Inhale 2 puffs into the lungs daily. 08/22/20   [provider]  Calcium Carb-Cholecalciferol (CALCIUM 500+D PO) Take 1 tablet by mouth in the morning and at bedtime. Patient not taking: Reported on 01/17/2023    [provider]  Carboxymethylcellulose Sodium (THERATEARS) 0.25 % SOLN Place 1 drop into both eyes in the morning, at noon, and at bedtime.    [provider]  Cholecalciferol (VITAMIN D) 50 MCG (2000 UT) tablet Take 2,000 Units by mouth daily. Patient not taking: Reported on 01/17/2023    [provider]  Cyanocobalamin (VITAMIN B-12) 5000 MCG SUBL Place 5,000 mcg under the tongue every other day. Patient not taking: Reported on 01/17/2023    [provider]  cyclobenzaprine (FLEXERIL) 5 MG tablet Take 5 mg by mouth every 8 (eight) hours as needed for muscle spasms. 05/06/22   [provider]  diclofenac (VOLTAREN) 0.1 % ophthalmic solution Place 1 drop into both eyes in the morning, at noon, and at bedtime.    [provider]  dicyclomine (BENTYL) 10 MG capsule Take 1 capsule (10 mg total) by mouth 3 (three) times daily as needed for spasms. 01/17/23   Carlan, Chelsea L, NP  famotidine (PEPCID) 40 MG tablet Take 40 mg by mouth at bedtime.    [provider]  furosemide (LASIX) 20 MG tablet Take 20 mg by mouth 2 (two) times daily. 10/23/20   [provider]  hydrocortisone (ANUSOL-HC) 2.5 % rectal cream Place 1 Application rectally 2 (two) times daily. 01/17/23   Gabriel Rung, NP  levothyroxine (SYNTHROID) 112 MCG tablet Take 112 mcg by mouth daily before breakfast. 09/22/20   [provider]  Multiple Vitamin (MULTIVITAMIN PO) Take 2 tablets by mouth daily. ADEK vitamin chewable    [provider]  Nutritional Supplements (BOOST  MAX 30G PROTEIN) LIQD Take 1 Bottle by mouth in the morning and at bedtime. 01/17/23   Carlan, Chelsea L, NP  olopatadine (PATANOL) 0.1 % ophthalmic solution Place 1 drop into both eyes 2 (two) times daily.    [provider]  ondansetron (ZOFRAN) 4 MG tablet TAKE 1 TABLET BY MOUTH EVERY 8 HOURS AS NEEDED FOR NAUSEA OR VOMITING 01/10/23   Carlan, Chelsea L, NP  oxyCODONE-acetaminophen (PERCOCET) 7.5-325 MG tablet Take 1 tablet by mouth every 4 (four) hours as needed for severe pain. 08/06/22 08/06/23  Nicholes Stairs, MD  pantoprazole  (PROTONIX) 40 MG tablet Take 40 mg by mouth daily. 10/23/20   [provider]  PRESCRIPTION MEDICATION Injection in both eyes every 10 weeks for macular deg    [provider]  spironolactone (ALDACTONE) 25 MG tablet Take 25 mg by mouth daily. 11/01/19   [provider]      Allergies    Cinnamon, Hydrocodone, Naproxen, Other, Oxycodone, Feldene [piroxicam], Lyrica [pregabalin], Nsaids, Phenergan [promethazine hcl], Sulfa antibiotics, Latex, and Tape    Review of Systems   Review of Systems  Eyes:  Negative for visual disturbance.  Respiratory:  Negative for shortness of breath.   Cardiovascular:  Negative for chest pain.  Gastrointestinal:  Negative for abdominal pain.  Skin:  Positive for wound.  Neurological:  Positive for headaches.    Physical Exam Updated Vital Signs BP (!) 162/77   Pulse (!) 59   Temp 97.8 F (36.6 C) (Oral)   Resp 18   SpO2 98%  Physical Exam Vitals and nursing note reviewed.  Constitutional:      General: She is not in acute distress.    Appearance: Normal appearance. She is well-developed.  HENT:     Head: Normocephalic and atraumatic.  Eyes:     Conjunctiva/sclera: Conjunctivae normal.  Neck:     Comments: Patient in cervical collar trach midline Cardiovascular:     Rate and Rhythm: Normal rate and regular rhythm.     Heart sounds: No murmur heard. Pulmonary:     Effort: Pulmonary effort is normal. No respiratory distress.     Breath sounds: Normal breath sounds.  Abdominal:     Palpations: Abdomen is soft.     Tenderness: There is no abdominal tenderness. There is no guarding or rebound.  Genitourinary:    Comments: Rectal exam done with nurse as chaperone.  She had some external hemorrhoids.  There was no stool in vault but she was trace positive. Musculoskeletal:        General: Tenderness present.     Comments: She has diffuse tenderness about her right shoulder proximal humerus.  Right wrist is also tender.   Left upper extremity full range of motion without any pain or limitations.  Right lower extremity tender about her knee.  Well-healed surgical scar.  Left lower extremity tender at knee and ankle.  Skin:    General: Skin is warm and dry.     Capillary Refill: Capillary refill takes less than 2 seconds.  Neurological:     General: No focal deficit present.     Mental Status: She is alert.     ED Results / Procedures / Treatments   Labs (all labs ordered are listed, but only abnormal results are displayed) Labs Reviewed - No data to display  EKG None  Radiology No results found.  Procedures Procedures    Medications Ordered in ED Medications  pantoprazole (PROTONIX) injection 40 mg (40 mg  Intravenous Given 02/11/23 0131)  acetaminophen (TYLENOL) tablet 650 mg (650 mg Oral Given 02/11/23 0930)    Or  acetaminophen (TYLENOL) suppository 650 mg ( Rectal See Alternative 02/11/23 0930)  ondansetron (ZOFRAN) tablet 4 mg ( Oral See Alternative 02/11/23 0840)    Or  ondansetron (ZOFRAN) injection 4 mg (4 mg Intravenous Given 02/11/23 0840)  atorvastatin (LIPITOR) tablet 10 mg (10 mg Oral Given 02/11/23 0931)  famotidine (PEPCID) tablet 40 mg (40 mg Oral Given 02/11/23 0930)  albuterol (VENTOLIN HFA) 108 (90 Base) MCG/ACT inhaler 2 puff (has no administration in time range)  fluticasone furoate-vilanterol (BREO ELLIPTA) 200-25 MCG/ACT 1 puff (1 puff Inhalation Not Given 02/11/23 0918)  levothyroxine (SYNTHROID) tablet 112 mcg (112 mcg Oral Given 02/11/23 0546)  morphine (PF) 2 MG/ML injection 2 mg (2 mg Intravenous Given 02/11/23 1221)  pneumococcal 20-valent conjugate vaccine (PREVNAR 20) injection 0.5 mL (has no administration in time range)  Tdap (BOOSTRIX) injection 0.5 mL (0.5 mLs Intramuscular Given 02/10/23 1738)  fentaNYL (SUBLIMAZE) injection 50 mcg (50 mcg Intravenous Given 02/10/23 1748)  morphine (PF) 4 MG/ML injection 4 mg (4 mg Intravenous Given 02/10/23 2010)  ondansetron (ZOFRAN)  injection 4 mg (4 mg Intravenous Given 02/10/23 2041)  morphine (PF) 4 MG/ML injection 4 mg (4 mg Intravenous Given 02/10/23 2319)  hydrOXYzine (ATARAX) tablet 25 mg (25 mg Oral Given 02/11/23 0144)  potassium chloride SA (KLOR-CON M) CR tablet 40 mEq (40 mEq Oral Given 02/11/23 [id number])    ED Course/ Medical Decision Making/ A&P Clinical Course as of 02/10/23 2228  Thu Feb 10, 2023  1940 Discussed with Dr. Aline Brochure orthopedics who said he would see the patient in the morning. [MB]    Clinical Course User Index [MB] Hayden Rasmussen, MD                             Medical Decision Making Amount and/or Complexity of Data Reviewed Labs: ordered. Radiology: ordered.  Risk Prescription drug management. Decision regarding hospitalization.   This patient complains of fall facial pain and severe right shoulder pain bilateral knee pain.; this involves an extensive number of treatment Options and is a complaint that carries with it a high risk of complications and morbidity. The differential includes fracture, contusion, dislocation  I ordered, reviewed and interpreted labs, which included CBC with normal white count, hemoglobin lower than priors, low platelets stable, chemistries with normal renal function, INR normal I ordered medication IV fluids IV pain medication nausea medication and reviewed PMP when indicated. I ordered imaging studies which included CT head face and spine, x-rays of shoulder, CT of shoulder, x-rays of knees and I independently    visualized and interpreted imaging which showed nasal fractures, proximal humerus fracture on the right, patella fracture on left Additional history obtained from EMS and patient's spouse Previous records obtained and reviewed in epic including recent ED visits I consulted orthopedics Dr. Aline Brochure and [facility] hospitalist Dr. Josephine Cables and discussed lab and imaging findings and discussed disposition.  Cardiac monitoring reviewed, normal sinus  rhythm Social determinants considered, no significant barriers Critical Interventions: None  After the interventions stated above, I reevaluated the patient and found patient to be in significant pain and unable to be discharged secondary to same.  She will need at least a knee immobilizer and nonweightbearing right upper extremity and so I do not think discharge is appropriate at this time. Admission and further testing considered, will review with hospitalist  regarding admission to the hospital for further workup including possible investigation of new anemia.  Dr. Aline Brochure orthopedics will evaluate tomorrow to make further recommendations as far as weightbearing status and any other orthopedic interventions.  Patient in agreement with plan for admission         Final Clinical Impression(s) / ED Diagnoses Final diagnoses:  Fall, initial encounter  Injury of head, initial encounter  Closed fracture of nasal bone, initial encounter  Closed fracture of proximal end of right humerus, unspecified fracture morphology, initial encounter  Closed displaced fracture of left patella, unspecified fracture morphology, initial encounter    Rx / DC Orders ED Discharge Orders     None         Hayden Rasmussen, MD 02/11/23 1415

## 2023-02-10 NOTE — Consult Note (Signed)
Please see consultation dictation on another note

## 2023-02-10 NOTE — ED Triage Notes (Signed)
Pt bib ems after mechanical fall. Pt with R shoulder pain, arrives in sling and ccollar. Facial abrasions noted, bleeding controlled. Pt also complains of L leg pain and R knee pain.

## 2023-02-11 ENCOUNTER — Other Ambulatory Visit: Payer: Self-pay

## 2023-02-11 ENCOUNTER — Inpatient Hospital Stay (HOSPITAL_COMMUNITY): Payer: 59

## 2023-02-11 DIAGNOSIS — Z96651 Presence of right artificial knee joint: Secondary | ICD-10-CM | POA: Diagnosis present

## 2023-02-11 DIAGNOSIS — K921 Melena: Secondary | ICD-10-CM

## 2023-02-11 DIAGNOSIS — K219 Gastro-esophageal reflux disease without esophagitis: Secondary | ICD-10-CM | POA: Diagnosis not present

## 2023-02-11 DIAGNOSIS — M810 Age-related osteoporosis without current pathological fracture: Secondary | ICD-10-CM | POA: Diagnosis not present

## 2023-02-11 DIAGNOSIS — K649 Unspecified hemorrhoids: Secondary | ICD-10-CM | POA: Diagnosis not present

## 2023-02-11 DIAGNOSIS — S82032A Displaced transverse fracture of left patella, initial encounter for closed fracture: Secondary | ICD-10-CM | POA: Diagnosis present

## 2023-02-11 DIAGNOSIS — S42211A Unspecified displaced fracture of surgical neck of right humerus, initial encounter for closed fracture: Secondary | ICD-10-CM | POA: Diagnosis present

## 2023-02-11 DIAGNOSIS — K922 Gastrointestinal hemorrhage, unspecified: Secondary | ICD-10-CM | POA: Diagnosis not present

## 2023-02-11 DIAGNOSIS — E782 Mixed hyperlipidemia: Secondary | ICD-10-CM | POA: Diagnosis not present

## 2023-02-11 DIAGNOSIS — E039 Hypothyroidism, unspecified: Secondary | ICD-10-CM | POA: Diagnosis not present

## 2023-02-11 DIAGNOSIS — Z8719 Personal history of other diseases of the digestive system: Secondary | ICD-10-CM | POA: Diagnosis not present

## 2023-02-11 DIAGNOSIS — W19XXXA Unspecified fall, initial encounter: Secondary | ICD-10-CM | POA: Insufficient documentation

## 2023-02-11 DIAGNOSIS — S022XXA Fracture of nasal bones, initial encounter for closed fracture: Secondary | ICD-10-CM | POA: Diagnosis present

## 2023-02-11 DIAGNOSIS — D649 Anemia, unspecified: Secondary | ICD-10-CM

## 2023-02-11 DIAGNOSIS — Y92009 Unspecified place in unspecified non-institutional (private) residence as the place of occurrence of the external cause: Secondary | ICD-10-CM | POA: Diagnosis not present

## 2023-02-11 DIAGNOSIS — I2510 Atherosclerotic heart disease of native coronary artery without angina pectoris: Secondary | ICD-10-CM | POA: Diagnosis not present

## 2023-02-11 DIAGNOSIS — E119 Type 2 diabetes mellitus without complications: Secondary | ICD-10-CM | POA: Diagnosis not present

## 2023-02-11 DIAGNOSIS — Z23 Encounter for immunization: Secondary | ICD-10-CM | POA: Diagnosis not present

## 2023-02-11 DIAGNOSIS — Z9181 History of falling: Secondary | ICD-10-CM | POA: Diagnosis not present

## 2023-02-11 DIAGNOSIS — H3530 Unspecified macular degeneration: Secondary | ICD-10-CM | POA: Diagnosis not present

## 2023-02-11 DIAGNOSIS — M19042 Primary osteoarthritis, left hand: Secondary | ICD-10-CM | POA: Diagnosis not present

## 2023-02-11 DIAGNOSIS — Z8249 Family history of ischemic heart disease and other diseases of the circulatory system: Secondary | ICD-10-CM | POA: Diagnosis not present

## 2023-02-11 DIAGNOSIS — K7581 Nonalcoholic steatohepatitis (NASH): Secondary | ICD-10-CM

## 2023-02-11 DIAGNOSIS — H04123 Dry eye syndrome of bilateral lacrimal glands: Secondary | ICD-10-CM | POA: Diagnosis not present

## 2023-02-11 DIAGNOSIS — S42201A Unspecified fracture of upper end of right humerus, initial encounter for closed fracture: Secondary | ICD-10-CM | POA: Diagnosis not present

## 2023-02-11 DIAGNOSIS — S022XXD Fracture of nasal bones, subsequent encounter for fracture with routine healing: Secondary | ICD-10-CM | POA: Diagnosis not present

## 2023-02-11 DIAGNOSIS — R2681 Unsteadiness on feet: Secondary | ICD-10-CM | POA: Diagnosis not present

## 2023-02-11 DIAGNOSIS — M19041 Primary osteoarthritis, right hand: Secondary | ICD-10-CM | POA: Diagnosis not present

## 2023-02-11 DIAGNOSIS — S82002A Unspecified fracture of left patella, initial encounter for closed fracture: Secondary | ICD-10-CM | POA: Insufficient documentation

## 2023-02-11 DIAGNOSIS — W19XXXD Unspecified fall, subsequent encounter: Secondary | ICD-10-CM | POA: Diagnosis not present

## 2023-02-11 DIAGNOSIS — J449 Chronic obstructive pulmonary disease, unspecified: Secondary | ICD-10-CM | POA: Diagnosis not present

## 2023-02-11 DIAGNOSIS — S42294A Other nondisplaced fracture of upper end of right humerus, initial encounter for closed fracture: Secondary | ICD-10-CM | POA: Diagnosis not present

## 2023-02-11 DIAGNOSIS — K449 Diaphragmatic hernia without obstruction or gangrene: Secondary | ICD-10-CM | POA: Diagnosis not present

## 2023-02-11 DIAGNOSIS — M25532 Pain in left wrist: Secondary | ICD-10-CM | POA: Diagnosis not present

## 2023-02-11 DIAGNOSIS — S82032D Displaced transverse fracture of left patella, subsequent encounter for closed fracture with routine healing: Secondary | ICD-10-CM | POA: Diagnosis not present

## 2023-02-11 DIAGNOSIS — M6281 Muscle weakness (generalized): Secondary | ICD-10-CM | POA: Diagnosis not present

## 2023-02-11 DIAGNOSIS — M19072 Primary osteoarthritis, left ankle and foot: Secondary | ICD-10-CM | POA: Diagnosis not present

## 2023-02-11 DIAGNOSIS — F32A Depression, unspecified: Secondary | ICD-10-CM | POA: Diagnosis present

## 2023-02-11 DIAGNOSIS — D696 Thrombocytopenia, unspecified: Secondary | ICD-10-CM | POA: Insufficient documentation

## 2023-02-11 DIAGNOSIS — I5032 Chronic diastolic (congestive) heart failure: Secondary | ICD-10-CM | POA: Diagnosis present

## 2023-02-11 DIAGNOSIS — D539 Nutritional anemia, unspecified: Secondary | ICD-10-CM | POA: Diagnosis present

## 2023-02-11 DIAGNOSIS — S82035A Nondisplaced transverse fracture of left patella, initial encounter for closed fracture: Secondary | ICD-10-CM | POA: Diagnosis not present

## 2023-02-11 DIAGNOSIS — I471 Supraventricular tachycardia, unspecified: Secondary | ICD-10-CM | POA: Diagnosis not present

## 2023-02-11 DIAGNOSIS — I272 Pulmonary hypertension, unspecified: Secondary | ICD-10-CM | POA: Diagnosis present

## 2023-02-11 DIAGNOSIS — M19071 Primary osteoarthritis, right ankle and foot: Secondary | ICD-10-CM | POA: Diagnosis not present

## 2023-02-11 DIAGNOSIS — I110 Hypertensive heart disease with heart failure: Secondary | ICD-10-CM | POA: Diagnosis present

## 2023-02-11 DIAGNOSIS — E876 Hypokalemia: Secondary | ICD-10-CM | POA: Diagnosis not present

## 2023-02-11 DIAGNOSIS — R52 Pain, unspecified: Secondary | ICD-10-CM | POA: Diagnosis not present

## 2023-02-11 DIAGNOSIS — M797 Fibromyalgia: Secondary | ICD-10-CM | POA: Diagnosis present

## 2023-02-11 DIAGNOSIS — J4489 Other specified chronic obstructive pulmonary disease: Secondary | ICD-10-CM | POA: Diagnosis present

## 2023-02-11 DIAGNOSIS — R112 Nausea with vomiting, unspecified: Secondary | ICD-10-CM

## 2023-02-11 DIAGNOSIS — R404 Transient alteration of awareness: Secondary | ICD-10-CM | POA: Diagnosis not present

## 2023-02-11 DIAGNOSIS — S42201D Unspecified fracture of upper end of right humerus, subsequent encounter for fracture with routine healing: Secondary | ICD-10-CM | POA: Diagnosis not present

## 2023-02-11 DIAGNOSIS — K7460 Unspecified cirrhosis of liver: Secondary | ICD-10-CM | POA: Diagnosis not present

## 2023-02-11 DIAGNOSIS — G629 Polyneuropathy, unspecified: Secondary | ICD-10-CM | POA: Diagnosis not present

## 2023-02-11 DIAGNOSIS — W010XXA Fall on same level from slipping, tripping and stumbling without subsequent striking against object, initial encounter: Secondary | ICD-10-CM | POA: Diagnosis present

## 2023-02-11 DIAGNOSIS — Z7401 Bed confinement status: Secondary | ICD-10-CM | POA: Diagnosis not present

## 2023-02-11 LAB — COMPREHENSIVE METABOLIC PANEL
ALT: 28 U/L (ref 0–44)
AST: 49 U/L — ABNORMAL HIGH (ref 15–41)
Albumin: 3.2 g/dL — ABNORMAL LOW (ref 3.5–5.0)
Alkaline Phosphatase: 99 U/L (ref 38–126)
Anion gap: 7 (ref 5–15)
BUN: 18 mg/dL (ref 8–23)
CO2: 23 mmol/L (ref 22–32)
Calcium: 8.1 mg/dL — ABNORMAL LOW (ref 8.9–10.3)
Chloride: 106 mmol/L (ref 98–111)
Creatinine, Ser: 1.06 mg/dL — ABNORMAL HIGH (ref 0.44–1.00)
GFR, Estimated: 58 mL/min — ABNORMAL LOW (ref >60)
Glucose, Bld: 85 mg/dL (ref 70–99)
Potassium: 3.4 mmol/L — ABNORMAL LOW (ref 3.5–5.1)
Sodium: 136 mmol/L (ref 135–145)
Total Bilirubin: 2.3 mg/dL — ABNORMAL HIGH (ref 0.3–1.2)
Total Protein: 5.5 g/dL — ABNORMAL LOW (ref 6.5–8.1)

## 2023-02-11 LAB — CBC
HCT: 30.4 % — ABNORMAL LOW (ref 36.0–46.0)
Hemoglobin: 10.2 g/dL — ABNORMAL LOW (ref 12.0–15.0)
MCH: 33.8 pg (ref 26.0–34.0)
MCHC: 33.6 g/dL (ref 30.0–36.0)
MCV: 100.7 fL — ABNORMAL HIGH (ref 80.0–100.0)
Platelets: 106 10*3/uL — ABNORMAL LOW (ref 150–400)
RBC: 3.02 MIL/uL — ABNORMAL LOW (ref 3.87–5.11)
RDW: 13.6 % (ref 11.5–15.5)
WBC: 4.7 10*3/uL (ref 4.0–10.5)
nRBC: 0 % (ref 0.0–0.2)

## 2023-02-11 LAB — IRON AND TIBC
Iron: 36 ug/dL (ref 28–170)
Saturation Ratios: 13 % (ref 10.4–31.8)
TIBC: 280 ug/dL (ref 250–450)
UIBC: 244 ug/dL

## 2023-02-11 LAB — HIV ANTIBODY (ROUTINE TESTING W REFLEX): HIV Screen 4th Generation wRfx: NONREACTIVE

## 2023-02-11 LAB — VITAMIN B12: Vitamin B-12: 398 pg/mL (ref 180–914)

## 2023-02-11 LAB — GLUCOSE, CAPILLARY: Glucose-Capillary: 80 mg/dL (ref 70–99)

## 2023-02-11 LAB — MAGNESIUM: Magnesium: 1.8 mg/dL (ref 1.7–2.4)

## 2023-02-11 LAB — FOLATE: Folate: 20.7 ng/mL (ref >5.9)

## 2023-02-11 LAB — PHOSPHORUS: Phosphorus: 3.9 mg/dL (ref 2.5–4.6)

## 2023-02-11 LAB — FERRITIN: Ferritin: 177 ng/mL (ref 11–307)

## 2023-02-11 MED ORDER — FAMOTIDINE 20 MG PO TABS
40.0000 mg | ORAL_TABLET | Freq: Every day | ORAL | Status: DC
  Administered 2023-02-11 – 2023-02-15 (×5): 40 mg via ORAL
  Filled 2023-02-11 (×5): qty 2

## 2023-02-11 MED ORDER — ACETAMINOPHEN 325 MG PO TABS
650.0000 mg | ORAL_TABLET | Freq: Four times a day (QID) | ORAL | Status: DC | PRN
  Administered 2023-02-11 – 2023-02-13 (×5): 650 mg via ORAL
  Filled 2023-02-11 (×5): qty 2

## 2023-02-11 MED ORDER — ONDANSETRON HCL 4 MG/2ML IJ SOLN
4.0000 mg | Freq: Four times a day (QID) | INTRAMUSCULAR | Status: DC | PRN
  Administered 2023-02-11 – 2023-02-13 (×5): 4 mg via INTRAVENOUS
  Filled 2023-02-11 (×5): qty 2

## 2023-02-11 MED ORDER — POTASSIUM CHLORIDE CRYS ER 20 MEQ PO TBCR
40.0000 meq | EXTENDED_RELEASE_TABLET | Freq: Once | ORAL | Status: AC
  Administered 2023-02-11: 40 meq via ORAL
  Filled 2023-02-11: qty 2

## 2023-02-11 MED ORDER — ONDANSETRON HCL 4 MG PO TABS: 4.0000 mg | ORAL_TABLET | Freq: Four times a day (QID) | ORAL | Status: DC | PRN

## 2023-02-11 MED ORDER — MORPHINE SULFATE (PF) 2 MG/ML IV SOLN
2.0000 mg | INTRAVENOUS | Status: DC | PRN
  Administered 2023-02-11 (×2): 2 mg via INTRAVENOUS
  Filled 2023-02-11 (×2): qty 1

## 2023-02-11 MED ORDER — PNEUMOCOCCAL 20-VAL CONJ VACC 0.5 ML IM SUSY
0.5000 mL | PREFILLED_SYRINGE | INTRAMUSCULAR | Status: AC
  Administered 2023-02-12: 0.5 mL via INTRAMUSCULAR
  Filled 2023-02-11: qty 0.5

## 2023-02-11 MED ORDER — ATORVASTATIN CALCIUM 10 MG PO TABS
10.0000 mg | ORAL_TABLET | Freq: Every day | ORAL | Status: DC
  Administered 2023-02-11 – 2023-02-15 (×5): 10 mg via ORAL
  Filled 2023-02-11 (×5): qty 1

## 2023-02-11 MED ORDER — MORPHINE SULFATE (PF) 2 MG/ML IV SOLN
2.0000 mg | INTRAVENOUS | Status: DC | PRN
  Administered 2023-02-11 – 2023-02-15 (×13): 2 mg via INTRAVENOUS
  Filled 2023-02-11 (×13): qty 1

## 2023-02-11 MED ORDER — FLUTICASONE FUROATE-VILANTEROL 200-25 MCG/ACT IN AEPB
1.0000 | INHALATION_SPRAY | Freq: Every day | RESPIRATORY_TRACT | Status: DC
  Administered 2023-02-12 – 2023-02-15 (×4): 1 via RESPIRATORY_TRACT
  Filled 2023-02-11: qty 28

## 2023-02-11 MED ORDER — PANTOPRAZOLE SODIUM 40 MG IV SOLR
40.0000 mg | INTRAVENOUS | Status: DC
  Administered 2023-02-11 – 2023-02-14 (×5): 40 mg via INTRAVENOUS
  Filled 2023-02-11 (×5): qty 10

## 2023-02-11 MED ORDER — ACETAMINOPHEN 650 MG RE SUPP: 650.0000 mg | Freq: Four times a day (QID) | RECTAL | Status: DC | PRN

## 2023-02-11 MED ORDER — LEVOTHYROXINE SODIUM 112 MCG PO TABS
112.0000 ug | ORAL_TABLET | Freq: Every day | ORAL | Status: DC
  Administered 2023-02-11 – 2023-02-15 (×5): 112 ug via ORAL
  Filled 2023-02-11 (×5): qty 1

## 2023-02-11 MED ORDER — HYDROXYZINE HCL 25 MG PO TABS
25.0000 mg | ORAL_TABLET | Freq: Once | ORAL | Status: AC | PRN
  Administered 2023-02-11: 25 mg via ORAL
  Filled 2023-02-11: qty 1

## 2023-02-11 MED ORDER — LEVOTHYROXINE SODIUM 112 MCG PO TABS: 112.0000 ug | ORAL_TABLET | Freq: Every day | ORAL | Status: DC

## 2023-02-11 MED ORDER — ALBUTEROL SULFATE HFA 108 (90 BASE) MCG/ACT IN AERS: 2.0000 | INHALATION_SPRAY | Freq: Four times a day (QID) | RESPIRATORY_TRACT | Status: DC | PRN

## 2023-02-11 NOTE — ED Notes (Signed)
Pt had episode of emesis x 1 of undigested food, causing HR to go up above 130.  Pt currently resting, HR back in 60s-70s

## 2023-02-11 NOTE — Consult Note (Signed)
Gastroenterology Consult   Referring Provider: No ref. provider found Primary Care Physician:  Glenda Chroman, MD Primary Gastroenterologist:  Dr. Jenetta Downer  Patient ID: Kimberly [name]; [id number]; [birth year]   Admit date: 02/10/2023  LOS: 0 days   Date of Consultation: 02/11/2023  Reason for Consultation:  concern for GI bleed  History of Present Illness   Kimberly [name] is a 67 y.o. year old female with history of compensated NASH cirrhosis, CHF, CAD, depression, diabetes, GERD, HLD, COPD, IBS-D, hypothyroidism, HTN, OSA, stroke, seizures, obesity s/p duodenal switch, macular degeneration who presented to the ED after sustaining a fall and presented with nosebleed and pain to her face, head, neck, bilateral knees, and right arm. GI consulted due to down trending hemoglobin with heme positive stool.   ED Course: Vital signs with mild intermittent bradycardia, BP 162/77, otherwise stable. Labs with bicarb 20, creatinine 1.02, WBC 5.4, hemoglobin 10.5, MCV 100.3, platelets 106 -Multiple imaging studies including right shoulder x-ray with osteopenia, probable femoral neck fracture, possible fracture of the scapula. -Left ankle x-ray with soft tissue swelling and degenerative changes without osseous findings -Left knee x-ray with slightly displaced mid body transverse patella fracture and large knee joint effusion -CT of right upper extremity with comminuted nondisplaced fracture of right humeral neck -CT head, maxillofacial, and cervical spine with nasal fractures, concha bullosa, no fracture of cervical spine, DDD, left maxillary antrum cyst or polyp She received pain medication as well as Zofran and Tdap vaccine.  Awaiting orthopedic consultation.  Consult:  Patient reports she had not had any blood in her stools or any melena prior to her fall. She states she tripped and fell and hit her faced and mostly fell on her right side and believes she did swallow some blood and lost  consciousness briefly. Reports chronic right sided abdominal pain. No dizziness and no syncope. The last few weeks she reports intermittent diarrhea and has been taking dicyclomine for her abdominal cramps. Has been tacking spironolactone and lasix at home. No abdominal swelling. Not confused but feels as though at times she has seen shadows for a second and then nothing be there a few seconds later. Still having pruritus and reports she still has a rash to her bilateral elbows and has had some bruising to that area prior to her fall. Has had some mild increase in the swelling of her lower extremities. Still having 10-15 small BM per day and having stools after eating. Also having intermittent nausea and vomiting without hematemesis and no coffee ground emesis. Unable to afford boost or the protein supplements and insurance would not cover. Reports it is hard for her to get food affordably. She states she is lactose intolerant to milk but able to cheese and only gets $23 dollars in food stamps a month. Tries to follow a low sodium diet.   Patient recently seen in the office 01/17/2023 for cirrhosis follow-up.  Was having 8-10 loose stools per day.  Denied any rectal bleeding but did report some irritation and itching from her hemorrhoids.  Has been taking Imodium this did not seem to help.  No longer taking Levsin.  Having occasional nausea but taking daily probiotic.  Also noticed some new onset pruritus for couple of months possibly related to changing her shampoo but then her pruritus worsened.  Feeling more fatigued recently.  GERD controlled on Protonix and famotidine.  Requesting to stop famotidine and just try PPI.  Maintained on spironolactone 25 mg daily Lasix 40 mg  daily.  MELD labs, INR, and AFP ordered as well as right upper quadrant ultrasound and scheduled for EGD and colonoscopy in July 2024.  Also checking celiac panel and started on cholestyramine 4 g if celiac panel negative.  Given prescription  for dicyclomine to use 2 mg 3 times daily for abdominal pain.  Celiac panel negative.  RUQ [place] ordered.  RUQ [place] 01/26/23: -hepatic steatosis -cholecystectomy - distal CBD borderline measuring 6.36mm -given elevated bili she was recommended to have MRI/MRCP  Last Colonoscopy 08/03/17: - Diverticulosis at the hepatic flexure. - Internal hemorrhoids. - No specimens collected  Last Endoscopy 06/16/21: - Small hiatal hernia. - Normal stomach. - Widely patent duodenal switch, characterized by healthy appearing mucosa was found.(Peptic duodenitis, no other alterations)   Past Medical History:  Diagnosis Date   Anemia    PMH: as a child   Arthritis    Asthma    CAD (coronary artery disease)    nonobstructive by cath, 6/08 (false positive Cardiolite) normal stress echo, 7/11   CHF (congestive heart failure) (HCC)    Chronic back pain    Chronic bronchitis (HCC)    Cirrhosis ([place])    Complication of anesthesia    had an asthma attack when woke up from procedure   Complication of anesthesia    asthma attack with one surgery   COPD (chronic obstructive pulmonary disease) ([place])    Degenerative joint disease    Depression    Diabetes mellitus without complication ([place])    DJD (degenerative joint disease)    Fatty liver    Fibromyalgia    GERD (gastroesophageal reflux disease)    Glaucoma    Headache(784.0)    Heart failure, diastolic, chronic (HCC)    Heart murmur    PMH:As a child only   Hernia of abdominal cavity    "upper and lower hernia"   History of hiatal hernia    HTN (hypertension)    Hypertension    Hypothyroidism    IBS (irritable bowel syndrome)    Morbid obesity ([place])    Neuropathy    associated with diabetes   Obstructive sleep apnea    Pancreatitis    Pinched nerve in neck    Pneumonia    as a child   PONV (postoperative nausea and vomiting)    Pulmonary hypertension (HCC)    Rheumatic fever    PMH: as a child   Seizures ([place])    PMH: only as a child    Seizures ([place])    in childhood   Shortness of breath    Sleep apnea    Spinal stenosis    Stroke ([place])    12/09/2013    Past Surgical History:  Procedure Laterality Date   ABDOMINAL HYSTERECTOMY     ABDOMINAL HYSTERECTOMY     partial hysterectomy   APPENDECTOMY     BACK SURGERY     BACK SURGERY     disc surgery with complications and damage to right side   BARIATRIC SURGERY     DS in June 2020   BILATERAL KNEE ARTHROSCOPY  2000, 2009   BIOPSY  06/16/2021   Procedure: BIOPSY;  Surgeon: Harvel Quale, MD;  [facility];  Service: Gastroenterology;;   BREAST CYST EXCISION Left    CARDIAC CATHETERIZATION     2009   CATARACT EXTRACTION W/ INTRAOCULAR LENS  IMPLANT, BILATERAL     CHOLECYSTECTOMY  11/22/1992   CHOLECYSTECTOMY     COLONOSCOPY N/A 08/01/2013  Procedure: COLONOSCOPY;  Surgeon: Rogene Houston, MD;  [facility];  Service: Endoscopy;  Laterality: N/A;  930   COLONOSCOPY N/A 08/03/2017   Procedure: COLONOSCOPY;  Surgeon: Rogene Houston, MD;  [facility];  Service: Endoscopy;  Laterality: N/A;  12:00   CYST EXCISION     Left breast   ESOPHAGOGASTRODUODENOSCOPY (EGD) WITH PROPOFOL N/A 06/16/2021   Procedure: ESOPHAGOGASTRODUODENOSCOPY (EGD) WITH PROPOFOL;  Surgeon: Harvel Quale, MD;  [facility];  Service: Gastroenterology;  Laterality: N/A;  12:50   EXCISIONAL HEMORRHOIDECTOMY     EYE SURGERY  11/22/1965   EYE SURGERY     bilateral cataract removal with lens implants   EYE SURGERY     growth removed from right eye lid   EYE SURGERY     bilateral eye surgery age 59 t"to straighten eyes'   heel (other)  11/23/2003   HEEL SPUR SURGERY     Right heel - growth removal and rebuilding of heel   HEMORRHOID SURGERY     HERNIA REPAIR     KNEE ARTHROSCOPY Bilateral    LUMBAR LAMINECTOMY/DECOMPRESSION MICRODISCECTOMY Right 08/23/2014   Procedure: LUMBAR LAMINECTOMY/DECOMPRESSION MICRODISCECTOMY 1  LEVEL;  Surgeon: Charlie Pitter, MD;  Location: [facility];  Service: Neurosurgery;  Laterality: Right;  LUMBAR LAMINECTOMY/DECOMPRESSION MICRODISCECTOMY 1 LEVEL LUMBAR 2-3   ovaries removed  11/22/2001   PARTIAL HYSTERECTOMY  11/23/1979   SHOULDER ARTHROSCOPY WITH DISTAL CLAVICLE RESECTION Left 08/06/2022   Procedure: SHOULDER ARTHROSCOPY WITH DISTAL CLAVICLE RESECTION;  Surgeon: Nicholes Stairs, MD;  Location: [facility];  Service: Orthopedics;  Laterality: Left;   SHOULDER ARTHROSCOPY WITH ROTATOR CUFF REPAIR Left 08/06/2022   Procedure: SHOULDER ARTHROSCOPY WITH ROTATOR CUFF REPAIR;  Surgeon: Nicholes Stairs, MD;  Location: [facility];  Service: Orthopedics;  Laterality: Left;   SHOULDER ARTHROSCOPY WITH SUBACROMIAL DECOMPRESSION Left 08/06/2022   Procedure: SHOULDER ARTHROSCOPY WITH SUBACROMIAL [place] EXTENSIVE DEBRIDEMENT;  Surgeon: Nicholes Stairs, MD;  Location: [facility];  Service: Orthopedics;  Laterality: Left;   SPHINCTEROTOMY     spinahatomy  11/22/1990   TOTAL KNEE ARTHROPLASTY Right 11/05/2020   Procedure: RIGHT TOTAL KNEE ARTHROPLASTY;  Surgeon: Marybelle Killings, MD;  Location: [facility];  Service: Orthopedics;  Laterality: Right;   TUBAL LIGATION  11/22/1973   TUBAL LIGATION      Prior to Admission medications   Medication Sig Start Date End Date Taking? Authorizing Provider  albuterol (VENTOLIN HFA) 108 (90 Base) MCG/ACT inhaler Inhale 2 puffs into the lungs 4 (four) times daily as needed for shortness of breath. 09/22/20  Yes [provider]  atorvastatin (LIPITOR) 10 MG tablet Take 10 mg by mouth daily. 08/15/20  Yes [provider]  BREO ELLIPTA 200-25 MCG/INH AEPB Inhale 2 puffs into the lungs daily. 08/22/20  Yes [provider]  Carboxymethylcellulose Sodium (THERATEARS) 0.25 % SOLN Place 1 drop into both eyes in the morning, at noon, and at bedtime.   Yes [provider]  cyclobenzaprine (FLEXERIL) 5 MG tablet Take 5 mg by  mouth every 8 (eight) hours as needed for muscle spasms. 05/06/22  Yes [provider]  dicyclomine (BENTYL) 10 MG capsule Take 1 capsule (10 mg total) by mouth 3 (three) times daily as needed for spasms. 01/17/23  Yes Carlan, Chelsea L, NP  famotidine (PEPCID) 40 MG tablet Take 40 mg by mouth daily.   Yes [provider]  furosemide (LASIX) 20 MG tablet Take 20 mg by mouth 2 (  two) times daily. 10/23/20  Yes [provider]  hydrocortisone (ANUSOL-HC) 2.5 % rectal cream Place 1 Application rectally 2 (two) times daily. 01/17/23  Yes Carlan, Chelsea L, NP  hydrOXYzine (ATARAX) 25 MG tablet Take by mouth. 07/16/20  Yes [provider]  levothyroxine (SYNTHROID) 112 MCG tablet Take 112 mcg by mouth daily before breakfast. 09/22/20  Yes [provider]  Multiple Vitamin (MULTIVITAMIN PO) Take 2 tablets by mouth daily. ADEK vitamin chewable   Yes [provider]  Olopatadine HCl (PATADAY OP) Place 1 drop into both eyes in the morning and at bedtime.   Yes [provider]  ondansetron (ZOFRAN) 4 MG tablet TAKE 1 TABLET BY MOUTH EVERY 8 HOURS AS NEEDED FOR NAUSEA OR VOMITING 01/10/23  Yes Carlan, Chelsea L, NP  pantoprazole (PROTONIX) 40 MG tablet Take 40 mg by mouth daily. 10/23/20  Yes [provider]  PRESCRIPTION MEDICATION Injection in both eyes every 10 weeks for macular deg   Yes [provider]  spironolactone (ALDACTONE) 25 MG tablet Take 25 mg by mouth daily. 11/01/19  Yes [provider]  Calcium Carb-Cholecalciferol (CALCIUM 500+D PO) Take 1 tablet by mouth in the morning and at bedtime. Patient not taking: Reported on 01/17/2023    [provider]  Cholecalciferol (VITAMIN D) 50 MCG (2000 UT) tablet Take 2,000 Units by mouth daily. Patient not taking: Reported on 01/17/2023    [provider]  Cyanocobalamin (VITAMIN B-12) 5000 MCG SUBL Place 5,000 mcg under the tongue every other day. Patient  not taking: Reported on 01/17/2023    [provider]  oxyCODONE-acetaminophen (PERCOCET) 7.5-325 MG tablet Take 1 tablet by mouth every 4 (four) hours as needed for severe pain. Patient not taking: Reported on 02/10/2023 08/06/22 08/06/23  Nicholes Stairs, MD    Current Facility-Administered Medications  Medication Dose Route Frequency Provider Last Rate Last Admin   acetaminophen (TYLENOL) tablet 650 mg  650 mg Oral Q6H PRN Adefeso, Oladapo, DO   650 mg at 02/11/23 0930   Or   acetaminophen (TYLENOL) suppository 650 mg  650 mg Rectal Q6H PRN Adefeso, Oladapo, DO       albuterol (VENTOLIN HFA) 108 (90 Base) MCG/ACT inhaler 2 puff  2 puff Inhalation QID PRN Adefeso, Oladapo, DO       atorvastatin (LIPITOR) tablet 10 mg  10 mg Oral Daily Adefeso, Oladapo, DO   10 mg at 02/11/23 0931   famotidine (PEPCID) tablet 40 mg  40 mg Oral Daily Adefeso, Oladapo, DO   40 mg at 02/11/23 0930   fluticasone furoate-vilanterol (BREO ELLIPTA) 200-25 MCG/ACT 1 puff  1 puff Inhalation Daily Adefeso, Oladapo, DO       levothyroxine (SYNTHROID) tablet 112 mcg  112 mcg Oral Q0600 Adefeso, Oladapo, DO   112 mcg at 02/11/23 0546   morphine (PF) 2 MG/ML injection 2 mg  2 mg Intravenous Q2H PRN Adefeso, Oladapo, DO   2 mg at 02/11/23 0840   ondansetron (ZOFRAN) tablet 4 mg  4 mg Oral Q6H PRN Adefeso, Oladapo, DO       Or   ondansetron (ZOFRAN) injection 4 mg  4 mg Intravenous Q6H PRN Adefeso, Oladapo, DO   4 mg at 02/11/23 0840   pantoprazole (PROTONIX) injection 40 mg  40 mg Intravenous Q24H Adefeso, Oladapo, DO   40 mg at 02/11/23 0131   Current Outpatient Medications  Medication Sig Dispense Refill   albuterol (VENTOLIN HFA) 108 (90 Base) MCG/ACT inhaler Inhale 2 puffs  into the lungs 4 (four) times daily as needed for shortness of breath.     atorvastatin (LIPITOR) 10 MG tablet Take 10 mg by mouth daily.     BREO ELLIPTA 200-25 MCG/INH AEPB Inhale 2 puffs into the lungs daily.      Carboxymethylcellulose Sodium (THERATEARS) 0.25 % SOLN Place 1 drop into both eyes in the morning, at noon, and at bedtime.     cyclobenzaprine (FLEXERIL) 5 MG tablet Take 5 mg by mouth every 8 (eight) hours as needed for muscle spasms.     dicyclomine (BENTYL) 10 MG capsule Take 1 capsule (10 mg total) by mouth 3 (three) times daily as needed for spasms. 90 capsule 2   famotidine (PEPCID) 40 MG tablet Take 40 mg by mouth daily.     furosemide (LASIX) 20 MG tablet Take 20 mg by mouth 2 (two) times daily.     hydrocortisone (ANUSOL-HC) 2.5 % rectal cream Place 1 Application rectally 2 (two) times daily. 30 g 1   hydrOXYzine (ATARAX) 25 MG tablet Take by mouth.     levothyroxine (SYNTHROID) 112 MCG tablet Take 112 mcg by mouth daily before breakfast.     Multiple Vitamin (MULTIVITAMIN PO) Take 2 tablets by mouth daily. ADEK vitamin chewable     Olopatadine HCl (PATADAY OP) Place 1 drop into both eyes in the morning and at bedtime.     ondansetron (ZOFRAN) 4 MG tablet TAKE 1 TABLET BY MOUTH EVERY 8 HOURS AS NEEDED FOR NAUSEA OR VOMITING 30 tablet 1   pantoprazole (PROTONIX) 40 MG tablet Take 40 mg by mouth daily.     PRESCRIPTION MEDICATION Injection in both eyes every 10 weeks for macular deg     spironolactone (ALDACTONE) 25 MG tablet Take 25 mg by mouth daily.     Calcium Carb-Cholecalciferol (CALCIUM 500+D PO) Take 1 tablet by mouth in the morning and at bedtime. (Patient not taking: Reported on 01/17/2023)     Cholecalciferol (VITAMIN D) 50 MCG (2000 UT) tablet Take 2,000 Units by mouth daily. (Patient not taking: Reported on 01/17/2023)     Cyanocobalamin (VITAMIN B-12) 5000 MCG SUBL Place 5,000 mcg under the tongue every other day. (Patient not taking: Reported on 01/17/2023)     oxyCODONE-acetaminophen (PERCOCET) 7.5-325 MG tablet Take 1 tablet by mouth every 4 (four) hours as needed for severe pain. (Patient not taking: Reported on 02/10/2023) 20 tablet 0    Allergies as of 02/10/2023 - Review  Complete 02/10/2023  Allergen Reaction Noted   Cinnamon Anaphylaxis 09/29/2016   Hydrocodone Other (See Comments) 09/06/2014   Naproxen Anaphylaxis and Other (See Comments)    Other Shortness Of Breath and Other (See Comments) 08/20/2011   Oxycodone Other (See Comments) 09/06/2014   Feldene [piroxicam] Nausea Only 09/28/2016   Lyrica [pregabalin] Other (See Comments) 09/28/2016   Nsaids  10/22/2020   Phenergan [promethazine hcl] Other (See Comments) 08/04/2016   Sulfa antibiotics Other (See Comments) 04/06/2017   Latex Rash and Other (See Comments) 04/06/2017   Tape Rash and Other (See Comments) 08/14/2014    Family History  Problem Relation Age of Onset   Cancer Other    Heart failure Other    Colon cancer Brother     Social History   Socioeconomic History   Marital status: Married    Spouse name: Not on file   Number of children: Not on file   Years of education: Not on file   Highest education level: Not on file  Occupational History  Not on file  Tobacco Use   Smoking status: Never    Passive exposure: Never   Smokeless tobacco: Never  Vaping Use   Vaping Use: Never used  Substance and Sexual Activity   Alcohol use: No   Drug use: No   Sexual activity: Not on file  Other Topics Concern   Not on file  Social History Narrative   ** Merged History Encounter **       Regularly exercises. Full Time.    Social Determinants of Health   Financial Resource Strain: Not on file  Food Insecurity: Not on file  Transportation Needs: Not on file  Physical Activity: Not on file  Stress: Not on file  Social Connections: Not on file  Intimate Partner Violence: Not on file     Review of Systems   Gen: Denies any fever, chills, loss of appetite, change in weight or weight loss CV: Denies chest pain, heart palpitations, syncope, edema  Resp: Denies shortness of breath with rest, cough, wheezing, coughing up blood, and pleurisy. GI: see HPI GU : Denies urinary  burning, blood in urine, urinary frequency, and urinary incontinence. MS: + joint pain, swelling, and limited range of motion. Denies swelling, cramps, and atrophy.  Derm: + pruritus and rash. Denies dry skin, hives. Psych: Denies depression, anxiety, memory loss, hallucinations, and confusion. Heme: + bruising. + epistaxis Neuro:  + headache and dizziness. Denies any paresthesias, shaking  Physical Exam   Vital Signs in last 24 hours: Temp:  [97.8 F (36.6 C)-98.5 F (36.9 C)] 98.3 F (36.8 C) (03/22 0548) Pulse Rate:  [57-78] 68 (03/22 0548) Resp:  [16-18] 16 (03/22 0548) BP: (108-162)/(74-87) 117/74 (03/22 0548) SpO2:  [96 %-100 %] 96 % (03/22 0548) FiO2 (%):  [21 %] 21 % (03/22 0047) Last BM Date : 02/10/23  General:   Alert,  Well-developed, well-nourished, pleasant and cooperative in NAD Head:  Normocephalic and atraumatic. Eyes:  Sclera clear, no icterus.   Conjunctiva pink. Ears:  Normal auditory acuity. Mouth:  No deformity or lesions, dentition normal. Neck:  Supple; no masses Lungs:  Clear throughout to auscultation.   No wheezes, crackles, or rhonchi. No acute distress. Heart:  Regular rate and rhythm; no murmurs, clicks, rubs,  or gallops. Abdomen:  Soft, nontender and nondistended. No masses, hepatosplenomegaly or hernias noted. Normal bowel sounds, without guarding, and without rebound.   Rectal: deferred Msk:  left knee immobilizer in place. Right arm in sling. Bruising to left upper extremity. Extremities: No swelling to RLE, mild swelling to left knee. No swelling to left ankle. Neurologic:  Alert and  oriented x4. Skin:  Intact without significant lesions or rashes. Psych:  Alert and cooperative. Normal mood and affect.  Intake/Output from previous day: 03/21 0701 - 03/22 0700 In: 240 [P.O.:240] Out: -  Intake/Output this shift: No intake/output data recorded.   Labs/Studies   Recent Labs Recent Labs    02/10/23 2154 02/11/23 0523  WBC 5.4 4.7   HGB 10.5* 10.2*  HCT 30.8* 30.4*  PLT 106* 106*   BMET Recent Labs    02/10/23 2119 02/11/23 0523  NA 138 136  K 3.6 3.4*  CL 109 106  CO2 20* 23  GLUCOSE 97 85  BUN 18 18  CREATININE 1.02* 1.06*  CALCIUM 8.3* 8.1*   LFT Recent Labs    02/11/23 0523  PROT 5.5*  ALBUMIN 3.2*  AST 49*  ALT 28  ALKPHOS 99  BILITOT 2.3*   PT/INR Recent Labs  02/10/23 2234  LABPROT 14.2  INR 1.1   Hepatitis Panel No results for input(s): "HEPBSAG", "HCVAB", "HEPAIGM", "HEPBIGM" in the last 72 hours. C-Diff No results for input(s): "CDIFFTOX" in the last 72 hours.  Radiology/Studies CT Shoulder Right Wo Contrast  Result Date: 02/10/2023 CLINICAL DATA:  Trauma to the right shoulder. EXAM: CT OF THE UPPER RIGHT EXTREMITY WITHOUT CONTRAST TECHNIQUE: Multidetector CT imaging of the upper right extremity was performed according to the standard protocol. RADIATION DOSE REDUCTION: This exam was performed according to the departmental dose-optimization program which includes automated exposure control, adjustment of the mA and/or kV according to patient size and/or use of iterative reconstruction technique. COMPARISON:  Right shoulder radiograph dated 02/10/2023. FINDINGS: Bones/Joint/Cartilage There is comminuted nondisplaced fracture of the right humeral neck. No other acute fracture. No dislocation. The bones are osteopenic. Degenerative changes and spurring of the inferior bony glenoid. No joint effusion. Ligaments Suboptimally assessed by CT. Muscles and Tendons No acute findings. Soft tissues Soft tissue edema.  No fluid collection or hematoma. IMPRESSION: Comminuted nondisplaced fracture of the right humeral neck. Electronically Signed   By: Anner Crete M.D.   On: 02/10/2023 19:23   CT Head Wo Contrast  Result Date: 02/10/2023 CLINICAL DATA:  Trauma EXAM: CT HEAD WITHOUT CONTRAST CT MAXILLOFACIAL WITHOUT CONTRAST CT CERVICAL SPINE WITHOUT CONTRAST TECHNIQUE: Multidetector CT imaging  of the head, cervical spine, and maxillofacial structures were performed using the standard protocol without intravenous contrast. Multiplanar CT image reconstructions of the cervical spine and maxillofacial structures were also generated. RADIATION DOSE REDUCTION: This exam was performed according to the departmental dose-optimization program which includes automated exposure control, adjustment of the mA and/or kV according to patient size and/or use of iterative reconstruction technique. COMPARISON:  None Available. FINDINGS: CT HEAD FINDINGS Brain: There is periventricular white matter decreased attenuation consistent with small vessel ischemic changes. Gray-white differentiation is preserved. No acute intracranial hemorrhage, mass effect or shift. No hydrocephalus. Vascular: No hyperdense vessel or unexpected calcification. Skull: Normal. Negative for fracture or focal lesion. Other: None. CT MAXILLOFACIAL FINDINGS Osseous: Nasal septal deviation to the right. Bilateral nasal fractures displaced by few mm. Osseous structures are otherwise intact. Orbits: Negative. No traumatic or inflammatory finding. Sinuses: 3 cm left maxillary antrum cyst or polyp. Left-sided concha bullosa. No air-fluid levels or bony destructive processes. Soft tissues: Negative. CT CERVICAL SPINE FINDINGS Alignment: Normal. Skull base and vertebrae: No acute fracture. No primary bone lesion or focal pathologic process. Soft tissues and spinal canal: No prevertebral fluid or swelling. No visible canal hematoma. Disc levels: Disc space narrowing and marginal osteophyte formation C5-6 and C6-7. Osteoarthritis at C1-C2. Upper chest: Negative. IMPRESSION: 1. Periventricular white matter changes consistent with chronic small vessel ischemia. 2. No acute intracranial process identified. 3. Nasal fractures. 4. Concha bullosa. 5. Left maxillary antrum cyst or polyp. 6. Degenerative disc disease. 7. No traumatic abnormalities of the cervical spine.  Electronically Signed   By: Sammie Bench M.D.   On: 02/10/2023 17:37   CT Cervical Spine Wo Contrast  Result Date: 02/10/2023 CLINICAL DATA:  Trauma EXAM: CT HEAD WITHOUT CONTRAST CT MAXILLOFACIAL WITHOUT CONTRAST CT CERVICAL SPINE WITHOUT CONTRAST TECHNIQUE: Multidetector CT imaging of the head, cervical spine, and maxillofacial structures were performed using the standard protocol without intravenous contrast. Multiplanar CT image reconstructions of the cervical spine and maxillofacial structures were also generated. RADIATION DOSE REDUCTION: This exam was performed according to the departmental dose-optimization program which includes automated exposure control, adjustment of the mA and/or  kV according to patient size and/or use of iterative reconstruction technique. COMPARISON:  None Available. FINDINGS: CT HEAD FINDINGS Brain: There is periventricular white matter decreased attenuation consistent with small vessel ischemic changes. Gray-white differentiation is preserved. No acute intracranial hemorrhage, mass effect or shift. No hydrocephalus. Vascular: No hyperdense vessel or unexpected calcification. Skull: Normal. Negative for fracture or focal lesion. Other: None. CT MAXILLOFACIAL FINDINGS Osseous: Nasal septal deviation to the right. Bilateral nasal fractures displaced by few mm. Osseous structures are otherwise intact. Orbits: Negative. No traumatic or inflammatory finding. Sinuses: 3 cm left maxillary antrum cyst or polyp. Left-sided concha bullosa. No air-fluid levels or bony destructive processes. Soft tissues: Negative. CT CERVICAL SPINE FINDINGS Alignment: Normal. Skull base and vertebrae: No acute fracture. No primary bone lesion or focal pathologic process. Soft tissues and spinal canal: No prevertebral fluid or swelling. No visible canal hematoma. Disc levels: Disc space narrowing and marginal osteophyte formation C5-6 and C6-7. Osteoarthritis at C1-C2. Upper chest: Negative. IMPRESSION:  1. Periventricular white matter changes consistent with chronic small vessel ischemia. 2. No acute intracranial process identified. 3. Nasal fractures. 4. Concha bullosa. 5. Left maxillary antrum cyst or polyp. 6. Degenerative disc disease. 7. No traumatic abnormalities of the cervical spine. Electronically Signed   By: Sammie Bench M.D.   On: 02/10/2023 17:37   CT Maxillofacial WO CM  Result Date: 02/10/2023 CLINICAL DATA:  Trauma EXAM: CT HEAD WITHOUT CONTRAST CT MAXILLOFACIAL WITHOUT CONTRAST CT CERVICAL SPINE WITHOUT CONTRAST TECHNIQUE: Multidetector CT imaging of the head, cervical spine, and maxillofacial structures were performed using the standard protocol without intravenous contrast. Multiplanar CT image reconstructions of the cervical spine and maxillofacial structures were also generated. RADIATION DOSE REDUCTION: This exam was performed according to the departmental dose-optimization program which includes automated exposure control, adjustment of the mA and/or kV according to patient size and/or use of iterative reconstruction technique. COMPARISON:  None Available. FINDINGS: CT HEAD FINDINGS Brain: There is periventricular white matter decreased attenuation consistent with small vessel ischemic changes. Gray-white differentiation is preserved. No acute intracranial hemorrhage, mass effect or shift. No hydrocephalus. Vascular: No hyperdense vessel or unexpected calcification. Skull: Normal. Negative for fracture or focal lesion. Other: None. CT MAXILLOFACIAL FINDINGS Osseous: Nasal septal deviation to the right. Bilateral nasal fractures displaced by few mm. Osseous structures are otherwise intact. Orbits: Negative. No traumatic or inflammatory finding. Sinuses: 3 cm left maxillary antrum cyst or polyp. Left-sided concha bullosa. No air-fluid levels or bony destructive processes. Soft tissues: Negative. CT CERVICAL SPINE FINDINGS Alignment: Normal. Skull base and vertebrae: No acute fracture.  No primary bone lesion or focal pathologic process. Soft tissues and spinal canal: No prevertebral fluid or swelling. No visible canal hematoma. Disc levels: Disc space narrowing and marginal osteophyte formation C5-6 and C6-7. Osteoarthritis at C1-C2. Upper chest: Negative. IMPRESSION: 1. Periventricular white matter changes consistent with chronic small vessel ischemia. 2. No acute intracranial process identified. 3. Nasal fractures. 4. Concha bullosa. 5. Left maxillary antrum cyst or polyp. 6. Degenerative disc disease. 7. No traumatic abnormalities of the cervical spine. Electronically Signed   By: Sammie Bench M.D.   On: 02/10/2023 17:37   DG Shoulder Right  Result Date: 02/10/2023 CLINICAL DATA:  Fall, pain EXAM: RIGHT SHOULDER - 2 VIEW COMPARISON:  None Available. FINDINGS: Osseous structures appear osteopenic. There is glenohumeral and acromioclavicular degenerative change with osteophytes. There is suggestion of a discontinuity in the cortex of the proximal humerus surgical neck which could indicate a fracture. Distal scapular dyskinesia could  indicate a fractures well. These findings can be evaluated and confirmed with a CT. IMPRESSION: 1. Osteopenia. 2. Probable humeral neck fracture. 3. Possible fracture of the scapula. 4. CT recommended for further evaluation. Electronically Signed   By: Sammie Bench M.D.   On: 02/10/2023 16:57   DG Chest 1 View  Result Date: 02/10/2023 CLINICAL DATA:  Fall and pain EXAM: CHEST  1 VIEW COMPARISON:  06/11/2022 FINDINGS: Atypical appearance of the right humeral head, suboptimally evaluated. Please see dedicated radiographs. Midline trachea. Mild cardiomegaly. Mediastinal contours otherwise within normal limits. No pleural effusion or pneumothorax. No congestive failure. Clear lungs. IMPRESSION: Cardiomegaly without congestive failure. Electronically Signed   By: Abigail Miyamoto M.D.   On: 02/10/2023 16:56   DG Knee Complete 4 Views Right  Result Date:  02/10/2023 CLINICAL DATA:  Pain fall EXAM: RIGHT KNEE - COMPLETE 4+ VIEW COMPARISON:  None Available. FINDINGS: Status post right total knee arthroplasty. No acute fracture dislocation or subluxation. No periprosthetic lucency to indicate loosening. No effusion. Osseous structures are osteopenic. No focal osteolytic or osteoblastic changes. IMPRESSION: Osteopenia. Otherwise unremarkable examination status post total knee arthroplasty. Electronically Signed   By: Sammie Bench M.D.   On: 02/10/2023 16:55   DG Ankle Complete Left  Result Date: 02/10/2023 CLINICAL DATA:  Fall and pain EXAM: LEFT ANKLE COMPLETE - 3+ VIEW COMPARISON:  09/29/2016 foot radiographs, without report FINDINGS: Diffuse soft tissue swelling. Mild to moderate osteoarthritis about the ankle. No acute fracture or dislocation. Tiny calcaneal spur. Base of fifth metatarsal and talar dome intact. IMPRESSION: Soft tissue swelling and degenerative change, without acute superimposed osseous finding. Electronically Signed   By: Abigail Miyamoto M.D.   On: 02/10/2023 16:54   DG Knee Complete 4 Views Left  Result Date: 02/10/2023 CLINICAL DATA:  Pain after fall EXAM: LEFT KNEE - COMPLETE 4 VIEW COMPARISON:  None Available. FINDINGS: Osteopenia there is comminuted mildly displaced fracture of the mid waist of the patella. Associated large joint effusion. Hyperostosis with osteophytes seen of all 3 compartments which are small. There is also joint space loss of the medial compartment. IMPRESSION: Slightly displaced midbody transverse patellar fracture. Large knee joint effusion. Osteopenia with mild degenerative changes. Electronically Signed   By: Jill Side M.D.   On: 02/10/2023 16:49   DG Wrist Complete Right  Result Date: 02/10/2023 CLINICAL DATA:  Fall.  Pain. EXAM: RIGHT WRIST - COMPLETE 3+ VIEW COMPARISON:  Right hand radiographs 09/29/2016 FINDINGS: There is diffuse decreased bone mineralization. There is again severe thumb  carpometacarpal joint space narrowing with subchondral sclerosis and peripheral osteophytosis. Mild medial greater than lateral triscaphe joint space narrowing and subchondral cystic changes. Mild-to-moderate joint space narrowing of the interphalangeal joints diffusely. No acute fracture is seen. No dislocation. IMPRESSION: 1. No acute fracture. 2. Severe thumb carpometacarpal osteoarthritis. Electronically Signed   By: Yvonne Kendall M.D.   On: 02/10/2023 16:48     Assessment   Kimberly [name] is a 67 y.o. year old female with history of compensated NASH cirrhosis, CHF, CAD, depression, diabetes, GERD, HLD, COPD, IBS-D, hypothyroidism, HTN, OSA, stroke, seizures, obesity s/p duodenal switch, macular degeneration who presented to the ED after sustaining a fall and presented with nosebleed and pain to her face, head, neck, bilateral knees, and right arm. GI consulted due to down trending hemoglobin with heme positive stool.   Anemia: Hgb 10.5 on presentation yesterday with slight drop to 10.2 this morning. Hgb 12.8 three weeks prior.  B12 and folate normal.  No recent iron studies on file. Will obtain. Currently scheduled for outpatient EGD and colonoscopy to be performed in July.  Suspect anemia is likely secondary to multiple orthopedic fractures including a nasal fracture which she had a fairly significant amount of bleeding from.  She did having positive stool but as stated below this is likely in the setting of known hemorrhoids as well as possible ingestion of blood as a result of her nasal fracture.  For now we will continue to monitor H/H and if any worsening/significant anemia or evidence of overt GI bleeding we will consider inpatient EGD.  Continue PPI twice daily.  Nausea, vomiting, IBS-D: Recently seen outpatient with reports of intermittent nausea as well as right upper quadrant pain and greater than 10 loose stools per day.  Stools primarily occurring postprandially.  She had been taking  Imodium without good relief.  Recent celiac serologies negative.  Recommendation was to start cholestyramine if celiac panel negative however patient reports she has not been taking cholestyramine given this on her med list.  She has been taking dicyclomine as needed for abdominal pain.  She continues to have greater than 10 loose stools per day and average although she has not had any diarrhea since her presentation to the hospital.  Continue Zofran 4 mg 3 times daily as needed for nausea or vomiting.  Will keep n.p.o. for now pending orthopedic evaluation.  Cirrhosis: Secondary to NASH cirrhosis.  Last EGD in July 2022 with small hiatal hernia, patent duodenal switch.  No mention of esophageal varices.  Labs with albumin 3.2, AST 49, ALT 28, alk phos 99, T. bili 2.3.  INR stable at 1.1. MELD 3.0: 14. Recent AFP normal at 3.  No evidence of asterixis or ascites on exam.  Patient reports swelling to her lower extremities at home with mother on exam today she has no evidence of peripheral edema other than to her left knee which is the site of the injury from the fall.  Diuretics currently on hold given softer blood pressures.  Would recommend restarting once BP improves.  As noted above she has been having multiple loose stools per day.  Continues to have intermittent forgetfulness however no overt signs of hepatic encephalopathy.  No prior need for paracentesis or SBP.  Due to have surveillance EGD with concurrent colonoscopy in July.  No recent reports of melena or BRBPR.  Suspect heme positive stool likely secondary to known hemorrhoids as well as ingestion of blood as a result of her facial fracture.  Although anemia is new given her presentation she is likely okay to have early interval follow up outpatient.  If ongoing evidence of GI bleed or significant drop in hemoglobin could consider inpatient EGD.   Plan / Recommendations   Iron studies PPI IV BID Remain n.p.o. until orthopedic  evaluation. Restart home lasix and spironolactone when BP improves Start cholestyramine 4g once daily if recurring diarrhea. Antiemetics per hospitalist. Continue to monitor H/H, transfuse for hemoglobin less than 7 Monitor for any overt GI bleeding Proceed with outpatient MRI/MRCP 02/24/23 Could consider inpatient EGD if ongoing significant drop in hemoglobin or overt signs of GI bleeding occur. Early interval follow up outpatient  Outpatient cirrhosis care      02/11/2023, 9:32 AM  Venetia Night, MSN, FNP-BC, AGACNP-BC [facility]

## 2023-02-11 NOTE — Progress Notes (Signed)
  Transition of Care [facility]) Screening Note   Patient Details  Name: Kimberly [name] Date of Birth: 1956-05-05   Transition of Care [facility]) CM/SW Contact:    Ihor Gully, LCSW Phone Number: 02/11/2023, 12:37 PM    Transition of Care Department [facility]) has reviewed patient and no TOC needs have been identified at this time. We will continue to monitor patient advancement through interdisciplinary progression rounds. If new patient transition needs arise, please place a TOC consult.

## 2023-02-11 NOTE — Progress Notes (Signed)
Patient ID: Kimberly [name], female   DOB: Feb 15, 1956, 67 y.o.   MRN:   Frx humerus proximal  Frx patella  BP (!) 107/54   Pulse 67   Temp 98.6 F (37 C) (Oral)   Resp 16   SpO2 95%   Both fractures are non surgical fractures  The issue will be how is she going to ambulate

## 2023-02-11 NOTE — Progress Notes (Signed)
Subjective:  C/o left wrist pain   Apply right wrist brace   Apply ice left knee right shoulder   Add muscle relaxers   Reg diet no surgery   Objective: Vital signs in last 24 hours: Temp:  [98.3 F (36.8 C)-98.6 F (37 C)] 98.6 F (37 C) (03/22 1608) Pulse Rate:  [61-78] 67 (03/22 1608) Resp:  [16-18] 16 (03/22 1608) BP: (106-126)/(54-87) 107/54 (03/22 1608) SpO2:  [95 %-100 %] 95 % (03/22 1608) FiO2 (%):  [21 %] 21 % (03/22 0047)  Intake/Output from previous day: 03/21 0701 - 03/22 0700 In: 240 [P.O.:240] Out: -  Intake/Output this shift: Total I/O In: -  Out: 1 [Stool:1]  Recent Labs    02/10/23 2154 02/11/23 0523  HGB 10.5* 10.2*   Recent Labs    02/10/23 2154 02/11/23 0523  WBC 5.4 4.7  RBC 3.07* 3.02*  HCT 30.8* 30.4*  PLT 106* 106*   Recent Labs    02/10/23 2119 02/11/23 0523  NA 138 136  K 3.6 3.4*  CL 109 106  CO2 20* 23  BUN 18 18  CREATININE 1.02* 1.06*  GLUCOSE 97 85  CALCIUM 8.3* 8.1*   Recent Labs    02/10/23 2234  INR 1.1      Arther Abbott 02/11/2023, 5:13 PM

## 2023-02-11 NOTE — Progress Notes (Signed)
PROGRESS NOTE    Kimberly [name]  [id number] DOB: [birth year] DOA: 02/10/2023 PCP: Glenda Chroman, MD  Brief Narrative:   Kimberly [name] is a 67 y.o. female with past medical history significant for asthma, GERD, hypertension, hyperlipidemia, hypothyroidism, and cirrhosis 2/2 NASH who presents to the emergency department via EMS after sustaining a fall onto her face and right side and without LOC.  On arrival to ED, patient had nosebleed and pain of face, head, neck, both knees, and R arm.  Pulse was 59 and BP was 162/77 but patient was otherwise stable.  Lab workup in the ED was unremarkable except hemoglobin 10.5, with MCV 100.3 and platelets 106.  Multiple imaging studies were obtained, with R shoulder X-ray showing possible humeral neck and scapula fractures, L ankle X-ray with soft tissue swelling and without acute osseous findings, and L knee X-ray with displaced mid-body transverse patella fracture and large knee effusion.  CT of URE confirmed comminuted nondisplaced fracture of R humeral neck and CT head/maxillofacial/cervical spine showed no intracranial processes but nasal fractures and concha bullosa.  Patient was treated with IV fentanyl 50 mcg x 1, morphine, Zofran, and Tdap.  Orthopedic surgeon (Dr. Aline Brochure) was consulted and recommended admitting patient with plan to consult.  Hospitalist was asked to admit patient for further evaluation and management.  Hemoccult was positive and GI was consulted given new anemia and extensive GI history to evaluate for possible bleed.  Problem List:   Principal Problem:   Closed fracture of right proximal humerus Active Problems:   Mixed hyperlipidemia   Acquired hypothyroidism   Essential hypertension   Asthma in adult   Gastroesophageal reflux disease without esophagitis   Left patella fracture   Accidental fall   GI bleed   Nasal fracture   Thrombocytopenia (HCC)   History of cirrhosis  Assessment and Plan:  Nondisplaced  fracture of R humeral neck Transverse fracture of L patella Accidental fall Osteoporosis Patient reports she has osteoporosis treated with a shot (likely bisphosphonate) supposed to be scheduled every 6 months, but her last one was in July.  Will need to resume treatment once appropriate. - Continue fall precautions - Continue morphine 2 mg every 2 hours as needed, cannot do acetaminophen scheduled due to cirrhosis - Tdap administered - Orthopedic surgeon (Dr. Laverta Baltimore) consulted, f/u recs   Nasal fracture Conservative treatment at this time with plan to consider ENT consult if concern for significant soft tissue swelling in critical areas or difficulty speaking, breathing.  Does have some numbness of the L side of face, which is expected in setting of soft tissue swelling.  GI bleed Hemoglobin stable since admission (10.5>>10.2), but down from 12.8 on 2/26.  Did experience episode of emesis early this morning in the ED.  Hemoccult was positive. - Continue IV Protonix 40 mg twice daily - NPO since midnight, sips with meds allowed - GI consulted, f/u recs  Thrombocytopenia May be secondary to hepatic dysfunction given patient's history of cirrhosis, Plt 106 on admission. - Trend Plt daily  Hypokalemia K 3.4 and Mag 1.8, did experience a run of SVT this morning during emesis. - Monitor K and Mag, replete as needed   History of cirrhosis 2/2 NASH No known varices.  Of note, patient was scheduled for routine EGD and colonoscopy in July per outpatient GI.  She also has MCRP planned for April 2/2 to dilated common bile duct and elevated bilirubin w/ RUQ pain. - Lasix and Spironolactone held at this time  due to soft BP   Macrocytic anemia MCV 100.3.  Anemia more likely related to GI bleed, but will work up macrocytosis. - Vitamin B12 and folate levels ordered  Carpal tunnel Patient inquires about carpal tunnel wrist brace for wrist pain, asked patient to discuss with orthopedic surgeon  when they evaluate her.   GERD - Continue PPI and H2B  Essential hypertension - Lasix and spironolactone held at this time due to soft BP, likely in setting of morphine use  Mixed hyperlipidemia - Continue statin   Asthma - Continue Ventolin, Breo Ellipta   Acquired hypothyroidism - Continue levothyroxine 112 mcg daily  DVT prophylaxis: SCDs, pharmacological PPx deferred in setting of possible GI bleed Code Status: Full Family Communication: None present Disposition Plan: Patient is from: Home Anticipated d/c is to: SNF pending PT eval Anticipated d/c date is: TBD Patient currently: Pending orthopedic and GI evaluation for injuries, bleeding Admission Status is: Inpatient Remains inpatient appropriate because: Requires workup for possible GI bleed and ortho evaluation  Consultants:  Gastroenterology Orthopedic Surgery  Procedures:  None  Antimicrobials:  None  Subjective: Patient seen and evaluated today with no new acute complaints or concerns.  The patient continues to experience pain across multiple areas of her body, the worst being in her L knee.  She feels tired and somewhat "out of it."  She is able to breathe through her nose somewhat, though breathing through her mouth is easier.  She notes some numbness on the left side of her face where the swelling is.  She denies headache, vision changes, dizziness, or nausea.  Objective: Vitals:   02/11/23 0548 02/11/23 0932 02/11/23 1116 02/11/23 1204  BP: 117/74 122/73 106/62 126/81  Pulse: 68 74 63 65  Resp: 16 17 18    Temp: 98.3 F (36.8 C) 98.4 F (36.9 C) 98.6 F (37 C) 98.4 F (36.9 C)  TempSrc: Oral Oral Oral Oral  SpO2: 96% 97% 97% 97%    Intake/Output Summary (Last 24 hours) at 02/11/2023 1406 Last data filed at 02/11/2023 1039 Gross per 24 hour  Intake 240 ml  Output 1 ml  Net 239 ml   Examination: General: Adult female resting in bed in no acute distress, but moderately uncomfortable and  intermittently wincing with motion.  R arm in sling, L knee in brace. HEENT: Soft tissue swelling of L side of face, most notable periorally.  Nose with contusions, soft tissue swelling and dried crusted blood at nares.  Numb to palpation under nose and on L cheek. Lungs: CTAB, normal WOB on room air. Cardiovascular: RRR. Normal S1/S2. No murmurs/rubs/gallops. Abdomen: Soft, nondistended. No tenderness to palpation. Normoactive bowel sounds. Extremities: 2+ peripheral pulses bilaterally, including on L foot and R hand.  Good peripheral perfusion and capillary refill <2 seconds. MSK: R arm in sling, full ROM of R fingers and preserved grip strength.  L knee in brace and limited ROM due to pain.  Difficulty moving L ankle as well due to soft tissue swelling and pain. Skin: Warm, dry. Some scrapes and bruises visible on face and extremities. Neuro: Alert and oriented x4  Data Reviewed: I have personally reviewed following labs and imaging studies.  CBC: Recent Labs  Lab 02/10/23 2154 02/11/23 0523  WBC 5.4 4.7  NEUTROABS 3.2  --   HGB 10.5* 10.2*  HCT 30.8* 30.4*  MCV 100.3* 100.7*  PLT 106* [id number]*   Basic Metabolic Panel: Recent Labs  Lab 02/10/23 2119 02/11/23 0523  NA 138 136  K  3.6 3.4*  CL 109 106  CO2 20* 23  GLUCOSE 97 85  BUN 18 18  CREATININE 1.02* 1.06*  CALCIUM 8.3* 8.1*  MG  --  1.8  PHOS  --  3.9   GFR: CrCl cannot be calculated (Unknown ideal weight.). Liver Function Tests: Recent Labs  Lab 02/11/23 0523  AST 49*  ALT 28  ALKPHOS 99  BILITOT 2.3*  PROT 5.5*  ALBUMIN 3.2*   No results for input(s): "LIPASE", "AMYLASE" in the last 168 hours. No results for input(s): "AMMONIA" in the last 168 hours. Coagulation Profile: Recent Labs  Lab 02/10/23 2234  INR 1.1   Cardiac Enzymes: No results for input(s): "CKTOTAL", "CKMB", "CKMBINDEX", "TROPONINI" in the last 168 hours. BNP (last 3 results) No results for input(s): "PROBNP" in the last 8760  hours. HbA1C: No results for input(s): "HGBA1C" in the last 72 hours. CBG: Recent Labs  Lab 02/10/23 2016  GLUCAP 72   Lipid Profile: No results for input(s): "CHOL", "HDL", "LDLCALC", "TRIG", "CHOLHDL", "LDLDIRECT" in the last 72 hours. Thyroid Function Tests: No results for input(s): "TSH", "T4TOTAL", "FREET4", "T3FREE", "THYROIDAB" in the last 72 hours. Anemia Panel: Recent Labs    02/10/23 2154  VITAMINB12 398  FOLATE 20.7   Sepsis Labs: No results for input(s): "PROCALCITON", "LATICACIDVEN" in the last 168 hours.  No results found for this or any previous visit (from the past 240 hour(s)).   Radiology Studies: CT Shoulder Right Wo Contrast  Result Date: 02/10/2023 CLINICAL DATA:  Trauma to the right shoulder. EXAM: CT OF THE UPPER RIGHT EXTREMITY WITHOUT CONTRAST TECHNIQUE: Multidetector CT imaging of the upper right extremity was performed according to the standard protocol. RADIATION DOSE REDUCTION: This exam was performed according to the departmental dose-optimization program which includes automated exposure control, adjustment of the mA and/or kV according to patient size and/or use of iterative reconstruction technique. COMPARISON:  Right shoulder radiograph dated 02/10/2023. FINDINGS: Bones/Joint/Cartilage There is comminuted nondisplaced fracture of the right humeral neck. No other acute fracture. No dislocation. The bones are osteopenic. Degenerative changes and spurring of the inferior bony glenoid. No joint effusion. Ligaments Suboptimally assessed by CT. Muscles and Tendons No acute findings. Soft tissues Soft tissue edema.  No fluid collection or hematoma. IMPRESSION: Comminuted nondisplaced fracture of the right humeral neck. Electronically Signed   By: Anner Crete M.D.   On: 02/10/2023 19:23   CT Head Wo Contrast  Result Date: 02/10/2023 CLINICAL DATA:  Trauma EXAM: CT HEAD WITHOUT CONTRAST CT MAXILLOFACIAL WITHOUT CONTRAST CT CERVICAL SPINE WITHOUT CONTRAST  TECHNIQUE: Multidetector CT imaging of the head, cervical spine, and maxillofacial structures were performed using the standard protocol without intravenous contrast. Multiplanar CT image reconstructions of the cervical spine and maxillofacial structures were also generated. RADIATION DOSE REDUCTION: This exam was performed according to the departmental dose-optimization program which includes automated exposure control, adjustment of the mA and/or kV according to patient size and/or use of iterative reconstruction technique. COMPARISON:  None Available. FINDINGS: CT HEAD FINDINGS Brain: There is periventricular white matter decreased attenuation consistent with small vessel ischemic changes. Gray-white differentiation is preserved. No acute intracranial hemorrhage, mass effect or shift. No hydrocephalus. Vascular: No hyperdense vessel or unexpected calcification. Skull: Normal. Negative for fracture or focal lesion. Other: None. CT MAXILLOFACIAL FINDINGS Osseous: Nasal septal deviation to the right. Bilateral nasal fractures displaced by few mm. Osseous structures are otherwise intact. Orbits: Negative. No traumatic or inflammatory finding. Sinuses: 3 cm left maxillary antrum cyst or polyp. Left-sided concha  bullosa. No air-fluid levels or bony destructive processes. Soft tissues: Negative. CT CERVICAL SPINE FINDINGS Alignment: Normal. Skull base and vertebrae: No acute fracture. No primary bone lesion or focal pathologic process. Soft tissues and spinal canal: No prevertebral fluid or swelling. No visible canal hematoma. Disc levels: Disc space narrowing and marginal osteophyte formation C5-6 and C6-7. Osteoarthritis at C1-C2. Upper chest: Negative. IMPRESSION: 1. Periventricular white matter changes consistent with chronic small vessel ischemia. 2. No acute intracranial process identified. 3. Nasal fractures. 4. Concha bullosa. 5. Left maxillary antrum cyst or polyp. 6. Degenerative disc disease. 7. No traumatic  abnormalities of the cervical spine. Electronically Signed   By: Sammie Bench M.D.   On: 02/10/2023 17:37   CT Cervical Spine Wo Contrast  Result Date: 02/10/2023 CLINICAL DATA:  Trauma EXAM: CT HEAD WITHOUT CONTRAST CT MAXILLOFACIAL WITHOUT CONTRAST CT CERVICAL SPINE WITHOUT CONTRAST TECHNIQUE: Multidetector CT imaging of the head, cervical spine, and maxillofacial structures were performed using the standard protocol without intravenous contrast. Multiplanar CT image reconstructions of the cervical spine and maxillofacial structures were also generated. RADIATION DOSE REDUCTION: This exam was performed according to the departmental dose-optimization program which includes automated exposure control, adjustment of the mA and/or kV according to patient size and/or use of iterative reconstruction technique. COMPARISON:  None Available. FINDINGS: CT HEAD FINDINGS Brain: There is periventricular white matter decreased attenuation consistent with small vessel ischemic changes. Gray-white differentiation is preserved. No acute intracranial hemorrhage, mass effect or shift. No hydrocephalus. Vascular: No hyperdense vessel or unexpected calcification. Skull: Normal. Negative for fracture or focal lesion. Other: None. CT MAXILLOFACIAL FINDINGS Osseous: Nasal septal deviation to the right. Bilateral nasal fractures displaced by few mm. Osseous structures are otherwise intact. Orbits: Negative. No traumatic or inflammatory finding. Sinuses: 3 cm left maxillary antrum cyst or polyp. Left-sided concha bullosa. No air-fluid levels or bony destructive processes. Soft tissues: Negative. CT CERVICAL SPINE FINDINGS Alignment: Normal. Skull base and vertebrae: No acute fracture. No primary bone lesion or focal pathologic process. Soft tissues and spinal canal: No prevertebral fluid or swelling. No visible canal hematoma. Disc levels: Disc space narrowing and marginal osteophyte formation C5-6 and C6-7. Osteoarthritis at  C1-C2. Upper chest: Negative. IMPRESSION: 1. Periventricular white matter changes consistent with chronic small vessel ischemia. 2. No acute intracranial process identified. 3. Nasal fractures. 4. Concha bullosa. 5. Left maxillary antrum cyst or polyp. 6. Degenerative disc disease. 7. No traumatic abnormalities of the cervical spine. Electronically Signed   By: Sammie Bench M.D.   On: 02/10/2023 17:37   CT Maxillofacial WO CM  Result Date: 02/10/2023 CLINICAL DATA:  Trauma EXAM: CT HEAD WITHOUT CONTRAST CT MAXILLOFACIAL WITHOUT CONTRAST CT CERVICAL SPINE WITHOUT CONTRAST TECHNIQUE: Multidetector CT imaging of the head, cervical spine, and maxillofacial structures were performed using the standard protocol without intravenous contrast. Multiplanar CT image reconstructions of the cervical spine and maxillofacial structures were also generated. RADIATION DOSE REDUCTION: This exam was performed according to the departmental dose-optimization program which includes automated exposure control, adjustment of the mA and/or kV according to patient size and/or use of iterative reconstruction technique. COMPARISON:  None Available. FINDINGS: CT HEAD FINDINGS Brain: There is periventricular white matter decreased attenuation consistent with small vessel ischemic changes. Gray-white differentiation is preserved. No acute intracranial hemorrhage, mass effect or shift. No hydrocephalus. Vascular: No hyperdense vessel or unexpected calcification. Skull: Normal. Negative for fracture or focal lesion. Other: None. CT MAXILLOFACIAL FINDINGS Osseous: Nasal septal deviation to the right. Bilateral nasal fractures displaced by  few mm. Osseous structures are otherwise intact. Orbits: Negative. No traumatic or inflammatory finding. Sinuses: 3 cm left maxillary antrum cyst or polyp. Left-sided concha bullosa. No air-fluid levels or bony destructive processes. Soft tissues: Negative. CT CERVICAL SPINE FINDINGS Alignment: Normal.  Skull base and vertebrae: No acute fracture. No primary bone lesion or focal pathologic process. Soft tissues and spinal canal: No prevertebral fluid or swelling. No visible canal hematoma. Disc levels: Disc space narrowing and marginal osteophyte formation C5-6 and C6-7. Osteoarthritis at C1-C2. Upper chest: Negative. IMPRESSION: 1. Periventricular white matter changes consistent with chronic small vessel ischemia. 2. No acute intracranial process identified. 3. Nasal fractures. 4. Concha bullosa. 5. Left maxillary antrum cyst or polyp. 6. Degenerative disc disease. 7. No traumatic abnormalities of the cervical spine. Electronically Signed   By: Sammie Bench M.D.   On: 02/10/2023 17:37   DG Shoulder Right  Result Date: 02/10/2023 CLINICAL DATA:  Fall, pain EXAM: RIGHT SHOULDER - 2 VIEW COMPARISON:  None Available. FINDINGS: Osseous structures appear osteopenic. There is glenohumeral and acromioclavicular degenerative change with osteophytes. There is suggestion of a discontinuity in the cortex of the proximal humerus surgical neck which could indicate a fracture. Distal scapular dyskinesia could indicate a fractures well. These findings can be evaluated and confirmed with a CT. IMPRESSION: 1. Osteopenia. 2. Probable humeral neck fracture. 3. Possible fracture of the scapula. 4. CT recommended for further evaluation. Electronically Signed   By: Sammie Bench M.D.   On: 02/10/2023 16:57   DG Chest 1 View  Result Date: 02/10/2023 CLINICAL DATA:  Fall and pain EXAM: CHEST  1 VIEW COMPARISON:  06/11/2022 FINDINGS: Atypical appearance of the right humeral head, suboptimally evaluated. Please see dedicated radiographs. Midline trachea. Mild cardiomegaly. Mediastinal contours otherwise within normal limits. No pleural effusion or pneumothorax. No congestive failure. Clear lungs. IMPRESSION: Cardiomegaly without congestive failure. Electronically Signed   By: Abigail Miyamoto M.D.   On: 02/10/2023 16:56   DG  Knee Complete 4 Views Right  Result Date: 02/10/2023 CLINICAL DATA:  Pain fall EXAM: RIGHT KNEE - COMPLETE 4+ VIEW COMPARISON:  None Available. FINDINGS: Status post right total knee arthroplasty. No acute fracture dislocation or subluxation. No periprosthetic lucency to indicate loosening. No effusion. Osseous structures are osteopenic. No focal osteolytic or osteoblastic changes. IMPRESSION: Osteopenia. Otherwise unremarkable examination status post total knee arthroplasty. Electronically Signed   By: Sammie Bench M.D.   On: 02/10/2023 16:55   DG Ankle Complete Left  Result Date: 02/10/2023 CLINICAL DATA:  Fall and pain EXAM: LEFT ANKLE COMPLETE - 3+ VIEW COMPARISON:  09/29/2016 foot radiographs, without report FINDINGS: Diffuse soft tissue swelling. Mild to moderate osteoarthritis about the ankle. No acute fracture or dislocation. Tiny calcaneal spur. Base of fifth metatarsal and talar dome intact. IMPRESSION: Soft tissue swelling and degenerative change, without acute superimposed osseous finding. Electronically Signed   By: Abigail Miyamoto M.D.   On: 02/10/2023 16:54   DG Knee Complete 4 Views Left  Result Date: 02/10/2023 CLINICAL DATA:  Pain after fall EXAM: LEFT KNEE - COMPLETE 4 VIEW COMPARISON:  None Available. FINDINGS: Osteopenia there is comminuted mildly displaced fracture of the mid waist of the patella. Associated large joint effusion. Hyperostosis with osteophytes seen of all 3 compartments which are small. There is also joint space loss of the medial compartment. IMPRESSION: Slightly displaced midbody transverse patellar fracture. Large knee joint effusion. Osteopenia with mild degenerative changes. Electronically Signed   By: Jill Side M.D.   On: 02/10/2023  16:49   DG Wrist Complete Right  Result Date: 02/10/2023 CLINICAL DATA:  Fall.  Pain. EXAM: RIGHT WRIST - COMPLETE 3+ VIEW COMPARISON:  Right hand radiographs 09/29/2016 FINDINGS: There is diffuse decreased bone  mineralization. There is again severe thumb carpometacarpal joint space narrowing with subchondral sclerosis and peripheral osteophytosis. Mild medial greater than lateral triscaphe joint space narrowing and subchondral cystic changes. Mild-to-moderate joint space narrowing of the interphalangeal joints diffusely. No acute fracture is seen. No dislocation. IMPRESSION: 1. No acute fracture. 2. Severe thumb carpometacarpal osteoarthritis. Electronically Signed   By: Yvonne Kendall M.D.   On: 02/10/2023 16:48    Scheduled Meds:  atorvastatin  10 mg Oral Daily   famotidine  40 mg Oral Daily   fluticasone furoate-vilanterol  1 puff Inhalation Daily   levothyroxine  112 mcg Oral Q0600   pantoprazole (PROTONIX) IV  40 mg Intravenous Q24H   [START ON 02/12/2023] pneumococcal 20-valent conjugate vaccine  0.5 mL Intramuscular Tomorrow-1000   Continuous Infusions:   LOS: 0 days   Time spent: 35 minutes  Anavictoria Wilk, MS4 [facility]  If 7PM-7AM, please contact night-coverage [email or web address] 02/11/2023, 2:06 PM

## 2023-02-12 DIAGNOSIS — K7460 Unspecified cirrhosis of liver: Secondary | ICD-10-CM | POA: Diagnosis not present

## 2023-02-12 DIAGNOSIS — K921 Melena: Secondary | ICD-10-CM

## 2023-02-12 DIAGNOSIS — S82035A Nondisplaced transverse fracture of left patella, initial encounter for closed fracture: Secondary | ICD-10-CM

## 2023-02-12 DIAGNOSIS — K7581 Nonalcoholic steatohepatitis (NASH): Secondary | ICD-10-CM | POA: Diagnosis not present

## 2023-02-12 DIAGNOSIS — S42294A Other nondisplaced fracture of upper end of right humerus, initial encounter for closed fracture: Secondary | ICD-10-CM

## 2023-02-12 LAB — CBC
HCT: 29.5 % — ABNORMAL LOW (ref 36.0–46.0)
Hemoglobin: 10.2 g/dL — ABNORMAL LOW (ref 12.0–15.0)
MCH: 34.7 pg — ABNORMAL HIGH (ref 26.0–34.0)
MCHC: 34.6 g/dL (ref 30.0–36.0)
MCV: 100.3 fL — ABNORMAL HIGH (ref 80.0–100.0)
Platelets: 105 10*3/uL — ABNORMAL LOW (ref 150–400)
RBC: 2.94 MIL/uL — ABNORMAL LOW (ref 3.87–5.11)
RDW: 13.4 % (ref 11.5–15.5)
WBC: 4 10*3/uL (ref 4.0–10.5)
nRBC: 0 % (ref 0.0–0.2)

## 2023-02-12 LAB — BASIC METABOLIC PANEL
Anion gap: 7 (ref 5–15)
BUN: 16 mg/dL (ref 8–23)
CO2: 24 mmol/L (ref 22–32)
Calcium: 8.1 mg/dL — ABNORMAL LOW (ref 8.9–10.3)
Chloride: 104 mmol/L (ref 98–111)
Creatinine, Ser: 1.23 mg/dL — ABNORMAL HIGH (ref 0.44–1.00)
GFR, Estimated: 49 mL/min — ABNORMAL LOW (ref >60)
Glucose, Bld: 150 mg/dL — ABNORMAL HIGH (ref 70–99)
Potassium: 4 mmol/L (ref 3.5–5.1)
Sodium: 135 mmol/L (ref 135–145)

## 2023-02-12 LAB — MAGNESIUM: Magnesium: 1.8 mg/dL (ref 1.7–2.4)

## 2023-02-12 MED ORDER — CYCLOBENZAPRINE HCL 10 MG PO TABS
5.0000 mg | ORAL_TABLET | Freq: Three times a day (TID) | ORAL | Status: DC | PRN
  Administered 2023-02-12 – 2023-02-13 (×3): 5 mg via ORAL
  Filled 2023-02-12 (×3): qty 1

## 2023-02-12 MED ORDER — FUROSEMIDE 20 MG PO TABS
20.0000 mg | ORAL_TABLET | Freq: Two times a day (BID) | ORAL | Status: DC
  Administered 2023-02-12 – 2023-02-15 (×7): 20 mg via ORAL
  Filled 2023-02-12 (×7): qty 1

## 2023-02-12 MED ORDER — HYDROCORTISONE 1 % EX CREA: TOPICAL_CREAM | Freq: Three times a day (TID) | CUTANEOUS | Status: DC | PRN

## 2023-02-12 MED ORDER — SPIRONOLACTONE 25 MG PO TABS
25.0000 mg | ORAL_TABLET | Freq: Every day | ORAL | Status: DC
  Administered 2023-02-12 – 2023-02-15 (×4): 25 mg via ORAL
  Filled 2023-02-12 (×4): qty 1

## 2023-02-12 MED ORDER — POLYETHYLENE GLYCOL 3350 17 G PO PACK
17.0000 g | PACK | Freq: Every day | ORAL | Status: DC
  Administered 2023-02-12 – 2023-02-13 (×2): 17 g via ORAL
  Filled 2023-02-12 (×4): qty 1

## 2023-02-12 MED ORDER — OXYCODONE-ACETAMINOPHEN 7.5-325 MG PO TABS
1.0000 | ORAL_TABLET | ORAL | Status: DC | PRN
  Administered 2023-02-12 – 2023-02-15 (×11): 1 via ORAL
  Filled 2023-02-12 (×12): qty 1

## 2023-02-12 MED ORDER — LORATADINE 10 MG PO TABS
10.0000 mg | ORAL_TABLET | Freq: Every day | ORAL | Status: DC
  Administered 2023-02-12 – 2023-02-15 (×4): 10 mg via ORAL
  Filled 2023-02-12 (×4): qty 1

## 2023-02-12 MED ORDER — HYDROCORTISONE 0.5 % EX CREA: TOPICAL_CREAM | Freq: Three times a day (TID) | CUTANEOUS | Status: DC | PRN

## 2023-02-12 MED ORDER — OLOPATADINE HCL 0.1 % OP SOLN
1.0000 [drp] | Freq: Two times a day (BID) | OPHTHALMIC | Status: DC
  Administered 2023-02-12 – 2023-02-15 (×7): 1 [drp] via OPHTHALMIC
  Filled 2023-02-12: qty 5

## 2023-02-12 NOTE — Progress Notes (Signed)
PROGRESS NOTE    Kimberly [name]  [id number] DOB: 23-Dec-1955 DOA: 02/10/2023 PCP: Glenda Chroman, MD   Brief Narrative:  Kimberly [name] is a 67 y.o. female with past medical history significant for asthma, GERD, hypertension, hyperlipidemia, hypothyroidism, and cirrhosis 2/2 NASH who presents to the emergency department via EMS after sustaining a fall onto her face and right side and without LOC.  On arrival to ED, patient had nosebleed and pain of face, head, neck, both knees, and R arm.  Pulse was 59 and BP was 162/77 but patient was otherwise stable.  Lab workup in the ED was unremarkable except hemoglobin 10.5, with MCV 100.3 and platelets 106.  Multiple imaging studies were obtained, with R shoulder X-ray showing possible humeral neck and scapula fractures, L ankle X-ray with soft tissue swelling and without acute osseous findings, and L knee X-ray with displaced mid-body transverse patella fracture and large knee effusion.  CT of URE confirmed comminuted nondisplaced fracture of R humeral neck and CT head/maxillofacial/cervical spine showed no intracranial processes but nasal fractures and concha bullosa.  Patient was treated with IV fentanyl 50 mcg x 1, morphine, Zofran, and Tdap.  Orthopedic surgeon (Dr. Aline Brochure) was consulted and recommendations are for nonoperative intervention.  PT evaluation pending.  Hemoccult was positive and GI was consulted given new anemia and extensive GI history to evaluate for possible bleed.   Assessment & Plan:   Principal Problem:   Closed fracture of right proximal humerus Active Problems:   Mixed hyperlipidemia   Acquired hypothyroidism   Essential hypertension   Asthma in adult   Gastroesophageal reflux disease without esophagitis   Left patella fracture   Accidental fall   GI bleed   Nasal fracture   Thrombocytopenia (HCC)   History of cirrhosis  Assessment and Plan:   Nondisplaced fracture of R humeral neck Transverse fracture of L  patella Accidental fall Osteoporosis Patient reports she has osteoporosis treated with a shot (likely bisphosphonate) supposed to be scheduled every 6 months, but her last one was in July.  Will need to resume treatment once appropriate. - Continue fall precautions - Continue morphine 2 mg every 2 hours as needed, cannot do acetaminophen scheduled due to cirrhosis - Tdap administered - Orthopedic surgeon (Dr. Laverta Baltimore) consulted with plans for now need for intervention.  Outpatient follow-up on x-rays weekly.   Nasal fracture Conservative treatment at this time with plan to consider ENT consult if concern for significant soft tissue swelling in critical areas or difficulty speaking, breathing.  Does have some numbness of the L side of face, which is expected in setting of soft tissue swelling.   GI bleed Hemoglobin stable since admission (10.5>>10.2), but down from 12.8 on 2/26.  Did experience episode of emesis early this morning in the ED.  Hemoccult was positive. - Continue IV Protonix 40 mg twice daily -No overt bleeding noted -Currently on full diet with stable hemoglobin levels -Appreciate further recommendations today -MiraLAX started for constipation   Thrombocytopenia-stable May be secondary to hepatic dysfunction given patient's history of cirrhosis, Plt 106 on admission. - Trend Plt daily   Hypokalemia K 3.4 and Mag 1.8, did experience a run of SVT this morning during emesis. - Monitor K and Mag, replete as needed   History of cirrhosis 2/2 NASH No known varices.  Of note, patient was scheduled for routine EGD and colonoscopy in July per outpatient GI.  She also has MCRP planned for April 2/2 to dilated common bile duct  and elevated bilirubin w/ RUQ pain. - Lasix and Spironolactone held at this time due to soft BP   Macrocytic anemia MCV 100.3.  Anemia more likely related to GI bleed, but will work up macrocytosis. - Vitamin B12 and folate levels ordered   Carpal  tunnel Patient inquires about carpal tunnel wrist brace for wrist pain, asked patient to discuss with orthopedic surgeon when they evaluate her.   GERD - Continue PPI and H2B   Essential hypertension - Lasix and spironolactone held at this time due to soft BP, likely in setting of morphine use   Mixed hyperlipidemia - Continue statin   Asthma - Continue Ventolin, Breo Ellipta   Acquired hypothyroidism - Continue levothyroxine 112 mcg daily   DVT prophylaxis: SCDs, pharmacological PPx deferred in setting of possible GI bleed Code Status: Full Family Communication: None present Disposition Plan: Patient is from: Home Anticipated d/c is to: SNF pending PT eval Anticipated d/c date is: TBD Patient currently: Pending orthopedic and GI evaluation for injuries, bleeding Admission Status is: Inpatient Remains inpatient appropriate because: Requires workup for possible GI bleed   Consultants:  Gastroenterology Orthopedic Surgery   Procedures:  None   Antimicrobials:  None  Subjective: Patient seen and evaluated today with no new acute complaints or concerns. No acute concerns or events noted overnight.  Objective: Vitals:   02/11/23 1204 02/11/23 1608 02/11/23 2121 02/12/23 0457  BP: 126/81 (!) 107/54 113/60 124/62  Pulse: 65 67 73 67  Resp:  16 19 20   Temp: 98.4 F (36.9 C) 98.6 F (37 C) 99.1 F (37.3 C) 98.8 F (37.1 C)  TempSrc: Oral Oral Oral Oral  SpO2: 97% 95% 96% 94%    Intake/Output Summary (Last 24 hours) at 02/12/2023 1046 Last data filed at 02/12/2023 0500 Gross per 24 hour  Intake --  Output 1800 ml  Net -1800 ml   There were no vitals filed for this visit.  Examination:  General exam: Appears calm and comfortable  Respiratory system: Clear to auscultation. Respiratory effort normal. Cardiovascular system: S1 & S2 heard, RRR.  Gastrointestinal system: Abdomen is soft Central nervous system: Alert and awake Extremities: No edema, right arm in  sling.  Left lower extremity in splint Skin: No significant lesions noted Psychiatry: Flat affect.    Data Reviewed: I have personally reviewed following labs and imaging studies  CBC: Recent Labs  Lab 02/10/23 2154 02/11/23 0523 02/12/23 0615  WBC 5.4 4.7 4.0  NEUTROABS 3.2  --   --   HGB 10.5* 10.2* 10.2*  HCT 30.8* 30.4* 29.5*  MCV 100.3* 100.7* 100.3*  PLT 106* 106* [id number]*   Basic Metabolic Panel: Recent Labs  Lab 02/10/23 2119 02/11/23 0523 02/12/23 0615  NA 138 136 135  K 3.6 3.4* 4.0  CL 109 106 104  CO2 20* 23 24  GLUCOSE 97 85 150*  BUN 18 18 16   CREATININE 1.02* 1.06* 1.23*  CALCIUM 8.3* 8.1* 8.1*  MG  --  1.8 1.8  PHOS  --  3.9  --    GFR: CrCl cannot be calculated (Unknown ideal weight.). Liver Function Tests: Recent Labs  Lab 02/11/23 0523  AST 49*  ALT 28  ALKPHOS 99  BILITOT 2.3*  PROT 5.5*  ALBUMIN 3.2*   No results for input(s): "LIPASE", "AMYLASE" in the last 168 hours. No results for input(s): "AMMONIA" in the last 168 hours. Coagulation Profile: Recent Labs  Lab 02/10/23 2234  INR 1.1   Cardiac Enzymes: No results for input(s): "  CKTOTAL", "CKMB", "CKMBINDEX", "TROPONINI" in the last 168 hours. BNP (last 3 results) No results for input(s): "PROBNP" in the last 8760 hours. HbA1C: No results for input(s): "HGBA1C" in the last 72 hours. CBG: Recent Labs  Lab 02/10/23 2016 02/11/23 1534  GLUCAP 72 80   Lipid Profile: No results for input(s): "CHOL", "HDL", "LDLCALC", "TRIG", "CHOLHDL", "LDLDIRECT" in the last 72 hours. Thyroid Function Tests: No results for input(s): "TSH", "T4TOTAL", "FREET4", "T3FREE", "THYROIDAB" in the last 72 hours. Anemia Panel: Recent Labs    02/10/23 2154 02/11/23 1510  VITAMINB12 398  --   FOLATE 20.7  --   FERRITIN  --  177  TIBC  --  280  IRON  --  36   Sepsis Labs: No results for input(s): "PROCALCITON", "LATICACIDVEN" in the last 168 hours.  No results found for this or any previous  visit (from the past 240 hour(s)).       Radiology Studies: DG Wrist Complete Left  Result Date: 02/11/2023 CLINICAL DATA:  Pain after fall EXAM: LEFT WRIST - COMPLETE 4 VIEW COMPARISON:  None Available. FINDINGS: Osteopenia. No fracture or dislocation. Preserved joint spaces. Only mild hypertrophic degenerative changes of the first carpometacarpal joint. There is a well corticated density seen the level of the distal radius near the distal radioulnar joint. Possible the sequela of remote trauma or accessory ossicle. If there is persistent pain or further concern of a scaphoid injury, particularly with this level of osteopenia, treatment with follow-up imaging in 7-10 days is recommended as these injuries can be acutely x-ray occult. IMPRESSION: Osteopenia.  Degenerative change. Electronically Signed   By: Jill Side M.D.   On: 02/11/2023 17:57   CT Shoulder Right Wo Contrast  Result Date: 02/10/2023 CLINICAL DATA:  Trauma to the right shoulder. EXAM: CT OF THE UPPER RIGHT EXTREMITY WITHOUT CONTRAST TECHNIQUE: Multidetector CT imaging of the upper right extremity was performed according to the standard protocol. RADIATION DOSE REDUCTION: This exam was performed according to the departmental dose-optimization program which includes automated exposure control, adjustment of the mA and/or kV according to patient size and/or use of iterative reconstruction technique. COMPARISON:  Right shoulder radiograph dated 02/10/2023. FINDINGS: Bones/Joint/Cartilage There is comminuted nondisplaced fracture of the right humeral neck. No other acute fracture. No dislocation. The bones are osteopenic. Degenerative changes and spurring of the inferior bony glenoid. No joint effusion. Ligaments Suboptimally assessed by CT. Muscles and Tendons No acute findings. Soft tissues Soft tissue edema.  No fluid collection or hematoma. IMPRESSION: Comminuted nondisplaced fracture of the right humeral neck. Electronically Signed    By: Anner Crete M.D.   On: 02/10/2023 19:23   CT Head Wo Contrast  Result Date: 02/10/2023 CLINICAL DATA:  Trauma EXAM: CT HEAD WITHOUT CONTRAST CT MAXILLOFACIAL WITHOUT CONTRAST CT CERVICAL SPINE WITHOUT CONTRAST TECHNIQUE: Multidetector CT imaging of the head, cervical spine, and maxillofacial structures were performed using the standard protocol without intravenous contrast. Multiplanar CT image reconstructions of the cervical spine and maxillofacial structures were also generated. RADIATION DOSE REDUCTION: This exam was performed according to the departmental dose-optimization program which includes automated exposure control, adjustment of the mA and/or kV according to patient size and/or use of iterative reconstruction technique. COMPARISON:  None Available. FINDINGS: CT HEAD FINDINGS Brain: There is periventricular white matter decreased attenuation consistent with small vessel ischemic changes. Gray-white differentiation is preserved. No acute intracranial hemorrhage, mass effect or shift. No hydrocephalus. Vascular: No hyperdense vessel or unexpected calcification. Skull: Normal. Negative for fracture or focal  lesion. Other: None. CT MAXILLOFACIAL FINDINGS Osseous: Nasal septal deviation to the right. Bilateral nasal fractures displaced by few mm. Osseous structures are otherwise intact. Orbits: Negative. No traumatic or inflammatory finding. Sinuses: 3 cm left maxillary antrum cyst or polyp. Left-sided concha bullosa. No air-fluid levels or bony destructive processes. Soft tissues: Negative. CT CERVICAL SPINE FINDINGS Alignment: Normal. Skull base and vertebrae: No acute fracture. No primary bone lesion or focal pathologic process. Soft tissues and spinal canal: No prevertebral fluid or swelling. No visible canal hematoma. Disc levels: Disc space narrowing and marginal osteophyte formation C5-6 and C6-7. Osteoarthritis at C1-C2. Upper chest: Negative. IMPRESSION: 1. Periventricular white matter  changes consistent with chronic small vessel ischemia. 2. No acute intracranial process identified. 3. Nasal fractures. 4. Concha bullosa. 5. Left maxillary antrum cyst or polyp. 6. Degenerative disc disease. 7. No traumatic abnormalities of the cervical spine. Electronically Signed   By: Sammie Bench M.D.   On: 02/10/2023 17:37   CT Cervical Spine Wo Contrast  Result Date: 02/10/2023 CLINICAL DATA:  Trauma EXAM: CT HEAD WITHOUT CONTRAST CT MAXILLOFACIAL WITHOUT CONTRAST CT CERVICAL SPINE WITHOUT CONTRAST TECHNIQUE: Multidetector CT imaging of the head, cervical spine, and maxillofacial structures were performed using the standard protocol without intravenous contrast. Multiplanar CT image reconstructions of the cervical spine and maxillofacial structures were also generated. RADIATION DOSE REDUCTION: This exam was performed according to the departmental dose-optimization program which includes automated exposure control, adjustment of the mA and/or kV according to patient size and/or use of iterative reconstruction technique. COMPARISON:  None Available. FINDINGS: CT HEAD FINDINGS Brain: There is periventricular white matter decreased attenuation consistent with small vessel ischemic changes. Gray-white differentiation is preserved. No acute intracranial hemorrhage, mass effect or shift. No hydrocephalus. Vascular: No hyperdense vessel or unexpected calcification. Skull: Normal. Negative for fracture or focal lesion. Other: None. CT MAXILLOFACIAL FINDINGS Osseous: Nasal septal deviation to the right. Bilateral nasal fractures displaced by few mm. Osseous structures are otherwise intact. Orbits: Negative. No traumatic or inflammatory finding. Sinuses: 3 cm left maxillary antrum cyst or polyp. Left-sided concha bullosa. No air-fluid levels or bony destructive processes. Soft tissues: Negative. CT CERVICAL SPINE FINDINGS Alignment: Normal. Skull base and vertebrae: No acute fracture. No primary bone lesion or  focal pathologic process. Soft tissues and spinal canal: No prevertebral fluid or swelling. No visible canal hematoma. Disc levels: Disc space narrowing and marginal osteophyte formation C5-6 and C6-7. Osteoarthritis at C1-C2. Upper chest: Negative. IMPRESSION: 1. Periventricular white matter changes consistent with chronic small vessel ischemia. 2. No acute intracranial process identified. 3. Nasal fractures. 4. Concha bullosa. 5. Left maxillary antrum cyst or polyp. 6. Degenerative disc disease. 7. No traumatic abnormalities of the cervical spine. Electronically Signed   By: Sammie Bench M.D.   On: 02/10/2023 17:37   CT Maxillofacial WO CM  Result Date: 02/10/2023 CLINICAL DATA:  Trauma EXAM: CT HEAD WITHOUT CONTRAST CT MAXILLOFACIAL WITHOUT CONTRAST CT CERVICAL SPINE WITHOUT CONTRAST TECHNIQUE: Multidetector CT imaging of the head, cervical spine, and maxillofacial structures were performed using the standard protocol without intravenous contrast. Multiplanar CT image reconstructions of the cervical spine and maxillofacial structures were also generated. RADIATION DOSE REDUCTION: This exam was performed according to the departmental dose-optimization program which includes automated exposure control, adjustment of the mA and/or kV according to patient size and/or use of iterative reconstruction technique. COMPARISON:  None Available. FINDINGS: CT HEAD FINDINGS Brain: There is periventricular white matter decreased attenuation consistent with small vessel ischemic changes. Gray-white differentiation is  preserved. No acute intracranial hemorrhage, mass effect or shift. No hydrocephalus. Vascular: No hyperdense vessel or unexpected calcification. Skull: Normal. Negative for fracture or focal lesion. Other: None. CT MAXILLOFACIAL FINDINGS Osseous: Nasal septal deviation to the right. Bilateral nasal fractures displaced by few mm. Osseous structures are otherwise intact. Orbits: Negative. No traumatic or  inflammatory finding. Sinuses: 3 cm left maxillary antrum cyst or polyp. Left-sided concha bullosa. No air-fluid levels or bony destructive processes. Soft tissues: Negative. CT CERVICAL SPINE FINDINGS Alignment: Normal. Skull base and vertebrae: No acute fracture. No primary bone lesion or focal pathologic process. Soft tissues and spinal canal: No prevertebral fluid or swelling. No visible canal hematoma. Disc levels: Disc space narrowing and marginal osteophyte formation C5-6 and C6-7. Osteoarthritis at C1-C2. Upper chest: Negative. IMPRESSION: 1. Periventricular white matter changes consistent with chronic small vessel ischemia. 2. No acute intracranial process identified. 3. Nasal fractures. 4. Concha bullosa. 5. Left maxillary antrum cyst or polyp. 6. Degenerative disc disease. 7. No traumatic abnormalities of the cervical spine. Electronically Signed   By: Sammie Bench M.D.   On: 02/10/2023 17:37   DG Shoulder Right  Result Date: 02/10/2023 CLINICAL DATA:  Fall, pain EXAM: RIGHT SHOULDER - 2 VIEW COMPARISON:  None Available. FINDINGS: Osseous structures appear osteopenic. There is glenohumeral and acromioclavicular degenerative change with osteophytes. There is suggestion of a discontinuity in the cortex of the proximal humerus surgical neck which could indicate a fracture. Distal scapular dyskinesia could indicate a fractures well. These findings can be evaluated and confirmed with a CT. IMPRESSION: 1. Osteopenia. 2. Probable humeral neck fracture. 3. Possible fracture of the scapula. 4. CT recommended for further evaluation. Electronically Signed   By: Sammie Bench M.D.   On: 02/10/2023 16:57   DG Chest 1 View  Result Date: 02/10/2023 CLINICAL DATA:  Fall and pain EXAM: CHEST  1 VIEW COMPARISON:  06/11/2022 FINDINGS: Atypical appearance of the right humeral head, suboptimally evaluated. Please see dedicated radiographs. Midline trachea. Mild cardiomegaly. Mediastinal contours otherwise  within normal limits. No pleural effusion or pneumothorax. No congestive failure. Clear lungs. IMPRESSION: Cardiomegaly without congestive failure. Electronically Signed   By: Abigail Miyamoto M.D.   On: 02/10/2023 16:56   DG Knee Complete 4 Views Right  Result Date: 02/10/2023 CLINICAL DATA:  Pain fall EXAM: RIGHT KNEE - COMPLETE 4+ VIEW COMPARISON:  None Available. FINDINGS: Status post right total knee arthroplasty. No acute fracture dislocation or subluxation. No periprosthetic lucency to indicate loosening. No effusion. Osseous structures are osteopenic. No focal osteolytic or osteoblastic changes. IMPRESSION: Osteopenia. Otherwise unremarkable examination status post total knee arthroplasty. Electronically Signed   By: Sammie Bench M.D.   On: 02/10/2023 16:55   DG Ankle Complete Left  Result Date: 02/10/2023 CLINICAL DATA:  Fall and pain EXAM: LEFT ANKLE COMPLETE - 3+ VIEW COMPARISON:  09/29/2016 foot radiographs, without report FINDINGS: Diffuse soft tissue swelling. Mild to moderate osteoarthritis about the ankle. No acute fracture or dislocation. Tiny calcaneal spur. Base of fifth metatarsal and talar dome intact. IMPRESSION: Soft tissue swelling and degenerative change, without acute superimposed osseous finding. Electronically Signed   By: Abigail Miyamoto M.D.   On: 02/10/2023 16:54   DG Knee Complete 4 Views Left  Result Date: 02/10/2023 CLINICAL DATA:  Pain after fall EXAM: LEFT KNEE - COMPLETE 4 VIEW COMPARISON:  None Available. FINDINGS: Osteopenia there is comminuted mildly displaced fracture of the mid waist of the patella. Associated large joint effusion. Hyperostosis with osteophytes seen of all 3  compartments which are small. There is also joint space loss of the medial compartment. IMPRESSION: Slightly displaced midbody transverse patellar fracture. Large knee joint effusion. Osteopenia with mild degenerative changes. Electronically Signed   By: Jill Side M.D.   On: 02/10/2023 16:49    DG Wrist Complete Right  Result Date: 02/10/2023 CLINICAL DATA:  Fall.  Pain. EXAM: RIGHT WRIST - COMPLETE 3+ VIEW COMPARISON:  Right hand radiographs 09/29/2016 FINDINGS: There is diffuse decreased bone mineralization. There is again severe thumb carpometacarpal joint space narrowing with subchondral sclerosis and peripheral osteophytosis. Mild medial greater than lateral triscaphe joint space narrowing and subchondral cystic changes. Mild-to-moderate joint space narrowing of the interphalangeal joints diffusely. No acute fracture is seen. No dislocation. IMPRESSION: 1. No acute fracture. 2. Severe thumb carpometacarpal osteoarthritis. Electronically Signed   By: Yvonne Kendall M.D.   On: 02/10/2023 16:48        Scheduled Meds:  atorvastatin  10 mg Oral Daily   famotidine  40 mg Oral Daily   fluticasone furoate-vilanterol  1 puff Inhalation Daily   furosemide  20 mg Oral BID   levothyroxine  112 mcg Oral Q0600   olopatadine  1 drop Both Eyes BID   pantoprazole (PROTONIX) IV  40 mg Intravenous Q24H   pneumococcal 20-valent conjugate vaccine  0.5 mL Intramuscular Tomorrow-1000   polyethylene glycol  17 g Oral Daily   spironolactone  25 mg Oral Daily     LOS: 1 day    Time spent: 35 minutes    Juanita Devincent Darleen Crocker, DO [facility]  If 7PM-7AM, please contact night-coverage [email or web address] 02/12/2023, 10:46 AM

## 2023-02-12 NOTE — Progress Notes (Signed)
Patient complains of only below skeletal pain.  Denies melena, hematemesis or hematochezia. Nursing staff provided MiraLAX earlier for mild constipation   Vital signs in last 24 hours: Temp:  [98.4 F (36.9 C)-99.1 F (37.3 C)] 98.8 F (37.1 C) (03/23 0457) Pulse Rate:  [63-74] 67 (03/23 0457) Resp:  [16-20] 20 (03/23 0457) BP: (106-126)/(54-81) 124/62 (03/23 0457) SpO2:  [94 %-97 %] 94 % (03/23 0457) Last BM Date : 02/11/23 General:   Alert,   pleasant and cooperative in NAD.  Facial contusions apparent. Abdomen: Nondistended.  Soft and nontender  Intake/Output from previous day: 03/22 0701 - 03/23 0700 In: -  Out: 1801 [Urine:1800; Stool:1] Intake/Output this shift: No intake/output data recorded.  Lab Results: Recent Labs    02/10/23 2154 02/11/23 0523 02/12/23 0615  WBC 5.4 4.7 4.0  HGB 10.5* 10.2* 10.2*  HCT 30.8* 30.4* 29.5*  PLT 106* 106* 105*   BMET Recent Labs    02/10/23 2119 02/11/23 0523 02/12/23 0615  NA 138 136 135  K 3.6 3.4* 4.0  CL 109 106 104  CO2 20* 23 24  GLUCOSE 97 85 150*  BUN 18 18 16   CREATININE 1.02* 1.06* 1.23*  CALCIUM 8.3* 8.1* 8.1*   LFT Recent Labs    02/11/23 0523  PROT 5.5*  ALBUMIN 3.2*  AST 49*  ALT 28  ALKPHOS 99  BILITOT 2.3*   PT/INR Recent Labs    02/10/23 2234  LABPROT 14.2  INR 1.1   Hepatitis Panel No results for input(s): "HEPBSAG", "HCVAB", "HEPAIGM", "HEPBIGM" in the last 72 hours. C-Diff No results for input(s): "CDIFFTOX" in the last 72 hours.  Studies/Results: DG Wrist Complete Left  Result Date: 02/11/2023 CLINICAL DATA:  Pain after fall EXAM: LEFT WRIST - COMPLETE 4 VIEW COMPARISON:  None Available. FINDINGS: Osteopenia. No fracture or dislocation. Preserved joint spaces. Only mild hypertrophic degenerative changes of the first carpometacarpal joint. There is a well corticated density seen the level of the distal radius near the distal radioulnar joint. Possible the sequela of remote trauma  or accessory ossicle. If there is persistent pain or further concern of a scaphoid injury, particularly with this level of osteopenia, treatment with follow-up imaging in 7-10 days is recommended as these injuries can be acutely x-ray occult. IMPRESSION: Osteopenia.  Degenerative change. Electronically Signed   By: Jill Side M.D.   On: 02/11/2023 17:57   CT Shoulder Right Wo Contrast  Result Date: 02/10/2023 CLINICAL DATA:  Trauma to the right shoulder. EXAM: CT OF THE UPPER RIGHT EXTREMITY WITHOUT CONTRAST TECHNIQUE: Multidetector CT imaging of the upper right extremity was performed according to the standard protocol. RADIATION DOSE REDUCTION: This exam was performed according to the departmental dose-optimization program which includes automated exposure control, adjustment of the mA and/or kV according to patient size and/or use of iterative reconstruction technique. COMPARISON:  Right shoulder radiograph dated 02/10/2023. FINDINGS: Bones/Joint/Cartilage There is comminuted nondisplaced fracture of the right humeral neck. No other acute fracture. No dislocation. The bones are osteopenic. Degenerative changes and spurring of the inferior bony glenoid. No joint effusion. Ligaments Suboptimally assessed by CT. Muscles and Tendons No acute findings. Soft tissues Soft tissue edema.  No fluid collection or hematoma. IMPRESSION: Comminuted nondisplaced fracture of the right humeral neck. Electronically Signed   By: Anner Crete M.D.   On: 02/10/2023 19:23   CT Head Wo Contrast  Result Date: 02/10/2023 CLINICAL DATA:  Trauma EXAM: CT HEAD WITHOUT CONTRAST CT MAXILLOFACIAL WITHOUT CONTRAST CT CERVICAL SPINE WITHOUT  CONTRAST TECHNIQUE: Multidetector CT imaging of the head, cervical spine, and maxillofacial structures were performed using the standard protocol without intravenous contrast. Multiplanar CT image reconstructions of the cervical spine and maxillofacial structures were also generated. RADIATION  DOSE REDUCTION: This exam was performed according to the departmental dose-optimization program which includes automated exposure control, adjustment of the mA and/or kV according to patient size and/or use of iterative reconstruction technique. COMPARISON:  None Available. FINDINGS: CT HEAD FINDINGS Brain: There is periventricular white matter decreased attenuation consistent with small vessel ischemic changes. Gray-white differentiation is preserved. No acute intracranial hemorrhage, mass effect or shift. No hydrocephalus. Vascular: No hyperdense vessel or unexpected calcification. Skull: Normal. Negative for fracture or focal lesion. Other: None. CT MAXILLOFACIAL FINDINGS Osseous: Nasal septal deviation to the right. Bilateral nasal fractures displaced by few mm. Osseous structures are otherwise intact. Orbits: Negative. No traumatic or inflammatory finding. Sinuses: 3 cm left maxillary antrum cyst or polyp. Left-sided concha bullosa. No air-fluid levels or bony destructive processes. Soft tissues: Negative. CT CERVICAL SPINE FINDINGS Alignment: Normal. Skull base and vertebrae: No acute fracture. No primary bone lesion or focal pathologic process. Soft tissues and spinal canal: No prevertebral fluid or swelling. No visible canal hematoma. Disc levels: Disc space narrowing and marginal osteophyte formation C5-6 and C6-7. Osteoarthritis at C1-C2. Upper chest: Negative. IMPRESSION: 1. Periventricular white matter changes consistent with chronic small vessel ischemia. 2. No acute intracranial process identified. 3. Nasal fractures. 4. Concha bullosa. 5. Left maxillary antrum cyst or polyp. 6. Degenerative disc disease. 7. No traumatic abnormalities of the cervical spine. Electronically Signed   By: Sammie Bench M.D.   On: 02/10/2023 17:37   CT Cervical Spine Wo Contrast  Result Date: 02/10/2023 CLINICAL DATA:  Trauma EXAM: CT HEAD WITHOUT CONTRAST CT MAXILLOFACIAL WITHOUT CONTRAST CT CERVICAL SPINE WITHOUT  CONTRAST TECHNIQUE: Multidetector CT imaging of the head, cervical spine, and maxillofacial structures were performed using the standard protocol without intravenous contrast. Multiplanar CT image reconstructions of the cervical spine and maxillofacial structures were also generated. RADIATION DOSE REDUCTION: This exam was performed according to the departmental dose-optimization program which includes automated exposure control, adjustment of the mA and/or kV according to patient size and/or use of iterative reconstruction technique. COMPARISON:  None Available. FINDINGS: CT HEAD FINDINGS Brain: There is periventricular white matter decreased attenuation consistent with small vessel ischemic changes. Gray-white differentiation is preserved. No acute intracranial hemorrhage, mass effect or shift. No hydrocephalus. Vascular: No hyperdense vessel or unexpected calcification. Skull: Normal. Negative for fracture or focal lesion. Other: None. CT MAXILLOFACIAL FINDINGS Osseous: Nasal septal deviation to the right. Bilateral nasal fractures displaced by few mm. Osseous structures are otherwise intact. Orbits: Negative. No traumatic or inflammatory finding. Sinuses: 3 cm left maxillary antrum cyst or polyp. Left-sided concha bullosa. No air-fluid levels or bony destructive processes. Soft tissues: Negative. CT CERVICAL SPINE FINDINGS Alignment: Normal. Skull base and vertebrae: No acute fracture. No primary bone lesion or focal pathologic process. Soft tissues and spinal canal: No prevertebral fluid or swelling. No visible canal hematoma. Disc levels: Disc space narrowing and marginal osteophyte formation C5-6 and C6-7. Osteoarthritis at C1-C2. Upper chest: Negative. IMPRESSION: 1. Periventricular white matter changes consistent with chronic small vessel ischemia. 2. No acute intracranial process identified. 3. Nasal fractures. 4. Concha bullosa. 5. Left maxillary antrum cyst or polyp. 6. Degenerative disc disease. 7. No  traumatic abnormalities of the cervical spine. Electronically Signed   By: Sammie Bench M.D.   On: 02/10/2023 17:37  CT Maxillofacial WO CM  Result Date: 02/10/2023 CLINICAL DATA:  Trauma EXAM: CT HEAD WITHOUT CONTRAST CT MAXILLOFACIAL WITHOUT CONTRAST CT CERVICAL SPINE WITHOUT CONTRAST TECHNIQUE: Multidetector CT imaging of the head, cervical spine, and maxillofacial structures were performed using the standard protocol without intravenous contrast. Multiplanar CT image reconstructions of the cervical spine and maxillofacial structures were also generated. RADIATION DOSE REDUCTION: This exam was performed according to the departmental dose-optimization program which includes automated exposure control, adjustment of the mA and/or kV according to patient size and/or use of iterative reconstruction technique. COMPARISON:  None Available. FINDINGS: CT HEAD FINDINGS Brain: There is periventricular white matter decreased attenuation consistent with small vessel ischemic changes. Gray-white differentiation is preserved. No acute intracranial hemorrhage, mass effect or shift. No hydrocephalus. Vascular: No hyperdense vessel or unexpected calcification. Skull: Normal. Negative for fracture or focal lesion. Other: None. CT MAXILLOFACIAL FINDINGS Osseous: Nasal septal deviation to the right. Bilateral nasal fractures displaced by few mm. Osseous structures are otherwise intact. Orbits: Negative. No traumatic or inflammatory finding. Sinuses: 3 cm left maxillary antrum cyst or polyp. Left-sided concha bullosa. No air-fluid levels or bony destructive processes. Soft tissues: Negative. CT CERVICAL SPINE FINDINGS Alignment: Normal. Skull base and vertebrae: No acute fracture. No primary bone lesion or focal pathologic process. Soft tissues and spinal canal: No prevertebral fluid or swelling. No visible canal hematoma. Disc levels: Disc space narrowing and marginal osteophyte formation C5-6 and C6-7. Osteoarthritis at  C1-C2. Upper chest: Negative. IMPRESSION: 1. Periventricular white matter changes consistent with chronic small vessel ischemia. 2. No acute intracranial process identified. 3. Nasal fractures. 4. Concha bullosa. 5. Left maxillary antrum cyst or polyp. 6. Degenerative disc disease. 7. No traumatic abnormalities of the cervical spine. Electronically Signed   By: Sammie Bench M.D.   On: 02/10/2023 17:37   DG Shoulder Right  Result Date: 02/10/2023 CLINICAL DATA:  Fall, pain EXAM: RIGHT SHOULDER - 2 VIEW COMPARISON:  None Available. FINDINGS: Osseous structures appear osteopenic. There is glenohumeral and acromioclavicular degenerative change with osteophytes. There is suggestion of a discontinuity in the cortex of the proximal humerus surgical neck which could indicate a fracture. Distal scapular dyskinesia could indicate a fractures well. These findings can be evaluated and confirmed with a CT. IMPRESSION: 1. Osteopenia. 2. Probable humeral neck fracture. 3. Possible fracture of the scapula. 4. CT recommended for further evaluation. Electronically Signed   By: Sammie Bench M.D.   On: 02/10/2023 16:57   DG Chest 1 View  Result Date: 02/10/2023 CLINICAL DATA:  Fall and pain EXAM: CHEST  1 VIEW COMPARISON:  06/11/2022 FINDINGS: Atypical appearance of the right humeral head, suboptimally evaluated. Please see dedicated radiographs. Midline trachea. Mild cardiomegaly. Mediastinal contours otherwise within normal limits. No pleural effusion or pneumothorax. No congestive failure. Clear lungs. IMPRESSION: Cardiomegaly without congestive failure. Electronically Signed   By: Abigail Miyamoto M.D.   On: 02/10/2023 16:56   DG Knee Complete 4 Views Right  Result Date: 02/10/2023 CLINICAL DATA:  Pain fall EXAM: RIGHT KNEE - COMPLETE 4+ VIEW COMPARISON:  None Available. FINDINGS: Status post right total knee arthroplasty. No acute fracture dislocation or subluxation. No periprosthetic lucency to indicate loosening.  No effusion. Osseous structures are osteopenic. No focal osteolytic or osteoblastic changes. IMPRESSION: Osteopenia. Otherwise unremarkable examination status post total knee arthroplasty. Electronically Signed   By: Sammie Bench M.D.   On: 02/10/2023 16:55   DG Ankle Complete Left  Result Date: 02/10/2023 CLINICAL DATA:  Fall and pain EXAM: LEFT ANKLE  COMPLETE - 3+ VIEW COMPARISON:  09/29/2016 foot radiographs, without report FINDINGS: Diffuse soft tissue swelling. Mild to moderate osteoarthritis about the ankle. No acute fracture or dislocation. Tiny calcaneal spur. Base of fifth metatarsal and talar dome intact. IMPRESSION: Soft tissue swelling and degenerative change, without acute superimposed osseous finding. Electronically Signed   By: Abigail Miyamoto M.D.   On: 02/10/2023 16:54   DG Knee Complete 4 Views Left  Result Date: 02/10/2023 CLINICAL DATA:  Pain after fall EXAM: LEFT KNEE - COMPLETE 4 VIEW COMPARISON:  None Available. FINDINGS: Osteopenia there is comminuted mildly displaced fracture of the mid waist of the patella. Associated large joint effusion. Hyperostosis with osteophytes seen of all 3 compartments which are small. There is also joint space loss of the medial compartment. IMPRESSION: Slightly displaced midbody transverse patellar fracture. Large knee joint effusion. Osteopenia with mild degenerative changes. Electronically Signed   By: Jill Side M.D.   On: 02/10/2023 16:49   DG Wrist Complete Right  Result Date: 02/10/2023 CLINICAL DATA:  Fall.  Pain. EXAM: RIGHT WRIST - COMPLETE 3+ VIEW COMPARISON:  Right hand radiographs 09/29/2016 FINDINGS: There is diffuse decreased bone mineralization. There is again severe thumb carpometacarpal joint space narrowing with subchondral sclerosis and peripheral osteophytosis. Mild medial greater than lateral triscaphe joint space narrowing and subchondral cystic changes. Mild-to-moderate joint space narrowing of the interphalangeal joints  diffusely. No acute fracture is seen. No dislocation. IMPRESSION: 1. No acute fracture. 2. Severe thumb carpometacarpal osteoarthritis. Electronically Signed   By: Yvonne Kendall M.D.   On: 02/10/2023 16:48     Impression:    67 year old lady with compensated NASH cirrhosis, unfortunately suffered a fall with multiple orthopedic injuries.  Trauma induced epistaxis.  Patient has swallowed blood.  Found to be occult blood positive and anemic.  No overt GI bleeding.  Hemoglobin stable.  I doubt this lady has suffered a significant GI bleed.  Hemoccult positive stool likely from swallowed blood from a nosebleed.  Recommendations:  No inpatient GI evaluation warranted at this time  Will arrange a early interval outpatient follow-up in our office in about 4 weeks  Continue her outpatient GI regimen without change.

## 2023-02-12 NOTE — Consult Note (Signed)
Date of consultation February 11, 2023 note written February 12, 2023  ORTHOPAEDIC CONSULTATION  REQUESTING PHYSICIAN: Heath Lark D, DO  ASSESSMENT AND PLAN: 67 y.o. female with the following: Proximal humerus fracture right shoulder, patella fracture left knee  This patient requires inpatient admission to manage this problem appropriately.  Per medicine Orthopedics recommends admission to a medical service and we will provide consultation and follow along  - Weight Bearing Status/Activity: As tolerated on the left lower extremity nonweightbearing on the right upper extremity  Follow-up for x-rays of both injuries 1 week after discharge from the hospital  - Additional recommended labs/tests: None  -VTE Prophylaxis: Per medicine  - Pain control: Per medicine  - Follow-up plan: X-ray weekly first 3 weeks then at 6 weeks and 12 weeks depending on fracture healing  -Procedures: None recommended  Chief Complaint: Right shoulder pain left knee pain right wrist pain left wrist pain  HPI: Kimberly [name] is a 67 y.o. female with history of falling at home she also fractured her nose she has some neck pain as well.  I believe this to be coup contrecoup neck pain as she is having no new upper extremity symptoms she has a history of carpal tunnel syndrome on the right  Injury date February 10, 2023 She complains of right shoulder pain which she describes as severe but better at rest.  Nonradiating it is constant  She also complains of left knee pain and inability to lift the leg off the bed.  She says that pain is severe as well.  She also has some pain in her right wrist despite negative x-rays.  The x-rays that I saw show arthritis of the hand and CMC joint with no fracture and I agree with those x-rays as I have reviewed them.  Left wrist will be x-rayed and followed for any fracture    Past Medical History:  Diagnosis Date   Anemia    PMH: as a child   Arthritis    Asthma    CAD  (coronary artery disease)    nonobstructive by cath, 6/08 (false positive Cardiolite) normal stress echo, 7/11   CHF (congestive heart failure) (HCC)    Chronic back pain    Chronic bronchitis (HCC)    Cirrhosis (HCC)    Complication of anesthesia    had an asthma attack when woke up from procedure   Complication of anesthesia    asthma attack with one surgery   COPD (chronic obstructive pulmonary disease) ([place])    Degenerative joint disease    Depression    Diabetes mellitus without complication (HCC)    DJD (degenerative joint disease)    Fatty liver    Fibromyalgia    GERD (gastroesophageal reflux disease)    Glaucoma    Headache(784.0)    Heart failure, diastolic, chronic (HCC)    Heart murmur    PMH:As a child only   Hernia of abdominal cavity    "upper and lower hernia"   History of hiatal hernia    HTN (hypertension)    Hypertension    Hypothyroidism    IBS (irritable bowel syndrome)    Morbid obesity (HCC)    Neuropathy    associated with diabetes   Obstructive sleep apnea    Pancreatitis    Pinched nerve in neck    Pneumonia    as a child   PONV (postoperative nausea and vomiting)    Pulmonary hypertension (HCC)    Rheumatic fever  PMH: as a child   Seizures ([place])    PMH: only as a child   Seizures ([place])    in childhood   Shortness of breath    Sleep apnea    Spinal stenosis    Stroke ([place])    12/09/2013   Past Surgical History:  Procedure Laterality Date   ABDOMINAL HYSTERECTOMY     ABDOMINAL HYSTERECTOMY     partial hysterectomy   APPENDECTOMY     BACK SURGERY     BACK SURGERY     disc surgery with complications and damage to right side   BARIATRIC SURGERY     DS in June 2020   [place]  2000, 2009   BIOPSY  06/16/2021   Procedure: BIOPSY;  Surgeon: Harvel Quale, MD;  [facility];  Service: Gastroenterology;;   BREAST CYST EXCISION Left    CARDIAC CATHETERIZATION     2009   CATARACT EXTRACTION  W/ INTRAOCULAR LENS  IMPLANT, BILATERAL     CHOLECYSTECTOMY  11/22/1992   CHOLECYSTECTOMY     COLONOSCOPY N/A 08/01/2013   Procedure: COLONOSCOPY;  Surgeon: Rogene Houston, MD;  [facility];  Service: Endoscopy;  Laterality: N/A;  930   COLONOSCOPY N/A 08/03/2017   Procedure: COLONOSCOPY;  Surgeon: Rogene Houston, MD;  [facility];  Service: Endoscopy;  Laterality: N/A;  12:00   CYST EXCISION     Left breast   ESOPHAGOGASTRODUODENOSCOPY (EGD) WITH PROPOFOL N/A 06/16/2021   Procedure: ESOPHAGOGASTRODUODENOSCOPY (EGD) WITH PROPOFOL;  Surgeon: Harvel Quale, MD;  [facility];  Service: Gastroenterology;  Laterality: N/A;  12:50   EXCISIONAL HEMORRHOIDECTOMY     EYE SURGERY  11/22/1965   EYE SURGERY     bilateral cataract removal with lens implants   EYE SURGERY     growth removed from right eye lid   EYE SURGERY     bilateral eye surgery age 66 t"to straighten eyes'   heel (other)  11/23/2003   HEEL SPUR SURGERY     Right heel - growth removal and rebuilding of heel   HEMORRHOID SURGERY     HERNIA REPAIR     KNEE ARTHROSCOPY Bilateral    LUMBAR LAMINECTOMY/DECOMPRESSION MICRODISCECTOMY Right 08/23/2014   Procedure: LUMBAR LAMINECTOMY/DECOMPRESSION MICRODISCECTOMY 1 LEVEL;  Surgeon: Charlie Pitter, MD;  Location: [facility];  Service: Neurosurgery;  Laterality: Right;  LUMBAR LAMINECTOMY/DECOMPRESSION MICRODISCECTOMY 1 LEVEL LUMBAR 2-3   ovaries removed  11/22/2001   PARTIAL HYSTERECTOMY  11/23/1979   SHOULDER ARTHROSCOPY WITH DISTAL CLAVICLE RESECTION Left 08/06/2022   Procedure: SHOULDER ARTHROSCOPY WITH DISTAL CLAVICLE RESECTION;  Surgeon: Nicholes Stairs, MD;  Location: [facility];  Service: Orthopedics;  Laterality: Left;   SHOULDER ARTHROSCOPY WITH ROTATOR CUFF REPAIR Left 08/06/2022   Procedure: SHOULDER ARTHROSCOPY WITH ROTATOR CUFF REPAIR;  Surgeon: Nicholes Stairs, MD;  Location: [facility];  Service: Orthopedics;  Laterality:  Left;   SHOULDER ARTHROSCOPY WITH SUBACROMIAL DECOMPRESSION Left 08/06/2022   Procedure: SHOULDER ARTHROSCOPY WITH SUBACROMIAL [place] EXTENSIVE DEBRIDEMENT;  Surgeon: Nicholes Stairs, MD;  Location: [facility];  Service: Orthopedics;  Laterality: Left;   SPHINCTEROTOMY     spinahatomy  11/22/1990   TOTAL KNEE ARTHROPLASTY Right 11/05/2020   Procedure: RIGHT TOTAL KNEE ARTHROPLASTY;  Surgeon: Marybelle Killings, MD;  Location: [facility];  Service: Orthopedics;  Laterality: Right;   TUBAL LIGATION  11/22/1973   TUBAL LIGATION     Social History   Socioeconomic History  Marital status: Married    Spouse name: Not on file   Number of children: Not on file   Years of education: Not on file   Highest education level: Not on file  Occupational History   Not on file  Tobacco Use   Smoking status: Never    Passive exposure: Never   Smokeless tobacco: Never  Vaping Use   Vaping Use: Never used  Substance and Sexual Activity   Alcohol use: No   Drug use: No   Sexual activity: Not on file  Other Topics Concern   Not on file  Social History Narrative   ** Merged History Encounter **       Regularly exercises. Full Time.    Social Determinants of Health   Financial Resource Strain: Not on file  Food Insecurity: No Food Insecurity (02/11/2023)   Hunger Vital Sign    Worried About Running Out of Food in the Last Year: Never true    Ran Out of Food in the Last Year: Never true  Transportation Needs: No Transportation Needs (02/11/2023)   PRAPARE - Hydrologist (Medical): No    Lack of Transportation (Non-Medical): No  Physical Activity: Not on file  Stress: Not on file  Social Connections: Not on file   Family History  Problem Relation Age of Onset   Cancer Other    Heart failure Other    Colon cancer Brother    Allergies  Allergen Reactions   Cinnamon Anaphylaxis    Denies allergy on 08/06/22, states "It was an interaction when mixed  w/another med I no longer take"   Hydrocodone Other (See Comments)    Over sedation   Naproxen Anaphylaxis and Other (See Comments)    Throat swelling   Other Shortness Of Breath and Other (See Comments)    Local anesthesia Had asthma attack  08/06/22 tolerated amide local anesthetic in peripheral nerve block.   Oxycodone Other (See Comments)    Over sedation   Feldene [Piroxicam] Nausea Only   Lyrica [Pregabalin] Other (See Comments)    Altered mental status for prolonged period   Nsaids     Avoid due to history of gastic bypass   Phenergan [Promethazine Hcl] Other (See Comments)    triggers asthma attacks   Sulfa Antibiotics Other (See Comments)    (Yeast infection, per patient.)   Latex Rash and Other (See Comments)    "Blistery" rash.    Tape Rash and Other (See Comments)    Do not use adhesive tape; okay to use paper tape. 08/06/22 tolerated adhesive tape for peripheral IV   Prior to Admission medications   Medication Sig Start Date End Date Taking? Authorizing Provider  albuterol (VENTOLIN HFA) 108 (90 Base) MCG/ACT inhaler Inhale 2 puffs into the lungs 4 (four) times daily as needed for shortness of breath. 09/22/20  Yes [provider]  atorvastatin (LIPITOR) 10 MG tablet Take 10 mg by mouth daily. 08/15/20  Yes [provider]  BREO ELLIPTA 200-25 MCG/INH AEPB Inhale 2 puffs into the lungs daily. 08/22/20  Yes [provider]  Carboxymethylcellulose Sodium (THERATEARS) 0.25 % SOLN Place 1 drop into both eyes in the morning, at noon, and at bedtime.   Yes [provider]  cyclobenzaprine (FLEXERIL) 5 MG tablet Take 5 mg by mouth every 8 (eight) hours as needed for muscle spasms. 05/06/22  Yes [provider]  dicyclomine (BENTYL) 10 MG capsule Take 1 capsule (10 mg total)  by mouth 3 (three) times daily as needed for spasms. 01/17/23  Yes Carlan, Chelsea L, NP  famotidine (PEPCID) 40 MG tablet Take 40 mg by mouth daily.   Yes [provider]  furosemide (LASIX) 20 MG tablet Take 20 mg by mouth 2 (two) times daily. 10/23/20  Yes [provider]  hydrocortisone (ANUSOL-HC) 2.5 % rectal cream Place 1 Application rectally 2 (two) times daily. 01/17/23  Yes Carlan, Chelsea L, NP  hydrOXYzine (ATARAX) 25 MG tablet Take by mouth. 07/16/20  Yes [provider]  levothyroxine (SYNTHROID) 112 MCG tablet Take 112 mcg by mouth daily before breakfast. 09/22/20  Yes [provider]  Multiple Vitamin (MULTIVITAMIN PO) Take 2 tablets by mouth daily. ADEK vitamin chewable   Yes [provider]  Olopatadine HCl (PATADAY OP) Place 1 drop into both eyes in the morning and at bedtime.   Yes [provider]  ondansetron (ZOFRAN) 4 MG tablet TAKE 1 TABLET BY MOUTH EVERY 8 HOURS AS NEEDED FOR NAUSEA OR VOMITING 01/10/23  Yes Carlan, Chelsea L, NP  pantoprazole (PROTONIX) 40 MG tablet Take 40 mg by mouth daily. 10/23/20  Yes [provider]  PRESCRIPTION MEDICATION Injection in both eyes every 10 weeks for macular deg   Yes [provider]  spironolactone (ALDACTONE) 25 MG tablet Take 25 mg by mouth daily. 11/01/19  Yes [provider]  Calcium Carb-Cholecalciferol (CALCIUM 500+D PO) Take 1 tablet by mouth in the morning and at bedtime. Patient not taking: Reported on 01/17/2023    [provider]  Cholecalciferol (VITAMIN D) 50 MCG (2000 UT) tablet Take 2,000 Units by mouth daily. Patient not taking: Reported on 01/17/2023    [provider]  Cyanocobalamin (VITAMIN B-12) 5000 MCG SUBL Place 5,000 mcg under the tongue every other day. Patient not taking: Reported on 01/17/2023    [provider]  oxyCODONE-acetaminophen (PERCOCET) 7.5-325 MG tablet Take 1 tablet by mouth every 4 (four) hours as needed for severe pain. Patient not taking: Reported on 02/10/2023 08/06/22 08/06/23  Nicholes Stairs, MD   DG Wrist Complete Left  Result Date:  02/11/2023 CLINICAL DATA:  Pain after fall EXAM: LEFT WRIST - COMPLETE 4 VIEW COMPARISON:  None Available. FINDINGS: Osteopenia. No fracture or dislocation. Preserved joint spaces. Only mild hypertrophic degenerative changes of the first carpometacarpal joint. There is a well corticated density seen the level of the distal radius near the distal radioulnar joint. Possible the sequela of remote trauma or accessory ossicle. If there is persistent pain or further concern of a scaphoid injury, particularly with this level of osteopenia, treatment with follow-up imaging in 7-10 days is recommended as these injuries can be acutely x-ray occult. IMPRESSION: Osteopenia.  Degenerative change. Electronically Signed   By: Jill Side M.D.   On: 02/11/2023 17:57   CT Shoulder Right Wo Contrast  Result Date: 02/10/2023 CLINICAL DATA:  Trauma to the right shoulder. EXAM: CT OF THE UPPER RIGHT EXTREMITY WITHOUT CONTRAST TECHNIQUE: Multidetector CT imaging of the upper right extremity was performed according to the standard protocol. RADIATION DOSE REDUCTION: This exam was performed according to the departmental dose-optimization program which includes automated exposure control, adjustment of the mA and/or kV according to patient size and/or use of iterative reconstruction technique. COMPARISON:  Right shoulder radiograph dated 02/10/2023. FINDINGS: Bones/Joint/Cartilage There is comminuted nondisplaced fracture of the right humeral neck. No other acute fracture. No dislocation. The bones are osteopenic. Degenerative changes and spurring of the inferior bony glenoid.  No joint effusion. Ligaments Suboptimally assessed by CT. Muscles and Tendons No acute findings. Soft tissues Soft tissue edema.  No fluid collection or hematoma. IMPRESSION: Comminuted nondisplaced fracture of the right humeral neck. Electronically Signed   By: Anner Crete M.D.   On: 02/10/2023 19:23   CT Head Wo Contrast  Result Date:  02/10/2023 CLINICAL DATA:  Trauma EXAM: CT HEAD WITHOUT CONTRAST CT MAXILLOFACIAL WITHOUT CONTRAST CT CERVICAL SPINE WITHOUT CONTRAST TECHNIQUE: Multidetector CT imaging of the head, cervical spine, and maxillofacial structures were performed using the standard protocol without intravenous contrast. Multiplanar CT image reconstructions of the cervical spine and maxillofacial structures were also generated. RADIATION DOSE REDUCTION: This exam was performed according to the departmental dose-optimization program which includes automated exposure control, adjustment of the mA and/or kV according to patient size and/or use of iterative reconstruction technique. COMPARISON:  None Available. FINDINGS: CT HEAD FINDINGS Brain: There is periventricular white matter decreased attenuation consistent with small vessel ischemic changes. Gray-white differentiation is preserved. No acute intracranial hemorrhage, mass effect or shift. No hydrocephalus. Vascular: No hyperdense vessel or unexpected calcification. Skull: Normal. Negative for fracture or focal lesion. Other: None. CT MAXILLOFACIAL FINDINGS Osseous: Nasal septal deviation to the right. Bilateral nasal fractures displaced by few mm. Osseous structures are otherwise intact. Orbits: Negative. No traumatic or inflammatory finding. Sinuses: 3 cm left maxillary antrum cyst or polyp. Left-sided concha bullosa. No air-fluid levels or bony destructive processes. Soft tissues: Negative. CT CERVICAL SPINE FINDINGS Alignment: Normal. Skull base and vertebrae: No acute fracture. No primary bone lesion or focal pathologic process. Soft tissues and spinal canal: No prevertebral fluid or swelling. No visible canal hematoma. Disc levels: Disc space narrowing and marginal osteophyte formation C5-6 and C6-7. Osteoarthritis at C1-C2. Upper chest: Negative. IMPRESSION: 1. Periventricular white matter changes consistent with chronic small vessel ischemia. 2. No acute intracranial process  identified. 3. Nasal fractures. 4. Concha bullosa. 5. Left maxillary antrum cyst or polyp. 6. Degenerative disc disease. 7. No traumatic abnormalities of the cervical spine. Electronically Signed   By: Sammie Bench M.D.   On: 02/10/2023 17:37   CT Cervical Spine Wo Contrast  Result Date: 02/10/2023 CLINICAL DATA:  Trauma EXAM: CT HEAD WITHOUT CONTRAST CT MAXILLOFACIAL WITHOUT CONTRAST CT CERVICAL SPINE WITHOUT CONTRAST TECHNIQUE: Multidetector CT imaging of the head, cervical spine, and maxillofacial structures were performed using the standard protocol without intravenous contrast. Multiplanar CT image reconstructions of the cervical spine and maxillofacial structures were also generated. RADIATION DOSE REDUCTION: This exam was performed according to the departmental dose-optimization program which includes automated exposure control, adjustment of the mA and/or kV according to patient size and/or use of iterative reconstruction technique. COMPARISON:  None Available. FINDINGS: CT HEAD FINDINGS Brain: There is periventricular white matter decreased attenuation consistent with small vessel ischemic changes. Gray-white differentiation is preserved. No acute intracranial hemorrhage, mass effect or shift. No hydrocephalus. Vascular: No hyperdense vessel or unexpected calcification. Skull: Normal. Negative for fracture or focal lesion. Other: None. CT MAXILLOFACIAL FINDINGS Osseous: Nasal septal deviation to the right. Bilateral nasal fractures displaced by few mm. Osseous structures are otherwise intact. Orbits: Negative. No traumatic or inflammatory finding. Sinuses: 3 cm left maxillary antrum cyst or polyp. Left-sided concha bullosa. No air-fluid levels or bony destructive processes. Soft tissues: Negative. CT CERVICAL SPINE FINDINGS Alignment: Normal. Skull base and vertebrae: No acute fracture. No primary bone lesion or focal pathologic process. Soft tissues and spinal canal: No prevertebral fluid or  swelling. No visible canal hematoma. Disc  levels: Disc space narrowing and marginal osteophyte formation C5-6 and C6-7. Osteoarthritis at C1-C2. Upper chest: Negative. IMPRESSION: 1. Periventricular white matter changes consistent with chronic small vessel ischemia. 2. No acute intracranial process identified. 3. Nasal fractures. 4. Concha bullosa. 5. Left maxillary antrum cyst or polyp. 6. Degenerative disc disease. 7. No traumatic abnormalities of the cervical spine. Electronically Signed   By: Sammie Bench M.D.   On: 02/10/2023 17:37   CT Maxillofacial WO CM  Result Date: 02/10/2023 CLINICAL DATA:  Trauma EXAM: CT HEAD WITHOUT CONTRAST CT MAXILLOFACIAL WITHOUT CONTRAST CT CERVICAL SPINE WITHOUT CONTRAST TECHNIQUE: Multidetector CT imaging of the head, cervical spine, and maxillofacial structures were performed using the standard protocol without intravenous contrast. Multiplanar CT image reconstructions of the cervical spine and maxillofacial structures were also generated. RADIATION DOSE REDUCTION: This exam was performed according to the departmental dose-optimization program which includes automated exposure control, adjustment of the mA and/or kV according to patient size and/or use of iterative reconstruction technique. COMPARISON:  None Available. FINDINGS: CT HEAD FINDINGS Brain: There is periventricular white matter decreased attenuation consistent with small vessel ischemic changes. Gray-white differentiation is preserved. No acute intracranial hemorrhage, mass effect or shift. No hydrocephalus. Vascular: No hyperdense vessel or unexpected calcification. Skull: Normal. Negative for fracture or focal lesion. Other: None. CT MAXILLOFACIAL FINDINGS Osseous: Nasal septal deviation to the right. Bilateral nasal fractures displaced by few mm. Osseous structures are otherwise intact. Orbits: Negative. No traumatic or inflammatory finding. Sinuses: 3 cm left maxillary antrum cyst or polyp. Left-sided  concha bullosa. No air-fluid levels or bony destructive processes. Soft tissues: Negative. CT CERVICAL SPINE FINDINGS Alignment: Normal. Skull base and vertebrae: No acute fracture. No primary bone lesion or focal pathologic process. Soft tissues and spinal canal: No prevertebral fluid or swelling. No visible canal hematoma. Disc levels: Disc space narrowing and marginal osteophyte formation C5-6 and C6-7. Osteoarthritis at C1-C2. Upper chest: Negative. IMPRESSION: 1. Periventricular white matter changes consistent with chronic small vessel ischemia. 2. No acute intracranial process identified. 3. Nasal fractures. 4. Concha bullosa. 5. Left maxillary antrum cyst or polyp. 6. Degenerative disc disease. 7. No traumatic abnormalities of the cervical spine. Electronically Signed   By: Sammie Bench M.D.   On: 02/10/2023 17:37   DG Shoulder Right  Result Date: 02/10/2023 CLINICAL DATA:  Fall, pain EXAM: RIGHT SHOULDER - 2 VIEW COMPARISON:  None Available. FINDINGS: Osseous structures appear osteopenic. There is glenohumeral and acromioclavicular degenerative change with osteophytes. There is suggestion of a discontinuity in the cortex of the proximal humerus surgical neck which could indicate a fracture. Distal scapular dyskinesia could indicate a fractures well. These findings can be evaluated and confirmed with a CT. IMPRESSION: 1. Osteopenia. 2. Probable humeral neck fracture. 3. Possible fracture of the scapula. 4. CT recommended for further evaluation. Electronically Signed   By: Sammie Bench M.D.   On: 02/10/2023 16:57   DG Chest 1 View  Result Date: 02/10/2023 CLINICAL DATA:  Fall and pain EXAM: CHEST  1 VIEW COMPARISON:  06/11/2022 FINDINGS: Atypical appearance of the right humeral head, suboptimally evaluated. Please see dedicated radiographs. Midline trachea. Mild cardiomegaly. Mediastinal contours otherwise within normal limits. No pleural effusion or pneumothorax. No congestive failure. Clear  lungs. IMPRESSION: Cardiomegaly without congestive failure. Electronically Signed   By: Abigail Miyamoto M.D.   On: 02/10/2023 16:56   DG Knee Complete 4 Views Right  Result Date: 02/10/2023 CLINICAL DATA:  Pain fall EXAM: RIGHT KNEE - COMPLETE 4+ VIEW COMPARISON:  None Available. FINDINGS: Status post right total knee arthroplasty. No acute fracture dislocation or subluxation. No periprosthetic lucency to indicate loosening. No effusion. Osseous structures are osteopenic. No focal osteolytic or osteoblastic changes. IMPRESSION: Osteopenia. Otherwise unremarkable examination status post total knee arthroplasty. Electronically Signed   By: Sammie Bench M.D.   On: 02/10/2023 16:55   DG Ankle Complete Left  Result Date: 02/10/2023 CLINICAL DATA:  Fall and pain EXAM: LEFT ANKLE COMPLETE - 3+ VIEW COMPARISON:  09/29/2016 foot radiographs, without report FINDINGS: Diffuse soft tissue swelling. Mild to moderate osteoarthritis about the ankle. No acute fracture or dislocation. Tiny calcaneal spur. Base of fifth metatarsal and talar dome intact. IMPRESSION: Soft tissue swelling and degenerative change, without acute superimposed osseous finding. Electronically Signed   By: Abigail Miyamoto M.D.   On: 02/10/2023 16:54   DG Knee Complete 4 Views Left  Result Date: 02/10/2023 CLINICAL DATA:  Pain after fall EXAM: LEFT KNEE - COMPLETE 4 VIEW COMPARISON:  None Available. FINDINGS: Osteopenia there is comminuted mildly displaced fracture of the mid waist of the patella. Associated large joint effusion. Hyperostosis with osteophytes seen of all 3 compartments which are small. There is also joint space loss of the medial compartment. IMPRESSION: Slightly displaced midbody transverse patellar fracture. Large knee joint effusion. Osteopenia with mild degenerative changes. Electronically Signed   By: Jill Side M.D.   On: 02/10/2023 16:49   DG Wrist Complete Right  Result Date: 02/10/2023 CLINICAL DATA:  Fall.  Pain.  EXAM: RIGHT WRIST - COMPLETE 3+ VIEW COMPARISON:  Right hand radiographs 09/29/2016 FINDINGS: There is diffuse decreased bone mineralization. There is again severe thumb carpometacarpal joint space narrowing with subchondral sclerosis and peripheral osteophytosis. Mild medial greater than lateral triscaphe joint space narrowing and subchondral cystic changes. Mild-to-moderate joint space narrowing of the interphalangeal joints diffusely. No acute fracture is seen. No dislocation. IMPRESSION: 1. No acute fracture. 2. Severe thumb carpometacarpal osteoarthritis. Electronically Signed   By: Yvonne Kendall M.D.   On: 02/10/2023 16:48   Family History Reviewed and non-contributory, no pertinent history of problems with bleeding or anesthesia    Review of Systems No fevers or chills Occasional numbness or tingling right arm and hand No chest pain No shortness of breath History of GI tract issues followed by GI currently  Again history of liver disease she has had a weight loss procedure as well she has osteoporosis No headaches    OBJECTIVE  Vitals:Patient Vitals for the past 8 hrs:  BP Temp Temp src Pulse Resp SpO2  02/12/23 0457 124/62 98.8 F (37.1 C) Oral 67 20 94 %   General: Alert, no acute distress Cardiovascular: Warm extremities noted Respiratory: No cyanosis, no use of accessory musculature  Skin: No lesions in the area of chief complaint other than those listed below in MSK exam.  But she does say she has a new onset intermittent rash comes and goes unclear etiology   Neurologic: Sensation intact distally save for the below mentioned MSK exam Psychiatric: Patient is competent for consent with normal mood and affect Lymphatic: No swelling obvious and reported other than the area involved in the exam below Extremities  RUE: Tenderness swelling proximal right humerus decreased range of motion because of pain muscle tone normal without tremor no instability.  Right wrist tender  swollen painful range of motion at the wrist joint LUE: Tenderness none range of motion normal instability none muscle tone normal RLE: Tenderness none range of motion normal instability none muscle tone  normal LLE: Tenderness left knee with swelling, decreased range of motion secondary to pain, normal muscle tone, weakness on straight leg raise patient refuses to move the leg off the bed    Test Results Imaging these are my interpretations of the images The first image I saw was the left knee she has a transverse nondisplaced patella fracture which looks to be an incomplete fracture.  The second image I saw was a proximal humerus x-ray and shoulder x-ray which showed a nondisplaced proximal humerus fracture  We also reviewed the CT scan of the right shoulder showing that the shoulder is not dislocated the fracture is nondisplaced  The next x-ray I saw was one of the right wrist there is CMC arthritis with no evidence of fracture    Labs cbc Recent Labs    02/11/23 0523 02/12/23 0615  WBC 4.7 4.0  HGB 10.2* 10.2*  HCT 30.4* 29.5*  PLT 106* 105*    Labs inflam No results for input(s): "CRP" in the last 72 hours.  Invalid input(s): "ESR"  Labs coag Recent Labs    02/10/23 2234  INR 1.1    Recent Labs    02/11/23 0523 02/12/23 0615  NA 136 135  K 3.4* 4.0  CL 106 104  CO2 23 24  GLUCOSE 85 150*  BUN 18 16  CREATININE 1.06* 1.23*  CALCIUM 8.1* 8.1*

## 2023-02-12 NOTE — Progress Notes (Signed)
Patient is resting in her bed eating snack crackers. Patient slept most of the night during this shift. 3 prn medications given. Patient ordered a brace for her right wrist and brace was applied.  Patient tolerated well.

## 2023-02-12 NOTE — Progress Notes (Signed)
Subjective: Multiple complaints  Right shoulder pain/proximal humerus fracture  Right wrist pain/presumed sprain negative x-ray  Left wrist pain and weakness  Left knee pain/patella fracture  Rash right arm intermittent rash comes and goes noticed over the last week  Objective: Vital signs in last 24 hours: Temp:  [98.6 F (37 C)-99.1 F (37.3 C)] 98.8 F (37.1 C) (03/23 0457) Pulse Rate:  [67-73] 67 (03/23 0457) Resp:  [16-20] 20 (03/23 0457) BP: (107-124)/(54-62) 124/62 (03/23 0457) SpO2:  [94 %-98 %] 98 % (03/23 0955)  Intake/Output from previous day: 03/22 0701 - 03/23 0700 In: -  Out: 1801 [Urine:1800; Stool:1] Intake/Output this shift: Total I/O In: 240 [P.O.:240] Out: -   Recent Labs    02/10/23 2154 02/11/23 0523 02/12/23 0615  HGB 10.5* 10.2* 10.2*   Recent Labs    02/11/23 0523 02/12/23 0615  WBC 4.7 4.0  RBC 3.02* 2.94*  HCT 30.4* 29.5*  PLT 106* 105*   Recent Labs    02/11/23 0523 02/12/23 0615  NA 136 135  K 3.4* 4.0  CL 106 104  CO2 23 24  BUN 18 16  CREATININE 1.06* 1.23*  GLUCOSE 85 150*  CALCIUM 8.1* 8.1*   Recent Labs    02/10/23 2234  INR 1.1    Patient placed in brace at 10 degrees locked left knee  Wearing right wrist brace for sprain with negative x-ray    Assessment/Plan: Right proximal humerus fracture healing time 3 to 4 weeks, at 3 to 4 weeks after x-ray shows appropriate healing can start physical therapy  Left patella fracture healing time 6 to 12 weeks, bracing extension 6 weeks start active range of motion at 6 weeks through 12 weeks  Left wrist x-ray was performed: I agree with the report after reviewing the film that there is no evidence of fracture she does have degenerative changes nonspecific in the left wrist  Orthopedic follow-up weekly after discharge imaging right shoulder and left knee   Arther Abbott 02/12/2023, 12:09 PM

## 2023-02-13 DIAGNOSIS — S42294A Other nondisplaced fracture of upper end of right humerus, initial encounter for closed fracture: Secondary | ICD-10-CM | POA: Diagnosis not present

## 2023-02-13 LAB — CBC
HCT: 34.5 % — ABNORMAL LOW (ref 36.0–46.0)
Hemoglobin: 11.8 g/dL — ABNORMAL LOW (ref 12.0–15.0)
MCH: 34.1 pg — ABNORMAL HIGH (ref 26.0–34.0)
MCHC: 34.2 g/dL (ref 30.0–36.0)
MCV: 99.7 fL (ref 80.0–100.0)
Platelets: 118 10*3/uL — ABNORMAL LOW (ref 150–400)
RBC: 3.46 MIL/uL — ABNORMAL LOW (ref 3.87–5.11)
RDW: 13.5 % (ref 11.5–15.5)
WBC: 5 10*3/uL (ref 4.0–10.5)
nRBC: 0 % (ref 0.0–0.2)

## 2023-02-13 LAB — BASIC METABOLIC PANEL
Anion gap: 9 (ref 5–15)
BUN: 20 mg/dL (ref 8–23)
CO2: 27 mmol/L (ref 22–32)
Calcium: 8.6 mg/dL — ABNORMAL LOW (ref 8.9–10.3)
Chloride: 100 mmol/L (ref 98–111)
Creatinine, Ser: 1.13 mg/dL — ABNORMAL HIGH (ref 0.44–1.00)
GFR, Estimated: 54 mL/min — ABNORMAL LOW (ref >60)
Glucose, Bld: 105 mg/dL — ABNORMAL HIGH (ref 70–99)
Potassium: 4 mmol/L (ref 3.5–5.1)
Sodium: 136 mmol/L (ref 135–145)

## 2023-02-13 LAB — MAGNESIUM: Magnesium: 1.8 mg/dL (ref 1.7–2.4)

## 2023-02-13 MED ORDER — DIPHENHYDRAMINE HCL 12.5 MG/5ML PO ELIX
12.5000 mg | ORAL_SOLUTION | Freq: Four times a day (QID) | ORAL | Status: DC
  Administered 2023-02-13 – 2023-02-15 (×9): 12.5 mg via ORAL
  Filled 2023-02-13 (×10): qty 5

## 2023-02-13 NOTE — Plan of Care (Signed)
  Problem: Acute Rehab PT Goals(only PT should resolve) Goal: Pt Will Go Supine/Side To Sit Flowsheets (Taken 02/13/2023 1225) Pt will go Supine/Side to Sit: with minimal assist Goal: Pt Will Go Sit To Supine/Side Flowsheets (Taken 02/13/2023 1225) Pt will go Sit to Supine/Side: with minimal assist Goal: Pt Will Ambulate Flowsheets (Taken 02/13/2023 1225) Pt will Ambulate: (hemiwalker)  50 feet  with minimal assist  with other equipment (comment)

## 2023-02-13 NOTE — Progress Notes (Signed)
PROGRESS NOTE    Kimberly [name]  [id number] DOB: [birth year] DOA: 02/10/2023 PCP: Glenda Chroman, MD   Brief Narrative:  Kimberly [name] is a 67 y.o. female with past medical history significant for asthma, GERD, hypertension, hyperlipidemia, hypothyroidism, and cirrhosis 2/2 NASH who presents to the emergency department via EMS after sustaining a fall onto her face and right side and without LOC.  On arrival to ED, patient had nosebleed and pain of face, head, neck, both knees, and R arm.  Pulse was 59 and BP was 162/77 but patient was otherwise stable.  Lab workup in the ED was unremarkable except hemoglobin 10.5, with MCV 100.3 and platelets 106.  Multiple imaging studies were obtained, with R shoulder X-ray showing possible humeral neck and scapula fractures, L ankle X-ray with soft tissue swelling and without acute osseous findings, and L knee X-ray with displaced mid-body transverse patella fracture and large knee effusion.  CT of URE confirmed comminuted nondisplaced fracture of R humeral neck and CT head/maxillofacial/cervical spine showed no intracranial processes but nasal fractures and concha bullosa.  Patient was treated with IV fentanyl 50 mcg x 1, morphine, Zofran, and Tdap.  Orthopedic surgeon (Dr. Aline Brochure) was consulted and recommendations are for nonoperative intervention.  PT evaluation pending.  Hemoccult was positive and GI was consulted given new anemia and extensive GI history to evaluate for possible bleed.  Per GI, no need for inpatient evaluation at this time with stable hemoglobin levels noted.  Her positive occult was likely secondary to swallowed blood from epistaxis.  PT recommending SNF on discharge and she is currently awaiting placement.  Assessment & Plan:   Principal Problem:   Closed fracture of right proximal humerus Active Problems:   Mixed hyperlipidemia   Acquired hypothyroidism   Essential hypertension   Asthma in adult   Gastroesophageal reflux  disease without esophagitis   Left patella fracture   Accidental fall   GI bleed   Nasal fracture   Thrombocytopenia (HCC)   History of cirrhosis   Blood in stool  Assessment and Plan:   Nondisplaced fracture of R humeral neck Transverse fracture of L patella Accidental fall Osteoporosis Patient reports she has osteoporosis treated with a shot (likely bisphosphonate) supposed to be scheduled every 6 months, but her last one was in July.  Will need to resume treatment once appropriate. - Continue fall precautions - Continue morphine 2 mg every 2 hours as needed, cannot do acetaminophen scheduled due to cirrhosis - Tdap administered - Orthopedic surgeon (Dr. Laverta Baltimore) consulted with plans for now need for intervention.  Outpatient follow-up on x-rays weekly.   Nasal fracture Conservative treatment at this time with plan to consider ENT consult if concern for significant soft tissue swelling in critical areas or difficulty speaking, breathing.  Does have some numbness of the L side of face, which is expected in setting of soft tissue swelling.   GI bleed ruled out GI signed off with plan to follow-up outpatient in 4 weeks, no need for further inpatient workup Positive FOBT likely a result of swallowed blood from epistaxis   Thrombocytopenia-stable May be secondary to hepatic dysfunction given patient's history of cirrhosis, Plt 106 on admission. - Trend Plt daily   History of cirrhosis 2/2 NASH No known varices.  Of note, patient was scheduled for routine EGD and colonoscopy in July per outpatient GI.  She also has MCRP planned for April 2/2 to dilated common bile duct and elevated bilirubin w/ RUQ pain. - Lasix  and Spironolactone resumed   Macrocytic anemia MCV 100.3.  Anemia more likely related to GI bleed, but will work up macrocytosis. - Vitamin B12 and folate levels ordered   Carpal tunnel Patient inquires about carpal tunnel wrist brace for wrist pain, asked patient to  discuss with orthopedic surgeon when they evaluate her.   GERD - Continue PPI and H2B   Essential hypertension - Lasix and spironolactone held at this time due to soft BP, likely in setting of morphine use   Mixed hyperlipidemia - Continue statin   Asthma - Continue Ventolin, Breo Ellipta   Acquired hypothyroidism - Continue levothyroxine 112 mcg daily   DVT prophylaxis: SCDs, pharmacological PPx deferred in setting of possible GI bleed Code Status: Full Family Communication: None present Disposition Plan: Patient is from: Home Anticipated d/c is to: SNF  Anticipated d/c date is: TBD Patient currently: Pending orthopedic and GI evaluation for injuries, bleeding Admission Status is: Inpatient Remains inpatient appropriate because: Requires workup for possible GI bleed   Consultants:  Gastroenterology Orthopedic Surgery   Procedures:  None   Antimicrobials:  None  Subjective: Patient seen and evaluated today with no new acute complaints or concerns. No acute concerns or events noted overnight.  Objective: Vitals:   02/12/23 1415 02/12/23 2006 02/13/23 0454 02/13/23 1106  BP: 122/66 118/65 118/71   Pulse: 70 78 74   Resp: 16 18 18    Temp: 98.1 F (36.7 C) 98.3 F (36.8 C) (!) 97.5 F (36.4 C)   TempSrc: Oral     SpO2: 96% 94% 97% 97%    Intake/Output Summary (Last 24 hours) at 02/13/2023 1324 Last data filed at 02/13/2023 0900 Gross per 24 hour  Intake 480 ml  Output 1800 ml  Net -1320 ml   There were no vitals filed for this visit.  Examination:  General exam: Appears calm and comfortable  Respiratory system: Clear to auscultation. Respiratory effort normal. Cardiovascular system: S1 & S2 heard, RRR.  Gastrointestinal system: Abdomen is soft Central nervous system: Alert and awake Extremities: No edema, right arm in sling.  Left lower extremity in splint Skin: No significant lesions noted Psychiatry: Flat affect.    Data Reviewed: I have  personally reviewed following labs and imaging studies  CBC: Recent Labs  Lab 02/10/23 2154 02/11/23 0523 02/12/23 0615 02/13/23 0430  WBC 5.4 4.7 4.0 5.0  NEUTROABS 3.2  --   --   --   HGB 10.5* 10.2* 10.2* 11.8*  HCT 30.8* 30.4* 29.5* 34.5*  MCV 100.3* 100.7* 100.3* 99.7  PLT 106* 106* 105* [id number]*   Basic Metabolic Panel: Recent Labs  Lab 02/10/23 2119 02/11/23 0523 02/12/23 0615 02/13/23 0430  NA 138 136 135 136  K 3.6 3.4* 4.0 4.0  CL 109 106 104 100  CO2 20* 23 24 27   GLUCOSE 97 85 150* 105*  BUN 18 18 16 20   CREATININE 1.02* 1.06* 1.23* 1.13*  CALCIUM 8.3* 8.1* 8.1* 8.6*  MG  --  1.8 1.8 1.8  PHOS  --  3.9  --   --    GFR: CrCl cannot be calculated (Unknown ideal weight.). Liver Function Tests: Recent Labs  Lab 02/11/23 0523  AST 49*  ALT 28  ALKPHOS 99  BILITOT 2.3*  PROT 5.5*  ALBUMIN 3.2*   No results for input(s): "LIPASE", "AMYLASE" in the last 168 hours. No results for input(s): "AMMONIA" in the last 168 hours. Coagulation Profile: Recent Labs  Lab 02/10/23 2234  INR 1.1   Cardiac  Enzymes: No results for input(s): "CKTOTAL", "CKMB", "CKMBINDEX", "TROPONINI" in the last 168 hours. BNP (last 3 results) No results for input(s): "PROBNP" in the last 8760 hours. HbA1C: No results for input(s): "HGBA1C" in the last 72 hours. CBG: Recent Labs  Lab 02/10/23 2016 02/11/23 1534  GLUCAP 72 80   Lipid Profile: No results for input(s): "CHOL", "HDL", "LDLCALC", "TRIG", "CHOLHDL", "LDLDIRECT" in the last 72 hours. Thyroid Function Tests: No results for input(s): "TSH", "T4TOTAL", "FREET4", "T3FREE", "THYROIDAB" in the last 72 hours. Anemia Panel: Recent Labs    02/10/23 2154 02/11/23 1510  VITAMINB12 398  --   FOLATE 20.7  --   FERRITIN  --  177  TIBC  --  280  IRON  --  36   Sepsis Labs: No results for input(s): "PROCALCITON", "LATICACIDVEN" in the last 168 hours.  No results found for this or any previous visit (from the past 240  hour(s)).       Radiology Studies: DG Wrist Complete Left  Result Date: 02/11/2023 CLINICAL DATA:  Pain after fall EXAM: LEFT WRIST - COMPLETE 4 VIEW COMPARISON:  None Available. FINDINGS: Osteopenia. No fracture or dislocation. Preserved joint spaces. Only mild hypertrophic degenerative changes of the first carpometacarpal joint. There is a well corticated density seen the level of the distal radius near the distal radioulnar joint. Possible the sequela of remote trauma or accessory ossicle. If there is persistent pain or further concern of a scaphoid injury, particularly with this level of osteopenia, treatment with follow-up imaging in 7-10 days is recommended as these injuries can be acutely x-ray occult. IMPRESSION: Osteopenia.  Degenerative change. Electronically Signed   By: Jill Side M.D.   On: 02/11/2023 17:57        Scheduled Meds:  atorvastatin  10 mg Oral Daily   diphenhydrAMINE  12.5 mg Oral Q6H   famotidine  40 mg Oral Daily   fluticasone furoate-vilanterol  1 puff Inhalation Daily   furosemide  20 mg Oral BID   levothyroxine  112 mcg Oral Q0600   loratadine  10 mg Oral Daily   olopatadine  1 drop Both Eyes BID   pantoprazole (PROTONIX) IV  40 mg Intravenous Q24H   polyethylene glycol  17 g Oral Daily   spironolactone  25 mg Oral Daily     LOS: 2 days    Time spent: 35 minutes    Kimberly Ketcham Darleen Crocker, DO [facility]  If 7PM-7AM, please contact night-coverage [email or web address] 02/13/2023, 1:24 PM

## 2023-02-13 NOTE — Progress Notes (Signed)
Subjective: Itching  Rt sh pain   Lt knee pain   Objective: Vital signs in last 24 hours: Temp:  [97.5 F (36.4 C)-98.3 F (36.8 C)] 97.5 F (36.4 C) (03/24 0454) Pulse Rate:  [70-78] 74 (03/24 0454) Resp:  [16-18] 18 (03/24 0454) BP: (118-122)/(65-71) 118/71 (03/24 0454) SpO2:  [94 %-97 %] 97 % (03/24 0454)  Intake/Output from previous day: 03/23 0701 - 03/24 0700 In: 480 [P.O.:480] Out: 2800 [Urine:2800] Intake/Output this shift: Total I/O In: 240 [P.O.:240] Out: -   Recent Labs    02/10/23 2154 02/11/23 0523 02/12/23 0615 02/13/23 0430  HGB 10.5* 10.2* 10.2* 11.8*   Recent Labs    02/12/23 0615 02/13/23 0430  WBC 4.0 5.0  RBC 2.94* 3.46*  HCT 29.5* 34.5*  PLT 105* 118*   Recent Labs    02/12/23 0615 02/13/23 0430  NA 135 136  K 4.0 4.0  CL 104 100  CO2 24 27  BUN 16 20  CREATININE 1.23* 1.13*  GLUCOSE 150* 105*  CALCIUM 8.1* 8.6*   Recent Labs    02/10/23 2234  INR 1.1    Assessment/Plan: Rt prox hum frx  Left patella frx  Non surgical treatment advised   Fu  April 4  Need PT consult before d/c  Wbat left leg   Non wb right arm    Arther Abbott 02/13/2023, 10:21 AM

## 2023-02-13 NOTE — NC FL2 (Signed)
[place] LEVEL OF CARE FORM     IDENTIFICATION  Patient Name: Kimberly [name] Birthdate: May 11, 1956 Sex: female Admission Date (Current Location): 02/10/2023  [facility] and [place] Number:  [place] and Address:         Provider Number: [id number]  Attending Physician Name and Address:  Rodena Goldmann, DO  Relative Name and Phone Number:  Talina, Kastl (Spouse)  [phone] (Mobile)    Current Level of Care: Hospital Recommended Level of Care: [place] Prior Approval Number:    Date Approved/Denied:   PASRR Number: [id number]  Discharge Plan: SNF    Current Diagnoses: Patient Active Problem List   Diagnosis Date Noted   Blood in stool 02/12/2023   Closed fracture of right proximal humerus 02/11/2023   Left patella fracture 02/11/2023   Accidental fall 02/11/2023   GI bleed 02/11/2023   Nasal fracture 02/11/2023   Thrombocytopenia ([place]) 02/11/2023   History of cirrhosis 02/11/2023   Hemorrhoids 01/17/2023   Gastroesophageal reflux disease without esophagitis 01/17/2023   Diarrhea 05/17/2022   Lower abdominal pain 05/17/2022   Nausea without vomiting 05/17/2022   Unilateral primary osteoarthritis, left knee 02/18/2022   IBS (irritable bowel syndrome) 06/08/2021   Chronic diarrhea 06/08/2021   Irritable bowel syndrome with diarrhea 06/08/2021   Quadriceps weakness 03/19/2021   S/P total knee arthroplasty, right 12/18/2020   Morbid obesity ([place]) 02/08/2018   Pancytopenia ([place]) 04/09/2017   Liver cirrhosis secondary to NASH ([place]) 04/09/2017   DM type 2 causing vascular disease ([place]) 04/06/2017   Essential hypertension 04/06/2017   OSA (obstructive sleep apnea) 04/06/2017   Asthma in adult 04/06/2017   Obesity (BMI 30-39.9) 04/06/2017   Primary osteoarthritis of both hands 10/26/2016   Primary osteoarthritis of both feet 10/26/2016   COPD (chronic obstructive pulmonary disease) ([place]) 09/27/2016   Acquired  hypothyroidism 09/27/2016   Vertebral compression fracture ([place]) 09/27/2016   Fibromyalgia 09/27/2016   Restless leg syndrome 09/27/2016   Lumbar radiculitis 10/01/2014   Nerve pain 08/30/2014   Lumbar stenosis with neurogenic claudication 08/28/2014   Spondylosis of lumbar region without myelopathy or radiculopathy 08/23/2014   Lumbosacral stenosis with neurogenic claudication 08/23/2014   Vaginal discharge 06/22/2014   Chest pain 06/21/2014   Spinal stenosis 08/23/2011   Obstructive sleep apnea 08/23/2011   Diabetes ([place]) 08/12/2010   CAD 08/12/2010   Chronic diastolic heart failure ([place]) 08/12/2010   Mixed hyperlipidemia 06/23/2009   OVERWEIGHT/OBESITY 06/23/2009   Essential hypertension, benign 06/23/2009   CHEST PAIN-UNSPECIFIED 06/23/2009    Orientation RESPIRATION BLADDER Height & Weight     Self, Time, Place  (S) Other (Comment) (See DC summary) Continent Weight:   Height:     BEHAVIORAL SYMPTOMS/MOOD NEUROLOGICAL BOWEL NUTRITION STATUS      Continent Diet (See DC Summary)  AMBULATORY STATUS COMMUNICATION OF NEEDS Skin   Extensive Assist Verbally Skin abrasions, Bruising                       Personal Care Assistance Level of Assistance  Bathing, Feeding, Dressing Bathing Assistance: Maximum assistance Feeding assistance: Limited assistance Dressing Assistance: Maximum assistance     Functional Limitations Info  Sight, Hearing, Speech Sight Info: Adequate Hearing Info: Adequate Speech Info: Adequate    SPECIAL CARE FACTORS FREQUENCY  PT (By licensed PT)     PT Frequency: 5 X a week              Contractures Contractures Info: Not present  Additional Factors Info  Code Status, Allergies Code Status Info: Full Allergies Info: Cinnamon, Hydrocodone, Naproxen, Other, Oxycodone, Feldene (Piroxicam), Lyrica (Pregabalin), Nsaids, Phenergan (Promethazine Hcl), Sulfa Antibiotics, Latex, Tape           Current Medications (02/13/2023):  This is  the current hospital active medication list Current Facility-Administered Medications  Medication Dose Route Frequency Provider Last Rate Last Admin   acetaminophen (TYLENOL) tablet 650 mg  650 mg Oral Q6H PRN Adefeso, Oladapo, DO   650 mg at 02/13/23 0532   Or   acetaminophen (TYLENOL) suppository 650 mg  650 mg Rectal Q6H PRN Adefeso, Oladapo, DO       albuterol (VENTOLIN HFA) 108 (90 Base) MCG/ACT inhaler 2 puff  2 puff Inhalation QID PRN Adefeso, Oladapo, DO       atorvastatin (LIPITOR) tablet 10 mg  10 mg Oral Daily Adefeso, Oladapo, DO   10 mg at 02/13/23 0840   cyclobenzaprine (FLEXERIL) tablet 5 mg  5 mg Oral Q8H PRN Manuella Ghazi, Pratik D, DO   5 mg at 02/13/23 0843   diphenhydrAMINE (BENADRYL) 12.5 MG/5ML elixir 12.5 mg  12.5 mg Oral Q6H Carole Civil, MD   12.5 mg at 02/13/23 1204   famotidine (PEPCID) tablet 40 mg  40 mg Oral Daily Adefeso, Oladapo, DO   40 mg at 02/13/23 0840   fluticasone furoate-vilanterol (BREO ELLIPTA) 200-25 MCG/ACT 1 puff  1 puff Inhalation Daily Adefeso, Oladapo, DO   1 puff at 02/13/23 1106   furosemide (LASIX) tablet 20 mg  20 mg Oral BID Manuella Ghazi, Pratik D, DO   20 mg at 02/13/23 0840   hydrocortisone cream 1 %   Topical TID PRN Heath Lark D, DO   Given at 02/13/23 0841   levothyroxine (SYNTHROID) tablet 112 mcg  112 mcg Oral Q0600 Adefeso, Oladapo, DO   112 mcg at 02/13/23 0532   loratadine (CLARITIN) tablet 10 mg  10 mg Oral Daily Truett Mainland, DO   10 mg at 02/13/23 0840   morphine (PF) 2 MG/ML injection 2 mg  2 mg Intravenous Q2H PRN Adefeso, Oladapo, DO   2 mg at 02/13/23 0106   olopatadine (PATANOL) 0.1 % ophthalmic solution 1 drop  1 drop Both Eyes BID Manuella Ghazi, Pratik D, DO   1 drop at 02/13/23 0841   ondansetron (ZOFRAN) tablet 4 mg  4 mg Oral Q6H PRN Bernadette Hoit, DO       Or   ondansetron (ZOFRAN) injection 4 mg  4 mg Intravenous Q6H PRN Adefeso, Oladapo, DO   4 mg at 02/13/23 0543   oxyCODONE-acetaminophen (PERCOCET) 7.5-325 MG per tablet 1  tablet  1 tablet Oral Q4H PRN Manuella Ghazi, Pratik D, DO   1 tablet at 02/13/23 1043   pantoprazole (PROTONIX) injection 40 mg  40 mg Intravenous Q24H Adefeso, Oladapo, DO   40 mg at 02/13/23 0106   polyethylene glycol (MIRALAX / GLYCOLAX) packet 17 g  17 g Oral Daily Manuella Ghazi, Pratik D, DO   17 g at 02/13/23 0840   spironolactone (ALDACTONE) tablet 25 mg  25 mg Oral Daily Manuella Ghazi, Pratik D, DO   25 mg at 02/13/23 0840     Discharge Medications: Please see discharge summary for a list of discharge medications.  Relevant Imaging Results:  Relevant Lab Results:   Additional Information [id number]  Leo Rod, LCSW

## 2023-02-13 NOTE — Progress Notes (Signed)
INS AUTH started in [place].  Need to add bed offer.

## 2023-02-13 NOTE — TOC Progression Note (Signed)
Transition of Care [facility]) - Progression Note    Patient Details  Name: Kimberly [name] MRN: [id number] Date of Birth: May 13, 1956  Transition of Care [facility]) CM/SW [place], [place] Phone Number: 02/13/2023, 1:34 PM  Clinical Narrative:     LCSWA met with pt at bedside to asses/sdiscuss SNF. Pt in agreeement. Sent patient referral information to several facilities, pending bed offers.   Expected Discharge Plan: [place]    Expected Discharge Plan and Services       Living arrangements for the past 2 months: Single Family Home                                       Social Determinants of Health (SDOH) Interventions SDOH Screenings   Food Insecurity: No Food Insecurity (02/11/2023)  Transportation Needs: No Transportation Needs (02/11/2023)  Utilities: Not At Risk (02/11/2023)  Tobacco Use: Low Risk  (01/17/2023)    Readmission Risk Interventions     No data to display

## 2023-02-13 NOTE — Evaluation (Signed)
Physical Therapy Evaluation Patient Details Name: Kimberly [name] MRN: [id number] DOB: 02-22-1956 Today's Date: 02/13/2023  History of Present Illness  Kimberly [name] is a 67 y.o. female with medical history significant of asthma, GERD, hypertension, hyperlipidemia, cirrhosis who presents to the emergency department via EMS after sustaining a fall.  Patient states that she felt forward striking her face and she tried to use her right side to break the fall.  x-ray indicate a nasal fx, Rt proximal humeral fx and lt patella fx all non surgical.  Clinical Impression  PT very eager to work with therapist.  Overall did very well but mobility is significantly limited and she would benefit from SNF to improve her gait, strength and balance prior to going home.        Recommendations for follow up therapy are one component of a multi-disciplinary discharge planning process, led by the attending physician.  Recommendations may be updated based on patient status, additional functional criteria and insurance authorization.  Follow Up Recommendations Skilled nursing-short term rehab (<3 hours/day) Can patient physically be transported by private vehicle: No    Assistance Recommended at Discharge Frequent or constant Supervision/Assistance  Patient can return home with the following  A lot of help with walking and/or transfers;Assistance with cooking/housework;A lot of help with bathing/dressing/bathroom;Help with stairs or ramp for entrance;Assist for transportation    Equipment Recommendations Other (comment);BSC/3in1;Wheelchair cushion (measurements PT) (hemiwalker, BSC, shower chair and wheelchair if going home)  Recommendations for Other Services       Functional Status Assessment Patient has had a recent decline in their functional status and demonstrates the ability to make significant improvements in function in a reasonable and predictable amount of time.     Precautions / Restrictions  Precautions Precautions: Fall Required Braces or Orthoses: Knee Immobilizer - Left Knee Immobilizer - Right: On when out of bed or walking Knee Immobilizer - Left: On at all times Restrictions Weight Bearing Restrictions: Yes RUE Weight Bearing: Non weight bearing LLE Weight Bearing: Weight bearing as tolerated      Mobility  Bed Mobility Overal bed mobility: Needs Assistance Bed Mobility: Supine to Sit     Supine to sit: Mod assist          Transfers Overall transfer level: Needs assistance Equipment used: Hemi-walker Transfers: Sit to/from Stand, Bed to chair/wheelchair/BSC Sit to Stand: Mod assist Stand pivot transfers: Mod assist              Ambulation/Gait Ambulation/Gait assistance: Mod assist Gait Distance (Feet): 4 Feet Assistive device: Hemi-walker Gait Pattern/deviations: Decreased step length - right, Decreased step length - left   Gait velocity interpretation: <1.31 ft/sec, indicative of household ambulator      Financial trader Rankin (Stroke Patients Only)       Balance                                             Pertinent Vitals/Pain Pain Assessment Pain Assessment: 0-10 Pain Score: 5  Pain Descriptors / Indicators: Aching, Sore Pain Intervention(s): Limited activity within patient's tolerance    Home Living                          Prior Function  Hand Dominance        Extremity/Trunk Assessment        Lower Extremity Assessment Lower Extremity Assessment: LLE deficits/detail LLE Deficits / Details: generally 2/5 for hip       Communication      Cognition Arousal/Alertness: Awake/alert Behavior During Therapy: WFL for tasks assessed/performed Overall Cognitive Status: Within Functional Limits for tasks assessed                                          General Comments      Exercises      Assessment/Plan    PT Assessment Patient needs continued PT services  PT Problem List Decreased strength;Decreased activity tolerance;Decreased balance;Decreased mobility;Pain       PT Treatment Interventions DME instruction;Stair training;Therapeutic activities;Balance training;Gait training;Therapeutic exercise;Patient/family education    PT Goals (Current goals can be found in the Care Plan section)  Acute Rehab PT Goals PT Goal Formulation: With patient Time For Goal Achievement: 02/18/23 Potential to Achieve Goals: Good    Frequency Min 5X/week     Co-evaluation               AM-PAC PT "6 Clicks" Mobility  Outcome Measure Help needed turning from your back to your side while in a flat bed without using bedrails?: A Lot Help needed moving from lying on your back to sitting on the side of a flat bed without using bedrails?: A Lot Help needed moving to and from a bed to a chair (including a wheelchair)?: A Lot Help needed standing up from a chair using your arms (e.g., wheelchair or bedside chair)?: A Lot Help needed to walk in hospital room?: A Lot Help needed climbing 3-5 steps with a railing? : A Lot 6 Click Score: 12    End of Session Equipment Utilized During Treatment: Gait belt Activity Tolerance: Patient tolerated treatment well;Patient limited by pain;Patient limited by fatigue Patient left: in chair;with call bell/phone within reach;with nursing/sitter in room Nurse Communication: Mobility status PT Visit Diagnosis: Unsteadiness on feet (R26.81);Muscle weakness (generalized) (M62.81);History of falling (Z91.81)    Time: 0950-1030 PT Time Calculation (min) (ACUTE ONLY): 40 min   Charges:   PT Evaluation $PT Eval Moderate Complexity: 1 Mod PT Treatments $Gait Training: 8-22 mins        Rayetta Humphrey, PT CLT [phone]  02/13/2023, 12:28 PM

## 2023-02-14 ENCOUNTER — Telehealth: Payer: Self-pay | Admitting: Gastroenterology

## 2023-02-14 DIAGNOSIS — S42294A Other nondisplaced fracture of upper end of right humerus, initial encounter for closed fracture: Secondary | ICD-10-CM | POA: Diagnosis not present

## 2023-02-14 MED ORDER — ORAL CARE MOUTH RINSE: 15.0000 mL | OROMUCOSAL | Status: DC | PRN

## 2023-02-14 NOTE — Progress Notes (Signed)
Mobility Specialist Progress Note:   02/14/23 1344  Mobility  Activity Transferred from chair to bed  Level of Assistance Moderate assist, patient does 50-74%  Assistive Device Other (Comment) (Hemiwalker)  Distance Ambulated (ft) 3 ft  RUE Weight Bearing WBAT  LLE Weight Bearing WBAT  Activity Response Tolerated well  Mobility Referral Yes  $Mobility charge 1 Mobility   Pt requested assistance to transfer from C>B. Required ModA +2 to transfer and pivot. Tolerated well, c/o pain throughout. Left pt in bed in care of NT, all needs met, call bell in reach.   Royetta Crochet Mobility Specialist Please contact via Solicitor or  Rehab office at [phone]

## 2023-02-14 NOTE — Progress Notes (Signed)
PROGRESS NOTE    Kimberly [name]  [id number] DOB: Dec 21, 1955 DOA: 02/10/2023 PCP: Glenda Chroman, MD   Brief Narrative:  68 y.o. female with past medical history significant for asthma, GERD, hypertension, hyperlipidemia, hypothyroidism, and cirrhosis 2/2 NASH who presents to the emergency department via EMS after sustaining a fall onto her face and right side and without LOC.  On arrival to ED, patient had nosebleed and pain of face, head, neck, both knees, and R arm.  Pulse was 59 and BP was 162/77 but patient was otherwise stable.  Lab workup in the ED was unremarkable except hemoglobin 10.5, with MCV 100.3 and platelets 106.  Multiple imaging studies were obtained, with R shoulder X-ray showing possible humeral neck and scapula fractures, L ankle X-ray with soft tissue swelling and without acute osseous findings, and L knee X-ray with displaced mid-body transverse patella fracture and large knee effusion.  CT of [facility] confirmed comminuted nondisplaced fracture of R humeral neck and CT head/maxillofacial/cervical spine showed no intracranial processes but nasal fractures and concha bullosa.  Patient was treated with IV fentanyl 50 mcg x 1, morphine, Zofran, and Tdap.  Orthopedic surgeon (Dr. Aline Brochure) was consulted and recommendations are for nonoperative intervention.  PT evaluation pending.  Hemoccult was positive and GI was consulted given new anemia and extensive GI history to evaluate for possible bleed.  Per GI, no need for inpatient evaluation at this time with stable hemoglobin levels noted.  Her positive occult was likely secondary to swallowed blood from epistaxis.  PT recommending SNF on discharge and she is currently awaiting placement.    Assessment & Plan:  Principal Problem:   Closed fracture of right proximal humerus Active Problems:   Mixed hyperlipidemia   Acquired hypothyroidism   Essential hypertension   Asthma in adult   Gastroesophageal reflux disease without  esophagitis   Left patella fracture   Accidental fall   GI bleed   Nasal fracture   Thrombocytopenia (HCC)   History of cirrhosis   Blood in stool     Nondisplaced fracture of R humeral neck Transverse fracture of L patella Accidental fall Osteoporosis Patient reports she has osteoporosis treated with a shot (likely bisphosphonate) supposed to be scheduled every 6 months, but her last one was in July.  Will need to resume treatment once appropriate.  Continue fall precautions, morphine as needed. - Tdap administered - Orthopedic surgeon (Dr. Jolee Ewing) consulted with plans for now need for intervention.  Outpatient follow-up on x-rays weekly.   Nasal fracture Conservative treatment at this time with plan to consider ENT consult if concern for significant soft tissue swelling in critical areas or difficulty speaking, breathing.  Does have some numbness of the L side of face, which is expected in setting of soft tissue swelling.   GI bleed ruled out Seen by GI, FOBT positive negative epistaxis   Thrombocytopenia-stable Appears to be stable.  Secondary to cirrhosis   History of cirrhosis 2/2 NASH No known varices.  Of note, patient was scheduled for routine EGD and colonoscopy in July per outpatient GI.  She also has MCRP planned for April 2/2 to dilated common bile duct and elevated bilirubin w/ RUQ pain. - Lasix and Spironolactone resumed   Macrocytic anemia MCV 100.3.  Anemia more likely related to GI bleed, but will work up macrocytosis. - Vitamin B12 and folate levels ordered   Carpal tunnel Patient inquires about carpal tunnel wrist brace for wrist pain, asked patient to discuss with orthopedic surgeon when  they evaluate her.   GERD - Continue PPI and H2B   Essential hypertension - Lasix and spironolactone held at this time due to soft BP, likely in setting of morphine use   Mixed hyperlipidemia - Continue statin   Asthma - Continue Ventolin, Breo Ellipta    Acquired hypothyroidism - Continue levothyroxine 112 mcg daily      DVT prophylaxis: SCDs, pharmacological PPx deferred in setting of possible GI bleed Code Status: Full Family Communication: None present Disposition Plan: Patient is from: Home Anticipated d/c is to: SNF  Anticipated d/c date is: TBD  Admission Status is: Inpatient Discussed with TOC, anticipate placement tomorrow       There is no height or weight on file to calculate BMI.         Subjective: Seen and examined at bedside, patient does not any complaints besides pain from broken bones   Examination:  General exam: Appears calm and comfortable  Respiratory system: Clear to auscultation. Respiratory effort normal. Cardiovascular system: S1 & S2 heard, RRR. No JVD, murmurs, rubs, gallops or clicks. No pedal edema. Gastrointestinal system: Abdomen is nondistended, soft and nontender. No organomegaly or masses felt. Normal bowel sounds heard. Central nervous system: Alert and oriented. No focal neurological deficits. Extremities: Left lower extremity splint in place Skin: No rashes, lesions or ulcers Psychiatry: Judgement and insight appear normal. Mood & affect appropriate.     Objective: Vitals:   02/13/23 1338 02/13/23 1958 02/14/23 0516 02/14/23 0744  BP: 108/69 122/74 127/74   Pulse: 84 88 79   Resp:  12 16   Temp: 98.3 F (36.8 C) 98.4 F (36.9 C) 98.3 F (36.8 C)   TempSrc: Oral Oral Oral   SpO2: 96% 96% 96% 96%    Intake/Output Summary (Last 24 hours) at 02/14/2023 1237 Last data filed at 02/14/2023 0900 Gross per 24 hour  Intake 720 ml  Output 800 ml  Net -80 ml   There were no vitals filed for this visit.   Data Reviewed:   CBC: Recent Labs  Lab 02/10/23 2154 02/11/23 0523 02/12/23 0615 02/13/23 0430  WBC 5.4 4.7 4.0 5.0  NEUTROABS 3.2  --   --   --   HGB 10.5* 10.2* 10.2* 11.8*  HCT 30.8* 30.4* 29.5* 34.5*  MCV 100.3* 100.7* 100.3* 99.7  PLT 106* 106* 105* 118*    Basic Metabolic Panel: Recent Labs  Lab 02/10/23 2119 02/11/23 0523 02/12/23 0615 02/13/23 0430  NA 138 136 135 136  K 3.6 3.4* 4.0 4.0  CL 109 106 104 100  CO2 20* 23 24 27   GLUCOSE 97 85 150* 105*  BUN 18 18 16 20   CREATININE 1.02* 1.06* 1.23* 1.13*  CALCIUM 8.3* 8.1* 8.1* 8.6*  MG  --  1.8 1.8 1.8  PHOS  --  3.9  --   --    GFR: CrCl cannot be calculated (Unknown ideal weight.). Liver Function Tests: Recent Labs  Lab 02/11/23 0523  AST 49*  ALT 28  ALKPHOS 99  BILITOT 2.3*  PROT 5.5*  ALBUMIN 3.2*   No results for input(s): "LIPASE", "AMYLASE" in the last 168 hours. No results for input(s): "AMMONIA" in the last 168 hours. Coagulation Profile: Recent Labs  Lab 02/10/23 2234  INR 1.1   Cardiac Enzymes: No results for input(s): "CKTOTAL", "CKMB", "CKMBINDEX", "TROPONINI" in the last 168 hours. BNP (last 3 results) No results for input(s): "PROBNP" in the last 8760 hours. HbA1C: No results for input(s): "HGBA1C" in the last  72 hours. CBG: Recent Labs  Lab 02/10/23 2016 02/11/23 1534  GLUCAP 72 80   Lipid Profile: No results for input(s): "CHOL", "HDL", "LDLCALC", "TRIG", "CHOLHDL", "LDLDIRECT" in the last 72 hours. Thyroid Function Tests: No results for input(s): "TSH", "T4TOTAL", "FREET4", "T3FREE", "THYROIDAB" in the last 72 hours. Anemia Panel: Recent Labs    02/11/23 1510  FERRITIN 177  TIBC 280  IRON 36   Sepsis Labs: No results for input(s): "PROCALCITON", "LATICACIDVEN" in the last 168 hours.  No results found for this or any previous visit (from the past 240 hour(s)).       Radiology Studies: No results found.      Scheduled Meds:  atorvastatin  10 mg Oral Daily   diphenhydrAMINE  12.5 mg Oral Q6H   famotidine  40 mg Oral Daily   fluticasone furoate-vilanterol  1 puff Inhalation Daily   furosemide  20 mg Oral BID   levothyroxine  112 mcg Oral Q0600   loratadine  10 mg Oral Daily   olopatadine  1 drop Both Eyes BID    pantoprazole (PROTONIX) IV  40 mg Intravenous Q24H   polyethylene glycol  17 g Oral Daily   spironolactone  25 mg Oral Daily   Continuous Infusions:   LOS: 3 days   Time spent= 35 mins    Tranika Scholler Arsenio Loader, MD [facility]  If 7PM-7AM, please contact night-coverage  02/14/2023, 12:37 PM

## 2023-02-14 NOTE — TOC Progression Note (Signed)
Transition of Care [facility]) - Progression Note    Patient Details  Name: Kimberly [name] MRN: [id number] Date of Birth: 12/07/1955  Transition of Care [facility]) CM/SW Contact  Boneta Lucks, RN Phone Number: 02/14/2023, 1:18 PM  Clinical Narrative:   Discussed bed offers with patient. She wants [facility]. Auth still pending in [facility], they are needing clinicals. CMA sending clinicals and bed choice. MD updated.   Expected Discharge Plan: [place] Barriers to Discharge: Continued Medical Work up  Expected Discharge Plan and Services      Living arrangements for the past 2 months: Single Family Home                   Social Determinants of Health (SDOH) Interventions SDOH Screenings   Food Insecurity: No Food Insecurity (02/11/2023)  Transportation Needs: No Transportation Needs (02/11/2023)  Utilities: Not At Risk (02/11/2023)  Tobacco Use: Low Risk  (01/17/2023)    Readmission Risk Interventions     No data to display

## 2023-02-14 NOTE — Evaluation (Signed)
Occupational Therapy Evaluation Patient Details Name: Kimberly [name] MRN: [id number] DOB: 1956/02/16 Today's Date: 02/14/2023   History of Present Illness Kimberly [name] is a 67 y.o. female with medical history significant of asthma, GERD, hypertension, hyperlipidemia, cirrhosis who presents to the emergency department via EMS after sustaining a fall.  Patient states that she felt forward striking her face and she tried to use her right side to break the fall, she presented with nosebleed with pain on face head and neck as well as both knees and right arm. Patient denies loss of consciousness. (Per DO)   Clinical Impression   Pt agreeable to OT evaluation. Pt was mostly independent at baseline with PRN assist for IADL's. Pt presented today needing mod A for bed mobility and min to mod A for step pivot to chair with hemi-walker. Pt reports that her husband has a bad back and she is unsure if he can help her as much as she is needing. At this time pt will need mod A for most ADL tasks. Pt was left in the chair with call bell within reach and nursing notified. Pt will benefit from continued OT in the hospital and recommended venue below to increase strength, balance, and endurance for safe ADL's.        Recommendations for follow up therapy are one component of a multi-disciplinary discharge planning process, led by the attending physician.  Recommendations may be updated based on patient status, additional functional criteria and insurance authorization.   Assistance Recommended at Discharge Intermittent Supervision/Assistance  Patient can return home with the following A lot of help with walking and/or transfers;A lot of help with bathing/dressing/bathroom;Assistance with cooking/housework;Assistance with feeding;Assist for transportation;Help with stairs or ramp for entrance    Functional Status Assessment  Patient has had a recent decline in their functional status and demonstrates the  ability to make significant improvements in function in a reasonable and predictable amount of time.  Equipment Recommendations  None recommended by OT    Recommendations for Other Services       Precautions / Restrictions Precautions Precautions: Fall Required Braces or Orthoses: Knee Immobilizer - Left Knee Immobilizer - Right: On when out of bed or walking Knee Immobilizer - Left: On at all times Restrictions Weight Bearing Restrictions: Yes RUE Weight Bearing: Non weight bearing LLE Weight Bearing: Weight bearing as tolerated      Mobility Bed Mobility Overal bed mobility: Needs Assistance Bed Mobility: Supine to Sit     Supine to sit: Mod assist; HOB elevated      General bed mobility comments: Assist to swing L LE to EOB and single hand held to pull to sit.    Transfers Overall transfer level: Needs assistance Equipment used: Hemi-walker Transfers: Sit to/from Stand, Bed to chair/wheelchair/BSC Sit to Stand: Min assist, Mod assist     Step pivot transfers: Mod assist, Min assist     General transfer comment: Labored movement and much extended time.      Balance Overall balance assessment: Needs assistance Sitting-balance support: No upper extremity supported, Feet supported Sitting balance-Leahy Scale: Fair Sitting balance - Comments: fair to good at EOB   Standing balance support: Single extremity supported, During functional activity, Reliant on assistive device for balance Standing balance-Leahy Scale: Poor Standing balance comment: poor to fair with RW                           ADL either performed or assessed  with clinical judgement   ADL Overall ADL's : Needs assistance/impaired Eating/Feeding: Set up;Sitting   Grooming: Minimal assistance;Sitting   Upper Body Bathing: Moderate assistance;Sitting   Lower Body Bathing: Maximal assistance;Sitting/lateral leans   Upper Body Dressing : Moderate assistance;Sitting   Lower Body  Dressing: Maximal assistance;Sitting/lateral leans   Toilet Transfer: Minimal assistance;Moderate assistance;Stand-pivot (hemi walker) Toilet Transfer Details (indicate cue type and reason): Simulated via EOB to chair transfer. Toileting- Clothing Manipulation and Hygiene: Moderate assistance;Bed level;Sitting/lateral lean;Maximal assistance       Functional mobility during ADLs: Moderate assistance (hemi walker)       Vision Baseline Vision/History: 6 Macular Degeneration Ability to See in Adequate Light: 3 Highly impaired Patient Visual Report: No change from baseline Vision Assessment?: No apparent visual deficits Additional Comments: Other than severe deficits at baseline.                Pertinent Vitals/Pain Pain Assessment Pain Assessment: 0-10 Pain Score: 8  Pain Location: L knee, L hand, R arm. Pain Descriptors / Indicators: Sharp Pain Intervention(s): Limited activity within patient's tolerance, Monitored during session, Repositioned     Hand Dominance Left   Extremity/Trunk Assessment Upper Extremity Assessment Upper Extremity Assessment: RUE deficits/detail;LUE deficits/detail RUE Deficits / Details: In sling; NWB LUE Deficits / Details: Generally weak; pt reported pain in L hand and decreased grip strength. 4-/5 when tested for grip strength. LUE Coordination: WNL   Lower Extremity Assessment Lower Extremity Assessment: Defer to PT evaluation   Cervical / Trunk Assessment Cervical / Trunk Assessment: Normal   Communication Communication Communication: No difficulties   Cognition Arousal/Alertness: Awake/alert Behavior During Therapy: WFL for tasks assessed/performed Overall Cognitive Status: Within Functional Limits for tasks assessed                                                        Home Living Family/patient expects to be discharged to:: Private residence Living Arrangements: Alone Available Help at Discharge:  Family;Available 24 hours/day Type of Home: House Home Access: Stairs to enter [employer] of Steps: 1 Entrance Stairs-Rails: None Home Layout: One level     Bathroom Shower/Tub: Teacher, early years/pre: Standard     Home Equipment: Conservation officer, nature (2 wheels);Cane - quad          Prior Functioning/Environment Prior Level of Function : Needs assist       Physical Assist : ADLs (physical)   ADLs (physical): IADLs Mobility Comments: Ambualtes without AD; does not drive. ADLs Comments: Independent ADL's; PRN assist IADL's.        OT Problem List: Decreased strength;Decreased range of motion;Decreased activity tolerance;Impaired balance (sitting and/or standing);Impaired vision/perception;Decreased coordination;Pain;Impaired UE functional use      OT Treatment/Interventions: Self-care/ADL training;Therapeutic exercise;Therapeutic activities;Patient/family education;Balance training;DME and/or AE instruction;Visual/perceptual remediation/compensation    OT Goals(Current goals can be found in the care plan section) Acute Rehab OT Goals Patient Stated Goal: Open to rehab stay. OT Goal Formulation: With patient Time For Goal Achievement: 02/28/23 Potential to Achieve Goals: Good  OT Frequency: Min 2X/week                                   End of Session Equipment Utilized During Treatment: Other (comment) (hemi-walker) Nurse Communication: Mobility status  Activity Tolerance: Patient tolerated treatment well Patient left: in chair;with call bell/phone within reach  OT Visit Diagnosis: Unsteadiness on feet (R26.81);Other abnormalities of gait and mobility (R26.89);Muscle weakness (generalized) (M62.81)                Time: [id number] OT Time Calculation (min): 22 min Charges:  OT General Charges $OT Visit: 1 Visit OT Evaluation $OT Eval Low Complexity: 1 Low  Haidyn Chadderdon OT, MOT   Larey Seat 02/14/2023, 9:13  AM

## 2023-02-14 NOTE — Plan of Care (Signed)
  Problem: Acute Rehab OT Goals (only OT should resolve) Goal: Pt. Will Perform Grooming Flowsheets (Taken 02/14/2023 0916) Pt Will Perform Grooming:  sitting  with supervision Goal: Pt. Will Perform Upper Body Bathing Flowsheets (Taken 02/14/2023 0916) Pt Will Perform Upper Body Bathing:  sitting  with supervision Goal: Pt. Will Perform Lower Body Bathing Flowsheets (Taken 02/14/2023 0916) Pt Will Perform Lower Body Bathing:  with mod assist  sitting/lateral leans  with adaptive equipment  with min assist Goal: Pt. Will Perform Upper Body Dressing Flowsheets (Taken 02/14/2023 0916) Pt Will Perform Upper Body Dressing:  with supervision  sitting Goal: Pt. Will Perform Lower Body Dressing Flowsheets (Taken 02/14/2023 0916) Pt Will Perform Lower Body Dressing:  with min assist  with mod assist  sitting/lateral leans  with adaptive equipment Goal: Pt. Will Transfer To Toilet Flowsheets (Taken 02/14/2023 [phone]) Pt Will Transfer to Toilet:  with min guard assist  with supervision  stand pivot transfer Goal: Pt. Will Perform Toileting-Clothing Manipulation Flowsheets (Taken 02/14/2023 0916) Pt Will Perform Toileting - Clothing Manipulation and hygiene:  with min guard assist  sitting/lateral leans Goal: Pt/Caregiver Will Perform Home Exercise Program Flowsheets (Taken 02/14/2023 [id number]) Pt/caregiver will Perform Home Exercise Program:  Increased strength  Left upper extremity  Independently  Bocephus Cali OT, MOT

## 2023-02-14 NOTE — Telephone Encounter (Signed)
Kimberly [name]/Kimberly [name]:  Patient needs hospital follow-up in about 4 weeks. Any APP at this point, as it is important we follow-up with her due to recent hospitalization. Last saw [facility] as outpatient.

## 2023-02-15 ENCOUNTER — Encounter (HOSPITAL_COMMUNITY): Payer: Self-pay | Admitting: Internal Medicine

## 2023-02-15 DIAGNOSIS — W010XXA Fall on same level from slipping, tripping and stumbling without subsequent striking against object, initial encounter: Secondary | ICD-10-CM | POA: Diagnosis not present

## 2023-02-15 DIAGNOSIS — J069 Acute upper respiratory infection, unspecified: Secondary | ICD-10-CM | POA: Diagnosis not present

## 2023-02-15 DIAGNOSIS — S42294A Other nondisplaced fracture of upper end of right humerus, initial encounter for closed fracture: Secondary | ICD-10-CM | POA: Diagnosis not present

## 2023-02-15 DIAGNOSIS — E119 Type 2 diabetes mellitus without complications: Secondary | ICD-10-CM | POA: Diagnosis not present

## 2023-02-15 DIAGNOSIS — M79642 Pain in left hand: Secondary | ICD-10-CM | POA: Diagnosis not present

## 2023-02-15 DIAGNOSIS — I5032 Chronic diastolic (congestive) heart failure: Secondary | ICD-10-CM | POA: Diagnosis not present

## 2023-02-15 DIAGNOSIS — S022XXD Fracture of nasal bones, subsequent encounter for fracture with routine healing: Secondary | ICD-10-CM | POA: Diagnosis not present

## 2023-02-15 DIAGNOSIS — G629 Polyneuropathy, unspecified: Secondary | ICD-10-CM | POA: Diagnosis not present

## 2023-02-15 DIAGNOSIS — E039 Hypothyroidism, unspecified: Secondary | ICD-10-CM | POA: Diagnosis not present

## 2023-02-15 DIAGNOSIS — E785 Hyperlipidemia, unspecified: Secondary | ICD-10-CM | POA: Diagnosis not present

## 2023-02-15 DIAGNOSIS — M19041 Primary osteoarthritis, right hand: Secondary | ICD-10-CM | POA: Diagnosis not present

## 2023-02-15 DIAGNOSIS — I509 Heart failure, unspecified: Secondary | ICD-10-CM | POA: Diagnosis not present

## 2023-02-15 DIAGNOSIS — E1165 Type 2 diabetes mellitus with hyperglycemia: Secondary | ICD-10-CM | POA: Diagnosis not present

## 2023-02-15 DIAGNOSIS — R52 Pain, unspecified: Secondary | ICD-10-CM | POA: Diagnosis not present

## 2023-02-15 DIAGNOSIS — R509 Fever, unspecified: Secondary | ICD-10-CM | POA: Diagnosis not present

## 2023-02-15 DIAGNOSIS — H04123 Dry eye syndrome of bilateral lacrimal glands: Secondary | ICD-10-CM | POA: Diagnosis not present

## 2023-02-15 DIAGNOSIS — H3530 Unspecified macular degeneration: Secondary | ICD-10-CM | POA: Diagnosis not present

## 2023-02-15 DIAGNOSIS — S42201D Unspecified fracture of upper end of right humerus, subsequent encounter for fracture with routine healing: Secondary | ICD-10-CM | POA: Diagnosis not present

## 2023-02-15 DIAGNOSIS — K449 Diaphragmatic hernia without obstruction or gangrene: Secondary | ICD-10-CM | POA: Diagnosis not present

## 2023-02-15 DIAGNOSIS — U071 COVID-19: Secondary | ICD-10-CM | POA: Diagnosis not present

## 2023-02-15 DIAGNOSIS — Y92009 Unspecified place in unspecified non-institutional (private) residence as the place of occurrence of the external cause: Secondary | ICD-10-CM | POA: Diagnosis not present

## 2023-02-15 DIAGNOSIS — J45909 Unspecified asthma, uncomplicated: Secondary | ICD-10-CM | POA: Diagnosis not present

## 2023-02-15 DIAGNOSIS — R2681 Unsteadiness on feet: Secondary | ICD-10-CM | POA: Diagnosis not present

## 2023-02-15 DIAGNOSIS — M6281 Muscle weakness (generalized): Secondary | ICD-10-CM | POA: Diagnosis not present

## 2023-02-15 DIAGNOSIS — K649 Unspecified hemorrhoids: Secondary | ICD-10-CM | POA: Diagnosis not present

## 2023-02-15 DIAGNOSIS — M19072 Primary osteoarthritis, left ankle and foot: Secondary | ICD-10-CM | POA: Diagnosis not present

## 2023-02-15 DIAGNOSIS — R5381 Other malaise: Secondary | ICD-10-CM | POA: Diagnosis not present

## 2023-02-15 DIAGNOSIS — I10 Essential (primary) hypertension: Secondary | ICD-10-CM | POA: Diagnosis not present

## 2023-02-15 DIAGNOSIS — S82032D Displaced transverse fracture of left patella, subsequent encounter for closed fracture with routine healing: Secondary | ICD-10-CM | POA: Diagnosis not present

## 2023-02-15 DIAGNOSIS — R404 Transient alteration of awareness: Secondary | ICD-10-CM | POA: Diagnosis not present

## 2023-02-15 DIAGNOSIS — E782 Mixed hyperlipidemia: Secondary | ICD-10-CM | POA: Diagnosis not present

## 2023-02-15 DIAGNOSIS — S42214D Unspecified nondisplaced fracture of surgical neck of right humerus, subsequent encounter for fracture with routine healing: Secondary | ICD-10-CM | POA: Diagnosis not present

## 2023-02-15 DIAGNOSIS — K219 Gastro-esophageal reflux disease without esophagitis: Secondary | ICD-10-CM | POA: Diagnosis not present

## 2023-02-15 DIAGNOSIS — D649 Anemia, unspecified: Secondary | ICD-10-CM | POA: Diagnosis not present

## 2023-02-15 DIAGNOSIS — Z23 Encounter for immunization: Secondary | ICD-10-CM | POA: Diagnosis not present

## 2023-02-15 DIAGNOSIS — Z7401 Bed confinement status: Secondary | ICD-10-CM | POA: Diagnosis not present

## 2023-02-15 DIAGNOSIS — J449 Chronic obstructive pulmonary disease, unspecified: Secondary | ICD-10-CM | POA: Diagnosis not present

## 2023-02-15 DIAGNOSIS — M19071 Primary osteoarthritis, right ankle and foot: Secondary | ICD-10-CM | POA: Diagnosis not present

## 2023-02-15 DIAGNOSIS — I2510 Atherosclerotic heart disease of native coronary artery without angina pectoris: Secondary | ICD-10-CM | POA: Diagnosis not present

## 2023-02-15 DIAGNOSIS — R300 Dysuria: Secondary | ICD-10-CM | POA: Diagnosis not present

## 2023-02-15 DIAGNOSIS — S42291D Other displaced fracture of upper end of right humerus, subsequent encounter for fracture with routine healing: Secondary | ICD-10-CM | POA: Diagnosis not present

## 2023-02-15 DIAGNOSIS — M25532 Pain in left wrist: Secondary | ICD-10-CM | POA: Diagnosis not present

## 2023-02-15 DIAGNOSIS — K5900 Constipation, unspecified: Secondary | ICD-10-CM | POA: Diagnosis not present

## 2023-02-15 DIAGNOSIS — W19XXXD Unspecified fall, subsequent encounter: Secondary | ICD-10-CM | POA: Diagnosis not present

## 2023-02-15 DIAGNOSIS — E559 Vitamin D deficiency, unspecified: Secondary | ICD-10-CM | POA: Diagnosis not present

## 2023-02-15 DIAGNOSIS — R051 Acute cough: Secondary | ICD-10-CM | POA: Diagnosis not present

## 2023-02-15 DIAGNOSIS — M19042 Primary osteoarthritis, left hand: Secondary | ICD-10-CM | POA: Diagnosis not present

## 2023-02-15 DIAGNOSIS — R062 Wheezing: Secondary | ICD-10-CM | POA: Diagnosis not present

## 2023-02-15 DIAGNOSIS — M810 Age-related osteoporosis without current pathological fracture: Secondary | ICD-10-CM | POA: Diagnosis not present

## 2023-02-15 DIAGNOSIS — Z9181 History of falling: Secondary | ICD-10-CM | POA: Diagnosis not present

## 2023-02-15 DIAGNOSIS — S82035D Nondisplaced transverse fracture of left patella, subsequent encounter for closed fracture with routine healing: Secondary | ICD-10-CM | POA: Diagnosis not present

## 2023-02-15 DIAGNOSIS — N390 Urinary tract infection, site not specified: Secondary | ICD-10-CM | POA: Diagnosis not present

## 2023-02-15 DIAGNOSIS — H409 Unspecified glaucoma: Secondary | ICD-10-CM | POA: Diagnosis not present

## 2023-02-15 MED ORDER — POLYETHYLENE GLYCOL 3350 17 G PO PACK: 17.0000 g | PACK | Freq: Every day | ORAL | 0 refills | Status: DC

## 2023-02-15 MED ORDER — OXYCODONE-ACETAMINOPHEN 7.5-325 MG PO TABS: 1.0000 | ORAL_TABLET | ORAL | 0 refills | Status: DC | PRN

## 2023-02-15 MED ORDER — CYCLOBENZAPRINE HCL 5 MG PO TABS: 5.0000 mg | ORAL_TABLET | Freq: Three times a day (TID) | ORAL | 0 refills | Status: DC | PRN

## 2023-02-15 NOTE — Care Management Important Message (Signed)
Important Message  Patient Details  Name: Kimberly [name] MRN: [id number] Date of Birth: July 19, 1956   Medicare Important Message Given:  Yes     Tommy Medal 02/15/2023, 10:46 AM

## 2023-02-15 NOTE — Progress Notes (Signed)
Report called to [facility].

## 2023-02-15 NOTE — TOC Transition Note (Addendum)
Transition of Care [facility]) - CM/SW Discharge Note   Patient Details  Name: Kimberly [name] MRN: [id number] Date of Birth: 1956/05/05  [facility]) CM/SW Contact:  Boneta Lucks, RN Phone Number: 02/15/2023, 9:21 AM   Clinical Narrative:  Patient medically ready to discharge to [place].  DC summary sent, bed provided room 35. RN to call report. Medical necessity printed. TOC will call EMS when RN is ready  Insurance Authorization received  Plan ID - [id number], Auth # [id number] approved for SNF 02/14/2023-02/16/2023.Next review -02/16/2023. Patient will discharge to [facility] today. MD/RN updated   Final next level of care: Skilled Nursing Facility Barriers to Discharge: Barriers Resolved   Patient Goals and CMS Choice   Choice offered to / list presented to : Patient  Discharge Placement           Patient to be transferred to facility by: EMS   Patient and family notified of of transfer: 02/15/23  Discharge Plan and Services Additional resources added to the After Visit Summary for         Social Determinants of Health (SDOH) Interventions SDOH Screenings   Food Insecurity: No Food Insecurity (02/11/2023)  Transportation Needs: No Transportation Needs (02/11/2023)  Utilities: Not At Risk (02/11/2023)  Tobacco Use: Low Risk  (02/15/2023)     Readmission Risk Interventions    02/15/2023    9:21 AM  Readmission Risk Prevention Plan  Post Dischage Appt Complete  Medication Screening Complete  Transportation Screening Complete

## 2023-02-15 NOTE — Discharge Summary (Signed)
Physician Discharge Summary  PENINA SODER [id number] DOB: Jun 30, 1956 DOA: 02/10/2023  PCP: Glenda Chroman, MD  Admit date: 02/10/2023 Discharge date: 02/15/2023  Admitted From: Home Disposition:  SNF  Recommendations for Outpatient Follow-up:  Follow up with PCP in 1-2 weeks Please obtain BMP/CBC in one week your next doctors visit.  Weekly XR for hand and follow up with Dr Aline Brochure from ortho.   Discharge Condition: Stable CODE STATUS: Full Diet recommendation: Low Na  Brief/Interim Summary: 67 y.o. female with past medical history significant for asthma, GERD, hypertension, hyperlipidemia, hypothyroidism, and cirrhosis 2/2 NASH who presents to the emergency department via EMS after sustaining a fall onto her face and right side and without LOC.  On arrival to ED, patient had nosebleed and pain of face, head, neck, both knees, and R arm.  Pulse was 59 and BP was 162/77 but patient was otherwise stable.  Lab workup in the ED was unremarkable except hemoglobin 10.5, with MCV 100.3 and platelets 106.  Multiple imaging studies were obtained, with R shoulder X-ray showing possible humeral neck and scapula fractures, L ankle X-ray with soft tissue swelling and without acute osseous findings, and L knee X-ray with displaced mid-body transverse patella fracture and large knee effusion.  CT of URE confirmed comminuted nondisplaced fracture of R humeral neck and CT head/maxillofacial/cervical spine showed no intracranial processes but nasal fractures and concha bullosa.  Patient was treated with IV fentanyl 50 mcg x 1, morphine, Zofran, and Tdap.  Orthopedic surgeon (Dr. Aline Brochure) was consulted and recommendations are for nonoperative intervention.  PT evaluation pending.  Hemoccult was positive and GI was consulted given new anemia and extensive GI history to evaluate for possible bleed.  Per GI, no need for inpatient evaluation at this time with stable hemoglobin levels noted.  Her positive occult  was likely secondary to swallowed blood from epistaxis.  PT recommending SNF on discharge.    Nondisplaced fracture of R humeral neck Transverse fracture of L patella Accidental fall Osteoporosis Patient reports she has osteoporosis treated with a shot (likely bisphosphonate) supposed to be scheduled every 6 months, but her last one was in July.  Will need to resume treatment once appropriate.  Continue fall precautions, morphine as needed. - Tdap administered - Orthopedic surgeon (Dr. Jolee Ewing) consulted with plans for now need for intervention.  Outpatient follow-up on x-rays weekly.   Nasal fracture Conservative treatment at this time with plan to consider ENT consult if concern for significant soft tissue swelling in critical areas or difficulty speaking, breathing.  Does have some numbness of the L side of face, which is expected in setting of soft tissue swelling.   GI bleed ruled out Seen by GI, FOBT positive negative epistaxis   Thrombocytopenia-stable Appears to be stable.  Secondary to cirrhosis   History of cirrhosis 2/2 NASH No known varices.  Of note, patient was scheduled for routine EGD and colonoscopy in July per outpatient GI.  She also has MCRP planned for April 2/2 to dilated common bile duct and elevated bilirubin w/ RUQ pain. - Lasix and Spironolactone resumed   Macrocytic anemia MCV 100.3.  Anemia more likely related to GI bleed, but will work up macrocytosis. - Vitamin B12 and folate levels - normal   Carpal tunnel Patient inquires about carpal tunnel wrist brace for wrist pain, asked patient to discuss with orthopedic surgeon when they evaluate her.   GERD - Continue PPI and H2B   Essential hypertension - Lasix and spironolactone held at  this time due to soft BP, likely in setting of morphine use   Mixed hyperlipidemia - Continue statin   Asthma - Continue Ventolin, Breo Ellipta   Acquired hypothyroidism - Continue levothyroxine 112 mcg daily      Discharge Diagnoses:  Principal Problem:   Closed fracture of right proximal humerus Active Problems:   Mixed hyperlipidemia   Acquired hypothyroidism   Essential hypertension   Asthma in adult   Gastroesophageal reflux disease without esophagitis   Left patella fracture   Accidental fall   GI bleed   Nasal fracture   Thrombocytopenia (HCC)   History of cirrhosis   Blood in stool      Consultations: GI Ortho  Subjective: Doing ok no complaints.   Discharge Exam: Vitals:   02/15/23 0506 02/15/23 0718  BP: 112/64   Pulse: 74   Resp: 18   Temp: 98.9 F (37.2 C)   SpO2: 95% 95%   Vitals:   02/14/23 1300 02/14/23 2131 02/15/23 0506 02/15/23 0718  BP: (!) 142/79 119/69 112/64   Pulse: 80 78 74   Resp: 18 16 18    Temp: 99 F (37.2 C) 98.6 F (37 C) 98.9 F (37.2 C)   TempSrc: Axillary Oral Oral   SpO2: 97% 96% 95% 95%    General: Pt is alert, awake, not in acute distress Cardiovascular: RRR, S1/S2 +, no rubs, no gallops Respiratory: CTA bilaterally, no wheezing, no rhonchi Abdominal: Soft, NT, ND, bowel sounds + Extremities: no edema, no cyanosis  Discharge Instructions   Allergies as of 02/15/2023       Reactions   Cinnamon Anaphylaxis   Denies allergy on 08/06/22, states "It was an interaction when mixed w/another med I no longer take"   Hydrocodone Other (See Comments)   Over sedation   Naproxen Anaphylaxis, Other (See Comments)   Throat swelling   Other Shortness Of Breath, Other (See Comments)   Local anesthesia Had asthma attack 08/06/22 tolerated amide local anesthetic in peripheral nerve block.   Oxycodone Other (See Comments)   Over sedation   Feldene [piroxicam] Nausea Only   Lyrica [pregabalin] Other (See Comments)   Altered mental status for prolonged period   Nsaids    Avoid due to history of gastic bypass   Phenergan [promethazine Hcl] Other (See Comments)   triggers asthma attacks   Sulfa Antibiotics Other (See Comments)    (Yeast infection, per patient.)   Latex Rash, Other (See Comments)   "Blistery" rash.    Tape Rash, Other (See Comments)   Do not use adhesive tape; okay to use paper tape. 08/06/22 tolerated adhesive tape for peripheral IV        Medication List     TAKE these medications    albuterol 108 (90 Base) MCG/ACT inhaler Commonly known as: VENTOLIN HFA Inhale 2 puffs into the lungs 4 (four) times daily as needed for shortness of breath.   atorvastatin 10 MG tablet Commonly known as: LIPITOR Take 10 mg by mouth daily.   Breo Ellipta 200-25 MCG/ACT Aepb Generic drug: fluticasone furoate-vilanterol Inhale 2 puffs into the lungs daily.   CALCIUM 500+D PO Take 1 tablet by mouth in the morning and at bedtime.   cyclobenzaprine 5 MG tablet Commonly known as: FLEXERIL Take 1 tablet (5 mg total) by mouth every 8 (eight) hours as needed for muscle spasms.   dicyclomine 10 MG capsule Commonly known as: BENTYL Take 1 capsule (10 mg total) by mouth 3 (three) times daily as needed for  spasms.   famotidine 40 MG tablet Commonly known as: PEPCID Take 40 mg by mouth daily.   furosemide 20 MG tablet Commonly known as: LASIX Take 20 mg by mouth 2 (two) times daily.   hydrocortisone 2.5 % rectal cream Commonly known as: ANUSOL-HC Place 1 Application rectally 2 (two) times daily.   hydrOXYzine 25 MG tablet Commonly known as: ATARAX Take by mouth.   levothyroxine 112 MCG tablet Commonly known as: SYNTHROID Take 112 mcg by mouth daily before breakfast.   MULTIVITAMIN PO Take 2 tablets by mouth daily. ADEK vitamin chewable   ondansetron 4 MG tablet Commonly known as: ZOFRAN TAKE 1 TABLET BY MOUTH EVERY 8 HOURS AS NEEDED FOR NAUSEA OR VOMITING   oxyCODONE-acetaminophen 7.5-325 MG tablet Commonly known as: Percocet Take 1 tablet by mouth every 4 (four) hours as needed for severe pain.   pantoprazole 40 MG tablet Commonly known as: PROTONIX Take 40 mg by mouth daily.   PATADAY  OP Place 1 drop into both eyes in the morning and at bedtime.   polyethylene glycol 17 g packet Commonly known as: MIRALAX / GLYCOLAX Take 17 g by mouth daily. Start taking on: February 16, 2023   PRESCRIPTION MEDICATION Injection in both eyes every 10 weeks for macular deg   spironolactone 25 MG tablet Commonly known as: ALDACTONE Take 25 mg by mouth daily.   Theratears 0.25 % Soln Generic drug: Carboxymethylcellulose Sodium Place 1 drop into both eyes in the morning, at noon, and at bedtime.   Vitamin B-12 5000 MCG Subl Place 5,000 mcg under the tongue every other day.   Vitamin D 50 MCG (2000 UT) tablet Take 2,000 Units by mouth daily.        Contact information for after-discharge care     Destination     [facility] .   Service: Skilled Nursing Contact information: [address] [place] [phone]                    Allergies  Allergen Reactions   Cinnamon Anaphylaxis    Denies allergy on 08/06/22, states "It was an interaction when mixed w/another med I no longer take"   Hydrocodone Other (See Comments)    Over sedation   Naproxen Anaphylaxis and Other (See Comments)    Throat swelling   Other Shortness Of Breath and Other (See Comments)    Local anesthesia Had asthma attack  08/06/22 tolerated amide local anesthetic in peripheral nerve block.   Oxycodone Other (See Comments)    Over sedation   Feldene [Piroxicam] Nausea Only   Lyrica [Pregabalin] Other (See Comments)    Altered mental status for prolonged period   Nsaids     Avoid due to history of gastic bypass   Phenergan [Promethazine Hcl] Other (See Comments)    triggers asthma attacks   Sulfa Antibiotics Other (See Comments)    (Yeast infection, per patient.)   Latex Rash and Other (See Comments)    "Blistery" rash.    Tape Rash and Other (See Comments)    Do not use adhesive tape; okay to use  paper tape. 08/06/22 tolerated adhesive tape for peripheral IV    You were cared for by a hospitalist during your hospital stay. If you have any questions about your discharge medications or the care you received while you were in the hospital after you are discharged, you can call the unit and asked to speak  with the hospitalist on call if the hospitalist that took care of you is not available. Once you are discharged, your primary care physician will handle any further medical issues. Please note that no refills for any discharge medications will be authorized once you are discharged, as it is imperative that you return to your primary care physician (or establish a relationship with a primary care physician if you do not have one) for your aftercare needs so that they can reassess your need for medications and monitor your lab values.  You were cared for by a hospitalist during your hospital stay. If you have any questions about your discharge medications or the care you received while you were in the hospital after you are discharged, you can call the unit and asked to speak with the hospitalist on call if the hospitalist that took care of you is not available. Once you are discharged, your primary care physician will handle any further medical issues. Please note that NO REFILLS for any discharge medications will be authorized once you are discharged, as it is imperative that you return to your primary care physician (or establish a relationship with a primary care physician if you do not have one) for your aftercare needs so that they can reassess your need for medications and monitor your lab values.  Please request your Prim.MD to go over all Hospital Tests and Procedure/Radiological results at the follow up, please get all Hospital records sent to your Prim MD by signing hospital release before you go home.  Get CBC, CMP, 2 view Chest X ray checked  by Primary MD during your next visit or SNF MD in  5-7 days ( we routinely change or add medications that can affect your baseline labs and fluid status, therefore we recommend that you get the mentioned basic workup next visit with your PCP, your PCP may decide not to get them or add new tests based on their clinical decision)  On your next visit with your primary care physician please Get Medicines reviewed and adjusted.  If you experience worsening of your admission symptoms, develop shortness of breath, life threatening emergency, suicidal or homicidal thoughts you must seek medical attention immediately by calling 911 or calling your MD immediately  if symptoms less severe.  You Must read complete instructions/literature along with all the possible adverse reactions/side effects for all the Medicines you take and that have been prescribed to you. Take any new Medicines after you have completely understood and accpet all the possible adverse reactions/side effects.   Do not drive, operate heavy machinery, perform activities at heights, swimming or participation in water activities or provide baby sitting services if your were admitted for syncope or siezures until you have seen by Primary MD or a Neurologist and advised to do so again.  Do not drive when taking Pain medications.   Procedures/Studies: DG Wrist Complete Left  Result Date: 02/11/2023 CLINICAL DATA:  Pain after fall EXAM: LEFT WRIST - COMPLETE 4 VIEW COMPARISON:  None Available. FINDINGS: Osteopenia. No fracture or dislocation. Preserved joint spaces. Only mild hypertrophic degenerative changes of the first carpometacarpal joint. There is a well corticated density seen the level of the distal radius near the distal radioulnar joint. Possible the sequela of remote trauma or accessory ossicle. If there is persistent pain or further concern of a scaphoid injury, particularly with this level of osteopenia, treatment with follow-up imaging in 7-10 days is recommended as these injuries can  be acutely x-ray occult.  IMPRESSION: Osteopenia.  Degenerative change. Electronically Signed   By: Jill Side M.D.   On: 02/11/2023 17:57   CT Shoulder Right Wo Contrast  Result Date: 02/10/2023 CLINICAL DATA:  Trauma to the right shoulder. EXAM: CT OF THE UPPER RIGHT EXTREMITY WITHOUT CONTRAST TECHNIQUE: Multidetector CT imaging of the upper right extremity was performed according to the standard protocol. RADIATION DOSE REDUCTION: This exam was performed according to the departmental dose-optimization program which includes automated exposure control, adjustment of the mA and/or kV according to patient size and/or use of iterative reconstruction technique. COMPARISON:  Right shoulder radiograph dated 02/10/2023. FINDINGS: Bones/Joint/Cartilage There is comminuted nondisplaced fracture of the right humeral neck. No other acute fracture. No dislocation. The bones are osteopenic. Degenerative changes and spurring of the inferior bony glenoid. No joint effusion. Ligaments Suboptimally assessed by CT. Muscles and Tendons No acute findings. Soft tissues Soft tissue edema.  No fluid collection or hematoma. IMPRESSION: Comminuted nondisplaced fracture of the right humeral neck. Electronically Signed   By: Anner Crete M.D.   On: 02/10/2023 19:23   CT Head Wo Contrast  Result Date: 02/10/2023 CLINICAL DATA:  Trauma EXAM: CT HEAD WITHOUT CONTRAST CT MAXILLOFACIAL WITHOUT CONTRAST CT CERVICAL SPINE WITHOUT CONTRAST TECHNIQUE: Multidetector CT imaging of the head, cervical spine, and maxillofacial structures were performed using the standard protocol without intravenous contrast. Multiplanar CT image reconstructions of the cervical spine and maxillofacial structures were also generated. RADIATION DOSE REDUCTION: This exam was performed according to the departmental dose-optimization program which includes automated exposure control, adjustment of the mA and/or kV according to patient size and/or use of iterative  reconstruction technique. COMPARISON:  None Available. FINDINGS: CT HEAD FINDINGS Brain: There is periventricular white matter decreased attenuation consistent with small vessel ischemic changes. Gray-white differentiation is preserved. No acute intracranial hemorrhage, mass effect or shift. No hydrocephalus. Vascular: No hyperdense vessel or unexpected calcification. Skull: Normal. Negative for fracture or focal lesion. Other: None. CT MAXILLOFACIAL FINDINGS Osseous: Nasal septal deviation to the right. Bilateral nasal fractures displaced by few mm. Osseous structures are otherwise intact. Orbits: Negative. No traumatic or inflammatory finding. Sinuses: 3 cm left maxillary antrum cyst or polyp. Left-sided concha bullosa. No air-fluid levels or bony destructive processes. Soft tissues: Negative. CT CERVICAL SPINE FINDINGS Alignment: Normal. Skull base and vertebrae: No acute fracture. No primary bone lesion or focal pathologic process. Soft tissues and spinal canal: No prevertebral fluid or swelling. No visible canal hematoma. Disc levels: Disc space narrowing and marginal osteophyte formation C5-6 and C6-7. Osteoarthritis at C1-C2. Upper chest: Negative. IMPRESSION: 1. Periventricular white matter changes consistent with chronic small vessel ischemia. 2. No acute intracranial process identified. 3. Nasal fractures. 4. Concha bullosa. 5. Left maxillary antrum cyst or polyp. 6. Degenerative disc disease. 7. No traumatic abnormalities of the cervical spine. Electronically Signed   By: Sammie Bench M.D.   On: 02/10/2023 17:37   CT Cervical Spine Wo Contrast  Result Date: 02/10/2023 CLINICAL DATA:  Trauma EXAM: CT HEAD WITHOUT CONTRAST CT MAXILLOFACIAL WITHOUT CONTRAST CT CERVICAL SPINE WITHOUT CONTRAST TECHNIQUE: Multidetector CT imaging of the head, cervical spine, and maxillofacial structures were performed using the standard protocol without intravenous contrast. Multiplanar CT image reconstructions of the  cervical spine and maxillofacial structures were also generated. RADIATION DOSE REDUCTION: This exam was performed according to the departmental dose-optimization program which includes automated exposure control, adjustment of the mA and/or kV according to patient size and/or use of iterative reconstruction technique. COMPARISON:  None Available. FINDINGS: CT  HEAD FINDINGS Brain: There is periventricular white matter decreased attenuation consistent with small vessel ischemic changes. Gray-white differentiation is preserved. No acute intracranial hemorrhage, mass effect or shift. No hydrocephalus. Vascular: No hyperdense vessel or unexpected calcification. Skull: Normal. Negative for fracture or focal lesion. Other: None. CT MAXILLOFACIAL FINDINGS Osseous: Nasal septal deviation to the right. Bilateral nasal fractures displaced by few mm. Osseous structures are otherwise intact. Orbits: Negative. No traumatic or inflammatory finding. Sinuses: 3 cm left maxillary antrum cyst or polyp. Left-sided concha bullosa. No air-fluid levels or bony destructive processes. Soft tissues: Negative. CT CERVICAL SPINE FINDINGS Alignment: Normal. Skull base and vertebrae: No acute fracture. No primary bone lesion or focal pathologic process. Soft tissues and spinal canal: No prevertebral fluid or swelling. No visible canal hematoma. Disc levels: Disc space narrowing and marginal osteophyte formation C5-6 and C6-7. Osteoarthritis at C1-C2. Upper chest: Negative. IMPRESSION: 1. Periventricular white matter changes consistent with chronic small vessel ischemia. 2. No acute intracranial process identified. 3. Nasal fractures. 4. Concha bullosa. 5. Left maxillary antrum cyst or polyp. 6. Degenerative disc disease. 7. No traumatic abnormalities of the cervical spine. Electronically Signed   By: Sammie Bench M.D.   On: 02/10/2023 17:37   CT Maxillofacial WO CM  Result Date: 02/10/2023 CLINICAL DATA:  Trauma EXAM: CT HEAD WITHOUT  CONTRAST CT MAXILLOFACIAL WITHOUT CONTRAST CT CERVICAL SPINE WITHOUT CONTRAST TECHNIQUE: Multidetector CT imaging of the head, cervical spine, and maxillofacial structures were performed using the standard protocol without intravenous contrast. Multiplanar CT image reconstructions of the cervical spine and maxillofacial structures were also generated. RADIATION DOSE REDUCTION: This exam was performed according to the departmental dose-optimization program which includes automated exposure control, adjustment of the mA and/or kV according to patient size and/or use of iterative reconstruction technique. COMPARISON:  None Available. FINDINGS: CT HEAD FINDINGS Brain: There is periventricular white matter decreased attenuation consistent with small vessel ischemic changes. Gray-white differentiation is preserved. No acute intracranial hemorrhage, mass effect or shift. No hydrocephalus. Vascular: No hyperdense vessel or unexpected calcification. Skull: Normal. Negative for fracture or focal lesion. Other: None. CT MAXILLOFACIAL FINDINGS Osseous: Nasal septal deviation to the right. Bilateral nasal fractures displaced by few mm. Osseous structures are otherwise intact. Orbits: Negative. No traumatic or inflammatory finding. Sinuses: 3 cm left maxillary antrum cyst or polyp. Left-sided concha bullosa. No air-fluid levels or bony destructive processes. Soft tissues: Negative. CT CERVICAL SPINE FINDINGS Alignment: Normal. Skull base and vertebrae: No acute fracture. No primary bone lesion or focal pathologic process. Soft tissues and spinal canal: No prevertebral fluid or swelling. No visible canal hematoma. Disc levels: Disc space narrowing and marginal osteophyte formation C5-6 and C6-7. Osteoarthritis at C1-C2. Upper chest: Negative. IMPRESSION: 1. Periventricular white matter changes consistent with chronic small vessel ischemia. 2. No acute intracranial process identified. 3. Nasal fractures. 4. Concha bullosa. 5. Left  maxillary antrum cyst or polyp. 6. Degenerative disc disease. 7. No traumatic abnormalities of the cervical spine. Electronically Signed   By: Sammie Bench M.D.   On: 02/10/2023 17:37   DG Shoulder Right  Result Date: 02/10/2023 CLINICAL DATA:  Fall, pain EXAM: RIGHT SHOULDER - 2 VIEW COMPARISON:  None Available. FINDINGS: Osseous structures appear osteopenic. There is glenohumeral and acromioclavicular degenerative change with osteophytes. There is suggestion of a discontinuity in the cortex of the proximal humerus surgical neck which could indicate a fracture. Distal scapular dyskinesia could indicate a fractures well. These findings can be evaluated and confirmed with a CT. IMPRESSION: 1. Osteopenia.  2. Probable humeral neck fracture. 3. Possible fracture of the scapula. 4. CT recommended for further evaluation. Electronically Signed   By: Sammie Bench M.D.   On: 02/10/2023 16:57   DG Chest 1 View  Result Date: 02/10/2023 CLINICAL DATA:  Fall and pain EXAM: CHEST  1 VIEW COMPARISON:  06/11/2022 FINDINGS: Atypical appearance of the right humeral head, suboptimally evaluated. Please see dedicated radiographs. Midline trachea. Mild cardiomegaly. Mediastinal contours otherwise within normal limits. No pleural effusion or pneumothorax. No congestive failure. Clear lungs. IMPRESSION: Cardiomegaly without congestive failure. Electronically Signed   By: Abigail Miyamoto M.D.   On: 02/10/2023 16:56   DG Knee Complete 4 Views Right  Result Date: 02/10/2023 CLINICAL DATA:  Pain fall EXAM: RIGHT KNEE - COMPLETE 4+ VIEW COMPARISON:  None Available. FINDINGS: Status post right total knee arthroplasty. No acute fracture dislocation or subluxation. No periprosthetic lucency to indicate loosening. No effusion. Osseous structures are osteopenic. No focal osteolytic or osteoblastic changes. IMPRESSION: Osteopenia. Otherwise unremarkable examination status post total knee arthroplasty. Electronically Signed   By:  Sammie Bench M.D.   On: 02/10/2023 16:55   DG Ankle Complete Left  Result Date: 02/10/2023 CLINICAL DATA:  Fall and pain EXAM: LEFT ANKLE COMPLETE - 3+ VIEW COMPARISON:  09/29/2016 foot radiographs, without report FINDINGS: Diffuse soft tissue swelling. Mild to moderate osteoarthritis about the ankle. No acute fracture or dislocation. Tiny calcaneal spur. Base of fifth metatarsal and talar dome intact. IMPRESSION: Soft tissue swelling and degenerative change, without acute superimposed osseous finding. Electronically Signed   By: Abigail Miyamoto M.D.   On: 02/10/2023 16:54   DG Knee Complete 4 Views Left  Result Date: 02/10/2023 CLINICAL DATA:  Pain after fall EXAM: LEFT KNEE - COMPLETE 4 VIEW COMPARISON:  None Available. FINDINGS: Osteopenia there is comminuted mildly displaced fracture of the mid waist of the patella. Associated large joint effusion. Hyperostosis with osteophytes seen of all 3 compartments which are small. There is also joint space loss of the medial compartment. IMPRESSION: Slightly displaced midbody transverse patellar fracture. Large knee joint effusion. Osteopenia with mild degenerative changes. Electronically Signed   By: Jill Side M.D.   On: 02/10/2023 16:49   DG Wrist Complete Right  Result Date: 02/10/2023 CLINICAL DATA:  Fall.  Pain. EXAM: RIGHT WRIST - COMPLETE 3+ VIEW COMPARISON:  Right hand radiographs 09/29/2016 FINDINGS: There is diffuse decreased bone mineralization. There is again severe thumb carpometacarpal joint space narrowing with subchondral sclerosis and peripheral osteophytosis. Mild medial greater than lateral triscaphe joint space narrowing and subchondral cystic changes. Mild-to-moderate joint space narrowing of the interphalangeal joints diffusely. No acute fracture is seen. No dislocation. IMPRESSION: 1. No acute fracture. 2. Severe thumb carpometacarpal osteoarthritis. Electronically Signed   By: Yvonne Kendall M.D.   On: 02/10/2023 16:48   US Abdomen  Limited RUQ (LIVER/GB)  Result Date: 01/26/2023 CLINICAL DATA:  Cirrhosis. EXAM: ULTRASOUND ABDOMEN LIMITED RIGHT UPPER QUADRANT COMPARISON:  Abdominal ultrasound July 07, 2022 FINDINGS: Gallbladder: Surgically absent Common bile duct: Diameter: Measures 3.6 mm centrally in 6.4 mm distally. Liver: Increased heterogeneous echogenicity. No liver mass identified. Portal vein is patent on color Doppler imaging with normal direction of blood flow towards the liver. Other: None. IMPRESSION: 1. Diffuse increased echogenicity is consistent with hepatic steatosis identified on a CT of the abdomen and pelvis dated May 31, 2022. The patient also has a history of cirrhosis which may result in increased echogenicity. No liver mass identified. 2. The distal common bile duct is  borderline measuring 6.4 mm. Recommend correlation with LFTs. Electronically Signed   By: Dorise Bullion III M.D.   On: 01/26/2023 10:42     The results of significant diagnostics from this hospitalization (including imaging, microbiology, ancillary and laboratory) are listed below for reference.     Microbiology: No results found for this or any previous visit (from the past 240 hour(s)).   Labs: BNP (last 3 results) No results for input(s): "BNP" in the last 8760 hours. Basic Metabolic Panel: Recent Labs  Lab 02/10/23 2119 02/11/23 0523 02/12/23 0615 02/13/23 0430  NA 138 136 135 136  K 3.6 3.4* 4.0 4.0  CL 109 106 104 100  CO2 20* 23 24 27   GLUCOSE 97 85 150* 105*  BUN 18 18 16 20   CREATININE 1.02* 1.06* 1.23* 1.13*  CALCIUM 8.3* 8.1* 8.1* 8.6*  MG  --  1.8 1.8 1.8  PHOS  --  3.9  --   --    Liver Function Tests: Recent Labs  Lab 02/11/23 0523  AST 49*  ALT 28  ALKPHOS 99  BILITOT 2.3*  PROT 5.5*  ALBUMIN 3.2*   No results for input(s): "LIPASE", "AMYLASE" in the last 168 hours. No results for input(s): "AMMONIA" in the last 168 hours. CBC: Recent Labs  Lab 02/10/23 2154 02/11/23 0523 02/12/23 0615  02/13/23 0430  WBC 5.4 4.7 4.0 5.0  NEUTROABS 3.2  --   --   --   HGB 10.5* 10.2* 10.2* 11.8*  HCT 30.8* 30.4* 29.5* 34.5*  MCV 100.3* 100.7* 100.3* 99.7  PLT 106* 106* 105* 118*   Cardiac Enzymes: No results for input(s): "CKTOTAL", "CKMB", "CKMBINDEX", "TROPONINI" in the last 168 hours. BNP: Invalid input(s): "POCBNP" CBG: Recent Labs  Lab 02/10/23 2016 02/11/23 1534  GLUCAP 72 80   D-Dimer No results for input(s): "DDIMER" in the last 72 hours. Hgb A1c No results for input(s): "HGBA1C" in the last 72 hours. Lipid Profile No results for input(s): "CHOL", "HDL", "LDLCALC", "TRIG", "CHOLHDL", "LDLDIRECT" in the last 72 hours. Thyroid function studies No results for input(s): "TSH", "T4TOTAL", "T3FREE", "THYROIDAB" in the last 72 hours.  Invalid input(s): "FREET3" Anemia work up No results for input(s): "VITAMINB12", "FOLATE", "FERRITIN", "TIBC", "IRON", "RETICCTPCT" in the last 72 hours. Urinalysis    Component Value Date/Time   COLORURINE YELLOW 08/04/2016 2126   APPEARANCEUR CLEAR 08/04/2016 2126   LABSPEC 1.010 08/04/2016 2126   PHURINE 6.0 08/04/2016 2126   GLUCOSEU NEGATIVE 08/04/2016 2126   HGBUR NEGATIVE 08/04/2016 2126   BILIRUBINUR NEGATIVE 08/04/2016 2126   KETONESUR NEGATIVE 08/04/2016 2126   PROTEINUR NEGATIVE 08/04/2016 2126   UROBILINOGEN 1.0 08/28/2014 2323   NITRITE NEGATIVE 08/04/2016 2126   LEUKOCYTESUR TRACE (A) 08/04/2016 2126   Sepsis Labs Recent Labs  Lab 02/10/23 2154 02/11/23 0523 02/12/23 0615 02/13/23 0430  WBC 5.4 4.7 4.0 5.0   Microbiology No results found for this or any previous visit (from the past 240 hour(s)).   Time coordinating discharge:  I have spent 35 minutes face to face with the patient and on the ward discussing the patients care, assessment, plan and disposition with other care givers. >50% of the time was devoted counseling the patient about the risks and benefits of treatment/Discharge disposition and  coordinating care.   SIGNED:   Damita Lack, MD  [facility] 02/15/2023, 9:50 AM   If 7PM-7AM, please contact night-coverage

## 2023-02-16 DIAGNOSIS — J069 Acute upper respiratory infection, unspecified: Secondary | ICD-10-CM | POA: Diagnosis not present

## 2023-02-16 DIAGNOSIS — I509 Heart failure, unspecified: Secondary | ICD-10-CM | POA: Diagnosis not present

## 2023-02-16 DIAGNOSIS — I10 Essential (primary) hypertension: Secondary | ICD-10-CM | POA: Diagnosis not present

## 2023-02-16 DIAGNOSIS — S022XXD Fracture of nasal bones, subsequent encounter for fracture with routine healing: Secondary | ICD-10-CM | POA: Diagnosis not present

## 2023-02-16 DIAGNOSIS — S82032D Displaced transverse fracture of left patella, subsequent encounter for closed fracture with routine healing: Secondary | ICD-10-CM | POA: Diagnosis not present

## 2023-02-16 DIAGNOSIS — D649 Anemia, unspecified: Secondary | ICD-10-CM | POA: Diagnosis not present

## 2023-02-16 DIAGNOSIS — E039 Hypothyroidism, unspecified: Secondary | ICD-10-CM | POA: Diagnosis not present

## 2023-02-16 DIAGNOSIS — S42214D Unspecified nondisplaced fracture of surgical neck of right humerus, subsequent encounter for fracture with routine healing: Secondary | ICD-10-CM | POA: Diagnosis not present

## 2023-02-16 DIAGNOSIS — K219 Gastro-esophageal reflux disease without esophagitis: Secondary | ICD-10-CM | POA: Diagnosis not present

## 2023-02-16 DIAGNOSIS — E785 Hyperlipidemia, unspecified: Secondary | ICD-10-CM | POA: Diagnosis not present

## 2023-02-16 DIAGNOSIS — R300 Dysuria: Secondary | ICD-10-CM | POA: Diagnosis not present

## 2023-02-18 ENCOUNTER — Other Ambulatory Visit (INDEPENDENT_AMBULATORY_CARE_PROVIDER_SITE_OTHER): Payer: Self-pay | Admitting: Gastroenterology

## 2023-02-21 ENCOUNTER — Telehealth: Payer: Self-pay

## 2023-02-21 DIAGNOSIS — E1165 Type 2 diabetes mellitus with hyperglycemia: Secondary | ICD-10-CM | POA: Diagnosis not present

## 2023-02-21 NOTE — Telephone Encounter (Signed)
Received call from Ms. Patel(therapist) wanting to know about a knee stabilizer for her R Knee. Spoke with Dr. Aline Brochure and he did a consult for shoulder, L Knee and wrist, but not right knee. I explained all of this to therapist and she stated she understood. Patient has an appointment on Thursday with Dr. Aline Brochure.

## 2023-02-21 NOTE — Telephone Encounter (Signed)
Therapist at [facility] left message asking if patient needs a knee mobilizer.

## 2023-02-21 NOTE — Telephone Encounter (Signed)
No she is in a playmaker from Dr Aline Brochure she needs that, he locked it at level he wants her to flex to. I called to make sure she has that, she states she does, but question was does she need an immobilizer for her total knee on the right, I advised she does not.  To you FYI

## 2023-02-22 DIAGNOSIS — J449 Chronic obstructive pulmonary disease, unspecified: Secondary | ICD-10-CM | POA: Diagnosis not present

## 2023-02-23 DIAGNOSIS — K5900 Constipation, unspecified: Secondary | ICD-10-CM | POA: Diagnosis not present

## 2023-02-23 DIAGNOSIS — J449 Chronic obstructive pulmonary disease, unspecified: Secondary | ICD-10-CM | POA: Diagnosis not present

## 2023-02-23 DIAGNOSIS — K219 Gastro-esophageal reflux disease without esophagitis: Secondary | ICD-10-CM | POA: Diagnosis not present

## 2023-02-23 DIAGNOSIS — E559 Vitamin D deficiency, unspecified: Secondary | ICD-10-CM | POA: Diagnosis not present

## 2023-02-23 DIAGNOSIS — D649 Anemia, unspecified: Secondary | ICD-10-CM | POA: Diagnosis not present

## 2023-02-23 DIAGNOSIS — E039 Hypothyroidism, unspecified: Secondary | ICD-10-CM | POA: Diagnosis not present

## 2023-02-23 DIAGNOSIS — S42214D Unspecified nondisplaced fracture of surgical neck of right humerus, subsequent encounter for fracture with routine healing: Secondary | ICD-10-CM | POA: Diagnosis not present

## 2023-02-23 DIAGNOSIS — E785 Hyperlipidemia, unspecified: Secondary | ICD-10-CM | POA: Diagnosis not present

## 2023-02-23 DIAGNOSIS — J45909 Unspecified asthma, uncomplicated: Secondary | ICD-10-CM | POA: Diagnosis not present

## 2023-02-23 DIAGNOSIS — I10 Essential (primary) hypertension: Secondary | ICD-10-CM | POA: Diagnosis not present

## 2023-02-23 DIAGNOSIS — I509 Heart failure, unspecified: Secondary | ICD-10-CM | POA: Diagnosis not present

## 2023-02-23 DIAGNOSIS — H409 Unspecified glaucoma: Secondary | ICD-10-CM | POA: Diagnosis not present

## 2023-02-24 ENCOUNTER — Other Ambulatory Visit (INDEPENDENT_AMBULATORY_CARE_PROVIDER_SITE_OTHER): Payer: 59

## 2023-02-24 ENCOUNTER — Ambulatory Visit (HOSPITAL_COMMUNITY): Payer: 59

## 2023-02-24 ENCOUNTER — Ambulatory Visit (INDEPENDENT_AMBULATORY_CARE_PROVIDER_SITE_OTHER): Payer: 59 | Admitting: Orthopedic Surgery

## 2023-02-24 DIAGNOSIS — U071 COVID-19: Secondary | ICD-10-CM | POA: Diagnosis not present

## 2023-02-24 DIAGNOSIS — S82035D Nondisplaced transverse fracture of left patella, subsequent encounter for closed fracture with routine healing: Secondary | ICD-10-CM

## 2023-02-24 DIAGNOSIS — S42291D Other displaced fracture of upper end of right humerus, subsequent encounter for fracture with routine healing: Secondary | ICD-10-CM

## 2023-02-24 NOTE — Progress Notes (Signed)
Consult follow-up  I saw this patient in the hospital on February 10, 2023  Encounter Diagnoses  Name Primary?   Closed nondisplaced transverse fracture of left patella with routine healing, subsequent encounter Yes   Closed fracture of head of right humerus with routine healing, subsequent encounter     Presents back for follow-up, she had fallen and injured her left patella with a fracture transverse nondisplaced treated with a brace  Fracture proximal humerus treated with sling and swath  Here for x-rays today  X-ray humerus looks good  X-ray patella looks good  Patient can go home with home health home PT and assistance with her husband  Follow-up x-rays in 4 weeks  Note: Passive range of motion right shoulder no weightbearing  Straight leg raises in the brace quad sets in the brace do not bend the knee knee locked at 0 degrees for up to 6 weeks

## 2023-02-25 ENCOUNTER — Telehealth (INDEPENDENT_AMBULATORY_CARE_PROVIDER_SITE_OTHER): Payer: Self-pay | Admitting: Gastroenterology

## 2023-02-25 NOTE — Telephone Encounter (Signed)
Pt was seen in office 01/17/23. MRI was ordered (scheduled for 02/24/23- I sent pt a my chart message with appt info). Pt sent my chart message needing to cancel MRI appt-when I look it does not have the date beside it like it usually does. I called Central Scheduling and Collette states the provider cancelled it. Please advise. Thank you!!

## 2023-02-28 ENCOUNTER — Ambulatory Visit: Payer: 59 | Admitting: Orthopedic Surgery

## 2023-02-28 DIAGNOSIS — S82032D Displaced transverse fracture of left patella, subsequent encounter for closed fracture with routine healing: Secondary | ICD-10-CM | POA: Diagnosis not present

## 2023-02-28 DIAGNOSIS — S42214D Unspecified nondisplaced fracture of surgical neck of right humerus, subsequent encounter for fracture with routine healing: Secondary | ICD-10-CM | POA: Diagnosis not present

## 2023-02-28 DIAGNOSIS — E039 Hypothyroidism, unspecified: Secondary | ICD-10-CM | POA: Diagnosis not present

## 2023-02-28 DIAGNOSIS — H409 Unspecified glaucoma: Secondary | ICD-10-CM | POA: Diagnosis not present

## 2023-02-28 DIAGNOSIS — K5900 Constipation, unspecified: Secondary | ICD-10-CM | POA: Diagnosis not present

## 2023-02-28 DIAGNOSIS — U071 COVID-19: Secondary | ICD-10-CM | POA: Diagnosis not present

## 2023-02-28 DIAGNOSIS — J449 Chronic obstructive pulmonary disease, unspecified: Secondary | ICD-10-CM | POA: Diagnosis not present

## 2023-02-28 DIAGNOSIS — E785 Hyperlipidemia, unspecified: Secondary | ICD-10-CM | POA: Diagnosis not present

## 2023-02-28 DIAGNOSIS — R5381 Other malaise: Secondary | ICD-10-CM | POA: Diagnosis not present

## 2023-02-28 DIAGNOSIS — E559 Vitamin D deficiency, unspecified: Secondary | ICD-10-CM | POA: Diagnosis not present

## 2023-02-28 DIAGNOSIS — J45909 Unspecified asthma, uncomplicated: Secondary | ICD-10-CM | POA: Diagnosis not present

## 2023-03-03 DIAGNOSIS — E1140 Type 2 diabetes mellitus with diabetic neuropathy, unspecified: Secondary | ICD-10-CM | POA: Diagnosis not present

## 2023-03-03 DIAGNOSIS — S42214D Unspecified nondisplaced fracture of surgical neck of right humerus, subsequent encounter for fracture with routine healing: Secondary | ICD-10-CM | POA: Diagnosis not present

## 2023-03-03 DIAGNOSIS — S82032D Displaced transverse fracture of left patella, subsequent encounter for closed fracture with routine healing: Secondary | ICD-10-CM | POA: Diagnosis not present

## 2023-03-03 DIAGNOSIS — I509 Heart failure, unspecified: Secondary | ICD-10-CM | POA: Diagnosis not present

## 2023-03-03 DIAGNOSIS — I272 Pulmonary hypertension, unspecified: Secondary | ICD-10-CM | POA: Diagnosis not present

## 2023-03-03 DIAGNOSIS — I110 Hypertensive heart disease with heart failure: Secondary | ICD-10-CM | POA: Diagnosis not present

## 2023-03-07 DIAGNOSIS — S42214D Unspecified nondisplaced fracture of surgical neck of right humerus, subsequent encounter for fracture with routine healing: Secondary | ICD-10-CM | POA: Diagnosis not present

## 2023-03-07 DIAGNOSIS — I509 Heart failure, unspecified: Secondary | ICD-10-CM | POA: Diagnosis not present

## 2023-03-07 DIAGNOSIS — S82032D Displaced transverse fracture of left patella, subsequent encounter for closed fracture with routine healing: Secondary | ICD-10-CM | POA: Diagnosis not present

## 2023-03-07 DIAGNOSIS — I272 Pulmonary hypertension, unspecified: Secondary | ICD-10-CM | POA: Diagnosis not present

## 2023-03-07 DIAGNOSIS — E1140 Type 2 diabetes mellitus with diabetic neuropathy, unspecified: Secondary | ICD-10-CM | POA: Diagnosis not present

## 2023-03-07 DIAGNOSIS — I110 Hypertensive heart disease with heart failure: Secondary | ICD-10-CM | POA: Diagnosis not present

## 2023-03-09 DIAGNOSIS — E1140 Type 2 diabetes mellitus with diabetic neuropathy, unspecified: Secondary | ICD-10-CM | POA: Diagnosis not present

## 2023-03-09 DIAGNOSIS — S82032D Displaced transverse fracture of left patella, subsequent encounter for closed fracture with routine healing: Secondary | ICD-10-CM | POA: Diagnosis not present

## 2023-03-09 DIAGNOSIS — I272 Pulmonary hypertension, unspecified: Secondary | ICD-10-CM | POA: Diagnosis not present

## 2023-03-09 DIAGNOSIS — I509 Heart failure, unspecified: Secondary | ICD-10-CM | POA: Diagnosis not present

## 2023-03-09 DIAGNOSIS — S42214D Unspecified nondisplaced fracture of surgical neck of right humerus, subsequent encounter for fracture with routine healing: Secondary | ICD-10-CM | POA: Diagnosis not present

## 2023-03-09 DIAGNOSIS — I110 Hypertensive heart disease with heart failure: Secondary | ICD-10-CM | POA: Diagnosis not present

## 2023-03-14 ENCOUNTER — Ambulatory Visit: Payer: 59 | Admitting: Gastroenterology

## 2023-03-14 DIAGNOSIS — I509 Heart failure, unspecified: Secondary | ICD-10-CM | POA: Diagnosis not present

## 2023-03-14 DIAGNOSIS — S42214D Unspecified nondisplaced fracture of surgical neck of right humerus, subsequent encounter for fracture with routine healing: Secondary | ICD-10-CM | POA: Diagnosis not present

## 2023-03-14 DIAGNOSIS — I272 Pulmonary hypertension, unspecified: Secondary | ICD-10-CM | POA: Diagnosis not present

## 2023-03-14 DIAGNOSIS — I110 Hypertensive heart disease with heart failure: Secondary | ICD-10-CM | POA: Diagnosis not present

## 2023-03-14 DIAGNOSIS — S82032D Displaced transverse fracture of left patella, subsequent encounter for closed fracture with routine healing: Secondary | ICD-10-CM | POA: Diagnosis not present

## 2023-03-14 DIAGNOSIS — E1140 Type 2 diabetes mellitus with diabetic neuropathy, unspecified: Secondary | ICD-10-CM | POA: Diagnosis not present

## 2023-03-15 DIAGNOSIS — E261 Secondary hyperaldosteronism: Secondary | ICD-10-CM | POA: Diagnosis not present

## 2023-03-15 DIAGNOSIS — I700 Atherosclerosis of aorta: Secondary | ICD-10-CM | POA: Diagnosis not present

## 2023-03-15 DIAGNOSIS — K766 Portal hypertension: Secondary | ICD-10-CM | POA: Diagnosis not present

## 2023-03-15 DIAGNOSIS — Z09 Encounter for follow-up examination after completed treatment for conditions other than malignant neoplasm: Secondary | ICD-10-CM | POA: Diagnosis not present

## 2023-03-15 DIAGNOSIS — J449 Chronic obstructive pulmonary disease, unspecified: Secondary | ICD-10-CM | POA: Diagnosis not present

## 2023-03-16 DIAGNOSIS — S82092S Other fracture of left patella, sequela: Secondary | ICD-10-CM | POA: Diagnosis not present

## 2023-03-16 DIAGNOSIS — S82032D Displaced transverse fracture of left patella, subsequent encounter for closed fracture with routine healing: Secondary | ICD-10-CM | POA: Diagnosis not present

## 2023-03-16 DIAGNOSIS — I272 Pulmonary hypertension, unspecified: Secondary | ICD-10-CM | POA: Diagnosis not present

## 2023-03-16 DIAGNOSIS — S42214D Unspecified nondisplaced fracture of surgical neck of right humerus, subsequent encounter for fracture with routine healing: Secondary | ICD-10-CM | POA: Diagnosis not present

## 2023-03-16 DIAGNOSIS — I110 Hypertensive heart disease with heart failure: Secondary | ICD-10-CM | POA: Diagnosis not present

## 2023-03-16 DIAGNOSIS — E1140 Type 2 diabetes mellitus with diabetic neuropathy, unspecified: Secondary | ICD-10-CM | POA: Diagnosis not present

## 2023-03-16 DIAGNOSIS — I509 Heart failure, unspecified: Secondary | ICD-10-CM | POA: Diagnosis not present

## 2023-03-16 DIAGNOSIS — J449 Chronic obstructive pulmonary disease, unspecified: Secondary | ICD-10-CM | POA: Diagnosis not present

## 2023-03-17 DIAGNOSIS — I110 Hypertensive heart disease with heart failure: Secondary | ICD-10-CM | POA: Diagnosis not present

## 2023-03-17 DIAGNOSIS — S42214D Unspecified nondisplaced fracture of surgical neck of right humerus, subsequent encounter for fracture with routine healing: Secondary | ICD-10-CM | POA: Diagnosis not present

## 2023-03-17 DIAGNOSIS — E1140 Type 2 diabetes mellitus with diabetic neuropathy, unspecified: Secondary | ICD-10-CM | POA: Diagnosis not present

## 2023-03-17 DIAGNOSIS — S82032D Displaced transverse fracture of left patella, subsequent encounter for closed fracture with routine healing: Secondary | ICD-10-CM | POA: Diagnosis not present

## 2023-03-23 DIAGNOSIS — S42291A Other displaced fracture of upper end of right humerus, initial encounter for closed fracture: Secondary | ICD-10-CM | POA: Insufficient documentation

## 2023-03-24 ENCOUNTER — Ambulatory Visit (INDEPENDENT_AMBULATORY_CARE_PROVIDER_SITE_OTHER): Payer: 59 | Admitting: Orthopedic Surgery

## 2023-03-24 ENCOUNTER — Encounter: Payer: Self-pay | Admitting: Orthopedic Surgery

## 2023-03-24 ENCOUNTER — Other Ambulatory Visit (INDEPENDENT_AMBULATORY_CARE_PROVIDER_SITE_OTHER): Payer: 59

## 2023-03-24 DIAGNOSIS — S42291D Other displaced fracture of upper end of right humerus, subsequent encounter for fracture with routine healing: Secondary | ICD-10-CM

## 2023-03-24 DIAGNOSIS — S82035D Nondisplaced transverse fracture of left patella, subsequent encounter for closed fracture with routine healing: Secondary | ICD-10-CM

## 2023-03-24 DIAGNOSIS — J449 Chronic obstructive pulmonary disease, unspecified: Secondary | ICD-10-CM | POA: Diagnosis not present

## 2023-03-24 NOTE — Progress Notes (Signed)
   This is a follow-up appointment  Encounter Diagnoses  Name Primary?   Closed nondisplaced transverse fracture of left patella with routine healing, subsequent encounter 02/10/23 Yes   Closed fracture of head of right humerus with routine healing, subsequent encounter 02/10/23      Chief Complaint  Patient presents with   Knee Injury    Left patella fracture and right shoulder fracture 02/10/23/ has a rash from sling and knee brace     X-rays of the right shoulder and left patella at 6 weeks show that both fractures are stable minimally displaced  The proximal humerus fracture has healed  The left patella fracture has good position healing well has not displaced and is stable  I advanced the brace 0-90 weight-bear as tolerated quadriceps exercises  Shoulder instructions are advanced active active assisted and passive range of motion and PRE's as tolerated  Repeat x-rays knee only in 4 weeks

## 2023-03-24 NOTE — Patient Instructions (Signed)
Physical Therapy  Left knee advance ROM as tolerated   Brace for 6 more weeks  Weight bearing as tolerated   Ok to move the knee and bend the knee   Right shoulder no restrictions other than pain; AAROM AROM PROM PREs

## 2023-03-25 DIAGNOSIS — I110 Hypertensive heart disease with heart failure: Secondary | ICD-10-CM | POA: Diagnosis not present

## 2023-03-25 DIAGNOSIS — I509 Heart failure, unspecified: Secondary | ICD-10-CM | POA: Diagnosis not present

## 2023-03-25 DIAGNOSIS — I272 Pulmonary hypertension, unspecified: Secondary | ICD-10-CM | POA: Diagnosis not present

## 2023-03-25 DIAGNOSIS — S42214D Unspecified nondisplaced fracture of surgical neck of right humerus, subsequent encounter for fracture with routine healing: Secondary | ICD-10-CM | POA: Diagnosis not present

## 2023-03-25 DIAGNOSIS — S82032D Displaced transverse fracture of left patella, subsequent encounter for closed fracture with routine healing: Secondary | ICD-10-CM | POA: Diagnosis not present

## 2023-03-25 DIAGNOSIS — E1140 Type 2 diabetes mellitus with diabetic neuropathy, unspecified: Secondary | ICD-10-CM | POA: Diagnosis not present

## 2023-03-28 DIAGNOSIS — E1140 Type 2 diabetes mellitus with diabetic neuropathy, unspecified: Secondary | ICD-10-CM | POA: Diagnosis not present

## 2023-03-28 DIAGNOSIS — I272 Pulmonary hypertension, unspecified: Secondary | ICD-10-CM | POA: Diagnosis not present

## 2023-03-28 DIAGNOSIS — I110 Hypertensive heart disease with heart failure: Secondary | ICD-10-CM | POA: Diagnosis not present

## 2023-03-28 DIAGNOSIS — S82032D Displaced transverse fracture of left patella, subsequent encounter for closed fracture with routine healing: Secondary | ICD-10-CM | POA: Diagnosis not present

## 2023-03-28 DIAGNOSIS — I509 Heart failure, unspecified: Secondary | ICD-10-CM | POA: Diagnosis not present

## 2023-03-28 DIAGNOSIS — S42214D Unspecified nondisplaced fracture of surgical neck of right humerus, subsequent encounter for fracture with routine healing: Secondary | ICD-10-CM | POA: Diagnosis not present

## 2023-03-29 DIAGNOSIS — I509 Heart failure, unspecified: Secondary | ICD-10-CM | POA: Diagnosis not present

## 2023-03-29 DIAGNOSIS — I110 Hypertensive heart disease with heart failure: Secondary | ICD-10-CM | POA: Diagnosis not present

## 2023-03-29 DIAGNOSIS — S42214D Unspecified nondisplaced fracture of surgical neck of right humerus, subsequent encounter for fracture with routine healing: Secondary | ICD-10-CM | POA: Diagnosis not present

## 2023-03-29 DIAGNOSIS — S82032D Displaced transverse fracture of left patella, subsequent encounter for closed fracture with routine healing: Secondary | ICD-10-CM | POA: Diagnosis not present

## 2023-03-29 DIAGNOSIS — I272 Pulmonary hypertension, unspecified: Secondary | ICD-10-CM | POA: Diagnosis not present

## 2023-03-29 DIAGNOSIS — E1140 Type 2 diabetes mellitus with diabetic neuropathy, unspecified: Secondary | ICD-10-CM | POA: Diagnosis not present

## 2023-03-30 ENCOUNTER — Ambulatory Visit: Payer: 59 | Admitting: Neurology

## 2023-04-01 DIAGNOSIS — I110 Hypertensive heart disease with heart failure: Secondary | ICD-10-CM | POA: Diagnosis not present

## 2023-04-01 DIAGNOSIS — E1140 Type 2 diabetes mellitus with diabetic neuropathy, unspecified: Secondary | ICD-10-CM | POA: Diagnosis not present

## 2023-04-01 DIAGNOSIS — S82032D Displaced transverse fracture of left patella, subsequent encounter for closed fracture with routine healing: Secondary | ICD-10-CM | POA: Diagnosis not present

## 2023-04-01 DIAGNOSIS — I509 Heart failure, unspecified: Secondary | ICD-10-CM | POA: Diagnosis not present

## 2023-04-01 DIAGNOSIS — S42214D Unspecified nondisplaced fracture of surgical neck of right humerus, subsequent encounter for fracture with routine healing: Secondary | ICD-10-CM | POA: Diagnosis not present

## 2023-04-01 DIAGNOSIS — I272 Pulmonary hypertension, unspecified: Secondary | ICD-10-CM | POA: Diagnosis not present

## 2023-04-04 DIAGNOSIS — S82032D Displaced transverse fracture of left patella, subsequent encounter for closed fracture with routine healing: Secondary | ICD-10-CM | POA: Diagnosis not present

## 2023-04-04 DIAGNOSIS — I110 Hypertensive heart disease with heart failure: Secondary | ICD-10-CM | POA: Diagnosis not present

## 2023-04-04 DIAGNOSIS — S42214D Unspecified nondisplaced fracture of surgical neck of right humerus, subsequent encounter for fracture with routine healing: Secondary | ICD-10-CM | POA: Diagnosis not present

## 2023-04-04 DIAGNOSIS — I272 Pulmonary hypertension, unspecified: Secondary | ICD-10-CM | POA: Diagnosis not present

## 2023-04-04 DIAGNOSIS — E1140 Type 2 diabetes mellitus with diabetic neuropathy, unspecified: Secondary | ICD-10-CM | POA: Diagnosis not present

## 2023-04-04 DIAGNOSIS — I509 Heart failure, unspecified: Secondary | ICD-10-CM | POA: Diagnosis not present

## 2023-04-05 DIAGNOSIS — I509 Heart failure, unspecified: Secondary | ICD-10-CM | POA: Diagnosis not present

## 2023-04-05 DIAGNOSIS — S42214D Unspecified nondisplaced fracture of surgical neck of right humerus, subsequent encounter for fracture with routine healing: Secondary | ICD-10-CM | POA: Diagnosis not present

## 2023-04-05 DIAGNOSIS — S82032D Displaced transverse fracture of left patella, subsequent encounter for closed fracture with routine healing: Secondary | ICD-10-CM | POA: Diagnosis not present

## 2023-04-05 DIAGNOSIS — I110 Hypertensive heart disease with heart failure: Secondary | ICD-10-CM | POA: Diagnosis not present

## 2023-04-05 DIAGNOSIS — E1140 Type 2 diabetes mellitus with diabetic neuropathy, unspecified: Secondary | ICD-10-CM | POA: Diagnosis not present

## 2023-04-05 DIAGNOSIS — I272 Pulmonary hypertension, unspecified: Secondary | ICD-10-CM | POA: Diagnosis not present

## 2023-04-06 DIAGNOSIS — S82032D Displaced transverse fracture of left patella, subsequent encounter for closed fracture with routine healing: Secondary | ICD-10-CM | POA: Diagnosis not present

## 2023-04-06 DIAGNOSIS — I272 Pulmonary hypertension, unspecified: Secondary | ICD-10-CM | POA: Diagnosis not present

## 2023-04-06 DIAGNOSIS — S42214D Unspecified nondisplaced fracture of surgical neck of right humerus, subsequent encounter for fracture with routine healing: Secondary | ICD-10-CM | POA: Diagnosis not present

## 2023-04-06 DIAGNOSIS — I509 Heart failure, unspecified: Secondary | ICD-10-CM | POA: Diagnosis not present

## 2023-04-06 DIAGNOSIS — I110 Hypertensive heart disease with heart failure: Secondary | ICD-10-CM | POA: Diagnosis not present

## 2023-04-06 DIAGNOSIS — E1140 Type 2 diabetes mellitus with diabetic neuropathy, unspecified: Secondary | ICD-10-CM | POA: Diagnosis not present

## 2023-04-13 DIAGNOSIS — E1140 Type 2 diabetes mellitus with diabetic neuropathy, unspecified: Secondary | ICD-10-CM | POA: Diagnosis not present

## 2023-04-13 DIAGNOSIS — I272 Pulmonary hypertension, unspecified: Secondary | ICD-10-CM | POA: Diagnosis not present

## 2023-04-13 DIAGNOSIS — I509 Heart failure, unspecified: Secondary | ICD-10-CM | POA: Diagnosis not present

## 2023-04-13 DIAGNOSIS — S42214D Unspecified nondisplaced fracture of surgical neck of right humerus, subsequent encounter for fracture with routine healing: Secondary | ICD-10-CM | POA: Diagnosis not present

## 2023-04-13 DIAGNOSIS — S82032D Displaced transverse fracture of left patella, subsequent encounter for closed fracture with routine healing: Secondary | ICD-10-CM | POA: Diagnosis not present

## 2023-04-13 DIAGNOSIS — I110 Hypertensive heart disease with heart failure: Secondary | ICD-10-CM | POA: Diagnosis not present

## 2023-04-15 DIAGNOSIS — I110 Hypertensive heart disease with heart failure: Secondary | ICD-10-CM | POA: Diagnosis not present

## 2023-04-15 DIAGNOSIS — S42214D Unspecified nondisplaced fracture of surgical neck of right humerus, subsequent encounter for fracture with routine healing: Secondary | ICD-10-CM | POA: Diagnosis not present

## 2023-04-15 DIAGNOSIS — I272 Pulmonary hypertension, unspecified: Secondary | ICD-10-CM | POA: Diagnosis not present

## 2023-04-15 DIAGNOSIS — S82032D Displaced transverse fracture of left patella, subsequent encounter for closed fracture with routine healing: Secondary | ICD-10-CM | POA: Diagnosis not present

## 2023-04-15 DIAGNOSIS — E1140 Type 2 diabetes mellitus with diabetic neuropathy, unspecified: Secondary | ICD-10-CM | POA: Diagnosis not present

## 2023-04-15 DIAGNOSIS — I509 Heart failure, unspecified: Secondary | ICD-10-CM | POA: Diagnosis not present

## 2023-04-20 DIAGNOSIS — S42214D Unspecified nondisplaced fracture of surgical neck of right humerus, subsequent encounter for fracture with routine healing: Secondary | ICD-10-CM | POA: Diagnosis not present

## 2023-04-20 DIAGNOSIS — I110 Hypertensive heart disease with heart failure: Secondary | ICD-10-CM | POA: Diagnosis not present

## 2023-04-20 DIAGNOSIS — S82032D Displaced transverse fracture of left patella, subsequent encounter for closed fracture with routine healing: Secondary | ICD-10-CM | POA: Diagnosis not present

## 2023-04-20 DIAGNOSIS — I509 Heart failure, unspecified: Secondary | ICD-10-CM | POA: Diagnosis not present

## 2023-04-20 DIAGNOSIS — I272 Pulmonary hypertension, unspecified: Secondary | ICD-10-CM | POA: Diagnosis not present

## 2023-04-20 DIAGNOSIS — E1140 Type 2 diabetes mellitus with diabetic neuropathy, unspecified: Secondary | ICD-10-CM | POA: Diagnosis not present

## 2023-04-21 ENCOUNTER — Ambulatory Visit (INDEPENDENT_AMBULATORY_CARE_PROVIDER_SITE_OTHER): Payer: 59 | Admitting: Orthopedic Surgery

## 2023-04-21 ENCOUNTER — Encounter: Payer: Self-pay | Admitting: Orthopedic Surgery

## 2023-04-21 ENCOUNTER — Other Ambulatory Visit (INDEPENDENT_AMBULATORY_CARE_PROVIDER_SITE_OTHER): Payer: 59

## 2023-04-21 VITALS — BP 120/68 | HR 67

## 2023-04-21 DIAGNOSIS — S82035D Nondisplaced transverse fracture of left patella, subsequent encounter for closed fracture with routine healing: Secondary | ICD-10-CM

## 2023-04-21 DIAGNOSIS — S42291D Other displaced fracture of upper end of right humerus, subsequent encounter for fracture with routine healing: Secondary | ICD-10-CM

## 2023-04-21 NOTE — Progress Notes (Signed)
Chief Complaint  Patient presents with   Left knee    Right shoulder fracture    Encounter Diagnoses  Name Primary?   Closed nondisplaced transverse fracture of left patella with routine healing, subsequent encounter 02/10/23 Yes   Closed fracture of head of right humerus with routine healing, subsequent encounter 02/10/23     This is a follow-up visit status post closed nondisplaced fracture left patella and this patient also had a proximal humerus fracture right shoulder  She has improved with a hinged knee brace which has been opened up for full range of motion and she is using a cane.  She still has some stiffness and pain in the right shoulder which will require physical therapy  Now that her x-ray today shows the fracture is healed and her range of motion is 0 to 95 degrees we can advance her physical therapy to regain strength and mobility in the left knee  Follow-up in 6 weeks  Encounter Diagnoses  Name Primary?   Closed nondisplaced transverse fracture of left patella with routine healing, subsequent encounter 02/10/23 Yes   Closed fracture of head of right humerus with routine healing, subsequent encounter 02/10/23

## 2023-04-21 NOTE — Patient Instructions (Addendum)
Physical therapy has been ordered for you  at [facility]  They should call you to schedule, if you dont hear from them call to schedule

## 2023-04-22 ENCOUNTER — Encounter (INDEPENDENT_AMBULATORY_CARE_PROVIDER_SITE_OTHER): Payer: Self-pay | Admitting: *Deleted

## 2023-04-22 DIAGNOSIS — S42214D Unspecified nondisplaced fracture of surgical neck of right humerus, subsequent encounter for fracture with routine healing: Secondary | ICD-10-CM | POA: Diagnosis not present

## 2023-04-22 DIAGNOSIS — S82032D Displaced transverse fracture of left patella, subsequent encounter for closed fracture with routine healing: Secondary | ICD-10-CM | POA: Diagnosis not present

## 2023-04-22 DIAGNOSIS — E1140 Type 2 diabetes mellitus with diabetic neuropathy, unspecified: Secondary | ICD-10-CM | POA: Diagnosis not present

## 2023-04-22 DIAGNOSIS — I509 Heart failure, unspecified: Secondary | ICD-10-CM | POA: Diagnosis not present

## 2023-04-22 DIAGNOSIS — I110 Hypertensive heart disease with heart failure: Secondary | ICD-10-CM | POA: Diagnosis not present

## 2023-04-22 DIAGNOSIS — I272 Pulmonary hypertension, unspecified: Secondary | ICD-10-CM | POA: Diagnosis not present

## 2023-04-23 DIAGNOSIS — S82092S Other fracture of left patella, sequela: Secondary | ICD-10-CM | POA: Diagnosis not present

## 2023-04-23 DIAGNOSIS — J449 Chronic obstructive pulmonary disease, unspecified: Secondary | ICD-10-CM | POA: Diagnosis not present

## 2023-04-24 DIAGNOSIS — J449 Chronic obstructive pulmonary disease, unspecified: Secondary | ICD-10-CM | POA: Diagnosis not present

## 2023-04-25 DIAGNOSIS — M25511 Pain in right shoulder: Secondary | ICD-10-CM | POA: Diagnosis not present

## 2023-04-25 DIAGNOSIS — M25531 Pain in right wrist: Secondary | ICD-10-CM | POA: Diagnosis not present

## 2023-04-25 DIAGNOSIS — R531 Weakness: Secondary | ICD-10-CM | POA: Diagnosis not present

## 2023-04-25 DIAGNOSIS — M25611 Stiffness of right shoulder, not elsewhere classified: Secondary | ICD-10-CM | POA: Diagnosis not present

## 2023-04-27 DIAGNOSIS — Z0000 Encounter for general adult medical examination without abnormal findings: Secondary | ICD-10-CM | POA: Diagnosis not present

## 2023-04-27 DIAGNOSIS — Z7189 Other specified counseling: Secondary | ICD-10-CM | POA: Diagnosis not present

## 2023-04-27 DIAGNOSIS — I10 Essential (primary) hypertension: Secondary | ICD-10-CM | POA: Diagnosis not present

## 2023-04-27 DIAGNOSIS — I700 Atherosclerosis of aorta: Secondary | ICD-10-CM | POA: Diagnosis not present

## 2023-04-27 DIAGNOSIS — Z299 Encounter for prophylactic measures, unspecified: Secondary | ICD-10-CM | POA: Diagnosis not present

## 2023-04-27 DIAGNOSIS — J449 Chronic obstructive pulmonary disease, unspecified: Secondary | ICD-10-CM | POA: Diagnosis not present

## 2023-04-28 DIAGNOSIS — M25662 Stiffness of left knee, not elsewhere classified: Secondary | ICD-10-CM | POA: Diagnosis not present

## 2023-04-28 DIAGNOSIS — M6281 Muscle weakness (generalized): Secondary | ICD-10-CM | POA: Diagnosis not present

## 2023-04-28 DIAGNOSIS — M25562 Pain in left knee: Secondary | ICD-10-CM | POA: Diagnosis not present

## 2023-04-28 DIAGNOSIS — R2689 Other abnormalities of gait and mobility: Secondary | ICD-10-CM | POA: Diagnosis not present

## 2023-05-03 DIAGNOSIS — M6281 Muscle weakness (generalized): Secondary | ICD-10-CM | POA: Diagnosis not present

## 2023-05-03 DIAGNOSIS — M25662 Stiffness of left knee, not elsewhere classified: Secondary | ICD-10-CM | POA: Diagnosis not present

## 2023-05-03 DIAGNOSIS — R2689 Other abnormalities of gait and mobility: Secondary | ICD-10-CM | POA: Diagnosis not present

## 2023-05-03 DIAGNOSIS — M25562 Pain in left knee: Secondary | ICD-10-CM | POA: Diagnosis not present

## 2023-05-03 DIAGNOSIS — M25511 Pain in right shoulder: Secondary | ICD-10-CM | POA: Diagnosis not present

## 2023-05-03 DIAGNOSIS — M25531 Pain in right wrist: Secondary | ICD-10-CM | POA: Diagnosis not present

## 2023-05-03 DIAGNOSIS — M25611 Stiffness of right shoulder, not elsewhere classified: Secondary | ICD-10-CM | POA: Diagnosis not present

## 2023-05-03 DIAGNOSIS — R531 Weakness: Secondary | ICD-10-CM | POA: Diagnosis not present

## 2023-05-06 DIAGNOSIS — M25662 Stiffness of left knee, not elsewhere classified: Secondary | ICD-10-CM | POA: Diagnosis not present

## 2023-05-06 DIAGNOSIS — M6281 Muscle weakness (generalized): Secondary | ICD-10-CM | POA: Diagnosis not present

## 2023-05-06 DIAGNOSIS — M25562 Pain in left knee: Secondary | ICD-10-CM | POA: Diagnosis not present

## 2023-05-06 DIAGNOSIS — M25511 Pain in right shoulder: Secondary | ICD-10-CM | POA: Diagnosis not present

## 2023-05-06 DIAGNOSIS — R2689 Other abnormalities of gait and mobility: Secondary | ICD-10-CM | POA: Diagnosis not present

## 2023-05-06 DIAGNOSIS — R531 Weakness: Secondary | ICD-10-CM | POA: Diagnosis not present

## 2023-05-06 DIAGNOSIS — M25611 Stiffness of right shoulder, not elsewhere classified: Secondary | ICD-10-CM | POA: Diagnosis not present

## 2023-05-06 DIAGNOSIS — M25531 Pain in right wrist: Secondary | ICD-10-CM | POA: Diagnosis not present

## 2023-05-10 DIAGNOSIS — M6281 Muscle weakness (generalized): Secondary | ICD-10-CM | POA: Diagnosis not present

## 2023-05-10 DIAGNOSIS — M25662 Stiffness of left knee, not elsewhere classified: Secondary | ICD-10-CM | POA: Diagnosis not present

## 2023-05-10 DIAGNOSIS — M25562 Pain in left knee: Secondary | ICD-10-CM | POA: Diagnosis not present

## 2023-05-10 DIAGNOSIS — R2689 Other abnormalities of gait and mobility: Secondary | ICD-10-CM | POA: Diagnosis not present

## 2023-05-12 DIAGNOSIS — R5383 Other fatigue: Secondary | ICD-10-CM | POA: Diagnosis not present

## 2023-05-12 DIAGNOSIS — Z0000 Encounter for general adult medical examination without abnormal findings: Secondary | ICD-10-CM | POA: Diagnosis not present

## 2023-05-12 DIAGNOSIS — I10 Essential (primary) hypertension: Secondary | ICD-10-CM | POA: Diagnosis not present

## 2023-05-12 DIAGNOSIS — E559 Vitamin D deficiency, unspecified: Secondary | ICD-10-CM | POA: Diagnosis not present

## 2023-05-12 DIAGNOSIS — Z299 Encounter for prophylactic measures, unspecified: Secondary | ICD-10-CM | POA: Diagnosis not present

## 2023-05-12 DIAGNOSIS — Z79899 Other long term (current) drug therapy: Secondary | ICD-10-CM | POA: Diagnosis not present

## 2023-05-12 DIAGNOSIS — E7800 Pure hypercholesterolemia, unspecified: Secondary | ICD-10-CM | POA: Diagnosis not present

## 2023-05-16 DIAGNOSIS — R5383 Other fatigue: Secondary | ICD-10-CM | POA: Diagnosis not present

## 2023-05-17 DIAGNOSIS — M25511 Pain in right shoulder: Secondary | ICD-10-CM | POA: Diagnosis not present

## 2023-05-17 DIAGNOSIS — M25662 Stiffness of left knee, not elsewhere classified: Secondary | ICD-10-CM | POA: Diagnosis not present

## 2023-05-17 DIAGNOSIS — M25531 Pain in right wrist: Secondary | ICD-10-CM | POA: Diagnosis not present

## 2023-05-17 DIAGNOSIS — R531 Weakness: Secondary | ICD-10-CM | POA: Diagnosis not present

## 2023-05-17 DIAGNOSIS — R2689 Other abnormalities of gait and mobility: Secondary | ICD-10-CM | POA: Diagnosis not present

## 2023-05-17 DIAGNOSIS — M6281 Muscle weakness (generalized): Secondary | ICD-10-CM | POA: Diagnosis not present

## 2023-05-17 DIAGNOSIS — M25562 Pain in left knee: Secondary | ICD-10-CM | POA: Diagnosis not present

## 2023-05-17 DIAGNOSIS — M25611 Stiffness of right shoulder, not elsewhere classified: Secondary | ICD-10-CM | POA: Diagnosis not present

## 2023-05-20 ENCOUNTER — Telehealth (INDEPENDENT_AMBULATORY_CARE_PROVIDER_SITE_OTHER): Payer: Self-pay | Admitting: Gastroenterology

## 2023-05-20 DIAGNOSIS — M25662 Stiffness of left knee, not elsewhere classified: Secondary | ICD-10-CM | POA: Diagnosis not present

## 2023-05-20 DIAGNOSIS — R531 Weakness: Secondary | ICD-10-CM | POA: Diagnosis not present

## 2023-05-20 DIAGNOSIS — M25562 Pain in left knee: Secondary | ICD-10-CM | POA: Diagnosis not present

## 2023-05-20 DIAGNOSIS — M25511 Pain in right shoulder: Secondary | ICD-10-CM | POA: Diagnosis not present

## 2023-05-20 DIAGNOSIS — M6281 Muscle weakness (generalized): Secondary | ICD-10-CM | POA: Diagnosis not present

## 2023-05-20 DIAGNOSIS — M25531 Pain in right wrist: Secondary | ICD-10-CM | POA: Diagnosis not present

## 2023-05-20 DIAGNOSIS — M25611 Stiffness of right shoulder, not elsewhere classified: Secondary | ICD-10-CM | POA: Diagnosis not present

## 2023-05-20 DIAGNOSIS — R2689 Other abnormalities of gait and mobility: Secondary | ICD-10-CM | POA: Diagnosis not present

## 2023-05-20 NOTE — Telephone Encounter (Signed)
Does pt need office visit before being scheduled for EGD/TCS. Encounter note from 02/01/23 requesting pt be scheduled for TCS/EGD. Pt was in hospital from 3/21-3/24 and per message on 02/14/23 from Tobi Bastos pt needs ov in about 4 weeks. Please advise. Thank you

## 2023-05-23 DIAGNOSIS — M6281 Muscle weakness (generalized): Secondary | ICD-10-CM | POA: Diagnosis not present

## 2023-05-23 DIAGNOSIS — M25611 Stiffness of right shoulder, not elsewhere classified: Secondary | ICD-10-CM | POA: Diagnosis not present

## 2023-05-23 DIAGNOSIS — M25531 Pain in right wrist: Secondary | ICD-10-CM | POA: Diagnosis not present

## 2023-05-23 DIAGNOSIS — R531 Weakness: Secondary | ICD-10-CM | POA: Diagnosis not present

## 2023-05-23 DIAGNOSIS — M25562 Pain in left knee: Secondary | ICD-10-CM | POA: Diagnosis not present

## 2023-05-23 DIAGNOSIS — R2689 Other abnormalities of gait and mobility: Secondary | ICD-10-CM | POA: Diagnosis not present

## 2023-05-23 DIAGNOSIS — M25511 Pain in right shoulder: Secondary | ICD-10-CM | POA: Diagnosis not present

## 2023-05-23 DIAGNOSIS — M25662 Stiffness of left knee, not elsewhere classified: Secondary | ICD-10-CM | POA: Diagnosis not present

## 2023-05-23 NOTE — Telephone Encounter (Signed)
Left message to return call  (ASA 3 ENDO 3)

## 2023-05-23 NOTE — Telephone Encounter (Signed)
Dr.Castaneda did last procedure, do I need to keep patient with him? Please advise. Thank you

## 2023-05-24 DIAGNOSIS — J449 Chronic obstructive pulmonary disease, unspecified: Secondary | ICD-10-CM | POA: Diagnosis not present

## 2023-05-27 DIAGNOSIS — M25562 Pain in left knee: Secondary | ICD-10-CM | POA: Diagnosis not present

## 2023-05-27 DIAGNOSIS — M25662 Stiffness of left knee, not elsewhere classified: Secondary | ICD-10-CM | POA: Diagnosis not present

## 2023-05-27 DIAGNOSIS — R2689 Other abnormalities of gait and mobility: Secondary | ICD-10-CM | POA: Diagnosis not present

## 2023-05-27 DIAGNOSIS — M6281 Muscle weakness (generalized): Secondary | ICD-10-CM | POA: Diagnosis not present

## 2023-05-27 DIAGNOSIS — R531 Weakness: Secondary | ICD-10-CM | POA: Diagnosis not present

## 2023-05-27 DIAGNOSIS — M25531 Pain in right wrist: Secondary | ICD-10-CM | POA: Diagnosis not present

## 2023-05-27 DIAGNOSIS — M25611 Stiffness of right shoulder, not elsewhere classified: Secondary | ICD-10-CM | POA: Diagnosis not present

## 2023-05-27 DIAGNOSIS — M25511 Pain in right shoulder: Secondary | ICD-10-CM | POA: Diagnosis not present

## 2023-05-30 DIAGNOSIS — R2689 Other abnormalities of gait and mobility: Secondary | ICD-10-CM | POA: Diagnosis not present

## 2023-05-30 DIAGNOSIS — M25562 Pain in left knee: Secondary | ICD-10-CM | POA: Diagnosis not present

## 2023-05-30 DIAGNOSIS — M6281 Muscle weakness (generalized): Secondary | ICD-10-CM | POA: Diagnosis not present

## 2023-05-30 DIAGNOSIS — M25662 Stiffness of left knee, not elsewhere classified: Secondary | ICD-10-CM | POA: Diagnosis not present

## 2023-06-01 DIAGNOSIS — M6281 Muscle weakness (generalized): Secondary | ICD-10-CM | POA: Diagnosis not present

## 2023-06-01 DIAGNOSIS — I10 Essential (primary) hypertension: Secondary | ICD-10-CM | POA: Diagnosis not present

## 2023-06-01 DIAGNOSIS — R2689 Other abnormalities of gait and mobility: Secondary | ICD-10-CM | POA: Diagnosis not present

## 2023-06-01 DIAGNOSIS — M25511 Pain in right shoulder: Secondary | ICD-10-CM | POA: Diagnosis not present

## 2023-06-01 DIAGNOSIS — R531 Weakness: Secondary | ICD-10-CM | POA: Diagnosis not present

## 2023-06-01 DIAGNOSIS — M25531 Pain in right wrist: Secondary | ICD-10-CM | POA: Diagnosis not present

## 2023-06-01 DIAGNOSIS — R64 Cachexia: Secondary | ICD-10-CM | POA: Diagnosis not present

## 2023-06-01 DIAGNOSIS — Z299 Encounter for prophylactic measures, unspecified: Secondary | ICD-10-CM | POA: Diagnosis not present

## 2023-06-01 DIAGNOSIS — M810 Age-related osteoporosis without current pathological fracture: Secondary | ICD-10-CM | POA: Diagnosis not present

## 2023-06-01 DIAGNOSIS — M25662 Stiffness of left knee, not elsewhere classified: Secondary | ICD-10-CM | POA: Diagnosis not present

## 2023-06-01 DIAGNOSIS — M25562 Pain in left knee: Secondary | ICD-10-CM | POA: Diagnosis not present

## 2023-06-01 DIAGNOSIS — M25611 Stiffness of right shoulder, not elsewhere classified: Secondary | ICD-10-CM | POA: Diagnosis not present

## 2023-06-01 DIAGNOSIS — E039 Hypothyroidism, unspecified: Secondary | ICD-10-CM | POA: Diagnosis not present

## 2023-06-02 ENCOUNTER — Ambulatory Visit (INDEPENDENT_AMBULATORY_CARE_PROVIDER_SITE_OTHER): Payer: 59 | Admitting: Orthopedic Surgery

## 2023-06-02 DIAGNOSIS — S82035D Nondisplaced transverse fracture of left patella, subsequent encounter for closed fracture with routine healing: Secondary | ICD-10-CM

## 2023-06-02 DIAGNOSIS — S42291D Other displaced fracture of upper end of right humerus, subsequent encounter for fracture with routine healing: Secondary | ICD-10-CM | POA: Diagnosis not present

## 2023-06-02 NOTE — Progress Notes (Signed)
Chief Complaint  Patient presents with   Shoulder Pain    Right shoulder pain and left knee pain    Encounter Diagnoses  Name Primary?   Closed nondisplaced transverse fracture of left patella with routine healing, subsequent encounter 02/10/23 Yes   Closed fracture of head of right humerus with routine healing, subsequent encounter 02/10/23    Kimberly [name] has notes regarding her shoulder from PT shoulder is improving her active flexion is just past 90 degrees in the scapular plane and the same with forward elevation  She is having some knee pain and shoulder pain and periscapular pain as well  Overall she is improving but slowly  Recommend continue physical therapy follow-up in 3 months expect a year before she gets full recovery

## 2023-06-03 DIAGNOSIS — M6281 Muscle weakness (generalized): Secondary | ICD-10-CM | POA: Diagnosis not present

## 2023-06-03 DIAGNOSIS — R2689 Other abnormalities of gait and mobility: Secondary | ICD-10-CM | POA: Diagnosis not present

## 2023-06-03 DIAGNOSIS — R531 Weakness: Secondary | ICD-10-CM | POA: Diagnosis not present

## 2023-06-03 DIAGNOSIS — M25531 Pain in right wrist: Secondary | ICD-10-CM | POA: Diagnosis not present

## 2023-06-03 DIAGNOSIS — M25611 Stiffness of right shoulder, not elsewhere classified: Secondary | ICD-10-CM | POA: Diagnosis not present

## 2023-06-03 DIAGNOSIS — M25562 Pain in left knee: Secondary | ICD-10-CM | POA: Diagnosis not present

## 2023-06-03 DIAGNOSIS — M25662 Stiffness of left knee, not elsewhere classified: Secondary | ICD-10-CM | POA: Diagnosis not present

## 2023-06-03 DIAGNOSIS — M25511 Pain in right shoulder: Secondary | ICD-10-CM | POA: Diagnosis not present

## 2023-06-06 DIAGNOSIS — M25511 Pain in right shoulder: Secondary | ICD-10-CM | POA: Diagnosis not present

## 2023-06-06 DIAGNOSIS — M25562 Pain in left knee: Secondary | ICD-10-CM | POA: Diagnosis not present

## 2023-06-06 DIAGNOSIS — R531 Weakness: Secondary | ICD-10-CM | POA: Diagnosis not present

## 2023-06-06 DIAGNOSIS — M25662 Stiffness of left knee, not elsewhere classified: Secondary | ICD-10-CM | POA: Diagnosis not present

## 2023-06-06 DIAGNOSIS — M25531 Pain in right wrist: Secondary | ICD-10-CM | POA: Diagnosis not present

## 2023-06-06 DIAGNOSIS — R2689 Other abnormalities of gait and mobility: Secondary | ICD-10-CM | POA: Diagnosis not present

## 2023-06-06 DIAGNOSIS — M6281 Muscle weakness (generalized): Secondary | ICD-10-CM | POA: Diagnosis not present

## 2023-06-06 DIAGNOSIS — M25611 Stiffness of right shoulder, not elsewhere classified: Secondary | ICD-10-CM | POA: Diagnosis not present

## 2023-06-08 MED ORDER — CLENPIQ 10-3.5-12 MG-GM -GM/175ML PO SOLN: 1.0000 | ORAL | 0 refills | Status: DC

## 2023-06-08 NOTE — Addendum Note (Signed)
Addended by: Marlowe Shores on: 06/08/2023 08:16 AM   Modules accepted: Orders

## 2023-06-08 NOTE — Telephone Encounter (Signed)
Pt contacted and EGD/TCS scheduled for 06/28/23 @ 12:30pm with Dr.Castaneda. instructions sent via mychart. Will call pt with pre op.  Per [facility] Notification or Prior Authorization is not required for the requested services You are not required to submit a notification/prior authorization based on the information provided. If you have general questions about the prior authorization requirements, visit [email or web address] > Clinician Resources > Advance and Admission Notification Requirements. The number above acknowledges your notification. Please write this reference number down for future reference. If you would like to request an organization determination, please call us at [phone]. Decision ID #: [id number]

## 2023-06-10 DIAGNOSIS — M6281 Muscle weakness (generalized): Secondary | ICD-10-CM | POA: Diagnosis not present

## 2023-06-10 DIAGNOSIS — M25611 Stiffness of right shoulder, not elsewhere classified: Secondary | ICD-10-CM | POA: Diagnosis not present

## 2023-06-10 DIAGNOSIS — R531 Weakness: Secondary | ICD-10-CM | POA: Diagnosis not present

## 2023-06-10 DIAGNOSIS — M25531 Pain in right wrist: Secondary | ICD-10-CM | POA: Diagnosis not present

## 2023-06-10 DIAGNOSIS — M25562 Pain in left knee: Secondary | ICD-10-CM | POA: Diagnosis not present

## 2023-06-10 DIAGNOSIS — M25511 Pain in right shoulder: Secondary | ICD-10-CM | POA: Diagnosis not present

## 2023-06-10 DIAGNOSIS — M25662 Stiffness of left knee, not elsewhere classified: Secondary | ICD-10-CM | POA: Diagnosis not present

## 2023-06-10 DIAGNOSIS — R2689 Other abnormalities of gait and mobility: Secondary | ICD-10-CM | POA: Diagnosis not present

## 2023-06-12 DIAGNOSIS — Z9884 Bariatric surgery status: Secondary | ICD-10-CM | POA: Diagnosis not present

## 2023-06-12 DIAGNOSIS — J811 Chronic pulmonary edema: Secondary | ICD-10-CM | POA: Diagnosis not present

## 2023-06-12 DIAGNOSIS — R0789 Other chest pain: Secondary | ICD-10-CM | POA: Diagnosis not present

## 2023-06-12 DIAGNOSIS — I509 Heart failure, unspecified: Secondary | ICD-10-CM | POA: Diagnosis not present

## 2023-06-12 DIAGNOSIS — K449 Diaphragmatic hernia without obstruction or gangrene: Secondary | ICD-10-CM | POA: Diagnosis not present

## 2023-06-12 DIAGNOSIS — Z8719 Personal history of other diseases of the digestive system: Secondary | ICD-10-CM | POA: Diagnosis not present

## 2023-06-12 DIAGNOSIS — R0781 Pleurodynia: Secondary | ICD-10-CM | POA: Diagnosis not present

## 2023-06-12 DIAGNOSIS — Z882 Allergy status to sulfonamides status: Secondary | ICD-10-CM | POA: Diagnosis not present

## 2023-06-12 DIAGNOSIS — G4730 Sleep apnea, unspecified: Secondary | ICD-10-CM | POA: Diagnosis not present

## 2023-06-12 DIAGNOSIS — I517 Cardiomegaly: Secondary | ICD-10-CM | POA: Diagnosis not present

## 2023-06-12 DIAGNOSIS — I110 Hypertensive heart disease with heart failure: Secondary | ICD-10-CM | POA: Diagnosis not present

## 2023-06-12 DIAGNOSIS — E119 Type 2 diabetes mellitus without complications: Secondary | ICD-10-CM | POA: Diagnosis not present

## 2023-06-12 DIAGNOSIS — Z885 Allergy status to narcotic agent status: Secondary | ICD-10-CM | POA: Diagnosis not present

## 2023-06-13 DIAGNOSIS — R0781 Pleurodynia: Secondary | ICD-10-CM | POA: Diagnosis not present

## 2023-06-13 DIAGNOSIS — I517 Cardiomegaly: Secondary | ICD-10-CM | POA: Diagnosis not present

## 2023-06-13 DIAGNOSIS — J811 Chronic pulmonary edema: Secondary | ICD-10-CM | POA: Diagnosis not present

## 2023-06-13 DIAGNOSIS — K449 Diaphragmatic hernia without obstruction or gangrene: Secondary | ICD-10-CM | POA: Diagnosis not present

## 2023-06-14 ENCOUNTER — Telehealth: Payer: Self-pay | Admitting: *Deleted

## 2023-06-14 NOTE — Progress Notes (Signed)
  Care Coordination   Note   06/14/2023 Name: Kimberly [name] MRN: [id number] DOB: [birth year]  Kimberly [name] is a 67 y.o. year old female who sees Vyas, Angelina Pih, MD for primary care. I reached out to [name] by phone today to offer care coordination services.  Ms. Belcourt was given information about Care Coordination services today including:   The Care Coordination services include support from the care team which includes your Nurse Coordinator, Clinical Social Worker, or Pharmacist.  The Care Coordination team is here to help remove barriers to the health concerns and goals most important to you. Care Coordination services are voluntary, and the patient may decline or stop services at any time by request to their care team member.   Care Coordination Consent Status: Patient agreed to services and verbal consent obtained.   Follow up plan:  Telephone appointment with care coordination team member scheduled for:  06/29/23  Encounter Outcome:  Pt. Scheduled  [facility] Coordination Care Guide  Direct Dial: [phone]

## 2023-06-15 ENCOUNTER — Encounter (INDEPENDENT_AMBULATORY_CARE_PROVIDER_SITE_OTHER): Payer: Self-pay

## 2023-06-15 DIAGNOSIS — S82092S Other fracture of left patella, sequela: Secondary | ICD-10-CM | POA: Diagnosis not present

## 2023-06-15 DIAGNOSIS — J449 Chronic obstructive pulmonary disease, unspecified: Secondary | ICD-10-CM | POA: Diagnosis not present

## 2023-06-16 DIAGNOSIS — M25531 Pain in right wrist: Secondary | ICD-10-CM | POA: Diagnosis not present

## 2023-06-16 DIAGNOSIS — M25511 Pain in right shoulder: Secondary | ICD-10-CM | POA: Diagnosis not present

## 2023-06-16 DIAGNOSIS — R531 Weakness: Secondary | ICD-10-CM | POA: Diagnosis not present

## 2023-06-16 DIAGNOSIS — M25611 Stiffness of right shoulder, not elsewhere classified: Secondary | ICD-10-CM | POA: Diagnosis not present

## 2023-06-17 DIAGNOSIS — M25562 Pain in left knee: Secondary | ICD-10-CM | POA: Diagnosis not present

## 2023-06-17 DIAGNOSIS — R0789 Other chest pain: Secondary | ICD-10-CM | POA: Diagnosis not present

## 2023-06-17 DIAGNOSIS — M6281 Muscle weakness (generalized): Secondary | ICD-10-CM | POA: Diagnosis not present

## 2023-06-17 DIAGNOSIS — Z299 Encounter for prophylactic measures, unspecified: Secondary | ICD-10-CM | POA: Diagnosis not present

## 2023-06-17 DIAGNOSIS — M25662 Stiffness of left knee, not elsewhere classified: Secondary | ICD-10-CM | POA: Diagnosis not present

## 2023-06-17 DIAGNOSIS — I10 Essential (primary) hypertension: Secondary | ICD-10-CM | POA: Diagnosis not present

## 2023-06-17 DIAGNOSIS — R2689 Other abnormalities of gait and mobility: Secondary | ICD-10-CM | POA: Diagnosis not present

## 2023-06-21 NOTE — Patient Instructions (Signed)
DULA KERESTES  06/21/2023     @PREFPERIOPPHARMACY @   Your procedure is scheduled on  06/28/2023.   Report to [facility] at  1045  A.M.   Call this number if you have problems the morning of surgery:  [phone]  If you experience any cold or flu symptoms such as cough, fever, chills, shortness of breath, etc. between now and your scheduled surgery, please notify us at the above number.   Remember:  Follow the diet and prep instructions given to you by the office.     Use your inhalers before you come and bring your rescue inhaler with you.     Take these medicines the morning of surgery with A SIP OF WATER          flexeril(if needed), pepcid, hydroxyzine(if needed), levothyroxine, zofran (if needed), oxycodone(if needed), pantoprazole.     Do not wear jewelry, make-up or nail polish, including gel polish,  artificial nails, or any other type of covering on natural nails (fingers and  toes).  Do not wear lotions, powders, or perfumes, or deodorant.  Do not shave 48 hours prior to surgery.  Men may shave face and neck.  Do not bring valuables to the hospital.  [facility] is not responsible for any belongings or valuables.  Contacts, dentures or bridgework may not be worn into surgery.  Leave your suitcase in the car.  After surgery it may be brought to your room.  For patients admitted to the hospital, discharge time will be determined by your treatment team.  Patients discharged the day of surgery will not be allowed to drive home and must have someone with them for 24 hours.    Special instructions:   DO NOT smoke tobacco or vape for 24 hours before your procedure.  Please read over the following fact sheets that you were given. Anesthesia Post-op Instructions and Care and Recovery After Surgery      Upper Endoscopy, Adult, Care After After the procedure, it is common to have a sore throat. It is also common to have: Mild stomach pain or  discomfort. Bloating. Nausea. Follow these instructions at home: The instructions below may help you care for yourself at home. Your health care provider may give you more instructions. If you have questions, ask your health care provider. If you were given a sedative during the procedure, it can affect you for several hours. Do not drive or operate machinery until your health care provider says that it is safe. If you will be going home right after the procedure, plan to have a responsible adult: Take you home from the hospital or clinic. You will not be allowed to drive. Care for you for the time you are told. Follow instructions from your health care provider about what you may eat and drink. Return to your normal activities as told by your health care provider. Ask your health care provider what activities are safe for you. Take over-the-counter and prescription medicines only as told by your health care provider. Contact a health care provider if you: Have a sore throat that lasts longer than one day. Have trouble swallowing. Have a fever. Get help right away if you: Vomit blood or your vomit looks like coffee grounds. Have bloody, black, or tarry stools. Have a very bad sore throat or you cannot swallow. Have difficulty breathing or very bad pain in your chest or abdomen. These symptoms may be an emergency. Get  help right away. Call 911. Do not wait to see if the symptoms will go away. Do not drive yourself to the hospital. Summary After the procedure, it is common to have a sore throat, mild stomach discomfort, bloating, and nausea. If you were given a sedative during the procedure, it can affect you for several hours. Do not drive until your health care provider says that it is safe. Follow instructions from your health care provider about what you may eat and drink. Return to your normal activities as told by your health care provider. This information is not intended to replace  advice given to you by your health care provider. Make sure you discuss any questions you have with your health care provider. Document Revised: 02/17/2022 Document Reviewed: 02/17/2022 Elsevier Patient Education  2024 [employer]. Colonoscopy, Adult, Care After The following information offers guidance on how to care for yourself after your procedure. Your health care provider may also give you more specific instructions. If you have problems or questions, contact your health care provider. What can I expect after the procedure? After the procedure, it is common to have: A small amount of blood in your stool for 24 hours after the procedure. Some gas. Mild cramping or bloating of your abdomen. Follow these instructions at home: Eating and drinking  Drink enough fluid to keep your urine pale yellow. Follow instructions from your health care provider about eating or drinking restrictions. Resume your normal diet as told by your health care provider. Avoid heavy or fried foods that are hard to digest. Activity Rest as told by your health care provider. Avoid sitting for a long time without moving. Get up to take short walks every 1-2 hours. This is important to improve blood flow and breathing. Ask for help if you feel weak or unsteady. Return to your normal activities as told by your health care provider. Ask your health care provider what activities are safe for you. Managing cramping and bloating  Try walking around when you have cramps or feel bloated. If directed, apply heat to your abdomen as told by your health care provider. Use the heat source that your health care provider recommends, such as a moist heat pack or a heating pad. Place a towel between your skin and the heat source. Leave the heat on for 20-30 minutes. Remove the heat if your skin turns bright red. This is especially important if you are unable to feel pain, heat, or cold. You have a greater risk of getting  burned. General instructions If you were given a sedative during the procedure, it can affect you for several hours. Do not drive or operate machinery until your health care provider says that it is safe. For the first 24 hours after the procedure: Do not sign important documents. Do not drink alcohol. Do your regular daily activities at a slower pace than normal. Eat soft foods that are easy to digest. Take over-the-counter and prescription medicines only as told by your health care provider. Keep all follow-up visits. This is important. Contact a health care provider if: You have blood in your stool 2-3 days after the procedure. Get help right away if: You have more than a small spotting of blood in your stool. You have large blood clots in your stool. You have swelling of your abdomen. You have nausea or vomiting. You have a fever. You have increasing pain in your abdomen that is not relieved with medicine. These symptoms may be an emergency. Get  help right away. Call 911. Do not wait to see if the symptoms will go away. Do not drive yourself to the hospital. Summary After the procedure, it is common to have a small amount of blood in your stool. You may also have mild cramping and bloating of your abdomen. If you were given a sedative during the procedure, it can affect you for several hours. Do not drive or operate machinery until your health care provider says that it is safe. Get help right away if you have a lot of blood in your stool, nausea or vomiting, a fever, or increased pain in your abdomen. This information is not intended to replace advice given to you by your health care provider. Make sure you discuss any questions you have with your health care provider. Document Revised: 12/21/2022 Document Reviewed: 07/01/2021 Elsevier Patient Education  2024 Elsevier Inc. Monitored Anesthesia Care, Care After The following information offers guidance on how to care for yourself  after your procedure. Your health care provider may also give you more specific instructions. If you have problems or questions, contact your health care provider. What can I expect after the procedure? After the procedure, it is common to have: Tiredness. Little or no memory about what happened during or after the procedure. Impaired judgment when it comes to making decisions. Nausea or vomiting. Some trouble with balance. Follow these instructions at home: For the time period you were told by your health care provider:  Rest. Do not participate in activities where you could fall or become injured. Do not drive or use machinery. Do not drink alcohol. Do not take sleeping pills or medicines that cause drowsiness. Do not make important decisions or sign legal documents. Do not take care of children on your own. Medicines Take over-the-counter and prescription medicines only as told by your health care provider. If you were prescribed antibiotics, take them as told by your health care provider. Do not stop using the antibiotic even if you start to feel better. Eating and drinking Follow instructions from your health care provider about what you may eat and drink. Drink enough fluid to keep your urine pale yellow. If you vomit: Drink clear fluids slowly and in small amounts as you are able. Clear fluids include water, ice chips, low-calorie sports drinks, and fruit juice that has water added to it (diluted fruit juice). Eat light and bland foods in small amounts as you are able. These foods include bananas, applesauce, rice, lean meats, toast, and crackers. General instructions  Have a responsible adult stay with you for the time you are told. It is important to have someone help care for you until you are awake and alert. If you have sleep apnea, surgery and some medicines can increase your risk for breathing problems. Follow instructions from your health care provider about wearing your  sleep device: When you are sleeping. This includes during daytime naps. While taking prescription pain medicines, sleeping medicines, or medicines that make you drowsy. Do not use any products that contain nicotine or tobacco. These products include cigarettes, chewing tobacco, and vaping devices, such as e-cigarettes. If you need help quitting, ask your health care provider. Contact a health care provider if: You feel nauseous or vomit every time you eat or drink. You feel light-headed. You are still sleepy or having trouble with balance after 24 hours. You get a rash. You have a fever. You have redness or swelling around the IV site. Get help right away if: You  have trouble breathing. You have new confusion after you get home. These symptoms may be an emergency. Get help right away. Call 911. Do not wait to see if the symptoms will go away. Do not drive yourself to the hospital. This information is not intended to replace advice given to you by your health care provider. Make sure you discuss any questions you have with your health care provider. Document Revised: 04/05/2022 Document Reviewed: 04/05/2022 Elsevier Patient Education  2024 [employer].

## 2023-06-22 DIAGNOSIS — M25562 Pain in left knee: Secondary | ICD-10-CM | POA: Diagnosis not present

## 2023-06-22 DIAGNOSIS — M6281 Muscle weakness (generalized): Secondary | ICD-10-CM | POA: Diagnosis not present

## 2023-06-22 DIAGNOSIS — M25662 Stiffness of left knee, not elsewhere classified: Secondary | ICD-10-CM | POA: Diagnosis not present

## 2023-06-22 DIAGNOSIS — M810 Age-related osteoporosis without current pathological fracture: Secondary | ICD-10-CM | POA: Diagnosis not present

## 2023-06-22 DIAGNOSIS — R2689 Other abnormalities of gait and mobility: Secondary | ICD-10-CM | POA: Diagnosis not present

## 2023-06-22 DIAGNOSIS — M25531 Pain in right wrist: Secondary | ICD-10-CM | POA: Diagnosis not present

## 2023-06-22 DIAGNOSIS — M25511 Pain in right shoulder: Secondary | ICD-10-CM | POA: Diagnosis not present

## 2023-06-22 DIAGNOSIS — M25611 Stiffness of right shoulder, not elsewhere classified: Secondary | ICD-10-CM | POA: Diagnosis not present

## 2023-06-22 DIAGNOSIS — R531 Weakness: Secondary | ICD-10-CM | POA: Diagnosis not present

## 2023-06-23 ENCOUNTER — Encounter (HOSPITAL_COMMUNITY): Payer: Self-pay

## 2023-06-23 ENCOUNTER — Encounter (HOSPITAL_COMMUNITY)
Admission: RE | Admit: 2023-06-23 | Discharge: 2023-06-23 | Disposition: A | Discharge status: Home / Self Care | Payer: 59 | Source: Ambulatory Visit | Attending: Gastroenterology | Admitting: Gastroenterology

## 2023-06-23 ENCOUNTER — Other Ambulatory Visit: Payer: Self-pay

## 2023-06-23 VITALS — BP 115/62 | HR 67 | Temp 97.6°F | Resp 18 | Ht 59.75 in | Wt 135.0 lb

## 2023-06-23 DIAGNOSIS — E119 Type 2 diabetes mellitus without complications: Secondary | ICD-10-CM | POA: Diagnosis not present

## 2023-06-23 DIAGNOSIS — I10 Essential (primary) hypertension: Secondary | ICD-10-CM | POA: Diagnosis not present

## 2023-06-23 DIAGNOSIS — Z01818 Encounter for other preprocedural examination: Secondary | ICD-10-CM | POA: Diagnosis not present

## 2023-06-23 DIAGNOSIS — Z794 Long term (current) use of insulin: Secondary | ICD-10-CM | POA: Insufficient documentation

## 2023-06-23 LAB — BASIC METABOLIC PANEL
Anion gap: 8 (ref 5–15)
BUN: 19 mg/dL (ref 8–23)
CO2: 25 mmol/L (ref 22–32)
Calcium: 8.5 mg/dL — ABNORMAL LOW (ref 8.9–10.3)
Chloride: 105 mmol/L (ref 98–111)
Creatinine, Ser: 1.06 mg/dL — ABNORMAL HIGH (ref 0.44–1.00)
GFR, Estimated: 58 mL/min — ABNORMAL LOW (ref >60)
Glucose, Bld: 76 mg/dL (ref 70–99)
Potassium: 4.1 mmol/L (ref 3.5–5.1)
Sodium: 138 mmol/L (ref 135–145)

## 2023-06-24 DIAGNOSIS — J449 Chronic obstructive pulmonary disease, unspecified: Secondary | ICD-10-CM | POA: Diagnosis not present

## 2023-06-27 DIAGNOSIS — R531 Weakness: Secondary | ICD-10-CM | POA: Diagnosis not present

## 2023-06-27 DIAGNOSIS — M25562 Pain in left knee: Secondary | ICD-10-CM | POA: Diagnosis not present

## 2023-06-27 DIAGNOSIS — M25531 Pain in right wrist: Secondary | ICD-10-CM | POA: Diagnosis not present

## 2023-06-27 DIAGNOSIS — M6281 Muscle weakness (generalized): Secondary | ICD-10-CM | POA: Diagnosis not present

## 2023-06-27 DIAGNOSIS — M25511 Pain in right shoulder: Secondary | ICD-10-CM | POA: Diagnosis not present

## 2023-06-27 DIAGNOSIS — M25662 Stiffness of left knee, not elsewhere classified: Secondary | ICD-10-CM | POA: Diagnosis not present

## 2023-06-27 DIAGNOSIS — R2689 Other abnormalities of gait and mobility: Secondary | ICD-10-CM | POA: Diagnosis not present

## 2023-06-27 DIAGNOSIS — M25611 Stiffness of right shoulder, not elsewhere classified: Secondary | ICD-10-CM | POA: Diagnosis not present

## 2023-06-28 ENCOUNTER — Ambulatory Visit (HOSPITAL_COMMUNITY): Payer: Self-pay | Admitting: Anesthesiology

## 2023-06-28 ENCOUNTER — Encounter (HOSPITAL_COMMUNITY): Payer: Self-pay | Admitting: Gastroenterology

## 2023-06-28 ENCOUNTER — Encounter (HOSPITAL_COMMUNITY)
Admission: RE | Disposition: A | Discharge status: Home / Self Care | Payer: Self-pay | Source: Home / Self Care | Attending: Gastroenterology

## 2023-06-28 ENCOUNTER — Encounter (INDEPENDENT_AMBULATORY_CARE_PROVIDER_SITE_OTHER): Payer: Self-pay | Admitting: *Deleted

## 2023-06-28 ENCOUNTER — Ambulatory Visit (HOSPITAL_COMMUNITY)
Admission: RE | Admit: 2023-06-28 | Discharge: 2023-06-28 | Disposition: A | Discharge status: Home / Self Care | Payer: 59 | Attending: Gastroenterology | Admitting: Gastroenterology

## 2023-06-28 ENCOUNTER — Ambulatory Visit (HOSPITAL_BASED_OUTPATIENT_CLINIC_OR_DEPARTMENT_OTHER): Payer: 59 | Admitting: Anesthesiology

## 2023-06-28 DIAGNOSIS — E119 Type 2 diabetes mellitus without complications: Secondary | ICD-10-CM | POA: Diagnosis not present

## 2023-06-28 DIAGNOSIS — I5032 Chronic diastolic (congestive) heart failure: Secondary | ICD-10-CM | POA: Diagnosis not present

## 2023-06-28 DIAGNOSIS — I110 Hypertensive heart disease with heart failure: Secondary | ICD-10-CM

## 2023-06-28 DIAGNOSIS — Z800 Family history of malignant neoplasm of digestive organs: Secondary | ICD-10-CM | POA: Diagnosis not present

## 2023-06-28 DIAGNOSIS — Z9884 Bariatric surgery status: Secondary | ICD-10-CM | POA: Diagnosis not present

## 2023-06-28 DIAGNOSIS — K7581 Nonalcoholic steatohepatitis (NASH): Secondary | ICD-10-CM | POA: Insufficient documentation

## 2023-06-28 DIAGNOSIS — K922 Gastrointestinal hemorrhage, unspecified: Secondary | ICD-10-CM

## 2023-06-28 DIAGNOSIS — K580 Irritable bowel syndrome with diarrhea: Secondary | ICD-10-CM | POA: Insufficient documentation

## 2023-06-28 DIAGNOSIS — Z8673 Personal history of transient ischemic attack (TIA), and cerebral infarction without residual deficits: Secondary | ICD-10-CM | POA: Insufficient documentation

## 2023-06-28 DIAGNOSIS — I2510 Atherosclerotic heart disease of native coronary artery without angina pectoris: Secondary | ICD-10-CM | POA: Diagnosis not present

## 2023-06-28 DIAGNOSIS — E039 Hypothyroidism, unspecified: Secondary | ICD-10-CM | POA: Diagnosis not present

## 2023-06-28 DIAGNOSIS — Z1211 Encounter for screening for malignant neoplasm of colon: Secondary | ICD-10-CM

## 2023-06-28 DIAGNOSIS — Z13810 Encounter for screening for upper gastrointestinal disorder: Secondary | ICD-10-CM | POA: Diagnosis not present

## 2023-06-28 DIAGNOSIS — K7460 Unspecified cirrhosis of liver: Secondary | ICD-10-CM | POA: Insufficient documentation

## 2023-06-28 DIAGNOSIS — I509 Heart failure, unspecified: Secondary | ICD-10-CM | POA: Diagnosis not present

## 2023-06-28 HISTORY — PX: ESOPHAGOGASTRODUODENOSCOPY (EGD) WITH PROPOFOL: SHX5813

## 2023-06-28 HISTORY — PX: COLONOSCOPY WITH PROPOFOL: SHX5780

## 2023-06-28 LAB — HM COLONOSCOPY

## 2023-06-28 SURGERY — ESOPHAGOGASTRODUODENOSCOPY (EGD) WITH PROPOFOL
Anesthesia: General

## 2023-06-28 MED ORDER — EPHEDRINE SULFATE-NACL 50-0.9 MG/10ML-% IV SOSY
PREFILLED_SYRINGE | INTRAVENOUS | Status: DC | PRN
  Administered 2023-06-28: 10 mg via INTRAVENOUS
  Administered 2023-06-28: 5 mg via INTRAVENOUS
  Administered 2023-06-28: 10 mg via INTRAVENOUS

## 2023-06-28 MED ORDER — LACTATED RINGERS IV SOLN: INTRAVENOUS | Status: DC

## 2023-06-28 MED ORDER — LIDOCAINE HCL (CARDIAC) PF 100 MG/5ML IV SOSY: PREFILLED_SYRINGE | INTRAVENOUS | Status: DC | PRN

## 2023-06-28 MED ORDER — PHENYLEPHRINE 80 MCG/ML (10ML) SYRINGE FOR IV PUSH (FOR BLOOD PRESSURE SUPPORT)
PREFILLED_SYRINGE | INTRAVENOUS | Status: DC | PRN
  Administered 2023-06-28 (×3): 160 ug via INTRAVENOUS
  Administered 2023-06-28: 80 ug via INTRAVENOUS
  Administered 2023-06-28 (×2): 160 ug via INTRAVENOUS
  Administered 2023-06-28: 80 ug via INTRAVENOUS

## 2023-06-28 MED ORDER — ONDANSETRON HCL 4 MG/2ML IJ SOLN: 4.0000 mg | Freq: Once | INTRAMUSCULAR | Status: AC

## 2023-06-28 MED ORDER — PROPOFOL 10 MG/ML IV BOLUS
INTRAVENOUS | Status: DC | PRN
  Administered 2023-06-28: 80 mg via INTRAVENOUS

## 2023-06-28 MED ORDER — PROPOFOL 500 MG/50ML IV EMUL
INTRAVENOUS | Status: AC
  Filled 2023-06-28: qty 50

## 2023-06-28 MED ORDER — PHENYLEPHRINE 80 MCG/ML (10ML) SYRINGE FOR IV PUSH (FOR BLOOD PRESSURE SUPPORT)
PREFILLED_SYRINGE | INTRAVENOUS | Status: AC
  Filled 2023-06-28: qty 10

## 2023-06-28 MED ORDER — PROPOFOL 500 MG/50ML IV EMUL
INTRAVENOUS | Status: DC | PRN
  Administered 2023-06-28: 150 ug/kg/min via INTRAVENOUS

## 2023-06-28 MED ORDER — ONDANSETRON HCL 4 MG/2ML IJ SOLN
INTRAMUSCULAR | Status: AC
  Administered 2023-06-28: 4 mg via INTRAVENOUS
  Filled 2023-06-28: qty 2

## 2023-06-28 MED ORDER — EPHEDRINE 5 MG/ML INJ
INTRAVENOUS | Status: AC
  Filled 2023-06-28: qty 5

## 2023-06-28 NOTE — Op Note (Signed)
[facility] Patient Name: Kimberly [name] Procedure Date: 06/28/2023 12:12 PM MRN: [id number] Date of Birth: [birth year] Attending MD: Katrinka Blazing , , [id number] CSN: [id number] Age: 67 Admit Type: Outpatient Procedure:                Colonoscopy Indications:              Screening patient at increased risk: Family history                            of colorectal cancer in multiple 1st-degree                            relatives Providers:                Katrinka Blazing, Edrick Kins, RN, Elinor Parkinson, Dyann Ruddle Referring MD:              Medicines:                Monitored Anesthesia Care Complications:            No immediate complications. Estimated Blood Loss:     Estimated blood loss: none. Procedure:                Pre-Anesthesia Assessment:                           - Prior to the procedure, a History and Physical                            was performed, and patient medications, allergies                            and sensitivities were reviewed. The patient's                            tolerance of previous anesthesia was reviewed.                           - The risks and benefits of the procedure and the                            sedation options and risks were discussed with the                            patient. All questions were answered and informed                            consent was obtained.                           - ASA Grade Assessment: III - A patient with severe                            systemic disease.  After obtaining informed consent, the colonoscope                            was passed under direct vision. Throughout the                            procedure, the patient's blood pressure, pulse, and                            oxygen saturations were monitored continuously. The                            [id number]-[id number] ([id number]) scope was introduced through                             the anus and advanced to the the terminal ileum.                            The colonoscopy was performed without difficulty.                            The patient tolerated the procedure well. The                            quality of the bowel preparation was adequate. Scope In: 1:12:00 PM Scope Out: 1:33:58 PM Scope Withdrawal Time: 0 hours 17 minutes 16 seconds  Total Procedure Duration: 0 hours 21 minutes 58 seconds  Findings:      The perianal and digital rectal examinations were normal.      The terminal ileum appeared normal.      The entire examined colon appeared normal.      The retroflexed view of the distal rectum and anal verge was normal and       showed no anal or rectal abnormalities. Impression:               - The examined portion of the ileum was normal.                           - The entire examined colon is normal.                           - The distal rectum and anal verge are normal on                            retroflexion view.                           - No specimens collected. Moderate Sedation:      Per Anesthesia Care Recommendation:           - Discharge patient to home (ambulatory).                           - Resume previous diet.                           -  Repeat colonoscopy in 5 years for screening                            purposes. Procedure Code(s):        --- Professional ---                           Z3664, Colorectal cancer screening; colonoscopy on                            individual at high risk Diagnosis Code(s):        --- Professional ---                           Z80.0, Family history of malignant neoplasm of                            digestive organs CPT copyright 2022 American [employer] Association. All rights reserved. The codes documented in this report are preliminary and upon coder review may  be revised to meet current compliance requirements. Katrinka Blazing, MD Katrinka Blazing,  06/28/2023 1:48:20 PM This report has  been signed electronically. Number of Addenda: 0

## 2023-06-28 NOTE — Anesthesia Postprocedure Evaluation (Signed)
Anesthesia Post Note  Patient: Kimberly [name]  Procedure(s) Performed: ESOPHAGOGASTRODUODENOSCOPY (EGD) WITH PROPOFOL COLONOSCOPY WITH PROPOFOL  Patient location during evaluation: Phase II Anesthesia Type: General Level of consciousness: awake and alert and oriented Pain management: pain level controlled Vital Signs Assessment: post-procedure vital signs reviewed and stable Respiratory status: spontaneous breathing, nonlabored ventilation and respiratory function stable Cardiovascular status: blood pressure returned to baseline and stable Postop Assessment: no apparent nausea or vomiting Anesthetic complications: no  No notable events documented.   Last Vitals:  Vitals:   06/28/23 1058 06/28/23 1337  BP: 123/63 (!) 125/52  Pulse: 63 71  Resp: 14 20  Temp: 36.6 C 36.6 C  SpO2: 100% 100%    Last Pain:  Vitals:   06/28/23 1337  TempSrc: Axillary  PainSc: Asleep                  C 

## 2023-06-28 NOTE — Discharge Instructions (Addendum)
You are being discharged to home.  Resume your previous diet.  Your physician has recommended a repeat upper endoscopy in three years for screening purposes.  Your physician has recommended a repeat colonoscopy in five years for screening purposes.

## 2023-06-28 NOTE — H&P (Addendum)
Kimberly [name] is an 67 y.o. female.   Chief Complaint: screening esophageal varices and colorectal cancer, family history of colon cancer HPI: Kimberly [name] is a 67 y.o. female with past medical history of compensated NASH cirrhosis, CHF, coronary artery disease, depression, diabetes, COPD, IBS-D, hypothyroidism, hypertension, OSA, stroke, seizures, obesity status post duodenal switch, macular degeneration, comes for screening of esophageal varices and colorectal cancer.   The patient denies having any complaints such as melena, hematochezia, abdominal pain or distention, change in her bowel movement consistency or frequency, no changes in weight recently.  Brothers x2 had colon cancer   Past Medical History:  Diagnosis Date   Anemia    PMH: as a child   Arthritis    Asthma    CAD (coronary artery disease)    nonobstructive by cath, 6/08 (false positive Cardiolite) normal stress echo, 7/11   CHF (congestive heart failure) (HCC)    Chronic back pain    Chronic bronchitis (HCC)    Cirrhosis (HCC)    Complication of anesthesia    had an asthma attack when woke up from procedure   Complication of anesthesia    asthma attack with one surgery   COPD (chronic obstructive pulmonary disease) (HCC)    Degenerative joint disease    Depression    DJD (degenerative joint disease)    Fatty liver    Fibromyalgia    GERD (gastroesophageal reflux disease)    Glaucoma    Headache(784.0)    Heart failure, diastolic, chronic (HCC)    Heart murmur    PMH:As a child only   Hernia of abdominal cavity    "upper and lower hernia"   History of hiatal hernia    HTN (hypertension)    Hypertension    Hypothyroidism    IBS (irritable bowel syndrome)    Morbid obesity (HCC)    Neuropathy    associated with diabetes   Obstructive sleep apnea    Pancreatitis    Pinched nerve in neck    Pneumonia    as a child   PONV (postoperative nausea and vomiting)    Pulmonary hypertension (HCC)     Rheumatic fever    PMH: as a child   Seizures (HCC)    PMH: only as a child   Seizures (HCC)    in childhood   Shortness of breath    Sleep apnea    Spinal stenosis    Stroke (HCC)    12/09/2013    Past Surgical History:  Procedure Laterality Date   ABDOMINAL HYSTERECTOMY     ABDOMINAL HYSTERECTOMY     partial hysterectomy   APPENDECTOMY     BACK SURGERY     BACK SURGERY     disc surgery with complications and damage to right side   BARIATRIC SURGERY     DS in June 2020   BILATERAL KNEE ARTHROSCOPY  2000, 2009   BIOPSY  06/16/2021   Procedure: BIOPSY;  Surgeon: Dolores Frame, MD;  Location: [facility];  Service: Gastroenterology;;   BREAST CYST EXCISION Left    CARDIAC CATHETERIZATION     2009   CATARACT EXTRACTION W/ INTRAOCULAR LENS  IMPLANT, BILATERAL     CHOLECYSTECTOMY  11/22/1992   CHOLECYSTECTOMY     COLONOSCOPY N/A 08/01/2013   Procedure: COLONOSCOPY;  Surgeon: Malissa Hippo, MD;  Location: [facility];  Service: Endoscopy;  Laterality: N/A;  930   COLONOSCOPY N/A 08/03/2017   Procedure: COLONOSCOPY;  Surgeon:  Malissa Hippo, MD;  Location: [facility];  Service: Endoscopy;  Laterality: N/A;  12:00   CYST EXCISION     Left breast   ESOPHAGOGASTRODUODENOSCOPY (EGD) WITH PROPOFOL N/A 06/16/2021   Procedure: ESOPHAGOGASTRODUODENOSCOPY (EGD) WITH PROPOFOL;  Surgeon: Dolores Frame, MD;  Location: [facility];  Service: Gastroenterology;  Laterality: N/A;  12:50   EXCISIONAL HEMORRHOIDECTOMY     EYE SURGERY  11/22/1965   EYE SURGERY     bilateral cataract removal with lens implants   EYE SURGERY     growth removed from right eye lid   EYE SURGERY     bilateral eye surgery age 77 t"to straighten eyes'   heel (other)  11/23/2003   HEEL SPUR SURGERY     Right heel - growth removal and rebuilding of heel   HEMORRHOID SURGERY     HERNIA REPAIR     KNEE ARTHROSCOPY Bilateral    LUMBAR LAMINECTOMY/DECOMPRESSION MICRODISCECTOMY Right  08/23/2014   Procedure: LUMBAR LAMINECTOMY/DECOMPRESSION MICRODISCECTOMY 1 LEVEL;  Surgeon: Temple Pacini, MD;  Location: [facility];  Service: Neurosurgery;  Laterality: Right;  LUMBAR LAMINECTOMY/DECOMPRESSION MICRODISCECTOMY 1 LEVEL LUMBAR 2-3   ovaries removed  11/22/2001   PARTIAL HYSTERECTOMY  11/23/1979   SHOULDER ARTHROSCOPY WITH DISTAL CLAVICLE RESECTION Left 08/06/2022   Procedure: SHOULDER ARTHROSCOPY WITH DISTAL CLAVICLE RESECTION;  Surgeon: Yolonda Kida, MD;  Location: [facility];  Service: Orthopedics;  Laterality: Left;   SHOULDER ARTHROSCOPY WITH ROTATOR CUFF REPAIR Left 08/06/2022   Procedure: SHOULDER ARTHROSCOPY WITH ROTATOR CUFF REPAIR;  Surgeon: Yolonda Kida, MD;  Location: [facility] OR;  Service: Orthopedics;  Laterality: Left;   SHOULDER ARTHROSCOPY WITH SUBACROMIAL DECOMPRESSION Left 08/06/2022   Procedure: SHOULDER ARTHROSCOPY WITH SUBACROMIAL DECOMPRESSIONWITH EXTENSIVE DEBRIDEMENT;  Surgeon: Yolonda Kida, MD;  Location: [facility] OR;  Service: Orthopedics;  Laterality: Left;   SPHINCTEROTOMY     spinahatomy  11/22/1990   TOTAL KNEE ARTHROPLASTY Right 11/05/2020   Procedure: RIGHT TOTAL KNEE ARTHROPLASTY;  Surgeon: Eldred Manges, MD;  Location: [facility];  Service: Orthopedics;  Laterality: Right;   TUBAL LIGATION  11/22/1973   TUBAL LIGATION      Family History  Problem Relation Age of Onset   Cancer Other    Heart failure Other    Colon cancer Brother    Social History:  reports that she has never smoked. She has never been exposed to tobacco smoke. She has never used smokeless tobacco. She reports that she does not drink alcohol and does not use drugs.  Allergies:  Allergies  Allergen Reactions   Cinnamon Anaphylaxis    Denies allergy on 08/06/22, states "It was an interaction when mixed w/another med I no longer take"   Hydrocodone Other (See Comments)    Over sedation   Naproxen Anaphylaxis and Other (See Comments)    Throat swelling   Other  Shortness Of Breath and Other (See Comments)    Local anesthesia Had asthma attack  08/06/22 tolerated amide local anesthetic in peripheral nerve block.   Oxycodone Other (See Comments)    Over sedation   Feldene [Piroxicam] Nausea Only   Lyrica [Pregabalin] Other (See Comments)    Altered mental status for prolonged period   Nsaids     Avoid due to history of gastic bypass   Phenergan [Promethazine Hcl] Other (See Comments)    triggers asthma attacks   Sulfa Antibiotics Other (See Comments)    (Yeast infection, per patient.)   Latex Rash and Other (  See Comments)    "Blistery" rash.    Tape Rash and Other (See Comments)    Do not use adhesive tape; okay to use paper tape. 08/06/22 tolerated adhesive tape for peripheral IV    Medications Prior to Admission  Medication Sig Dispense Refill   albuterol (VENTOLIN HFA) 108 (90 Base) MCG/ACT inhaler Inhale 2 puffs into the lungs 4 (four) times daily as needed for shortness of breath.     atorvastatin (LIPITOR) 10 MG tablet Take 10 mg by mouth daily.     BREO ELLIPTA 200-25 MCG/INH AEPB Inhale 2 puffs into the lungs daily.     Carboxymethylcellulose Sodium (THERATEARS) 0.25 % SOLN Place 1 drop into both eyes in the morning, at noon, and at bedtime.     cyclobenzaprine (FLEXERIL) 5 MG tablet Take 1 tablet (5 mg total) by mouth every 8 (eight) hours as needed for muscle spasms. 20 tablet 0   denosumab (PROLIA) 60 MG/ML SOSY injection Inject 60 mg into the skin every 6 (six) months.     dicyclomine (BENTYL) 10 MG capsule Take 1 capsule (10 mg total) by mouth 3 (three) times daily as needed for spasms. 90 capsule 2   famotidine (PEPCID) 40 MG tablet Take 40 mg by mouth daily.     furosemide (LASIX) 20 MG tablet Take 20 mg by mouth 2 (two) times daily.     hydrOXYzine (ATARAX) 25 MG tablet Take by mouth.     levothyroxine (SYNTHROID) 112 MCG tablet Take 112 mcg by mouth daily before breakfast.     Multiple Vitamin (MULTIVITAMIN PO) Take 2  tablets by mouth daily. ADEK vitamin chewable     Olopatadine HCl (PATADAY OP) Place 1 drop into both eyes in the morning and at bedtime.     ondansetron (ZOFRAN) 4 MG tablet TAKE 1 TABLET BY MOUTH EVERY 8 HOURS AS NEEDED FOR NAUSEA OR VOMITING 30 tablet 1   oxyCODONE-acetaminophen (PERCOCET) 7.5-325 MG tablet Take 1 tablet by mouth every 4 (four) hours as needed for severe pain. 20 tablet 0   pantoprazole (PROTONIX) 40 MG tablet Take 40 mg by mouth daily.     polyethylene glycol (MIRALAX / GLYCOLAX) 17 g packet Take 17 g by mouth daily. 14 each 0   PRESCRIPTION MEDICATION Injection in both eyes every 10 weeks for macular deg     spironolactone (ALDACTONE) 25 MG tablet Take 25 mg by mouth daily.     Calcium Carb-Cholecalciferol (CALCIUM 500+D PO) Take 1 tablet by mouth in the morning and at bedtime.     Cholecalciferol (VITAMIN D) 50 MCG (2000 UT) tablet Take 2,000 Units by mouth daily.     Cyanocobalamin (VITAMIN B-12) 5000 MCG SUBL Place 5,000 mcg under the tongue every other day.     PROCTO-MED HC 2.5 % rectal cream PLACE 1 APPLICATION RECTALLY TWICE DAILY 28 g 1   Sod Picosulfate-Mag Ox-Cit Acd (CLENPIQ) 10-3.5-12 MG-GM -GM/175ML SOLN Take 1 kit by mouth as directed. 350 mL 0    No results found for this or any previous visit (from the past 48 hour(s)). No results found.  Review of Systems  All other systems reviewed and are negative.   Blood pressure 123/63, pulse 63, temperature 97.9 F (36.6 C), temperature source Oral, resp. rate 14, height 4' 11.75" (1.518 m), weight 61.2 kg, SpO2 100%. Physical Exam  GENERAL: The patient is AO x3, in no acute distress. HEENT: Head is normocephalic and atraumatic. EOMI are intact. Mouth is well hydrated and without  lesions. NECK: Supple. No masses LUNGS: Clear to auscultation. No presence of rhonchi/wheezing/rales. Adequate chest expansion HEART: RRR, normal s1 and s2. ABDOMEN: Soft, nontender, no guarding, no peritoneal signs, and  nondistended. BS +. No masses. EXTREMITIES: Without any cyanosis, clubbing, rash, lesions or edema. NEUROLOGIC: AOx3, no focal motor deficit. SKIN: no jaundice, no rashes  Assessment/Plan Kimberly [name] is a 67 y.o. female with past medical history of compensated NASH cirrhosis, CHF, coronary artery disease, depression, diabetes, COPD, IBS-D, hypothyroidism, hypertension, OSA, stroke, seizures, obesity status post duodenal switch, macular degeneration, comes for screening of esophageal varices and colorectal cancer, , family history of colon cancer.  Will proceed with EGD and colonoscopy.  Dolores Frame, MD 06/28/2023, 12:17 PM

## 2023-06-28 NOTE — Anesthesia Preprocedure Evaluation (Addendum)
Anesthesia Evaluation  Patient identified by MRN, date of birth, ID band Patient awake    Reviewed: Allergy & Precautions, H&P , NPO status , Patient's Chart, lab work & pertinent test results  History of Anesthesia Complications (+) PONV and history of anesthetic complications (asthma attack)  Airway Mallampati: II  TM Distance: >3 FB Neck ROM: Full    Dental  (+) Dental Advisory Given, Chipped   Pulmonary shortness of breath and with exertion, asthma , sleep apnea , pneumonia, COPD,  COPD inhaler   Pulmonary exam normal breath sounds clear to auscultation       Cardiovascular hypertension, Pt. on medications + CAD and +CHF  Normal cardiovascular exam+ Valvular Problems/Murmurs  Rhythm:Regular Rate:Normal     Neuro/Psych  Headaches, Seizures -,  PSYCHIATRIC DISORDERS  Depression     Neuromuscular disease CVA    GI/Hepatic hiatal hernia,GERD  Medicated and Controlled,,(+) Cirrhosis       , Hepatitis -  Endo/Other  diabetes (not on meds), Well Controlled, Type 2Hypothyroidism    Renal/GU negative Renal ROS  negative genitourinary   Musculoskeletal  (+) Arthritis , Osteoarthritis,  Fibromyalgia -  Abdominal   Peds negative pediatric ROS (+)  Hematology  (+) Blood dyscrasia, anemia   Anesthesia Other Findings   Reproductive/Obstetrics negative OB ROS                             Anesthesia Physical Anesthesia Plan  ASA: 3  Anesthesia Plan: General   Post-op Pain Management: Minimal or no pain anticipated   Induction: Intravenous  PONV Risk Score and Plan: 1 and Propofol infusion  Airway Management Planned: Nasal Cannula and Natural Airway  Additional Equipment:   Intra-op Plan:   Post-operative Plan:   Informed Consent: I have reviewed the patients History and Physical, chart, labs and discussed the procedure including the risks, benefits and alternatives for the proposed  anesthesia with the patient or authorized representative who has indicated his/her understanding and acceptance.     Dental advisory given  Plan Discussed with: CRNA and Surgeon  Anesthesia Plan Comments:         Anesthesia Quick Evaluation

## 2023-06-28 NOTE — Progress Notes (Signed)
Checked on pt, pt resting, pt c/o nausea. Dr.Battula notified. New order given for Zofran 4 mg IV. This was given to patient. Call bell with in reach.

## 2023-06-28 NOTE — Op Note (Signed)
[facility] Patient Name: Kimberly [name] Procedure Date: 06/28/2023 12:14 PM MRN: [id number] Date of Birth: Jun 22, 1956 Attending MD: Katrinka Blazing , , [id number] CSN: [id number] Age: 67 Admit Type: Outpatient Procedure:                Upper GI endoscopy Indications:              Cirrhosis rule out esophageal varices Providers:                Katrinka Blazing, Edrick Kins, RN, Elinor Parkinson, Dyann Ruddle Referring MD:              Medicines:                Monitored Anesthesia Care Complications:            No immediate complications. Estimated Blood Loss:     Estimated blood loss: none. Procedure:                Pre-Anesthesia Assessment:                           - Prior to the procedure, a History and Physical                            was performed, and patient medications, allergies                            and sensitivities were reviewed. The patient's                            tolerance of previous anesthesia was reviewed.                           - The risks and benefits of the procedure and the                            sedation options and risks were discussed with the                            patient. All questions were answered and informed                            consent was obtained.                           - ASA Grade Assessment: III - A patient with severe                            systemic disease.                           After obtaining informed consent, the endoscope was                            passed under direct vision. Throughout the  procedure, the patient's blood pressure, pulse, and                            oxygen saturations were monitored continuously. The                            [id number]-[id number] ([id number]) scope was introduced through the                            mouth, and advanced to the second part of duodenum.                            The upper GI endoscopy was  accomplished without                            difficulty. The patient tolerated the procedure                            well. Scope In: 1:03:32 PM Scope Out: 1:06:23 PM Total Procedure Duration: 0 hours 2 minutes 51 seconds  Findings:      The examined esophagus was normal.      Evidence of a duodenal switch and sleeve gastrectomy were found in the       gastric body. This was characterized by healthy appearing mucosa.      The examined duodenum was normal. Impression:               - Normal esophagus.                           - A duodenal switch and sleeve gastrectomy was                            found, characterized by healthy appearing mucosa.                           - Normal examined duodenum.                           - No specimens collected. Moderate Sedation:      Per Anesthesia Care Recommendation:           - Discharge patient to home (ambulatory).                           - Resume previous diet.                           - Repeat upper endoscopy in 3 years for screening                            purposes. Procedure Code(s):        --- Professional ---                           [id number], Esophagogastroduodenoscopy, flexible,  transoral; diagnostic, including collection of                            specimen(s) by brushing or washing, when performed                            (separate procedure) Diagnosis Code(s):        --- Professional ---                           Z30.86, Bariatric surgery status                           K74.60, Unspecified cirrhosis of liver CPT copyright 2022 American Medical Association. All rights reserved. The codes documented in this report are preliminary and upon coder review may  be revised to meet current compliance requirements. Katrinka Blazing, MD Katrinka Blazing,  06/28/2023 1:11:10 PM This report has been signed electronically. Number of Addenda: 0

## 2023-06-28 NOTE — Transfer of Care (Signed)
Immediate Anesthesia Transfer of Care Note  Patient: Kimberly [name]  Procedure(s) Performed: ESOPHAGOGASTRODUODENOSCOPY (EGD) WITH PROPOFOL COLONOSCOPY WITH PROPOFOL  Patient Location: Short Stay  Anesthesia Type:General  Level of Consciousness: drowsy and patient cooperative  Airway & Oxygen Therapy: Patient Spontanous Breathing and Patient connected to nasal cannula oxygen  Post-op Assessment: Report given to RN and Post -op Vital signs reviewed and stable  Post vital signs: Reviewed and stable  Last Vitals:  Vitals Value Taken Time  BP 125/52 06/28/23 1336  Temp 97.8 06/28/23   1336  Pulse 73 06/28/23 1338  Resp 21 06/28/23 1338  SpO2 100 % 06/28/23 1338  Vitals shown include unfiled device data.  Last Pain:  Vitals:   06/28/23 1300  TempSrc:   PainSc: 0-No pain         Complications: No notable events documented.

## 2023-06-29 ENCOUNTER — Encounter: Payer: Self-pay | Admitting: *Deleted

## 2023-06-29 ENCOUNTER — Ambulatory Visit: Payer: Self-pay | Admitting: *Deleted

## 2023-06-29 DIAGNOSIS — I5032 Chronic diastolic (congestive) heart failure: Secondary | ICD-10-CM

## 2023-06-29 DIAGNOSIS — Z5941 Food insecurity: Secondary | ICD-10-CM

## 2023-06-29 NOTE — Patient Outreach (Signed)
Care Coordination   Initial Visit Note   06/29/2023 Name: Kimberly [name] MRN: [id number] DOB: 07/22/1956  Kimberly [name] is a 67 y.o. year old female who sees Vyas, Angelina Pih, MD for primary care. I spoke with  [name] by phone today.  What matters to the patients health and wellness today?  Finding food resources    Goals Addressed             This Visit's Progress    Manage Heart Failure       Care Coordination Goals: Patient will take medications as directed Patient will keep all medical appointments Dr Sherril Croon on 08/22/23 Patient will schedule a follow-up with cardiologist Hasn't seen one in years. Would like Dr Diona Browner in [place]. RN Care Coordinator to message PCP with request for referral Patient will notify provider of any new or worsening symptoms Patient will check and record blood pressure daily and reach out to provider with any readings outside of recommended range Patient will check and record weight each morning after urinating and will call PCP with weight gain of >2 lbs overnight of >5 lbs in one week Patient will follow a low sodium heart healthy diet Patient will wear compression socks/hose for lower extremity edema and prop feet up when sitting Patient will call RN Care Coordinator [phone] with any resource or care coordination needs      Work with Care Management Team Regarding Food Resources       Care Coordination Goals: Patient will talk with CSW or Care Guides regarding need for food assistance.  She is disabled and lives at home with her husband who is retired Actor $23 per month in food stamps Does use [employer] when they have food available Eating one meal a day because of lack of food Patient will reach out to [employer] with any resource or care coordination needs [phone]         SDOH assessments and interventions completed:  Yes  SDOH Interventions Today    Flowsheet Row  Most Recent Value  SDOH Interventions   Food Insecurity Interventions AMB Referral  Transportation Interventions Intervention Not Indicated  Financial Strain Interventions Other (Comment)  [Amb Referral]        Care Coordination Interventions:  Yes, provided  Interventions Today    Flowsheet Row Most Recent Value  Chronic Disease   Chronic disease during today's visit Congestive Heart Failure (CHF), Other  [food insecurity]  General Interventions   General Interventions Discussed/Reviewed General Interventions Discussed, General Interventions Reviewed, Labs, Durable Medical Equipment (DME), Doctor Visits, Health Screening, [facility], [place] Exam, Communication with  [C/O swelling in feet/ankles/lower legs that does not resolve over night. Has not seen a cardiologist in years. Taking diuretics as prescribed. Also has leg cramps nightly. Hypoglycemia in the evening likely secondary to malnutrition from food insecurity.]  Labs Hgb A1c annually  Doctor Visits Discussed/Reviewed Doctor Visits Reviewed, Doctor Visits Discussed, Annual Wellness Visits, PCP, Specialist  [recent colonoscopy and endoscopy]  Health Screening Mammogram, Colonoscopy, Bone Density  Durable Medical Equipment (DME) Glucomoter, BP Cuff  [BP 120/60, Blood Sugar was 52 last night]  PCP/Specialist Visits Compliance with follow-up visit  [PCP scheduled for 08/22/23. Needs a referral to cardiologist for F/U on heart failure and lower extremity edema.]  Communication with PCP/Specialists, Social Work  [employer] Re: need for referral to cardiology to F/U on heart failure and lower extremity edema. CSW Re: food insecurity]  Exercise  Interventions   Exercise Discussed/Reviewed Physical Activity  Physical Activity Discussed/Reviewed Physical Activity Discussed, Physical Activity Reviewed  [able to perform ADLs independently. Exercise not encouraged at this time because I'm more concerned with possibility of malnutrition from  food insecurity and I don't want to burn calories that she can't afford to lose.]  Education Interventions   Education Provided Provided Education, Provided Printed Education  [Printed materials on high protein diet food options that may be affordable]  Provided Verbal Education On Nutrition, When to see the doctor, Mental Health/Coping with Illness, Medication, Blood Sugar Monitoring, Other, Eye Care, Labs  [Blood Pressure monitoring, Daily weights (check each morning after urinating and call provider with weight gain of more than 2 lbs overnight or more than 5 lbs in 1 wk), Monitor for swelling in feet/ankles/legs and abd]  Labs Reviewed Hgb A1c  [07/30/22 A1C 4.0%, 06/23/23 K+ 4.1 (WNL)]  Mental Health Interventions   Mental Health Discussed/Reviewed Mental Health Discussed, Mental Health Reviewed, Refer to Social Work for resources  Refer to Social Work for resources regarding Other  [Referral to SW or Care Guides for food insecurity]  Nutrition Interventions   Nutrition Discussed/Reviewed Nutrition Discussed, Nutrition Reviewed, Increasing proteins, Fluid intake, Adding fruits and vegetables, Supplemental nutrition  [Eating one meal per day d/t food insecurity. Supposed to be eating 6 small meals a day that are high in protein. h/o bariatric surgery 4 years ago]  Pharmacy Interventions   Pharmacy Dicussed/Reviewed Pharmacy Topics Discussed, Pharmacy Topics Reviewed, Medications and their functions  [Medications for DM not needed. Taking other meds regularly and as prescribed. Not prescribed potassium supplement even though she's on furosemide. Potassium is currently WNL.]  Safety Interventions   Safety Discussed/Reviewed Safety Discussed, Safety Reviewed, Fall Risk, Home Safety  Home Safety --  [No assistive devices. No recent falls. One fall last year that resulted in a broken arm, knee cap, and nose.]       Follow up plan: Follow up call scheduled for 07/13/23    Encounter Outcome:  Pt. Visit  Completed   Demetrios Loll, BSN, RN-BC RN Care Coordinator [facility]  [facility] Direct Dial: [phone] Main #: [phone]

## 2023-06-30 ENCOUNTER — Encounter (HOSPITAL_COMMUNITY): Payer: Self-pay | Admitting: Gastroenterology

## 2023-07-01 ENCOUNTER — Ambulatory Visit: Payer: Self-pay

## 2023-07-01 NOTE — Patient Instructions (Signed)
Visit Information  Thank you for taking time to visit with me today. Please don't hesitate to contact me if I can be of assistance to you.   Following are the goals we discussed today:   Goals Addressed             This Visit's Progress    Care Coordination Activities       Care Coordination Interventions: Food-Patient is currently receiving Foodstamps and is connected with community resources extra assistance.  Patients has discovered that very often food is expired.  SW will forward a list of additional options in her area for food.  Patient declines soup kitchen options for hot meals.  Heating assistance-SW provides information on DSS resource for energy assistance.  Patient is encouraged to apply for the LIEAP to get help together her heating source in Feb/March.  Applications start in Nov/Dec and families who receive foodstamps are generally eligible.           Please call the care guide team at [phone] if you need to cancel or reschedule your appointment.   If you are experiencing a Mental Health or Behavioral Health Crisis or need someone to talk to, please call 911  Patient verbalizes understanding of instructions and care plan provided today and agrees to view in MyChart. Active MyChart status and patient understanding of how to access instructions and care plan via MyChart confirmed with patient.     No further follow up required:    Lysle Morales, BSW Social Worker [facility] Care Management  [phone]

## 2023-07-01 NOTE — Patient Outreach (Signed)
  Care Coordination   Initial Visit Note   07/01/2023 Name: Kimberly [name] MRN: [id number] DOB: 1956-09-14  Kimberly [name] is a 67 y.o. year old female who sees Vyas, Angelina Pih, MD for primary care. I spoke with  Kimberly [name] by phone today.  What matters to the patients health and wellness today?  Patient is interested in food assistance.    Goals Addressed             This Visit's Progress    Care Coordination Activities       Care Coordination Interventions: Food-Patient is currently receiving Foodstamps and is connected with community resources extra assistance.  Patients has discovered that very often food is expired.  SW will forward a list of additional options in her area for food.  Patient declines soup kitchen options for hot meals.  Heating assistance-SW provides information on DSS resource for energy assistance.  Patient is encouraged to apply for the LIEAP to get help together her heating source in Feb/March.  Applications start in Nov/Dec and families who receive foodstamps are generally eligible.           SDOH assessments and interventions completed:  Yes  SDOH Interventions Today    Flowsheet Row Most Recent Value  SDOH Interventions   Food Insecurity Interventions Other (Comment)  [Patient uses community resources and Foodstamps]  Housing Interventions Intervention Not Indicated  Transportation Interventions Intervention Not Indicated  Utilities Interventions Intervention Not Indicated        Care Coordination Interventions:  Yes, provided  Interventions Today    Flowsheet Row Most Recent Value  General Interventions   General Interventions Discussed/Reviewed General Interventions Discussed, Community Resources  [Food bank list, decline soup kitchen hot meal options, LIEAP program at [employer] for energy assistance.]        Follow up plan: No further intervention required.   Encounter Outcome:  Pt. Visit Completed

## 2023-07-04 DIAGNOSIS — H35371 Puckering of macula, right eye: Secondary | ICD-10-CM | POA: Diagnosis not present

## 2023-07-04 DIAGNOSIS — H353231 Exudative age-related macular degeneration, bilateral, with active choroidal neovascularization: Secondary | ICD-10-CM | POA: Diagnosis not present

## 2023-07-04 DIAGNOSIS — H35033 Hypertensive retinopathy, bilateral: Secondary | ICD-10-CM | POA: Diagnosis not present

## 2023-07-04 DIAGNOSIS — H43823 Vitreomacular adhesion, bilateral: Secondary | ICD-10-CM | POA: Diagnosis not present

## 2023-07-04 DIAGNOSIS — H5319 Other subjective visual disturbances: Secondary | ICD-10-CM | POA: Diagnosis not present

## 2023-07-04 DIAGNOSIS — Z961 Presence of intraocular lens: Secondary | ICD-10-CM | POA: Diagnosis not present

## 2023-07-04 DIAGNOSIS — E113293 Type 2 diabetes mellitus with mild nonproliferative diabetic retinopathy without macular edema, bilateral: Secondary | ICD-10-CM | POA: Diagnosis not present

## 2023-07-05 ENCOUNTER — Other Ambulatory Visit: Payer: Self-pay | Admitting: Internal Medicine

## 2023-07-05 DIAGNOSIS — Z1231 Encounter for screening mammogram for malignant neoplasm of breast: Secondary | ICD-10-CM

## 2023-07-13 ENCOUNTER — Encounter: Payer: Self-pay | Admitting: *Deleted

## 2023-07-13 ENCOUNTER — Telehealth: Payer: Self-pay | Admitting: *Deleted

## 2023-07-13 NOTE — Progress Notes (Signed)
  Care Coordination Note  07/13/2023 Name: Kimberly [name] MRN: [id number] DOB: 04-26-1956  Kimberly [name] is a 67 y.o. year old female who is a primary care patient of Sherril Croon, Dhruv B, MD and is actively engaged with the care management team. I reached out to [name] by phone today to assist with re-scheduling an initial visit with the RN Case Manager  Follow up plan: Unsuccessful telephone outreach attempt made. A HIPAA compliant phone message was left for the patient providing contact information and requesting a return call.   [facility]  Care Coordination Care Guide  Direct Dial: [phone]

## 2023-07-13 NOTE — Progress Notes (Signed)
  Care Coordination Note  07/13/2023 Name: ANADIA KURDI MRN: [id number] DOB: 1956/04/29  BRYNNAN MAZYCK is a 67 y.o. year old female who is a primary care patient of Vyas, Dhruv B, MD and is actively engaged with the care management team. I reached out to [name] by phone today to assist with re-scheduling an initial visit with the RN Case Manager  Follow up plan: Telephone appointment with care management team member scheduled for:07/20/23  [facility] Coordination Care Guide  Direct Dial: [phone]

## 2023-07-16 DIAGNOSIS — S82092S Other fracture of left patella, sequela: Secondary | ICD-10-CM | POA: Diagnosis not present

## 2023-07-16 DIAGNOSIS — J449 Chronic obstructive pulmonary disease, unspecified: Secondary | ICD-10-CM | POA: Diagnosis not present

## 2023-07-18 ENCOUNTER — Ambulatory Visit (INDEPENDENT_AMBULATORY_CARE_PROVIDER_SITE_OTHER): Payer: 59 | Admitting: Gastroenterology

## 2023-07-18 ENCOUNTER — Encounter (INDEPENDENT_AMBULATORY_CARE_PROVIDER_SITE_OTHER): Payer: Self-pay | Admitting: Gastroenterology

## 2023-07-18 VITALS — BP 115/77 | HR 63 | Temp 97.8°F | Ht 59.75 in | Wt 130.5 lb

## 2023-07-18 DIAGNOSIS — R1011 Right upper quadrant pain: Secondary | ICD-10-CM

## 2023-07-18 DIAGNOSIS — K7460 Unspecified cirrhosis of liver: Secondary | ICD-10-CM

## 2023-07-18 DIAGNOSIS — K219 Gastro-esophageal reflux disease without esophagitis: Secondary | ICD-10-CM

## 2023-07-18 DIAGNOSIS — K7581 Nonalcoholic steatohepatitis (NASH): Secondary | ICD-10-CM

## 2023-07-18 DIAGNOSIS — K580 Irritable bowel syndrome with diarrhea: Secondary | ICD-10-CM | POA: Diagnosis not present

## 2023-07-18 MED ORDER — OMEPRAZOLE 40 MG PO CPDR: 40.0000 mg | DELAYED_RELEASE_CAPSULE | Freq: Every day | ORAL | 3 refills | Status: DC

## 2023-07-18 MED ORDER — RIFAXIMIN 550 MG PO TABS: 550.0000 mg | ORAL_TABLET | Freq: Three times a day (TID) | ORAL | 0 refills | Status: DC

## 2023-07-18 NOTE — Progress Notes (Unsigned)
Referring Provider: Ignatius Specking, MD Primary Care Physician:  Ignatius Specking, MD Primary GI Physician: Dr. Levon Hedger   Chief Complaint  Patient presents with   Cirrhosis    Follow up on cirrhosis. Has some swelling in ankles and feeling like she holding fluid at times. Feels heavy in chest on right side.    Gastroesophageal Reflux    Follow up on GERD. States symptoms are bad. Takes protonix daily and pepcid twice daily and taking tums. Having a lot of nausea.    Irritable Bowel Syndrome    Follow up on IBs. Goes back and forth between diarrhea and constipation.    HPI:   Kimberly [name] is a 67 y.o. female with past medical history of  compensated NASH cirrhosis, CHF, coronary artery disease, depression, diabetes, COPD, IBS-D, hypothyroidism, hypertension, OSA, stroke, seizures, obesity status post duodenal switch, macular degeneration   Patient presenting today for follow up of cirrhosis, GERD and IBS  Hospitalization in march for heme positive stool and anemia, hgb 10.5 on admission, no rectal bleeding or melena, noted she swallowed some blood during a fall prior to presenting to the ED having intermittent diarrhea as well. Some swelling to LEs, intermittent nausea/vomiting. No workup done as heme positive stool thought secondary to swallowed blood from fall/trauma.   At last ov in feb, having 8-10 loose stools per day, some nausea. GERD controlled on protonix and famotidine, celiac panel ordered and cholestyramine recommended, dicyclomine given for abdominal pain and RUQ [place] ordered.  Iron studies during admission in march were WNL   Last AFP in feb 2024 was 3  Present: Patient states she is feeling some better since her fall in march. She suffered multiple broken bones at that time.   She reports for the past few weeks she has had more acid regurgitation all throughout the day and some waking her up at night. She is on protonix 40mg  daily, has increased her famotidine 40mg  to  BID but is still having issues despite increasing her famotidine  She is taking dicyclomine with each meal due to abdominal cramping. This has helped some with her cramping. She is on a limited income so she is only doing 2 meals per day. She has tried to determine specific triggers but has not been able to figure out much she can tolerate. She has abdominal pain almost everytime she eats but has noticed if she takes dicyclomine with her meal this seems to be less severe. She is down to 130 lbs, was 134 in march at last OV.  She is still having some looser stools, though also notes some tenesmus at times though stools are not hard and she usually has #6 on bristol stool scale. No rectal bleeding or melena. She is not taking anything currently for loose stools, imodium in the past caused constipation. Having on average 5-6 BMs per day, she gets up 3-4 times per night to urinate and will pass a small amount of stool at that time. She has some fecal incontinence at times as well. She saw her bariatric surgeon who had recommended a revision of her previous duodenal switch though had been advised by Dr. Levon Hedger given her cirrhosis, we would not recommend this.   She notes some Right abdominal pain for the past few days, usually more of a cramping sensation, improves with lying down. She has ongoing nausea but no vomiting. She is using zofran PRN for nausea.  She notes some bloating at times and some  heaviness in her upper abdomen, denies abdomen becoming tight or hard. She is able to pass gas at times which helps these symptoms. No episodes of confusion.    Previous MELD 3.0 (labs from July/August) 11 Cirrhosis related questions: Episodes of confusion/disorientation: none  Taking diuretics? Lasix 40, spironolactone 25mg  daily  Beta blockers?  No  Prior history of variceal banding? No (none on recent EGD 06/2023) Prior episodes of SBP? No  Last liver imaging: 01/2023 cirrhosis, no lesions  Alcohol use:  no    Last Colonoscopy:06/28/23  - The examined portion of the ileum was normal.                           - The entire examined colon is normal.                           - The distal rectum and anal verge are normal on                            retroflexion view.                           - No specimens collected.  Last Endoscopy:06/28/23  - Normal esophagus.                           - A duodenal switch and sleeve gastrectomy was                            found, characterized by healthy appearing mucosa.                           - Normal examined duodenum.                           - No specimens collected.  Recommendations:  Repeat EGD in 3 years, TCS in 5 years   Past Medical History:  Diagnosis Date   Anemia    PMH: as a child   Arthritis    Asthma    CAD (coronary artery disease)    nonobstructive by cath, 6/08 (false positive Cardiolite) normal stress echo, 7/11   CHF (congestive heart failure) (HCC)    Chronic back pain    Chronic bronchitis (HCC)    Cirrhosis (HCC)    Complication of anesthesia    had an asthma attack when woke up from procedure   Complication of anesthesia    asthma attack with one surgery   COPD (chronic obstructive pulmonary disease) (HCC)    Degenerative joint disease    Depression    DJD (degenerative joint disease)    Fatty liver    Fibromyalgia    GERD (gastroesophageal reflux disease)    Glaucoma    Headache(784.0)    Heart failure, diastolic, chronic (HCC)    Heart murmur    PMH:As a child only   Hernia of abdominal cavity    "upper and lower hernia"   History of hiatal hernia    HTN (hypertension)    Hypertension    Hypothyroidism    IBS (irritable bowel syndrome)    Morbid obesity (HCC)    Neuropathy    associated with  diabetes   Obstructive sleep apnea    Pancreatitis    Pinched nerve in neck    Pneumonia    as a child   PONV (postoperative nausea and vomiting)    Pulmonary hypertension (HCC)    Rheumatic fever     PMH: as a child   Seizures (HCC)    PMH: only as a child   Seizures (HCC)    in childhood   Shortness of breath    Sleep apnea    Spinal stenosis    Stroke (HCC)    12/09/2013    Past Surgical History:  Procedure Laterality Date   ABDOMINAL HYSTERECTOMY     ABDOMINAL HYSTERECTOMY     partial hysterectomy   APPENDECTOMY     BACK SURGERY     BACK SURGERY     disc surgery with complications and damage to right side   BARIATRIC SURGERY     DS in June 2020   BILATERAL KNEE ARTHROSCOPY  2000, 2009   BIOPSY  06/16/2021   Procedure: BIOPSY;  Surgeon: Dolores Frame, MD;  [facility]: [facility];  Service: Gastroenterology;;   BREAST CYST EXCISION Left    CARDIAC CATHETERIZATION     2009   CATARACT EXTRACTION W/ INTRAOCULAR LENS  IMPLANT, BILATERAL     CHOLECYSTECTOMY  11/22/1992   CHOLECYSTECTOMY     COLONOSCOPY N/A 08/01/2013   Procedure: COLONOSCOPY;  Surgeon: Malissa Hippo, MD;  [facility]: [facility];  Service: Endoscopy;  Laterality: N/A;  930   COLONOSCOPY N/A 08/03/2017   Procedure: COLONOSCOPY;  Surgeon: Malissa Hippo, MD;  [facility]: [facility];  Service: Endoscopy;  Laterality: N/A;  12:00   COLONOSCOPY WITH PROPOFOL N/A 06/28/2023   Procedure: COLONOSCOPY WITH PROPOFOL;  Surgeon: Dolores Frame, MD;  [facility]: [facility];  Service: Gastroenterology;  Laterality: N/A;  12:30pm;asa 3   CYST EXCISION     Left breast   ESOPHAGOGASTRODUODENOSCOPY (EGD) WITH PROPOFOL N/A 06/16/2021   Procedure: ESOPHAGOGASTRODUODENOSCOPY (EGD) WITH PROPOFOL;  Surgeon: Dolores Frame, MD;  [facility];  Service: Gastroenterology;  Laterality: N/A;  12:50   ESOPHAGOGASTRODUODENOSCOPY (EGD) WITH PROPOFOL N/A 06/28/2023   Procedure: ESOPHAGOGASTRODUODENOSCOPY (EGD) WITH PROPOFOL;  Surgeon: Dolores Frame, MD;  [facility];  Service: Gastroenterology;  Laterality: N/A;  12:30pm;asa 3   EXCISIONAL HEMORRHOIDECTOMY     EYE  SURGERY  11/22/1965   EYE SURGERY     bilateral cataract removal with lens implants   EYE SURGERY     growth removed from right eye lid   EYE SURGERY     bilateral eye surgery age 24 t"to straighten eyes'   heel (other)  11/23/2003   HEEL SPUR SURGERY     Right heel - growth removal and rebuilding of heel   HEMORRHOID SURGERY     HERNIA REPAIR     KNEE ARTHROSCOPY Bilateral    LUMBAR LAMINECTOMY/DECOMPRESSION MICRODISCECTOMY Right 08/23/2014   Procedure: LUMBAR LAMINECTOMY/DECOMPRESSION MICRODISCECTOMY 1 LEVEL;  Surgeon: Temple Pacini, MD;  [facility]: [facility];  Service: Neurosurgery;  Laterality: Right;  LUMBAR LAMINECTOMY/DECOMPRESSION MICRODISCECTOMY 1 LEVEL LUMBAR 2-3   ovaries removed  11/22/2001   PARTIAL HYSTERECTOMY  11/23/1979   SHOULDER ARTHROSCOPY WITH DISTAL CLAVICLE RESECTION Left 08/06/2022   Procedure: SHOULDER ARTHROSCOPY WITH DISTAL CLAVICLE RESECTION;  Surgeon: Yolonda Kida, MD;  Location: [facility] OR;  Service: Orthopedics;  Laterality: Left;   SHOULDER ARTHROSCOPY WITH ROTATOR CUFF REPAIR Left 08/06/2022   Procedure:  SHOULDER ARTHROSCOPY WITH ROTATOR CUFF REPAIR;  Surgeon: Yolonda Kida, MD;  Location: [facility];  Service: Orthopedics;  Laterality: Left;   SHOULDER ARTHROSCOPY WITH SUBACROMIAL DECOMPRESSION Left 08/06/2022   Procedure: SHOULDER ARTHROSCOPY WITH SUBACROMIAL DECOMPRESSIONWITH EXTENSIVE DEBRIDEMENT;  Surgeon: Yolonda Kida, MD;  Location: [facility] OR;  Service: Orthopedics;  Laterality: Left;   SPHINCTEROTOMY     spinahatomy  11/22/1990   TOTAL KNEE ARTHROPLASTY Right 11/05/2020   Procedure: RIGHT TOTAL KNEE ARTHROPLASTY;  Surgeon: Eldred Manges, MD;  Location: [facility];  Service: Orthopedics;  Laterality: Right;   TUBAL LIGATION  11/22/1973   TUBAL LIGATION      Current Outpatient Medications  Medication Sig Dispense Refill   albuterol (VENTOLIN HFA) 108 (90 Base) MCG/ACT inhaler Inhale 2 puffs into the lungs 4 (four) times daily as needed  for shortness of breath.     atorvastatin (LIPITOR) 10 MG tablet Take 10 mg by mouth daily.     BREO ELLIPTA 200-25 MCG/INH AEPB Inhale 2 puffs into the lungs daily.     Calcium Carb-Cholecalciferol (CALCIUM 500+D PO) Take 1 tablet by mouth in the morning and at bedtime.     Carboxymethylcellulose Sodium (THERATEARS) 0.25 % SOLN Place 1 drop into both eyes in the morning, at noon, and at bedtime.     Cholecalciferol (VITAMIN D) 50 MCG (2000 UT) tablet Take 2,000 Units by mouth daily.     cyclobenzaprine (FLEXERIL) 5 MG tablet Take 1 tablet (5 mg total) by mouth every 8 (eight) hours as needed for muscle spasms. 20 tablet 0   denosumab (PROLIA) 60 MG/ML SOSY injection Inject 60 mg into the skin every 6 (six) months.     dicyclomine (BENTYL) 10 MG capsule Take 1 capsule (10 mg total) by mouth 3 (three) times daily as needed for spasms. 90 capsule 2   famotidine (PEPCID) 40 MG tablet Take 40 mg by mouth daily.     furosemide (LASIX) 20 MG tablet Take 20 mg by mouth 2 (two) times daily.     levothyroxine (SYNTHROID) 112 MCG tablet Take 112 mcg by mouth daily before breakfast.     Multiple Vitamin (MULTIVITAMIN PO) Take 2 tablets by mouth daily. ADEK vitamin chewable     Olopatadine HCl (PATADAY OP) Place 1 drop into both eyes in the morning and at bedtime.     ondansetron (ZOFRAN) 4 MG tablet TAKE 1 TABLET BY MOUTH EVERY 8 HOURS AS NEEDED FOR NAUSEA OR VOMITING 30 tablet 1   pantoprazole (PROTONIX) 40 MG tablet Take 40 mg by mouth daily.     PRESCRIPTION MEDICATION Injection in both eyes every 8 weeks for macular deg     PROCTO-MED HC 2.5 % rectal cream PLACE 1 APPLICATION RECTALLY TWICE DAILY 28 g 1   spironolactone (ALDACTONE) 25 MG tablet Take 25 mg by mouth daily.     No current facility-administered medications for this visit.    Allergies as of 07/18/2023 - Review Complete 07/18/2023  Allergen Reaction Noted   Cinnamon Anaphylaxis 09/29/2016   Hydrocodone Other (See Comments) 09/06/2014    Naproxen Anaphylaxis and Other (See Comments)    Other Shortness Of Breath and Other (See Comments) 08/20/2011   Oxycodone Other (See Comments) 09/06/2014   Feldene [piroxicam] Nausea Only 09/28/2016   Lyrica [pregabalin] Other (See Comments) 09/28/2016   Nsaids  10/22/2020   Phenergan [promethazine hcl] Other (See Comments) 08/04/2016   Sulfa antibiotics Other (See Comments) 04/06/2017   Latex Rash and Other (See Comments) 04/06/2017  Tape Rash and Other (See Comments) 08/14/2014    Family History  Problem Relation Age of Onset   Cancer Other    Heart failure Other    Colon cancer Brother     Social History   Socioeconomic History   Marital status: Married    Spouse name: Not on file   Number of children: Not on file   Years of education: Not on file   Highest education level: Not on file  Occupational History   Not on file  Tobacco Use   Smoking status: Never    Passive exposure: Never   Smokeless tobacco: Never  Vaping Use   Vaping status: Never Used  Substance and Sexual Activity   Alcohol use: No   Drug use: No   Sexual activity: Not on file  Other Topics Concern   Not on file  Social History Narrative   ** Merged History Encounter **       Regularly exercises. Full Time.    Social Determinants of Health   Financial Resource Strain: Low Risk  (06/29/2023)   Overall Financial Resource Strain (CARDIA)    Difficulty of Paying Living Expenses: Not very hard  Recent Concern: Financial Resource Strain - Medium Risk (06/29/2023)   Overall Financial Resource Strain (CARDIA)    Difficulty of Paying Living Expenses: Somewhat hard  Food Insecurity: Food Insecurity Present (07/01/2023)   Hunger Vital Sign    Worried About Running Out of Food in the Last Year: Sometimes true    Ran Out of Food in the Last Year: Sometimes true  Transportation Needs: No Transportation Needs (07/01/2023)   PRAPARE - Administrator, Civil Service (Medical): No    Lack of  Transportation (Non-Medical): No  Physical Activity: Not on file  Stress: Not on file  Social Connections: Not on file    Review of systems General: negative for malaise, night sweats, fever, chills, weight los Neck: Negative for lumps, goiter, pain and significant neck swelling Resp: Negative for cough, wheezing, dyspnea at rest CV: Negative for chest pain, leg swelling, palpitations, orthopnea GI: denies melena, hematochezia, nausea, vomiting, diarrhea, constipation, dysphagia, odyonophagia, early satiety or unintentional weight loss.  MSK: Negative for joint pain or swelling, back pain, and muscle pain. Derm: Negative for itching or rash Psych: Denies depression, anxiety, memory loss, confusion. No homicidal or suicidal ideation.  Heme: Negative for prolonged bleeding, bruising easily, and swollen nodes. Endocrine: Negative for cold or heat intolerance, polyuria, polydipsia and goiter. Neuro: negative for tremor, gait imbalance, syncope and seizures. The remainder of the review of systems is noncontributory.  Physical Exam: BP 115/77 (BP Location: Left Arm, Patient Position: Sitting, Cuff Size: Normal)   Pulse 63   Temp 97.8 F (36.6 C) (Oral)   Ht 4' 11.75" (1.518 m)   Wt 130 lb 8 oz (59.2 kg)   BMI 25.70 kg/m  General:   Alert and oriented. No distress noted. Pleasant and cooperative.  Head:  Normocephalic and atraumatic. Eyes:  Conjuctiva clear without scleral icterus. Mouth:  Oral mucosa pink and moist. Good dentition. No lesions. Heart: Normal rate and rhythm, s1 and s2 heart sounds present.  Lungs: Clear lung sounds in all lobes. Respirations equal and unlabored. Abdomen:  +BS, soft, non-tender and non-distended. No rebound or guarding. No HSM or masses noted. Derm: No palmar erythema or jaundice Msk:  Symmetrical without gross deformities. Normal posture. Extremities:  Without edema. Neurologic:  Alert and  oriented x4 Psych:  Alert and cooperative.  Normal mood and  affect.  Invalid input(s): "6 MONTHS"   ASSESSMENT: KYARI SHINDLER is a 67 y.o. female presenting today    PLAN:  RUQ [place]  2. AFP  3. Repeat MELD labs January  4. Stop protonix 5. Start omeprazole 40mg  daily 6. Continue with famotidine 40mg  at bedtime 7. Xifaxan 550mg  TID x2 weeks 8. Continue with lasix 40mg  daily, spironolactone 25mg  daily 9. Continue with zofran 4mg    All questions were answered, patient verbalized understanding and is in agreement with plan as outlined above.    Follow Up: 6 months   Austynn Pridmore L. Jeanmarie Hubert, MSN, APRN, AGNP-C Adult-Gerontology Nurse Practitioner [facility]

## 2023-07-18 NOTE — Patient Instructions (Signed)
I have provided info on vitamin d deficiency, please continue to follow with PCP regarding this  We will update tumor marker and [place] for your liver  Stop protonix and start omeprazole 40mg  daily, continue with famotidine 40mg  in the evening, if acid reflux is not improved, please let me know  I have sent xifaxan 550mg  to take three times per day for 14 days to see if this helps with diarrhea, please let me know if out of pocket cost is too much, we do not have samples right now but we may have some in the next few weeks  - Reduce salt intake to <2 g per day - Can take Tylenol max of 2 g per day (650 mg q8h) for pain - Avoid NSAIDs for pain - Avoid eating raw oysters/shellfish - Ensure every night before going to sleep   Follow up 6 months  Sandra Brents L. Jeanmarie Hubert, MSN, APRN, AGNP-C Adult-Gerontology Nurse Practitioner [facility]

## 2023-07-19 ENCOUNTER — Telehealth (INDEPENDENT_AMBULATORY_CARE_PROVIDER_SITE_OTHER): Payer: Self-pay | Admitting: *Deleted

## 2023-07-19 DIAGNOSIS — R1011 Right upper quadrant pain: Secondary | ICD-10-CM | POA: Insufficient documentation

## 2023-07-19 LAB — AFP TUMOR MARKER: AFP-Tumor Marker: 2 ng/mL

## 2023-07-19 NOTE — Telephone Encounter (Signed)
PA completed through cover my meds. Xifaxan 550mg  was approved. Pt notified and vm left at [facility] to notify pharmacist med is approved. Pt is aware it sometimes has a high co pay and to let us know if unable to get med.

## 2023-07-20 ENCOUNTER — Ambulatory Visit: Payer: Self-pay | Admitting: *Deleted

## 2023-07-20 NOTE — Patient Outreach (Signed)
  Care Coordination   07/20/2023 Name: Kimberly [name] MRN: [id number] DOB: [birth year]   Care Coordination Outreach Attempts:  An unsuccessful telephone outreach was attempted for a scheduled appointment today.  Follow Up Plan:  Additional outreach attempts will be made to offer the patient care coordination information and services.   Encounter Outcome:  No Answer. Let HIPAA compliant VM.   Care Coordination Interventions:  No, not indicated    Demetrios Loll, RN, BSN RN Care Coordinator [facility]  [facility] Direct Dial: [phone] Main #: [phone]

## 2023-07-25 DIAGNOSIS — J449 Chronic obstructive pulmonary disease, unspecified: Secondary | ICD-10-CM | POA: Diagnosis not present

## 2023-07-27 ENCOUNTER — Other Ambulatory Visit (INDEPENDENT_AMBULATORY_CARE_PROVIDER_SITE_OTHER): Payer: Self-pay | Admitting: Gastroenterology

## 2023-07-27 NOTE — Telephone Encounter (Signed)
Last seen 07/18/23

## 2023-07-29 ENCOUNTER — Ambulatory Visit (HOSPITAL_COMMUNITY)
Admission: RE | Admit: 2023-07-29 | Discharge: 2023-07-29 | Disposition: A | Discharge status: Home / Self Care | Payer: 59 | Source: Ambulatory Visit | Attending: Gastroenterology | Admitting: Gastroenterology

## 2023-07-29 DIAGNOSIS — K7689 Other specified diseases of liver: Secondary | ICD-10-CM | POA: Diagnosis not present

## 2023-07-29 DIAGNOSIS — K7581 Nonalcoholic steatohepatitis (NASH): Secondary | ICD-10-CM | POA: Insufficient documentation

## 2023-07-29 DIAGNOSIS — K7460 Unspecified cirrhosis of liver: Secondary | ICD-10-CM | POA: Insufficient documentation

## 2023-07-29 DIAGNOSIS — Z9049 Acquired absence of other specified parts of digestive tract: Secondary | ICD-10-CM | POA: Diagnosis not present

## 2023-08-02 ENCOUNTER — Inpatient Hospital Stay: Admission: RE | Admit: 2023-08-02 | Payer: 59 | Source: Ambulatory Visit

## 2023-08-09 ENCOUNTER — Ambulatory Visit
Admission: RE | Admit: 2023-08-09 | Discharge: 2023-08-09 | Disposition: A | Discharge status: Home / Self Care | Payer: 59 | Source: Ambulatory Visit | Attending: Internal Medicine | Admitting: Internal Medicine

## 2023-08-09 DIAGNOSIS — Z1231 Encounter for screening mammogram for malignant neoplasm of breast: Secondary | ICD-10-CM | POA: Diagnosis not present

## 2023-08-11 ENCOUNTER — Other Ambulatory Visit: Payer: Self-pay | Admitting: Internal Medicine

## 2023-08-11 DIAGNOSIS — R928 Other abnormal and inconclusive findings on diagnostic imaging of breast: Secondary | ICD-10-CM

## 2023-08-16 DIAGNOSIS — S82092S Other fracture of left patella, sequela: Secondary | ICD-10-CM | POA: Diagnosis not present

## 2023-08-16 DIAGNOSIS — J449 Chronic obstructive pulmonary disease, unspecified: Secondary | ICD-10-CM | POA: Diagnosis not present

## 2023-08-18 ENCOUNTER — Ambulatory Visit: Payer: 59 | Admitting: Neurology

## 2023-08-22 DIAGNOSIS — Z299 Encounter for prophylactic measures, unspecified: Secondary | ICD-10-CM | POA: Diagnosis not present

## 2023-08-22 DIAGNOSIS — K7460 Unspecified cirrhosis of liver: Secondary | ICD-10-CM | POA: Diagnosis not present

## 2023-08-22 DIAGNOSIS — Z23 Encounter for immunization: Secondary | ICD-10-CM | POA: Diagnosis not present

## 2023-08-22 DIAGNOSIS — M545 Low back pain, unspecified: Secondary | ICD-10-CM | POA: Diagnosis not present

## 2023-08-22 DIAGNOSIS — I10 Essential (primary) hypertension: Secondary | ICD-10-CM | POA: Diagnosis not present

## 2023-08-22 DIAGNOSIS — E039 Hypothyroidism, unspecified: Secondary | ICD-10-CM | POA: Diagnosis not present

## 2023-08-24 DIAGNOSIS — J449 Chronic obstructive pulmonary disease, unspecified: Secondary | ICD-10-CM | POA: Diagnosis not present

## 2023-08-30 ENCOUNTER — Encounter: Payer: Self-pay | Admitting: *Deleted

## 2023-08-30 ENCOUNTER — Ambulatory Visit: Payer: Self-pay | Admitting: *Deleted

## 2023-08-31 NOTE — Patient Outreach (Signed)
Care Coordination   Follow Up Visit Note   08/30/2023 Name: Kimberly [name] MRN: [id number] DOB: [birth year]  Kimberly [name] is a 67 y.o. year old female who sees Vyas, Kimberly Pih, MD for primary care. I spoke with  Kimberly [name] by phone today.  What matters to the patients health and wellness today?  Food insecurity    Goals Addressed             This Visit's Progress    Manage Heart Failure   On track    Care Coordination Goals: Patient will take medications as directed Patient will keep all medical appointments Patient will work with PCP to schedule a follow-up with cardiologist Patient will notify provider of any new or worsening symptoms Patient will check and record blood pressure daily and reach out to provider with any readings outside of recommended range Patient will check and record weight each morning after urinating and will call PCP with weight gain of >2 lbs overnight of >5 lbs in one week Patient will follow a low sodium heart healthy diet Patient will continue to wear compression socks/hose for lower extremity edema and prop feet up when sitting Patient will call RN Care Coordinator [phone] with any resource or care coordination needs      Work with Care Management Team Regarding Food Resources   On track    Care Coordination Goals: Patient will talk with CSW again regarding food assistance needs She is disabled and lives at home with her husband who is retired Actor $23 per month in food stamps and $326 on [place]. Her husband does not have a [employer]. Does use [employer] when they have food available Eating 1 to 2 meals a day because of lack of food Patient will review handout on [employer]. They have a location near her in [place] and provide a warm lunch Monday through Thursday.  Patient will reach out to RN Care Coordinator with any resource or care coordination needs [phone]         SDOH  assessments and interventions completed:  Yes  SDOH Interventions Today    Flowsheet Row Most Recent Value  SDOH Interventions   Food Insecurity Interventions Other (Comment)  [uses community resources, food stamps $23, and [employer] $326. Still needs additional assistance. Will connect with Social Worker.]  Housing Interventions Intervention Not Indicated  Transportation Interventions Intervention Not Indicated  Utilities Interventions Intervention Not Indicated  Social Connections Interventions [facility] [employer]  Health Literacy Interventions Intervention Not Indicated        Care Coordination Interventions:  Yes, provided  Interventions Today    Flowsheet Row Most Recent Value  Chronic Disease   Chronic disease during today's visit Congestive Heart Failure (CHF)  General Interventions   General Interventions Discussed/Reviewed General Interventions Discussed, General Interventions Reviewed, Labs, Durable Medical Equipment (DME), Communication with  Durable Medical Equipment (DME) BP Cuff, Other  [Scales.Reports that blood pressure is normal and weight is stable but no readings to report. Encouraged to have those for next telephone visit. Wearing compression stockings.]  Communication with Social Work  [employer], BSW regarding patient's ongoing need for food assistance. Has been outreached in the recent past.]  Exercise Interventions   Exercise Discussed/Reviewed Exercise Discussed, Exercise Reviewed, Physical Activity  Physical Activity Discussed/Reviewed Physical Activity Discussed, Physical Activity Reviewed  [due to food insecurity and limited food intake, I have not encouraged to increase acitivty level at this time.]  Education  Interventions   Education Provided Provided Education  Provided Verbal Education On Nutrition, Labs, When to see the doctor, Other, Medication, Exercise, [employer] on [employer]. Mailed handout on locations and hours of  operation.]  Labs Reviewed Kidney Function, Hgb A1c  [06/23/23 Creatinine 1.06, GFR 58, calcium 8.5  last A1C was 4.0% on 07/30/22]  Mental Health Interventions   Mental Health Discussed/Reviewed Mental Health Discussed, Mental Health Reviewed, Refer to Social Work for resources  Refer to Social Work for resources regarding Other  [ongoing food resource needs]  Nutrition Interventions   Nutrition Discussed/Reviewed Nutrition Discussed, Nutrition Reviewed, Increasing proteins, Adding fruits and vegetables, Decreasing salt  [patient is eating 1-2 meals per day due to food insecurity. Recommended to eat 6 small meals per day since she has a history of bariatric surgery]  Pharmacy Interventions   Pharmacy Dicussed/Reviewed Pharmacy Topics Discussed, Pharmacy Topics Reviewed, Medications and their functions  [taking medications as prescribed. No difficulty with obtaining medications. Covered by insurance.]  Safety Interventions   Safety Discussed/Reviewed Safety Discussed, Safety Reviewed  Advanced Directive Interventions   Advanced Directives Discussed/Reviewed Advanced Directives Discussed, Advanced Directives Reviewed       Follow up plan: Follow up call scheduled for 09/29/23    Encounter Outcome:  Patient Visit Completed   Demetrios Loll, RN, BSN Care Management Coordinator [facility]  [facility] Direct Dial: [phone] Main #: [phone]

## 2023-09-02 ENCOUNTER — Ambulatory Visit: Payer: 59 | Admitting: Orthopedic Surgery

## 2023-09-02 VITALS — BP 135/84 | HR 60 | Ht 60.0 in | Wt 130.0 lb

## 2023-09-02 DIAGNOSIS — S42291D Other displaced fracture of upper end of right humerus, subsequent encounter for fracture with routine healing: Secondary | ICD-10-CM

## 2023-09-02 DIAGNOSIS — S82035D Nondisplaced transverse fracture of left patella, subsequent encounter for closed fracture with routine healing: Secondary | ICD-10-CM | POA: Diagnosis not present

## 2023-09-02 NOTE — Progress Notes (Signed)
Chief Complaint  Patient presents with   Shoulder Pain    Right shoulder fracture 02/10/23 improving   Encounter Diagnoses  Name Primary?   Closed fracture of head of right humerus with routine healing, subsequent encounter 02/10/23 Yes   Closed nondisplaced transverse fracture of left patella with routine healing, subsequent encounter 02/10/23    She says she is doing well and has no complaints status post fracture of the proximal humerus on the right and left patella  Exam findings reveal normal range of motion of the left knee with good stability  On the right side she can place her hand on top of her head and she uses accommodative measures to get forward elevation of approximately 130 degrees  I released her she will follow-up if there are any problems

## 2023-09-07 DIAGNOSIS — R92321 Mammographic fibroglandular density, right breast: Secondary | ICD-10-CM | POA: Diagnosis not present

## 2023-09-07 DIAGNOSIS — R928 Other abnormal and inconclusive findings on diagnostic imaging of breast: Secondary | ICD-10-CM | POA: Diagnosis not present

## 2023-09-07 DIAGNOSIS — R921 Mammographic calcification found on diagnostic imaging of breast: Secondary | ICD-10-CM | POA: Diagnosis not present

## 2023-09-15 DIAGNOSIS — S82092S Other fracture of left patella, sequela: Secondary | ICD-10-CM | POA: Diagnosis not present

## 2023-09-15 DIAGNOSIS — J449 Chronic obstructive pulmonary disease, unspecified: Secondary | ICD-10-CM | POA: Diagnosis not present

## 2023-09-24 DIAGNOSIS — J449 Chronic obstructive pulmonary disease, unspecified: Secondary | ICD-10-CM | POA: Diagnosis not present

## 2023-09-29 ENCOUNTER — Ambulatory Visit: Payer: Self-pay | Admitting: *Deleted

## 2023-09-29 NOTE — Patient Outreach (Signed)
  Care Coordination   09/29/2023 Name: Kimberly [name] MRN: [id number] DOB: Oct 29, 1956   Care Coordination Outreach Attempts:  An unsuccessful telephone outreach was attempted for a scheduled appointment today.  Follow Up Plan:  Additional outreach attempts will be made to offer the patient care coordination information and services.   Encounter Outcome:  No Answer. Left HIPAA compliant VM.   Care Coordination Interventions:  No, not indicated. Staff message sent to care guide requesting outreach and rescheduling.    Demetrios Loll, RN, BSN Care Management Coordinator [facility]  [facility] Direct Dial: [phone] Main #: [phone]

## 2023-10-03 ENCOUNTER — Telehealth: Payer: Self-pay | Admitting: *Deleted

## 2023-10-03 NOTE — Progress Notes (Unsigned)
  Care Coordination Note  10/03/2023 Name: Kimberly [name] MRN: [id number] DOB: Jan 22, 1956  [name] is a 67 y.o. year old female who is a primary care patient of Sherril Croon, Dhruv B, MD and is actively engaged with the care management team. I reached out to [name] by phone today to assist with re-scheduling a follow up visit with the RN Case Manager  Follow up plan: Unsuccessful telephone outreach attempt made. A HIPAA compliant phone message was left for the patient providing contact information and requesting a return call.   [facility]  Care Coordination Care Guide  Direct Dial: [phone]

## 2023-10-04 NOTE — Progress Notes (Unsigned)
  Care Coordination Note  10/04/2023 Name: Kimberly [name] MRN: [id number] DOB: Jan 10, 1956  Kimberly [name] is a 67 y.o. year old female who is a primary care patient of Sherril Croon, Dhruv B, MD and is actively engaged with the care management team. I reached out to [name] by phone today to assist with re-scheduling a follow up visit with the RN Case Manager  Follow up plan: Unsuccessful telephone outreach attempt made. A HIPAA compliant phone message was left for the patient providing contact information and requesting a return call.   [facility]  Care Coordination Care Guide  Direct Dial: [phone]

## 2023-10-06 NOTE — Progress Notes (Signed)
  Care Coordination Note  10/06/2023 Name: [name] MRN: [id number] DOB: [birth year]  Kimberly [name] is a 67 y.o. year old female who is a primary care patient of Sherril Croon, Dhruv B, MD and is actively engaged with the care management team. I reached out to [name] by phone today to assist with re-scheduling a follow up visit with the RN Case Manager  Follow up plan: Unsuccessful telephone outreach attempt made. A HIPAA compliant phone message was left for the patient providing contact information and requesting a return call.  We have been unable to make contact with the patient for follow up. The care management team is available to follow up with the patient after provider conversation with the patient regarding recommendation for care management engagement and subsequent re-referral to the care management team.   [facility] Coordination Care Guide  Direct Dial: [phone]

## 2023-10-07 DIAGNOSIS — J309 Allergic rhinitis, unspecified: Secondary | ICD-10-CM | POA: Diagnosis not present

## 2023-10-07 DIAGNOSIS — I700 Atherosclerosis of aorta: Secondary | ICD-10-CM | POA: Diagnosis not present

## 2023-10-07 DIAGNOSIS — Z299 Encounter for prophylactic measures, unspecified: Secondary | ICD-10-CM | POA: Diagnosis not present

## 2023-10-07 DIAGNOSIS — J441 Chronic obstructive pulmonary disease with (acute) exacerbation: Secondary | ICD-10-CM | POA: Diagnosis not present

## 2023-10-07 DIAGNOSIS — R52 Pain, unspecified: Secondary | ICD-10-CM | POA: Diagnosis not present

## 2023-10-07 DIAGNOSIS — R058 Other specified cough: Secondary | ICD-10-CM | POA: Diagnosis not present

## 2023-10-07 DIAGNOSIS — J029 Acute pharyngitis, unspecified: Secondary | ICD-10-CM | POA: Diagnosis not present

## 2023-10-16 DIAGNOSIS — J449 Chronic obstructive pulmonary disease, unspecified: Secondary | ICD-10-CM | POA: Diagnosis not present

## 2023-10-16 DIAGNOSIS — S82092S Other fracture of left patella, sequela: Secondary | ICD-10-CM | POA: Diagnosis not present

## 2023-10-24 DIAGNOSIS — J449 Chronic obstructive pulmonary disease, unspecified: Secondary | ICD-10-CM | POA: Diagnosis not present

## 2023-10-25 ENCOUNTER — Other Ambulatory Visit (INDEPENDENT_AMBULATORY_CARE_PROVIDER_SITE_OTHER): Payer: Self-pay | Admitting: Gastroenterology

## 2023-10-25 NOTE — Telephone Encounter (Signed)
Last seen 07/18/23

## 2023-11-03 ENCOUNTER — Other Ambulatory Visit (INDEPENDENT_AMBULATORY_CARE_PROVIDER_SITE_OTHER): Payer: Self-pay | Admitting: Gastroenterology

## 2023-11-15 DIAGNOSIS — J449 Chronic obstructive pulmonary disease, unspecified: Secondary | ICD-10-CM | POA: Diagnosis not present

## 2023-11-15 DIAGNOSIS — S82092S Other fracture of left patella, sequela: Secondary | ICD-10-CM | POA: Diagnosis not present

## 2023-11-21 DIAGNOSIS — Z299 Encounter for prophylactic measures, unspecified: Secondary | ICD-10-CM | POA: Diagnosis not present

## 2023-11-21 DIAGNOSIS — R109 Unspecified abdominal pain: Secondary | ICD-10-CM | POA: Diagnosis not present

## 2023-11-21 DIAGNOSIS — G4700 Insomnia, unspecified: Secondary | ICD-10-CM | POA: Diagnosis not present

## 2023-11-21 DIAGNOSIS — E039 Hypothyroidism, unspecified: Secondary | ICD-10-CM | POA: Diagnosis not present

## 2023-11-21 DIAGNOSIS — R0981 Nasal congestion: Secondary | ICD-10-CM | POA: Diagnosis not present

## 2023-11-23 DIAGNOSIS — M4854XA Collapsed vertebra, not elsewhere classified, thoracic region, initial encounter for fracture: Secondary | ICD-10-CM | POA: Diagnosis not present

## 2023-11-23 DIAGNOSIS — M19012 Primary osteoarthritis, left shoulder: Secondary | ICD-10-CM | POA: Diagnosis not present

## 2023-11-23 DIAGNOSIS — Z91040 Latex allergy status: Secondary | ICD-10-CM | POA: Diagnosis not present

## 2023-11-23 DIAGNOSIS — E119 Type 2 diabetes mellitus without complications: Secondary | ICD-10-CM | POA: Diagnosis not present

## 2023-11-23 DIAGNOSIS — M542 Cervicalgia: Secondary | ICD-10-CM | POA: Diagnosis not present

## 2023-11-23 DIAGNOSIS — M47812 Spondylosis without myelopathy or radiculopathy, cervical region: Secondary | ICD-10-CM | POA: Diagnosis not present

## 2023-11-23 DIAGNOSIS — Z041 Encounter for examination and observation following transport accident: Secondary | ICD-10-CM | POA: Diagnosis not present

## 2023-11-23 DIAGNOSIS — Z882 Allergy status to sulfonamides status: Secondary | ICD-10-CM | POA: Diagnosis not present

## 2023-11-23 DIAGNOSIS — M549 Dorsalgia, unspecified: Secondary | ICD-10-CM | POA: Diagnosis not present

## 2023-11-23 DIAGNOSIS — I509 Heart failure, unspecified: Secondary | ICD-10-CM | POA: Diagnosis not present

## 2023-11-23 DIAGNOSIS — Z885 Allergy status to narcotic agent status: Secondary | ICD-10-CM | POA: Diagnosis not present

## 2023-11-23 DIAGNOSIS — M19011 Primary osteoarthritis, right shoulder: Secondary | ICD-10-CM | POA: Diagnosis not present

## 2023-11-23 DIAGNOSIS — Z5941 Food insecurity: Secondary | ICD-10-CM | POA: Diagnosis not present

## 2023-11-23 DIAGNOSIS — I110 Hypertensive heart disease with heart failure: Secondary | ICD-10-CM | POA: Diagnosis not present

## 2023-11-24 DIAGNOSIS — J449 Chronic obstructive pulmonary disease, unspecified: Secondary | ICD-10-CM | POA: Diagnosis not present

## 2023-11-26 ENCOUNTER — Emergency Department (HOSPITAL_COMMUNITY)
Admission: EM | Admit: 2023-11-26 | Discharge: 2023-11-26 | Disposition: A | Discharge status: Home / Self Care | Payer: 59 | Attending: Emergency Medicine | Admitting: Emergency Medicine

## 2023-11-26 ENCOUNTER — Emergency Department (HOSPITAL_COMMUNITY): Payer: 59

## 2023-11-26 ENCOUNTER — Other Ambulatory Visit: Payer: Self-pay

## 2023-11-26 ENCOUNTER — Encounter (HOSPITAL_COMMUNITY): Payer: Self-pay | Admitting: *Deleted

## 2023-11-26 DIAGNOSIS — J449 Chronic obstructive pulmonary disease, unspecified: Secondary | ICD-10-CM | POA: Insufficient documentation

## 2023-11-26 DIAGNOSIS — R911 Solitary pulmonary nodule: Secondary | ICD-10-CM | POA: Insufficient documentation

## 2023-11-26 DIAGNOSIS — N281 Cyst of kidney, acquired: Secondary | ICD-10-CM | POA: Diagnosis not present

## 2023-11-26 DIAGNOSIS — M4856XA Collapsed vertebra, not elsewhere classified, lumbar region, initial encounter for fracture: Secondary | ICD-10-CM | POA: Diagnosis not present

## 2023-11-26 DIAGNOSIS — N2889 Other specified disorders of kidney and ureter: Secondary | ICD-10-CM | POA: Diagnosis not present

## 2023-11-26 DIAGNOSIS — K449 Diaphragmatic hernia without obstruction or gangrene: Secondary | ICD-10-CM | POA: Diagnosis not present

## 2023-11-26 DIAGNOSIS — Z79899 Other long term (current) drug therapy: Secondary | ICD-10-CM | POA: Diagnosis not present

## 2023-11-26 DIAGNOSIS — I110 Hypertensive heart disease with heart failure: Secondary | ICD-10-CM | POA: Diagnosis not present

## 2023-11-26 DIAGNOSIS — M85811 Other specified disorders of bone density and structure, right shoulder: Secondary | ICD-10-CM | POA: Diagnosis not present

## 2023-11-26 DIAGNOSIS — M4854XA Collapsed vertebra, not elsewhere classified, thoracic region, initial encounter for fracture: Secondary | ICD-10-CM | POA: Insufficient documentation

## 2023-11-26 DIAGNOSIS — I2510 Atherosclerotic heart disease of native coronary artery without angina pectoris: Secondary | ICD-10-CM | POA: Insufficient documentation

## 2023-11-26 DIAGNOSIS — Z8673 Personal history of transient ischemic attack (TIA), and cerebral infarction without residual deficits: Secondary | ICD-10-CM | POA: Insufficient documentation

## 2023-11-26 DIAGNOSIS — Y92410 Unspecified street and highway as the place of occurrence of the external cause: Secondary | ICD-10-CM | POA: Insufficient documentation

## 2023-11-26 DIAGNOSIS — J45909 Unspecified asthma, uncomplicated: Secondary | ICD-10-CM | POA: Insufficient documentation

## 2023-11-26 DIAGNOSIS — I10 Essential (primary) hypertension: Secondary | ICD-10-CM | POA: Diagnosis not present

## 2023-11-26 DIAGNOSIS — R918 Other nonspecific abnormal finding of lung field: Secondary | ICD-10-CM | POA: Diagnosis not present

## 2023-11-26 DIAGNOSIS — Z91040 Latex allergy status: Secondary | ICD-10-CM | POA: Insufficient documentation

## 2023-11-26 DIAGNOSIS — R0781 Pleurodynia: Secondary | ICD-10-CM | POA: Diagnosis not present

## 2023-11-26 DIAGNOSIS — Z041 Encounter for examination and observation following transport accident: Secondary | ICD-10-CM | POA: Diagnosis not present

## 2023-11-26 DIAGNOSIS — S060X0A Concussion without loss of consciousness, initial encounter: Secondary | ICD-10-CM | POA: Insufficient documentation

## 2023-11-26 DIAGNOSIS — I509 Heart failure, unspecified: Secondary | ICD-10-CM | POA: Insufficient documentation

## 2023-11-26 DIAGNOSIS — E039 Hypothyroidism, unspecified: Secondary | ICD-10-CM | POA: Diagnosis not present

## 2023-11-26 DIAGNOSIS — S0990XA Unspecified injury of head, initial encounter: Secondary | ICD-10-CM | POA: Diagnosis present

## 2023-11-26 DIAGNOSIS — M19011 Primary osteoarthritis, right shoulder: Secondary | ICD-10-CM | POA: Diagnosis not present

## 2023-11-26 LAB — CBC WITH DIFFERENTIAL/PLATELET
Abs Immature Granulocytes: 0 10*3/uL (ref 0.00–0.07)
Basophils Absolute: 0 10*3/uL (ref 0.0–0.1)
Basophils Relative: 1 %
Eosinophils Absolute: 0.1 10*3/uL (ref 0.0–0.5)
Eosinophils Relative: 2 %
HCT: 38.6 % (ref 36.0–46.0)
Hemoglobin: 13.1 g/dL (ref 12.0–15.0)
Immature Granulocytes: 0 %
Lymphocytes Relative: 43 %
Lymphs Abs: 1.5 10*3/uL (ref 0.7–4.0)
MCH: 34.4 pg — ABNORMAL HIGH (ref 26.0–34.0)
MCHC: 33.9 g/dL (ref 30.0–36.0)
MCV: 101.3 fL — ABNORMAL HIGH (ref 80.0–100.0)
Monocytes Absolute: 0.4 10*3/uL (ref 0.1–1.0)
Monocytes Relative: 10 %
Neutro Abs: 1.6 10*3/uL — ABNORMAL LOW (ref 1.7–7.7)
Neutrophils Relative %: 44 %
Platelets: 123 10*3/uL — ABNORMAL LOW (ref 150–400)
RBC: 3.81 MIL/uL — ABNORMAL LOW (ref 3.87–5.11)
RDW: 13.1 % (ref 11.5–15.5)
WBC: 3.6 10*3/uL — ABNORMAL LOW (ref 4.0–10.5)
nRBC: 0 % (ref 0.0–0.2)

## 2023-11-26 LAB — COMPREHENSIVE METABOLIC PANEL
ALT: 29 U/L (ref 0–44)
AST: 38 U/L (ref 15–41)
Albumin: 3.7 g/dL (ref 3.5–5.0)
Alkaline Phosphatase: 85 U/L (ref 38–126)
Anion gap: 7 (ref 5–15)
BUN: 18 mg/dL (ref 8–23)
CO2: 23 mmol/L (ref 22–32)
Calcium: 9.2 mg/dL (ref 8.9–10.3)
Chloride: 111 mmol/L (ref 98–111)
Creatinine, Ser: 1.04 mg/dL — ABNORMAL HIGH (ref 0.44–1.00)
GFR, Estimated: 59 mL/min — ABNORMAL LOW (ref >60)
Glucose, Bld: 88 mg/dL (ref 70–99)
Potassium: 4.3 mmol/L (ref 3.5–5.1)
Sodium: 141 mmol/L (ref 135–145)
Total Bilirubin: 1.5 mg/dL — ABNORMAL HIGH (ref 0.0–1.2)
Total Protein: 6.5 g/dL (ref 6.5–8.1)

## 2023-11-26 LAB — I-STAT CHEM 8, ED
BUN: 17 mg/dL (ref 8–23)
Calcium, Ion: 1.18 mmol/L (ref 1.15–1.40)
Chloride: 110 mmol/L (ref 98–111)
Creatinine, Ser: 1.1 mg/dL — ABNORMAL HIGH (ref 0.44–1.00)
Glucose, Bld: 92 mg/dL (ref 70–99)
HCT: 33 % — ABNORMAL LOW (ref 36.0–46.0)
Hemoglobin: 11.2 g/dL — ABNORMAL LOW (ref 12.0–15.0)
Potassium: 3.9 mmol/L (ref 3.5–5.1)
Sodium: 144 mmol/L (ref 135–145)
TCO2: 22 mmol/L (ref 22–32)

## 2023-11-26 MED ORDER — MORPHINE SULFATE (PF) 4 MG/ML IV SOLN
4.0000 mg | Freq: Once | INTRAVENOUS | Status: AC
  Administered 2023-11-26: 4 mg via INTRAVENOUS
  Filled 2023-11-26: qty 1

## 2023-11-26 MED ORDER — IOHEXOL 300 MG/ML  SOLN
100.0000 mL | Freq: Once | INTRAMUSCULAR | Status: AC | PRN
  Administered 2023-11-26: 100 mL via INTRAVENOUS
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
## 2023-11-26 NOTE — Discharge Instructions (Addendum)
 It was a pleasure caring for you today.  Please follow-up with primary care provider.  Seek emergency care experiencing any new or worsening symptoms.
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
## 2023-12-01 DIAGNOSIS — Z299 Encounter for prophylactic measures, unspecified: Secondary | ICD-10-CM | POA: Diagnosis not present

## 2023-12-01 DIAGNOSIS — R5383 Other fatigue: Secondary | ICD-10-CM | POA: Diagnosis not present

## 2023-12-01 DIAGNOSIS — I10 Essential (primary) hypertension: Secondary | ICD-10-CM | POA: Diagnosis not present

## 2023-12-01 DIAGNOSIS — M2550 Pain in unspecified joint: Secondary | ICD-10-CM | POA: Diagnosis not present

## 2023-12-01 DIAGNOSIS — S42213A Unspecified displaced fracture of surgical neck of unspecified humerus, initial encounter for closed fracture: Secondary | ICD-10-CM | POA: Diagnosis not present

## 2023-12-08 ENCOUNTER — Encounter: Payer: Self-pay | Admitting: Orthopedic Surgery

## 2023-12-08 ENCOUNTER — Ambulatory Visit (INDEPENDENT_AMBULATORY_CARE_PROVIDER_SITE_OTHER): Payer: 59 | Admitting: Orthopedic Surgery

## 2023-12-08 VITALS — BP 160/87 | HR 68 | Ht 59.0 in | Wt 127.0 lb

## 2023-12-08 DIAGNOSIS — M25511 Pain in right shoulder: Secondary | ICD-10-CM

## 2023-12-08 DIAGNOSIS — M542 Cervicalgia: Secondary | ICD-10-CM

## 2023-12-08 DIAGNOSIS — S42294A Other nondisplaced fracture of upper end of right humerus, initial encounter for closed fracture: Secondary | ICD-10-CM
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
## 2023-12-08 NOTE — Patient Instructions (Signed)
Physical therapy has been ordered for you at [facility]. They should call you to schedule, [phone] is the phone number to call, if you want to call to schedule.   
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
## 2023-12-14 ENCOUNTER — Ambulatory Visit (HOSPITAL_COMMUNITY): Payer: 59 | Admitting: Physical Therapy

## 2023-12-16 DIAGNOSIS — S82092S Other fracture of left patella, sequela: Secondary | ICD-10-CM | POA: Diagnosis not present

## 2023-12-16 DIAGNOSIS — J449 Chronic obstructive pulmonary disease, unspecified: Secondary | ICD-10-CM | POA: Diagnosis not present

## 2023-12-25 DIAGNOSIS — J449 Chronic obstructive pulmonary disease, unspecified: Secondary | ICD-10-CM | POA: Diagnosis not present

## 2023-12-30 DIAGNOSIS — I700 Atherosclerosis of aorta: Secondary | ICD-10-CM | POA: Diagnosis not present

## 2023-12-30 DIAGNOSIS — J449 Chronic obstructive pulmonary disease, unspecified: Secondary | ICD-10-CM | POA: Diagnosis not present

## 2023-12-30 DIAGNOSIS — E261 Secondary hyperaldosteronism: Secondary | ICD-10-CM | POA: Diagnosis not present

## 2023-12-30 DIAGNOSIS — K7460 Unspecified cirrhosis of liver: Secondary | ICD-10-CM | POA: Diagnosis not present

## 2023-12-30 DIAGNOSIS — I10 Essential (primary) hypertension: Secondary | ICD-10-CM | POA: Diagnosis not present

## 2023-12-30 DIAGNOSIS — Z299 Encounter for prophylactic measures, unspecified: Secondary | ICD-10-CM | POA: Diagnosis not present

## 2023-12-30 DIAGNOSIS — D692 Other nonthrombocytopenic purpura: Secondary | ICD-10-CM | POA: Diagnosis not present

## 2024-01-16 DIAGNOSIS — S82092S Other fracture of left patella, sequela: Secondary | ICD-10-CM | POA: Diagnosis not present

## 2024-01-16 DIAGNOSIS — J449 Chronic obstructive pulmonary disease, unspecified: Secondary | ICD-10-CM | POA: Diagnosis not present

## 2024-01-19 ENCOUNTER — Ambulatory Visit (INDEPENDENT_AMBULATORY_CARE_PROVIDER_SITE_OTHER): Payer: 59 | Admitting: Gastroenterology

## 2024-01-22 DIAGNOSIS — J449 Chronic obstructive pulmonary disease, unspecified: Secondary | ICD-10-CM | POA: Diagnosis not present

## 2024-02-03 ENCOUNTER — Other Ambulatory Visit (INDEPENDENT_AMBULATORY_CARE_PROVIDER_SITE_OTHER): Payer: Self-pay | Admitting: Gastroenterology

## 2024-02-22 DIAGNOSIS — J449 Chronic obstructive pulmonary disease, unspecified: Secondary | ICD-10-CM | POA: Diagnosis not present

## 2024-03-16 DIAGNOSIS — K7460 Unspecified cirrhosis of liver: Secondary | ICD-10-CM | POA: Diagnosis not present

## 2024-03-16 DIAGNOSIS — H3530 Unspecified macular degeneration: Secondary | ICD-10-CM | POA: Diagnosis not present

## 2024-03-16 DIAGNOSIS — Z6823 Body mass index (BMI) 23.0-23.9, adult: Secondary | ICD-10-CM | POA: Diagnosis not present

## 2024-03-16 DIAGNOSIS — J449 Chronic obstructive pulmonary disease, unspecified: Secondary | ICD-10-CM | POA: Diagnosis not present

## 2024-03-16 DIAGNOSIS — R079 Chest pain, unspecified: Secondary | ICD-10-CM | POA: Diagnosis not present

## 2024-03-23 DIAGNOSIS — J449 Chronic obstructive pulmonary disease, unspecified: Secondary | ICD-10-CM | POA: Diagnosis not present

## 2024-03-26 ENCOUNTER — Encounter (HOSPITAL_COMMUNITY): Payer: Self-pay | Admitting: Internal Medicine

## 2024-03-28 ENCOUNTER — Other Ambulatory Visit (HOSPITAL_COMMUNITY): Payer: Self-pay | Admitting: Internal Medicine

## 2024-03-28 DIAGNOSIS — R921 Mammographic calcification found on diagnostic imaging of breast: Secondary | ICD-10-CM

## 2024-03-29 DIAGNOSIS — Z131 Encounter for screening for diabetes mellitus: Secondary | ICD-10-CM | POA: Diagnosis not present

## 2024-03-29 DIAGNOSIS — E039 Hypothyroidism, unspecified: Secondary | ICD-10-CM | POA: Diagnosis not present

## 2024-03-29 DIAGNOSIS — Z1389 Encounter for screening for other disorder: Secondary | ICD-10-CM | POA: Diagnosis not present

## 2024-03-29 DIAGNOSIS — D559 Anemia due to enzyme disorder, unspecified: Secondary | ICD-10-CM | POA: Diagnosis not present

## 2024-03-29 DIAGNOSIS — E559 Vitamin D deficiency, unspecified: Secondary | ICD-10-CM | POA: Diagnosis not present

## 2024-03-29 DIAGNOSIS — Z13220 Encounter for screening for lipoid disorders: Secondary | ICD-10-CM | POA: Diagnosis not present

## 2024-03-29 DIAGNOSIS — Z1321 Encounter for screening for nutritional disorder: Secondary | ICD-10-CM | POA: Diagnosis not present

## 2024-04-05 ENCOUNTER — Telehealth (INDEPENDENT_AMBULATORY_CARE_PROVIDER_SITE_OTHER): Payer: Self-pay | Admitting: Gastroenterology

## 2024-04-05 DIAGNOSIS — Z6823 Body mass index (BMI) 23.0-23.9, adult: Secondary | ICD-10-CM | POA: Diagnosis not present

## 2024-04-05 DIAGNOSIS — K7460 Unspecified cirrhosis of liver: Secondary | ICD-10-CM

## 2024-04-05 DIAGNOSIS — H3530 Unspecified macular degeneration: Secondary | ICD-10-CM | POA: Diagnosis not present

## 2024-04-05 DIAGNOSIS — R079 Chest pain, unspecified: Secondary | ICD-10-CM | POA: Diagnosis not present

## 2024-04-05 DIAGNOSIS — R4582 Worries: Secondary | ICD-10-CM | POA: Diagnosis not present

## 2024-04-05 NOTE — Telephone Encounter (Signed)
 I called patient to make her a follow up OV. She is aware that she is on the cancellation list to get sooner appointment and needed one before the end of May due to insurance changing. She also said she needed to schedule her U/S before the end of May. I told her the scheduler would have to call her back. [phone]

## 2024-04-05 NOTE — Addendum Note (Signed)
 Addended by: Anamika Kueker on: 04/05/2024 02:36 PM   Modules accepted: Orders

## 2024-04-05 NOTE — Telephone Encounter (Signed)
 US  ordered and scheduled. US  scheduled for 04/19/24 at 8:30am, pt is to arrive at [facility] at 8:15am. NPO after midnight. Left detailed message on voicemail. Will send appt letter

## 2024-04-05 NOTE — Telephone Encounter (Signed)
 Last US  completed on 07/29/23. I do not see any note about repeating US . Please advise. Thank you!

## 2024-04-09 ENCOUNTER — Ambulatory Visit (INDEPENDENT_AMBULATORY_CARE_PROVIDER_SITE_OTHER): Admitting: Gastroenterology

## 2024-04-09 ENCOUNTER — Encounter (INDEPENDENT_AMBULATORY_CARE_PROVIDER_SITE_OTHER): Payer: Self-pay | Admitting: Gastroenterology

## 2024-04-09 VITALS — BP 115/72 | HR 65 | Temp 98.0°F | Ht <= 58 in | Wt 127.5 lb

## 2024-04-09 DIAGNOSIS — K589 Irritable bowel syndrome without diarrhea: Secondary | ICD-10-CM

## 2024-04-09 DIAGNOSIS — K219 Gastro-esophageal reflux disease without esophagitis: Secondary | ICD-10-CM | POA: Diagnosis not present

## 2024-04-09 DIAGNOSIS — R928 Other abnormal and inconclusive findings on diagnostic imaging of breast: Secondary | ICD-10-CM | POA: Diagnosis not present

## 2024-04-09 DIAGNOSIS — K580 Irritable bowel syndrome with diarrhea: Secondary | ICD-10-CM

## 2024-04-09 DIAGNOSIS — R921 Mammographic calcification found on diagnostic imaging of breast: Secondary | ICD-10-CM | POA: Diagnosis not present

## 2024-04-09 DIAGNOSIS — K7460 Unspecified cirrhosis of liver: Secondary | ICD-10-CM

## 2024-04-09 DIAGNOSIS — K7581 Nonalcoholic steatohepatitis (NASH): Secondary | ICD-10-CM | POA: Diagnosis not present

## 2024-04-09 NOTE — Progress Notes (Addendum)
 Referring Provider: Orlena Bitters, MD Primary Care Physician:  [name], MD Primary GI Physician: Dr. Sammi Crick   Chief Complaint  Patient presents with   Cirrhosis    Pt arrives to follow up on Cirrhosis, GERD,IBS. Pt states stomach is still "flippy floppy". Pt would like to discuss progress of liver. Pt has not had blood work done at this time. Pt has US  04/19/24. Pt would like refill on Procto rectal cream.    HPI:   Kimberly [name] is a 68 y.o. female with past medical history of  compensated NASH cirrhosis, CHF, coronary artery disease, depression, diabetes, COPD, IBS-D, hypothyroidism, hypertension, OSA, stroke, seizures, obesity status post duodenal switch, macular degeneration   Patient presenting today for:  Follow up of Cirrhosis, IBS, GERD   Last seen August 2024, at that time having more acid regurgitation, taking protonix  40mg  daily, increased famotidine  to 40mg  BID. Taking dicyclomine  with each meal due to cramping. Avoiding trigger foods. Having looser stools, 5-6 per day. Some bloating at times  Recommended RUQ US , afp, Repeat MELD labs January, start omeprazole  40mg , continue famotidine  40mg  at bedtime, xifaxan  550mg  BID x2 weeks, continue lasix  40mg /spironolactone  25mg  daily, continue zofran  PRN  AFP in August was 2  CBC and CMP in January with plt 123k T bili 1.5 Scheduled for RUQ US  04/19/24   Present:  Taking omeprazole  40mg  daily and famotidine  40mg  at bedtime, feels improvement but still with some reflux symptoms about 4 times per week. Taking tums to help. She denies any dysphagia. Cannot pinpiont any specific triggering factors, stress makes her symptoms worse. Having some nausea, occasional vomiting a few times per week.  She notes some itching at times, occasionally will be on her leg, sometimes on her head. Denies jaundice. Denies episodes of confusion, does report she loses focus at times. She endorses some swelling to her abdomen at times though  reports usually does not get tight, sometimes has swelling in her legs.   Having 6-7 formed to looser stools per day. She reports nocturnal BMs have decreased. She has not been taking dicyclomine  as the last time she took it It made her very drowsy and nauseated. She continues to have fecal urgency. Still has some cramping in abdomen at times. No rectal bleeding or melena. She states that she took xifaxan  for about 3-4 days and this made her feel "deathly ill" so she stopped it.   States she had some recent labs with PCP that were abnormal, referred to hematology for concern for possible leukemia. I do not have these available for review.    Previous MELD 3.0 (labs from July/August) 11 Cirrhosis related questions: Episodes of confusion/disorientation: none  Taking diuretics? Lasix  40, spironolactone  25mg  daily  Beta blockers?  No  Prior history of variceal banding? No (none on recent EGD 06/2023) Prior episodes of SBP? No  Last liver imaging: 01/2023 cirrhosis, no lesions  Alcohol use: no   Last Colonoscopy:06/28/23  - The examined portion of the ileum was normal.                           - The entire examined colon is normal.                           - The distal rectum and anal verge are normal on  retroflexion view.                           - No specimens collected.   Last Endoscopy:06/28/23  - Normal esophagus.                           - A duodenal switch and sleeve gastrectomy was                            found, characterized by healthy appearing mucosa.                           - Normal examined duodenum.                           - No specimens collected.   Recommendations:  Repeat EGD in 3 years, TCS in 5 years  Filed Weights   04/09/24 1326  Weight: 127 lb 8 oz (57.8 kg)     Past Medical History:  Diagnosis Date   Anemia    PMH: as a child   Arthritis    Asthma    CAD (coronary artery disease)    nonobstructive by cath, 6/08 (false  positive Cardiolite ) normal stress echo, 7/11   CHF (congestive heart failure) (HCC)    Chronic back pain    Chronic bronchitis (HCC)    Cirrhosis (HCC)    Complication of anesthesia    had an asthma attack when woke up from procedure   Complication of anesthesia    asthma attack with one surgery   COPD (chronic obstructive pulmonary disease) (HCC)    Degenerative joint disease    Depression    DJD (degenerative joint disease)    Fatty liver    Fibromyalgia    GERD (gastroesophageal reflux disease)    Glaucoma    Headache(784.0)    Heart failure, diastolic, chronic (HCC)    Heart murmur    PMH:As a child only   Hernia of abdominal cavity    "upper and lower hernia"   History of hiatal hernia    HTN (hypertension)    Hypertension    Hypothyroidism    IBS (irritable bowel syndrome)    Morbid obesity (HCC)    Neuropathy    associated with diabetes   Obstructive sleep apnea    Pancreatitis    Pinched nerve in neck    Pneumonia    as a child   PONV (postoperative nausea and vomiting)    Pulmonary hypertension (HCC)    Rheumatic fever    PMH: as a child   Seizures (HCC)    PMH: only as a child   Seizures (HCC)    in childhood   Shortness of breath    Sleep apnea    Spinal stenosis    Stroke (HCC)    12/09/2013    Past Surgical History:  Procedure Laterality Date   ABDOMINAL HYSTERECTOMY     ABDOMINAL HYSTERECTOMY     partial hysterectomy   APPENDECTOMY     BACK SURGERY     BACK SURGERY     disc surgery with complications and damage to right side   BARIATRIC SURGERY     DS in June 2020   BILATERAL KNEE ARTHROSCOPY  2000, 2009   BIOPSY  06/16/2021  Procedure: BIOPSY;  Surgeon: Umberto Ganong, Bearl Limes, MD;  Location: [facility];  Service: Gastroenterology;;   BREAST CYST EXCISION Left    CARDIAC CATHETERIZATION     2009   CATARACT EXTRACTION W/ INTRAOCULAR LENS  IMPLANT, BILATERAL     CHOLECYSTECTOMY  11/22/1992   CHOLECYSTECTOMY     COLONOSCOPY N/A  08/01/2013   Procedure: COLONOSCOPY;  Surgeon: Ruby Corporal, MD;  Location: [facility];  Service: Endoscopy;  Laterality: N/A;  930   COLONOSCOPY N/A 08/03/2017   Procedure: COLONOSCOPY;  Surgeon: Ruby Corporal, MD;  Location: [facility];  Service: Endoscopy;  Laterality: N/A;  12:00   COLONOSCOPY WITH PROPOFOL  N/A 06/28/2023   Procedure: COLONOSCOPY WITH PROPOFOL ;  Surgeon: Urban Garden, MD;  Location: [facility];  Service: Gastroenterology;  Laterality: N/A;  12:30pm;asa 3   CYST EXCISION     Left breast   ESOPHAGOGASTRODUODENOSCOPY (EGD) WITH PROPOFOL  N/A 06/16/2021   Procedure: ESOPHAGOGASTRODUODENOSCOPY (EGD) WITH PROPOFOL ;  Surgeon: Urban Garden, MD;  Location: [facility];  Service: Gastroenterology;  Laterality: N/A;  12:50   ESOPHAGOGASTRODUODENOSCOPY (EGD) WITH PROPOFOL  N/A 06/28/2023   Procedure: ESOPHAGOGASTRODUODENOSCOPY (EGD) WITH PROPOFOL ;  Surgeon: Urban Garden, MD;  Location: [facility];  Service: Gastroenterology;  Laterality: N/A;  12:30pm;asa 3   EXCISIONAL HEMORRHOIDECTOMY     EYE SURGERY  11/22/1965   EYE SURGERY     bilateral cataract removal with lens implants   EYE SURGERY     growth removed from right eye lid   EYE SURGERY     bilateral eye surgery age 27 t"to straighten eyes'   heel (other)  11/23/2003   HEEL SPUR SURGERY     Right heel - growth removal and rebuilding of heel   HEMORRHOID SURGERY     HERNIA REPAIR     KNEE ARTHROSCOPY Bilateral    LUMBAR LAMINECTOMY/DECOMPRESSION MICRODISCECTOMY Right 08/23/2014   Procedure: LUMBAR LAMINECTOMY/DECOMPRESSION MICRODISCECTOMY 1 LEVEL;  Surgeon: Baruch Bosch, MD;  Location: [facility];  Service: Neurosurgery;  Laterality: Right;  LUMBAR LAMINECTOMY/DECOMPRESSION MICRODISCECTOMY 1 LEVEL LUMBAR 2-3   ovaries removed  11/22/2001   PARTIAL HYSTERECTOMY  11/23/1979   SHOULDER ARTHROSCOPY WITH DISTAL CLAVICLE RESECTION Left 08/06/2022   Procedure: SHOULDER  ARTHROSCOPY WITH DISTAL CLAVICLE RESECTION;  Surgeon: Janeth Medicus, MD;  Location: [facility] OR;  Service: Orthopedics;  Laterality: Left;   SHOULDER ARTHROSCOPY WITH ROTATOR CUFF REPAIR Left 08/06/2022   Procedure: SHOULDER ARTHROSCOPY WITH ROTATOR CUFF REPAIR;  Surgeon: Janeth Medicus, MD;  Location: [facility] OR;  Service: Orthopedics;  Laterality: Left;   SHOULDER ARTHROSCOPY WITH SUBACROMIAL DECOMPRESSION Left 08/06/2022   Procedure: SHOULDER ARTHROSCOPY WITH SUBACROMIAL DECOMPRESSIONWITH EXTENSIVE DEBRIDEMENT;  Surgeon: Janeth Medicus, MD;  Location: [facility] OR;  Service: Orthopedics;  Laterality: Left;   SPHINCTEROTOMY     spinahatomy  11/22/1990   TOTAL KNEE ARTHROPLASTY Right 11/05/2020   Procedure: RIGHT TOTAL KNEE ARTHROPLASTY;  Surgeon: Adah Acron, MD;  Location: [facility];  Service: Orthopedics;  Laterality: Right;   TUBAL LIGATION  11/22/1973   TUBAL LIGATION      Current Outpatient Medications  Medication Sig Dispense Refill   albuterol  (VENTOLIN  HFA) 108 (90 Base) MCG/ACT inhaler Inhale 2 puffs into the lungs 4 (four) times daily as needed for shortness of breath.     cyclobenzaprine  (FLEXERIL ) 5 MG tablet Take 1 tablet (5 mg total) by mouth every 8 (eight) hours as needed for muscle spasms. 20 tablet 0   famotidine  (  PEPCID ) 40 MG tablet Take 40 mg by mouth daily.     furosemide  (LASIX ) 20 MG tablet Take 20 mg by mouth 2 (two) times daily.     levothyroxine  (SYNTHROID ) 112 MCG tablet Take 112 mcg by mouth daily before breakfast.     Multiple Vitamin (MULTIVITAMIN PO) Take 2 tablets by mouth daily. ADEK vitamin chewable     omeprazole  (PRILOSEC) 40 MG capsule Take 1 capsule (40 mg total) by mouth daily. 90 capsule 3   ondansetron  (ZOFRAN ) 4 MG tablet TAKE 1 TABLET BY MOUTH EVERY 8 HOURS AS NEEDED FOR NAUSEA OR VOMITING 30 tablet 1   PRESCRIPTION MEDICATION Injection in both eyes every 8 weeks for macular deg     PROCTO-MED HC  2.5 % rectal cream PLACE 1 APPLICATION RECTALLY  TWICE DAILY 28 g 1   spironolactone  (ALDACTONE ) 25 MG tablet Take 25 mg by mouth daily.     TRELEGY ELLIPTA 100-62.5-25 MCG/ACT AEPB Inhale 1 puff into the lungs daily.     zolpidem (AMBIEN) 5 MG tablet Take 5 mg by mouth at bedtime.     denosumab  (PROLIA ) 60 MG/ML SOSY injection Inject 60 mg into the skin every 6 (six) months. (Patient not taking: Reported on 04/09/2024)     dicyclomine  (BENTYL ) 10 MG capsule TAKE ONE CAPSULE BY MOUTH THREE TIMES DAILY AS NEEDED FOR SPASMS (Patient not taking: Reported on 04/09/2024) 90 capsule 2   rifaximin  (XIFAXAN ) 550 MG TABS tablet Take 1 tablet (550 mg total) by mouth 3 (three) times daily. (Patient not taking: Reported on 04/09/2024) 42 tablet 0   No current facility-administered medications for this visit.    Allergies as of 04/09/2024 - Review Complete 04/09/2024  Allergen Reaction Noted   Cinnamon Anaphylaxis 09/29/2016   Hydrocodone  Other (See Comments) 09/06/2014   Naproxen Anaphylaxis and Other (See Comments)    Other Shortness Of Breath and Other (See Comments) 08/20/2011   Oxycodone  Other (See Comments) 09/06/2014   Feldene [piroxicam] Nausea Only 09/28/2016   Lyrica [pregabalin] Other (See Comments) 09/28/2016   Nsaids  10/22/2020   Phenergan [promethazine hcl] Other (See Comments) 08/04/2016   Sulfa antibiotics Other (See Comments) 04/06/2017   Latex Rash and Other (See Comments) 04/06/2017   Tape Rash and Other (See Comments) 08/14/2014    Social History   Socioeconomic History   Marital status: Married    Spouse name: Not on file   Number of children: Not on file   Years of education: Not on file   Highest education level: Not on file  Occupational History   Not on file  Tobacco Use   Smoking status: Never    Passive exposure: Never   Smokeless tobacco: Never  Vaping Use   Vaping status: Never Used  Substance and Sexual Activity   Alcohol use: No   Drug use: No   Sexual activity: Not on file  Other Topics Concern   Not  on file  Social History Narrative   ** Merged History Encounter **       Regularly exercises. Full Time.    Social Drivers of Corporate investment banker Strain: Low Risk  (06/29/2023)   Overall Financial Resource Strain (CARDIA)    Difficulty of Paying Living Expenses: Not very hard  Recent Concern: Financial Resource Strain - Medium Risk (06/29/2023)   Overall Financial Resource Strain (CARDIA)    Difficulty of Paying Living Expenses: Somewhat hard  Food Insecurity: Food Insecurity Present (08/30/2023)   Hunger Vital Sign  Worried About Programme researcher, broadcasting/film/video in the Last Year: Sometimes true    [employer] of Food in the Last Year: Sometimes true  Transportation Needs: No Transportation Needs (08/30/2023)   PRAPARE - Administrator, Civil Service (Medical): No    Lack of Transportation (Non-Medical): No  Physical Activity: Not on file  Stress: Not on file  Social Connections: Moderately Isolated (08/30/2023)   Social Connection and Isolation Panel [NHANES]    Frequency of Communication with Friends and Family: More than three times a week    Frequency of Social Gatherings with Friends and Family: More than three times a week    Attends Religious Services: Never    Database administrator or Organizations: No    Attends Engineer, structural: Never    Marital Status: Married    Review of systems General: negative for malaise, night sweats, fever, chills, weight loss Neck: Negative for lumps, goiter, pain and significant neck swelling Resp: Negative for cough, wheezing, dyspnea at rest CV: Negative for chest pain, leg swelling, palpitations, orthopnea GI: denies melena, hematochezia, nausea, vomiting, constipation, dysphagia, odyonophagia, early satiety or unintentional weight loss. +looser stools +GERD  MSK: Negative for joint pain or swelling, back pain, and muscle pain. Derm: Negative for itching or rash Psych: Denies depression, anxiety, memory loss, confusion. No  homicidal or suicidal ideation.  Heme: Negative for prolonged bleeding, bruising easily, and swollen nodes. Endocrine: Negative for cold or heat intolerance, polyuria, polydipsia and goiter. Neuro: negative for tremor, gait imbalance, syncope and seizures. The remainder of the review of systems is noncontributory.  Physical Exam: BP 115/72   Pulse 65   Temp 98 F (36.7 C)   Ht 4\' 9"  (1.448 m)   Wt 127 lb 8 oz (57.8 kg)   BMI 27.59 kg/m  General:   Alert and oriented. No distress noted. Pleasant and cooperative.  Head:  Normocephalic and atraumatic. Eyes:  Conjuctiva clear without scleral icterus. Mouth:  Oral mucosa pink and moist. Good dentition. No lesions. Heart: Normal rate and rhythm, s1 and s2 heart sounds present.  Lungs: Clear lung sounds in all lobes. Respirations equal and unlabored. Abdomen:  +BS, soft, non-tender and non-distended. No rebound or guarding. No HSM or masses noted. Derm: No palmar erythema or jaundice Msk:  Symmetrical without gross deformities. Normal posture. Extremities:  non pitting edema. Neurologic:  Alert and  oriented x4. No asterixis  Psych:  Alert and cooperative. Normal mood and affect.  Invalid input(s): "6 MONTHS"   ASSESSMENT: Kimberly [name] is a 68 y.o. female presenting today for follow up of CIrrhosis, GERD and IBS  Cirrhosis:appears well compensated. Previously with more ascites which seems improved on lasix  40mg /spironolactone  25mg  daily. Reports some fullness to abdomen and swelling to LEs that is dependent. No ascites noted on exam today. Denies episodes of confusion. EGD 06/28/23 without EVs.  She is due for [facility] screening via RUQ US  which is schedule for later this month and MELD labs/AFP. Last MELD 3.0 was 11.  GERD:Taking omeprazole  40mg  daily and famotidine  40mg  at bedtime, feels improvement but still with some reflux symptoms about 4 times per week. At this time recommend increasing PPI to BID dosing. Can use pepcid  PRN.   IBS:  6-7 looser stools per day, not currently taking dicyclomine  as she felt it made her drowsy and nauseated when she last took it. Could not tolerate xifaxan  in the past. Has been discussed in the past that her history of  duodenal switch is likely contributing to her symptoms, she was advised to have revision by bariatric surgeon but due to her cirrhosis we recommended against this. She can try taking bentyl  again as she seemed to tolerate this in the past. If she continues to have side effects she should stop it, we may consider levsin in place of this.    PLAN:  -will obtain labs from PCP (thinks just CBC) -need to update INR, AFP, CMP  -keep appt for RUQ US  later this month -increase omeprazole  40mg  to BID -can continue with famotidine  40mg  PRN -continue good reflux precautions  - Reduce salt intake to <2 g per day - Can take Tylenol  max of 2 g per day (650 mg q8h) for pain - Avoid NSAIDs for pain - Avoid eating raw oysters/shellfish - Ensure every night before going to sleep  All questions were answered, patient verbalized understanding and is in agreement with plan as outlined above.   Follow Up: 6 months   Seng Fouts L. Adrien Alberta, MSN, APRN, AGNP-C Adult-Gerontology Nurse Practitioner [facility]  I have reviewed the note and agree with the APP's assessment as described in this progress note  Samantha Cress, MD Gastroenterology and Hepatology [facility]

## 2024-04-09 NOTE — Patient Instructions (Addendum)
-  will obtain labs from PCP (thinks just CBC) -we will update liver labs  -keep appt for RUQ US  later this month -continue lasix  and spironolactone  at current dosages  -increase omeprazole  40mg  to twice daily, take one in the morning before breakfast and the other in the evening before dinner -can continue with famotidine  40mg  at bedtime as needed for reflux  -continue good reflux precautions, avoiding spicy, tomato/citrus based foods, avoid eating late in the evenings  -eat smaller meals, avoid larger, heavy meals  - Reduce salt intake to <2 g per day - Can take Tylenol  max of 2 g per day (650 mg q8h) for pain - Avoid NSAIDs for pain - Avoid eating raw oysters/shellfish - Ensure every night before going to sleep  Follow up 6 months

## 2024-04-10 ENCOUNTER — Encounter: Payer: Self-pay | Admitting: Cardiology

## 2024-04-10 ENCOUNTER — Ambulatory Visit: Admitting: Cardiology

## 2024-04-10 ENCOUNTER — Ambulatory Visit: Attending: Cardiology | Admitting: Cardiology

## 2024-04-10 VITALS — BP 122/72 | HR 78 | Ht 60.0 in | Wt 131.0 lb

## 2024-04-10 DIAGNOSIS — R0789 Other chest pain: Secondary | ICD-10-CM

## 2024-04-10 DIAGNOSIS — Z0001 Encounter for general adult medical examination with abnormal findings: Secondary | ICD-10-CM | POA: Diagnosis not present

## 2024-04-10 DIAGNOSIS — Z6823 Body mass index (BMI) 23.0-23.9, adult: Secondary | ICD-10-CM | POA: Diagnosis not present

## 2024-04-10 DIAGNOSIS — R0602 Shortness of breath: Secondary | ICD-10-CM | POA: Diagnosis not present

## 2024-04-10 DIAGNOSIS — R600 Localized edema: Secondary | ICD-10-CM | POA: Diagnosis not present

## 2024-04-10 DIAGNOSIS — Z1389 Encounter for screening for other disorder: Secondary | ICD-10-CM | POA: Diagnosis not present

## 2024-04-10 DIAGNOSIS — Z0000 Encounter for general adult medical examination without abnormal findings: Secondary | ICD-10-CM | POA: Diagnosis not present

## 2024-04-10 LAB — COMPREHENSIVE METABOLIC PANEL WITH GFR
AG Ratio: 1.6 (calc) (ref 1.0–2.5)
ALT: 26 U/L (ref 6–29)
AST: 43 U/L — ABNORMAL HIGH (ref 10–35)
Albumin: 4.1 g/dL (ref 3.6–5.1)
Alkaline phosphatase (APISO): 130 U/L (ref 37–153)
BUN/Creatinine Ratio: 19 (calc) (ref 6–22)
BUN: 20 mg/dL (ref 7–25)
CO2: 23 mmol/L (ref 20–32)
Calcium: 9.4 mg/dL (ref 8.6–10.4)
Chloride: 111 mmol/L — ABNORMAL HIGH (ref 98–110)
Creat: 1.07 mg/dL — ABNORMAL HIGH (ref 0.50–1.05)
Globulin: 2.5 g/dL (ref 1.9–3.7)
Glucose, Bld: 74 mg/dL (ref 65–99)
Potassium: 4.3 mmol/L (ref 3.5–5.3)
Sodium: 141 mmol/L (ref 135–146)
Total Bilirubin: 1.4 mg/dL — ABNORMAL HIGH (ref 0.2–1.2)
Total Protein: 6.6 g/dL (ref 6.1–8.1)
eGFR: 57 mL/min/{1.73_m2} — ABNORMAL LOW (ref >=60)

## 2024-04-10 LAB — PROTIME-INR
INR: 1
Prothrombin Time: 11 s (ref 9.0–11.5)

## 2024-04-10 LAB — AFP TUMOR MARKER: AFP-Tumor Marker: 3 ng/mL

## 2024-04-10 NOTE — Patient Instructions (Signed)
 Medication Instructions:  Your physician recommends that you continue on your current medications as directed. Please refer to the Current Medication list given to you today.  *If you need a refill on your cardiac medications before your next appointment, please call your pharmacy*  Lab Work: None If you have labs (blood work) drawn today and your tests are completely normal, you will receive your results only by: MyChart Message (if you have MyChart) OR A paper copy in the mail If you have any lab test that is abnormal or we need to change your treatment, we will call you to review the results.  Testing/Procedures: Your physician has requested that you have an echocardiogram. Echocardiography is a painless test that uses sound waves to create images of your heart. It provides your doctor with information about the size and shape of your heart and how well your heart's chambers and valves are working. This procedure takes approximately one hour. There are no restrictions for this procedure. Please do NOT wear cologne, perfume, aftershave, or lotions (deodorant is allowed). Please arrive 15 minutes prior to your appointment time.  Please note: We ask at that you not bring children with you during ultrasound (echo/ vascular) testing. Due to room size and safety concerns, children are not allowed in the ultrasound rooms during exams. Our front office staff cannot provide observation of children in our lobby area while testing is being conducted. An adult accompanying a patient to their appointment will only be allowed in the ultrasound room at the discretion of the ultrasound technician under special circumstances. We apologize for any inconvenience.   Follow-Up: At [facility], you and your health needs are our priority.  As part of our continuing mission to provide you with exceptional heart care, our providers are all part of one team.  This team includes your primary Cardiologist  (physician) and Advanced Practice Providers or APPs (Physician Assistants and Nurse Practitioners) who all work together to provide you with the care you need, when you need it.  Your next appointment:   6 month(s)  Provider:   You may see Dina Rich, MD or one of the following Advanced Practice Providers on your designated Care Team:   Randall An, PA-C  Scotesia Zephyr, [place] Jacolyn Reedy, [place]     We recommend signing up for the patient portal called "MyChart".  Sign up information is provided on this After Visit Summary.  MyChart is used to connect with patients for Virtual Visits (Telemedicine).  Patients are able to view lab/test results, encounter notes, upcoming appointments, etc.  Non-urgent messages can be sent to your provider as well.   To learn more about what you can do with MyChart, go to [email or web address].   Other Instructions

## 2024-04-10 NOTE — Progress Notes (Signed)
 Clinical Summary Kimberly [name] is a 68 y.o.female seen for follow up of the following medical problems.   1. Chest pain - history of false positive stress 04/2007, cath showed nonobstructive disease - negative stress echo 2012 - 06/2014 nuclear stress test no ischemia - 07/2016 echo: LVEF 55-60%, no WMAs  CAD risk factors: DM2 on insulin  now off since weight loss surgery, HTN, HL 07/2021 nuclear stress: no ischemia - can occur at rest or with activity. Mid to left chest, aching like pain 2/10 in severity. Inhaler can help pain. Lasts just a few seconds. Occurs roughly 2-3 times per day. Pain not as sharp or intense as her prior chronic symptoms.    2. Cirrhosis - followed by GI  3. COPD  4.LE edema/ SOB - some recent LE edema. Wears compression stockings - chronically SOB laying flat, sleeps on 2 pillows - exertion limited by chronic leg weakness, has had prior falls - taking lasix  20mg  bid, she is also on aldactone .    Past Medical History:  Diagnosis Date   Anemia    PMH: as a child   Arthritis    Asthma    CAD (coronary artery disease)    nonobstructive by cath, 6/08 (false positive Cardiolite ) normal stress echo, 7/11   CHF (congestive heart failure) (HCC)    Chronic back pain    Chronic bronchitis (HCC)    Cirrhosis (HCC)    Complication of anesthesia    had an asthma attack when woke up from procedure   Complication of anesthesia    asthma attack with one surgery   COPD (chronic obstructive pulmonary disease) (HCC)    Degenerative joint disease    Depression    DJD (degenerative joint disease)    Fatty liver    Fibromyalgia    GERD (gastroesophageal reflux disease)    Glaucoma    Headache(784.0)    Heart failure, diastolic, chronic (HCC)    Heart murmur    PMH:As a child only   Hernia of abdominal cavity    "upper and lower hernia"   History of hiatal hernia    HTN (hypertension)    Hypertension    Hypothyroidism    IBS (irritable bowel syndrome)     Morbid obesity (HCC)    Neuropathy    associated with diabetes   Obstructive sleep apnea    Pancreatitis    Pinched nerve in neck    Pneumonia    as a child   PONV (postoperative nausea and vomiting)    Pulmonary hypertension (HCC)    Rheumatic fever    PMH: as a child   Seizures (HCC)    PMH: only as a child   Seizures (HCC)    in childhood   Shortness of breath    Sleep apnea    Spinal stenosis    Stroke (HCC)    12/09/2013     Allergies  Allergen Reactions   Cinnamon Anaphylaxis    Denies allergy on 08/06/22, states "It was an interaction when mixed w/another med I no longer take"   Hydrocodone  Other (See Comments)    Over sedation   Naproxen Anaphylaxis and Other (See Comments)    Throat swelling   Other Shortness Of Breath and Other (See Comments)    Local anesthesia Had asthma attack  08/06/22 tolerated amide local anesthetic in peripheral nerve block.   Oxycodone  Other (See Comments)    Over sedation   Feldene [Piroxicam] Nausea Only   Lyrica [Pregabalin]  Other (See Comments)    Altered mental status for prolonged period   Nsaids     Avoid due to history of gastic bypass   Phenergan [Promethazine Hcl] Other (See Comments)    triggers asthma attacks   Sulfa Antibiotics Other (See Comments)    (Yeast infection, per patient.)   Latex Rash and Other (See Comments)    "Blistery" rash.    Tape Rash and Other (See Comments)    Do not use adhesive tape; okay to use paper tape. 08/06/22 tolerated adhesive tape for peripheral IV     Current Outpatient Medications  Medication Sig Dispense Refill   albuterol  (VENTOLIN  HFA) 108 (90 Base) MCG/ACT inhaler Inhale 2 puffs into the lungs 4 (four) times daily as needed for shortness of breath.     cyclobenzaprine  (FLEXERIL ) 5 MG tablet Take 1 tablet (5 mg total) by mouth every 8 (eight) hours as needed for muscle spasms. 20 tablet 0   denosumab  (PROLIA ) 60 MG/ML SOSY injection Inject 60 mg into the skin every 6 (six)  months. (Patient not taking: Reported on 04/09/2024)     dicyclomine  (BENTYL ) 10 MG capsule TAKE ONE CAPSULE BY MOUTH THREE TIMES DAILY AS NEEDED FOR SPASMS (Patient not taking: Reported on 04/09/2024) 90 capsule 2   famotidine  (PEPCID ) 40 MG tablet Take 40 mg by mouth daily.     furosemide  (LASIX ) 20 MG tablet Take 20 mg by mouth 2 (two) times daily.     levothyroxine  (SYNTHROID ) 112 MCG tablet Take 112 mcg by mouth daily before breakfast.     Multiple Vitamin (MULTIVITAMIN PO) Take 2 tablets by mouth daily. ADEK vitamin chewable     omeprazole  (PRILOSEC) 40 MG capsule Take 1 capsule (40 mg total) by mouth daily. 90 capsule 3   ondansetron  (ZOFRAN ) 4 MG tablet TAKE 1 TABLET BY MOUTH EVERY 8 HOURS AS NEEDED FOR NAUSEA OR VOMITING 30 tablet 1   PRESCRIPTION MEDICATION Injection in both eyes every 8 weeks for macular deg     PROCTO-MED HC  2.5 % rectal cream PLACE 1 APPLICATION RECTALLY TWICE DAILY 28 g 1   rifaximin  (XIFAXAN ) 550 MG TABS tablet Take 1 tablet (550 mg total) by mouth 3 (three) times daily. (Patient not taking: Reported on 04/09/2024) 42 tablet 0   spironolactone  (ALDACTONE ) 25 MG tablet Take 25 mg by mouth daily.     TRELEGY ELLIPTA 100-62.5-25 MCG/ACT AEPB Inhale 1 puff into the lungs daily.     zolpidem (AMBIEN) 5 MG tablet Take 5 mg by mouth at bedtime.     No current facility-administered medications for this visit.     Past Surgical History:  Procedure Laterality Date   ABDOMINAL HYSTERECTOMY     ABDOMINAL HYSTERECTOMY     partial hysterectomy   APPENDECTOMY     BACK SURGERY     BACK SURGERY     disc surgery with complications and damage to right side   BARIATRIC SURGERY     DS in June 2020   BILATERAL KNEE ARTHROSCOPY  2000, 2009   BIOPSY  06/16/2021   Procedure: BIOPSY;  Surgeon: Urban Garden, MD;  Location: [facility];  Service: Gastroenterology;;   BREAST CYST EXCISION Left    CARDIAC CATHETERIZATION     2009   CATARACT EXTRACTION W/ INTRAOCULAR  LENS  IMPLANT, BILATERAL     CHOLECYSTECTOMY  11/22/1992   CHOLECYSTECTOMY     COLONOSCOPY N/A 08/01/2013   Procedure: COLONOSCOPY;  Surgeon: Ruby Corporal, MD;  Location:  [facility];  Service: Endoscopy;  Laterality: N/A;  930   COLONOSCOPY N/A 08/03/2017   Procedure: COLONOSCOPY;  Surgeon: Ruby Corporal, MD;  Location: [facility];  Service: Endoscopy;  Laterality: N/A;  12:00   COLONOSCOPY WITH PROPOFOL  N/A 06/28/2023   Procedure: COLONOSCOPY WITH PROPOFOL ;  Surgeon: Urban Garden, MD;  Location: [facility];  Service: Gastroenterology;  Laterality: N/A;  12:30pm;asa 3   CYST EXCISION     Left breast   ESOPHAGOGASTRODUODENOSCOPY (EGD) WITH PROPOFOL  N/A 06/16/2021   Procedure: ESOPHAGOGASTRODUODENOSCOPY (EGD) WITH PROPOFOL ;  Surgeon: Urban Garden, MD;  Location: [facility];  Service: Gastroenterology;  Laterality: N/A;  12:50   ESOPHAGOGASTRODUODENOSCOPY (EGD) WITH PROPOFOL  N/A 06/28/2023   Procedure: ESOPHAGOGASTRODUODENOSCOPY (EGD) WITH PROPOFOL ;  Surgeon: Urban Garden, MD;  Location: [facility];  Service: Gastroenterology;  Laterality: N/A;  12:30pm;asa 3   EXCISIONAL HEMORRHOIDECTOMY     EYE SURGERY  11/22/1965   EYE SURGERY     bilateral cataract removal with lens implants   EYE SURGERY     growth removed from right eye lid   EYE SURGERY     bilateral eye surgery age 43 t"to straighten eyes'   heel (other)  11/23/2003   HEEL SPUR SURGERY     Right heel - growth removal and rebuilding of heel   HEMORRHOID SURGERY     HERNIA REPAIR     KNEE ARTHROSCOPY Bilateral    LUMBAR LAMINECTOMY/DECOMPRESSION MICRODISCECTOMY Right 08/23/2014   Procedure: LUMBAR LAMINECTOMY/DECOMPRESSION MICRODISCECTOMY 1 LEVEL;  Surgeon: Baruch Bosch, MD;  Location: [facility];  Service: Neurosurgery;  Laterality: Right;  LUMBAR LAMINECTOMY/DECOMPRESSION MICRODISCECTOMY 1 LEVEL LUMBAR 2-3   ovaries removed  11/22/2001   PARTIAL HYSTERECTOMY  11/23/1979    SHOULDER ARTHROSCOPY WITH DISTAL CLAVICLE RESECTION Left 08/06/2022   Procedure: SHOULDER ARTHROSCOPY WITH DISTAL CLAVICLE RESECTION;  Surgeon: Janeth Medicus, MD;  Location: [facility] OR;  Service: Orthopedics;  Laterality: Left;   SHOULDER ARTHROSCOPY WITH ROTATOR CUFF REPAIR Left 08/06/2022   Procedure: SHOULDER ARTHROSCOPY WITH ROTATOR CUFF REPAIR;  Surgeon: Janeth Medicus, MD;  Location[facility];  Service: Orthopedics;  Laterality: Left;   SHOULDER ARTHROSCOPY WITH SUBACROMIAL DECOMPRESSION Left 08/06/2022   Procedure: SHOULDER ARTHROSCOPY WITH SUBACROMIAL DECOMPRESSIONWITH EXTENSIVE DEBRIDEMENT;  Surgeon: Janeth Medicus, MD;  Location: [facility];  Service: Orthopedics;  Laterality: Left;   SPHINCTEROTOMY     spinahatomy  11/22/1990   TOTAL KNEE ARTHROPLASTY Right 11/05/2020   Procedure: RIGHT TOTAL KNEE ARTHROPLASTY;  Surgeon: Adah Acron, MD;  Location: [facility];  Service: Orthopedics;  Laterality: Right;   TUBAL LIGATION  11/22/1973   TUBAL LIGATION       Allergies  Allergen Reactions   Cinnamon Anaphylaxis    Denies allergy on 08/06/22, states "It was an interaction when mixed w/another med I no longer take"   Hydrocodone  Other (See Comments)    Over sedation   Naproxen Anaphylaxis and Other (See Comments)    Throat swelling   Other Shortness Of Breath and Other (See Comments)    Local anesthesia Had asthma attack  08/06/22 tolerated amide local anesthetic in peripheral nerve block.   Oxycodone  Other (See Comments)    Over sedation   Feldene [Piroxicam] Nausea Only   Lyrica [Pregabalin] Other (See Comments)    Altered mental status for prolonged period   Nsaids     Avoid due to history of gastic bypass   Phenergan [Promethazine Hcl] Other (See Comments)  triggers asthma attacks   Sulfa Antibiotics Other (See Comments)    (Yeast infection, per patient.)   Latex Rash and Other (See Comments)    "Blistery" rash.    Tape Rash and Other (See Comments)    Do not  use adhesive tape; okay to use paper tape. 08/06/22 tolerated adhesive tape for peripheral IV      Family History  Problem Relation Age of Onset   Colon cancer Brother    Cancer Other    Heart failure Other    Breast cancer Neg Hx      Social History Kimberly [name] reports that she has never smoked. She has never been exposed to tobacco smoke. She has never used smokeless tobacco. Kimberly [name] reports no history of alcohol use.   Physical Examination Today's Vitals   04/10/24 1436  BP: 122/72  Pulse: 78  SpO2: 95%  Weight: 131 lb (59.4 kg)  Height: 5' (1.524 m)   Body mass index is 25.58 kg/m.  Gen: resting comfortably, no acute distress HEENT: no scleral icterus, pupils equal round and reactive, no palptable cervical adenopathy,  CV: RRR, no m/rg, no jvd Resp: Clear to auscultation bilaterally GI: abdomen is soft, non-tender, non-distended, normal bowel sounds, no hepatosplenomegaly MSK: extremities are warm, trace bilateral edema Skin: warm, no rash Neuro:  no focal deficits Psych: appropriate affect   Diagnostic Studies  04/2007 cath HEMODYNAMIC RESULTS:  Aorta 141/77 mmHg.  Left ventricle 141/16 mmHg.    ANGIOGRAPHIC FINDINGS:  1. Left main coronary artery is medium in caliber and gives rise to      left anterior descending, ramus intermedius and circumflex vessels.      There is no significant flow-limiting coronary atherosclerosis      noted.  2. The left anterior descending is a medium caliber vessel that tapers      towards the apex.  This vessel gives rise to a fairly large      proximal diagonal Cosby Proby and a smaller distal diagonal Claramae Rigdon.      Flow was TIMI III throughout the vessel and is no significant flow-      limiting coronary atherosclerosis noted.  3. There is a small to medium caliber ramus intermedius without      significant flow-limiting coronary atherosclerosis.  4. The circumflex coronary artery is a medium to large caliber vessel       with four obtuse marginal branches, the third of which is the      largest and bifurcates.  There are minor luminal irregularities      without flow-limiting coronary atherosclerosis.  5. The right coronary artery is a medium caliber dominant vessel.      There are minor luminal irregularities without significant flow-      limiting coronary sclerosis.  6. Left ventriculography was performed in the RAO projection and      revealed ejection fraction of approximately 55% without significant      wall motion abnormality or significant mitral regurgitation.    DIAGNOSES:  1. No significant flow-limiting coronary atherosclerosis noted within      major epicardial vessels.  There is some distal tapering of the      left anterior descending, although flow is TIMI III.  2. Left ventricular ejection fraction approximately 55% without      significant wall motion abnormality.  No significant mitral      regurgitation.  The left ventricle end-diastolic pressure of 16      mmHg.  DISCUSSION:  I reviewed results with the patient and with Dr. Valaria Garland by  phone.  At this point will plan overnight observation.  Will follow up a  full set of cardiac markers as well as her electrocardiogram.  Anticipate a contrasted CT scan of the chest tomorrow to exclude  pulmonary embolus, dissection and mass.  Continue proton pump inhibitor.  If her CT scan of the chest is reassuring, may be able to continue  medical therapy with plans for risk factor modification.  One wonders  about the possibility of microvascular angina.  Further plans to follow.   07/2021 nuclear stress The study is low risk.   No ST deviation was noted.   LV perfusion is normal.   Left ventricular function is normal. Nuclear stress EF: 66 %.   Patient unable to navigate treadmill due to knee pain, stress modality was switched to Lexiscan .  No significant perfusion defects on stress imaging in the setting of breast attenuation and LVEF  normal at 66%.  Low risk study.    Assessment and Plan   1. Chest pain - long history of chest pain with negative ischemic evaluations.  - chronic symptoms are noncardiac in description, have decreased in general since last visit - continue to monitor   2. LE edema/SOB - will repeat echo. 2017 echo was benign. Unclear if her edema is solely related to her cirrhosis or if there is cardiac dysfunction - continue lasix        Laurann Pollock, M.D

## 2024-04-11 ENCOUNTER — Ambulatory Visit (INDEPENDENT_AMBULATORY_CARE_PROVIDER_SITE_OTHER): Payer: Self-pay | Admitting: Gastroenterology

## 2024-04-13 ENCOUNTER — Inpatient Hospital Stay

## 2024-04-13 ENCOUNTER — Inpatient Hospital Stay: Attending: Oncology | Admitting: Oncology

## 2024-04-13 VITALS — BP 131/85 | HR 59 | Temp 98.1°F | Resp 18 | Wt 127.8 lb

## 2024-04-13 DIAGNOSIS — D479 Neoplasm of uncertain behavior of lymphoid, hematopoietic and related tissue, unspecified: Secondary | ICD-10-CM | POA: Diagnosis not present

## 2024-04-13 DIAGNOSIS — D61818 Other pancytopenia: Secondary | ICD-10-CM | POA: Diagnosis not present

## 2024-04-13 DIAGNOSIS — K7581 Nonalcoholic steatohepatitis (NASH): Secondary | ICD-10-CM | POA: Insufficient documentation

## 2024-04-13 DIAGNOSIS — K7460 Unspecified cirrhosis of liver: Secondary | ICD-10-CM | POA: Diagnosis not present

## 2024-04-13 DIAGNOSIS — G2581 Restless legs syndrome: Secondary | ICD-10-CM | POA: Insufficient documentation

## 2024-04-13 LAB — COMPREHENSIVE METABOLIC PANEL WITH GFR
ALT: 30 U/L (ref 0–44)
AST: 45 U/L — ABNORMAL HIGH (ref 15–41)
Albumin: 3.9 g/dL (ref 3.5–5.0)
Alkaline Phosphatase: 120 U/L (ref 38–126)
Anion gap: 6 (ref 5–15)
BUN: 17 mg/dL (ref 8–23)
CO2: 20 mmol/L — ABNORMAL LOW (ref 22–32)
Calcium: 8.9 mg/dL (ref 8.9–10.3)
Chloride: 111 mmol/L (ref 98–111)
Creatinine, Ser: 1.1 mg/dL — ABNORMAL HIGH (ref 0.44–1.00)
GFR, Estimated: 55 mL/min — ABNORMAL LOW (ref >60)
Glucose, Bld: 86 mg/dL (ref 70–99)
Potassium: 3.6 mmol/L (ref 3.5–5.1)
Sodium: 137 mmol/L (ref 135–145)
Total Bilirubin: 1.6 mg/dL — ABNORMAL HIGH (ref 0.0–1.2)
Total Protein: 6.7 g/dL (ref 6.5–8.1)

## 2024-04-13 LAB — CBC WITH DIFFERENTIAL/PLATELET
Abs Immature Granulocytes: 0.01 10*3/uL (ref 0.00–0.07)
Basophils Absolute: 0 10*3/uL (ref 0.0–0.1)
Basophils Relative: 1 %
Eosinophils Absolute: 0.1 10*3/uL (ref 0.0–0.5)
Eosinophils Relative: 2 %
HCT: 35.2 % — ABNORMAL LOW (ref 36.0–46.0)
Hemoglobin: 12.6 g/dL (ref 12.0–15.0)
Immature Granulocytes: 0 %
Lymphocytes Relative: 36 %
Lymphs Abs: 1.7 10*3/uL (ref 0.7–4.0)
MCH: 35.6 pg — ABNORMAL HIGH (ref 26.0–34.0)
MCHC: 35.8 g/dL (ref 30.0–36.0)
MCV: 99.4 fL (ref 80.0–100.0)
Monocytes Absolute: 0.4 10*3/uL (ref 0.1–1.0)
Monocytes Relative: 8 %
Neutro Abs: 2.5 10*3/uL (ref 1.7–7.7)
Neutrophils Relative %: 53 %
Platelets: 143 10*3/uL — ABNORMAL LOW (ref 150–400)
RBC: 3.54 MIL/uL — ABNORMAL LOW (ref 3.87–5.11)
RDW: 13.7 % (ref 11.5–15.5)
WBC: 4.7 10*3/uL (ref 4.0–10.5)
nRBC: 0 % (ref 0.0–0.2)

## 2024-04-13 LAB — IRON AND TIBC
Iron: 119 ug/dL (ref 28–170)
Saturation Ratios: 29 % (ref 10.4–31.8)
TIBC: 409 ug/dL (ref 250–450)
UIBC: 290 ug/dL

## 2024-04-13 LAB — FERRITIN: Ferritin: 126 ng/mL (ref 11–307)

## 2024-04-13 LAB — FOLATE: Folate: 11.5 ng/mL (ref >5.9)

## 2024-04-13 LAB — VITAMIN B12: Vitamin B-12: 291 pg/mL (ref 180–914)

## 2024-04-13 LAB — LACTATE DEHYDROGENASE: LDH: 150 U/L (ref 98–192)

## 2024-04-13 LAB — URIC ACID: Uric Acid, Serum: 6 mg/dL (ref 2.5–7.1)

## 2024-04-13 NOTE — Patient Instructions (Signed)
 VISIT SUMMARY:  Today, you were seen for an evaluation of your low white blood cell count and low platelets, which are related to your liver cirrhosis. We discussed your ongoing symptoms, including fatigue, leg pain, and cramping, as well as your difficulties with eating and incontinence following your weight loss surgery.  YOUR PLAN:  -LIVER CIRRHOSIS: Liver cirrhosis is a condition where the liver is severely scarred and its function is impaired. Your cirrhosis is due to fatty liver disease and is causing low white blood cell and platelet counts. We will monitor your liver function and check for liver cancer regularly. Blood work has been ordered to assess your liver function and rule out other causes of your blood abnormalities. We will also review the results of your liver ultrasound scheduled for Apr 19, 2024.  -ANEMIA DUE TO LIVER CIRRHOSIS: Anemia is a condition where you don't have enough healthy red blood cells to carry adequate oxygen  to your body's tissues. Your anemia is likely due to your liver cirrhosis and is contributing to your fatigue and possibly your leg cramps. We will check your iron, vitamin B12, folate, and copper  levels, and perform a flow cytometry test to rule out lymphoma.  -NUMBNESS AND CRAMPING IN LEGS: The numbness and cramping in your legs may be related to your anemia or nutritional deficiencies. We will evaluate your iron levels as part of the anemia workup to see if this is contributing to your symptoms.  INSTRUCTIONS:  Please complete the blood work as ordered and attend your liver ultrasound on Apr 19, 2024. Schedule a follow-up appointment in two weeks to discuss the results of your tests and plan further management.

## 2024-04-13 NOTE — Assessment & Plan Note (Signed)
 Patient has a history of liver cirrhosis secondary to NASH Being followed by GI AFP normal.  Abdominal ultrasound is scheduled for 04/19/2024  - Continue to follow with GI

## 2024-04-13 NOTE — Progress Notes (Signed)
 [facility] at [facility]  HEMATOLOGY NEW VISIT  Kimberly Pollard, MD  REASON FOR REFERRAL: Pancytopenia   HISTORY OF PRESENT ILLNESS: Kimberly [name] 68 y.o. female referred for pancytopenia.  Patient has a past medical history of liver cirrhosis secondary to NASH, CHF, coronary artery disease, IBS, obesity s/p duodenal switch.  She experiences significant fatigue and pain, particularly in her legs, which affects her mobility. She underwent a total knee replacement on the right leg, while the left leg has not been operated on yet. Both legs cramp and hurt severely, with the right leg remaining numb from the knee to the toes. The cramping starts in the toes and ankles, progressing upwards, and is described as 'really, really bad'.  In 2020, she underwent a duodenal switch weight loss surgery, which adversely affected her cirrhosis. Since the surgery, she has struggled with eating, experiencing frequent vomiting and diarrhea. She also reports a lack of control over her bladder and bowels, necessitating the use of incontinence products. She reports occasional fevers and chills but does not measure her temperature. No night sweats or significant weight loss, but she notes difficulty eating.   She does not smoke or consume alcohol.  I have reviewed the past medical history, past surgical history, social history and family history with the patient   ALLERGIES:  is allergic to cinnamon, hydrocodone , naproxen, other, oxycodone , feldene [piroxicam], lyrica [pregabalin], nsaids, phenergan [promethazine hcl], sulfa antibiotics, latex, and tape.  MEDICATIONS:  Current Outpatient Medications  Medication Sig Dispense Refill   albuterol  (VENTOLIN  HFA) 108 (90 Base) MCG/ACT inhaler Inhale 2 puffs into the lungs 4 (four) times daily as needed for shortness of breath.     cyclobenzaprine  (FLEXERIL ) 10 MG tablet Take 10 mg by mouth every 8 (eight) hours as needed.     denosumab   (PROLIA ) 60 MG/ML SOSY injection Inject 60 mg into the skin every 6 (six) months.     dicyclomine  (BENTYL ) 10 MG capsule TAKE ONE CAPSULE BY MOUTH THREE TIMES DAILY AS NEEDED FOR SPASMS 90 capsule 2   famotidine  (PEPCID ) 40 MG tablet Take 40 mg by mouth daily.     furosemide  (LASIX ) 20 MG tablet Take 20 mg by mouth 2 (two) times daily.     levothyroxine  (SYNTHROID ) 125 MCG tablet Take 125 mcg by mouth daily.     Multiple Vitamin (MULTIVITAMIN PO) Take 2 tablets by mouth daily. ADEK vitamin chewable     omeprazole  (PRILOSEC) 40 MG capsule Take 1 capsule (40 mg total) by mouth daily. 90 capsule 3   ondansetron  (ZOFRAN ) 4 MG tablet TAKE 1 TABLET BY MOUTH EVERY 8 HOURS AS NEEDED FOR NAUSEA OR VOMITING 30 tablet 1   potassium chloride  (MICRO-K ) 10 MEQ CR capsule Take 10 mEq by mouth 2 (two) times daily.     PROCTO-MED HC  2.5 % rectal cream PLACE 1 APPLICATION RECTALLY TWICE DAILY 28 g 1   rifaximin  (XIFAXAN ) 550 MG TABS tablet Take 1 tablet (550 mg total) by mouth 3 (three) times daily. 42 tablet 0   spironolactone  (ALDACTONE ) 25 MG tablet Take 25 mg by mouth daily.     TRELEGY ELLIPTA 100-62.5-25 MCG/ACT AEPB Inhale 1 puff into the lungs daily.     triamcinolone  cream (KENALOG) 0.1 % Apply topically.     zolpidem (AMBIEN) 5 MG tablet Take 5 mg by mouth at bedtime.     No current facility-administered medications for this visit.     REVIEW OF SYSTEMS:   Constitutional:  Denies fevers, chills or night sweats Eyes: Denies blurriness of vision Ears, nose, mouth, throat, and face: Denies mucositis or sore throat Respiratory: Denies cough, dyspnea or wheezes Cardiovascular: Denies palpitation, chest discomfort or lower extremity swelling Gastrointestinal:  Denies nausea, heartburn or change in bowel habits Skin: Denies abnormal skin rashes Lymphatics: Denies new lymphadenopathy or easy bruising Neurological:Denies numbness, tingling or new weaknesses Behavioral/Psych: Mood is stable, no new changes   All other systems were reviewed with the patient and are negative.  PHYSICAL EXAMINATION:   Vitals:   04/13/24 1028  BP: 131/85  Pulse: (!) 59  Resp: 18  Temp: 98.1 F (36.7 C)  SpO2: 98%    GENERAL:alert, no distress and comfortable SKIN: skin color, texture, turgor are normal, no rashes or significant lesions LYMPH:  no palpable lymphadenopathy in the cervical, axillary or inguinal LUNGS: clear to auscultation and percussion with normal breathing effort HEART: regular rate & rhythm and no murmurs and no lower extremity edema ABDOMEN:abdomen soft, non-tender and normal bowel sounds Musculoskeletal:no cyanosis of digits and no clubbing  NEURO: alert & oriented x 3 with fluent speech  LABORATORY DATA:  I have reviewed the data as listed and labs from primary care drawn on 03/29/2024 CBC: WBC: 2.7, hemoglobin: 11.5, hematocrit: 32.4, MCV: 97.1, platelets: 115  Lab Results  Component Value Date   WBC 4.7 04/13/2024   NEUTROABS 2.5 04/13/2024   HGB 12.6 04/13/2024   HCT 35.2 (L) 04/13/2024   MCV 99.4 04/13/2024   PLT 143 (L) 04/13/2024      Chemistry      Component Value Date/Time   NA 137 04/13/2024 1049   K 3.6 04/13/2024 1049   CL 111 04/13/2024 1049   CO2 20 (L) 04/13/2024 1049   BUN 17 04/13/2024 1049   CREATININE 1.10 (H) 04/13/2024 1049   CREATININE 1.07 (H) 04/09/2024 1439      Component Value Date/Time   CALCIUM  8.9 04/13/2024 1049   ALKPHOS 120 04/13/2024 1049   AST 45 (H) 04/13/2024 1049   ALT 30 04/13/2024 1049   BILITOT 1.6 (H) 04/13/2024 1049     Last Colonoscopy:06/28/23   - The examined portion of the ileum was normal.  - The entire examined colon is normal.  - The distal rectum and anal verge are normal on retroflexion view.  - No specimens collected.   Last Endoscopy:06/28/23   - Normal esophagus.  - A duodenal switch and sleeve gastrectomy was found, characterized by healthy appearing mucosa.  - Normal examined duodenum.  - No specimens  collected.  RADIOGRAPHIC STUDIES: I have personally reviewed the radiological images as listed and agreed with the findings in the report.  EXAM: CT CHEST, ABDOMEN, AND PELVIS WITH CONTRAST   TECHNIQUE: Multidetector CT imaging of the chest, abdomen and pelvis was performed following the standard protocol during bolus administration of intravenous contrast.   RADIATION DOSE REDUCTION: This exam was performed according to the departmental dose-optimization program which includes automated exposure control, adjustment of the mA and/or kV according to patient size and/or use of iterative reconstruction technique.   CONTRAST:  OMNIPAQUE  IOHEXOL  300 MG/ML  SOLN   COMPARISON:  CT abdomen and pelvis 05/31/2022   FINDINGS: CT CHEST FINDINGS   Cardiovascular: No significant vascular findings. Normal heart size. No pericardial effusion.   Mediastinum/Nodes: No enlarged mediastinal, hilar, or axillary lymph nodes. Thyroid  gland, trachea, and esophagus demonstrate no significant findings. There is a small hiatal hernia.   Lungs/Pleura: There is a 4 mm  right lower lobe nodule image 4/92. There some minimal peripheral reticular opacities in the right lower lobe. The lungs are otherwise clear. No pleural effusion or pneumothorax. Trachea and central airways are patent.   Musculoskeletal: There are healed right anterior second through fifth rib fractures. No acute fractures are identified. Mild chronic compression deformity of T12 is unchanged from 2023.   CT ABDOMEN PELVIS FINDINGS   Hepatobiliary: No focal liver abnormality is seen. Status post cholecystectomy. No biliary dilatation.   Pancreas: Unremarkable. No pancreatic ductal dilatation or surrounding inflammatory changes.   Spleen: Normal in size without focal abnormality.   Adrenals/Urinary Tract: There is a subcentimeter cyst in the right kidney. Otherwise, the kidneys, adrenal glands, and bladder are within normal  limits.   Stomach/Bowel: There are postsurgical changes in the stomach. Appendix is not seen. No evidence of bowel wall thickening, distention, or inflammatory changes.   Vascular/Lymphatic: Aorta and IVC are normal in size. Splenic varices are present as well as splenorenal shunt. This appears unchanged from 2023. No enlarged lymph nodes are identified.   Reproductive: Status post hysterectomy. No adnexal masses.   Other: Mild subcutaneous edema in the anterior left abdominal wall. No focal hematoma or abdominal wall fluid collection. No ascites.   Musculoskeletal: Mild chronic compression deformity of L4 appears unchanged. No acute fractures are identified.   IMPRESSION: 1. Mild subcutaneous edema in the anterior left abdominal wall compatible with soft tissue contusion. No focal hematoma or fluid collection. 2. No other acute posttraumatic sequelae in the chest, abdomen or pelvis. 3. 4 mm right solid pulmonary nodule. No follow-up needed if patient is low-risk.This recommendation follows the consensus statement: Guidelines for Management of Incidental Pulmonary Nodules Detected on CT Images: From the Fleischner Society 2017; Radiology 2017; 284:228-243. 4. Stable splenic varices and splenorenal shunt. 5. Stable mild chronic compression deformities of T12 and L4. 6. There some minimal peripheral reticular opacities in the right lower lobe. 7. Subcentimeter right Bosniak I benign renal cyst. No follow-up imaging is recommended. JACR 2018 Feb; 264-273, Management of the Incidental Renal Mass on CT, RadioGraphics 2021; 814-848, Bosniak Classification of Cystic Renal Masses, Version 2019.     Electronically Signed   By: Tyron Gallon M.D.   On: 11/26/2023 18:51  ASSESSMENT & PLAN:  Patient is a 68 y.o. female referred for pancytopenia  Pancytopenia (HCC) Pancytopenia likely secondary to liver cirrhosis Patient had a history of leukopenia and thrombocytopenia for a while  now. Also reports restless leg syndrome  - Will workup for pancytopenia including nutritional workup-iron, vitamin B12, folate and copper  levels - Will order flow cytometry to rule out lymphoma/leukemia - Will obtain LDH and uric acid  Return to clinic in 2 weeks to discuss results  Liver cirrhosis secondary to NASH [facility]) Patient has a history of liver cirrhosis secondary to NASH Being followed by GI AFP normal.  Abdominal ultrasound is scheduled for 04/19/2024  - Continue to follow with GI   Orders Placed This Encounter  Procedures   Ferritin    Standing Status:   Future    Number of Occurrences:   1    Expected Date:   04/13/2024    Expiration Date:   04/13/2025   Folate    Standing Status:   Future    Number of Occurrences:   1    Expected Date:   04/13/2024    Expiration Date:   04/13/2025   Vitamin B12    Standing Status:   Future  Number of Occurrences:   1    Expected Date:   04/13/2024    Expiration Date:   04/13/2025   CBC with Differential/Platelet    Standing Status:   Future    Number of Occurrences:   1    Expected Date:   04/13/2024    Expiration Date:   04/13/2025   Comprehensive metabolic panel with GFR    Standing Status:   Future    Number of Occurrences:   1    Expected Date:   04/13/2024    Expiration Date:   04/13/2025   Iron and TIBC    Standing Status:   Future    Number of Occurrences:   1    Expected Date:   04/13/2024    Expiration Date:   04/13/2025   Lactate dehydrogenase    Standing Status:   Future    Number of Occurrences:   1    Expected Date:   04/13/2024    Expiration Date:   04/13/2025   Uric acid    Standing Status:   Future    Number of Occurrences:   1    Expected Date:   04/13/2024    Expiration Date:   04/13/2025   Copper , serum    Standing Status:   Future    Number of Occurrences:   1    Expected Date:   04/13/2024    Expiration Date:   04/13/2025   Flow Cytometry, Peripheral Blood (Oncology)    Standing Status:   Future     Number of Occurrences:   1    Expected Date:   04/13/2024    Expiration Date:   04/13/2025    The total time spent in the appointment was 40 minutes encounter with patients including review of chart and various tests results, discussions about plan of care and coordination of care plan   All questions were answered. The patient knows to call the clinic with any problems, questions or concerns. No barriers to learning was detected.   Eduardo Grade, MD 5/23/20251:28 PM

## 2024-04-13 NOTE — Assessment & Plan Note (Signed)
 Pancytopenia likely secondary to liver cirrhosis Patient had a history of leukopenia and thrombocytopenia for a while now. Also reports restless leg syndrome  - Will workup for pancytopenia including nutritional workup-iron, vitamin B12, folate and copper  levels - Will order flow cytometry to rule out lymphoma/leukemia - Will obtain LDH and uric acid  Return to clinic in 2 weeks to discuss results

## 2024-04-15 LAB — COPPER, SERUM: Copper: 35 ug/dL — ABNORMAL LOW (ref 80–158)

## 2024-04-17 ENCOUNTER — Ambulatory Visit: Payer: Self-pay | Admitting: *Deleted

## 2024-04-17 ENCOUNTER — Other Ambulatory Visit: Payer: Self-pay | Admitting: Oncology

## 2024-04-17 MED ORDER — COPPER 2 MG PO TABS: 2.0000 mg | Freq: Every day | 0 refills | Status: DC

## 2024-04-17 NOTE — Progress Notes (Signed)
 Patient made aware and verbalized understanding.

## 2024-04-18 DIAGNOSIS — R079 Chest pain, unspecified: Secondary | ICD-10-CM | POA: Diagnosis not present

## 2024-04-18 DIAGNOSIS — R4582 Worries: Secondary | ICD-10-CM | POA: Diagnosis not present

## 2024-04-18 DIAGNOSIS — K7460 Unspecified cirrhosis of liver: Secondary | ICD-10-CM | POA: Diagnosis not present

## 2024-04-18 DIAGNOSIS — H3530 Unspecified macular degeneration: Secondary | ICD-10-CM | POA: Diagnosis not present

## 2024-04-18 DIAGNOSIS — Z0001 Encounter for general adult medical examination with abnormal findings: Secondary | ICD-10-CM | POA: Diagnosis not present

## 2024-04-18 DIAGNOSIS — Z6823 Body mass index (BMI) 23.0-23.9, adult: Secondary | ICD-10-CM | POA: Diagnosis not present

## 2024-04-18 DIAGNOSIS — Z23 Encounter for immunization: Secondary | ICD-10-CM | POA: Diagnosis not present

## 2024-04-18 LAB — SURGICAL PATHOLOGY

## 2024-04-19 ENCOUNTER — Ambulatory Visit (HOSPITAL_COMMUNITY)
Admission: RE | Admit: 2024-04-19 | Discharge: 2024-04-19 | Disposition: A | Discharge status: Home / Self Care | Source: Ambulatory Visit | Attending: Gastroenterology | Admitting: Gastroenterology

## 2024-04-19 ENCOUNTER — Ambulatory Visit (INDEPENDENT_AMBULATORY_CARE_PROVIDER_SITE_OTHER): Payer: Self-pay | Admitting: Gastroenterology

## 2024-04-19 ENCOUNTER — Other Ambulatory Visit: Payer: Self-pay | Admitting: *Deleted

## 2024-04-19 DIAGNOSIS — K7460 Unspecified cirrhosis of liver: Secondary | ICD-10-CM | POA: Insufficient documentation

## 2024-04-19 DIAGNOSIS — K7581 Nonalcoholic steatohepatitis (NASH): Secondary | ICD-10-CM | POA: Diagnosis not present

## 2024-04-19 DIAGNOSIS — K7689 Other specified diseases of liver: Secondary | ICD-10-CM | POA: Diagnosis not present

## 2024-04-19 DIAGNOSIS — Z9049 Acquired absence of other specified parts of digestive tract: Secondary | ICD-10-CM | POA: Diagnosis not present

## 2024-04-19 MED ORDER — COPPER 2 MG PO TABS: 2.0000 mg | Freq: Every day | 0 refills | Status: DC

## 2024-04-23 DIAGNOSIS — J449 Chronic obstructive pulmonary disease, unspecified: Secondary | ICD-10-CM | POA: Diagnosis not present

## 2024-04-23 LAB — FLOW CYTOMETRY

## 2024-04-24 DIAGNOSIS — M79605 Pain in left leg: Secondary | ICD-10-CM | POA: Diagnosis not present

## 2024-04-24 DIAGNOSIS — M79604 Pain in right leg: Secondary | ICD-10-CM | POA: Diagnosis not present

## 2024-04-26 DIAGNOSIS — M810 Age-related osteoporosis without current pathological fracture: Secondary | ICD-10-CM | POA: Diagnosis not present

## 2024-04-27 ENCOUNTER — Ambulatory Visit: Admitting: Oncology

## 2024-04-30 ENCOUNTER — Inpatient Hospital Stay: Attending: Oncology | Admitting: Oncology

## 2024-04-30 VITALS — BP 115/75 | HR 60 | Temp 98.2°F | Resp 18 | Wt 132.0 lb

## 2024-04-30 DIAGNOSIS — E610 Copper deficiency: Secondary | ICD-10-CM | POA: Diagnosis not present

## 2024-04-30 DIAGNOSIS — E538 Deficiency of other specified B group vitamins: Secondary | ICD-10-CM | POA: Diagnosis not present

## 2024-04-30 DIAGNOSIS — K7460 Unspecified cirrhosis of liver: Secondary | ICD-10-CM | POA: Diagnosis not present

## 2024-04-30 DIAGNOSIS — D696 Thrombocytopenia, unspecified: Secondary | ICD-10-CM | POA: Diagnosis not present

## 2024-04-30 DIAGNOSIS — D61818 Other pancytopenia: Secondary | ICD-10-CM | POA: Diagnosis not present

## 2024-04-30 DIAGNOSIS — K7581 Nonalcoholic steatohepatitis (NASH): Secondary | ICD-10-CM

## 2024-04-30 NOTE — Assessment & Plan Note (Signed)
 Patient has mild vitamin B12 deficiency with levels less than 400  - Continue oral vitamin B12 1000 mcg daily

## 2024-04-30 NOTE — Assessment & Plan Note (Signed)
 Likely secondary to nutritional deficiency and cirrhosis Resolved except thrombocytopenia at the time of repeat labs  -Continue to monitor for now - Continue to take copper  supplementation

## 2024-04-30 NOTE — Assessment & Plan Note (Signed)
 Patient has mild thrombocytopenia likely secondary to cirrhosis and copper  deficiency  - Continue to follow with GI - Continue to take copper  supplementation  Return to clinic in 3 months with labs

## 2024-04-30 NOTE — Assessment & Plan Note (Signed)
 Patient has a history of liver cirrhosis secondary to NASH Being followed by GI AFP normal.   Abdominal ultrasound 04/19/2024: Consistent with liver cirrhosis  - Continue to follow with GI

## 2024-04-30 NOTE — Assessment & Plan Note (Signed)
 Recent labs with a level of 35.  -Continue copper  2 mg daily  Repeat labs in 3 months

## 2024-04-30 NOTE — Patient Instructions (Signed)
 VISIT SUMMARY:  Today, we reviewed your liver cirrhosis, low copper  levels, and blood cell counts. Your liver condition is stable, and your blood work shows improvement in your platelet count and resolution of anemia. We also discussed your persistent leg pain and swelling, as well as your skin issues.  YOUR PLAN:  -LIVER CIRRHOSIS: Liver cirrhosis is a condition where the liver is scarred and permanently damaged. Your liver condition is stable, and your platelet count has improved. We will reassess your condition in three months.   -COPPER  DEFICIENCY: Copper  deficiency can lead to low blood cell counts. You should continue taking your copper  supplements as prescribed. We will reassess your blood cell counts in three months.   INSTRUCTIONS:  Please continue taking your copper  supplements and vitamin D as prescribed. We will reassess your liver condition and blood cell counts in three months. If you experience any new or worsening symptoms, please contact our office.

## 2024-04-30 NOTE — Progress Notes (Signed)
 [facility] at [facility]  HEMATOLOGY FOLLOW-UP VISIT  [name], MD  REASON FOR FOLLOW-UP: Thrombocytopenia  ASSESSMENT & PLAN:  Patient is a 68 y.o. female following for thrombocytopenia  Thrombocytopenia (HCC) Patient has mild thrombocytopenia likely secondary to cirrhosis and copper  deficiency  - Continue to follow with GI - Continue to take copper  supplementation  Return to clinic in 3 months with labs  Pancytopenia (HCC) Likely secondary to nutritional deficiency and cirrhosis Resolved except thrombocytopenia at the time of repeat labs  -Continue to monitor for now - Continue to take copper  supplementation  Copper  deficiency Recent labs with a level of 35.  -Continue copper  2 mg daily  Repeat labs in 3 months   Liver cirrhosis secondary to NASH [facility]) Patient has a history of liver cirrhosis secondary to NASH Being followed by GI AFP normal.   Abdominal ultrasound 04/19/2024: Consistent with liver cirrhosis  - Continue to follow with GI  Vitamin B12 deficiency Patient has mild vitamin B12 deficiency with levels less than 400  - Continue oral vitamin B12 1000 mcg daily    Orders Placed This Encounter  Procedures   Ferritin    Standing Status:   Future    Expected Date:   07/30/2024    Expiration Date:   04/30/2025   Folate    Standing Status:   Future    Expected Date:   07/30/2024    Expiration Date:   04/30/2025   Vitamin B12    Standing Status:   Future    Expected Date:   07/30/2024    Expiration Date:   04/30/2025   CBC with Differential/Platelet    Standing Status:   Future    Expected Date:   07/30/2024    Expiration Date:   04/30/2025   Comprehensive metabolic panel with GFR    Standing Status:   Future    Expected Date:   07/30/2024    Expiration Date:   04/30/2025   Iron and TIBC    Standing Status:   Future    Expected Date:   07/30/2024    Expiration Date:   04/30/2025   Copper , serum    Standing Status:   Future     Expected Date:   07/30/2024    Expiration Date:   04/30/2025    The total time spent in the appointment was 20 minutes encounter with patients including review of chart and various tests results, discussions about plan of care and coordination of care plan   All questions were answered. The patient knows to call the clinic with any problems, questions or concerns. No barriers to learning was detected.  Eduardo Grade, MD 6/9/20255:14 PM    INTERVAL HISTORY: Kimberly [name] 68 y.o. female following for thrombocytopenia.  Patient reports pedal edema today that is not new and also reports bilateral leg pains.  She has no other complaints today.  She started taking copper  supplementation last week.  She reports some easy bruising but no evidence of bleeding.   I have reviewed the past medical history, past surgical history, social history and family history with the patient   ALLERGIES:  is allergic to cinnamon, hydrocodone , naproxen, other, oxycodone , feldene [piroxicam], lyrica [pregabalin], nsaids, phenergan [promethazine hcl], sulfa antibiotics, latex, and tape.  MEDICATIONS:  Current Outpatient Medications  Medication Sig Dispense Refill   albuterol  (VENTOLIN  HFA) 108 (90 Base) MCG/ACT inhaler Inhale 2 puffs into the lungs 4 (four) times daily as needed for shortness of  breath.     Copper  Gluconate 2 MG TABS Take 1 tablet by mouth daily.     copper  tablet Take 1 tablet (2 mg total) by mouth daily. 90 tablet 0   cyclobenzaprine  (FLEXERIL ) 10 MG tablet Take 10 mg by mouth every 8 (eight) hours as needed.     denosumab  (PROLIA ) 60 MG/ML SOSY injection Inject 60 mg into the skin every 6 (six) months.     dicyclomine  (BENTYL ) 10 MG capsule TAKE ONE CAPSULE BY MOUTH THREE TIMES DAILY AS NEEDED FOR SPASMS 90 capsule 2   famotidine  (PEPCID ) 40 MG tablet Take 40 mg by mouth daily.     furosemide  (LASIX ) 20 MG tablet Take 20 mg by mouth 2 (two) times daily.     levothyroxine  (SYNTHROID ) 125  MCG tablet Take 125 mcg by mouth daily.     Multiple Vitamin (MULTIVITAMIN PO) Take 2 tablets by mouth daily. ADEK vitamin chewable     omeprazole  (PRILOSEC) 40 MG capsule Take 1 capsule (40 mg total) by mouth daily. 90 capsule 3   ondansetron  (ZOFRAN ) 4 MG tablet TAKE 1 TABLET BY MOUTH EVERY 8 HOURS AS NEEDED FOR NAUSEA OR VOMITING 30 tablet 1   potassium chloride  (MICRO-K ) 10 MEQ CR capsule Take 10 mEq by mouth 2 (two) times daily.     PROCTO-MED HC  2.5 % rectal cream PLACE 1 APPLICATION RECTALLY TWICE DAILY 28 g 1   rifaximin  (XIFAXAN ) 550 MG TABS tablet Take 1 tablet (550 mg total) by mouth 3 (three) times daily. 42 tablet 0   spironolactone  (ALDACTONE ) 25 MG tablet Take 25 mg by mouth daily.     TRELEGY ELLIPTA 100-62.5-25 MCG/ACT AEPB Inhale 1 puff into the lungs daily.     triamcinolone  cream (KENALOG) 0.1 % Apply topically.     Vitamin D, Ergocalciferol, (DRISDOL) 1.25 MG (50000 UNIT) CAPS capsule Take 50,000 Units by mouth once a week.     zolpidem (AMBIEN) 5 MG tablet Take 5 mg by mouth at bedtime.     No current facility-administered medications for this visit.     REVIEW OF SYSTEMS:   Constitutional: Denies fevers, chills or night sweats Eyes: Denies blurriness of vision Ears, nose, mouth, throat, and face: Denies mucositis or sore throat Respiratory: Denies cough, dyspnea or wheezes Cardiovascular: Denies palpitation, chest discomfort or lower extremity swelling Gastrointestinal:  Denies nausea, heartburn or change in bowel habits Skin: Denies abnormal skin rashes Lymphatics: Denies new lymphadenopathy or easy bruising Neurological:Denies numbness, tingling or new weaknesses Behavioral/Psych: Mood is stable, no new changes  All other systems were reviewed with the patient and are negative.  PHYSICAL EXAMINATION:   Vitals:   04/30/24 1324  BP: 115/75  Pulse: 60  Resp: 18  Temp: 98.2 F (36.8 C)  SpO2: 100%    GENERAL:alert, no distress and comfortable SKIN: skin  color, texture, turgor are normal, no rashes or significant lesions, small bruits on the left arm 0.5 cm in diameter  LYMPH:  no palpable lymphadenopathy in the cervical, axillary or inguinal LUNGS: clear to auscultation and percussion with normal breathing effort HEART: regular rate & rhythm and no murmurs and no lower extremity edema ABDOMEN:abdomen soft, non-tender and normal bowel sounds Musculoskeletal:no cyanosis of digits and no clubbing   LABORATORY DATA:  I have reviewed the data as listed  Lab Results  Component Value Date   WBC 4.7 04/13/2024   NEUTROABS 2.5 04/13/2024   HGB 12.6 04/13/2024   HCT 35.2 (L) 04/13/2024   MCV 99.4  04/13/2024   PLT 143 (L) 04/13/2024       Chemistry      Component Value Date/Time   NA 137 04/13/2024 1049   K 3.6 04/13/2024 1049   CL 111 04/13/2024 1049   CO2 20 (L) 04/13/2024 1049   BUN 17 04/13/2024 1049   CREATININE 1.10 (H) 04/13/2024 1049   CREATININE 1.07 (H) 04/09/2024 1439      Component Value Date/Time   CALCIUM  8.9 04/13/2024 1049   ALKPHOS 120 04/13/2024 1049   AST 45 (H) 04/13/2024 1049   ALT 30 04/13/2024 1049   BILITOT 1.6 (H) 04/13/2024 1049      Latest Reference Range & Units 04/13/24 10:49  Iron 28 - 170 ug/dL 409  UIBC ug/dL 811  TIBC 914 - 782 ug/dL 956  Saturation Ratios 10.4 - 31.8 % 29  Ferritin 11 - 307 ng/mL 126  Folate >5.9 ng/mL 11.5  Copper  80 - 158 ug/dL 35 (L)  Vitamin B12 213 - 914 pg/mL 291  (L): Data is abnormally low   RADIOGRAPHIC STUDIES: I have personally reviewed the radiological report as listed below  US  Abdomen Limited RUQ (LIVER/GB) CLINICAL DATA:  Cirrhosis.  EXAM: ULTRASOUND ABDOMEN LIMITED RIGHT UPPER QUADRANT  COMPARISON:  July 29, 2023  FINDINGS: Gallbladder:  Surgically removed.  Common bile duct:  Diameter: 7 mm, mildly dilated, postsurgical in nature.  Liver:  No focal lesion. Increased echotexture with mild nodular contour. Portal vein is patent on  color Doppler imaging with normal direction of blood flow towards the liver.  Other: None.  IMPRESSION: 1. No acute abnormality identified. 2. Increased echotexture with mild nodular contour of the liver, consistent with cirrhosis.  Electronically Signed   By: Anna Barnes M.D.   On: 04/19/2024 09:43

## 2024-05-07 DIAGNOSIS — R269 Unspecified abnormalities of gait and mobility: Secondary | ICD-10-CM | POA: Diagnosis not present

## 2024-05-07 DIAGNOSIS — M25562 Pain in left knee: Secondary | ICD-10-CM | POA: Diagnosis not present

## 2024-05-07 DIAGNOSIS — M6281 Muscle weakness (generalized): Secondary | ICD-10-CM | POA: Diagnosis not present

## 2024-05-07 DIAGNOSIS — M25561 Pain in right knee: Secondary | ICD-10-CM | POA: Diagnosis not present

## 2024-05-09 DIAGNOSIS — M25561 Pain in right knee: Secondary | ICD-10-CM | POA: Diagnosis not present

## 2024-05-09 DIAGNOSIS — M6281 Muscle weakness (generalized): Secondary | ICD-10-CM | POA: Diagnosis not present

## 2024-05-09 DIAGNOSIS — M25562 Pain in left knee: Secondary | ICD-10-CM | POA: Diagnosis not present

## 2024-05-09 DIAGNOSIS — R269 Unspecified abnormalities of gait and mobility: Secondary | ICD-10-CM | POA: Diagnosis not present

## 2024-05-15 ENCOUNTER — Ambulatory Visit (HOSPITAL_COMMUNITY)
Admission: RE | Admit: 2024-05-15 | Discharge: 2024-05-15 | Disposition: A | Discharge status: Home / Self Care | Source: Ambulatory Visit | Attending: Cardiology | Admitting: Cardiology

## 2024-05-15 ENCOUNTER — Ambulatory Visit (INDEPENDENT_AMBULATORY_CARE_PROVIDER_SITE_OTHER): Admitting: Gastroenterology

## 2024-05-15 DIAGNOSIS — R0602 Shortness of breath: Secondary | ICD-10-CM | POA: Diagnosis not present

## 2024-05-15 LAB — ECHOCARDIOGRAM COMPLETE
AR max vel: 2.06 cm2
AV Area VTI: 2.19 cm2
AV Area mean vel: 1.95 cm2
AV Mean grad: 2.8 mmHg
AV Peak grad: 5.5 mmHg
Ao pk vel: 1.18 m/s
Area-P 1/2: 2.91 cm2
S' Lateral: 2.7 cm

## 2024-05-15 NOTE — Progress Notes (Signed)
*  PRELIMINARY RESULTS* Echocardiogram 2D Echocardiogram has been performed.  Kimberly [name] 05/15/2024, 2:42 PM

## 2024-05-17 DIAGNOSIS — R269 Unspecified abnormalities of gait and mobility: Secondary | ICD-10-CM | POA: Diagnosis not present

## 2024-05-17 DIAGNOSIS — M6281 Muscle weakness (generalized): Secondary | ICD-10-CM | POA: Diagnosis not present

## 2024-05-17 DIAGNOSIS — M25562 Pain in left knee: Secondary | ICD-10-CM | POA: Diagnosis not present

## 2024-05-17 DIAGNOSIS — M25561 Pain in right knee: Secondary | ICD-10-CM | POA: Diagnosis not present

## 2024-05-23 ENCOUNTER — Telehealth: Payer: Self-pay | Admitting: Cardiology

## 2024-05-23 ENCOUNTER — Ambulatory Visit: Payer: Self-pay | Admitting: Cardiology

## 2024-05-23 DIAGNOSIS — J449 Chronic obstructive pulmonary disease, unspecified: Secondary | ICD-10-CM | POA: Diagnosis not present

## 2024-05-23 NOTE — Telephone Encounter (Signed)
 Echo shows normal heart pumping function. She has mild age related stiffness of the heart muscle which is common and a minor finding. Overall heart function looks good without significant abnormal findings   Kimberly Ross MD

## 2024-05-23 NOTE — Telephone Encounter (Signed)
 Pt requesting a c/b in regards to results. Please advise

## 2024-05-23 NOTE — Telephone Encounter (Signed)
 The patient has been notified of the result and verbalized understanding.  All questions (if any) were answered. Bernett Dorothyann LABOR, RN 05/23/2024 2:34 PM    PCP copied

## 2024-05-23 NOTE — Telephone Encounter (Signed)
 Pt notified that once reviewed by provider we will call with results.

## 2024-05-23 NOTE — Telephone Encounter (Signed)
 ECHO DONE 05/15/24  Will send to Dr.branch to review/read

## 2024-05-29 DIAGNOSIS — M6281 Muscle weakness (generalized): Secondary | ICD-10-CM | POA: Diagnosis not present

## 2024-05-29 DIAGNOSIS — M25561 Pain in right knee: Secondary | ICD-10-CM | POA: Diagnosis not present

## 2024-05-29 DIAGNOSIS — R269 Unspecified abnormalities of gait and mobility: Secondary | ICD-10-CM | POA: Diagnosis not present

## 2024-05-29 DIAGNOSIS — M25562 Pain in left knee: Secondary | ICD-10-CM | POA: Diagnosis not present

## 2024-06-05 DIAGNOSIS — M48061 Spinal stenosis, lumbar region without neurogenic claudication: Secondary | ICD-10-CM | POA: Diagnosis not present

## 2024-06-05 DIAGNOSIS — Z6824 Body mass index (BMI) 24.0-24.9, adult: Secondary | ICD-10-CM | POA: Diagnosis not present

## 2024-06-05 DIAGNOSIS — K7460 Unspecified cirrhosis of liver: Secondary | ICD-10-CM | POA: Diagnosis not present

## 2024-06-05 DIAGNOSIS — R29898 Other symptoms and signs involving the musculoskeletal system: Secondary | ICD-10-CM | POA: Diagnosis not present

## 2024-06-05 DIAGNOSIS — R079 Chest pain, unspecified: Secondary | ICD-10-CM | POA: Diagnosis not present

## 2024-06-05 DIAGNOSIS — J449 Chronic obstructive pulmonary disease, unspecified: Secondary | ICD-10-CM | POA: Diagnosis not present

## 2024-06-05 DIAGNOSIS — H3530 Unspecified macular degeneration: Secondary | ICD-10-CM | POA: Diagnosis not present

## 2024-06-05 DIAGNOSIS — M5135 Other intervertebral disc degeneration, thoracolumbar region: Secondary | ICD-10-CM | POA: Diagnosis not present

## 2024-06-05 DIAGNOSIS — D649 Anemia, unspecified: Secondary | ICD-10-CM | POA: Diagnosis not present

## 2024-06-05 DIAGNOSIS — E119 Type 2 diabetes mellitus without complications: Secondary | ICD-10-CM | POA: Diagnosis not present

## 2024-06-05 DIAGNOSIS — M5416 Radiculopathy, lumbar region: Secondary | ICD-10-CM | POA: Diagnosis not present

## 2024-06-05 DIAGNOSIS — M47816 Spondylosis without myelopathy or radiculopathy, lumbar region: Secondary | ICD-10-CM | POA: Diagnosis not present

## 2024-06-07 DIAGNOSIS — M6281 Muscle weakness (generalized): Secondary | ICD-10-CM | POA: Diagnosis not present

## 2024-06-07 DIAGNOSIS — M25562 Pain in left knee: Secondary | ICD-10-CM | POA: Diagnosis not present

## 2024-06-07 DIAGNOSIS — M25561 Pain in right knee: Secondary | ICD-10-CM | POA: Diagnosis not present

## 2024-06-07 DIAGNOSIS — R269 Unspecified abnormalities of gait and mobility: Secondary | ICD-10-CM | POA: Diagnosis not present

## 2024-06-08 ENCOUNTER — Emergency Department (HOSPITAL_COMMUNITY)

## 2024-06-08 ENCOUNTER — Inpatient Hospital Stay (HOSPITAL_COMMUNITY)
Admission: EM | Admit: 2024-06-08 | Discharge: 2024-06-18 | DRG: 956 | Disposition: A | Discharge status: Rehabilitation Facility | Attending: Physical Medicine & Rehabilitation | Admitting: Physical Medicine & Rehabilitation

## 2024-06-08 ENCOUNTER — Encounter (HOSPITAL_COMMUNITY): Payer: Self-pay

## 2024-06-08 ENCOUNTER — Inpatient Hospital Stay (HOSPITAL_COMMUNITY)

## 2024-06-08 ENCOUNTER — Other Ambulatory Visit: Payer: Self-pay

## 2024-06-08 ENCOUNTER — Inpatient Hospital Stay (HOSPITAL_COMMUNITY): Admitting: Anesthesiology

## 2024-06-08 ENCOUNTER — Encounter (HOSPITAL_COMMUNITY): Admission: EM | Disposition: A | Discharge status: Rehabilitation Facility | Payer: Self-pay | Source: Home / Self Care

## 2024-06-08 DIAGNOSIS — W010XXA Fall on same level from slipping, tripping and stumbling without subsequent striking against object, initial encounter: Secondary | ICD-10-CM | POA: Diagnosis present

## 2024-06-08 DIAGNOSIS — M542 Cervicalgia: Secondary | ICD-10-CM | POA: Diagnosis present

## 2024-06-08 DIAGNOSIS — J4489 Other specified chronic obstructive pulmonary disease: Secondary | ICD-10-CM | POA: Diagnosis not present

## 2024-06-08 DIAGNOSIS — M47816 Spondylosis without myelopathy or radiculopathy, lumbar region: Secondary | ICD-10-CM | POA: Diagnosis not present

## 2024-06-08 DIAGNOSIS — M810 Age-related osteoporosis without current pathological fracture: Secondary | ICD-10-CM | POA: Diagnosis present

## 2024-06-08 DIAGNOSIS — M797 Fibromyalgia: Secondary | ICD-10-CM | POA: Diagnosis not present

## 2024-06-08 DIAGNOSIS — E785 Hyperlipidemia, unspecified: Secondary | ICD-10-CM | POA: Diagnosis not present

## 2024-06-08 DIAGNOSIS — M8588 Other specified disorders of bone density and structure, other site: Secondary | ICD-10-CM | POA: Diagnosis not present

## 2024-06-08 DIAGNOSIS — Z882 Allergy status to sulfonamides status: Secondary | ICD-10-CM | POA: Diagnosis not present

## 2024-06-08 DIAGNOSIS — S2241XA Multiple fractures of ribs, right side, initial encounter for closed fracture: Secondary | ICD-10-CM | POA: Diagnosis not present

## 2024-06-08 DIAGNOSIS — M48061 Spinal stenosis, lumbar region without neurogenic claudication: Secondary | ICD-10-CM | POA: Diagnosis not present

## 2024-06-08 DIAGNOSIS — D539 Nutritional anemia, unspecified: Secondary | ICD-10-CM

## 2024-06-08 DIAGNOSIS — Y92002 Bathroom of unspecified non-institutional (private) residence single-family (private) house as the place of occurrence of the external cause: Secondary | ICD-10-CM | POA: Diagnosis not present

## 2024-06-08 DIAGNOSIS — Z8249 Family history of ischemic heart disease and other diseases of the circulatory system: Secondary | ICD-10-CM | POA: Diagnosis not present

## 2024-06-08 DIAGNOSIS — M546 Pain in thoracic spine: Secondary | ICD-10-CM | POA: Diagnosis not present

## 2024-06-08 DIAGNOSIS — R079 Chest pain, unspecified: Secondary | ICD-10-CM | POA: Diagnosis not present

## 2024-06-08 DIAGNOSIS — K592 Neurogenic bowel, not elsewhere classified: Secondary | ICD-10-CM | POA: Diagnosis not present

## 2024-06-08 DIAGNOSIS — W19XXXA Unspecified fall, initial encounter: Principal | ICD-10-CM

## 2024-06-08 DIAGNOSIS — S22088A Other fracture of T11-T12 vertebra, initial encounter for closed fracture: Principal | ICD-10-CM | POA: Diagnosis present

## 2024-06-08 DIAGNOSIS — E039 Hypothyroidism, unspecified: Secondary | ICD-10-CM | POA: Diagnosis not present

## 2024-06-08 DIAGNOSIS — S728X2A Other fracture of left femur, initial encounter for closed fracture: Secondary | ICD-10-CM | POA: Diagnosis not present

## 2024-06-08 DIAGNOSIS — S72342A Displaced spiral fracture of shaft of left femur, initial encounter for closed fracture: Secondary | ICD-10-CM | POA: Diagnosis present

## 2024-06-08 DIAGNOSIS — Z7983 Long term (current) use of bisphosphonates: Secondary | ICD-10-CM

## 2024-06-08 DIAGNOSIS — S22082A Unstable burst fracture of T11-T12 vertebra, initial encounter for closed fracture: Secondary | ICD-10-CM | POA: Diagnosis not present

## 2024-06-08 DIAGNOSIS — K219 Gastro-esophageal reflux disease without esophagitis: Secondary | ICD-10-CM | POA: Diagnosis present

## 2024-06-08 DIAGNOSIS — M4854XA Collapsed vertebra, not elsewhere classified, thoracic region, initial encounter for fracture: Secondary | ICD-10-CM | POA: Diagnosis not present

## 2024-06-08 DIAGNOSIS — Z96651 Presence of right artificial knee joint: Secondary | ICD-10-CM | POA: Diagnosis not present

## 2024-06-08 DIAGNOSIS — I110 Hypertensive heart disease with heart failure: Secondary | ICD-10-CM | POA: Diagnosis not present

## 2024-06-08 DIAGNOSIS — Z91040 Latex allergy status: Secondary | ICD-10-CM

## 2024-06-08 DIAGNOSIS — S300XXA Contusion of lower back and pelvis, initial encounter: Secondary | ICD-10-CM | POA: Diagnosis not present

## 2024-06-08 DIAGNOSIS — I10 Essential (primary) hypertension: Secondary | ICD-10-CM | POA: Diagnosis not present

## 2024-06-08 DIAGNOSIS — M4804 Spinal stenosis, thoracic region: Secondary | ICD-10-CM | POA: Diagnosis not present

## 2024-06-08 DIAGNOSIS — R109 Unspecified abdominal pain: Secondary | ICD-10-CM | POA: Diagnosis not present

## 2024-06-08 DIAGNOSIS — G8929 Other chronic pain: Secondary | ICD-10-CM | POA: Diagnosis present

## 2024-06-08 DIAGNOSIS — M4802 Spinal stenosis, cervical region: Secondary | ICD-10-CM | POA: Diagnosis not present

## 2024-06-08 DIAGNOSIS — S22089A Unspecified fracture of T11-T12 vertebra, initial encounter for closed fracture: Secondary | ICD-10-CM

## 2024-06-08 DIAGNOSIS — S72002A Fracture of unspecified part of neck of left femur, initial encounter for closed fracture: Secondary | ICD-10-CM

## 2024-06-08 DIAGNOSIS — H3530 Unspecified macular degeneration: Secondary | ICD-10-CM | POA: Diagnosis not present

## 2024-06-08 DIAGNOSIS — M4814 Ankylosing hyperostosis [Forestier], thoracic region: Secondary | ICD-10-CM | POA: Diagnosis not present

## 2024-06-08 DIAGNOSIS — Z91048 Other nonmedicinal substance allergy status: Secondary | ICD-10-CM | POA: Diagnosis not present

## 2024-06-08 DIAGNOSIS — S22082D Unstable burst fracture of T11-T12 vertebra, subsequent encounter for fracture with routine healing: Secondary | ICD-10-CM | POA: Diagnosis not present

## 2024-06-08 DIAGNOSIS — D62 Acute posthemorrhagic anemia: Secondary | ICD-10-CM | POA: Diagnosis not present

## 2024-06-08 DIAGNOSIS — R5381 Other malaise: Secondary | ICD-10-CM | POA: Diagnosis not present

## 2024-06-08 DIAGNOSIS — K5900 Constipation, unspecified: Secondary | ICD-10-CM | POA: Diagnosis not present

## 2024-06-08 DIAGNOSIS — Y92009 Unspecified place in unspecified non-institutional (private) residence as the place of occurrence of the external cause: Principal | ICD-10-CM

## 2024-06-08 DIAGNOSIS — M47812 Spondylosis without myelopathy or radiculopathy, cervical region: Secondary | ICD-10-CM | POA: Diagnosis not present

## 2024-06-08 DIAGNOSIS — S22009A Unspecified fracture of unspecified thoracic vertebra, initial encounter for closed fracture: Secondary | ICD-10-CM | POA: Diagnosis present

## 2024-06-08 DIAGNOSIS — R404 Transient alteration of awareness: Secondary | ICD-10-CM | POA: Diagnosis not present

## 2024-06-08 DIAGNOSIS — I5032 Chronic diastolic (congestive) heart failure: Secondary | ICD-10-CM | POA: Diagnosis present

## 2024-06-08 DIAGNOSIS — M5134 Other intervertebral disc degeneration, thoracic region: Secondary | ICD-10-CM | POA: Diagnosis not present

## 2024-06-08 DIAGNOSIS — S3210XA Unspecified fracture of sacrum, initial encounter for closed fracture: Secondary | ICD-10-CM | POA: Diagnosis present

## 2024-06-08 DIAGNOSIS — I69354 Hemiplegia and hemiparesis following cerebral infarction affecting left non-dominant side: Secondary | ICD-10-CM | POA: Diagnosis not present

## 2024-06-08 DIAGNOSIS — D709 Neutropenia, unspecified: Secondary | ICD-10-CM | POA: Diagnosis not present

## 2024-06-08 DIAGNOSIS — M1712 Unilateral primary osteoarthritis, left knee: Secondary | ICD-10-CM | POA: Diagnosis not present

## 2024-06-08 DIAGNOSIS — M25511 Pain in right shoulder: Secondary | ICD-10-CM | POA: Diagnosis not present

## 2024-06-08 DIAGNOSIS — Z79890 Hormone replacement therapy (postmenopausal): Secondary | ICD-10-CM

## 2024-06-08 DIAGNOSIS — S22082K Unstable burst fracture of T11-T12 vertebra, subsequent encounter for fracture with nonunion: Secondary | ICD-10-CM | POA: Diagnosis not present

## 2024-06-08 DIAGNOSIS — I2510 Atherosclerotic heart disease of native coronary artery without angina pectoris: Secondary | ICD-10-CM

## 2024-06-08 DIAGNOSIS — D696 Thrombocytopenia, unspecified: Secondary | ICD-10-CM

## 2024-06-08 DIAGNOSIS — M48062 Spinal stenosis, lumbar region with neurogenic claudication: Secondary | ICD-10-CM | POA: Diagnosis not present

## 2024-06-08 DIAGNOSIS — I272 Pulmonary hypertension, unspecified: Secondary | ICD-10-CM | POA: Diagnosis not present

## 2024-06-08 DIAGNOSIS — T07XXXA Unspecified multiple injuries, initial encounter: Secondary | ICD-10-CM | POA: Diagnosis not present

## 2024-06-08 DIAGNOSIS — Z888 Allergy status to other drugs, medicaments and biological substances status: Secondary | ICD-10-CM | POA: Diagnosis not present

## 2024-06-08 DIAGNOSIS — M25561 Pain in right knee: Secondary | ICD-10-CM | POA: Diagnosis present

## 2024-06-08 DIAGNOSIS — Z751 Person awaiting admission to adequate facility elsewhere: Secondary | ICD-10-CM | POA: Diagnosis not present

## 2024-06-08 DIAGNOSIS — Z9884 Bariatric surgery status: Secondary | ICD-10-CM

## 2024-06-08 DIAGNOSIS — M545 Low back pain, unspecified: Secondary | ICD-10-CM | POA: Diagnosis not present

## 2024-06-08 DIAGNOSIS — S72402A Unspecified fracture of lower end of left femur, initial encounter for closed fracture: Secondary | ICD-10-CM | POA: Diagnosis not present

## 2024-06-08 DIAGNOSIS — R739 Hyperglycemia, unspecified: Secondary | ICD-10-CM

## 2024-06-08 DIAGNOSIS — M40204 Unspecified kyphosis, thoracic region: Secondary | ICD-10-CM | POA: Diagnosis not present

## 2024-06-08 DIAGNOSIS — M25522 Pain in left elbow: Secondary | ICD-10-CM | POA: Diagnosis present

## 2024-06-08 DIAGNOSIS — Z886 Allergy status to analgesic agent status: Secondary | ICD-10-CM | POA: Diagnosis not present

## 2024-06-08 DIAGNOSIS — S3992XA Unspecified injury of lower back, initial encounter: Secondary | ICD-10-CM | POA: Diagnosis not present

## 2024-06-08 DIAGNOSIS — D61818 Other pancytopenia: Secondary | ICD-10-CM | POA: Diagnosis not present

## 2024-06-08 DIAGNOSIS — S72352A Displaced comminuted fracture of shaft of left femur, initial encounter for closed fracture: Secondary | ICD-10-CM | POA: Diagnosis not present

## 2024-06-08 DIAGNOSIS — M47814 Spondylosis without myelopathy or radiculopathy, thoracic region: Secondary | ICD-10-CM | POA: Diagnosis not present

## 2024-06-08 DIAGNOSIS — S022XXA Fracture of nasal bones, initial encounter for closed fracture: Secondary | ICD-10-CM | POA: Diagnosis not present

## 2024-06-08 DIAGNOSIS — K7460 Unspecified cirrhosis of liver: Secondary | ICD-10-CM | POA: Diagnosis not present

## 2024-06-08 DIAGNOSIS — S72142A Displaced intertrochanteric fracture of left femur, initial encounter for closed fracture: Secondary | ICD-10-CM | POA: Diagnosis not present

## 2024-06-08 DIAGNOSIS — M50123 Cervical disc disorder at C6-C7 level with radiculopathy: Secondary | ICD-10-CM | POA: Diagnosis not present

## 2024-06-08 DIAGNOSIS — E1159 Type 2 diabetes mellitus with other circulatory complications: Secondary | ICD-10-CM | POA: Diagnosis not present

## 2024-06-08 DIAGNOSIS — M51369 Other intervertebral disc degeneration, lumbar region without mention of lumbar back pain or lower extremity pain: Secondary | ICD-10-CM | POA: Diagnosis not present

## 2024-06-08 DIAGNOSIS — T148XXA Other injury of unspecified body region, initial encounter: Secondary | ICD-10-CM | POA: Diagnosis not present

## 2024-06-08 DIAGNOSIS — M452 Ankylosing spondylitis of cervical region: Secondary | ICD-10-CM | POA: Diagnosis not present

## 2024-06-08 DIAGNOSIS — M50323 Other cervical disc degeneration at C6-C7 level: Secondary | ICD-10-CM | POA: Diagnosis not present

## 2024-06-08 DIAGNOSIS — R519 Headache, unspecified: Secondary | ICD-10-CM | POA: Diagnosis not present

## 2024-06-08 HISTORY — PX: LUMBAR PERCUTANEOUS PEDICLE SCREW 2 LEVEL: SHX5561

## 2024-06-08 HISTORY — PX: FEMUR IM NAIL: SHX1597

## 2024-06-08 HISTORY — PX: ORIF FEMUR FRACTURE: SHX2119

## 2024-06-08 LAB — POCT I-STAT 7, (LYTES, BLD GAS, ICA,H+H)
Acid-base deficit: 2 mmol/L (ref 0.0–2.0)
Acid-base deficit: 4 mmol/L — ABNORMAL HIGH (ref 0.0–2.0)
Acid-base deficit: 6 mmol/L — ABNORMAL HIGH (ref 0.0–2.0)
Bicarbonate: 22 mmol/L (ref 20.0–28.0)
Bicarbonate: 20.3 mmol/L (ref 20.0–28.0)
Bicarbonate: 20.5 mmol/L (ref 20.0–28.0)
Calcium, Ion: 1.02 mmol/L — ABNORMAL LOW (ref 1.15–1.40)
Calcium, Ion: 0.95 mmol/L — ABNORMAL LOW (ref 1.15–1.40)
Calcium, Ion: 0.98 mmol/L — ABNORMAL LOW (ref 1.15–1.40)
HCT: 25 % — ABNORMAL LOW (ref 36.0–46.0)
HCT: 25 % — ABNORMAL LOW (ref 36.0–46.0)
HCT: 32 % — ABNORMAL LOW (ref 36.0–46.0)
Hemoglobin: 8.5 g/dL — ABNORMAL LOW (ref 12.0–15.0)
Hemoglobin: 8.5 g/dL — ABNORMAL LOW (ref 12.0–15.0)
Hemoglobin: 10.9 g/dL — ABNORMAL LOW (ref 12.0–15.0)
O2 Saturation: 100 %
O2 Saturation: 100 %
O2 Saturation: 100 %
Patient temperature: 35.3
Potassium: 4.1 mmol/L (ref 3.5–5.1)
Potassium: 4.1 mmol/L (ref 3.5–5.1)
Potassium: 3.8 mmol/L (ref 3.5–5.1)
Sodium: 138 mmol/L (ref 135–145)
Sodium: 139 mmol/L (ref 135–145)
Sodium: 141 mmol/L (ref 135–145)
TCO2: 23 mmol/L (ref 22–32)
TCO2: 21 mmol/L — ABNORMAL LOW (ref 22–32)
TCO2: 22 mmol/L (ref 22–32)
pCO2 arterial: 35.4 mmHg (ref 32–48)
pCO2 arterial: 31.9 mmHg — ABNORMAL LOW (ref 32–48)
pCO2 arterial: 41.3 mmHg (ref 32–48)
pH, Arterial: 7.402 (ref 7.35–7.45)
pH, Arterial: 7.404 (ref 7.35–7.45)
pH, Arterial: 7.303 — ABNORMAL LOW (ref 7.35–7.45)
pO2, Arterial: 519 mmHg — ABNORMAL HIGH (ref 83–108)
pO2, Arterial: 270 mmHg — ABNORMAL HIGH (ref 83–108)
pO2, Arterial: 322 mmHg — ABNORMAL HIGH (ref 83–108)

## 2024-06-08 LAB — CBC WITH DIFFERENTIAL/PLATELET
Abs Immature Granulocytes: 0.02 K/uL (ref 0.00–0.07)
Basophils Absolute: 0 K/uL (ref 0.0–0.1)
Basophils Relative: 0 %
Eosinophils Absolute: 0 K/uL (ref 0.0–0.5)
Eosinophils Relative: 0 %
HCT: 34.2 % — ABNORMAL LOW (ref 36.0–46.0)
Hemoglobin: 11.6 g/dL — ABNORMAL LOW (ref 12.0–15.0)
Immature Granulocytes: 0 %
Lymphocytes Relative: 17 %
Lymphs Abs: 1.2 K/uL (ref 0.7–4.0)
MCH: 34.6 pg — ABNORMAL HIGH (ref 26.0–34.0)
MCHC: 33.9 g/dL (ref 30.0–36.0)
MCV: 102.1 fL — ABNORMAL HIGH (ref 80.0–100.0)
Monocytes Absolute: 0.4 K/uL (ref 0.1–1.0)
Monocytes Relative: 6 %
Neutro Abs: 5.3 K/uL (ref 1.7–7.7)
Neutrophils Relative %: 77 %
Platelets: 136 K/uL — ABNORMAL LOW (ref 150–400)
RBC: 3.35 MIL/uL — ABNORMAL LOW (ref 3.87–5.11)
RDW: 13.7 % (ref 11.5–15.5)
WBC: 7 K/uL (ref 4.0–10.5)
nRBC: 0 % (ref 0.0–0.2)

## 2024-06-08 LAB — PROTIME-INR
INR: 1.2 (ref 0.8–1.2)
Prothrombin Time: 15.6 s — ABNORMAL HIGH (ref 11.4–15.2)

## 2024-06-08 LAB — BASIC METABOLIC PANEL WITH GFR
Anion gap: 12 (ref 5–15)
BUN: 14 mg/dL (ref 8–23)
CO2: 22 mmol/L (ref 22–32)
Calcium: 8.5 mg/dL — ABNORMAL LOW (ref 8.9–10.3)
Chloride: 104 mmol/L (ref 98–111)
Creatinine, Ser: 1.07 mg/dL — ABNORMAL HIGH (ref 0.44–1.00)
GFR, Estimated: 57 mL/min — ABNORMAL LOW (ref >60)
Glucose, Bld: 108 mg/dL — ABNORMAL HIGH (ref 70–99)
Potassium: 3.6 mmol/L (ref 3.5–5.1)
Sodium: 138 mmol/L (ref 135–145)

## 2024-06-08 LAB — SURGICAL PCR SCREEN
MRSA, PCR: NOT DETECTED — AB
Staphylococcus aureus: NOT DETECTED — AB

## 2024-06-08 LAB — PREPARE RBC (CROSSMATCH)

## 2024-06-08 LAB — CBG MONITORING, ED: Glucose-Capillary: 113 mg/dL — ABNORMAL HIGH (ref 70–99)

## 2024-06-08 LAB — GLUCOSE, CAPILLARY: Glucose-Capillary: 163 mg/dL — ABNORMAL HIGH (ref 70–99)

## 2024-06-08 SURGERY — INSERTION, INTRAMEDULLARY ROD, FEMUR
Anesthesia: General | Site: Spine Thoracic

## 2024-06-08 MED ORDER — HYDROMORPHONE HCL 1 MG/ML IJ SOLN
INTRAMUSCULAR | Status: AC
  Filled 2024-06-08: qty 1

## 2024-06-08 MED ORDER — METHOCARBAMOL 1000 MG/10ML IJ SOLN
500.0000 mg | Freq: Three times a day (TID) | INTRAMUSCULAR | Status: DC
  Administered 2024-06-08: 500 mg via INTRAVENOUS
  Filled 2024-06-08: qty 10

## 2024-06-08 MED ORDER — HYDROMORPHONE HCL 1 MG/ML IJ SOLN
INTRAMUSCULAR | Status: AC
  Filled 2024-06-08: qty 0.5

## 2024-06-08 MED ORDER — FUROSEMIDE 20 MG PO TABS
20.0000 mg | ORAL_TABLET | Freq: Every day | ORAL | Status: DC
Start: 2024-06-08 — End: 2024-06-18
  Administered 2024-06-08 – 2024-06-18 (×11): 20 mg via ORAL
  Filled 2024-06-08 (×11): qty 1

## 2024-06-08 MED ORDER — METHOCARBAMOL 750 MG PO TABS
750.0000 mg | ORAL_TABLET | Freq: Four times a day (QID) | ORAL | Status: DC
  Administered 2024-06-09 – 2024-06-18 (×37): 750 mg via ORAL
  Filled 2024-06-08: qty 1
  Filled 2024-06-08 (×2): qty 2
  Filled 2024-06-08 (×2): qty 1
  Filled 2024-06-08: qty 2
  Filled 2024-06-08 (×3): qty 1
  Filled 2024-06-08: qty 2
  Filled 2024-06-08 (×10): qty 1
  Filled 2024-06-08: qty 2
  Filled 2024-06-08: qty 1
  Filled 2024-06-08: qty 2
  Filled 2024-06-08 (×8): qty 1
  Filled 2024-06-08: qty 2
  Filled 2024-06-08 (×3): qty 1
  Filled 2024-06-08: qty 2

## 2024-06-08 MED ORDER — SODIUM CHLORIDE 0.9 % IV SOLN: 250.0000 mL | INTRAVENOUS | Status: AC

## 2024-06-08 MED ORDER — TRANEXAMIC ACID-NACL 1000-0.7 MG/100ML-% IV SOLN
1000.0000 mg | INTRAVENOUS | Status: AC
  Administered 2024-06-08: 1000 mg via INTRAVENOUS

## 2024-06-08 MED ORDER — CYCLOBENZAPRINE HCL 10 MG PO TABS
10.0000 mg | ORAL_TABLET | Freq: Three times a day (TID) | ORAL | Status: DC | PRN
  Administered 2024-06-10: 10 mg via ORAL
  Filled 2024-06-08 (×2): qty 1

## 2024-06-08 MED ORDER — SUGAMMADEX SODIUM 200 MG/2ML IV SOLN
INTRAVENOUS | Status: DC | PRN
  Administered 2024-06-08: 400 mg via INTRAVENOUS

## 2024-06-08 MED ORDER — DEXAMETHASONE SODIUM PHOSPHATE 10 MG/ML IJ SOLN
INTRAMUSCULAR | Status: DC | PRN
  Administered 2024-06-08: 10 mg via INTRAVENOUS

## 2024-06-08 MED ORDER — ACETAMINOPHEN 325 MG PO TABS
650.0000 mg | ORAL_TABLET | ORAL | Status: DC | PRN
Start: 2024-06-08 — End: 2024-06-09

## 2024-06-08 MED ORDER — SCOPOLAMINE 1 MG/3DAYS TD PT72
MEDICATED_PATCH | TRANSDERMAL | Status: AC
  Administered 2024-06-08: 1.5 mg via TRANSDERMAL
  Filled 2024-06-08: qty 1

## 2024-06-08 MED ORDER — POVIDONE-IODINE 10 % EX SWAB
2.0000 | Freq: Once | CUTANEOUS | Status: AC
  Administered 2024-06-08: 2 via TOPICAL

## 2024-06-08 MED ORDER — ONDANSETRON HCL 4 MG/2ML IJ SOLN
4.0000 mg | Freq: Once | INTRAMUSCULAR | Status: AC
  Administered 2024-06-08: 4 mg via INTRAVENOUS
  Filled 2024-06-08: qty 2

## 2024-06-08 MED ORDER — HYDROCODONE-ACETAMINOPHEN 5-325 MG PO TABS
2.0000 | ORAL_TABLET | ORAL | Status: DC | PRN
  Administered 2024-06-09 – 2024-06-17 (×31): 2 via ORAL
  Filled 2024-06-08 (×33): qty 2

## 2024-06-08 MED ORDER — FENTANYL CITRATE PF 50 MCG/ML IJ SOSY
50.0000 ug | PREFILLED_SYRINGE | Freq: Once | INTRAMUSCULAR | Status: AC
  Administered 2024-06-08: 50 ug via INTRAVENOUS
  Filled 2024-06-08: qty 1

## 2024-06-08 MED ORDER — HYDROCODONE-ACETAMINOPHEN 5-325 MG PO TABS
1.0000 | ORAL_TABLET | ORAL | Status: DC | PRN
  Administered 2024-06-09 – 2024-06-18 (×4): 1 via ORAL
  Filled 2024-06-08 (×5): qty 1

## 2024-06-08 MED ORDER — MIDAZOLAM HCL 2 MG/2ML IJ SOLN
INTRAMUSCULAR | Status: DC | PRN
  Administered 2024-06-08: 2 mg via INTRAVENOUS

## 2024-06-08 MED ORDER — CEFAZOLIN SODIUM-DEXTROSE 2-4 GM/100ML-% IV SOLN: 2.0000 g | Freq: Three times a day (TID) | INTRAVENOUS | Status: DC

## 2024-06-08 MED ORDER — LIDOCAINE 2% (20 MG/ML) 5 ML SYRINGE
INTRAMUSCULAR | Status: DC | PRN
  Administered 2024-06-08: 40 mg via INTRAVENOUS

## 2024-06-08 MED ORDER — THROMBIN 5000 UNITS EX SOLR: OROMUCOSAL | Status: DC | PRN

## 2024-06-08 MED ORDER — MIDAZOLAM HCL 2 MG/2ML IJ SOLN: 0.5000 mg | Freq: Once | INTRAMUSCULAR | Status: DC | PRN

## 2024-06-08 MED ORDER — SODIUM CHLORIDE 0.9 % IV SOLN: 10.0000 mL/h | Freq: Once | INTRAVENOUS | Status: AC

## 2024-06-08 MED ORDER — MORPHINE SULFATE (PF) 2 MG/ML IV SOLN
1.0000 mg | INTRAVENOUS | Status: DC | PRN
  Administered 2024-06-08 – 2024-06-10 (×3): 2 mg via INTRAVENOUS
  Filled 2024-06-08 (×4): qty 1

## 2024-06-08 MED ORDER — HYDRALAZINE HCL 20 MG/ML IJ SOLN: 10.0000 mg | INTRAMUSCULAR | Status: DC | PRN

## 2024-06-08 MED ORDER — CHLORHEXIDINE GLUCONATE 4 % EX SOLN: 60.0000 mL | Freq: Once | CUTANEOUS | Status: DC

## 2024-06-08 MED ORDER — BUPIVACAINE HCL (PF) 0.5 % IJ SOLN
INTRAMUSCULAR | Status: AC
Start: 2024-06-08 — End: 2024-06-08
  Filled 2024-06-08: qty 30

## 2024-06-08 MED ORDER — METOPROLOL TARTRATE 5 MG/5ML IV SOLN: 5.0000 mg | Freq: Four times a day (QID) | INTRAVENOUS | Status: DC | PRN

## 2024-06-08 MED ORDER — ALBUTEROL SULFATE (2.5 MG/3ML) 0.083% IN NEBU: 2.5000 mg | INHALATION_SOLUTION | Freq: Four times a day (QID) | RESPIRATORY_TRACT | Status: DC | PRN

## 2024-06-08 MED ORDER — SPIRONOLACTONE 25 MG PO TABS
25.0000 mg | ORAL_TABLET | Freq: Every day | ORAL | Status: DC
  Administered 2024-06-08 – 2024-06-18 (×11): 25 mg via ORAL
  Filled 2024-06-08 (×3): qty 1
  Filled 2024-06-08: qty 2
  Filled 2024-06-08 (×7): qty 1

## 2024-06-08 MED ORDER — PHENYLEPHRINE HCL-NACL 20-0.9 MG/250ML-% IV SOLN
INTRAVENOUS | Status: DC | PRN
  Administered 2024-06-08: 50 ug/min via INTRAVENOUS

## 2024-06-08 MED ORDER — CEFAZOLIN SODIUM-DEXTROSE 2-4 GM/100ML-% IV SOLN
INTRAVENOUS | Status: AC
  Filled 2024-06-08: qty 100

## 2024-06-08 MED ORDER — ACETAMINOPHEN 650 MG RE SUPP: 650.0000 mg | RECTAL | Status: DC | PRN

## 2024-06-08 MED ORDER — LIDOCAINE-EPINEPHRINE 1 %-1:100000 IJ SOLN
INTRAMUSCULAR | Status: DC | PRN
  Administered 2024-06-08: 5 mL
  Administered 2024-06-08: 10 mL

## 2024-06-08 MED ORDER — ROCURONIUM BROMIDE 10 MG/ML (PF) SYRINGE
PREFILLED_SYRINGE | INTRAVENOUS | Status: DC | PRN
  Administered 2024-06-08: 45 mg via INTRAVENOUS
  Administered 2024-06-08: 55 mg via INTRAVENOUS

## 2024-06-08 MED ORDER — HYDROMORPHONE HCL 1 MG/ML IJ SOLN
INTRAMUSCULAR | Status: AC
Start: 2024-06-08 — End: 2024-06-08
  Filled 2024-06-08: qty 1

## 2024-06-08 MED ORDER — SODIUM CHLORIDE 0.9% FLUSH: 3.0000 mL | INTRAVENOUS | Status: DC | PRN

## 2024-06-08 MED ORDER — ADULT MULTIVITAMIN W/MINERALS CH
1.0000 | ORAL_TABLET | Freq: Every day | ORAL | Status: DC
  Administered 2024-06-08 – 2024-06-18 (×11): 1 via ORAL
  Filled 2024-06-08 (×11): qty 1

## 2024-06-08 MED ORDER — PANTOPRAZOLE SODIUM 40 MG PO TBEC
40.0000 mg | DELAYED_RELEASE_TABLET | Freq: Every day | ORAL | Status: DC
  Administered 2024-06-08 – 2024-06-11 (×4): 40 mg via ORAL
  Filled 2024-06-08 (×4): qty 1

## 2024-06-08 MED ORDER — LIDOCAINE-EPINEPHRINE 1 %-1:100000 IJ SOLN
INTRAMUSCULAR | Status: AC
  Filled 2024-06-08: qty 1

## 2024-06-08 MED ORDER — EPHEDRINE SULFATE-NACL 50-0.9 MG/10ML-% IV SOSY
PREFILLED_SYRINGE | INTRAVENOUS | Status: DC | PRN
  Administered 2024-06-08: 7.5 mg via INTRAVENOUS

## 2024-06-08 MED ORDER — PHENOL 1.4 % MT LIQD: 1.0000 | OROMUCOSAL | Status: DC | PRN

## 2024-06-08 MED ORDER — LEVOTHYROXINE SODIUM 25 MCG PO TABS
125.0000 ug | ORAL_TABLET | Freq: Every day | ORAL | Status: DC
  Administered 2024-06-09 – 2024-06-18 (×10): 125 ug via ORAL
  Filled 2024-06-08 (×10): qty 1

## 2024-06-08 MED ORDER — HYDROMORPHONE HCL 1 MG/ML IJ SOLN
0.2500 mg | INTRAMUSCULAR | Status: DC | PRN
  Administered 2024-06-08 (×4): 0.5 mg via INTRAVENOUS

## 2024-06-08 MED ORDER — ONDANSETRON 4 MG PO TBDP: 4.0000 mg | ORAL_TABLET | Freq: Four times a day (QID) | ORAL | Status: DC | PRN

## 2024-06-08 MED ORDER — METHOCARBAMOL 500 MG PO TABS: 500.0000 mg | ORAL_TABLET | Freq: Three times a day (TID) | ORAL | Status: DC

## 2024-06-08 MED ORDER — ZOLPIDEM TARTRATE 5 MG PO TABS
5.0000 mg | ORAL_TABLET | Freq: Every evening | ORAL | Status: DC | PRN
  Administered 2024-06-12: 5 mg via ORAL
  Filled 2024-06-08: qty 1

## 2024-06-08 MED ORDER — OXYCODONE HCL 5 MG PO TABS: 5.0000 mg | ORAL_TABLET | Freq: Once | ORAL | Status: DC | PRN

## 2024-06-08 MED ORDER — ACETAMINOPHEN 500 MG PO TABS
1000.0000 mg | ORAL_TABLET | Freq: Four times a day (QID) | ORAL | Status: DC
  Administered 2024-06-08 – 2024-06-09 (×3): 1000 mg via ORAL
  Filled 2024-06-08 (×3): qty 2

## 2024-06-08 MED ORDER — MIDAZOLAM HCL 2 MG/2ML IJ SOLN
INTRAMUSCULAR | Status: AC
  Filled 2024-06-08: qty 2

## 2024-06-08 MED ORDER — SODIUM CHLORIDE 0.9% FLUSH
3.0000 mL | Freq: Two times a day (BID) | INTRAVENOUS | Status: DC
  Administered 2024-06-09 – 2024-06-18 (×19): 3 mL via INTRAVENOUS

## 2024-06-08 MED ORDER — POLYETHYLENE GLYCOL 3350 17 G PO PACK
17.0000 g | PACK | Freq: Every day | ORAL | Status: DC | PRN
  Administered 2024-06-10: 17 g via ORAL
  Filled 2024-06-08: qty 1

## 2024-06-08 MED ORDER — SCOPOLAMINE 1 MG/3DAYS TD PT72: 1.0000 | MEDICATED_PATCH | TRANSDERMAL | Status: DC

## 2024-06-08 MED ORDER — FENTANYL CITRATE (PF) 250 MCG/5ML IJ SOLN
INTRAMUSCULAR | Status: DC | PRN
  Administered 2024-06-08: 50 ug via INTRAVENOUS
  Administered 2024-06-08 (×2): 100 ug via INTRAVENOUS

## 2024-06-08 MED ORDER — GABAPENTIN 100 MG PO CAPS
100.0000 mg | ORAL_CAPSULE | Freq: Two times a day (BID) | ORAL | Status: DC
  Administered 2024-06-09 – 2024-06-18 (×19): 100 mg via ORAL
  Filled 2024-06-08 (×19): qty 1

## 2024-06-08 MED ORDER — HYDROMORPHONE HCL 1 MG/ML IJ SOLN
INTRAMUSCULAR | Status: DC | PRN
  Administered 2024-06-08 (×2): .25 mg via INTRAVENOUS

## 2024-06-08 MED ORDER — MEPERIDINE HCL 25 MG/ML IJ SOLN: 6.2500 mg | INTRAMUSCULAR | Status: DC | PRN

## 2024-06-08 MED ORDER — CEFAZOLIN SODIUM-DEXTROSE 2-4 GM/100ML-% IV SOLN
2.0000 g | INTRAVENOUS | Status: AC
  Administered 2024-06-08: 2 g via INTRAVENOUS

## 2024-06-08 MED ORDER — PROPOFOL 10 MG/ML IV BOLUS
INTRAVENOUS | Status: DC | PRN
  Administered 2024-06-08: 100 mg via INTRAVENOUS
  Administered 2024-06-08: 50 mg via INTRAVENOUS

## 2024-06-08 MED ORDER — DOCUSATE SODIUM 100 MG PO CAPS
100.0000 mg | ORAL_CAPSULE | Freq: Two times a day (BID) | ORAL | Status: DC
  Administered 2024-06-08 – 2024-06-18 (×18): 100 mg via ORAL
  Filled 2024-06-08 (×18): qty 1

## 2024-06-08 MED ORDER — THROMBIN 5000 UNITS EX KIT
PACK | CUTANEOUS | Status: AC
  Filled 2024-06-08: qty 1

## 2024-06-08 MED ORDER — ALBUMIN HUMAN 5 % IV SOLN: INTRAVENOUS | Status: DC | PRN

## 2024-06-08 MED ORDER — BUDESON-GLYCOPYRROL-FORMOTEROL 160-9-4.8 MCG/ACT IN AERO
2.0000 | INHALATION_SPRAY | Freq: Two times a day (BID) | RESPIRATORY_TRACT | Status: DC
  Administered 2024-06-09 – 2024-06-18 (×19): 2 via RESPIRATORY_TRACT
  Filled 2024-06-08: qty 5.9

## 2024-06-08 MED ORDER — TRANEXAMIC ACID-NACL 1000-0.7 MG/100ML-% IV SOLN
INTRAVENOUS | Status: AC
  Filled 2024-06-08: qty 100

## 2024-06-08 MED ORDER — BUPIVACAINE HCL (PF) 0.5 % IJ SOLN
INTRAMUSCULAR | Status: DC | PRN
  Administered 2024-06-08: 5 mL

## 2024-06-08 MED ORDER — MORPHINE SULFATE (PF) 4 MG/ML IV SOLN
4.0000 mg | INTRAVENOUS | Status: AC | PRN
  Administered 2024-06-08 (×2): 4 mg via INTRAVENOUS
  Filled 2024-06-08 (×2): qty 1

## 2024-06-08 MED ORDER — COPPER GLUCONATE 2 MG PO TABS: 2.0000 mg | ORAL_TABLET | Freq: Every day | ORAL | Status: DC

## 2024-06-08 MED ORDER — OXYCODONE HCL 5 MG/5ML PO SOLN: 5.0000 mg | Freq: Once | ORAL | Status: DC | PRN

## 2024-06-08 MED ORDER — EPHEDRINE 5 MG/ML INJ
INTRAVENOUS | Status: AC
Start: 2024-06-08 — End: 2024-06-08
  Filled 2024-06-08: qty 5

## 2024-06-08 MED ORDER — CHLORHEXIDINE GLUCONATE CLOTH 2 % EX PADS
6.0000 | MEDICATED_PAD | Freq: Every day | CUTANEOUS | Status: DC
  Administered 2024-06-09 – 2024-06-14 (×6): 6 via TOPICAL

## 2024-06-08 MED ORDER — FENTANYL CITRATE (PF) 250 MCG/5ML IJ SOLN
INTRAMUSCULAR | Status: AC
  Filled 2024-06-08: qty 5

## 2024-06-08 MED ORDER — MENTHOL 3 MG MT LOZG: 1.0000 | LOZENGE | OROMUCOSAL | Status: DC | PRN

## 2024-06-08 MED ORDER — ONDANSETRON HCL 4 MG/2ML IJ SOLN
4.0000 mg | Freq: Four times a day (QID) | INTRAMUSCULAR | Status: DC | PRN
  Administered 2024-06-08 – 2024-06-09 (×2): 4 mg via INTRAVENOUS
  Filled 2024-06-08 (×2): qty 2

## 2024-06-08 MED ORDER — PHENYLEPHRINE 80 MCG/ML (10ML) SYRINGE FOR IV PUSH (FOR BLOOD PRESSURE SUPPORT)
PREFILLED_SYRINGE | INTRAVENOUS | Status: DC | PRN
  Administered 2024-06-08: 80 ug via INTRAVENOUS
  Administered 2024-06-08: 200 ug via INTRAVENOUS
  Administered 2024-06-08 (×4): 160 ug via INTRAVENOUS
  Administered 2024-06-08: 80 ug via INTRAVENOUS

## 2024-06-08 MED ORDER — MORPHINE SULFATE (PF) 4 MG/ML IV SOLN
4.0000 mg | Freq: Once | INTRAVENOUS | Status: AC
  Administered 2024-06-08: 4 mg via INTRAVENOUS
  Filled 2024-06-08: qty 1

## 2024-06-08 MED ORDER — 0.9 % SODIUM CHLORIDE (POUR BTL) OPTIME
TOPICAL | Status: DC | PRN
  Administered 2024-06-08 (×2): 1000 mL

## 2024-06-08 MED ORDER — SODIUM CHLORIDE 0.9 % IV SOLN: 10.0000 mL/h | Freq: Once | INTRAVENOUS | Status: DC

## 2024-06-08 MED ORDER — OXYCODONE HCL 5 MG PO TABS: 2.5000 mg | ORAL_TABLET | ORAL | Status: DC | PRN

## 2024-06-08 MED ORDER — CEFAZOLIN SODIUM-DEXTROSE 2-4 GM/100ML-% IV SOLN
2.0000 g | Freq: Four times a day (QID) | INTRAVENOUS | Status: AC
  Administered 2024-06-09 (×2): 2 g via INTRAVENOUS
  Filled 2024-06-08 (×2): qty 100

## 2024-06-08 MED ORDER — FAMOTIDINE 20 MG PO TABS
40.0000 mg | ORAL_TABLET | Freq: Every day | ORAL | Status: DC
Start: 2024-06-08 — End: 2024-06-09
  Administered 2024-06-08: 40 mg via ORAL
  Filled 2024-06-08: qty 2

## 2024-06-08 SURGICAL SUPPLY — 94 items
BAG COUNTER SPONGE SURGICOUNT (BAG) ×6 IMPLANT
BIT DRILL CROWE POINT TWST 4.3 (DRILL) IMPLANT
BLADE CLIPPER SURG (BLADE) IMPLANT
BNDG COHESIVE 6X5 TAN ST LF (GAUZE/BANDAGES/DRESSINGS) IMPLANT
BRUSH SCRUB EZ PLAIN DRY (MISCELLANEOUS) ×6 IMPLANT
BUR 14 MATCH 3 (BUR) IMPLANT
BUR MR8 14 BALL 5 (BUR) IMPLANT
CANISTER SUCTION 3000ML PPV (SUCTIONS) ×3 IMPLANT
COVER PERINEAL POST (MISCELLANEOUS) ×3 IMPLANT
COVER SURGICAL LIGHT HANDLE (MISCELLANEOUS) ×6 IMPLANT
COVERAGE SUPPORT O-ARM STEALTH (MISCELLANEOUS) ×3 IMPLANT
DERMABOND ADVANCED .7 DNX12 (GAUZE/BANDAGES/DRESSINGS) IMPLANT
DRAPE C-ARM 42X72 X-RAY (DRAPES) ×9 IMPLANT
DRAPE C-ARMOR (DRAPES) ×3 IMPLANT
DRAPE IMP U-DRAPE 54X76 (DRAPES) ×3 IMPLANT
DRAPE LAPAROTOMY 100X72X124 (DRAPES) ×3 IMPLANT
DRAPE SHEET LG 3/4 BI-LAMINATE (DRAPES) ×12 IMPLANT
DRAPE SURG 17X23 STRL (DRAPES) ×3 IMPLANT
DRAPE SURG ORHT 6 SPLT 77X108 (DRAPES) ×6 IMPLANT
DRAPE U-SHAPE 47X51 STRL (DRAPES) ×3 IMPLANT
DRESSING MEPILEX FLEX 4X4 (GAUZE/BANDAGES/DRESSINGS) ×3 IMPLANT
DRILL RECON FTHRD 6.5 ZI (DRILL) IMPLANT
DRSG ADAPTIC 3X8 NADH LF (GAUZE/BANDAGES/DRESSINGS) ×3 IMPLANT
DRSG MEPILEX POST OP 4X8 (GAUZE/BANDAGES/DRESSINGS) ×3 IMPLANT
DRSG OPSITE POSTOP 3X4 (GAUZE/BANDAGES/DRESSINGS) IMPLANT
DRSG OPSITE POSTOP 4X8 (GAUZE/BANDAGES/DRESSINGS) IMPLANT
DURAPREP 26ML APPLICATOR (WOUND CARE) ×3 IMPLANT
ELECTRODE BLDE INSULATED 6.5IN (ELECTROSURGICAL) ×3 IMPLANT
ELECTRODE REM PT RTRN 9FT ADLT (ELECTROSURGICAL) ×6 IMPLANT
EVACUATOR 1/8 PVC DRAIN (DRAIN) IMPLANT
EXTENDER TAB GUIDE SV 5.5/6.0 (INSTRUMENTS) IMPLANT
FEE COVERAGE SUPPORT O-ARM (MISCELLANEOUS) ×3 IMPLANT
FEE SRVC MNAV LABOR AFTER HRS (MISCELLANEOUS) IMPLANT
FIBERTAPE CERCLAGE TLINK SUT (SUTURE) IMPLANT
GAUZE 4X4 16PLY ~~LOC~~+RFID DBL (SPONGE) IMPLANT
GAUZE PAD ABD 8X10 STRL (GAUZE/BANDAGES/DRESSINGS) ×12 IMPLANT
GAUZE SPONGE 4X4 12PLY STRL (GAUZE/BANDAGES/DRESSINGS) ×6 IMPLANT
GLOVE BIO SURGEON STRL SZ7 (GLOVE) ×12 IMPLANT
GLOVE BIO SURGEON STRL SZ7.5 (GLOVE) ×3 IMPLANT
GLOVE BIO SURGEON STRL SZ8 (GLOVE) ×3 IMPLANT
GLOVE BIOGEL PI IND STRL 7.5 (GLOVE) ×6 IMPLANT
GLOVE BIOGEL PI IND STRL 8 (GLOVE) ×9 IMPLANT
GLOVE ECLIPSE 7.5 STRL STRAW (GLOVE) IMPLANT
GLOVE ECLIPSE 8.0 STRL XLNG CF (GLOVE) ×3 IMPLANT
GLOVE SURG ORTHO LTX SZ7.5 (GLOVE) ×6 IMPLANT
GOWN STRL REUS W/ TWL LRG LVL3 (GOWN DISPOSABLE) ×12 IMPLANT
GOWN STRL REUS W/ TWL XL LVL3 (GOWN DISPOSABLE) ×9 IMPLANT
GOWN STRL REUS W/TWL 2XL LVL3 (GOWN DISPOSABLE) IMPLANT
GUIDEPIN VERSANAIL DSP 3.2X444 (ORTHOPEDIC DISPOSABLE SUPPLIES) IMPLANT
GUIDEWIRE BEAD TIP 100 ST (WIRE) IMPLANT
HEMOSTAT POWDER KIT SURGIFOAM (HEMOSTASIS) ×3 IMPLANT
KIT BASIN OR (CUSTOM PROCEDURE TRAY) ×6 IMPLANT
KIT TURNOVER KIT B (KITS) ×6 IMPLANT
MANIFOLD NEPTUNE II (INSTRUMENTS) ×3 IMPLANT
MARKER SPHERE PSV REFLC NDI (MISCELLANEOUS) ×15 IMPLANT
NAIL AFFIXUS PF RT 9X340 ST (Nail) IMPLANT
NDL HYPO 18GX1.5 BLUNT FILL (NEEDLE) IMPLANT
NDL HYPO 22X1.5 SAFETY MO (MISCELLANEOUS) ×3 IMPLANT
NEEDLE HYPO 18GX1.5 BLUNT FILL (NEEDLE) IMPLANT
NEEDLE HYPO 22X1.5 SAFETY MO (MISCELLANEOUS) ×3 IMPLANT
NS IRRIG 1000ML POUR BTL (IV SOLUTION) ×6 IMPLANT
PACK GENERAL/GYN (CUSTOM PROCEDURE TRAY) ×3 IMPLANT
PACK LAMINECTOMY NEURO (CUSTOM PROCEDURE TRAY) ×3 IMPLANT
PACK TOTAL JOINT (CUSTOM PROCEDURE TRAY) ×3 IMPLANT
PACK UNIVERSAL I (CUSTOM PROCEDURE TRAY) ×3 IMPLANT
PAD ARMBOARD POSITIONER FOAM (MISCELLANEOUS) ×15 IMPLANT
PASSER CERCLAGE OFFSET LT (ORTHOPEDIC DISPOSABLE SUPPLIES) IMPLANT
PIN BONE FIX 100 (PIN) IMPLANT
ROD PERC STRT 5.5X80 (Rod) IMPLANT
SCREW 5.5X40MM SOLERA VOYAGER (Screw) IMPLANT
SCREW CORT AFFIX ST 5X38 (Screw) IMPLANT
SCREW CORTICAL BONE 5X36 ST (Screw) IMPLANT
SCREW FULL THD 6.5X75 ST (Screw) IMPLANT
SCREW MAS VOYAGER 4.5X35 (Screw) IMPLANT
SCREW MAS VOYAGER 4.5X40 (Screw) IMPLANT
SCREW MAS VOYAGER 5.5X45 (Screw) IMPLANT
SCREW SET 5.5/6.0MM SOLERA (Screw) IMPLANT
SCREW SOLID FULL THD 6.5X85 ST (Screw) IMPLANT
SERVICE MNAV LABOR AFTER HRS (MISCELLANEOUS) ×3 IMPLANT
SPIKE FLUID TRANSFER (MISCELLANEOUS) ×3 IMPLANT
SPONGE SURGIFOAM ABS GEL SZ50 (HEMOSTASIS) IMPLANT
SPONGE T-LAP 18X18 ~~LOC~~+RFID (SPONGE) ×3 IMPLANT
SPONGE T-LAP 4X18 ~~LOC~~+RFID (SPONGE) IMPLANT
STAPLER SKIN PROX 35W (STAPLE) ×3 IMPLANT
SUT ETHILON 2 0 FS 18 (SUTURE) ×3 IMPLANT
SUT MNCRL AB 4-0 PS2 18 (SUTURE) ×3 IMPLANT
SUT VIC AB 0 CT1 27XBRD ANBCTR (SUTURE) ×3 IMPLANT
SUT VIC AB 1 CT1 27XBRD ANBCTR (SUTURE) ×3 IMPLANT
SUT VIC AB 1 CT1 27XBRD ANTBC (SUTURE) ×12 IMPLANT
SUT VIC AB 2-0 CP2 18 (SUTURE) ×3 IMPLANT
SUT VIC AB 2-0 CT1 TAPERPNT 27 (SUTURE) ×3 IMPLANT
TOWEL GREEN STERILE (TOWEL DISPOSABLE) ×9 IMPLANT
TOWEL GREEN STERILE FF (TOWEL DISPOSABLE) ×6 IMPLANT
WATER STERILE IRR 1000ML POUR (IV SOLUTION) ×12 IMPLANT

## 2024-06-08 NOTE — Progress Notes (Signed)
 As noted in my attestation of [facility] consultation nearby, Kimberly [name] and I have discussed at length her injuries, plans for left femur repair, and her post surgical rehabilitation.   Dr. Debby and I have coordinated her care for the OR today.  Consent obtained.   Ozell Bruch, MD [facility], [facility] [phone]

## 2024-06-08 NOTE — ED Notes (Addendum)
 Trauma Event Note    Nonactivated Trauma --   Pt is alert/oriented x 4, w/d, will be admitted to trauma service. Hospital bed ordered, Ortho tech consulted- will place [employer] when bed has arrived.   [name], PA at bedside currently.  Decreased sensation on left, worse decreased sensation on right - per PA's exam-- C-Collar to remain on.   0845 NSG NP at bedside --  Last imported Vital Signs BP 125/86 (BP Location: Right Arm)   Pulse 60   Temp (!) 97.5 F (36.4 C) (Oral)   Resp (!) 22   Ht 5' (1.524 m)   Wt 130 lb (59 kg)   SpO2 100%   BMI 25.39 kg/m   Trending CBC Recent Labs    06/08/24 0529  WBC 7.0  HGB 11.6*  HCT 34.2*  PLT 136*    Trending Coag's Recent Labs    06/08/24 0529  INR 1.2    Trending BMET Recent Labs    06/08/24 0529  NA 138  K 3.6  CL 104  CO2 22  BUN 14  CREATININE 1.07*  GLUCOSE 108*      Kimberly [name]  Trauma Response RN  Please call TRN at [phone] for further assistance.

## 2024-06-08 NOTE — Progress Notes (Addendum)
 Orthopedic Tech Progress Note Patient Details:  Kimberly [name] 24-Jan-1956 [id number]  Patient ID: Kimberly [name], female   DOB: 1956-06-12, 68 y.o.   MRN: [id number] Patient no longer needed for traction. Will bring TLSO from [facility].  Kimberly [name] 06/08/2024, 11:34 PM

## 2024-06-08 NOTE — Progress Notes (Signed)
 Transition of Care [facility]) - CAGE-AID Screening   Patient Details  Name: Kimberly [name] MRN: [id number] Date of Birth: [birth year]    Darice CHRISTELLA Rouleau, RN Trauma Response Nurse Phone Number: [phone] 06/08/2024, 8:15 AM     CAGE-AID Screening:    Have You Ever Felt You Ought to Cut Down on Your Drinking or Drug Use?: No Have People Annoyed You By Critizing Your Drinking Or Drug Use?: No Have You Felt Bad Or Guilty About Your Drinking Or Drug Use?: No Have You Ever Had a Drink or Used Drugs First Thing In The Morning to Steady Your Nerves or to Get Rid of a Hangover?: No CAGE-AID Score: 0  Substance Abuse Education Offered: (S) No (denies substance or alcohol use)

## 2024-06-08 NOTE — Consult Note (Signed)
 Reason for Consult:Left femur fx Referring Physician: Alm Lias Time called: 0730 Time at bedside: 0856   Kimberly [name] is an 68 y.o. female.  HPI: Sharna was trying to go to the bathroom when she got confused and fell. She had immediate hip and back pain and could not get up. She was brought to the ED where x-rays showed a left femur fx in addition to some vertebral fxs and orthopedic surgery was consulted. She lives at home with her husband and ambulates with a cane when she's out of the house.  Past Medical History:  Diagnosis Date   Anemia    PMH: as a child   Arthritis    Asthma    CAD (coronary artery disease)    nonobstructive by cath, 6/08 (false positive Cardiolite ) normal stress echo, 7/11   CHF (congestive heart failure) (HCC)    Chronic back pain    Chronic bronchitis (HCC)    Cirrhosis (HCC)    Complication of anesthesia    had an asthma attack when woke up from procedure   Complication of anesthesia    asthma attack with one surgery   COPD (chronic obstructive pulmonary disease) (HCC)    Degenerative joint disease    Depression    DJD (degenerative joint disease)    Fatty liver    Fibromyalgia    GERD (gastroesophageal reflux disease)    Glaucoma    Headache(784.0)    Heart failure, diastolic, chronic (HCC)    Heart murmur    PMH:As a child only   Hernia of abdominal cavity    upper and lower hernia   History of hiatal hernia    HTN (hypertension)    Hypertension    Hypothyroidism    IBS (irritable bowel syndrome)    Morbid obesity (HCC)    Neuropathy    associated with diabetes   Obstructive sleep apnea    Pancreatitis    Pinched nerve in neck    Pneumonia    as a child   PONV (postoperative nausea and vomiting)    Pulmonary hypertension (HCC)    Rheumatic fever    PMH: as a child   Seizures (HCC)    PMH: only as a child   Seizures (HCC)    in childhood   Shortness of breath    Sleep apnea    Spinal stenosis    Stroke (HCC)     12/09/2013    Past Surgical History:  Procedure Laterality Date   ABDOMINAL HYSTERECTOMY     ABDOMINAL HYSTERECTOMY     partial hysterectomy   APPENDECTOMY     BACK SURGERY     BACK SURGERY     disc surgery with complications and damage to right side   BARIATRIC SURGERY     DS in June 2020   BILATERAL KNEE ARTHROSCOPY  2000, 2009   BIOPSY  06/16/2021   Procedure: BIOPSY;  Surgeon: Eartha Angelia Sieving, MD;  Location: [facility];  Service: Gastroenterology;;   BREAST CYST EXCISION Left    CARDIAC CATHETERIZATION     2009   CATARACT EXTRACTION W/ INTRAOCULAR LENS  IMPLANT, BILATERAL     CHOLECYSTECTOMY  11/22/1992   CHOLECYSTECTOMY     COLONOSCOPY N/A 08/01/2013   Procedure: COLONOSCOPY;  Surgeon: Claudis RAYMOND Rivet, MD;  Location: [facility];  Service: Endoscopy;  Laterality: N/A;  930   COLONOSCOPY N/A 08/03/2017   Procedure: COLONOSCOPY;  Surgeon: Rivet Claudis RAYMOND, MD;  Location: [facility];  Service: Endoscopy;  Laterality: N/A;  12:00   COLONOSCOPY WITH PROPOFOL  N/A 06/28/2023   Procedure: COLONOSCOPY WITH PROPOFOL ;  Surgeon: Eartha Angelia Sieving, MD;  Location: [facility];  Service: Gastroenterology;  Laterality: N/A;  12:30pm;asa 3   CYST EXCISION     Left breast   ESOPHAGOGASTRODUODENOSCOPY (EGD) WITH PROPOFOL  N/A 06/16/2021   Procedure: ESOPHAGOGASTRODUODENOSCOPY (EGD) WITH PROPOFOL ;  Surgeon: Eartha Angelia Sieving, MD;  [facility]: [facility];  Service: Gastroenterology;  Laterality: N/A;  12:50   ESOPHAGOGASTRODUODENOSCOPY (EGD) WITH PROPOFOL  N/A 06/28/2023   Procedure: ESOPHAGOGASTRODUODENOSCOPY (EGD) WITH PROPOFOL ;  Surgeon: Eartha Angelia Sieving, MD;  [facility]: [facility];  Service: Gastroenterology;  Laterality: N/A;  12:30pm;asa 3   EXCISIONAL HEMORRHOIDECTOMY     EYE SURGERY  11/22/1965   EYE SURGERY     bilateral cataract removal with lens implants   EYE SURGERY     growth removed from right eye lid   EYE SURGERY     bilateral eye  surgery age 73 tto straighten eyes'   heel (other)  11/23/2003   HEEL SPUR SURGERY     Right heel - growth removal and rebuilding of heel   HEMORRHOID SURGERY     HERNIA REPAIR     KNEE ARTHROSCOPY Bilateral    LUMBAR LAMINECTOMY/DECOMPRESSION MICRODISCECTOMY Right 08/23/2014   Procedure: LUMBAR LAMINECTOMY/DECOMPRESSION MICRODISCECTOMY 1 LEVEL;  Surgeon: Victory DELENA Gunnels, MD;  Location: [facility];  Service: Neurosurgery;  Laterality: Right;  LUMBAR LAMINECTOMY/DECOMPRESSION MICRODISCECTOMY 1 LEVEL LUMBAR 2-3   ovaries removed  11/22/2001   PARTIAL HYSTERECTOMY  11/23/1979   SHOULDER ARTHROSCOPY WITH DISTAL CLAVICLE RESECTION Left 08/06/2022   Procedure: SHOULDER ARTHROSCOPY WITH DISTAL CLAVICLE RESECTION;  Surgeon: Sharl Selinda Dover, MD;  Location: [facility] OR;  Service: Orthopedics;  Laterality: Left;   SHOULDER ARTHROSCOPY WITH ROTATOR CUFF REPAIR Left 08/06/2022   Procedure: SHOULDER ARTHROSCOPY WITH ROTATOR CUFF REPAIR;  Surgeon: Sharl Selinda Dover, MD;  Location: [facility] OR;  Service: Orthopedics;  Laterality: Left;   SHOULDER ARTHROSCOPY WITH SUBACROMIAL DECOMPRESSION Left 08/06/2022   Procedure: SHOULDER ARTHROSCOPY WITH SUBACROMIAL DECOMPRESSIONWITH EXTENSIVE DEBRIDEMENT;  Surgeon: Sharl Selinda Dover, MD;  Location: [facility] OR;  Service: Orthopedics;  Laterality: Left;   SPHINCTEROTOMY     spinahatomy  11/22/1990   TOTAL KNEE ARTHROPLASTY Right 11/05/2020   Procedure: RIGHT TOTAL KNEE ARTHROPLASTY;  Surgeon: Barbarann Oneil BROCKS, MD;  Location: [facility];  Service: Orthopedics;  Laterality: Right;   TUBAL LIGATION  11/22/1973   TUBAL LIGATION      Family History  Problem Relation Age of Onset   Colon cancer Brother    Cancer Other    Heart failure Other    Breast cancer Neg Hx     Social History:  reports that she has never smoked. She has never been exposed to tobacco smoke. She has never used smokeless tobacco. She reports that she does not drink alcohol and does not use  drugs.  Allergies:  Allergies  Allergen Reactions   Cinnamon Anaphylaxis    Denies allergy on 08/06/22, states It was an interaction when mixed w/another med I no longer take   Hydrocodone  Other (See Comments)    Over sedation   Naproxen Anaphylaxis and Other (See Comments)    Throat swelling   Other Shortness Of Breath and Other (See Comments)    Local anesthesia Had asthma attack  08/06/22 tolerated amide local anesthetic in peripheral nerve block.   Oxycodone  Other (See Comments)    Over sedation   Feldene [Piroxicam]  Nausea Only   Lyrica [Pregabalin] Other (See Comments)    Altered mental status for prolonged period   Nsaids     Avoid due to history of gastic bypass   Phenergan [Promethazine Hcl] Other (See Comments)    triggers asthma attacks   Sulfa Antibiotics Other (See Comments)    (Yeast infection, per patient.)   Latex Rash and Other (See Comments)    Blistery rash.    Tape Rash and Other (See Comments)    Do not use adhesive tape; okay to use paper tape. 08/06/22 tolerated adhesive tape for peripheral IV    Medications: I have reviewed the patient's current medications.  Results for orders placed or performed during the hospital encounter of 06/08/24 (from the past 48 hours)  Basic metabolic panel     Status: Abnormal   Collection Time: 06/08/24  5:29 AM  Result Value Ref Range   Sodium 138 135 - 145 mmol/L   Potassium 3.6 3.5 - 5.1 mmol/L   Chloride 104 98 - 111 mmol/L   CO2 22 22 - 32 mmol/L   Glucose, Bld 108 (H) 70 - 99 mg/dL    Comment: Glucose reference range applies only to samples taken after fasting for at least 8 hours.   BUN 14 8 - 23 mg/dL   Creatinine, Ser 8.92 (H) 0.44 - 1.00 mg/dL   Calcium  8.5 (L) 8.9 - 10.3 mg/dL   GFR, Estimated 57 (L) >60 mL/min    Comment: (NOTE) Calculated using the CKD-EPI Creatinine Equation (2021)    Anion gap 12 5 - 15    Comment: Performed at [facility], [address], [place], [place] [address]   CBC with Differential     Status: Abnormal   Collection Time: 06/08/24  5:29 AM  Result Value Ref Range   WBC 7.0 4.0 - 10.5 K/uL   RBC 3.35 (L) 3.87 - 5.11 MIL/uL   Hemoglobin 11.6 (L) 12.0 - 15.0 g/dL   HCT 65.7 (L) 63.9 - 53.9 %   MCV 102.1 (H) 80.0 - 100.0 fL   MCH 34.6 (H) 26.0 - 34.0 pg   MCHC 33.9 30.0 - 36.0 g/dL   RDW 86.2 88.4 - 84.4 %   Platelets 136 (L) 150 - 400 K/uL   nRBC 0.0 0.0 - 0.2 %   Neutrophils Relative % 77 %   Neutro Abs 5.3 1.7 - 7.7 K/uL   Lymphocytes Relative 17 %   Lymphs Abs 1.2 0.7 - 4.0 K/uL   Monocytes Relative 6 %   Monocytes Absolute 0.4 0.1 - 1.0 K/uL   Eosinophils Relative 0 %   Eosinophils Absolute 0.0 0.0 - 0.5 K/uL   Basophils Relative 0 %   Basophils Absolute 0.0 0.0 - 0.1 K/uL   Immature Granulocytes 0 %   Abs Immature Granulocytes 0.02 0.00 - 0.07 K/uL    Comment: Performed at [facility], [address], [place], [place] [address]  Protime-INR     Status: Abnormal   Collection Time: 06/08/24  5:29 AM  Result Value Ref Range   Prothrombin Time 15.6 (H) 11.4 - 15.2 seconds   INR 1.2 0.8 - 1.2    Comment: (NOTE) INR goal varies based on device and disease states. Performed at [facility], [address] [place], [place] [address]   Type and screen [facility]     Status: None   Collection Time: 06/08/24  6:00 AM  Result Value Ref Range  ABO/RH(D) A POS    Antibody Screen NEG    Sample Expiration      06/11/2024,2359 Performed at [facility], [address], [place], [place] [address]     CT T-SPINE NO CHARGE Addendum Date: 06/08/2024 ADDENDUM REPORT: 06/08/2024 08:07 ADDENDUM: Correction, Impression #1 should read:  ACUTE CHANCE type posttraumatic fracture. Electronically Signed   By: VEAR Hurst M.D.   On: 06/08/2024 08:07   Addendum Date: 06/08/2024 ADDENDUM REPORT: 06/08/2024 07:54 ADDENDUM: Study discussed by telephone with Dr. ALM LIAS on 06/08/2024 at 0733 hours. Electronically  Signed   By: VEAR Hurst M.D.   On: 06/08/2024 07:54   Result Date: 06/08/2024 CLINICAL DATA:  68 year old female status post fall at home. Left femur fracture. Pain. EXAM: CT THORACIC SPINE WITHOUT CONTRAST TECHNIQUE: Multiplanar CT images of the thoracic spine were reconstructed from contemporary CT of the Chest. RADIATION DOSE REDUCTION: This exam was performed according to the departmental dose-optimization program which includes automated exposure control, adjustment of the mA and/or kV according to patient size and/or use of iterative reconstruction technique. CT cervical spine, CONTRAST:  None COMPARISON:  CT Chest, Abdomen, and Pelvis today are reported separately. Chest CT 11/26/2023. FINDINGS: Limited cervical spine imaging: Cervical spine reported separately. Cervicothoracic junction alignment is within normal limits. Thoracic spine segmentation:  Normal, hypoplastic ribs at T12. Alignment: Progressed kyphosis at T12 secondary to acute fracture deformity there. Stable straightening of thoracic kyphosis elsewhere. Mild underlying chronic dextroconvex thoracic scoliosis. Vertebrae: Hyperostosis related thoracic interbody ankylosis, from T3 through T4 via posterior element ankylosis, from T4 through T9 via anterior flowing endplate osteophytes, and from T11 through the thoracolumbar junction via posterior element ankylosis. Osteopenia. Superimposed acute Chance type fracture through the T11-T12 ankylosed level. Horizontal, mildly oblique fracture lucency traversing the chronically ankylosed posterior elements there including the T11 spinous process (series 1, image 33 and series 2, images 43 and 44). The T11 pedicles and body appear stable and intact. Acute on chronic T12 superior endplate compression fracture. Roughly 25% acute loss of T12 vertebral body height since January. Mild retropulsion of the posterosuperior endplate is new. The bilateral T12 posterior elements are fractured but nondisplaced. Other  thoracic levels appears stable and intact. Lumbar spine reported separately. Paraspinal and other soft tissues: Mild T12 paravertebral soft tissue contusion or hematoma. Chest and abdomen reported separately. Disc levels: T12 posterosuperior endplate retropulsion is superimposed on bulky chronic posterior element hypertrophy and ligament flavum calcification/ossification. Combined spinal stenosis is moderate, 40% (series 1, image 33). Thoracic spinal ankylosis above that level. IMPRESSION: 1. Acute on Chance type posttraumatic fracture of the ankylosed spine at T11-T12. Nondisplaced posterior element fractures. Associated acute on chronic T12 vertebral body compression (25%) with mild retropulsion of bone contributing to moderate multifactorial spinal stenosis there (in part degenerative). Recommend Spine Surgery consultation. 2. Underlying Thoracic hyperostosis related ankylosis from T3 through the thoracolumbar junction. 3. Lumbar spine, Chest, abdomen, and pelvis reported separately. Electronically Signed: By: VEAR Hurst M.D. On: 06/08/2024 07:29   CT L-SPINE NO CHARGE Result Date: 06/08/2024 CLINICAL DATA:  68 year old female status post fall at home. Left femur fracture. Pain. EXAM: CT LUMBAR SPINE WITHOUT CONTRAST TECHNIQUE: Technique: Multiplanar CT images of the lumbar spine were reconstructed from contemporary CT of the Abdomen and Pelvis. RADIATION DOSE REDUCTION: This exam was performed according to the departmental dose-optimization program which includes automated exposure control, adjustment of the mA and/or kV according to patient size and/or use of iterative reconstruction technique. CONTRAST:  None COMPARISON:  CT Chest, Abdomen, and Pelvis today reported separately. CT Abdomen and Pelvis 11/26/2023. FINDINGS: Segmentation: Transitional anatomy with sacralized L5 level. Correlation with radiographs is recommended prior to any operative intervention. This is concordant with the thoracic numbering  today. Alignment: Stable since January lumbar lordosis, mild dextroconvex lumbar scoliosis. Vertebrae: T11-T12 fracture through the ankylosed spine is reported separately today. Superimposed T12-L1 ankylosis via the posterior elements. No definite L1-L2 or L2-L3 ankylosis at this time. But L3 through sacral ankylosis via the posterior elements. Lumbar vertebrae appear stable. Mild chronic L4 superior endplate compression appears unchanged. Sacrum is detailed separately today. Paraspinal and other soft tissues: Abdomen and pelvis are reported separately. Lumbar paraspinal soft tissues remain normal. Disc levels: Stable lumbar spine degeneration from the January CT Abdomen and Pelvis. IMPRESSION: 1. Transitional anatomy with sacralized L5 level. T11-T12 fracture through the ankylosed spine is reported separately today. 2. No acute traumatic injury identified in the Lumbar Spine. Abnormal CT Abdomen and Pelvis reported separately. Electronically Signed   By: VEAR Hurst M.D.   On: 06/08/2024 07:37   CT CHEST ABDOMEN PELVIS WO CONTRAST Result Date: 06/08/2024 CLINICAL DATA:  68 year old female status post fall at home. Left femur fracture. Pain. EXAM: CT CHEST, ABDOMEN AND PELVIS WITHOUT CONTRAST TECHNIQUE: Multidetector CT imaging of the chest, abdomen and pelvis was performed following the standard protocol without IV contrast. RADIATION DOSE REDUCTION: This exam was performed according to the departmental dose-optimization program which includes automated exposure control, adjustment of the mA and/or kV according to patient size and/or use of iterative reconstruction technique. COMPARISON:  CT thoracic and lumbar spine today reported separately. CT Chest, Abdomen, and Pelvis 11/26/2023. FINDINGS: CT CHEST FINDINGS Cardiovascular: Vascular patency is not evaluated in the absence of IV contrast. Stable cardiac size. No pericardial effusion. Mild for age Calcified aortic atherosclerosis. Mediastinum/Nodes: Small to  moderate gastric hiatal hernia with superimposed postoperative changes to the stomach. Stable since January. No evidence of mediastinal hematoma, mass, lymphadenopathy in the absence of IV contrast. Lungs/Pleura: Mildly lower lung volumes. Major airways remain patent. Symmetric bilateral lower lobe peribronchial and dependent opacity most resembles atelectasis. No pneumothorax. No pleural effusion. No consolidation. No convincing pulmonary contusion. Stable 3 4 mm right lower lobe lung nodule series 5, image 79 (no follow-up imaging recommended). Musculoskeletal: Abnormal thoracic spine is reported separately today. Osteopenia. Occasional chronic anterior rib fractures on the right. Visible shoulder osseous structures, sternum appear intact. No acute rib fracture identified. CT ABDOMEN PELVIS FINDINGS Hepatobiliary: Chronic cholecystectomy. Negative noncontrast liver. No perihepatic fluid is identified. Pancreas: Negative noncontrast pancreas. Spleen: Negative noncontrast spleen. No perisplenic fluid is identified. Adrenals/Urinary Tract: Noncontrast adrenal glands and kidneys appears stable and intact. No hydronephrosis or hydroureter. Mildly distended, unremarkable urinary bladder. Chronic pelvic phleboliths. Stomach/Bowel: Mild mass effect on the rectum from presacral pelvic hematoma, detailed below. Otherwise the rectum and distal large bowel appear negative, retained stool. Redundant transverse colon, splenic flexure. Mildly redundant right colon, cecum on a lax mesentery near the midline. Appendix not identified. Chronic anastomosis in the right abdomen appears to be small bowel related. Small volume of oral contrast in bowel loops in that area. No dilated small bowel. Chronic postoperative changes throughout the greater curve of the stomach. The stomach is decompressed. Duodenum is decompressed. No pneumoperitoneum or free fluid identified. Vascular/Lymphatic: Vascular patency is not evaluated in the absence  of IV contrast. Aortoiliac calcified atherosclerosis. Normal caliber abdominal aorta. No lymphadenopathy identified. Reproductive: Chronically diminutive or absent. Other: Deep pelvic, presacral hematoma which is  described below. No superimposed pelvis free fluid. No acute superficial soft tissue injury identified, mild left lower quadrant abdominal wall contusion was present in January. Musculoskeletal: Lumbar spine detailed separately. Sacrum, SI joints, pelvis, and proximal right femur appears stable and intact. Highly comminuted proximal left femur with displaced and angulated comminution fragments beginning at the intertrochanteric segment again noted. Associated left thigh intramuscular hematoma (series 3, image 121). Additionally, there is a moderate volume of lobulated and heterogeneous presacral hematoma in the deep pelvis, which is asymmetric to the right (series 3, images 102 and 107 encompassing 30 x 64 x 55 mm (AP by transverse by CC), volume estimated at 50 mL. IMPRESSION: 1. Abnormal Thoracic spine CT, lumbar CT reported separately. 2. Presacral hematoma eccentric to the right in the deep pelvis, estimated 50 mL volume. Sacral osteopenia. No sacral or pelvic fracture is identified, but this finding is suspicious for occult sacral fracture. 3. Highly comminuted, displaced and angulated proximal Left femur fracture. Associated left thigh intramuscular hematoma. 4. No other acute traumatic injury identified in the noncontrast chest, abdomen, or pelvis. 5.  Aortic Atherosclerosis (ICD10-I70.0). Electronically Signed   By: VEAR Hurst M.D.   On: 06/08/2024 07:13   CT HEAD WO CONTRAST Result Date: 06/08/2024 CLINICAL DATA:  68 year old female status post fall at home. Left femur fracture. Pain. EXAM: CT HEAD WITHOUT CONTRAST TECHNIQUE: Contiguous axial images were obtained from the head CT 11/26/2023. Base of the skull through the vertex without intravenous contrast. RADIATION DOSE REDUCTION: This exam was  performed according to the departmental dose-optimization program which includes automated exposure control, adjustment of the mA and/or kV according to patient size and/or use of iterative reconstruction technique. COMPARISON:  None Available. FINDINGS: Brain: Normal cerebral volume. No midline shift, ventriculomegaly, mass effect, evidence of mass lesion, intracranial hemorrhage or evidence of cortically based acute infarction. Patchy and confluent periatrial white matter hypodensity is asymmetric, stable. Gray-white differentiation stable and otherwise within normal limits. Vascular: No suspicious intracranial vascular hyperdensity. Calcified atherosclerosis at the skull base. Skull: Hyperostosis of the calvarium and osteopenia again noted. Chronic bilateral nasal bone fractures appear stable. No acute osseous abnormality identified. Sinuses/Orbits: Regressed left maxillary retention cysts since January. Other Visualized paranasal sinuses and mastoids are stable and well aerated. Other: No acute orbit or scalp soft tissue injury identified. IMPRESSION: 1. No acute traumatic injury identified. 2. Stable non contrast CT appearance of chronic cerebral white matter disease. 3. Chronic nasal bone fractures. Electronically Signed   By: VEAR Hurst M.D.   On: 06/08/2024 06:57   CT Cervical Spine Wo Contrast Result Date: 06/08/2024 CLINICAL DATA:  68 year old female status post fall at home. Left femur fracture. Pain. EXAM: CT CERVICAL SPINE WITHOUT CONTRAST TECHNIQUE: Multidetector CT imaging of the cervical spine was performed without intravenous contrast. Multiplanar CT image reconstructions were also generated. RADIATION DOSE REDUCTION: This exam was performed according to the departmental dose-optimization program which includes automated exposure control, adjustment of the mA and/or kV according to patient size and/or use of iterative reconstruction technique. COMPARISON:  Head CT today.  Cervical spine CT  11/26/2023. FINDINGS: Alignment: Stable lordosis. Cervicothoracic junction alignment is within normal limits. Bilateral posterior element alignment is within normal limits. Skull base and vertebrae: Chronic osteopenia. Visualized skull base is intact. No atlanto-occipital dissociation. C1 and C2 appear intact and aligned. No acute osseous abnormality identified. Soft tissues and spinal canal: No prevertebral fluid or swelling. No visible canal hematoma. Negative for age visible noncontrast neck soft tissues. Disc levels: Chronic  right side facet arthropathy in the mid cervical spine. Bulky chronic endplate spurring C5 through C7. Associated bulky chronic leftward C6-C7 disc and endplate degeneration. But otherwise capacious CT appearance of the spinal canal. Upper chest: Osteopenia. Visible upper thoracic levels appear intact. Negative lung apices. IMPRESSION: 1. No acute traumatic injury identified in the cervical spine. 2. Osteopenia.  Chronic cervical spine degeneration. Electronically Signed   By: VEAR Hurst M.D.   On: 06/08/2024 06:21   DG FEMUR MIN 2 VIEWS LEFT Result Date: 06/08/2024 CLINICAL DATA:  68 year old female status post fall at home.  Pain. EXAM: LEFT FEMUR 2 VIEWS COMPARISON:  None Available. FINDINGS: Four views of the left femur 0542 hours. Long segment spiral and comminuted left femur fracture beginning at the intertrochanteric segment and continuing distally a distance of about 15 cm. Large medial butterfly fragment displaced medially more than 1 full shaft width. Left femoral head appears to remain normally located. Grossly intact visible left hemipelvis. Distal left femur, alignment at the left knee appear intact. Tricompartmental left knee joint degeneration. Grossly intact visible proximal tibia and fibula. IMPRESSION: Long segment spiral and comminuted left femur fracture from the intertrochanteric segment continuing 15 cm distally into the midshaft. Large and medially displaced  comminution fragment. Electronically Signed   By: VEAR Hurst M.D.   On: 06/08/2024 05:53    Review of Systems  HENT:  Negative for ear discharge, ear pain, hearing loss and tinnitus.   Eyes:  Negative for photophobia and pain.  Respiratory:  Negative for cough and shortness of breath.   Cardiovascular:  Negative for chest pain.  Gastrointestinal:  Negative for abdominal pain, nausea and vomiting.  Genitourinary:  Negative for dysuria, flank pain, frequency and urgency.  Musculoskeletal:  Positive for arthralgias (Left hip) and back pain. Negative for myalgias and neck pain.  Neurological:  Negative for dizziness and headaches.  Hematological:  Does not bruise/bleed easily.  Psychiatric/Behavioral:  The patient is not nervous/anxious.    Blood pressure 111/67, pulse 80, temperature (!) 97.5 F (36.4 C), temperature source Oral, resp. rate 20, height 5' (1.524 m), weight 59 kg, SpO2 100%. Physical Exam Constitutional:      General: She is not in acute distress.    Appearance: She is well-developed. She is not diaphoretic.  HENT:     Head: Normocephalic and atraumatic.  Eyes:     General: No scleral icterus.       Right eye: No discharge.        Left eye: No discharge.     Conjunctiva/sclera: Conjunctivae normal.  Cardiovascular:     Rate and Rhythm: Normal rate and regular rhythm.  Pulmonary:     Effort: Pulmonary effort is normal. No respiratory distress.  Musculoskeletal:     Cervical back: Normal range of motion.     Comments: LLE No traumatic wounds, ecchymosis, or rash  Mod TTP hip  No knee or ankle effusion  Sens DPN intact, SPN, TN paresthetic  Motor EHL, ext, flex, evers 5/5  DP 1+, PT 0, No significant edema  Skin:    General: Skin is warm and dry.  Neurological:     Mental Status: She is alert.  Psychiatric:        Mood and Affect: Mood normal.        Behavior: Behavior normal.     Assessment/Plan: Left femur fx -- Plan IMN today, maybe in conjunction with NS  procedure. Please keep NPO.    Ozell DOROTHA Ned, PA-C Orthopedic Surgery  [phone] 06/08/2024, 9:02 AM

## 2024-06-08 NOTE — Consult Note (Signed)
 HPI:     Patient is a 68 y.o. female presented to ED after fall in her bathroom this AM. Currently c/o BLE pain and cramping, as well as BUE tingling and pain. No SA, B&B dysfunction. She has a PMH notable for CAD, CHF, COPD, Cirrhosis, Fibromyalgia, HTN, HLD, Hypothyroidism, OSA, Pulm HTN, Seizures, CVA, DM. Not currently anticoagulated.     Patient Active Problem List   Diagnosis Date Noted   Thoracic spine fracture (HCC) 06/08/2024   Copper  deficiency 04/30/2024   Vitamin B12 deficiency 04/30/2024   RUQ pain 07/19/2023   Family history of colon cancer 06/28/2023   Closed fracture of head of right humerus 03/23/2023   Blood in stool 02/12/2023   Closed fracture of right proximal humerus 02/11/2023   Left patella fracture 02/11/2023   Accidental fall 02/11/2023   GI bleed 02/11/2023   Nasal fracture 02/11/2023   Thrombocytopenia (HCC) 02/11/2023   History of cirrhosis 02/11/2023   Hemorrhoids 01/17/2023   Gastroesophageal reflux disease without esophagitis 01/17/2023   Diarrhea 05/17/2022   Lower abdominal pain 05/17/2022   Nausea without vomiting 05/17/2022   Unilateral primary osteoarthritis, left knee 02/18/2022   IBS (irritable bowel syndrome) 06/08/2021   Chronic diarrhea 06/08/2021   Irritable bowel syndrome with diarrhea 06/08/2021   Quadriceps weakness 03/19/2021   S/P total knee arthroplasty, right 12/18/2020   Morbid obesity (HCC) 02/08/2018   Pancytopenia (HCC) 04/09/2017   Liver cirrhosis secondary to NASH (HCC) 04/09/2017   DM type 2 causing vascular disease (HCC) 04/06/2017   Essential hypertension 04/06/2017   OSA (obstructive sleep apnea) 04/06/2017   Asthma in adult 04/06/2017   Obesity (BMI 30-39.9) 04/06/2017   Primary osteoarthritis of both hands 10/26/2016   Primary osteoarthritis of both feet 10/26/2016   COPD (chronic obstructive pulmonary disease) (HCC) 09/27/2016   Acquired hypothyroidism 09/27/2016   Vertebral compression fracture (HCC)  09/27/2016   Fibromyalgia 09/27/2016   Restless leg syndrome 09/27/2016   Lumbar radiculitis 10/01/2014   Nerve pain 08/30/2014   Lumbar stenosis with neurogenic claudication 08/28/2014   Spondylosis of lumbar region without myelopathy or radiculopathy 08/23/2014   Lumbosacral stenosis with neurogenic claudication 08/23/2014   Vaginal discharge 06/22/2014   Chest pain 06/21/2014   Spinal stenosis 08/23/2011   Obstructive sleep apnea 08/23/2011   Diabetes (HCC) 08/12/2010   Coronary atherosclerosis 08/12/2010   Chronic diastolic heart failure (HCC) 08/12/2010   Mixed hyperlipidemia 06/23/2009   Overweight 06/23/2009   Essential hypertension, benign 06/23/2009   CHEST PAIN-UNSPECIFIED 06/23/2009   Past Medical History:  Diagnosis Date   Anemia    PMH: as a child   Arthritis    Asthma    CAD (coronary artery disease)    nonobstructive by cath, 6/08 (false positive Cardiolite ) normal stress echo, 7/11   CHF (congestive heart failure) (HCC)    Chronic back pain    Chronic bronchitis (HCC)    Cirrhosis (HCC)    Complication of anesthesia    had an asthma attack when woke up from procedure   Complication of anesthesia    asthma attack with one surgery   COPD (chronic obstructive pulmonary disease) (HCC)    Degenerative joint disease    Depression    DJD (degenerative joint disease)    Fatty liver    Fibromyalgia    GERD (gastroesophageal reflux disease)    Glaucoma    Headache(784.0)    Heart failure, diastolic, chronic (HCC)    Heart murmur    PMH:As a child only  Hernia of abdominal cavity    upper and lower hernia   History of hiatal hernia    HTN (hypertension)    Hypertension    Hypothyroidism    IBS (irritable bowel syndrome)    Morbid obesity (HCC)    Neuropathy    associated with diabetes   Obstructive sleep apnea    Pancreatitis    Pinched nerve in neck    Pneumonia    as a child   PONV (postoperative nausea and vomiting)    Pulmonary hypertension  (HCC)    Rheumatic fever    PMH: as a child   Seizures (HCC)    PMH: only as a child   Seizures (HCC)    in childhood   Shortness of breath    Sleep apnea    Spinal stenosis    Stroke (HCC)    12/09/2013    Past Surgical History:  Procedure Laterality Date   ABDOMINAL HYSTERECTOMY     ABDOMINAL HYSTERECTOMY     partial hysterectomy   APPENDECTOMY     BACK SURGERY     BACK SURGERY     disc surgery with complications and damage to right side   BARIATRIC SURGERY     DS in June 2020   BILATERAL KNEE ARTHROSCOPY  2000, 2009   BIOPSY  06/16/2021   Procedure: BIOPSY;  Surgeon: Eartha Angelia Sieving, MD;  Location: [facility];  Service: Gastroenterology;;   BREAST CYST EXCISION Left    CARDIAC CATHETERIZATION     2009   CATARACT EXTRACTION W/ INTRAOCULAR LENS  IMPLANT, BILATERAL     CHOLECYSTECTOMY  11/22/1992   CHOLECYSTECTOMY     COLONOSCOPY N/A 08/01/2013   Procedure: COLONOSCOPY;  Surgeon: Claudis RAYMOND Rivet, MD;  Location: [facility];  Service: Endoscopy;  Laterality: N/A;  930   COLONOSCOPY N/A 08/03/2017   Procedure: COLONOSCOPY;  Surgeon: Rivet Claudis RAYMOND, MD;  Location: [facility];  Service: Endoscopy;  Laterality: N/A;  12:00   COLONOSCOPY WITH PROPOFOL  N/A 06/28/2023   Procedure: COLONOSCOPY WITH PROPOFOL ;  Surgeon: Eartha Angelia Sieving, MD;  Location: [facility];  Service: Gastroenterology;  Laterality: N/A;  12:30pm;asa 3   CYST EXCISION     Left breast   ESOPHAGOGASTRODUODENOSCOPY (EGD) WITH PROPOFOL  N/A 06/16/2021   Procedure: ESOPHAGOGASTRODUODENOSCOPY (EGD) WITH PROPOFOL ;  Surgeon: Eartha Angelia Sieving, MD;  Location: [facility];  Service: Gastroenterology;  Laterality: N/A;  12:50   ESOPHAGOGASTRODUODENOSCOPY (EGD) WITH PROPOFOL  N/A 06/28/2023   Procedure: ESOPHAGOGASTRODUODENOSCOPY (EGD) WITH PROPOFOL ;  Surgeon: Eartha Angelia Sieving, MD;  Location: [facility];  Service: Gastroenterology;  Laterality: N/A;  12:30pm;asa 3   EXCISIONAL  HEMORRHOIDECTOMY     EYE SURGERY  11/22/1965   EYE SURGERY     bilateral cataract removal with lens implants   EYE SURGERY     growth removed from right eye lid   EYE SURGERY     bilateral eye surgery age 19 tto straighten eyes'   heel (other)  11/23/2003   HEEL SPUR SURGERY     Right heel - growth removal and rebuilding of heel   HEMORRHOID SURGERY     HERNIA REPAIR     KNEE ARTHROSCOPY Bilateral    LUMBAR LAMINECTOMY/DECOMPRESSION MICRODISCECTOMY Right 08/23/2014   Procedure: LUMBAR LAMINECTOMY/DECOMPRESSION MICRODISCECTOMY 1 LEVEL;  Surgeon: Victory DELENA Gunnels, MD;  Location: [facility];  Service: Neurosurgery;  Laterality: Right;  LUMBAR LAMINECTOMY/DECOMPRESSION MICRODISCECTOMY 1 LEVEL LUMBAR 2-3   ovaries removed  11/22/2001  PARTIAL HYSTERECTOMY  11/23/1979   SHOULDER ARTHROSCOPY WITH DISTAL CLAVICLE RESECTION Left 08/06/2022   Procedure: SHOULDER ARTHROSCOPY WITH DISTAL CLAVICLE RESECTION;  Surgeon: Sharl Selinda Dover, MD;  Location: [facility] OR;  Service: Orthopedics;  Laterality: Left;   SHOULDER ARTHROSCOPY WITH ROTATOR CUFF REPAIR Left 08/06/2022   Procedure: SHOULDER ARTHROSCOPY WITH ROTATOR CUFF REPAIR;  Surgeon: Sharl Selinda Dover, MD;  Location: [facility] OR;  Service: Orthopedics;  Laterality: Left;   SHOULDER ARTHROSCOPY WITH SUBACROMIAL DECOMPRESSION Left 08/06/2022   Procedure: SHOULDER ARTHROSCOPY WITH SUBACROMIAL DECOMPRESSIONWITH EXTENSIVE DEBRIDEMENT;  Surgeon: Sharl Selinda Dover, MD;  Location: [facility] OR;  Service: Orthopedics;  Laterality: Left;   SPHINCTEROTOMY     spinahatomy  11/22/1990   TOTAL KNEE ARTHROPLASTY Right 11/05/2020   Procedure: RIGHT TOTAL KNEE ARTHROPLASTY;  Surgeon: Barbarann Oneil BROCKS, MD;  Location: [facility];  Service: Orthopedics;  Laterality: Right;   TUBAL LIGATION  11/22/1973   TUBAL LIGATION      (Not in a hospital admission)  Allergies  Allergen Reactions   Cinnamon Anaphylaxis    Denies allergy on 08/06/22, states It was an interaction when mixed  w/another med I no longer take   Hydrocodone  Other (See Comments)    Over sedation   Naproxen Anaphylaxis and Other (See Comments)    Throat swelling   Other Shortness Of Breath and Other (See Comments)    Local anesthesia Had asthma attack  08/06/22 tolerated amide local anesthetic in peripheral nerve block.   Oxycodone  Other (See Comments)    Over sedation   Feldene [Piroxicam] Nausea Only   Lyrica [Pregabalin] Other (See Comments)    Altered mental status for prolonged period   Nsaids     Avoid due to history of gastic bypass   Phenergan [Promethazine Hcl] Other (See Comments)    triggers asthma attacks   Sulfa Antibiotics Other (See Comments)    (Yeast infection, per patient.)   Latex Rash and Other (See Comments)    Blistery rash.    Tape Rash and Other (See Comments)    Do not use adhesive tape; okay to use paper tape. 08/06/22 tolerated adhesive tape for peripheral IV    Social History   Tobacco Use   Smoking status: Never    Passive exposure: Never   Smokeless tobacco: Never  Substance Use Topics   Alcohol use: No    Family History  Problem Relation Age of Onset   Colon cancer Brother    Cancer Other    Heart failure Other    Breast cancer Neg Hx       Objective:   Patient Vitals for the past 8 hrs:  BP Temp Temp src Pulse Resp SpO2 Height Weight  06/08/24 0519 -- -- -- -- -- -- 5' (1.524 m) 59 kg  06/08/24 0518 125/86 (!) 97.5 F (36.4 C) Oral 60 (!) 22 100 % -- --   No intake/output data recorded. No intake/output data recorded.  Awake, alert, oriented Speech fluent, appropriate CN grossly intact PERRLA L femur fracture, LLE minimally tested. Will move foot at ankle. Sensation mildly decreased RLE 4/5, decreased sensation BUE 3/5 distally, 4/5 proximally, sensation intact   Assessment:   Principal Problem:   Thoracic spine fracture [facility])  This is a 68 yo female with underlying ankylosed spine with an acute T11/12 multi column Chance  type fracture as well as L femur fracture  Plan:   -MRI C/T/L spine stat -Likely OR this afternoon for stabilization of T11/12 fracture -NPO -Logroll precautions, bedrest.  Shirley Decamp CAYLIN Fany Cavanaugh, PA-C

## 2024-06-08 NOTE — Op Note (Signed)
 NAME: Kimberly [name] MEDICAL RECORD WN:[id number] DATE OF BIRTH:03/28/1956 PHYSICIAN:Vergene Marland H. Joni Norrod, MD  OPERATIVE REPORT  DATE OF PROCEDURE:  06/08/2024  PREOPERATIVE DIAGNOSIS:  LEFT FEMORAL SHAFT FRACTURE.  POSTOPERATIVE DIAGNOSIS:  LEFT FEMORAL SHAFT FRACTURE.  PROCEDURES: 1.  CEPHALOMEDULLARY NAILING OF THE LEFT FEMUR with Biomet Affixus 9 x 360  mm statically locked recon nail. 2.  OPEN REDUCTION INTERNAL FIXATION OF THE LEFT FEMUR  SURGEON:  Ozell Bruch, MD  ASSISTANT:  Francis Mt, PA-C  ANESTHESIA:  General.  COMPLICATIONS:  None.  EBL: 160 mL  TOURNIQUET:  None.  SPECIMENS:  None.    INS AND OUTS:  Please refer to the anesthetic record.  DISPOSITION:  To PACU.  CONDITION:  Stable.  CONTRAINDICATIONS TO DEEP VEIN THROMBOSIS PROPHYLAXIS:  None.  BRIEF SUMMARY AND INDICATION OF PROCEDURE:  Kimberly [name] is a 68 y.o. year-old with multiple medical problems who sustained severe injuries to her left femur and thoracic spine in a fall from the commode.  I coordinated care with Dr. Debby of Neurosurgery and then discussed with the patient risks and benefits of surgical treatment including the potential for malunion, nonunion, symptomatic hardware, heart attack, stroke, neurovascular injury, bleeding, and others.  After acknowledging these risks, consent was provided to proceed.  BRIEF SUMMARY OF PROCEDURE:  The patient was taken to the operating room where general anesthesia was induced, and then was positioned supine on the radiolucent table with a bump under the operative hip. Traction was used to maintain reduction while standard prep with chlorhexidene wash and then betadine scrub and paint was performed. After sterile drapes and time-out, a long instrument was used to identify the appropriate starting position under C-arm on both AP and lateral images.  A 3 cm incision was made proximal to the greater trochanter.  The curved cannulated awl was inserted and then  the starting guidewire advanced into the proximal femur through the piriformis fossa.  This was checked on AP and lateral views.  The starting reamer was engaged while protecting the soft tissue with a sleeve.   My assistant applied attraction and derotated the fracture to dial  in the reduction, but it could not be adequately produced and maintained below the entry.  Consequently a formal open reduction and internal fixation of the femur had to be performed. A 7 cm lateral incision was made and direct exposure through the IT band, vastus undertaken while the muscle was retracted anteriorly.  This was followed by careful passage of an Arthrex fibertape tensioned loop which compressed the fracture and completed reduction to interdigitate and compress the fracture fragments, establishing a near anatomic reduction except for one free posterior cortical piece which was more distal.  The curved ball-tipped guidewire was then inserted, making sure it safely crossed the fracture site on AP and LAT views and then stayed safely posterior in the distal femur. We sequentially reamed up to 10.5 mm, making sure the fracture remained reduced during reaming, and inserted a 9 x 340 mm nail to the appropriate depth.  The guidewire for the lag screws was then inserted in appropriate anteversion to make sure it was in a center-center position within the femoral head.  This was measured and the lag screws placed sequentially with excellent purchase and position as checked on both views.  I turned attention distally where two static locking bolts were placed using perfect circle technique.  Final images were obtained showing excellent reduction, with appropriate hardware placement, trajectory and length.  Francis Mt, PA-C  assisted throughout the case with establishment of reduction, reaming while I held reduction, as well as some instrumentation, as well as wound closure. We irrigated and closed the wound in standard layered  fashion using running 0 and figure-of-eight 0 Vicryl for the vastus lateralis, interrupted figure-of- eight #1 Vicryl for the tensor and then 2-0 Vicryl and 2-0 nylon for the skin.  Sterile gently compressive dressing was applied.  The patient was then rotated prone onto the spine bed, remaining in the room for Dr. Lus procedure.   PROGNOSIS:  Kimberly [name] should do well with high rate of healing, given the construct we were able to obtain. Immediate range of motion of the knee, hip and ankle are encouraged and patient may progress to touch down weightbearing. Formal pharmacologic DVT prophylaxis will be deferred to the Neurosurgery/ Trauma service given spine injury but have ordered mechanical prophylaxis. Office follow up in 2 weeks for removal of sutures and new x-rays.     Ozell DEL. Celena, M.D.

## 2024-06-08 NOTE — ED Triage Notes (Addendum)
 Pt to ED via [facility] from home. Pt got up to go to bathroom and backed up to toilet, but missed landing against wall and onto floor. Pt denies LOC. Pt c/o pain in head, neck, legs, and back.  Pt does not take blood thinner.  EMS noted deformities in cervical spine, L leg rotation and shortening. C-collar in place on arrival. Pt A&Ox4, moaning in pain on arrival. Pt has +PMS in bilateral feet. Pt received fentanyl  by EMS via 20g LAC.  EMS VSS.

## 2024-06-08 NOTE — Op Note (Signed)
 PREOP DIAGNOSIS: T11-12 Chance fracture  POSTOP DIAGNOSIS: T11-12 Chance fracture  PROCEDURE: 1.  Open reduction of T11-12 fracture 2.  Segmental instrumentation with pedicle screw and rod construct T11-T12-L1 3. Intraoperative neuronavigation with Stealth 4. Intraoperative CT scan  SURGEON: Dr. Dorn Ned, MD  ASSISTANT: Camie Pickle, PA.  Please note no qualified trainees were available to assist with the procedure.  Assistance was required through the entirety of the procedure.   ANESTHESIA: General Endotracheal  EBL: 25 ml  IMPLANTS:  Medtronic 5.5 x 40 mm screws x 2 at T11 4.5 x 35 mm screws x2 at T12 4.5 x 40 mm screws x 2 at L1  SPECIMENS: None  DRAINS: None  COMPLICATIONS: none  CONDITION: Stable to PACU  HISTORY: Kimberly [name] is a 68 y.o. female who initially presented several days after a fall.  She was found to have a left femur fracture and a T11-12 Chance fracture.  She complained of significant back pain, leg weakness.  MRI was performed and showed no spinal cord impingement.  She had diffuse ankylosis of the thoracic spine.  For purposes of stabilization, fracture reduction and percutaneous segmental instrumentation was recommended.  Risk, benefits, alternatives, and expected adolescence were discussed with her.Risks, benefits, alternatives, and expected convalescence were discussed with her.  Risks discussed included, but were not limited to bleeding, pain, infection, scar, spinal fluid leak, neurologic deficit, instability, pseudoarthrosis, damage to nearby organs, and death.  Informed consent was obtained.   PROCEDURE IN DETAIL: After informed consent was obtained and witnessed, the patient was brought to the operating room.  Dr. Celena performed his fixation of the left femur.  After this was completed,the patient was positioned on the operative table in the prone position on a Jackson table using logroll precautions with all pressure points  meticulously padded. The skin of the mid and low back was then prepped and draped in the usual sterile fashion.  A PSIS iliac spine was placed, and intraoperative CT obtained to allow for neuronavigation.  Using the Stealth navigation, paramedian incisions were planned for pedicle screw trajectories.  Incisions were made with a 10 blade.  Navigated high speed drill was used to cannulate the T11, T12, and L1 pedicles bilaterally, and pedicles were then tapped using navigation.  Ball ended feeler confirmed good cannulation with no breaches.  Appropriately sized pedicle screws were placed using navigation.  Adequate purchase was noted.   Rods were then passed through the screw towers bilaterally and secured with screw caps.  AP and lateral X-ray confirmed good placement of instrumentation and reduction of her mild kyphosis.  The screw caps were final tightened and the towers removed.  Wounds were irrigated thoroughly .   Marcaine  was injected into the paraspinous muscles and subcutaneous tissues bilaterally.  The fascia was closed with 0 Vicryl stitches.  The dermal layer was closed with 2-0 Vicryl stitches in buried interrupted fashion.  The skin incisions were closed with 4-0 Monocryl subcuticular manner followed by Dermabond.  The PSIS pin was removed and small incision closed with 3-0 vicryl in buried fashion and Dermabond.  Sterile dressings were placed.  Patient was then flipped supine and extubated by the anesthesia service following commands and all 4 extremities.  All counts were correct at the end of surgery.  No complications were noted.

## 2024-06-08 NOTE — ED Notes (Signed)
 Pt transported to MRI

## 2024-06-08 NOTE — Anesthesia Procedure Notes (Addendum)
 Procedure Name: Intubation Date/Time: 06/08/2024 3:59 PM  Performed by: Roslynn Waddell LABOR, CRNAPre-anesthesia Checklist: Patient identified, Emergency Drugs available, Suction available and Patient being monitored Patient Re-evaluated:Patient Re-evaluated prior to induction Oxygen  Delivery Method: Circle System Utilized Preoxygenation: Pre-oxygenation with 100% oxygen  Induction Type: IV induction Ventilation: Mask ventilation without difficulty Laryngoscope Size: Mac and 3 Grade View: Grade I Tube type: Oral Tube size: 7.0 mm Number of attempts: 1 Airway Equipment and Method: Stylet Placement Confirmation: ETT inserted through vocal cords under direct vision, positive ETCO2 and breath sounds checked- equal and bilateral Secured at: 21 cm Tube secured with: Tape Dental Injury: Teeth and Oropharynx as per pre-operative assessment  Comments: Atraumatic induction and intubation. No forced cervical extension for anesthesia team. Dentition and oral mucosa as per preop. Baseline poor dentition. Neurosurgeon had patient move head from side to side and up and down during preop eval. Patient noted no neck pain outside of what she normally has on a day to day level.

## 2024-06-08 NOTE — Progress Notes (Signed)
 Orthopedic Tech Progress Note Patient Details:  Kimberly [name] 09/18/1956 [id number]  Ortho Devices Type of Ortho Device: Thoracolumbar corset (TLSO) Ortho Device/Splint Location: Thoracic and Sternun Ortho Device/Splint Interventions: Ordered   Post Interventions Patient Tolerated: Well Instructions Provided: Care of device  [name] [name] 06/08/2024, 11:59 PM

## 2024-06-08 NOTE — ED Provider Notes (Addendum)
 [place] [facility] Provider Note   CSN: [id number] Arrival date & time: 06/08/24  9486     Patient presents with: Kimberly [name] is a 68 y.o. female.   The history is provided by the patient.  Fall  She has history of hypertension, diastolic heart failure, stroke, chronic back pain, COPD and comes in following a fall at home.  She hit her head, back and injured her left hip/thigh.  She does not think she had loss of consciousness.  She is not on any anticoagulants.  She is on diphosphonate therapy.   Prior to Admission medications   Medication Sig Start Date End Date Taking? Authorizing Provider  albuterol  (VENTOLIN  HFA) 108 (90 Base) MCG/ACT inhaler Inhale 2 puffs into the lungs 4 (four) times daily as needed for shortness of breath. 09/22/20   [provider]  Copper  Gluconate 2 MG TABS Take 1 tablet by mouth daily. 04/19/24   [provider]  copper  tablet Take 1 tablet (2 mg total) by mouth daily. 04/19/24   Kandala, Hyndavi, MD  cyclobenzaprine  (FLEXERIL ) 10 MG tablet Take 10 mg by mouth every 8 (eight) hours as needed. 04/03/24   [provider]  denosumab  (PROLIA ) 60 MG/ML SOSY injection Inject 60 mg into the skin every 6 (six) months.    [provider]  dicyclomine  (BENTYL ) 10 MG capsule TAKE ONE CAPSULE BY MOUTH THREE TIMES DAILY AS NEEDED FOR SPASMS 10/25/23   Carlan, Chelsea L, NP  famotidine  (PEPCID ) 40 MG tablet Take 40 mg by mouth daily.    [provider]  furosemide  (LASIX ) 20 MG tablet Take 20 mg by mouth 2 (two) times daily. 10/23/20   [provider]  levothyroxine  (SYNTHROID ) 125 MCG tablet Take 125 mcg by mouth daily. 03/02/24   [provider]  Multiple Vitamin (MULTIVITAMIN PO) Take 2 tablets by mouth daily. ADEK vitamin chewable    [provider]  omeprazole  (PRILOSEC) 40 MG capsule Take 1 capsule (40 mg total) by mouth daily. 07/18/23   Carlan,  Chelsea L, NP  ondansetron  (ZOFRAN ) 4 MG tablet TAKE 1 TABLET BY MOUTH EVERY 8 HOURS AS NEEDED FOR NAUSEA OR VOMITING 02/06/24   Carlan, Chelsea L, NP  potassium chloride  (MICRO-K ) 10 MEQ CR capsule Take 10 mEq by mouth 2 (two) times daily. 04/05/24   [provider]  PROCTO-MED HC  2.5 % rectal cream PLACE 1 APPLICATION RECTALLY TWICE DAILY 02/21/23   Carlan, Chelsea L, NP  rifaximin  (XIFAXAN ) 550 MG TABS tablet Take 1 tablet (550 mg total) by mouth 3 (three) times daily. 07/18/23   Carlan, Chelsea L, NP  spironolactone  (ALDACTONE ) 25 MG tablet Take 25 mg by mouth daily. 11/01/19   [provider]  TRELEGY ELLIPTA 100-62.5-25 MCG/ACT AEPB Inhale 1 puff into the lungs daily. 04/03/24   [provider]  triamcinolone  cream (KENALOG) 0.1 % Apply topically. 07/16/20   [provider]  Vitamin D, Ergocalciferol, (DRISDOL) 1.25 MG (50000 UNIT) CAPS capsule Take 50,000 Units by mouth once a week. 04/05/24   [provider]  zolpidem (AMBIEN) 5 MG tablet Take 5 mg by mouth at bedtime. 03/17/24   [provider]    Allergies: Cinnamon, Hydrocodone , Naproxen, Other, Oxycodone , Feldene [piroxicam], Lyrica [pregabalin], Nsaids, Phenergan [promethazine hcl], Sulfa antibiotics, Latex, and Tape    Review of Systems  All other systems reviewed and are negative.   Updated Vital Signs BP 125/86 (BP Location: Right Arm)  Pulse 60   Temp (!) 97.5 F (36.4 C) (Oral)   Resp (!) 22   Ht 5' (1.524 m)   Wt 59 kg   SpO2 100%   BMI 25.39 kg/m   Physical Exam Vitals and nursing note reviewed.   68 year old female, resting comfortably and in no acute distress. Vital signs are significant for slightly elevated respiratory rate. Oxygen  saturation is 100%, which is normal. Head is normocephalic and atraumatic. PERRLA, EOMI.  Neck is immobilized in a stiff cervical collar, she does have some mild tenderness along the cervical spine. Back is tender diffusely in the  thoracic and lumbar spine. Lungs are clear without rales, wheezes, or rhonchi. Chest is nontender. Heart has regular rate and rhythm without murmur. Abdomen is soft, flat, nontender. Extremities: Left leg is shortened and externally rotated with significant tenderness from the left hemipelvis to the left hip down to the mid femur.  Dorsalis pedis pulses intact and she has normal sensation.  No other extremity injury is seen. Skin is warm and dry without rash. Neurologic: Awake and alert.  (all labs ordered are listed, but only abnormal results are displayed) Labs Reviewed  BASIC METABOLIC PANEL WITH GFR - Abnormal; Notable for the following components:      Result Value   Glucose, Bld 108 (*)    Creatinine, Ser 1.07 (*)    Calcium  8.5 (*)    GFR, Estimated 57 (*)    All other components within normal limits  CBC WITH DIFFERENTIAL/PLATELET - Abnormal; Notable for the following components:   RBC 3.35 (*)    Hemoglobin 11.6 (*)    HCT 34.2 (*)    MCV 102.1 (*)    MCH 34.6 (*)    Platelets 136 (*)    All other components within normal limits  PROTIME-INR - Abnormal; Notable for the following components:   Prothrombin Time 15.6 (*)    All other components within normal limits  HIV ANTIBODY (ROUTINE TESTING W REFLEX)  TYPE AND SCREEN    EKG: EKG Interpretation Date/Time:  Friday June 08 2024 07:52:19 EDT Ventricular Rate:  77 PR Interval:  119 QRS Duration:  87 QT Interval:  430 QTC Calculation: 487 R Axis:   75  Text Interpretation: Sinus rhythm Borderline short PR interval Borderline prolonged QT interval When compared with ECG of 11/26/2023, No significant change was found Confirmed by Raford Lenis ([id number]) on 06/08/2024 8:00:27 AM  Radiology: CT T-SPINE NO CHARGE Addendum Date: 06/08/2024 ADDENDUM REPORT: 06/08/2024 07:54 ADDENDUM: Study discussed by telephone with Dr. LENIS RAFORD on 06/08/2024 at 0733 hours. Electronically Signed   By: VEAR Hurst M.D.   On: 06/08/2024 07:54    Result Date: 06/08/2024 CLINICAL DATA:  68 year old female status post fall at home. Left femur fracture. Pain. EXAM: CT THORACIC SPINE WITHOUT CONTRAST TECHNIQUE: Multiplanar CT images of the thoracic spine were reconstructed from contemporary CT of the Chest. RADIATION DOSE REDUCTION: This exam was performed according to the departmental dose-optimization program which includes automated exposure control, adjustment of the mA and/or kV according to patient size and/or use of iterative reconstruction technique. CT cervical spine, CONTRAST:  None COMPARISON:  CT Chest, Abdomen, and Pelvis today are reported separately. Chest CT 11/26/2023. FINDINGS: Limited cervical spine imaging: Cervical spine reported separately. Cervicothoracic junction alignment is within normal limits. Thoracic spine segmentation:  Normal, hypoplastic ribs at T12. Alignment: Progressed kyphosis at T12 secondary to acute fracture deformity there. Stable straightening of thoracic kyphosis elsewhere. Mild underlying chronic dextroconvex  thoracic scoliosis. Vertebrae: Hyperostosis related thoracic interbody ankylosis, from T3 through T4 via posterior element ankylosis, from T4 through T9 via anterior flowing endplate osteophytes, and from T11 through the thoracolumbar junction via posterior element ankylosis. Osteopenia. Superimposed acute Chance type fracture through the T11-T12 ankylosed level. Horizontal, mildly oblique fracture lucency traversing the chronically ankylosed posterior elements there including the T11 spinous process (series 1, image 33 and series 2, images 43 and 44). The T11 pedicles and body appear stable and intact. Acute on chronic T12 superior endplate compression fracture. Roughly 25% acute loss of T12 vertebral body height since January. Mild retropulsion of the posterosuperior endplate is new. The bilateral T12 posterior elements are fractured but nondisplaced. Other thoracic levels appears stable and intact. Lumbar  spine reported separately. Paraspinal and other soft tissues: Mild T12 paravertebral soft tissue contusion or hematoma. Chest and abdomen reported separately. Disc levels: T12 posterosuperior endplate retropulsion is superimposed on bulky chronic posterior element hypertrophy and ligament flavum calcification/ossification. Combined spinal stenosis is moderate, 40% (series 1, image 33). Thoracic spinal ankylosis above that level. IMPRESSION: 1. Acute on Chance type posttraumatic fracture of the ankylosed spine at T11-T12. Nondisplaced posterior element fractures. Associated acute on chronic T12 vertebral body compression (25%) with mild retropulsion of bone contributing to moderate multifactorial spinal stenosis there (in part degenerative). Recommend Spine Surgery consultation. 2. Underlying Thoracic hyperostosis related ankylosis from T3 through the thoracolumbar junction. 3. Lumbar spine, Chest, abdomen, and pelvis reported separately. Electronically Signed: By: VEAR Hurst M.D. On: 06/08/2024 07:29   CT L-SPINE NO CHARGE Result Date: 06/08/2024 CLINICAL DATA:  67 year old female status post fall at home. Left femur fracture. Pain. EXAM: CT LUMBAR SPINE WITHOUT CONTRAST TECHNIQUE: Technique: Multiplanar CT images of the lumbar spine were reconstructed from contemporary CT of the Abdomen and Pelvis. RADIATION DOSE REDUCTION: This exam was performed according to the departmental dose-optimization program which includes automated exposure control, adjustment of the mA and/or kV according to patient size and/or use of iterative reconstruction technique. CONTRAST:  None COMPARISON:  CT Chest, Abdomen, and Pelvis today reported separately. CT Abdomen and Pelvis 11/26/2023. FINDINGS: Segmentation: Transitional anatomy with sacralized L5 level. Correlation with radiographs is recommended prior to any operative intervention. This is concordant with the thoracic numbering today. Alignment: Stable since January lumbar  lordosis, mild dextroconvex lumbar scoliosis. Vertebrae: T11-T12 fracture through the ankylosed spine is reported separately today. Superimposed T12-L1 ankylosis via the posterior elements. No definite L1-L2 or L2-L3 ankylosis at this time. But L3 through sacral ankylosis via the posterior elements. Lumbar vertebrae appear stable. Mild chronic L4 superior endplate compression appears unchanged. Sacrum is detailed separately today. Paraspinal and other soft tissues: Abdomen and pelvis are reported separately. Lumbar paraspinal soft tissues remain normal. Disc levels: Stable lumbar spine degeneration from the January CT Abdomen and Pelvis. IMPRESSION: 1. Transitional anatomy with sacralized L5 level. T11-T12 fracture through the ankylosed spine is reported separately today. 2. No acute traumatic injury identified in the Lumbar Spine. Abnormal CT Abdomen and Pelvis reported separately. Electronically Signed   By: VEAR Hurst M.D.   On: 06/08/2024 07:37   CT CHEST ABDOMEN PELVIS WO CONTRAST Result Date: 06/08/2024 CLINICAL DATA:  68 year old female status post fall at home. Left femur fracture. Pain. EXAM: CT CHEST, ABDOMEN AND PELVIS WITHOUT CONTRAST TECHNIQUE: Multidetector CT imaging of the chest, abdomen and pelvis was performed following the standard protocol without IV contrast. RADIATION DOSE REDUCTION: This exam was performed according to the departmental dose-optimization program which includes automated exposure control,  adjustment of the mA and/or kV according to patient size and/or use of iterative reconstruction technique. COMPARISON:  CT thoracic and lumbar spine today reported separately. CT Chest, Abdomen, and Pelvis 11/26/2023. FINDINGS: CT CHEST FINDINGS Cardiovascular: Vascular patency is not evaluated in the absence of IV contrast. Stable cardiac size. No pericardial effusion. Mild for age Calcified aortic atherosclerosis. Mediastinum/Nodes: Small to moderate gastric hiatal hernia with superimposed  postoperative changes to the stomach. Stable since January. No evidence of mediastinal hematoma, mass, lymphadenopathy in the absence of IV contrast. Lungs/Pleura: Mildly lower lung volumes. Major airways remain patent. Symmetric bilateral lower lobe peribronchial and dependent opacity most resembles atelectasis. No pneumothorax. No pleural effusion. No consolidation. No convincing pulmonary contusion. Stable 3 4 mm right lower lobe lung nodule series 5, image 79 (no follow-up imaging recommended). Musculoskeletal: Abnormal thoracic spine is reported separately today. Osteopenia. Occasional chronic anterior rib fractures on the right. Visible shoulder osseous structures, sternum appear intact. No acute rib fracture identified. CT ABDOMEN PELVIS FINDINGS Hepatobiliary: Chronic cholecystectomy. Negative noncontrast liver. No perihepatic fluid is identified. Pancreas: Negative noncontrast pancreas. Spleen: Negative noncontrast spleen. No perisplenic fluid is identified. Adrenals/Urinary Tract: Noncontrast adrenal glands and kidneys appears stable and intact. No hydronephrosis or hydroureter. Mildly distended, unremarkable urinary bladder. Chronic pelvic phleboliths. Stomach/Bowel: Mild mass effect on the rectum from presacral pelvic hematoma, detailed below. Otherwise the rectum and distal large bowel appear negative, retained stool. Redundant transverse colon, splenic flexure. Mildly redundant right colon, cecum on a lax mesentery near the midline. Appendix not identified. Chronic anastomosis in the right abdomen appears to be small bowel related. Small volume of oral contrast in bowel loops in that area. No dilated small bowel. Chronic postoperative changes throughout the greater curve of the stomach. The stomach is decompressed. Duodenum is decompressed. No pneumoperitoneum or free fluid identified. Vascular/Lymphatic: Vascular patency is not evaluated in the absence of IV contrast. Aortoiliac calcified  atherosclerosis. Normal caliber abdominal aorta. No lymphadenopathy identified. Reproductive: Chronically diminutive or absent. Other: Deep pelvic, presacral hematoma which is described below. No superimposed pelvis free fluid. No acute superficial soft tissue injury identified, mild left lower quadrant abdominal wall contusion was present in January. Musculoskeletal: Lumbar spine detailed separately. Sacrum, SI joints, pelvis, and proximal right femur appears stable and intact. Highly comminuted proximal left femur with displaced and angulated comminution fragments beginning at the intertrochanteric segment again noted. Associated left thigh intramuscular hematoma (series 3, image 121). Additionally, there is a moderate volume of lobulated and heterogeneous presacral hematoma in the deep pelvis, which is asymmetric to the right (series 3, images 102 and 107 encompassing 30 x 64 x 55 mm (AP by transverse by CC), volume estimated at 50 mL. IMPRESSION: 1. Abnormal Thoracic spine CT, lumbar CT reported separately. 2. Presacral hematoma eccentric to the right in the deep pelvis, estimated 50 mL volume. Sacral osteopenia. No sacral or pelvic fracture is identified, but this finding is suspicious for occult sacral fracture. 3. Highly comminuted, displaced and angulated proximal Left femur fracture. Associated left thigh intramuscular hematoma. 4. No other acute traumatic injury identified in the noncontrast chest, abdomen, or pelvis. 5.  Aortic Atherosclerosis (ICD10-I70.0). Electronically Signed   By: VEAR Hurst M.D.   On: 06/08/2024 07:13   CT HEAD WO CONTRAST Result Date: 06/08/2024 CLINICAL DATA:  68 year old female status post fall at home. Left femur fracture. Pain. EXAM: CT HEAD WITHOUT CONTRAST TECHNIQUE: Contiguous axial images were obtained from the head CT 11/26/2023. Base of the skull through the vertex without  intravenous contrast. RADIATION DOSE REDUCTION: This exam was performed according to the  departmental dose-optimization program which includes automated exposure control, adjustment of the mA and/or kV according to patient size and/or use of iterative reconstruction technique. COMPARISON:  None Available. FINDINGS: Brain: Normal cerebral volume. No midline shift, ventriculomegaly, mass effect, evidence of mass lesion, intracranial hemorrhage or evidence of cortically based acute infarction. Patchy and confluent periatrial white matter hypodensity is asymmetric, stable. Gray-white differentiation stable and otherwise within normal limits. Vascular: No suspicious intracranial vascular hyperdensity. Calcified atherosclerosis at the skull base. Skull: Hyperostosis of the calvarium and osteopenia again noted. Chronic bilateral nasal bone fractures appear stable. No acute osseous abnormality identified. Sinuses/Orbits: Regressed left maxillary retention cysts since January. Other Visualized paranasal sinuses and mastoids are stable and well aerated. Other: No acute orbit or scalp soft tissue injury identified. IMPRESSION: 1. No acute traumatic injury identified. 2. Stable non contrast CT appearance of chronic cerebral white matter disease. 3. Chronic nasal bone fractures. Electronically Signed   By: VEAR Hurst M.D.   On: 06/08/2024 06:57   CT Cervical Spine Wo Contrast Result Date: 06/08/2024 CLINICAL DATA:  68 year old female status post fall at home. Left femur fracture. Pain. EXAM: CT CERVICAL SPINE WITHOUT CONTRAST TECHNIQUE: Multidetector CT imaging of the cervical spine was performed without intravenous contrast. Multiplanar CT image reconstructions were also generated. RADIATION DOSE REDUCTION: This exam was performed according to the departmental dose-optimization program which includes automated exposure control, adjustment of the mA and/or kV according to patient size and/or use of iterative reconstruction technique. COMPARISON:  Head CT today.  Cervical spine CT 11/26/2023. FINDINGS: Alignment:  Stable lordosis. Cervicothoracic junction alignment is within normal limits. Bilateral posterior element alignment is within normal limits. Skull base and vertebrae: Chronic osteopenia. Visualized skull base is intact. No atlanto-occipital dissociation. C1 and C2 appear intact and aligned. No acute osseous abnormality identified. Soft tissues and spinal canal: No prevertebral fluid or swelling. No visible canal hematoma. Negative for age visible noncontrast neck soft tissues. Disc levels: Chronic right side facet arthropathy in the mid cervical spine. Bulky chronic endplate spurring C5 through C7. Associated bulky chronic leftward C6-C7 disc and endplate degeneration. But otherwise capacious CT appearance of the spinal canal. Upper chest: Osteopenia. Visible upper thoracic levels appear intact. Negative lung apices. IMPRESSION: 1. No acute traumatic injury identified in the cervical spine. 2. Osteopenia.  Chronic cervical spine degeneration. Electronically Signed   By: VEAR Hurst M.D.   On: 06/08/2024 06:21   DG FEMUR MIN 2 VIEWS LEFT Result Date: 06/08/2024 CLINICAL DATA:  68 year old female status post fall at home.  Pain. EXAM: LEFT FEMUR 2 VIEWS COMPARISON:  None Available. FINDINGS: Four views of the left femur 0542 hours. Long segment spiral and comminuted left femur fracture beginning at the intertrochanteric segment and continuing distally a distance of about 15 cm. Large medial butterfly fragment displaced medially more than 1 full shaft width. Left femoral head appears to remain normally located. Grossly intact visible left hemipelvis. Distal left femur, alignment at the left knee appear intact. Tricompartmental left knee joint degeneration. Grossly intact visible proximal tibia and fibula. IMPRESSION: Long segment spiral and comminuted left femur fracture from the intertrochanteric segment continuing 15 cm distally into the midshaft. Large and medially displaced comminution fragment. Electronically Signed    By: VEAR Hurst M.D.   On: 06/08/2024 05:53     Procedures   Medications Ordered in the ED  morphine  (PF) 4 MG/ML injection 4 mg (4 mg Intravenous  Given 06/08/24 0559)  morphine  (PF) 4 MG/ML injection 4 mg (has no administration in time range)  acetaminophen  (TYLENOL ) tablet 1,000 mg (has no administration in time range)  oxyCODONE  (Oxy IR/ROXICODONE ) immediate release tablet 2.5-5 mg (has no administration in time range)  morphine  (PF) 2 MG/ML injection 1-2 mg (has no administration in time range)  methocarbamol  (ROBAXIN ) tablet 500 mg (has no administration in time range)    Or  methocarbamol  (ROBAXIN ) injection 500 mg (has no administration in time range)  docusate sodium  (COLACE) capsule 100 mg (has no administration in time range)  polyethylene glycol (MIRALAX  / GLYCOLAX ) packet 17 g (has no administration in time range)  ondansetron  (ZOFRAN -ODT) disintegrating tablet 4 mg (has no administration in time range)    Or  ondansetron  (ZOFRAN ) injection 4 mg (has no administration in time range)  metoprolol tartrate (LOPRESSOR) injection 5 mg (has no administration in time range)  hydrALAZINE  (APRESOLINE ) injection 10 mg (has no administration in time range)  fentaNYL  (SUBLIMAZE ) injection 50 mcg (50 mcg Intravenous Given 06/08/24 0647)  ondansetron  (ZOFRAN ) injection 4 mg (4 mg Intravenous Given 06/08/24 0646)                                    Medical Decision Making Amount and/or Complexity of Data Reviewed Labs: ordered. Radiology: ordered.  Risk Prescription drug management. Decision regarding hospitalization.   Fall with injury to left hip/femur.  Also question of head injury and back injury.  I have ordered morphine  for pain.  In order to minimize movement, I have ordered CT scans of head, cervical spine, thoracic and lumbar spine as well as plain x-rays of her left femur.  Echocardiogram on 05/15/2024 showed normal ejection fraction and grade 1 diastolic dysfunction.  Femur  x-ray showed spiral and comminuted fracture of the proximal femur and starting in the intertrochanteric area.  CT of the head and cervical spine showed no acute injury.  CT of chest/abdomen/pelvis showed a presacral hematoma extending into the right side of the pelvis, above-noted femur fracture.  Reformatted CT of the thoracic and lumbar spine showed fracture of T11/T12 which appears to be a Chance fracture superimposed on compression fracture and is unstable.  I have independently viewed all of these images, and discussed the findings directly with radiologist.  I have discussed the femur fracture with Dr. Celena of orthopedic service who states that he will see the patient in consultation and plan on surgery later today.  I have discussed the case with [name] of trauma surgery service who agrees to accept the patient on the trauma service.  I have put out a page to neurosurgery and I am waiting to hear back from them.  In the meantime, I have ordered Buck's traction, morphine  for pain.  I reviewed her electrocardiogram, and my interpretation is sinus rhythm without ectopy and no acute ST or T changes.  I have reviewed her laboratory tests, and my interpretation is stable macrocytic anemia, borderline thrombocytopenia which is not significantly changed from prior, elevated random glucose level, stable borderline renal insufficiency.  I have discussed the case with Camie Pickle of neurosurgery service who states she is coming to evaluate the patient.  They will coordinate with Dr. Celena regarding surgical management of the separate fractures.SABRA   CRITICAL CARE Performed by: Alm Lias Total critical care time: 65 minutes Critical care time was exclusive of separately billable procedures and treating other  patients. Critical care was necessary to treat or prevent imminent or life-threatening deterioration. Critical care was time spent personally by me on the following activities: development of  treatment plan with patient and/or surrogate as well as nursing, discussions with consultants, evaluation of patient's response to treatment, examination of patient, obtaining history from patient or surrogate, ordering and performing treatments and interventions, ordering and review of laboratory studies, ordering and review of radiographic studies, pulse oximetry and re-evaluation of patient's condition.  Final diagnoses:  Fall at home, initial encounter  Fracture of proximal end of left femur, closed, initial encounter [facility])  Closed fracture of twelfth thoracic vertebra, unspecified fracture morphology, initial encounter (HCC)  Macrocytic anemia  Elevated random blood glucose level  Thrombocytopenia [facility])    ED Discharge Orders     None          Raford Lenis, MD 06/08/24 9187    Raford Lenis, MD 06/08/24 [id number]    Raford Lenis, MD 06/08/24 [phone]

## 2024-06-08 NOTE — Transfer of Care (Signed)
 Immediate Anesthesia Transfer of Care Note  Patient: Kimberly [name]  Procedure(s) Performed: INSERTION, INTRAMEDULLARY ROD, FEMUR (Left: Leg Upper) OPEN REDUCTION INTERNAL FIXATION FEMORAL SHAFT FRACTURE (Left: Leg Upper) THORACIC ELEVEN-LUMBAR ONE PERCUTANEOUS PEDICLE SCREW (Spine Thoracic) APPLICATION OF O-ARM  Patient Location: PACU  Anesthesia Type:General  Level of Consciousness: awake, alert , and oriented  Airway & Oxygen  Therapy: Patient Spontanous Breathing and Patient connected to nasal cannula oxygen   Post-op Assessment: Report given to RN and Post -op Vital signs reviewed and stable  Post vital signs: Reviewed and stable  Last Vitals:  Vitals Value Taken Time  BP 130/77 06/08/24 20:30  Temp    Pulse 90 06/08/24 20:40  Resp 10 06/08/24 20:39  SpO2 97 % 06/08/24 20:40  Vitals shown include unfiled device data.  Last Pain:  Vitals:   06/08/24 1530  TempSrc: (P) Axillary  PainSc:          Complications: No notable events documented.

## 2024-06-08 NOTE — Progress Notes (Addendum)
 I reviewed patient's imaging including recently completed MRI.  She has thoracic spine DISH with T11-12 bony chance fracture, minimally displaced.  There is acute on chronic T12 body compression fracture.  No ongoing spinal cord compression in cervical, thoracic spine, and no severe stenosis in lumbar spine.  Will plan on T11-L1 perc instrumentation, reduction of fracture. Risks, benefits, alternatives, and expected convalescence were discussed with her.  Risks discussed included, but were not limited to bleeding, pain, infection, scar, spinal fluid leak, neurologic deficit, instability, pseudoarthrosis, damage to nearby organs, and death.  Informed consent was obtained.   On questioning patient does not have any new neck pain and her MRI C-spine showed no acute injury.  Additionally, exam testing by nexus criteria revealed no pain.  C-collar was cleared.

## 2024-06-08 NOTE — Anesthesia Preprocedure Evaluation (Addendum)
 Anesthesia Evaluation  Patient identified by MRN, date of birth, ID band Patient awake    Reviewed: Allergy & Precautions, NPO status , Patient's Chart, lab work & pertinent test results  History of Anesthesia Complications (+) PONV  Airway Mallampati: I  TM Distance: >3 FB Neck ROM: Full    Dental  (+) Dental Advisory Given   Pulmonary asthma , sleep apnea (no longer requires CPAP) , COPD,  COPD inhaler   breath sounds clear to auscultation       Cardiovascular hypertension, Pt. on medications + CAD (non-obstructive by cath)   Rhythm:Regular Rate:Normal  04/2024 ECHO: EF 60 to 65%. 1. The LV has normal function, no regional wall motion abnormalities.  Grade I diastolic  dysfunction (impaired relaxation).   2. RVF is normal. The right ventricular size is mildly enlarged.  3. The mitral valve is normal in structure. No evidence of mitral valve regurgitation. No evidence of mitral stenosis.   4. The aortic valve is tricuspid. Aortic valve regurgitation is not visualized. No aortic stenosis is present.     Neuro/Psych  Headaches   Depression    glaucoma CVA (L weakness), Residual Symptoms  C-spine cleared    GI/Hepatic hiatal hernia,GERD  Medicated,,(+) Cirrhosis       S/p Bariatric surgery   Endo/Other  Hypothyroidism    Renal/GU      Musculoskeletal  (+) Arthritis ,  Fibromyalgia -  Abdominal   Peds  Hematology Hb 11.6, plt 136k   Anesthesia Other Findings Kimberly [name] was trying to go to the bathroom when she got confused and fell. She had immediate hip and back pain and could not get up. -acute chance type T12 fracture with neurologic symptoms,  -severely displaced and unstable left proximal femur fracture (shaft + hip). Will require hip stabilization.  Reproductive/Obstetrics                              Anesthesia Physical Anesthesia Plan  ASA: 3  Anesthesia Plan: General    Post-op Pain Management: Tylenol  PO (pre-op)*   Induction: Intravenous  PONV Risk Score and Plan: 4 or greater and Ondansetron , Dexamethasone  and Scopolamine patch - Pre-op  Airway Management Planned: Oral ETT  Additional Equipment: None  Intra-op Plan:   Post-operative Plan: Extubation in OR  Informed Consent: I have reviewed the patients History and Physical, chart, labs and discussed the procedure including the risks, benefits and alternatives for the proposed anesthesia with the patient or authorized representative who has indicated his/her understanding and acceptance.     Dental advisory given  Plan Discussed with: CRNA and Surgeon  Anesthesia Plan Comments:          Anesthesia Quick Evaluation

## 2024-06-08 NOTE — H&P (Signed)
 Kimberly [name] 10/30/1956  [id number].    Requesting MD: Dr. Paola Chief Complaint/Reason for Consult: Fall  HPI: Kimberly [name] is a 68 y.o. female who presented via EMS after a fall.  Patient reports that she was recently started on gabapentin  and Ambien and took this last night.  She was try to mobilize to the bathroom and she became lightheaded and when she thought she was sitting on the commode she missed, falling and leaning against the wall and onto the floor.  She did hit her head.  No loss of consciousness.  She complains of pain in her back, left hip and left leg.  She reports she does have lower extremity numbness/tingling at baseline but this seems to be worse on the left lower extremity now.  She also reports that she is having some neck pain as well as numbness and tingling of the upper extremities, worse on the left that is different from her baseline carpal tunnel.  She denies other complaints.  She has been able to void since arrival without gross hematuria. She is not on blood thinners.   Past Medical History: CAD, CHF, COPD, Cirrhosis, Fibromyalgia, HTN, HLD, Hypothyroidism, OSA, Pulm HTN, Seizures, CVA.  She reports since having her duodenal switch she has been able to come off medications for HTN, HLD, DM2 and no longer uses a CPAP. She does report episodes of hypoglycemia since her duodenal switch Allergies: Hydrocodone , oxycodone , nsaids, lyrica, feldene, phenergan, sulfa abx, latex Tobacco Use: None Alcohol Use: None Substance use: None  Lives at home with her husband in a 1 story house with 1 STE.   ROS: ROS As above, see hpi  Family History  Problem Relation Age of Onset   Colon cancer Brother    Cancer Other    Heart failure Other    Breast cancer Neg Hx     Past Medical History:  Diagnosis Date   Anemia    PMH: as a child   Arthritis    Asthma    CAD (coronary artery disease)    nonobstructive by cath, 6/08 (false positive Cardiolite ) normal  stress echo, 7/11   CHF (congestive heart failure) (HCC)    Chronic back pain    Chronic bronchitis (HCC)    Cirrhosis (HCC)    Complication of anesthesia    had an asthma attack when woke up from procedure   Complication of anesthesia    asthma attack with one surgery   COPD (chronic obstructive pulmonary disease) (HCC)    Degenerative joint disease    Depression    DJD (degenerative joint disease)    Fatty liver    Fibromyalgia    GERD (gastroesophageal reflux disease)    Glaucoma    Headache(784.0)    Heart failure, diastolic, chronic (HCC)    Heart murmur    PMH:As a child only   Hernia of abdominal cavity    upper and lower hernia   History of hiatal hernia    HTN (hypertension)    Hypertension    Hypothyroidism    IBS (irritable bowel syndrome)    Morbid obesity (HCC)    Neuropathy    associated with diabetes   Obstructive sleep apnea    Pancreatitis    Pinched nerve in neck    Pneumonia    as a child   PONV (postoperative nausea and vomiting)    Pulmonary hypertension (HCC)    Rheumatic fever    PMH: as a child  Seizures (HCC)    PMH: only as a child   Seizures (HCC)    in childhood   Shortness of breath    Sleep apnea    Spinal stenosis    Stroke (HCC)    12/09/2013    Past Surgical History:  Procedure Laterality Date   ABDOMINAL HYSTERECTOMY     ABDOMINAL HYSTERECTOMY     partial hysterectomy   APPENDECTOMY     BACK SURGERY     BACK SURGERY     disc surgery with complications and damage to right side   BARIATRIC SURGERY     DS in June 2020   BILATERAL KNEE ARTHROSCOPY  2000, 2009   BIOPSY  06/16/2021   Procedure: BIOPSY;  Surgeon: Eartha Angelia Sieving, MD;  Location: [facility];  Service: Gastroenterology;;   BREAST CYST EXCISION Left    CARDIAC CATHETERIZATION     2009   CATARACT EXTRACTION W/ INTRAOCULAR LENS  IMPLANT, BILATERAL     CHOLECYSTECTOMY  11/22/1992   CHOLECYSTECTOMY     COLONOSCOPY N/A 08/01/2013   Procedure:  COLONOSCOPY;  Surgeon: Claudis RAYMOND Rivet, MD;  Location: [facility];  Service: Endoscopy;  Laterality: N/A;  930   COLONOSCOPY N/A 08/03/2017   Procedure: COLONOSCOPY;  Surgeon: Rivet Claudis RAYMOND, MD;  Location: [facility];  Service: Endoscopy;  Laterality: N/A;  12:00   COLONOSCOPY WITH PROPOFOL  N/A 06/28/2023   Procedure: COLONOSCOPY WITH PROPOFOL ;  Surgeon: Eartha Angelia Sieving, MD;  Location: [facility];  Service: Gastroenterology;  Laterality: N/A;  12:30pm;asa 3   CYST EXCISION     Left breast   ESOPHAGOGASTRODUODENOSCOPY (EGD) WITH PROPOFOL  N/A 06/16/2021   Procedure: ESOPHAGOGASTRODUODENOSCOPY (EGD) WITH PROPOFOL ;  Surgeon: Eartha Angelia Sieving, MD;  [facility]: [facility];  Service: Gastroenterology;  Laterality: N/A;  12:50   ESOPHAGOGASTRODUODENOSCOPY (EGD) WITH PROPOFOL  N/A 06/28/2023   Procedure: ESOPHAGOGASTRODUODENOSCOPY (EGD) WITH PROPOFOL ;  Surgeon: Eartha Angelia Sieving, MD;  Location: [facility];  Service: Gastroenterology;  Laterality: N/A;  12:30pm;asa 3   EXCISIONAL HEMORRHOIDECTOMY     EYE SURGERY  11/22/1965   EYE SURGERY     bilateral cataract removal with lens implants   EYE SURGERY     growth removed from right eye lid   EYE SURGERY     bilateral eye surgery age 19 tto straighten eyes'   heel (other)  11/23/2003   HEEL SPUR SURGERY     Right heel - growth removal and rebuilding of heel   HEMORRHOID SURGERY     HERNIA REPAIR     KNEE ARTHROSCOPY Bilateral    LUMBAR LAMINECTOMY/DECOMPRESSION MICRODISCECTOMY Right 08/23/2014   Procedure: LUMBAR LAMINECTOMY/DECOMPRESSION MICRODISCECTOMY 1 LEVEL;  Surgeon: Victory DELENA Gunnels, MD;  Location: [facility];  Service: Neurosurgery;  Laterality: Right;  LUMBAR LAMINECTOMY/DECOMPRESSION MICRODISCECTOMY 1 LEVEL LUMBAR 2-3   ovaries removed  11/22/2001   PARTIAL HYSTERECTOMY  11/23/1979   SHOULDER ARTHROSCOPY WITH DISTAL CLAVICLE RESECTION Left 08/06/2022   Procedure: SHOULDER ARTHROSCOPY WITH DISTAL CLAVICLE  RESECTION;  Surgeon: Sharl Selinda Dover, MD;  Location: [facility];  Service: Orthopedics;  Laterality: Left;   SHOULDER ARTHROSCOPY WITH ROTATOR CUFF REPAIR Left 08/06/2022   Procedure: SHOULDER ARTHROSCOPY WITH ROTATOR CUFF REPAIR;  Surgeon: Sharl Selinda Dover, MD;  Location: [facility] OR;  Service: Orthopedics;  Laterality: Left;   SHOULDER ARTHROSCOPY WITH SUBACROMIAL DECOMPRESSION Left 08/06/2022   Procedure: SHOULDER ARTHROSCOPY WITH SUBACROMIAL DECOMPRESSIONWITH EXTENSIVE DEBRIDEMENT;  Surgeon: Sharl Selinda Dover, MD;  Location: [facility];  Service: Orthopedics;  Laterality: Left;   SPHINCTEROTOMY     spinahatomy  11/22/1990   TOTAL KNEE ARTHROPLASTY Right 11/05/2020   Procedure: RIGHT TOTAL KNEE ARTHROPLASTY;  Surgeon: Barbarann Oneil BROCKS, MD;  Location: [facility];  Service: Orthopedics;  Laterality: Right;   TUBAL LIGATION  11/22/1973   TUBAL LIGATION      Social History:  reports that she has never smoked. She has never been exposed to tobacco smoke. She has never used smokeless tobacco. She reports that she does not drink alcohol and does not use drugs.  Allergies:  Allergies  Allergen Reactions   Cinnamon Anaphylaxis    Denies allergy on 08/06/22, states It was an interaction when mixed w/another med I no longer take   Hydrocodone  Other (See Comments)    Over sedation   Naproxen Anaphylaxis and Other (See Comments)    Throat swelling   Other Shortness Of Breath and Other (See Comments)    Local anesthesia Had asthma attack  08/06/22 tolerated amide local anesthetic in peripheral nerve block.   Oxycodone  Other (See Comments)    Over sedation   Feldene [Piroxicam] Nausea Only   Lyrica [Pregabalin] Other (See Comments)    Altered mental status for prolonged period   Nsaids     Avoid due to history of gastic bypass   Phenergan [Promethazine Hcl] Other (See Comments)    triggers asthma attacks   Sulfa Antibiotics Other (See Comments)    (Yeast infection, per patient.)   Latex Rash and  Other (See Comments)    Blistery rash.    Tape Rash and Other (See Comments)    Do not use adhesive tape; okay to use paper tape. 08/06/22 tolerated adhesive tape for peripheral IV    (Not in a hospital admission)    Physical Exam: Blood pressure 125/86, pulse 60, temperature (!) 97.5 F (36.4 C), temperature source Oral, resp. rate (!) 22, height 5' (1.524 m), weight 59 kg, SpO2 100%. General: pleasant, WD/WN female who is laying in bed in NAD HEENT: No racoon eyes or battle sign. No CSF otorrhea. Sclera are non-icteric. Ears and nose without any obvious masses or lesions.  Mouth is pink and moist. Dentition fair Heart: regular, rate, and rhythm. Radial and DP 2+ b/l.  Lungs: CTAB, no wheezes, rhonchi, or rales noted.  Respiratory effort nonlabored Abd:  Soft, ND, NT, +BS.  MS: R shoulder ttp without obvious deformity. Pain with ROM. Left elbow ttp. R knee ttp and pain with ROM. Left upper thigh deformity with L leg is shortened and externally rotated. Compartments soft. Otherwise no obvious deformity or ttp of BUE or BLE Skin: warm and dry  Psych: A&Ox4 with an appropriate affect Neuro: CN 3-12 grossly intact, normal speech, thought process intact, moves all extremities with expected limitations from known fx's, decreased sensation of the R > L for UE testing, decreased sensation of LLE compared to RLE, no foot drop, gait not assessed  Results for orders placed or performed during the hospital encounter of 06/08/24 (from the past 48 hours)  Basic metabolic panel     Status: Abnormal   Collection Time: 06/08/24  5:29 AM  Result Value Ref Range   Sodium 138 135 - 145 mmol/L   Potassium 3.6 3.5 - 5.1 mmol/L   Chloride 104 98 - 111 mmol/L   CO2 22 22 - 32 mmol/L   Glucose, Bld 108 (H) 70 - 99 mg/dL    Comment: Glucose reference range applies only to samples taken after fasting for  at least 8 hours.   BUN 14 8 - 23 mg/dL   Creatinine, Ser 8.92 (H) 0.44 - 1.00 mg/dL   Calcium  8.5 (L)  8.9 - 10.3 mg/dL   GFR, Estimated 57 (L) >60 mL/min    Comment: (NOTE) Calculated using the CKD-EPI Creatinine Equation (2021)    Anion gap 12 5 - 15    Comment: Performed at [facility], [address] [place], [place] [address]  CBC with Differential     Status: Abnormal   Collection Time: 06/08/24  5:29 AM  Result Value Ref Range   WBC 7.0 4.0 - 10.5 K/uL   RBC 3.35 (L) 3.87 - 5.11 MIL/uL   Hemoglobin 11.6 (L) 12.0 - 15.0 g/dL   HCT 65.7 (L) 63.9 - 53.9 %   MCV 102.1 (H) 80.0 - 100.0 fL   MCH 34.6 (H) 26.0 - 34.0 pg   MCHC 33.9 30.0 - 36.0 g/dL   RDW 86.2 88.4 - 84.4 %   Platelets 136 (L) 150 - 400 K/uL   nRBC 0.0 0.0 - 0.2 %   Neutrophils Relative % 77 %   Neutro Abs 5.3 1.7 - 7.7 K/uL   Lymphocytes Relative 17 %   Lymphs Abs 1.2 0.7 - 4.0 K/uL   Monocytes Relative 6 %   Monocytes Absolute 0.4 0.1 - 1.0 K/uL   Eosinophils Relative 0 %   Eosinophils Absolute 0.0 0.0 - 0.5 K/uL   Basophils Relative 0 %   Basophils Absolute 0.0 0.0 - 0.1 K/uL   Immature Granulocytes 0 %   Abs Immature Granulocytes 0.02 0.00 - 0.07 K/uL    Comment: Performed at [facility], [address] [place], [place] [address]  Protime-INR     Status: Abnormal   Collection Time: 06/08/24  5:29 AM  Result Value Ref Range   Prothrombin Time 15.6 (H) 11.4 - 15.2 seconds   INR 1.2 0.8 - 1.2    Comment: (NOTE) INR goal varies based on device and disease states. Performed at [facility], [address] [place], [place] [address]   Type and screen [facility]     Status: None (Preliminary result)   Collection Time: 06/08/24  6:00 AM  Result Value Ref Range   ABO/RH(D) PENDING    Antibody Screen PENDING    Sample Expiration      06/11/2024,2359 Performed at [facility], [address], [place], [place] [address]    CT T-SPINE NO CHARGE Addendum Date: 06/08/2024 ADDENDUM REPORT: 06/08/2024 07:54 ADDENDUM: Study discussed by telephone with Dr. ALM LIAS on 06/08/2024 at 0733 hours. Electronically Signed   By: VEAR Hurst M.D.   On: 06/08/2024 07:54   Result Date: 06/08/2024 CLINICAL DATA:  68 year old female status post fall at home. Left femur fracture. Pain. EXAM: CT THORACIC SPINE WITHOUT CONTRAST TECHNIQUE: Multiplanar CT images of the thoracic spine were reconstructed from contemporary CT of the Chest. RADIATION DOSE REDUCTION: This exam was performed according to the departmental dose-optimization program which includes automated exposure control, adjustment of the mA and/or kV according to patient size and/or use of iterative reconstruction technique. CT cervical spine, CONTRAST:  None COMPARISON:  CT Chest, Abdomen, and Pelvis today are reported separately. Chest CT 11/26/2023. FINDINGS: Limited cervical spine imaging: Cervical spine reported separately. Cervicothoracic junction alignment is within normal limits. Thoracic spine segmentation:  Normal, hypoplastic ribs at T12. Alignment: Progressed kyphosis at T12 secondary to acute fracture deformity there. Stable  straightening of thoracic kyphosis elsewhere. Mild underlying chronic dextroconvex thoracic scoliosis. Vertebrae: Hyperostosis related thoracic interbody ankylosis, from T3 through T4 via posterior element ankylosis, from T4 through T9 via anterior flowing endplate osteophytes, and from T11 through the thoracolumbar junction via posterior element ankylosis. Osteopenia. Superimposed acute Chance type fracture through the T11-T12 ankylosed level. Horizontal, mildly oblique fracture lucency traversing the chronically ankylosed posterior elements there including the T11 spinous process (series 1, image 33 and series 2, images 43 and 44). The T11 pedicles and body appear stable and intact. Acute on chronic T12 superior endplate compression fracture. Roughly 25% acute loss of T12 vertebral body height since January. Mild retropulsion of the posterosuperior endplate is new. The bilateral T12 posterior  elements are fractured but nondisplaced. Other thoracic levels appears stable and intact. Lumbar spine reported separately. Paraspinal and other soft tissues: Mild T12 paravertebral soft tissue contusion or hematoma. Chest and abdomen reported separately. Disc levels: T12 posterosuperior endplate retropulsion is superimposed on bulky chronic posterior element hypertrophy and ligament flavum calcification/ossification. Combined spinal stenosis is moderate, 40% (series 1, image 33). Thoracic spinal ankylosis above that level. IMPRESSION: 1. Acute on Chance type posttraumatic fracture of the ankylosed spine at T11-T12. Nondisplaced posterior element fractures. Associated acute on chronic T12 vertebral body compression (25%) with mild retropulsion of bone contributing to moderate multifactorial spinal stenosis there (in part degenerative). Recommend Spine Surgery consultation. 2. Underlying Thoracic hyperostosis related ankylosis from T3 through the thoracolumbar junction. 3. Lumbar spine, Chest, abdomen, and pelvis reported separately. Electronically Signed: By: VEAR Hurst M.D. On: 06/08/2024 07:29   CT L-SPINE NO CHARGE Result Date: 06/08/2024 CLINICAL DATA:  68 year old female status post fall at home. Left femur fracture. Pain. EXAM: CT LUMBAR SPINE WITHOUT CONTRAST TECHNIQUE: Technique: Multiplanar CT images of the lumbar spine were reconstructed from contemporary CT of the Abdomen and Pelvis. RADIATION DOSE REDUCTION: This exam was performed according to the departmental dose-optimization program which includes automated exposure control, adjustment of the mA and/or kV according to patient size and/or use of iterative reconstruction technique. CONTRAST:  None COMPARISON:  CT Chest, Abdomen, and Pelvis today reported separately. CT Abdomen and Pelvis 11/26/2023. FINDINGS: Segmentation: Transitional anatomy with sacralized L5 level. Correlation with radiographs is recommended prior to any operative intervention.  This is concordant with the thoracic numbering today. Alignment: Stable since January lumbar lordosis, mild dextroconvex lumbar scoliosis. Vertebrae: T11-T12 fracture through the ankylosed spine is reported separately today. Superimposed T12-L1 ankylosis via the posterior elements. No definite L1-L2 or L2-L3 ankylosis at this time. But L3 through sacral ankylosis via the posterior elements. Lumbar vertebrae appear stable. Mild chronic L4 superior endplate compression appears unchanged. Sacrum is detailed separately today. Paraspinal and other soft tissues: Abdomen and pelvis are reported separately. Lumbar paraspinal soft tissues remain normal. Disc levels: Stable lumbar spine degeneration from the January CT Abdomen and Pelvis. IMPRESSION: 1. Transitional anatomy with sacralized L5 level. T11-T12 fracture through the ankylosed spine is reported separately today. 2. No acute traumatic injury identified in the Lumbar Spine. Abnormal CT Abdomen and Pelvis reported separately. Electronically Signed   By: VEAR Hurst M.D.   On: 06/08/2024 07:37   CT CHEST ABDOMEN PELVIS WO CONTRAST Result Date: 06/08/2024 CLINICAL DATA:  68 year old female status post fall at home. Left femur fracture. Pain. EXAM: CT CHEST, ABDOMEN AND PELVIS WITHOUT CONTRAST TECHNIQUE: Multidetector CT imaging of the chest, abdomen and pelvis was performed following the standard protocol without IV contrast. RADIATION DOSE REDUCTION: This exam was performed according to  the departmental dose-optimization program which includes automated exposure control, adjustment of the mA and/or kV according to patient size and/or use of iterative reconstruction technique. COMPARISON:  CT thoracic and lumbar spine today reported separately. CT Chest, Abdomen, and Pelvis 11/26/2023. FINDINGS: CT CHEST FINDINGS Cardiovascular: Vascular patency is not evaluated in the absence of IV contrast. Stable cardiac size. No pericardial effusion. Mild for age Calcified aortic  atherosclerosis. Mediastinum/Nodes: Small to moderate gastric hiatal hernia with superimposed postoperative changes to the stomach. Stable since January. No evidence of mediastinal hematoma, mass, lymphadenopathy in the absence of IV contrast. Lungs/Pleura: Mildly lower lung volumes. Major airways remain patent. Symmetric bilateral lower lobe peribronchial and dependent opacity most resembles atelectasis. No pneumothorax. No pleural effusion. No consolidation. No convincing pulmonary contusion. Stable 3 4 mm right lower lobe lung nodule series 5, image 79 (no follow-up imaging recommended). Musculoskeletal: Abnormal thoracic spine is reported separately today. Osteopenia. Occasional chronic anterior rib fractures on the right. Visible shoulder osseous structures, sternum appear intact. No acute rib fracture identified. CT ABDOMEN PELVIS FINDINGS Hepatobiliary: Chronic cholecystectomy. Negative noncontrast liver. No perihepatic fluid is identified. Pancreas: Negative noncontrast pancreas. Spleen: Negative noncontrast spleen. No perisplenic fluid is identified. Adrenals/Urinary Tract: Noncontrast adrenal glands and kidneys appears stable and intact. No hydronephrosis or hydroureter. Mildly distended, unremarkable urinary bladder. Chronic pelvic phleboliths. Stomach/Bowel: Mild mass effect on the rectum from presacral pelvic hematoma, detailed below. Otherwise the rectum and distal large bowel appear negative, retained stool. Redundant transverse colon, splenic flexure. Mildly redundant right colon, cecum on a lax mesentery near the midline. Appendix not identified. Chronic anastomosis in the right abdomen appears to be small bowel related. Small volume of oral contrast in bowel loops in that area. No dilated small bowel. Chronic postoperative changes throughout the greater curve of the stomach. The stomach is decompressed. Duodenum is decompressed. No pneumoperitoneum or free fluid identified. Vascular/Lymphatic:  Vascular patency is not evaluated in the absence of IV contrast. Aortoiliac calcified atherosclerosis. Normal caliber abdominal aorta. No lymphadenopathy identified. Reproductive: Chronically diminutive or absent. Other: Deep pelvic, presacral hematoma which is described below. No superimposed pelvis free fluid. No acute superficial soft tissue injury identified, mild left lower quadrant abdominal wall contusion was present in January. Musculoskeletal: Lumbar spine detailed separately. Sacrum, SI joints, pelvis, and proximal right femur appears stable and intact. Highly comminuted proximal left femur with displaced and angulated comminution fragments beginning at the intertrochanteric segment again noted. Associated left thigh intramuscular hematoma (series 3, image 121). Additionally, there is a moderate volume of lobulated and heterogeneous presacral hematoma in the deep pelvis, which is asymmetric to the right (series 3, images 102 and 107 encompassing 30 x 64 x 55 mm (AP by transverse by CC), volume estimated at 50 mL. IMPRESSION: 1. Abnormal Thoracic spine CT, lumbar CT reported separately. 2. Presacral hematoma eccentric to the right in the deep pelvis, estimated 50 mL volume. Sacral osteopenia. No sacral or pelvic fracture is identified, but this finding is suspicious for occult sacral fracture. 3. Highly comminuted, displaced and angulated proximal Left femur fracture. Associated left thigh intramuscular hematoma. 4. No other acute traumatic injury identified in the noncontrast chest, abdomen, or pelvis. 5.  Aortic Atherosclerosis (ICD10-I70.0). Electronically Signed   By: VEAR Hurst M.D.   On: 06/08/2024 07:13   CT HEAD WO CONTRAST Result Date: 06/08/2024 CLINICAL DATA:  68 year old female status post fall at home. Left femur fracture. Pain. EXAM: CT HEAD WITHOUT CONTRAST TECHNIQUE: Contiguous axial images were obtained from the head CT  11/26/2023. Base of the skull through the vertex without intravenous  contrast. RADIATION DOSE REDUCTION: This exam was performed according to the departmental dose-optimization program which includes automated exposure control, adjustment of the mA and/or kV according to patient size and/or use of iterative reconstruction technique. COMPARISON:  None Available. FINDINGS: Brain: Normal cerebral volume. No midline shift, ventriculomegaly, mass effect, evidence of mass lesion, intracranial hemorrhage or evidence of cortically based acute infarction. Patchy and confluent periatrial white matter hypodensity is asymmetric, stable. Gray-white differentiation stable and otherwise within normal limits. Vascular: No suspicious intracranial vascular hyperdensity. Calcified atherosclerosis at the skull base. Skull: Hyperostosis of the calvarium and osteopenia again noted. Chronic bilateral nasal bone fractures appear stable. No acute osseous abnormality identified. Sinuses/Orbits: Regressed left maxillary retention cysts since January. Other Visualized paranasal sinuses and mastoids are stable and well aerated. Other: No acute orbit or scalp soft tissue injury identified. IMPRESSION: 1. No acute traumatic injury identified. 2. Stable non contrast CT appearance of chronic cerebral white matter disease. 3. Chronic nasal bone fractures. Electronically Signed   By: VEAR Hurst M.D.   On: 06/08/2024 06:57   CT Cervical Spine Wo Contrast Result Date: 06/08/2024 CLINICAL DATA:  68 year old female status post fall at home. Left femur fracture. Pain. EXAM: CT CERVICAL SPINE WITHOUT CONTRAST TECHNIQUE: Multidetector CT imaging of the cervical spine was performed without intravenous contrast. Multiplanar CT image reconstructions were also generated. RADIATION DOSE REDUCTION: This exam was performed according to the departmental dose-optimization program which includes automated exposure control, adjustment of the mA and/or kV according to patient size and/or use of iterative reconstruction technique.  COMPARISON:  Head CT today.  Cervical spine CT 11/26/2023. FINDINGS: Alignment: Stable lordosis. Cervicothoracic junction alignment is within normal limits. Bilateral posterior element alignment is within normal limits. Skull base and vertebrae: Chronic osteopenia. Visualized skull base is intact. No atlanto-occipital dissociation. C1 and C2 appear intact and aligned. No acute osseous abnormality identified. Soft tissues and spinal canal: No prevertebral fluid or swelling. No visible canal hematoma. Negative for age visible noncontrast neck soft tissues. Disc levels: Chronic right side facet arthropathy in the mid cervical spine. Bulky chronic endplate spurring C5 through C7. Associated bulky chronic leftward C6-C7 disc and endplate degeneration. But otherwise capacious CT appearance of the spinal canal. Upper chest: Osteopenia. Visible upper thoracic levels appear intact. Negative lung apices. IMPRESSION: 1. No acute traumatic injury identified in the cervical spine. 2. Osteopenia.  Chronic cervical spine degeneration. Electronically Signed   By: VEAR Hurst M.D.   On: 06/08/2024 06:21   DG FEMUR MIN 2 VIEWS LEFT Result Date: 06/08/2024 CLINICAL DATA:  68 year old female status post fall at home.  Pain. EXAM: LEFT FEMUR 2 VIEWS COMPARISON:  None Available. FINDINGS: Four views of the left femur 0542 hours. Long segment spiral and comminuted left femur fracture beginning at the intertrochanteric segment and continuing distally a distance of about 15 cm. Large medial butterfly fragment displaced medially more than 1 full shaft width. Left femoral head appears to remain normally located. Grossly intact visible left hemipelvis. Distal left femur, alignment at the left knee appear intact. Tricompartmental left knee joint degeneration. Grossly intact visible proximal tibia and fibula. IMPRESSION: Long segment spiral and comminuted left femur fracture from the intertrochanteric segment continuing 15 cm distally into the  midshaft. Large and medially displaced comminution fragment. Electronically Signed   By: VEAR Hurst M.D.   On: 06/08/2024 05:53    Anti-infectives (From admission, onward)    None  Assessment/Plan GLF 7/18 L femur fx - Ortho consult Presacral hematoma with possible occult sacral fx - Ortho consulted. Trend hgb. Monitor to see if foley needed T11-T12 fx - NSGY consult, keep flat and log roll. They have ordered MRI and plan for OR later today.  Neck pain - CT neg for fx. Cont C-Collar. Discussed with NSGY. They are obtaining MRI C-Spine L elbow pain - xray R shoulder pain - xray R knee pain - xray Hx CAD Hx CHF Hx COPD Hx Cirrhosis Hx Fibromyalgia Hx HTN - reports resolved since she had her duodenal switch Hx HLD -  reports resolved since she had her duodenal switch Hx Hypothyroidism Hx OSA -  reports no longer on CPAP since she had her duodenal switch Hx Pulm HTN Hx Seizures Hx CVA   Hx duodenal switch - avoid NSAIDs, multi. CBG monitoring given she reports episodes of hypoglycemia since surgery.  FEN - NPO for likely OR today. mIVF VTE - SCDs, on hold ID - None currently.  Foley - None, spont void. Send UA Dispo - Admit to trauma, ICU. Restart home meds when reconciled by pharmacy.   I reviewed nursing notes, last 24 h vitals and pain scores, last 48 h intake and output, last 24 h labs and trends, and last 24 h imaging results.  Ozell CHRISTELLA Shaper, [facility] 06/08/2024, 8:02 AM Please see Amion for pager number during day hours 7:00am-4:30pm

## 2024-06-08 NOTE — Anesthesia Procedure Notes (Signed)
 Arterial Line Insertion Start/End7/18/2025 4:17 AM Performed by: Leonce Athens, MD, anesthesiologist  Patient location: OR. Preanesthetic checklist: patient identified, IV checked, surgical consent, monitors and equipment checked, pre-op evaluation, timeout performed and anesthesia consent Right, radial was placed Catheter size: 20 G Hand hygiene performed , maximum sterile barriers used  and Seldinger technique used  Attempts: 2 Procedure performed without using ultrasound guided technique. Following insertion, dressing applied and Biopatch. Post procedure assessment: normal  Patient tolerated the procedure well with no immediate complications.

## 2024-06-09 LAB — TYPE AND SCREEN
ABO/RH(D): A POS
Antibody Screen: NEGATIVE
Unit division: 0
Unit division: 0

## 2024-06-09 LAB — BPAM RBC
Blood Product Expiration Date: 202508122359
Blood Product Expiration Date: 202508162359
ISSUE DATE / TIME: 202507181639
ISSUE DATE / TIME: 202507181757
Unit Type and Rh: 6200
Unit Type and Rh: 6200

## 2024-06-09 LAB — BASIC METABOLIC PANEL WITH GFR
Anion gap: 8 (ref 5–15)
BUN: 17 mg/dL (ref 8–23)
CO2: 22 mmol/L (ref 22–32)
Calcium: 6.9 mg/dL — ABNORMAL LOW (ref 8.9–10.3)
Chloride: 110 mmol/L (ref 98–111)
Creatinine, Ser: 1.01 mg/dL — ABNORMAL HIGH (ref 0.44–1.00)
GFR, Estimated: >60 mL/min (ref >60)
Glucose, Bld: 162 mg/dL — ABNORMAL HIGH (ref 70–99)
Potassium: 3.8 mmol/L (ref 3.5–5.1)
Sodium: 140 mmol/L (ref 135–145)

## 2024-06-09 LAB — CBC
HCT: 32.3 % — ABNORMAL LOW (ref 36.0–46.0)
Hemoglobin: 11 g/dL — ABNORMAL LOW (ref 12.0–15.0)
MCH: 33.4 pg (ref 26.0–34.0)
MCHC: 34.1 g/dL (ref 30.0–36.0)
MCV: 98.2 fL (ref 80.0–100.0)
Platelets: 85 K/uL — ABNORMAL LOW (ref 150–400)
RBC: 3.29 MIL/uL — ABNORMAL LOW (ref 3.87–5.11)
RDW: 15.6 % — ABNORMAL HIGH (ref 11.5–15.5)
WBC: 6.5 K/uL (ref 4.0–10.5)
nRBC: 0 % (ref 0.0–0.2)

## 2024-06-09 LAB — HIV ANTIBODY (ROUTINE TESTING W REFLEX): HIV Screen 4th Generation wRfx: NONREACTIVE

## 2024-06-09 LAB — GLUCOSE, CAPILLARY
Glucose-Capillary: 150 mg/dL — ABNORMAL HIGH (ref 70–99)
Glucose-Capillary: 149 mg/dL — ABNORMAL HIGH (ref 70–99)
Glucose-Capillary: 221 mg/dL — ABNORMAL HIGH (ref 70–99)
Glucose-Capillary: 135 mg/dL — ABNORMAL HIGH (ref 70–99)
Glucose-Capillary: 144 mg/dL — ABNORMAL HIGH (ref 70–99)

## 2024-06-09 LAB — MRSA NEXT GEN BY PCR, NASAL: MRSA by PCR Next Gen: NOT DETECTED

## 2024-06-09 LAB — VITAMIN D 25 HYDROXY (VIT D DEFICIENCY, FRACTURES): Vit D, 25-Hydroxy: 19.97 ng/mL — ABNORMAL LOW (ref 30–100)

## 2024-06-09 MED ORDER — TRIAMCINOLONE ACETONIDE 0.1 % EX CREA
1.0000 | TOPICAL_CREAM | Freq: Two times a day (BID) | CUTANEOUS | Status: DC | PRN
  Administered 2024-06-09: 1 via TOPICAL
  Filled 2024-06-09: qty 15

## 2024-06-09 NOTE — Progress Notes (Signed)
 Orthopaedic Trauma Service Progress Note  Patient ID: Kimberly [name] MRN: [id number] DOB/AGE: 06-06-1956 68 y.o.  Subjective:  Watched her mobilize with therapy, did well  L femur feels better than her back   Was just started on Prolia  last year and has only had 2 doses   ROS As above  Today's  total administered Morphine  Milligram Equivalents: 15 Yesterday's total administered Morphine  Milligram Equivalents: 182  Objective:   VITALS:   Vitals:   06/09/24 0600 06/09/24 0641 06/09/24 0700 06/09/24 0800  BP: 110/69  (!) 96/58 (!) 103/57  Pulse: 70 71 65 69  Resp: (!) 6 15 11 13   Temp:    (!) 97.5 F (36.4 C)  TempSrc:    Oral  SpO2: 97% 99% 97% 98%  Weight:  63.3 kg    Height:  5' (1.524 m)      Estimated body mass index is 27.25 kg/m as calculated from the following:   Height as of this encounter: 5' (1.524 m).   Weight as of this encounter: 63.3 kg.   Intake/Output      07/18 0701 07/19 0700 07/19 0701 07/20 0700   I.V. (mL/kg) 2500 (39.5) 6 (0.1)   Blood 630    IV Piggyback 450 100   Total Intake(mL/kg) 3580 (56.6) 106 (1.7)   Urine (mL/kg/hr) 1250 (0.8)    Blood 150    Total Output 1400    Net +2180 +106          LABS  Results for orders placed or performed during the hospital encounter of 06/08/24 (from the past 24 hours)  CBG monitoring, ED     Status: Abnormal   Collection Time: 06/08/24 12:25 PM  Result Value Ref Range   Glucose-Capillary 113 (H) 70 - 99 mg/dL  Surgical pcr screen     Status: Abnormal   Collection Time: 06/08/24  3:17 PM   Specimen: Nasal Mucosa; Nasal Swab  Result Value Ref Range   MRSA, PCR NOT DETECTED (A) NEGATIVE   Staphylococcus aureus NOT DETECTED (A) NEGATIVE  I-STAT 7, (LYTES, BLD GAS, ICA, H+H)     Status: Abnormal   Collection Time: 06/08/24  4:24 PM  Result Value Ref Range   pH, Arterial 7.402 7.35 - 7.45   pCO2 arterial 35.4 32 -  48 mmHg   pO2, Arterial 519 (H) 83 - 108 mmHg   Bicarbonate 22.0 20.0 - 28.0 mmol/L   TCO2 23 22 - 32 mmol/L   O2 Saturation 100 %   Acid-base deficit 2.0 0.0 - 2.0 mmol/L   Sodium 138 135 - 145 mmol/L   Potassium 4.1 3.5 - 5.1 mmol/L   Calcium , Ion 1.02 (L) 1.15 - 1.40 mmol/L   HCT 25.0 (L) 36.0 - 46.0 %   Hemoglobin 8.5 (L) 12.0 - 15.0 g/dL   Sample type ARTERIAL   Prepare RBC (crossmatch)     Status: None   Collection Time: 06/08/24  4:28 PM  Result Value Ref Range   Order Confirmation      ORDER PROCESSED BY BLOOD BANK Performed at [facility], [address], [place], [place] [address]   I-STAT 7, (LYTES, BLD GAS, ICA, H+H)     Status: Abnormal   Collection Time: 06/08/24  5:40 PM  Result Value Ref Range   pH,  Arterial 7.404 7.35 - 7.45   pCO2 arterial 31.9 (L) 32 - 48 mmHg   pO2, Arterial 270 (H) 83 - 108 mmHg   Bicarbonate 20.3 20.0 - 28.0 mmol/L   TCO2 21 (L) 22 - 32 mmol/L   O2 Saturation 100 %   Acid-base deficit 4.0 (H) 0.0 - 2.0 mmol/L   Sodium 139 135 - 145 mmol/L   Potassium 4.1 3.5 - 5.1 mmol/L   Calcium , Ion 0.95 (L) 1.15 - 1.40 mmol/L   HCT 25.0 (L) 36.0 - 46.0 %   Hemoglobin 8.5 (L) 12.0 - 15.0 g/dL   Patient temperature 64.6 C    Sample type ARTERIAL   Prepare RBC (crossmatch)     Status: None   Collection Time: 06/08/24  5:51 PM  Result Value Ref Range   Order Confirmation      ORDER PROCESSED BY BLOOD BANK Performed at [facility], [address], [place], [place] [address]   I-STAT 7, (LYTES, BLD GAS, ICA, H+H)     Status: Abnormal   Collection Time: 06/08/24  7:32 PM  Result Value Ref Range   pH, Arterial 7.303 (L) 7.35 - 7.45   pCO2 arterial 41.3 32 - 48 mmHg   pO2, Arterial 322 (H) 83 - 108 mmHg   Bicarbonate 20.5 20.0 - 28.0 mmol/L   TCO2 22 22 - 32 mmol/L   O2 Saturation 100 %   Acid-base deficit 6.0 (H) 0.0 - 2.0 mmol/L   Sodium 141 135 - 145 mmol/L   Potassium 3.8 3.5 - 5.1 mmol/L   Calcium , Ion 0.98 (L) 1.15 - 1.40  mmol/L   HCT 32.0 (L) 36.0 - 46.0 %   Hemoglobin 10.9 (L) 12.0 - 15.0 g/dL   Sample type ARTERIAL   Glucose, capillary     Status: Abnormal   Collection Time: 06/08/24 11:22 PM  Result Value Ref Range   Glucose-Capillary 163 (H) 70 - 99 mg/dL  HIV Antibody (routine testing w rflx)     Status: None   Collection Time: 06/08/24 11:29 PM  Result Value Ref Range   HIV Screen 4th Generation wRfx Non Reactive Non Reactive  Basic metabolic panel     Status: Abnormal   Collection Time: 06/08/24 11:29 PM  Result Value Ref Range   Sodium 140 135 - 145 mmol/L   Potassium 3.8 3.5 - 5.1 mmol/L   Chloride 110 98 - 111 mmol/L   CO2 22 22 - 32 mmol/L   Glucose, Bld 162 (H) 70 - 99 mg/dL   BUN 17 8 - 23 mg/dL   Creatinine, Ser 8.98 (H) 0.44 - 1.00 mg/dL   Calcium  6.9 (L) 8.9 - 10.3 mg/dL   GFR, Estimated >39 >39 mL/min   Anion gap 8 5 - 15  VITAMIN D  25 Hydroxy (Vit-D Deficiency, Fractures)     Status: Abnormal   Collection Time: 06/08/24 11:29 PM  Result Value Ref Range   Vit D, 25-Hydroxy 19.97 (L) 30 - 100 ng/mL  MRSA Next Gen by PCR, Nasal     Status: None   Collection Time: 06/09/24  1:12 AM   Specimen: Nasal Mucosa; Nasal Swab  Result Value Ref Range   MRSA by PCR Next Gen NOT DETECTED NOT DETECTED  CBC     Status: Abnormal   Collection Time: 06/09/24  1:12 AM  Result Value Ref Range   WBC 6.5 4.0 - 10.5 K/uL   RBC 3.29 (L) 3.87 - 5.11 MIL/uL   Hemoglobin 11.0 (L)  12.0 - 15.0 g/dL   HCT 67.6 (L) 63.9 - 53.9 %   MCV 98.2 80.0 - 100.0 fL   MCH 33.4 26.0 - 34.0 pg   MCHC 34.1 30.0 - 36.0 g/dL   RDW 84.3 (H) 88.4 - 84.4 %   Platelets 85 (L) 150 - 400 K/uL   nRBC 0.0 0.0 - 0.2 %  Glucose, capillary     Status: Abnormal   Collection Time: 06/09/24  3:33 AM  Result Value Ref Range   Glucose-Capillary 150 (H) 70 - 99 mg/dL  Glucose, capillary     Status: Abnormal   Collection Time: 06/09/24  7:38 AM  Result Value Ref Range   Glucose-Capillary 149 (H) 70 - 99 mg/dL     PHYSICAL  EXAM:   Gen: NAD, looks good  Lungs: unlabored Ext:       Left Lower Extremity Dressing is clean, dry and intact  Extremity is warm  No DCT  Compartments are soft  No pain out of proportion with passive stretching of his toes or ankle  DPN, SPN, TN sensory functions are intact  EHL, FHL, lesser toe motor functions intact  Ankle flexion, extension, inversion eversion grossly intact   + DP pulse   Assessment/Plan: 1 Day Post-Op   Principal Problem:   Thoracic spine fracture (HCC)   Anti-infectives (From admission, onward)    Start     Dose/Rate Route Frequency Ordered Stop   06/09/24 0600  ceFAZolin  (ANCEF ) IVPB 2g/100 mL premix        2 g 200 mL/hr over 30 Minutes Intravenous On call to O.R. 06/08/24 1516 06/08/24 1619   06/09/24 0000  ceFAZolin  (ANCEF ) IVPB 2g/100 mL premix        2 g 200 mL/hr over 30 Minutes Intravenous Every 6 hours 06/08/24 2204 06/09/24 0603   06/08/24 2215  ceFAZolin  (ANCEF ) IVPB 2g/100 mL premix  Status:  Discontinued        2 g 200 mL/hr over 30 Minutes Intravenous Every 8 hours 06/08/24 2204 06/08/24 2242     .  POD/HD#: 53  68 year old female with baseline osteoporosis status post fall with left proximal femoral shaft fracture and T11-12 chance fractures  - fall, osteoporosis   - Left proximal femoral shaft fracture with significant comminution s/p IMN  Touchdown weightbearing left leg with assistance  No motion restrictions to hip or knee  Dressing changes as needed starting on 06/11/2024  Ice as needed  Therapy evaluations  - DVT/PE prophylaxis:  Per TS and NSGY - ID:   Perioperative antibiotics  - Metabolic Bone Disease:  Known osteoporosis  Vitamin D  deficiency   Supplement  - Dispo:  Continue with therapies  Ortho issues addressed  Follow-up with orthopedics in 2 weeks  Francis MICAEL Mt, PA-C [phone] (C) 06/09/2024, 11:54 AM  [facility] [address] [place] [address] [phone]  [id number] (F)    After 5pm and on the weekends please log on to Amion, go to orthopaedics and the look under the [facility] Call for the provider(s) on call. You can also call our office at [phone] and then follow the prompts to be connected to the call team.  Patient ID: Kimberly [name], female   DOB: Dec 25, 1955, 68 y.o.   MRN: [id number]

## 2024-06-09 NOTE — Plan of Care (Signed)
  Problem: Education: Goal: Knowledge of General Education information will improve Description: Including pain rating scale, medication(s)/side effects and non-pharmacologic comfort measures Outcome: Progressing   Problem: Clinical Measurements: Goal: Ability to maintain clinical measurements within normal limits will improve Outcome: Progressing Goal: Respiratory complications will improve Outcome: Progressing Goal: Cardiovascular complication will be avoided Outcome: Progressing   Problem: Activity: Goal: Risk for activity intolerance will decrease Outcome: Progressing   Problem: Coping: Goal: Level of anxiety will decrease Outcome: Progressing   Problem: Pain Managment: Goal: General experience of comfort will improve and/or be controlled Outcome: Progressing   Problem: Safety: Goal: Ability to remain free from injury will improve Outcome: Progressing

## 2024-06-09 NOTE — Anesthesia Postprocedure Evaluation (Signed)
 Anesthesia Post Note  Patient: Kimberly [name]  Procedure(s) Performed: INSERTION, INTRAMEDULLARY ROD, FEMUR (Left: Leg Upper) OPEN REDUCTION INTERNAL FIXATION FEMORAL SHAFT FRACTURE (Left: Leg Upper) THORACIC ELEVEN-LUMBAR ONE PERCUTANEOUS PEDICLE SCREW (Spine Thoracic) APPLICATION OF O-ARM     Patient location during evaluation: PACU Anesthesia Type: General Level of consciousness: sedated and patient cooperative Pain management: pain level controlled Vital Signs Assessment: post-procedure vital signs reviewed and stable Respiratory status: spontaneous breathing Cardiovascular status: stable Anesthetic complications: no   No notable events documented.  Last Vitals:  Vitals:   06/09/24 1918 06/09/24 2000  BP: (!) 131/57 100/69  Pulse: 71 67  Resp: 19 12  Temp:  37 C  SpO2: 100% 99%    Last Pain:  Vitals:   06/09/24 2112  TempSrc:   PainSc: 8                  Norleen Pope

## 2024-06-09 NOTE — Progress Notes (Signed)
Inpatient Rehab Admissions Coordinator:   Per therapy recommendations patient was screened for CIR candidacy by Megan Salon, MS, CCC-SLP. At this time, Pt. Appears to be a a potential candidate for CIR. I will place   order for rehab consult per protocol for full assessment. Please contact me any with questions.  Megan Salon, MS, CCC-SLP Rehab Admissions Coordinator  [phone] (celll) [phone] (office)

## 2024-06-09 NOTE — Progress Notes (Signed)
 Trauma/Critical Care Follow Up Note  Subjective:    Overnight Issues:   Objective:  Vital signs for last 24 hours: Temp:  [94.6 F (34.8 C)-98.6 F (37 C)] 98 F (36.7 C) (07/19 0000) Pulse Rate:  [77-90] 90 (07/18 2300) Resp:  [8-20] 8 (07/18 2300) BP: (102-143)/(59-89) 121/59 (07/18 2215) SpO2:  [90 %-100 %] 90 % (07/18 2300) Arterial Line BP: (134-158)/(58-75) 142/63 (07/18 2300) Weight:  [63.3 kg] 63.3 kg (07/19 0641)  Hemodynamic parameters for last 24 hours:    Intake/Output from previous day: 07/18 0701 - 07/19 0700 In: 3580 [I.V.:2500; Blood:630; IV Piggyback:450] Out: 1400 [Urine:1250; Blood:150]  Intake/Output this shift: Total I/O In: 1600 [I.V.:1500; IV Piggyback:100] Out: 1100 [Urine:1050; Blood:50]  Vent settings for last 24 hours:    Physical Exam:  Gen: comfortable, no distress Neuro: follows commands, alert, communicative HEENT: PERRL Neck: supple CV: RRR Pulm: unlabored breathing on RA Abd: soft, NT  ,   GU: urine clear and yellow, foley removed this AM has not voided yet Extr: wwp, no edema  Results for orders placed or performed during the hospital encounter of 06/08/24 (from the past 24 hours)  CBG monitoring, ED     Status: Abnormal   Collection Time: 06/08/24 12:25 PM  Result Value Ref Range   Glucose-Capillary 113 (H) 70 - 99 mg/dL  Surgical pcr screen     Status: Abnormal   Collection Time: 06/08/24  3:17 PM   Specimen: Nasal Mucosa; Nasal Swab  Result Value Ref Range   MRSA, PCR NOT DETECTED (A) NEGATIVE   Staphylococcus aureus NOT DETECTED (A) NEGATIVE  I-STAT 7, (LYTES, BLD GAS, ICA, H+H)     Status: Abnormal   Collection Time: 06/08/24  4:24 PM  Result Value Ref Range   pH, Arterial 7.402 7.35 - 7.45   pCO2 arterial 35.4 32 - 48 mmHg   pO2, Arterial 519 (H) 83 - 108 mmHg   Bicarbonate 22.0 20.0 - 28.0 mmol/L   TCO2 23 22 - 32 mmol/L   O2 Saturation 100 %   Acid-base deficit 2.0 0.0 - 2.0 mmol/L   Sodium 138 135 - 145  mmol/L   Potassium 4.1 3.5 - 5.1 mmol/L   Calcium , Ion 1.02 (L) 1.15 - 1.40 mmol/L   HCT 25.0 (L) 36.0 - 46.0 %   Hemoglobin 8.5 (L) 12.0 - 15.0 g/dL   Sample type ARTERIAL   Prepare RBC (crossmatch)     Status: None   Collection Time: 06/08/24  4:28 PM  Result Value Ref Range   Order Confirmation      ORDER PROCESSED BY BLOOD BANK Performed at [facility], [address] [place], [place] [address]   I-STAT 7, (LYTES, BLD GAS, ICA, H+H)     Status: Abnormal   Collection Time: 06/08/24  5:40 PM  Result Value Ref Range   pH, Arterial 7.404 7.35 - 7.45   pCO2 arterial 31.9 (L) 32 - 48 mmHg   pO2, Arterial 270 (H) 83 - 108 mmHg   Bicarbonate 20.3 20.0 - 28.0 mmol/L   TCO2 21 (L) 22 - 32 mmol/L   O2 Saturation 100 %   Acid-base deficit 4.0 (H) 0.0 - 2.0 mmol/L   Sodium 139 135 - 145 mmol/L   Potassium 4.1 3.5 - 5.1 mmol/L   Calcium , Ion 0.95 (L) 1.15 - 1.40 mmol/L   HCT 25.0 (L) 36.0 - 46.0 %   Hemoglobin 8.5 (L) 12.0 - 15.0 g/dL   Patient temperature 35.3  C    Sample type ARTERIAL   Prepare RBC (crossmatch)     Status: None   Collection Time: 06/08/24  5:51 PM  Result Value Ref Range   Order Confirmation      ORDER PROCESSED BY BLOOD BANK Performed at [facility], [address] [place], [place] [address]   I-STAT 7, (LYTES, BLD GAS, ICA, H+H)     Status: Abnormal   Collection Time: 06/08/24  7:32 PM  Result Value Ref Range   pH, Arterial 7.303 (L) 7.35 - 7.45   pCO2 arterial 41.3 32 - 48 mmHg   pO2, Arterial 322 (H) 83 - 108 mmHg   Bicarbonate 20.5 20.0 - 28.0 mmol/L   TCO2 22 22 - 32 mmol/L   O2 Saturation 100 %   Acid-base deficit 6.0 (H) 0.0 - 2.0 mmol/L   Sodium 141 135 - 145 mmol/L   Potassium 3.8 3.5 - 5.1 mmol/L   Calcium , Ion 0.98 (L) 1.15 - 1.40 mmol/L   HCT 32.0 (L) 36.0 - 46.0 %   Hemoglobin 10.9 (L) 12.0 - 15.0 g/dL   Sample type ARTERIAL   Glucose, capillary     Status: Abnormal   Collection Time: 06/08/24 11:22 PM  Result Value Ref  Range   Glucose-Capillary 163 (H) 70 - 99 mg/dL  HIV Antibody (routine testing w rflx)     Status: None   Collection Time: 06/08/24 11:29 PM  Result Value Ref Range   HIV Screen 4th Generation wRfx Non Reactive Non Reactive  Basic metabolic panel     Status: Abnormal   Collection Time: 06/08/24 11:29 PM  Result Value Ref Range   Sodium 140 135 - 145 mmol/L   Potassium 3.8 3.5 - 5.1 mmol/L   Chloride 110 98 - 111 mmol/L   CO2 22 22 - 32 mmol/L   Glucose, Bld 162 (H) 70 - 99 mg/dL   BUN 17 8 - 23 mg/dL   Creatinine, Ser 8.98 (H) 0.44 - 1.00 mg/dL   Calcium  6.9 (L) 8.9 - 10.3 mg/dL   GFR, Estimated >39 >39 mL/min   Anion gap 8 5 - 15  VITAMIN D  25 Hydroxy (Vit-D Deficiency, Fractures)     Status: Abnormal   Collection Time: 06/08/24 11:29 PM  Result Value Ref Range   Vit D, 25-Hydroxy 19.97 (L) 30 - 100 ng/mL  MRSA Next Gen by PCR, Nasal     Status: None   Collection Time: 06/09/24  1:12 AM   Specimen: Nasal Mucosa; Nasal Swab  Result Value Ref Range   MRSA by PCR Next Gen NOT DETECTED NOT DETECTED  CBC     Status: Abnormal   Collection Time: 06/09/24  1:12 AM  Result Value Ref Range   WBC 6.5 4.0 - 10.5 K/uL   RBC 3.29 (L) 3.87 - 5.11 MIL/uL   Hemoglobin 11.0 (L) 12.0 - 15.0 g/dL   HCT 67.6 (L) 63.9 - 53.9 %   MCV 98.2 80.0 - 100.0 fL   MCH 33.4 26.0 - 34.0 pg   MCHC 34.1 30.0 - 36.0 g/dL   RDW 84.3 (H) 88.4 - 84.4 %   Platelets 85 (L) 150 - 400 K/uL   nRBC 0.0 0.0 - 0.2 %  Glucose, capillary     Status: Abnormal   Collection Time: 06/09/24  3:33 AM  Result Value Ref Range   Glucose-Capillary 150 (H) 70 - 99 mg/dL    Assessment & Plan: The plan of care was discussed with  the bedside nurse for the night, who is in agreement with this plan and no additional concerns were raised.   Present on Admission:  Thoracic spine fracture (HCC)    LOS: 1 day   Additional comments:I reviewed the patient's new clinical lab test results.   and I reviewed the patients new imaging  test results.    GLF 7/18 L femur fx - Ortho consult Presacral hematoma with possible occult sacral fx - Ortho consulted. Trend hgb. Monitor to see if foley needed T11-T12 fx - NSGY consult, keep flat and log roll. They have ordered MRI and plan for OR later today.  Neck pain - CT neg for fx. Cont C-Collar. Discussed with NSGY. They are obtaining MRI C-Spine L elbow pain - xray R shoulder pain - xray R knee pain - xray Hx CAD Hx CHF Hx COPD Hx Cirrhosis Hx Fibromyalgia Hx HTN - reports resolved since she had her duodenal switch Hx HLD -  reports resolved since she had her duodenal switch Hx Hypothyroidism Hx OSA -  reports no longer on CPAP since she had her duodenal switch Hx Pulm HTN Hx Seizures Hx CVA   Hx duodenal switch - avoid NSAIDs, multi. CBG monitoring given she reports episodes of hypoglycemia since surgery.  FEN - carb mod diet VTE - SCDs, LMWH when okay with NSGY ID - None currently.  Foley - foley out this AM Dispo - 4NP   Dreama GEANNIE Hanger, MD Trauma & General Surgery Please use [email or web address] to contact on call provider  06/09/2024  *Care during the described time interval was provided by me. I have reviewed this patient's available data, including medical history, events of note, physical examination and test results as part of my evaluation.

## 2024-06-09 NOTE — Progress Notes (Signed)
 Pt w/ orders to ambulate POD #0, unable to complete order as pt is currently too drowsy to safely ambulate d/t operative sedation/analgesia. Will continue to monitor.

## 2024-06-09 NOTE — Progress Notes (Signed)
 Subjective: Patient reports back and hip pain.  Currently working with physical therapy and Occupational Therapy.  Objective: Vital signs in last 24 hours: Temp:  [94.6 F (34.8 C)-99.5 F (37.5 C)] 97.5 F (36.4 C) (07/19 0800) Pulse Rate:  [65-90] 69 (07/19 0800) Resp:  [2-40] 13 (07/19 0800) BP: (93-143)/(56-89) 103/57 (07/19 0800) SpO2:  [90 %-100 %] 98 % (07/19 0800) Arterial Line BP: (111-158)/(51-75) 121/58 (07/19 0400) Weight:  [63.3 kg] 63.3 kg (07/19 0641)  Intake/Output from previous day: 07/18 0701 - 07/19 0700 In: 3580 [I.V.:2500; Blood:630; IV Piggyback:450] Out: 1400 [Urine:1250; Blood:150] Intake/Output this shift: Total I/O In: 100 [IV Piggyback:100] Out: -   No acute distress TLSO brace in place Sitting in chair Moving lower extremities well with no apparent deficits  Lab Results: Recent Labs    06/08/24 0529 06/08/24 1624 06/08/24 1932 06/09/24 0112  WBC 7.0  --   --  6.5  HGB 11.6*   < > 10.9* 11.0*  HCT 34.2*   < > 32.0* 32.3*  PLT 136*  --   --  85*   < > = values in this interval not displayed.   BMET Recent Labs    06/08/24 0529 06/08/24 1624 06/08/24 1932 06/08/24 2329  NA 138   < > 141 140  K 3.6   < > 3.8 3.8  CL 104  --   --  110  CO2 22  --   --  22  GLUCOSE 108*  --   --  162*  BUN 14  --   --  17  CREATININE 1.07*  --   --  1.01*  CALCIUM  8.5*  --   --  6.9*   < > = values in this interval not displayed.    Studies/Results: DG FEMUR PORT MIN 2 VIEWS LEFT Result Date: 06/09/2024 CLINICAL DATA:  Post op femur fracture EXAM: LEFT FEMUR PORTABLE 2 VIEWS COMPARISON:  06/08/2024 FINDINGS: Interval intramedullary rodding and screw fixation of the left femur for comminuted fracture involving the proximal left femur with decreased fracture displacement and angulation compared to prior. Gas in the soft tissues consistent with recent surgery IMPRESSION: Interval intramedullary rodding and screw fixation of proximal left femur fracture  with decreased fracture displacement and angulation. Electronically Signed   By: Luke Bun M.D.   On: 06/09/2024 00:09   DG THORACOLUMBAR SPINE Result Date: 06/08/2024 EXAM: XR Intraoperative Imaging, 2 Views TECHNIQUE: Fluoroscopy was provided by the radiology department for procedure. Radiologist was not present during examination. FLUOROSCOPY DOSE AND TYPE: Fluoroscopy time: 6.7 seconds Radiation Dose Index: 2.03 mGy COMPARISON: None available. CLINICAL HISTORY: [id number] Surgery, elective [id number]. Surgery: THORACIC ELEVEN-LUMBAR ONE PERCUTANEOUS PEDICLE SCREW (Spine Thoracic) ; RSTO: JR; Fluoro time: 6.7 secs ; mGy: 2.03 FINDINGS: Intraoperative fluoroscopic images during posterior fusion of the lower thoracic/upper lumbar spine (T11-L1). IMPRESSION: 1. Intraoperative fluoroscopic spot images, as above. NOTE: Intraoperative fluoroscopic spot images as above. Please refer to the intraoperative report for full details. Electronically signed by: Pinkie Pebbles MD 06/08/2024 08:55 PM EDT RP Workstation: [id number]   DG C-Arm 1-60 Min-No Report Result Date: 06/08/2024 Fluoroscopy was utilized by the requesting physician.  No radiographic interpretation.   DG O-ARM IMAGE ONLY/NO REPORT Result Date: 06/08/2024 There is no Radiologist interpretation  for this exam.  DG FEMUR MIN 2 VIEWS LEFT Result Date: 06/08/2024 CLINICAL DATA:  Elective surgery. EXAM: LEFT FEMUR 2 VIEWS COMPARISON:  Preoperative imaging FINDINGS: Thirteen fluoroscopic spot views of the femur are submitted from the  operating room. Femoral intramedullary nail with trans trochanteric and distal locking screw fixation traversing proximal femur fracture. Fluoroscopy time 98.2 seconds. Dose 14.48 mGy. IMPRESSION: Intraoperative fluoroscopy during proximal femur fracture fixation. Electronically Signed   By: Andrea Gasman M.D.   On: 06/08/2024 19:49   DG C-Arm 1-60 Min-No Report Result Date: 06/08/2024 Fluoroscopy was utilized by the  requesting physician.  No radiographic interpretation.   DG C-Arm 1-60 Min-No Report Result Date: 06/08/2024 Fluoroscopy was utilized by the requesting physician.  No radiographic interpretation.   MR THORACIC SPINE WO CONTRAST Result Date: 06/08/2024 CLINICAL DATA:  Spine fracture, thoracic, traumatic EXAM: MRI THORACIC SPINE WITHOUT CONTRAST TECHNIQUE: Multiplanar, multisequence MR imaging of the thoracic spine was performed. No intravenous contrast was administered. COMPARISON:  CT of the thoracic spine dated June 08, 2024. FINDINGS: Alignment: Mild focal kyphosis at T11-12 secondary to a fracture of the T12 vertebral body and extension through the posterior elements of T11, which is more readily apparent on the previous CT. The other thoracic vertebrae are intact and normal in signal intensity. Vertebrae: Chance fracture of T12 with extension through the spinous process of T11. The T12 vertebral body has lost approximately 30% of its height anteriorly. There is bone marrow edema present within the superior vertebral body and there is increased T2 signal within the T11-12 disc space. There is no paraspinous or epidural hematoma demonstrated. The spinal canal and neural foramina are patent at T11 and T12. Cord:  Normal in morphology and signal intensity. Paraspinal and other soft tissues: There is probable dependent atelectasis within the right lower lobe and to lesser extent the left lower lobe. The paraspinous soft tissues are unremarkable, excepting for simple cysts within the right kidney. Disc levels: There is mild disc bulging and bilateral facet hypertrophy present at T10-11, which is causing mild-to-moderate central spinal canal stenosis, but no spinal cord impingement. There are also facet hypertrophic changes present on the right at T9-10, causing mild right-sided spinal canal stenosis. There is no spinal cord or nerve root impingement. The remainder of the thoracic disc space levels are  unremarkable. IMPRESSION: 1. There is an unusual Chance type fracture involving the T12 vertebral body and the spinous process of T11, with mild focal kyphosis, but no spinal cord or nerve root impingement. There is also no paraspinous or epidural hematoma evident. 2. Mild to moderate central spinal canal stenosis at T10-11 secondary did degenerative disc disease and facet arthrosis. Electronically Signed   By: Evalene Coho M.D.   On: 06/08/2024 15:31   MR CERVICAL SPINE WO CONTRAST Result Date: 06/08/2024 CLINICAL DATA:  Cervical radiculopathy, no red flags EXAM: MRI CERVICAL SPINE WITHOUT CONTRAST TECHNIQUE: Multiplanar, multisequence MR imaging of the cervical spine was performed. No intravenous contrast was administered. COMPARISON:  CT of the cervical spine dated June 08, 2024. FINDINGS: Alignment: Normal. Vertebrae: Intact. No osseous lesions or evidence of acute traumatic injury. There are anterior osteophytes at C5-6 and C6-7. Cord: Normal in morphology and signal intensity. Posterior Fossa, vertebral arteries, paraspinal tissues: Negative. Disc levels: There is posterior disc bulging and endplate ridging at C6-7, causing mild-to-moderate central spinal canal stenosis. There is also mild left neural foraminal stenosis, but no spinal cord or nerve root compression. IMPRESSION: 1. Chronic degenerative disc disease at C6-7 with mild-to-moderate central spinal canal stenosis and mild left neural foraminal stenosis. Electronically Signed   By: Evalene Coho M.D.   On: 06/08/2024 15:19   MR LUMBAR SPINE WO CONTRAST Result Date: 06/08/2024 CLINICAL DATA:  Spine fracture, lumbar, traumatic EXAM: MRI LUMBAR SPINE WITHOUT CONTRAST TECHNIQUE: Multiplanar, multisequence MR imaging of the lumbar spine was performed. No intravenous contrast was administered. COMPARISON:  CT of the lumbar spine dated June 08, 2024. FINDINGS: Segmentation:  Transitional anatomy with sacralization of L5. Alignment:  Normal.  Vertebrae: There is a chronic downward bowing deformity of the superior endplate of L4. Conus medullaris and cauda equina: Conus extends to the L1 level. Conus and cauda equina appear normal. Paraspinal and other soft tissues: There are small simple cysts within the kidneys, which do not require follow-up. There is no paraspinous hematoma evident. Disc levels: L1-2: Broad-based disc bulging and endplate ridging, which is eccentric to the left, causing mild-to-moderate left sided spinal canal and left lateral recess stenosis. There is mild right lateral recess stenosis. There is questionable impingement of the left L2 nerve in the lateral recess. L2-3: Status post posterior decompression. There is broad-based disc bulging and moderate facet arthrosis, causing moderate residual central spinal canal stenosis and mild-to-moderate bilateral lateral recess stenosis. L3-4: Broad-based disc bulging without significant spinal canal or neural foraminal stenosis. L4-5: Bilateral facet arthrosis. No significant spinal canal or neural foraminal stenosis. L5-S1: The disc spaces preserved and the spinal canal and neural foramina are patent. IMPRESSION: 1. Chronic degenerative disc disease at L1-2 with mild-to-moderate left-sided spinal canal and lateral recess stenosis, with questionable impingement of the left L2 nerve in the lateral recess. 2. Chronic degenerative disc disease at L2-3, with mild-to-moderate bilateral lateral recess stenosis. Electronically Signed   By: Evalene Coho M.D.   On: 06/08/2024 15:16   DG Elbow 2 Views Left Result Date: 06/08/2024 CLINICAL DATA:  [id number] Pain [id number].  Fall. EXAM: LEFT ELBOW - 2 VIEW COMPARISON:  None Available. FINDINGS: No acute fracture or dislocation. No aggressive osseous lesion. Mild-to-moderate degenerative changes of elbow joint. Olecranon enthesophyte noted. No radiopaque foreign bodies. Soft tissues are within normal limits. IMPRESSION: No acute osseous abnormality of  the left elbow joint. Electronically Signed   By: Ree Molt M.D.   On: 06/08/2024 09:30   DG Knee Right Port Result Date: 06/08/2024 CLINICAL DATA:  Pain.  Fall. EXAM: PORTABLE RIGHT KNEE - 1-2 VIEW COMPARISON:  02/10/2023. FINDINGS: There is diffuse osteopenia of the visualized osseous structures. No acute fracture or dislocation. No aggressive osseous lesion. Redemonstration of right total knee arthroplasty with patellar resurfacing. The hardware is intact. No periprosthetic fracture or lucency. No interval change in alignment, when compared to the available recent prior examination. No knee effusion or focal soft tissue swelling. No radiopaque foreign bodies. IMPRESSION: No acute osseous abnormality of the right knee. Electronically Signed   By: Ree Molt M.D.   On: 06/08/2024 09:29   DG Shoulder Right Port Result Date: 06/08/2024 CLINICAL DATA:  [id number] Pain 144615.  Fall. EXAM: RIGHT SHOULDER - 1 VIEW COMPARISON:  11/26/2023. FINDINGS: No acute fracture or dislocation. No aggressive osseous lesion. Glenohumeral and acromioclavicular joints are normal in alignment and exhibit moderate degenerative changes. No soft tissue swelling. No radiopaque foreign bodies. IMPRESSION: *No acute osseous abnormality of the right shoulder. Electronically Signed   By: Ree Molt M.D.   On: 06/08/2024 09:29   CT T-SPINE NO CHARGE Addendum Date: 06/08/2024 ADDENDUM REPORT: 06/08/2024 08:07 ADDENDUM: Correction, Impression #1 should read:  ACUTE CHANCE type posttraumatic fracture. Electronically Signed   By: VEAR Hurst M.D.   On: 06/08/2024 08:07   Addendum Date: 06/08/2024 ADDENDUM REPORT: 06/08/2024 07:54 ADDENDUM: Study discussed by telephone with Dr. ALM  GLICK on 06/08/2024 at 0733 hours. Electronically Signed   By: VEAR Hurst M.D.   On: 06/08/2024 07:54   Result Date: 06/08/2024 CLINICAL DATA:  68 year old female status post fall at home. Left femur fracture. Pain. EXAM: CT THORACIC SPINE WITHOUT  CONTRAST TECHNIQUE: Multiplanar CT images of the thoracic spine were reconstructed from contemporary CT of the Chest. RADIATION DOSE REDUCTION: This exam was performed according to the departmental dose-optimization program which includes automated exposure control, adjustment of the mA and/or kV according to patient size and/or use of iterative reconstruction technique. CT cervical spine, CONTRAST:  None COMPARISON:  CT Chest, Abdomen, and Pelvis today are reported separately. Chest CT 11/26/2023. FINDINGS: Limited cervical spine imaging: Cervical spine reported separately. Cervicothoracic junction alignment is within normal limits. Thoracic spine segmentation:  Normal, hypoplastic ribs at T12. Alignment: Progressed kyphosis at T12 secondary to acute fracture deformity there. Stable straightening of thoracic kyphosis elsewhere. Mild underlying chronic dextroconvex thoracic scoliosis. Vertebrae: Hyperostosis related thoracic interbody ankylosis, from T3 through T4 via posterior element ankylosis, from T4 through T9 via anterior flowing endplate osteophytes, and from T11 through the thoracolumbar junction via posterior element ankylosis. Osteopenia. Superimposed acute Chance type fracture through the T11-T12 ankylosed level. Horizontal, mildly oblique fracture lucency traversing the chronically ankylosed posterior elements there including the T11 spinous process (series 1, image 33 and series 2, images 43 and 44). The T11 pedicles and body appear stable and intact. Acute on chronic T12 superior endplate compression fracture. Roughly 25% acute loss of T12 vertebral body height since January. Mild retropulsion of the posterosuperior endplate is new. The bilateral T12 posterior elements are fractured but nondisplaced. Other thoracic levels appears stable and intact. Lumbar spine reported separately. Paraspinal and other soft tissues: Mild T12 paravertebral soft tissue contusion or hematoma. Chest and abdomen reported  separately. Disc levels: T12 posterosuperior endplate retropulsion is superimposed on bulky chronic posterior element hypertrophy and ligament flavum calcification/ossification. Combined spinal stenosis is moderate, 40% (series 1, image 33). Thoracic spinal ankylosis above that level. IMPRESSION: 1. Acute on Chance type posttraumatic fracture of the ankylosed spine at T11-T12. Nondisplaced posterior element fractures. Associated acute on chronic T12 vertebral body compression (25%) with mild retropulsion of bone contributing to moderate multifactorial spinal stenosis there (in part degenerative). Recommend Spine Surgery consultation. 2. Underlying Thoracic hyperostosis related ankylosis from T3 through the thoracolumbar junction. 3. Lumbar spine, Chest, abdomen, and pelvis reported separately. Electronically Signed: By: VEAR Hurst M.D. On: 06/08/2024 07:29   CT L-SPINE NO CHARGE Result Date: 06/08/2024 CLINICAL DATA:  68 year old female status post fall at home. Left femur fracture. Pain. EXAM: CT LUMBAR SPINE WITHOUT CONTRAST TECHNIQUE: Technique: Multiplanar CT images of the lumbar spine were reconstructed from contemporary CT of the Abdomen and Pelvis. RADIATION DOSE REDUCTION: This exam was performed according to the departmental dose-optimization program which includes automated exposure control, adjustment of the mA and/or kV according to patient size and/or use of iterative reconstruction technique. CONTRAST:  None COMPARISON:  CT Chest, Abdomen, and Pelvis today reported separately. CT Abdomen and Pelvis 11/26/2023. FINDINGS: Segmentation: Transitional anatomy with sacralized L5 level. Correlation with radiographs is recommended prior to any operative intervention. This is concordant with the thoracic numbering today. Alignment: Stable since January lumbar lordosis, mild dextroconvex lumbar scoliosis. Vertebrae: T11-T12 fracture through the ankylosed spine is reported separately today. Superimposed T12-L1  ankylosis via the posterior elements. No definite L1-L2 or L2-L3 ankylosis at this time. But L3 through sacral ankylosis via the posterior elements. Lumbar vertebrae appear stable.  Mild chronic L4 superior endplate compression appears unchanged. Sacrum is detailed separately today. Paraspinal and other soft tissues: Abdomen and pelvis are reported separately. Lumbar paraspinal soft tissues remain normal. Disc levels: Stable lumbar spine degeneration from the January CT Abdomen and Pelvis. IMPRESSION: 1. Transitional anatomy with sacralized L5 level. T11-T12 fracture through the ankylosed spine is reported separately today. 2. No acute traumatic injury identified in the Lumbar Spine. Abnormal CT Abdomen and Pelvis reported separately. Electronically Signed   By: VEAR Hurst M.D.   On: 06/08/2024 07:37   CT CHEST ABDOMEN PELVIS WO CONTRAST Result Date: 06/08/2024 CLINICAL DATA:  68 year old female status post fall at home. Left femur fracture. Pain. EXAM: CT CHEST, ABDOMEN AND PELVIS WITHOUT CONTRAST TECHNIQUE: Multidetector CT imaging of the chest, abdomen and pelvis was performed following the standard protocol without IV contrast. RADIATION DOSE REDUCTION: This exam was performed according to the departmental dose-optimization program which includes automated exposure control, adjustment of the mA and/or kV according to patient size and/or use of iterative reconstruction technique. COMPARISON:  CT thoracic and lumbar spine today reported separately. CT Chest, Abdomen, and Pelvis 11/26/2023. FINDINGS: CT CHEST FINDINGS Cardiovascular: Vascular patency is not evaluated in the absence of IV contrast. Stable cardiac size. No pericardial effusion. Mild for age Calcified aortic atherosclerosis. Mediastinum/Nodes: Small to moderate gastric hiatal hernia with superimposed postoperative changes to the stomach. Stable since January. No evidence of mediastinal hematoma, mass, lymphadenopathy in the absence of IV contrast.  Lungs/Pleura: Mildly lower lung volumes. Major airways remain patent. Symmetric bilateral lower lobe peribronchial and dependent opacity most resembles atelectasis. No pneumothorax. No pleural effusion. No consolidation. No convincing pulmonary contusion. Stable 3 4 mm right lower lobe lung nodule series 5, image 79 (no follow-up imaging recommended). Musculoskeletal: Abnormal thoracic spine is reported separately today. Osteopenia. Occasional chronic anterior rib fractures on the right. Visible shoulder osseous structures, sternum appear intact. No acute rib fracture identified. CT ABDOMEN PELVIS FINDINGS Hepatobiliary: Chronic cholecystectomy. Negative noncontrast liver. No perihepatic fluid is identified. Pancreas: Negative noncontrast pancreas. Spleen: Negative noncontrast spleen. No perisplenic fluid is identified. Adrenals/Urinary Tract: Noncontrast adrenal glands and kidneys appears stable and intact. No hydronephrosis or hydroureter. Mildly distended, unremarkable urinary bladder. Chronic pelvic phleboliths. Stomach/Bowel: Mild mass effect on the rectum from presacral pelvic hematoma, detailed below. Otherwise the rectum and distal large bowel appear negative, retained stool. Redundant transverse colon, splenic flexure. Mildly redundant right colon, cecum on a lax mesentery near the midline. Appendix not identified. Chronic anastomosis in the right abdomen appears to be small bowel related. Small volume of oral contrast in bowel loops in that area. No dilated small bowel. Chronic postoperative changes throughout the greater curve of the stomach. The stomach is decompressed. Duodenum is decompressed. No pneumoperitoneum or free fluid identified. Vascular/Lymphatic: Vascular patency is not evaluated in the absence of IV contrast. Aortoiliac calcified atherosclerosis. Normal caliber abdominal aorta. No lymphadenopathy identified. Reproductive: Chronically diminutive or absent. Other: Deep pelvic, presacral  hematoma which is described below. No superimposed pelvis free fluid. No acute superficial soft tissue injury identified, mild left lower quadrant abdominal wall contusion was present in January. Musculoskeletal: Lumbar spine detailed separately. Sacrum, SI joints, pelvis, and proximal right femur appears stable and intact. Highly comminuted proximal left femur with displaced and angulated comminution fragments beginning at the intertrochanteric segment again noted. Associated left thigh intramuscular hematoma (series 3, image 121). Additionally, there is a moderate volume of lobulated and heterogeneous presacral hematoma in the deep pelvis, which is asymmetric to the  right (series 3, images 102 and 107 encompassing 30 x 64 x 55 mm (AP by transverse by CC), volume estimated at 50 mL. IMPRESSION: 1. Abnormal Thoracic spine CT, lumbar CT reported separately. 2. Presacral hematoma eccentric to the right in the deep pelvis, estimated 50 mL volume. Sacral osteopenia. No sacral or pelvic fracture is identified, but this finding is suspicious for occult sacral fracture. 3. Highly comminuted, displaced and angulated proximal Left femur fracture. Associated left thigh intramuscular hematoma. 4. No other acute traumatic injury identified in the noncontrast chest, abdomen, or pelvis. 5.  Aortic Atherosclerosis (ICD10-I70.0). Electronically Signed   By: VEAR Hurst M.D.   On: 06/08/2024 07:13   CT HEAD WO CONTRAST Result Date: 06/08/2024 CLINICAL DATA:  68 year old female status post fall at home. Left femur fracture. Pain. EXAM: CT HEAD WITHOUT CONTRAST TECHNIQUE: Contiguous axial images were obtained from the head CT 11/26/2023. Base of the skull through the vertex without intravenous contrast. RADIATION DOSE REDUCTION: This exam was performed according to the departmental dose-optimization program which includes automated exposure control, adjustment of the mA and/or kV according to patient size and/or use of iterative  reconstruction technique. COMPARISON:  None Available. FINDINGS: Brain: Normal cerebral volume. No midline shift, ventriculomegaly, mass effect, evidence of mass lesion, intracranial hemorrhage or evidence of cortically based acute infarction. Patchy and confluent periatrial white matter hypodensity is asymmetric, stable. Gray-white differentiation stable and otherwise within normal limits. Vascular: No suspicious intracranial vascular hyperdensity. Calcified atherosclerosis at the skull base. Skull: Hyperostosis of the calvarium and osteopenia again noted. Chronic bilateral nasal bone fractures appear stable. No acute osseous abnormality identified. Sinuses/Orbits: Regressed left maxillary retention cysts since January. Other Visualized paranasal sinuses and mastoids are stable and well aerated. Other: No acute orbit or scalp soft tissue injury identified. IMPRESSION: 1. No acute traumatic injury identified. 2. Stable non contrast CT appearance of chronic cerebral white matter disease. 3. Chronic nasal bone fractures. Electronically Signed   By: VEAR Hurst M.D.   On: 06/08/2024 06:57   CT Cervical Spine Wo Contrast Result Date: 06/08/2024 CLINICAL DATA:  68 year old female status post fall at home. Left femur fracture. Pain. EXAM: CT CERVICAL SPINE WITHOUT CONTRAST TECHNIQUE: Multidetector CT imaging of the cervical spine was performed without intravenous contrast. Multiplanar CT image reconstructions were also generated. RADIATION DOSE REDUCTION: This exam was performed according to the departmental dose-optimization program which includes automated exposure control, adjustment of the mA and/or kV according to patient size and/or use of iterative reconstruction technique. COMPARISON:  Head CT today.  Cervical spine CT 11/26/2023. FINDINGS: Alignment: Stable lordosis. Cervicothoracic junction alignment is within normal limits. Bilateral posterior element alignment is within normal limits. Skull base and vertebrae:  Chronic osteopenia. Visualized skull base is intact. No atlanto-occipital dissociation. C1 and C2 appear intact and aligned. No acute osseous abnormality identified. Soft tissues and spinal canal: No prevertebral fluid or swelling. No visible canal hematoma. Negative for age visible noncontrast neck soft tissues. Disc levels: Chronic right side facet arthropathy in the mid cervical spine. Bulky chronic endplate spurring C5 through C7. Associated bulky chronic leftward C6-C7 disc and endplate degeneration. But otherwise capacious CT appearance of the spinal canal. Upper chest: Osteopenia. Visible upper thoracic levels appear intact. Negative lung apices. IMPRESSION: 1. No acute traumatic injury identified in the cervical spine. 2. Osteopenia.  Chronic cervical spine degeneration. Electronically Signed   By: VEAR Hurst M.D.   On: 06/08/2024 06:21   DG FEMUR MIN 2 VIEWS LEFT Result Date: 06/08/2024 CLINICAL  DATA:  68 year old female status post fall at home.  Pain. EXAM: LEFT FEMUR 2 VIEWS COMPARISON:  None Available. FINDINGS: Four views of the left femur 0542 hours. Long segment spiral and comminuted left femur fracture beginning at the intertrochanteric segment and continuing distally a distance of about 15 cm. Large medial butterfly fragment displaced medially more than 1 full shaft width. Left femoral head appears to remain normally located. Grossly intact visible left hemipelvis. Distal left femur, alignment at the left knee appear intact. Tricompartmental left knee joint degeneration. Grossly intact visible proximal tibia and fibula. IMPRESSION: Long segment spiral and comminuted left femur fracture from the intertrochanteric segment continuing 15 cm distally into the midshaft. Large and medially displaced comminution fragment. Electronically Signed   By: VEAR Hurst M.D.   On: 06/08/2024 05:53    Assessment/Plan: 68 year old woman with a T11-12 Chance fracture status post T11-L1 posterior percutaneous  instrumentation - Continue TLSO brace when out of bed - Continue PT/OT   Kimberly [name] Ned 06/09/2024, 10:46 AM

## 2024-06-09 NOTE — Evaluation (Signed)
 Physical Therapy Evaluation Patient Details Name: Kimberly [name] MRN: [id number] DOB: 24-Jun-1956 Today's Date: 06/09/2024  History of Present Illness  68 y.o. female who presented 06/08/24 via EMS after a fall. Cervical MRI negative; T11/12 multi column Chance type fracture (7/18 ORIF T11-L1); L femur fx (7/18 ORIF) PMH significant for CAD, CHF, COPD, Cirrhosis, Fibromyalgia, HTN, HLD, Hypothyroidism, OSA, Pulm HTN, Seizures, CVA.  Clinical Impression   Pt admitted secondary to problem above with deficits below. PTA patient lives with spouse in a one-level home with 1 step to enter. She used a cane to ambulate modified independent. Pt currently requires up to max assist for bed mobility and +2 min assist for sit to stand and bed to chair transfers. She was able to maintain her LLE TDWB precautions while she heel/toe/shimmied on her RLE from bed to chair (not yet able to clear Rt foot for ambulaiton). Anticipate patient will benefit from PT to address problems listed below. Will continue to follow acutely to maximize functional mobility, independence, and safety. Patient could benefit from post-acute therapies >3 hrs/day prior to discharge home.          If plan is discharge home, recommend the following: Two people to help with walking and/or transfers;A lot of help with bathing/dressing/bathroom;Assistance with cooking/housework;Assist for transportation;Help with stairs or ramp for entrance   Can travel by private vehicle        Equipment Recommendations None recommended by PT  Recommendations for Other Services  Rehab consult    Functional Status Assessment Patient has had a recent decline in their functional status and demonstrates the ability to make significant improvements in function in a reasonable and predictable amount of time.     Precautions / Restrictions Precautions Precautions: Back Precaution Booklet Issued: Yes (comment) Recall of Precautions/Restrictions: Impaired  (cues to recall back precautions at end of session) Precaution/Restrictions Comments: brace donned seated; no OOB without brace; may remove to shower Required Braces or Orthoses: Spinal Brace Spinal Brace: Thoracolumbosacral orthotic;Applied in sitting position Restrictions Weight Bearing Restrictions Per Provider Order: Yes LLE Weight Bearing Per Provider Order: Touchdown weight bearing      Mobility  Bed Mobility Overal bed mobility: Needs Assistance Bed Mobility: Rolling, Sidelying to Sit Rolling: +2 for physical assistance, +2 for safety/equipment, Max assist Sidelying to sit: Min assist, Used rails       General bed mobility comments: heavy assist for rolling right with cues for hand placement, LE positioning. Once sidelying, min A to initiate rise progressing to CGA; intermittent assist to scoot L hip toward EOB    Transfers Overall transfer level: Needs assistance Equipment used: Rolling walker (2 wheels) Transfers: Sit to/from Stand, Bed to chair/wheelchair/BSC Sit to Stand: Min assist, +2 safety/equipment (ICU bed) Stand pivot transfers: Min assist, +2 safety/equipment         General transfer comment: Cues initially for hand placement with pt needing min A for rise up from edge of ICU bed; pt then with good maintenance of LLE precautions maintaining NWB throughout and min A for balance white pivoting heel/toe toward R to chair with use of RW    Ambulation/Gait               General Gait Details: unable; used heel/toe/shimmy to advance RLE from bed to chair  Stairs            Wheelchair Mobility     Tilt Bed    Modified Rankin (Stroke Patients Only)       Balance  Overall balance assessment: Needs assistance Sitting-balance support: No upper extremity supported Sitting balance-Leahy Scale: Good Sitting balance - Comments: statically   Standing balance support: Bilateral upper extremity supported Standing balance-Leahy Scale:  Poor Standing balance comment: reliant on device and external assist intermittently                             Pertinent Vitals/Pain Pain Assessment Pain Assessment: Faces Faces Pain Scale: Hurts even more Pain Location: back, LLE Pain Descriptors / Indicators: Aching, Sore Pain Intervention(s): Limited activity within patient's tolerance, Monitored during session, Premedicated before session, Repositioned    Home Living Family/patient expects to be discharged to:: Private residence Living Arrangements: Spouse/significant other Available Help at Discharge: Family;Available 24 hours/day Type of Home: House Home Access: Stairs to enter Entrance Stairs-Rails: None Entrance Stairs-Number of Steps: 1   Home Layout: One level Home Equipment: Agricultural consultant (2 wheels);Cane - single point (hemi walker)      Prior Function Prior Level of Function : Independent/Modified Independent;Needs assist             Mobility Comments: ambulates with SPC ADLs Comments: does not drive, recent assist with medication management due to difficulty reading labels     Extremity/Trunk Assessment   Upper Extremity Assessment Upper Extremity Assessment: Defer to OT evaluation    Lower Extremity Assessment Lower Extremity Assessment: Generalized weakness;LLE deficits/detail LLE Deficits / Details: +edema, bruising; AAROM limited in supine; seated able to bend hip and knee to 90 with encouragement LLE Sensation: history of peripheral neuropathy    Cervical / Trunk Assessment Cervical / Trunk Assessment: Back Surgery  Communication   Communication Communication: No apparent difficulties    Cognition Arousal: Alert Behavior During Therapy: WFL for tasks assessed/performed                             Following commands: Intact       Cueing Cueing Techniques: Verbal cues     General Comments General comments (skin integrity, edema, etc.): On 2 L supplemental O2  on arrival; maintaining 100% on RA supine; SpO2 as low as 80 on RA with coming to EOB; returned to 2.5L supplemental O2 with VSS    Exercises     Assessment/Plan    PT Assessment Patient needs continued PT services  PT Problem List Decreased strength;Decreased range of motion;Decreased activity tolerance;Decreased balance;Decreased mobility;Decreased knowledge of use of DME;Decreased knowledge of precautions;Impaired sensation;Pain       PT Treatment Interventions DME instruction;Gait training;Stair training;Functional mobility training;Therapeutic activities;Therapeutic exercise;Balance training;Patient/family education    PT Goals (Current goals can be found in the Care Plan section)  Acute Rehab PT Goals Patient Stated Goal: avoid going to SNF PT Goal Formulation: With patient Time For Goal Achievement: 06/23/24 Potential to Achieve Goals: Good    Frequency Min 3X/week     Co-evaluation               AM-PAC PT 6 Clicks Mobility  Outcome Measure Help needed turning from your back to your side while in a flat bed without using bedrails?: Total Help needed moving from lying on your back to sitting on the side of a flat bed without using bedrails?: A Lot Help needed moving to and from a bed to a chair (including a wheelchair)?: Total Help needed standing up from a chair using your arms (e.g., wheelchair or bedside chair)?: Total Help needed to  walk in hospital room?: Total Help needed climbing 3-5 steps with a railing? : Total 6 Click Score: 7    End of Session Equipment Utilized During Treatment: Gait belt;Oxygen  Activity Tolerance: Patient limited by pain Patient left: in chair;with call bell/phone within reach;with chair alarm set Nurse Communication: Mobility status;Weight bearing status PT Visit Diagnosis: Other abnormalities of gait and mobility (R26.89);History of falling (Z91.81);Muscle weakness (generalized) (M62.81)    Time: 9058-8977 PT Time Calculation  (min) (ACUTE ONLY): 41 min   Charges:   PT Evaluation $PT Eval Moderate Complexity: 1 Mod   PT General Charges $$ ACUTE PT VISIT: 1 Visit          Macario RAMAN, PT Acute Rehabilitation Services  Office [phone]   Macario SHAUNNA Soja 06/09/2024, 12:15 PM

## 2024-06-09 NOTE — Plan of Care (Addendum)
 Doing well, progressing toward goals. Has transfer orders to lower level of care  2200- c/o a lot of pain with movement. Reinforced that every day the pain should lessen some but rehab will be challenging. Encouraged that at least for the first few days postop she stay ahead of the pain by taking her hydrocodone  at regular intervals. Also utilizing distraction, ice packs (especially for the itchiness she c/o at incision site), and repositioning.

## 2024-06-09 NOTE — Evaluation (Signed)
 Occupational Therapy Evaluation Patient Details Name: Kimberly [name] MRN: [id number] DOB: 06-12-1956 Today's Date: 06/09/2024   History of Present Illness   68 y.o. female who presented 06/08/24 via EMS after a fall. Cervical MRI negative; T11/12 multi column Chance type fracture (7/18 ORIF T11-L1); L femur fx (7/18 ORIF) PMH significant for CAD, CHF, COPD, Cirrhosis, Fibromyalgia, HTN, HLD, Hypothyroidism, OSA, Pulm HTN, Seizures, CVA.     Clinical Impressions PTA, pt lived with her husband and reports recent difficulty with community mobility due to LE pain and cramping; recently started on gabapentin  and has been active with OP PT (also reports OT related goals but denied going to OP OT). Pt reports recent assist with medication management due to progression of her macular degeneration. Upon eval, pt with decreased knowledge of precautions, pain, decreased strength, balance, safety. Pt needing min A +2 for functional transfers and with good effort to maintain TWB precautions throughout. Pt currently requires set-up to max A for BADL. Due to significant change in functional status, pt motivation, and good support reported, recommending intensive multidisciplinary rehabilitation >3 hours/day to optimize safety and independence in ADL.       If plan is discharge home, recommend the following:   A lot of help with bathing/dressing/bathroom;A lot of help with walking and/or transfers;Assistance with cooking/housework;Assist for transportation;Help with stairs or ramp for entrance     Functional Status Assessment   Patient has had a recent decline in their functional status and demonstrates the ability to make significant improvements in function in a reasonable and predictable amount of time.     Equipment Recommendations   Other (comment) (defer)     Recommendations for Other Services   Rehab consult     Precautions/Restrictions   Precautions Precautions: Back Precaution  Booklet Issued: Yes (comment) Recall of Precautions/Restrictions: Impaired (cues to recall back precautions at end of session) Precaution/Restrictions Comments: brace donned seated; no OOB without brace; may remove to shower Required Braces or Orthoses: Spinal Brace Spinal Brace: Thoracolumbosacral orthotic;Applied in sitting position Restrictions Weight Bearing Restrictions Per Provider Order: Yes LLE Weight Bearing Per Provider Order: Touchdown weight bearing     Mobility Bed Mobility Overal bed mobility: Needs Assistance Bed Mobility: Rolling, Sidelying to Sit Rolling: +2 for physical assistance, +2 for safety/equipment, Max assist Sidelying to sit: Min assist       General bed mobility comments: heavy assist for rolling with cues for hand placement, LE positioning. Once sidelying, min A to initiate rise progressing to CGA; intermittent assist to scoot L hip toward EOB    Transfers Overall transfer level: Needs assistance Equipment used: Rolling walker (2 wheels) Transfers: Sit to/from Stand, Bed to chair/wheelchair/BSC Sit to Stand: Min assist, +2 safety/equipment (ICU bed) Stand pivot transfers: Min assist, +2 safety/equipment         General transfer comment: Cues initially for hand placement with pt needing min A for rise up from edge of ICU bed; pt then with good maintenance of LLE precautions maintaining NWB throughout and min A for balance white pivoting heel/toe toward R to chair with use of RW      Balance Overall balance assessment: Needs assistance Sitting-balance support: No upper extremity supported Sitting balance-Leahy Scale: Good Sitting balance - Comments: statically   Standing balance support: Bilateral upper extremity supported Standing balance-Leahy Scale: Poor Standing balance comment: reliant on device and external assist intermittently  ADL either performed or assessed with clinical judgement   ADL Overall  ADL's : Needs assistance/impaired Eating/Feeding: Set up;Sitting   Grooming: Set up;Sitting   Upper Body Bathing: Set up;Sitting   Lower Body Bathing: Maximal assistance;Sit to/from stand   Upper Body Dressing : Maximal assistance;Sitting Upper Body Dressing Details (indicate cue type and reason): brace application Lower Body Dressing: Maximal assistance;Sit to/from stand   Toilet Transfer: Minimal assistance;+2 for physical assistance;+2 for safety/equipment;Stand-pivot;Rolling walker (2 wheels) Toilet Transfer Details (indicate cue type and reason): simulated to chair Toileting- Clothing Manipulation and Hygiene: Moderate assistance;Sit to/from stand       Functional mobility during ADLs: Minimal assistance;+2 for safety/equipment;+2 for physical assistance;Rolling walker (2 wheels)       Vision Baseline Vision/History: 6 Macular Degeneration;1 Wears glasses Ability to See in Adequate Light: 1 Impaired Patient Visual Report: No change from baseline (pt reports use of bifocals, uses magnifying glass to read; recent asssist with medication management due to inability to read pill bottle labels) Additional Comments: no change reported from baseline     Perception Perception: Not tested       Praxis Praxis: Not tested       Pertinent Vitals/Pain Pain Assessment Pain Assessment: Faces Faces Pain Scale: Hurts even more Pain Location: back, LLE Pain Descriptors / Indicators: Aching, Sore Pain Intervention(s): Limited activity within patient's tolerance, Monitored during session     Extremity/Trunk Assessment Upper Extremity Assessment Upper Extremity Assessment: Generalized weakness;Left hand dominant   Lower Extremity Assessment Lower Extremity Assessment: Defer to PT evaluation   Cervical / Trunk Assessment Cervical / Trunk Assessment: Back Surgery   Communication Communication Communication: No apparent difficulties   Cognition Arousal: Alert Behavior During  Therapy: WFL for tasks assessed/performed Cognition: Cognition impaired   Orientation impairments: Time (day (pt reported Wednesday)) Awareness: Intellectual awareness intact, Online awareness intact Memory impairment (select all impairments): Short-term memory Attention impairment (select first level of impairment): Selective attention Executive functioning impairment (select all impairments): Sequencing (cues intermittently)                   Following commands: Intact       Cueing  General Comments   Cueing Techniques: Verbal cues  On 2 L supplemental O2 on arrival; maintaining 100% on RA supine; SpO2 as low as 80 on RA with coming to EOB; returned to 2.5L supplemental O2 with VSS   Exercises     Shoulder Instructions      Home Living Family/patient expects to be discharged to:: Private residence Living Arrangements: Spouse/significant other Available Help at Discharge: Family;Available 24 hours/day Type of Home: House Home Access: Stairs to enter [employer] of Steps: 1 Entrance Stairs-Rails: None Home Layout: One level     Bathroom Shower/Tub: Chief Strategy Officer: Standard     Home Equipment: Agricultural consultant (2 wheels);Cane - single point (hemi walker)          Prior Functioning/Environment Prior Level of Function : Independent/Modified Independent;Needs assist             Mobility Comments: ambulates with SPC ADLs Comments: does not drive, recent assist with medication management due to difficulty reading labels    OT Problem List: Decreased strength;Decreased activity tolerance;Decreased range of motion;Impaired balance (sitting and/or standing);Decreased safety awareness;Decreased knowledge of use of DME or AE;Decreased knowledge of precautions;Pain   OT Treatment/Interventions: Self-care/ADL training;Therapeutic exercise;DME and/or AE instruction;Therapeutic activities;Patient/family education;Balance training       OT Goals(Current goals can be found in  the care plan section)   Acute Rehab OT Goals Patient Stated Goal: go to rehab OT Goal Formulation: With patient Time For Goal Achievement: 06/23/24 Potential to Achieve Goals: Good   OT Frequency:  Min 2X/week    Co-evaluation              AM-PAC OT 6 Clicks Daily Activity     Outcome Measure Help from another person eating meals?: A Little Help from another person taking care of personal grooming?: A Little Help from another person toileting, which includes using toliet, bedpan, or urinal?: A Lot Help from another person bathing (including washing, rinsing, drying)?: A Lot Help from another person to put on and taking off regular upper body clothing?: A Lot Help from another person to put on and taking off regular lower body clothing?: A Lot 6 Click Score: 14   End of Session Equipment Utilized During Treatment: Gait belt;Rolling walker (2 wheels);Back brace Nurse Communication: Mobility status  Activity Tolerance: Patient tolerated treatment well Patient left: in chair;with call bell/phone within reach;with chair alarm set  OT Visit Diagnosis: Unsteadiness on feet (R26.81);Muscle weakness (generalized) (M62.81);Other symptoms and signs involving cognitive function;History of falling (Z91.81);Pain Pain - part of body:  (back>LLE)                Time: 9058-8977 OT Time Calculation (min): 41 min Charges:  OT General Charges $OT Visit: 1 Visit OT Evaluation $OT Eval Moderate Complexity: 1 Mod OT Treatments $Self Care/Home Management : 8-22 mins  Elma JONETTA Lebron FREDERICK, OTR/L [facility] Acute Rehabilitation Office: [phone]   Elma JONETTA Lebron 06/09/2024, 10:48 AM

## 2024-06-10 LAB — GLUCOSE, CAPILLARY: Glucose-Capillary: 104 mg/dL — ABNORMAL HIGH (ref 70–99)

## 2024-06-10 MED ORDER — CHOLECALCIFEROL 10 MCG/ML (400 UNIT/ML) PO LIQD
5000.0000 [IU] | Freq: Every day | ORAL | Status: DC
  Filled 2024-06-10: qty 12.5

## 2024-06-10 MED ORDER — CALCIUM CARBONATE ANTACID 500 MG PO CHEW
1.0000 | CHEWABLE_TABLET | Freq: Three times a day (TID) | ORAL | Status: DC | PRN
  Administered 2024-06-10 – 2024-06-12 (×4): 200 mg via ORAL
  Filled 2024-06-10 (×5): qty 1

## 2024-06-10 MED ORDER — VITAMIN D 25 MCG (1000 UNIT) PO TABS
5000.0000 [IU] | ORAL_TABLET | Freq: Every day | ORAL | Status: DC
  Administered 2024-06-10 – 2024-06-18 (×9): 5000 [IU] via ORAL
  Filled 2024-06-10 (×9): qty 5

## 2024-06-10 MED ORDER — DIPHENHYDRAMINE HCL 25 MG PO CAPS
25.0000 mg | ORAL_CAPSULE | Freq: Four times a day (QID) | ORAL | Status: DC | PRN
  Administered 2024-06-10 – 2024-06-18 (×7): 25 mg via ORAL
  Filled 2024-06-10 (×7): qty 1

## 2024-06-10 NOTE — Progress Notes (Signed)
 Trauma/Critical Care Follow Up Note  Subjective:    Overnight Issues:  c/o back pain and LLE pain  Objective:  Vital signs for last 24 hours: Temp:  [97.5 F (36.4 C)-98.6 F (37 C)] 98.3 F (36.8 C) (07/20 0800) Pulse Rate:  [63-77] 71 (07/20 0800) Resp:  [10-20] 17 (07/20 0800) BP: (92-135)/(55-90) 135/61 (07/20 0800) SpO2:  [94 %-100 %] 97 % (07/20 0800)  Intake/Output from previous day: 07/19 0701 - 07/20 0700 In: 609 [P.O.:500; I.V.:9; IV Piggyback:100] Out: 2450 [Urine:2450]  Intake/Output this shift: Total I/O In: 250 [P.O.:250] Out: -   Physical Exam:  Gen: comfortable, no distress Neuro: follows commands, alert, communicative HEENT: PERRL Neck: supple CV: RRR Pulm: unlabored breathing on RA Abd: soft, NT  ,   Extr: wwp, no edema, L thigh dressing clean and dry, compartments soft  Results for orders placed or performed during the hospital encounter of 06/08/24 (from the past 24 hours)  Glucose, capillary     Status: Abnormal   Collection Time: 06/09/24 12:04 PM  Result Value Ref Range   Glucose-Capillary 221 (H) 70 - 99 mg/dL  Glucose, capillary     Status: Abnormal   Collection Time: 06/09/24  3:54 PM  Result Value Ref Range   Glucose-Capillary 135 (H) 70 - 99 mg/dL  Glucose, capillary     Status: Abnormal   Collection Time: 06/09/24  9:48 PM  Result Value Ref Range   Glucose-Capillary 144 (H) 70 - 99 mg/dL  Glucose, capillary     Status: Abnormal   Collection Time: 06/10/24  4:17 AM  Result Value Ref Range   Glucose-Capillary 104 (H) 70 - 99 mg/dL    Assessment & Plan: The plan of care was discussed with the bedside nurse for the day, who is in agreement with this plan and no additional concerns were raised.   Present on Admission:  Thoracic spine fracture (HCC)    LOS: 2 days   Additional comments:I reviewed the patient's new clinical lab test results.   and I reviewed the patients new imaging test results.    GLF 7/18 L femur fx - s/p  IMN ([facility])- TDWB LLE Presacral hematoma with possible occult sacral fx - Ortho consulted. Trend hgb- stable. Monitor to see if foley needed T11-T12 fx - s/p T11-L1 posterior percutaneous instrumentation Cathy). TLSO brace when out of bed Neck pain - CT neg for fx, MRI with degenerative disc disease at C6-7 with mild-to-moderate central spinal canal stenosis and mild left neural foraminal stenosis, no acute injury L elbow pain - xray neg R shoulder pain - xray neg R knee pain - xray neg  Hx Osteoporosis Hx CAD Hx CHF Hx COPD Hx Cirrhosis Hx Fibromyalgia Hx HTN - reports resolved since she had her duodenal switch Hx HLD -  reports resolved since she had her duodenal switch Hx Hypothyroidism Hx OSA -  reports no longer on CPAP since she had her duodenal switch Hx Pulm HTN Hx Seizures Hx CVA   Hx duodenal switch - avoid NSAIDs, multi. CBG monitoring given she reports episodes of hypoglycemia since surgery.  FEN - carb mod diet VTE - SCDs, LMWH when okay with NSGY ID - None currently.  Foley - foley out 7/19 Dispo - 4NP, therapies   Mitzie DELENA Freund MD FACS Trauma & General Surgery Please use [email or web address] to contact on call provider  06/10/2024  *Care during the described time interval was provided by me. I have reviewed this patient's available data, including  medical history, events of note, physical examination and test results as part of my evaluation.

## 2024-06-10 NOTE — Progress Notes (Signed)
 Subjective: Patient reports pain is better today  Objective: Vital signs in last 24 hours: Temp:  [97.5 F (36.4 C)-98.6 F (37 C)] 98.3 F (36.8 C) (07/20 0800) Pulse Rate:  [63-77] 64 (07/20 0900) Resp:  [9-20] 9 (07/20 0900) BP: (92-135)/(55-90) 123/75 (07/20 0900) SpO2:  [94 %-100 %] 100 % (07/20 0900)  Intake/Output from previous day: 07/19 0701 - 07/20 0700 In: 609 [P.O.:500; I.V.:9; IV Piggyback:100] Out: 2450 [Urine:2450] Intake/Output this shift: Total I/O In: 253 [P.O.:250; I.V.:3] Out: 600 [Urine:600]  Alert, oriented x 3 Following commands x 4, 4+/5 strength in right lower extremity, left lower extremity not tested Back incision dressings clean dry  Lab Results: Recent Labs    06/08/24 0529 06/08/24 1624 06/08/24 1932 06/09/24 0112  WBC 7.0  --   --  6.5  HGB 11.6*   < > 10.9* 11.0*  HCT 34.2*   < > 32.0* 32.3*  PLT 136*  --   --  85*   < > = values in this interval not displayed.   BMET Recent Labs    06/08/24 0529 06/08/24 1624 06/08/24 1932 06/08/24 2329  NA 138   < > 141 140  K 3.6   < > 3.8 3.8  CL 104  --   --  110  CO2 22  --   --  22  GLUCOSE 108*  --   --  162*  BUN 14  --   --  17  CREATININE 1.07*  --   --  1.01*  CALCIUM  8.5*  --   --  6.9*   < > = values in this interval not displayed.    Studies/Results: DG FEMUR PORT MIN 2 VIEWS LEFT Result Date: 06/09/2024 CLINICAL DATA:  Post op femur fracture EXAM: LEFT FEMUR PORTABLE 2 VIEWS COMPARISON:  06/08/2024 FINDINGS: Interval intramedullary rodding and screw fixation of the left femur for comminuted fracture involving the proximal left femur with decreased fracture displacement and angulation compared to prior. Gas in the soft tissues consistent with recent surgery IMPRESSION: Interval intramedullary rodding and screw fixation of proximal left femur fracture with decreased fracture displacement and angulation. Electronically Signed   By: Luke Bun M.D.   On: 06/09/2024 00:09   DG  THORACOLUMBAR SPINE Result Date: 06/08/2024 EXAM: XR Intraoperative Imaging, 2 Views TECHNIQUE: Fluoroscopy was provided by the radiology department for procedure. Radiologist was not present during examination. FLUOROSCOPY DOSE AND TYPE: Fluoroscopy time: 6.7 seconds Radiation Dose Index: 2.03 mGy COMPARISON: None available. CLINICAL HISTORY: [id number] Surgery, elective [id number]. Surgery: THORACIC ELEVEN-LUMBAR ONE PERCUTANEOUS PEDICLE SCREW (Spine Thoracic) ; RSTO: JR; Fluoro time: 6.7 secs ; mGy: 2.03 FINDINGS: Intraoperative fluoroscopic images during posterior fusion of the lower thoracic/upper lumbar spine (T11-L1). IMPRESSION: 1. Intraoperative fluoroscopic spot images, as above. NOTE: Intraoperative fluoroscopic spot images as above. Please refer to the intraoperative report for full details. Electronically signed by: Pinkie Pebbles MD 06/08/2024 08:55 PM EDT RP Workstation: [id number]   DG C-Arm 1-60 Min-No Report Result Date: 06/08/2024 Fluoroscopy was utilized by the requesting physician.  No radiographic interpretation.   DG O-ARM IMAGE ONLY/NO REPORT Result Date: 06/08/2024 There is no Radiologist interpretation  for this exam.  DG FEMUR MIN 2 VIEWS LEFT Result Date: 06/08/2024 CLINICAL DATA:  Elective surgery. EXAM: LEFT FEMUR 2 VIEWS COMPARISON:  Preoperative imaging FINDINGS: Thirteen fluoroscopic spot views of the femur are submitted from the operating room. Femoral intramedullary nail with trans trochanteric and distal locking screw fixation traversing proximal femur fracture. Fluoroscopy time  98.2 seconds. Dose 14.48 mGy. IMPRESSION: Intraoperative fluoroscopy during proximal femur fracture fixation. Electronically Signed   By: Andrea Gasman M.D.   On: 06/08/2024 19:49   DG C-Arm 1-60 Min-No Report Result Date: 06/08/2024 Fluoroscopy was utilized by the requesting physician.  No radiographic interpretation.   DG C-Arm 1-60 Min-No Report Result Date: 06/08/2024 Fluoroscopy was  utilized by the requesting physician.  No radiographic interpretation.   MR THORACIC SPINE WO CONTRAST Result Date: 06/08/2024 CLINICAL DATA:  Spine fracture, thoracic, traumatic EXAM: MRI THORACIC SPINE WITHOUT CONTRAST TECHNIQUE: Multiplanar, multisequence MR imaging of the thoracic spine was performed. No intravenous contrast was administered. COMPARISON:  CT of the thoracic spine dated June 08, 2024. FINDINGS: Alignment: Mild focal kyphosis at T11-12 secondary to a fracture of the T12 vertebral body and extension through the posterior elements of T11, which is more readily apparent on the previous CT. The other thoracic vertebrae are intact and normal in signal intensity. Vertebrae: Chance fracture of T12 with extension through the spinous process of T11. The T12 vertebral body has lost approximately 30% of its height anteriorly. There is bone marrow edema present within the superior vertebral body and there is increased T2 signal within the T11-12 disc space. There is no paraspinous or epidural hematoma demonstrated. The spinal canal and neural foramina are patent at T11 and T12. Cord:  Normal in morphology and signal intensity. Paraspinal and other soft tissues: There is probable dependent atelectasis within the right lower lobe and to lesser extent the left lower lobe. The paraspinous soft tissues are unremarkable, excepting for simple cysts within the right kidney. Disc levels: There is mild disc bulging and bilateral facet hypertrophy present at T10-11, which is causing mild-to-moderate central spinal canal stenosis, but no spinal cord impingement. There are also facet hypertrophic changes present on the right at T9-10, causing mild right-sided spinal canal stenosis. There is no spinal cord or nerve root impingement. The remainder of the thoracic disc space levels are unremarkable. IMPRESSION: 1. There is an unusual Chance type fracture involving the T12 vertebral body and the spinous process of T11,  with mild focal kyphosis, but no spinal cord or nerve root impingement. There is also no paraspinous or epidural hematoma evident. 2. Mild to moderate central spinal canal stenosis at T10-11 secondary did degenerative disc disease and facet arthrosis. Electronically Signed   By: Evalene Coho M.D.   On: 06/08/2024 15:31   MR CERVICAL SPINE WO CONTRAST Result Date: 06/08/2024 CLINICAL DATA:  Cervical radiculopathy, no red flags EXAM: MRI CERVICAL SPINE WITHOUT CONTRAST TECHNIQUE: Multiplanar, multisequence MR imaging of the cervical spine was performed. No intravenous contrast was administered. COMPARISON:  CT of the cervical spine dated June 08, 2024. FINDINGS: Alignment: Normal. Vertebrae: Intact. No osseous lesions or evidence of acute traumatic injury. There are anterior osteophytes at C5-6 and C6-7. Cord: Normal in morphology and signal intensity. Posterior Fossa, vertebral arteries, paraspinal tissues: Negative. Disc levels: There is posterior disc bulging and endplate ridging at C6-7, causing mild-to-moderate central spinal canal stenosis. There is also mild left neural foraminal stenosis, but no spinal cord or nerve root compression. IMPRESSION: 1. Chronic degenerative disc disease at C6-7 with mild-to-moderate central spinal canal stenosis and mild left neural foraminal stenosis. Electronically Signed   By: Evalene Coho M.D.   On: 06/08/2024 15:19   MR LUMBAR SPINE WO CONTRAST Result Date: 06/08/2024 CLINICAL DATA:  Spine fracture, lumbar, traumatic EXAM: MRI LUMBAR SPINE WITHOUT CONTRAST TECHNIQUE: Multiplanar, multisequence MR imaging of the lumbar  spine was performed. No intravenous contrast was administered. COMPARISON:  CT of the lumbar spine dated June 08, 2024. FINDINGS: Segmentation:  Transitional anatomy with sacralization of L5. Alignment:  Normal. Vertebrae: There is a chronic downward bowing deformity of the superior endplate of L4. Conus medullaris and cauda equina: Conus extends  to the L1 level. Conus and cauda equina appear normal. Paraspinal and other soft tissues: There are small simple cysts within the kidneys, which do not require follow-up. There is no paraspinous hematoma evident. Disc levels: L1-2: Broad-based disc bulging and endplate ridging, which is eccentric to the left, causing mild-to-moderate left sided spinal canal and left lateral recess stenosis. There is mild right lateral recess stenosis. There is questionable impingement of the left L2 nerve in the lateral recess. L2-3: Status post posterior decompression. There is broad-based disc bulging and moderate facet arthrosis, causing moderate residual central spinal canal stenosis and mild-to-moderate bilateral lateral recess stenosis. L3-4: Broad-based disc bulging without significant spinal canal or neural foraminal stenosis. L4-5: Bilateral facet arthrosis. No significant spinal canal or neural foraminal stenosis. L5-S1: The disc spaces preserved and the spinal canal and neural foramina are patent. IMPRESSION: 1. Chronic degenerative disc disease at L1-2 with mild-to-moderate left-sided spinal canal and lateral recess stenosis, with questionable impingement of the left L2 nerve in the lateral recess. 2. Chronic degenerative disc disease at L2-3, with mild-to-moderate bilateral lateral recess stenosis. Electronically Signed   By: Evalene Coho M.D.   On: 06/08/2024 15:16    Assessment/Plan: Status post T11-L1 posterior percutaneous instrumentation for Chance fracture - Continue TLSO when out of bed, continue PT/OT - Dressing removal on Monday - Awaiting floor bed   Kimberly [name] 06/10/2024, 10:07 AM

## 2024-06-10 NOTE — Plan of Care (Signed)
  Problem: Education: Goal: Knowledge of General Education information will improve Description: Including pain rating scale, medication(s)/side effects and non-pharmacologic comfort measures Outcome: Progressing   Problem: Clinical Measurements: Goal: Ability to maintain clinical measurements within normal limits will improve Outcome: Progressing Goal: Will remain free from infection Outcome: Progressing Goal: Respiratory complications will improve Outcome: Progressing Goal: Cardiovascular complication will be avoided Outcome: Progressing   Problem: Coping: Goal: Level of anxiety will decrease Outcome: Progressing   Problem: Elimination: Goal: Will not experience complications related to urinary retention Outcome: Progressing

## 2024-06-11 DIAGNOSIS — K592 Neurogenic bowel, not elsewhere classified: Secondary | ICD-10-CM

## 2024-06-11 DIAGNOSIS — S22082K Unstable burst fracture of T11-T12 vertebra, subsequent encounter for fracture with nonunion: Secondary | ICD-10-CM

## 2024-06-11 LAB — GLUCOSE, CAPILLARY
Glucose-Capillary: 98 mg/dL (ref 70–99)
Glucose-Capillary: 105 mg/dL — ABNORMAL HIGH (ref 70–99)
Glucose-Capillary: 84 mg/dL (ref 70–99)
Glucose-Capillary: 133 mg/dL — ABNORMAL HIGH (ref 70–99)
Glucose-Capillary: 151 mg/dL — ABNORMAL HIGH (ref 70–99)
Glucose-Capillary: 91 mg/dL (ref 70–99)

## 2024-06-11 MED ORDER — MORPHINE SULFATE (PF) 2 MG/ML IV SOLN: 1.0000 mg | INTRAVENOUS | Status: DC | PRN

## 2024-06-11 MED ORDER — POLYETHYLENE GLYCOL 3350 17 G PO PACK
17.0000 g | PACK | Freq: Every day | ORAL | Status: DC
  Administered 2024-06-11 – 2024-06-12 (×2): 17 g via ORAL
  Filled 2024-06-11 (×6): qty 1

## 2024-06-11 MED ORDER — ENOXAPARIN SODIUM 30 MG/0.3ML IJ SOSY
30.0000 mg | PREFILLED_SYRINGE | Freq: Two times a day (BID) | INTRAMUSCULAR | Status: DC
  Administered 2024-06-11 – 2024-06-18 (×15): 30 mg via SUBCUTANEOUS
  Filled 2024-06-11 (×14): qty 0.3

## 2024-06-11 MED ORDER — PANTOPRAZOLE SODIUM 40 MG PO TBEC
40.0000 mg | DELAYED_RELEASE_TABLET | Freq: Two times a day (BID) | ORAL | Status: DC
  Administered 2024-06-11 – 2024-06-18 (×14): 40 mg via ORAL
  Filled 2024-06-11 (×13): qty 1

## 2024-06-11 NOTE — Progress Notes (Signed)
 Physical Therapy Treatment Patient Details Name: Kimberly [name] MRN: [id number] DOB: Feb 05, 1956 Today's Date: 06/11/2024   History of Present Illness 68 y.o. female who presented 06/08/24 via EMS after a fall. Cervical MRI negative; T11/12 multi column Chance type fracture (7/18 ORIF T11-L1); L femur fx (7/18 ORIF) PMH significant for CAD, CHF, COPD, Cirrhosis, Fibromyalgia, HTN, HLD, Hypothyroidism, OSA, Pulm HTN, Seizures, CVA.    PT Comments  Pt issued and performed supine and seated exercises for LLE. She demonstrated AAROM 0-90 knee flexion. Patient moving better today with requiring most assist for bed mobility (up to mod assist), sit to stand and ambulated 6 ft with RW with min assist. Making excellent progress. Patient will benefit from continued inpatient follow up therapy, >3 hours/day     If plan is discharge home, recommend the following: Two people to help with walking and/or transfers;A lot of help with bathing/dressing/bathroom;Assistance with cooking/housework;Assist for transportation;Help with stairs or ramp for entrance   Can travel by private vehicle        Equipment Recommendations  None recommended by PT    Recommendations for Other Services       Precautions / Restrictions Precautions Precautions: Back Precaution Booklet Issued: Yes (comment) Recall of Precautions/Restrictions: Impaired (cues to recall back precautions) Precaution/Restrictions Comments: brace donned seated; no OOB without brace; may remove to shower Required Braces or Orthoses: Spinal Brace Spinal Brace: Thoracolumbosacral orthotic;Applied in sitting position Restrictions Weight Bearing Restrictions Per Provider Order: Yes LLE Weight Bearing Per Provider Order: Touchdown weight bearing     Mobility  Bed Mobility Overal bed mobility: Needs Assistance Bed Mobility: Rolling, Sidelying to Sit Rolling: Mod assist Sidelying to sit: Min assist, Used rails, HOB elevated       General bed  mobility comments: assist to bend LLE prior to rolling to her rt; Once sidelying, min A to initiate rise progressing to CGA; intermittent assist to scoot L hip toward EOB    Transfers Overall transfer level: Needs assistance Equipment used: Rolling walker (2 wheels) Transfers: Sit to/from Stand Sit to Stand: Min assist, +2 safety/equipment (ICU bed)           General transfer comment: Cues initially for hand placement with pt needing min A for rise up from edge of ICU bed; pt then with good maintenance of LLE precautions maintaining NWB throughout and min A for balance    Ambulation/Gait Ambulation/Gait assistance: Min assist, +2 safety/equipment Gait Distance (Feet): 6 Feet Assistive device: Rolling walker (2 wheels) Gait Pattern/deviations: Step-to pattern       General Gait Details: able to use UEs to support her weight and advance RLE while maintaining TDWB LLE; limited by length of purewick tubing   Stairs             Wheelchair Mobility     Tilt Bed    Modified Rankin (Stroke Patients Only)       Balance Overall balance assessment: Needs assistance Sitting-balance support: No upper extremity supported Sitting balance-Leahy Scale: Good Sitting balance - Comments: statically   Standing balance support: Bilateral upper extremity supported Standing balance-Leahy Scale: Poor Standing balance comment: reliant on device and external assist intermittently                            Communication Communication Communication: No apparent difficulties  Cognition Arousal: Alert Behavior During Therapy: [facility] for tasks assessed/performed  Following commands: Intact      Cueing Cueing Techniques: Verbal cues  Exercises General Exercises - Lower Extremity Ankle Circles/Pumps: AROM, Both, 5 reps Long Arc Quad: AROM, Left, 5 reps Heel Slides: AAROM, Left, 5 reps Hip ABduction/ADduction: AAROM, Left, 5  reps    General Comments General comments (skin integrity, edema, etc.): Educated on no pillow under left knee when in bed to work on extension (does have full extension)      Pertinent Vitals/Pain Pain Assessment Pain Assessment: 0-10 Pain Score: 5  Pain Location: back, LLE Pain Descriptors / Indicators: Aching, Sore Pain Intervention(s): Limited activity within patient's tolerance, Monitored during session, Repositioned    Home Living                          Prior Function            PT Goals (current goals can now be found in the care plan section) Acute Rehab PT Goals Patient Stated Goal: avoid going to SNF Time For Goal Achievement: 06/23/24 Potential to Achieve Goals: Good Progress towards PT goals: Progressing toward goals    Frequency    Min 3X/week      PT Plan      Co-evaluation              AM-PAC PT 6 Clicks Mobility   Outcome Measure  Help needed turning from your back to your side while in a flat bed without using bedrails?: A Lot Help needed moving from lying on your back to sitting on the side of a flat bed without using bedrails?: A Little Help needed moving to and from a bed to a chair (including a wheelchair)?: Total Help needed standing up from a chair using your arms (e.g., wheelchair or bedside chair)?: Total Help needed to walk in hospital room?: Total Help needed climbing 3-5 steps with a railing? : Total 6 Click Score: 9    End of Session Equipment Utilized During Treatment: Gait belt Activity Tolerance: Patient tolerated treatment well Patient left: in chair;with call bell/phone within reach;with chair alarm set Nurse Communication: Mobility status;Weight bearing status;Other (comment) (on RA) PT Visit Diagnosis: Other abnormalities of gait and mobility (R26.89);History of falling (Z91.81);Muscle weakness (generalized) (M62.81)     Time: 8658-8591 PT Time Calculation (min) (ACUTE ONLY): 27 min  Charges:     $Gait Training: 8-22 mins $Therapeutic Exercise: 8-22 mins PT General Charges $$ ACUTE PT VISIT: 1 Visit                      Macario RAMAN, PT Acute Rehabilitation Services  Office [phone]    Macario SHAUNNA Soja 06/11/2024, 2:54 PM

## 2024-06-11 NOTE — Progress Notes (Signed)
    Providing Compassionate, Quality Care - Together   NEUROSURGERY PROGRESS NOTE     S: No issues overnight.    O: EXAM:  BP 130/76 (BP Location: Left Arm)   Pulse 72   Temp 98 F (36.7 C) (Oral)   Resp 18   Ht 5' (1.524 m)   Wt 63.3 kg   SpO2 100%   BMI 27.25 kg/m     Awake, alert, oriented  Speech fluent, appropriate  Strength/sensation grossly intact BUE/BLE, LLE minimally tested Incisions c/d/i   ASSESSMENT:  68 y.o. s/p T11-L1 posterior percutaneous instrumentation for Chance fracture     PLAN: -TLSO when OOB -Continue therapies as tolerated -Appropriate for outpt f/u.  -Call w/ questions/concerns.   Camie Pickle, [facility]

## 2024-06-11 NOTE — Progress Notes (Signed)
   Trauma/Critical Care Follow Up Note  Subjective:    Overnight Issues:  pain relatively well controlled.  No BM since fall.  Eating ok, but not great.    Objective:  Vital signs for last 24 hours: Temp:  [96.8 F (36 C)-98.6 F (37 C)] 98 F (36.7 C) (07/21 0800) Pulse Rate:  [64-79] 72 (07/21 0836) Resp:  [7-20] 18 (07/21 0836) BP: (97-132)/(57-119) 130/76 (07/21 0800) SpO2:  [91 %-100 %] 100 % (07/21 0836) FiO2 (%):  [28 %] 28 % (07/21 0836)  Intake/Output from previous day: 07/20 0701 - 07/21 0700 In: 1206 [P.O.:1200; I.V.:6] Out: 3550 [Urine:3550]  Intake/Output this shift: Total I/O In: 3 [I.V.:3] Out: -   Physical Exam:  Gen: comfortable, no distress Neuro: follows commands, alert, communicative HEENT: PERRL Neck: supple CV: RRR Pulm: unlabored breathing on RA Abd: soft, NT  ,   Extr: wwp, no edema, L thigh dressing clean and dry, compartments soft  Results for orders placed or performed during the hospital encounter of 06/08/24 (from the past 24 hours)  Glucose, capillary     Status: None   Collection Time: 06/11/24  4:02 AM  Result Value Ref Range   Glucose-Capillary 98 70 - 99 mg/dL  Glucose, capillary     Status: Abnormal   Collection Time: 06/11/24  8:01 AM  Result Value Ref Range   Glucose-Capillary 105 (H) 70 - 99 mg/dL    Assessment & Plan: The plan of care was discussed with the bedside nurse for the day, who is in agreement with this plan and no additional concerns were raised.   Present on Admission:  Thoracic spine fracture (HCC)    LOS: 3 days   Additional comments:I reviewed the patient's new clinical lab test results.   and I reviewed the patients new imaging test results.    GLF 7/18 L femur fx - s/p IMN ([facility]y)- TDWB LLE Presacral hematoma with possible occult sacral fx - Ortho consulted. Trend hgb- stable.  T11-T12 fx - s/p T11-L1 posterior percutaneous instrumentation Cathy). TLSO brace when out of bed Neck pain - CT neg for  fx, MRI with degenerative disc disease at C6-7 with mild-to-moderate central spinal canal stenosis and mild left neural foraminal stenosis, no acute injury L elbow pain - xray neg R shoulder pain - xray neg R knee pain - xray neg  Hx Osteoporosis Hx CAD Hx CHF Hx COPD Hx Cirrhosis Hx Fibromyalgia Hx HTN - reports resolved since she had her duodenal switch Hx HLD -  reports resolved since she had her duodenal switch Hx Hypothyroidism Hx OSA -  reports no longer on CPAP since she had her duodenal switch Hx Pulm HTN Hx Seizures Hx CVA   Hx duodenal switch - avoid NSAIDs, multi. CBG monitoring given she reports episodes of hypoglycemia since surgery, but sugars look good. FEN - regular diet VTE - SCDs, Lovenox  to start today.  Ok's with NSGY ID - None currently.  Foley - foley out 7/19 Dispo - 4NP, therapies rec CIR   Kimberly FORBES Banter, PA-C  Trauma & General Surgery Please use [email or web address] to contact on call provider  06/11/2024  *Care during the described time interval was provided by me. I have reviewed this patient's available data, including medical history, events of note, physical examination and test results as part of my evaluation.

## 2024-06-11 NOTE — TOC Initial Note (Signed)
 Transition of Care [facility]) - Initial/Assessment Note    Patient Details  Name: Kimberly [name] MRN: [id number] Date of Birth: Feb 07, 1956  Transition of Care [facility]) CM/SW Contact:    Brenlyn Beshara E Ulrick Methot, LCSW Phone Number: 06/11/2024, 3:43 PM  Clinical Narrative:                 Patient admitted for a fall. CIR is recommended, per chart review CIR is starting insurance authorization. TOC will follow.    Expected Discharge Plan: IP Rehab Facility Barriers to Discharge: Continued Medical Work up   Patient Goals and CMS Choice            Expected Discharge Plan and Services       Living arrangements for the past 2 months: Single Family Home                                      Prior Living Arrangements/Services Living arrangements for the past 2 months: Single Family Home Lives with:: Spouse Patient language and need for interpreter reviewed:: Yes Do you feel safe going back to the place where you live?: Yes      Need for Family Participation in Patient Care: Yes (Comment) Care giver support system in place?: Yes (comment)   Criminal Activity/Legal Involvement Pertinent to Current Situation/Hospitalization: No - Comment as needed  Activities of Daily Living   ADL Screening (condition at time of admission) Independently performs ADLs?: No (able to do most things, needs help) Does the patient have a NEW difficulty with bathing/dressing/toileting/self-feeding that is expected to last >3 days?: Yes (Initiates electronic notice to provider for possible OT consult) Does the patient have a NEW difficulty with getting in/out of bed, walking, or climbing stairs that is expected to last >3 days?: Yes (Initiates electronic notice to provider for possible PT consult) Does the patient have a NEW difficulty with communication that is expected to last >3 days?: No Is the patient deaf or have difficulty hearing?: No Does the patient have difficulty seeing, even when wearing  glasses/contacts?: Yes Does the patient have difficulty concentrating, remembering, or making decisions?: No  Permission Sought/Granted                  Emotional Assessment       Orientation: : Oriented to Self, Oriented to Place, Oriented to  Time, Oriented to Situation Alcohol  / Substance Use: Not Applicable Psych Involvement: No (comment)  Admission diagnosis:  Thrombocytopenia (HCC) [D69.6] Thoracic spine fracture (HCC) [S22.009A] Macrocytic anemia [D53.9] Elevated random blood glucose level [R73.9] Fall at home, initial encounter [W19.XXXA, Y92.009] Fracture of proximal end of left femur, closed, initial encounter (HCC) [S72.002A] Closed fracture of twelfth thoracic vertebra, unspecified fracture morphology, initial encounter [facility]) [S22.089A] Patient Active Problem List   Diagnosis Date Noted   Thoracic spine fracture (HCC) 06/08/2024   Copper  deficiency 04/30/2024   Vitamin B12 deficiency 04/30/2024   RUQ pain 07/19/2023   Family history of colon cancer 06/28/2023   Closed fracture of head of right humerus 03/23/2023   Blood in stool 02/12/2023   Closed fracture of right proximal humerus 02/11/2023   Left patella fracture 02/11/2023   Accidental fall 02/11/2023   GI bleed 02/11/2023   Nasal fracture 02/11/2023   Thrombocytopenia (HCC) 02/11/2023   History of cirrhosis 02/11/2023   Hemorrhoids 01/17/2023   Gastroesophageal reflux disease without esophagitis 01/17/2023   Diarrhea 05/17/2022   Lower  abdominal pain 05/17/2022   Nausea without vomiting 05/17/2022   Unilateral primary osteoarthritis, left knee 02/18/2022   IBS (irritable bowel syndrome) 06/08/2021   Chronic diarrhea 06/08/2021   Irritable bowel syndrome with diarrhea 06/08/2021   Quadriceps weakness 03/19/2021   S/P total knee arthroplasty, right 12/18/2020   Morbid obesity (HCC) 02/08/2018   Pancytopenia (HCC) 04/09/2017   Liver cirrhosis secondary to NASH (HCC) 04/09/2017   DM type 2 causing  vascular disease (HCC) 04/06/2017   Essential hypertension 04/06/2017   OSA (obstructive sleep apnea) 04/06/2017   Asthma in adult 04/06/2017   Obesity (BMI 30-39.9) 04/06/2017   Primary osteoarthritis of both hands 10/26/2016   Primary osteoarthritis of both feet 10/26/2016   COPD (chronic obstructive pulmonary disease) (HCC) 09/27/2016   Acquired hypothyroidism 09/27/2016   Vertebral compression fracture (HCC) 09/27/2016   Fibromyalgia 09/27/2016   Restless leg syndrome 09/27/2016   Lumbar radiculitis 10/01/2014   Nerve pain 08/30/2014   Lumbar stenosis with neurogenic claudication 08/28/2014   Spondylosis of lumbar region without myelopathy or radiculopathy 08/23/2014   Lumbosacral stenosis with neurogenic claudication 08/23/2014   Vaginal discharge 06/22/2014   Chest pain 06/21/2014   Spinal stenosis 08/23/2011   Obstructive sleep apnea 08/23/2011   Diabetes (HCC) 08/12/2010   Coronary atherosclerosis 08/12/2010   Chronic diastolic heart failure (HCC) 08/12/2010   Mixed hyperlipidemia 06/23/2009   Overweight 06/23/2009   Essential hypertension, benign 06/23/2009   CHEST PAIN-UNSPECIFIED 06/23/2009   PCP:  Trudy Vaughn FALCON, MD Pharmacy:   [facility] - [place], [place] - [address] [place] [address] Phone: [phone] Fax: [phone]  [facility] - [place], [place] - [address] [place] [address] Phone: [phone] Fax: [phone]     Social Drivers of Health (SDOH) Social History: SDOH Screenings   Food Insecurity: Food Insecurity Present (06/09/2024)  Housing: Low Risk  (06/09/2024)  Transportation Needs: No Transportation Needs (06/09/2024)  Utilities: Not At Risk (06/09/2024)  Depression (PHQ2-9): Low Risk  (04/13/2024)  Financial Resource Strain: Low Risk  (06/29/2023)  Recent Concern: Financial Resource Strain - Medium Risk (06/29/2023)  Social Connections: Moderately Integrated  (06/09/2024)  Tobacco Use: Low Risk  (06/08/2024)  Health Literacy: Adequate Health Literacy (08/30/2023)   SDOH Interventions:     Readmission Risk Interventions    02/15/2023    9:21 AM  Readmission Risk Prevention Plan  Post Dischage Appt Complete  Medication Screening Complete  Transportation Screening Complete

## 2024-06-11 NOTE — Progress Notes (Signed)
 Patient hip changed to mepilex dressing as ordered. Fredie LITTIE Morones, RN

## 2024-06-11 NOTE — Progress Notes (Signed)
 Orthopaedic Trauma Service Progress Note  Patient ID: Kimberly [name] MRN: [id number] DOB/AGE: 1956/05/20 68 y.o.  Subjective:  Pain controlled Does note some pain above her L knee and L thigh/hip flexor weakness. Discussed these are expected post op findings  ROS As above  Today's  total administered Morphine  Milligram Equivalents: 20 Yesterday's total administered Morphine  Milligram Equivalents: 56  Objective:   VITALS:   Vitals:   06/11/24 0200 06/11/24 0321 06/11/24 0800 06/11/24 0836  BP:  121/70 130/76   Pulse: 66 73 72 72  Resp: (!) 9 20 13 18   Temp:  98 F (36.7 C) 98 F (36.7 C)   TempSrc:  Oral Oral   SpO2: 99% 100% 100% 100%  Weight:      Height:        Estimated body mass index is 27.25 kg/m as calculated from the following:   Height as of this encounter: 5' (1.524 m).   Weight as of this encounter: 63.3 kg.   Intake/Output      07/20 0701 07/21 0700 07/21 0701 07/22 0700   P.O. 1200    I.V. (mL/kg) 6 (0.1) 3 (0)   IV Piggyback     Total Intake(mL/kg) 1206 (19.1) 3 (0)   Urine (mL/kg/hr) 3550 (2.3) 950 (3)   Stool     Total Output 3550 950   Net -2344 -947          LABS  Results for orders placed or performed during the hospital encounter of 06/08/24 (from the past 24 hours)  Glucose, capillary     Status: None   Collection Time: 06/11/24  4:02 AM  Result Value Ref Range   Glucose-Capillary 98 70 - 99 mg/dL  Glucose, capillary     Status: Abnormal   Collection Time: 06/11/24  8:01 AM  Result Value Ref Range   Glucose-Capillary 105 (H) 70 - 99 mg/dL  Glucose, capillary     Status: None   Collection Time: 06/11/24 11:57 AM  Result Value Ref Range   Glucose-Capillary 84 70 - 99 mg/dL     PHYSICAL EXAM:   Gen: NAD, looks good, resting comfortably in bed  Lungs: unlabored Ext:       Left Lower Extremity Dressing is clean, dry and intact              Extremity is warm             No DCT             Compartments are soft             No pain out of proportion with passive stretching of his toes or ankle             DPN, SPN, TN sensory functions are intact             EHL, FHL, lesser toe motor functions intact             Ankle flexion, extension, inversion eversion grossly intact              + DP pulse  Assessment/Plan: 3 Days Post-Op   Principal Problem:   Thoracic spine fracture (HCC)   Anti-infectives (From admission, onward)    Start     Dose/Rate Route Frequency Ordered Stop   06/09/24  0600  ceFAZolin  (ANCEF ) IVPB 2g/100 mL premix        2 g 200 mL/hr over 30 Minutes Intravenous On call to O.R. 06/08/24 1516 06/08/24 1619   06/09/24 0000  ceFAZolin  (ANCEF ) IVPB 2g/100 mL premix        2 g 200 mL/hr over 30 Minutes Intravenous Every 6 hours 06/08/24 2204 06/09/24 0603   06/08/24 2215  ceFAZolin  (ANCEF ) IVPB 2g/100 mL premix  Status:  Discontinued        2 g 200 mL/hr over 30 Minutes Intravenous Every 8 hours 06/08/24 2204 06/08/24 2242     .  POD/HD#: 33   68 year old female with baseline osteoporosis status post fall with left proximal femoral shaft fracture and T11-12 chance fractures   - fall, osteoporosis    - Left proximal femoral shaft fracture with significant comminution s/p IMN             Touchdown weightbearing left leg with assistance             No motion restrictions to hip or knee             Dressing changes as needed starting today    Mepilex to L thigh    Ok to leave open to air once no drainage and clean with soap and water  only               Ice as needed             Therapy evaluations  PT- please teach HEP for Left knee ROM- AROM, PROM. Prone exercises as well. No ROM restrictions.  Quad sets, SLR, LAQ, SAQ, heel slides, stretching, prone flexion and extension  Ankle theraband program, heel cord stretching, toe towel curls, etc  No pillows under bend of knee when at rest, ok to place  under heel to help work on extension. Can also use zero knee bone foam if available DO NOT LET KNEE REST IN FLEXION!!!!!     - DVT/PE prophylaxis:             Per TS and NSGY - ID:              Perioperative antibiotics completed    - Metabolic Bone Disease:             Known osteoporosis             Vitamin D  deficiency                         Supplement   - Dispo:             Continue with therapies             Ortho issues addressed             Follow-up with orthopedics in 2 weeks  Stable for CIR      Francis MICAEL Mt, PA-C [phone] (C) 06/11/2024, 11:59 AM  [facility] [address] [place] [address] [phone] [phone] (F)    After 5pm and on the weekends please log on to Amion, go to orthopaedics and the look under the [facility] Call for the provider(s) on call. You can also call our office at [phone] and then follow the prompts to be connected to the call team.  Patient ID: Kimberly [name], female   DOB: 06/24/1956, 68 y.o.   MRN: [id number]

## 2024-06-11 NOTE — Progress Notes (Signed)
 Inpatient Rehab Admissions Coordinator:    I spoke with Pt. Regarding potential CIR admit. She is interested, states that her husband can do 24/7 assist. I will send case to insurance today.   Leita Kleine, MS, CCC-SLP Rehab Admissions Coordinator  [phone] (celll) [phone] (office)

## 2024-06-11 NOTE — Consult Note (Signed)
 Physical Medicine and Rehabilitation Consult Reason for Consult:polytrauma Referring Physician: Trauma service   HPI: Kimberly [name] is a 68 y.o. female with history of CAD and CHF, chronic low back pain, cirrhosis, asthma, fibromyalgia and COPD who presented on 06/08/2024 after a fall.  She apparently had started on gabapentin  and Ambien  recently and lost her balance while she was going to the bathroom overnight.The patient sustained a left proximal femoral shaft fracture with significant comminution which required an ORIF and IMN by Dr. Celena on 06/08/2024.  She is touchdown weightbearing on the left lower extremity.  She sustained a presacral hematoma with possible occult sacral fracture, conservative management recommended.  Patient sustained a T11-T12 Chance fracture and was seen by neurosurgery.  Patient underwent open reduction of T11-T12 fracture and T11-L1 posterior percutaneous instrumentation by Dr. Debby on 06/08/2024 as well.  CT of the cervical spine was negative for fracture.  MRI demonstrated degenerative disc disease at C6-C7 with mild to moderate central and foraminal stenosis, conservative care was recommended.  Patient with multiple other areas of pain but these areas were negative for fracture.  Foley was removed on 06/09/2024.  Patient was up with therapies over the weekend and was min assist for sit to stand transfers and was able to pivot at the edge of the bed to the chair using a rolling walker.  Gait was not tested.  With Occupational Therapy patient was minimal to maximal assist depending upon the activity.  Patient was independent using a single-point cane prior to this admission.  She lives with her spouse in a 1 level house with one-step to enter.    Home: Home Living Family/patient expects to be discharged to:: Private residence Living Arrangements: Spouse/significant other Available Help at Discharge: Family, Available 24 hours/day Type of Home: House Home  Access: Stairs to enter Secretary/administrator of Steps: 1 Entrance Stairs-Rails: None Home Layout: One level Bathroom Shower/Tub: Engineer, manufacturing systems: Standard Home Equipment: Agricultural consultant (2 wheels), [employer] - single point (hemi walker)  Functional History: Prior Function Prior Level of Function : Independent/Modified Independent, Needs assist Mobility Comments: ambulates with SPC ADLs Comments: does not drive, recent assist with medication management due to difficulty reading labels Functional Status:  Mobility: Bed Mobility Overal bed mobility: Needs Assistance Bed Mobility: Rolling, Sidelying to Sit Rolling: +2 for physical assistance, +2 for safety/equipment, Max assist Sidelying to sit: Min assist, Used rails General bed mobility comments: heavy assist for rolling right with cues for hand placement, LE positioning. Once sidelying, min A to initiate rise progressing to CGA; intermittent assist to scoot L hip toward EOB Transfers Overall transfer level: Needs assistance Equipment used: Rolling walker (2 wheels) Transfers: Sit to/from Stand, Bed to chair/wheelchair/BSC Sit to Stand: Min assist, +2 safety/equipment (ICU bed) Bed to/from chair/wheelchair/BSC transfer type:: Step pivot Stand pivot transfers: Min assist, +2 safety/equipment General transfer comment: Cues initially for hand placement with pt needing min A for rise up from edge of ICU bed; pt then with good maintenance of LLE precautions maintaining NWB throughout and min A for balance white pivoting heel/toe toward R to chair with use of RW Ambulation/Gait General Gait Details: unable; used heel/toe/shimmy to advance RLE from bed to chair    ADL: ADL Overall ADL's : Needs assistance/impaired Eating/Feeding: Set up, Sitting Grooming: Set up, Sitting Upper Body Bathing: Set up, Sitting Lower Body Bathing: Maximal assistance, Sit to/from stand Upper Body Dressing : Maximal assistance, Sitting Upper Body  Dressing Details (indicate  cue type and reason): brace application Lower Body Dressing: Maximal assistance, Sit to/from stand Toilet Transfer: Minimal assistance, +2 for physical assistance, +2 for safety/equipment, Stand-pivot, Rolling walker (2 wheels) Toilet Transfer Details (indicate cue type and reason): simulated to chair Toileting- Clothing Manipulation and Hygiene: Moderate assistance, Sit to/from stand Functional mobility during ADLs: Minimal assistance, +2 for safety/equipment, +2 for physical assistance, Rolling walker (2 wheels)  Cognition: Cognition Orientation Level: Oriented X4 Cognition Arousal: Alert Behavior During Therapy: WFL for tasks assessed/performed   Review of Systems  Constitutional:  Positive for malaise/fatigue.  HENT: Negative.    Eyes: Negative.   Respiratory:  Positive for cough and shortness of breath.   Cardiovascular: Negative.   Gastrointestinal:  Positive for constipation and nausea.  Genitourinary: Negative.   Musculoskeletal:  Positive for back pain, falls, joint pain and myalgias.  Skin:  Negative for itching.  Neurological:  Positive for sensory change, focal weakness and weakness.  Psychiatric/Behavioral:  Negative for depression.    Past Medical History:  Diagnosis Date   Anemia    PMH: as a child   Arthritis    Asthma    CAD (coronary artery disease)    nonobstructive by cath, 6/08 (false positive Cardiolite ) normal stress echo, 7/11   CHF (congestive heart failure) (HCC)    Chronic back pain    Chronic bronchitis (HCC)    Cirrhosis (HCC)    Complication of anesthesia    had an asthma attack when woke up from procedure   Complication of anesthesia    asthma attack with one surgery   COPD (chronic obstructive pulmonary disease) (HCC)    Degenerative joint disease    Depression    DJD (degenerative joint disease)    Fatty liver    Fibromyalgia    GERD (gastroesophageal reflux disease)    Glaucoma    Headache(784.0)     Heart failure, diastolic, chronic (HCC)    Heart murmur    PMH:As a child only   Hernia of abdominal cavity    upper and lower hernia   History of hiatal hernia    HTN (hypertension)    Hypertension    Hypothyroidism    IBS (irritable bowel syndrome)    Morbid obesity (HCC)    Neuropathy    associated with diabetes   Obstructive sleep apnea    Pancreatitis    Pinched nerve in neck    Pneumonia    as a child   PONV (postoperative nausea and vomiting)    Pulmonary hypertension (HCC)    Rheumatic fever    PMH: as a child   Seizures (HCC)    PMH: only as a child   Seizures (HCC)    in childhood   Shortness of breath    Sleep apnea    Spinal stenosis    Stroke (HCC)    12/09/2013   Past Surgical History:  Procedure Laterality Date   ABDOMINAL HYSTERECTOMY     ABDOMINAL HYSTERECTOMY     partial hysterectomy   APPENDECTOMY     BACK SURGERY     BACK SURGERY     disc surgery with complications and damage to right side   BARIATRIC SURGERY     DS in June 2020   BILATERAL KNEE ARTHROSCOPY  2000, 2009   BIOPSY  06/16/2021   Procedure: BIOPSY;  Surgeon: Eartha Angelia Sieving, MD;  Location: [facility];  Service: Gastroenterology;;   BREAST CYST EXCISION Left    CARDIAC CATHETERIZATION  2009   CATARACT EXTRACTION W/ INTRAOCULAR LENS  IMPLANT, BILATERAL     CHOLECYSTECTOMY  11/22/1992   CHOLECYSTECTOMY     COLONOSCOPY N/A 08/01/2013   Procedure: COLONOSCOPY;  Surgeon: Claudis RAYMOND Rivet, MD;  Location: [facility];  Service: Endoscopy;  Laterality: N/A;  930   COLONOSCOPY N/A 08/03/2017   Procedure: COLONOSCOPY;  Surgeon: Rivet Claudis RAYMOND, MD;  Location: [facility];  Service: Endoscopy;  Laterality: N/A;  12:00   COLONOSCOPY WITH PROPOFOL  N/A 06/28/2023   Procedure: COLONOSCOPY WITH PROPOFOL ;  Surgeon: Eartha Angelia Sieving, MD;  Location: [facility];  Service: Gastroenterology;  Laterality: N/A;  12:30pm;asa 3   CYST EXCISION     Left breast    ESOPHAGOGASTRODUODENOSCOPY (EGD) WITH PROPOFOL  N/A 06/16/2021   Procedure: ESOPHAGOGASTRODUODENOSCOPY (EGD) WITH PROPOFOL ;  Surgeon: Eartha Angelia Sieving, MD;  Location: [facility];  Service: Gastroenterology;  Laterality: N/A;  12:50   ESOPHAGOGASTRODUODENOSCOPY (EGD) WITH PROPOFOL  N/A 06/28/2023   Procedure: ESOPHAGOGASTRODUODENOSCOPY (EGD) WITH PROPOFOL ;  Surgeon: Eartha Angelia Sieving, MD;  Location: [facility];  Service: Gastroenterology;  Laterality: N/A;  12:30pm;asa 3   EXCISIONAL HEMORRHOIDECTOMY     EYE SURGERY  11/22/1965   EYE SURGERY     bilateral cataract removal with lens implants   EYE SURGERY     growth removed from right eye lid   EYE SURGERY     bilateral eye surgery age 68 tto straighten eyes'   heel (other)  11/23/2003   HEEL SPUR SURGERY     Right heel - growth removal and rebuilding of heel   HEMORRHOID SURGERY     HERNIA REPAIR     KNEE ARTHROSCOPY Bilateral    LUMBAR LAMINECTOMY/DECOMPRESSION MICRODISCECTOMY Right 08/23/2014   Procedure: LUMBAR LAMINECTOMY/DECOMPRESSION MICRODISCECTOMY 1 LEVEL;  Surgeon: Victory DELENA Gunnels, MD;  Location: [facility];  Service: Neurosurgery;  Laterality: Right;  LUMBAR LAMINECTOMY/DECOMPRESSION MICRODISCECTOMY 1 LEVEL LUMBAR 2-3   ovaries removed  11/22/2001   PARTIAL HYSTERECTOMY  11/23/1979   SHOULDER ARTHROSCOPY WITH DISTAL CLAVICLE RESECTION Left 08/06/2022   Procedure: SHOULDER ARTHROSCOPY WITH DISTAL CLAVICLE RESECTION;  Surgeon: Sharl Selinda Dover, MD;  Location: [facility] OR;  Service: Orthopedics;  Laterality: Left;   SHOULDER ARTHROSCOPY WITH ROTATOR CUFF REPAIR Left 08/06/2022   Procedure: SHOULDER ARTHROSCOPY WITH ROTATOR CUFF REPAIR;  Surgeon: Sharl Selinda Dover, MD;  Location: [facility] OR;  Service: Orthopedics;  Laterality: Left;   SHOULDER ARTHROSCOPY WITH SUBACROMIAL DECOMPRESSION Left 08/06/2022   Procedure: SHOULDER ARTHROSCOPY WITH SUBACROMIAL DECOMPRESSIONWITH EXTENSIVE DEBRIDEMENT;  Surgeon: Sharl Selinda Dover, MD;  Location: [facility] OR;  Service: Orthopedics;  Laterality: Left;   SPHINCTEROTOMY     spinahatomy  11/22/1990   TOTAL KNEE ARTHROPLASTY Right 11/05/2020   Procedure: RIGHT TOTAL KNEE ARTHROPLASTY;  Surgeon: Barbarann Oneil BROCKS, MD;  Location: [facility];  Service: Orthopedics;  Laterality: Right;   TUBAL LIGATION  11/22/1973   TUBAL LIGATION     Family History  Problem Relation Age of Onset   Colon cancer Brother    Cancer Other    Heart failure Other    Breast cancer Neg Hx    Social History:  reports that she has never smoked. She has never been exposed to tobacco smoke. She has never used smokeless tobacco. She reports that she does not drink alcohol  and does not use drugs. Allergies:  Allergies  Allergen Reactions   Aleve [Naproxen] Anaphylaxis, Swelling and Other (See Comments)    Throat closes   Cinnamon Anaphylaxis  Reaction caused by a medication interaction.   Other Shortness Of Breath and Other (See Comments)    Local anesthesia - triggers asthma attacks  08/06/22 tolerated amide local anesthetic in peripheral nerve block.   Roxicodone  [Oxycodone ] Other (See Comments)    Over sedation   Atrovent Nasal Spray [Ipratropium] Other (See Comments)    Epistaxis    Feldene [Piroxicam] Nausea Only   Flonase [Fluticasone ] Other (See Comments)    Epistaxis    Lyrica [Pregabalin] Other (See Comments)    Altered mental status for prolonged period   Nsaids Other (See Comments)    Hx of gastic bypass - told to avoid.   Phenergan [Promethazine Hcl] Other (See Comments)    Triggers asthma attacks   Sulfa Antibiotics Other (See Comments)    Yeast infection   Zohydro Er [Hydrocodone  Bitartrate Er] Other (See Comments)    Over sedation with IR and ER formulations   Latex Rash and Other (See Comments)    Skin blisters   Tape Rash and Other (See Comments)    Do not use adhesive tape; okay to use paper tape. 08/06/22 tolerated adhesive tape for peripheral IV   Medications Prior to  Admission  Medication Sig Dispense Refill   acetaminophen  (TYLENOL ) 500 MG tablet Take 500 mg by mouth 2 (two) times daily as needed for moderate pain (pain score 4-6), fever or headache.     albuterol  (VENTOLIN  HFA) 108 (90 Base) MCG/ACT inhaler Inhale 2 puffs into the lungs 4 (four) times daily as needed for shortness of breath.     Cholecalciferol  (VITAMIN D -3 PO) Take 1 capsule by mouth daily.     Copper  Gluconate 2 MG TABS Take 2 mg by mouth daily with supper.     cyclobenzaprine  (FLEXERIL ) 10 MG tablet Take 10 mg by mouth every 8 (eight) hours as needed for muscle spasms.     denosumab  (PROLIA ) 60 MG/ML SOSY injection Inject 60 mg into the skin every 6 (six) months.     famotidine  (PEPCID ) 40 MG tablet Take 40 mg by mouth daily.     furosemide  (LASIX ) 20 MG tablet Take 20 mg by mouth daily.     gabapentin  (NEURONTIN ) 100 MG capsule Take 100 mg by mouth 2 (two) times daily with a meal.     levothyroxine  (SYNTHROID ) 125 MCG tablet Take 125 mcg by mouth daily before breakfast.     Multiple Vitamins-Minerals (MULTIVITAMIN WOMEN 50+) TABS Take 1 tablet by mouth daily.     omeprazole  (PRILOSEC) 40 MG capsule Take 1 capsule (40 mg total) by mouth daily. 90 capsule 3   ondansetron  (ZOFRAN ) 4 MG tablet TAKE 1 TABLET BY MOUTH EVERY 8 HOURS AS NEEDED FOR NAUSEA OR VOMITING 30 tablet 1   spironolactone  (ALDACTONE ) 25 MG tablet Take 25 mg by mouth daily.     TRELEGY ELLIPTA 100-62.5-25 MCG/ACT AEPB Inhale 1 puff into the lungs every other day.     triamcinolone  cream (KENALOG ) 0.1 % Apply 1 Application topically 2 (two) times daily as needed (skin irritation).     zolpidem  (AMBIEN ) 5 MG tablet Take 5 mg by mouth at bedtime.       Blood pressure 130/76, pulse 72, temperature 98 F (36.7 C), temperature source Oral, resp. rate 18, height 5' (1.524 m), weight 63.3 kg, SpO2 100%. Physical Exam Constitutional:      General: She is not in acute distress.    Appearance: She is not ill-appearing.  HENT:      Head: Normocephalic.  Right Ear: External ear normal.     Left Ear: External ear normal.     Nose: Nose normal. Congestion: O2 Nixon.  Eyes:     Pupils: Pupils are equal, round, and reactive to light.  Cardiovascular:     Rate and Rhythm: Normal rate and regular rhythm.  Pulmonary:     Effort: Pulmonary effort is normal.  Abdominal:     General: There is no distension.     Palpations: Abdomen is soft.  Musculoskeletal:        General: Swelling (LLE) and tenderness present. Normal range of motion.     Cervical back: Normal range of motion.  Skin:    General: Skin is warm.     Comments: Left hip incision and low back incision dressed  Neurological:     Mental Status: She is alert.     Comments: Alert and oriented x 3. Normal insight and awareness. Intact Memory. Normal language and speech. Cranial nerve exam unremarkable. MMT: BUE 4 to 4+/5 prox to distal. RLE 3/5 HF, KE and 5/5 ADF/PF. LLE: 1/5 HF, KE and 4/5 ADF/PF. Sensation diminished to LT in both legs R>L. Sl loss of LT in median side of both hands.  DTR's 1+, toes down. SABRA    Psychiatric:        Mood and Affect: Mood normal.        Behavior: Behavior normal.     Results for orders placed or performed during the hospital encounter of 06/08/24 (from the past 24 hours)  Glucose, capillary     Status: None   Collection Time: 06/11/24  4:02 AM  Result Value Ref Range   Glucose-Capillary 98 70 - 99 mg/dL  Glucose, capillary     Status: Abnormal   Collection Time: 06/11/24  8:01 AM  Result Value Ref Range   Glucose-Capillary 105 (H) 70 - 99 mg/dL   No results found.  Assessment/Plan: Diagnosis: 68 year old female status post fall with comminuted left femoral shaft fracture requiring nailing and ORIF as well as T11-T12 chance fracture requiring open reduction and fusion Does the need for close, 24 hr/day medical supervision in concert with the patient's rehab needs make it unreasonable for this patient to be served in a  less intensive setting? Yes Co-Morbidities requiring supervision/potential complications:  - Acute on chronic pain and fibromyalgia -History of duodenal switch -Cirrhosis -History of sleep apnea -Hx of chronic polyradiculopathy with LE weakness and sensory loss -Hx of bilateral CTS with sensory loss -Neurogenic bowel  Due to bladder management, bowel management, safety, skin/wound care, disease management, medication administration, pain management, and patient education, does the patient require 24 hr/day rehab nursing? Yes Does the patient require coordinated care of a physician, rehab nurse, therapy disciplines of PT, OT to address physical and functional deficits in the context of the above medical diagnosis(es)? Yes Addressing deficits in the following areas: balance, endurance, locomotion, strength, transferring, bowel/bladder control, bathing, dressing, feeding, grooming, toileting, and psychosocial support Can the patient actively participate in an intensive therapy program of at least 3 hrs of therapy per day at least 5 days per week? Yes The potential for patient to make measurable gains while on inpatient rehab is excellent Anticipated functional outcomes upon discharge from inpatient rehab are supervision and min assist  with PT, supervision and min assist with OT, n/a with SLP. Estimated rehab length of stay to reach the above functional goals is: 13-16 days Anticipated discharge destination: Home Overall Rehab/Functional Prognosis: excellent  POST ACUTE RECOMMENDATIONS:  This patient's condition is appropriate for continued rehabilitative care in the following setting: CIR Patient has agreed to participate in recommended program. Yes Note that insurance prior authorization may be required for reimbursement for recommended care.  Comment: Pt was modified independent prior to this fall. She was dealing with lower extremity weakness and sensory loss leading up to this fall which  has only worsened. Pt is very motivated and has support at home. Rehab Admissions Coordinator to follow up.     MEDICAL RECOMMENDATIONS: Lovenox  30mg  q12 started today. Probably worthwhile checking LE dopplers as well As a start, increase miralax  to bid and add senokot-s 2 tabs at bedtime for bowels. Try sorbitol 30-60 cc x 1 if the above doesn't provide results.    I have personally performed a face to face diagnostic evaluation of this patient. Additionally, I have examined the patient's medical record including any pertinent labs and radiographic images.    Thanks,  Arthea ONEIDA Gunther, MD 06/11/2024

## 2024-06-11 NOTE — Plan of Care (Signed)

## 2024-06-12 LAB — CBC
HCT: 26.3 % — ABNORMAL LOW (ref 36.0–46.0)
HCT: 28.7 % — ABNORMAL LOW (ref 36.0–46.0)
Hemoglobin: 8.9 g/dL — ABNORMAL LOW (ref 12.0–15.0)
Hemoglobin: 9.5 g/dL — ABNORMAL LOW (ref 12.0–15.0)
MCH: 33.6 pg (ref 26.0–34.0)
MCH: 33.2 pg (ref 26.0–34.0)
MCHC: 33.8 g/dL (ref 30.0–36.0)
MCHC: 33.1 g/dL (ref 30.0–36.0)
MCV: 99.2 fL (ref 80.0–100.0)
MCV: 100.3 fL — ABNORMAL HIGH (ref 80.0–100.0)
Platelets: 90 K/uL — ABNORMAL LOW (ref 150–400)
Platelets: 107 K/uL — ABNORMAL LOW (ref 150–400)
RBC: 2.65 MIL/uL — ABNORMAL LOW (ref 3.87–5.11)
RBC: 2.86 MIL/uL — ABNORMAL LOW (ref 3.87–5.11)
RDW: 14.7 % (ref 11.5–15.5)
RDW: 14.7 % (ref 11.5–15.5)
WBC: 4 K/uL (ref 4.0–10.5)
WBC: 4.2 K/uL (ref 4.0–10.5)
nRBC: 0 % (ref 0.0–0.2)
nRBC: 0 % (ref 0.0–0.2)

## 2024-06-12 LAB — GLUCOSE, CAPILLARY
Glucose-Capillary: 86 mg/dL (ref 70–99)
Glucose-Capillary: 97 mg/dL (ref 70–99)
Glucose-Capillary: 83 mg/dL (ref 70–99)
Glucose-Capillary: 105 mg/dL — ABNORMAL HIGH (ref 70–99)
Glucose-Capillary: 133 mg/dL — ABNORMAL HIGH (ref 70–99)
Glucose-Capillary: 186 mg/dL — ABNORMAL HIGH (ref 70–99)

## 2024-06-12 MED ORDER — POLYVINYL ALCOHOL 1.4 % OP SOLN: 1.0000 [drp] | OPHTHALMIC | Status: DC | PRN

## 2024-06-12 NOTE — Progress Notes (Signed)
 Physical Therapy Treatment Patient Details Name: Kimberly [name] MRN: [id number] DOB: 03-22-1956 Today's Date: 06/12/2024   History of Present Illness 68 y.o. female who presented 06/08/24 via EMS after a fall. Cervical MRI negative; T11/12 multi column Chance type fracture (7/18 ORIF T11-L1); L femur fx (7/18 ORIF) PMH significant for CAD, CHF, COPD, Cirrhosis, Fibromyalgia, HTN, HLD, Hypothyroidism, OSA, Pulm HTN, Seizures, CVA.    PT Comments  Patient reported felt something in her chest/sternum/ribs pop when she was hopping with RW earlier today. Assessed tolerance for resisted triceps with no significant pain and pt agreed to attempt OOB and ambulation. Less assist for supine to side to sit. Patient assisting with donning brace in sitting. Progressed to standing with min assist and ambulating 10 ft with min assist and chair follow. Pt reporting chest not hurting like it did earlier today. Patient transferred chair to bed with min assist (had been sitting up recently and back was very painful in sitting).     If plan is discharge home, recommend the following: A lot of help with bathing/dressing/bathroom;Assistance with cooking/housework;Assist for transportation;Help with stairs or ramp for entrance;A little help with walking and/or transfers   Can travel by private vehicle        Equipment Recommendations  None recommended by PT    Recommendations for Other Services       Precautions / Restrictions Precautions Precautions: Back Precaution Booklet Issued: Yes (comment) Recall of Precautions/Restrictions: Impaired Precaution/Restrictions Comments: brace donned seated; no OOB without brace; may remove to shower Required Braces or Orthoses: Spinal Brace Spinal Brace: Thoracolumbosacral orthotic;Applied in sitting position Restrictions Weight Bearing Restrictions Per Provider Order: Yes LLE Weight Bearing Per Provider Order: Touchdown weight bearing     Mobility  Bed  Mobility Overal bed mobility: Needs Assistance Bed Mobility: Rolling, Sidelying to Sit Rolling: Min assist Sidelying to sit: Min assist, Used rails, HOB elevated       General bed mobility comments: assist to bend LLE prior to rolling to her rt; Once sidelying, min A to initiate rise progressing to CGA; intermittent assist to scoot L hip toward EOB    Transfers Overall transfer level: Needs assistance Equipment used: Rolling walker (2 wheels) Transfers: Sit to/from Stand Sit to Stand: Min assist           General transfer comment: vc for correct hand placement x 2 reps; steadying assist as transitioning hands from furniture up to RW    Ambulation/Gait Ambulation/Gait assistance: Min assist, +2 safety/equipment Gait Distance (Feet): 10 Feet Assistive device: Rolling walker (2 wheels) Gait Pattern/deviations: Step-to pattern       General Gait Details: able to use UEs to support her weight and advance RLE while maintaining TDWB LLE; limited by pain in chest/ribs when weight-bearing through arms   Stairs             Wheelchair Mobility     Tilt Bed    Modified Rankin (Stroke Patients Only)       Balance Overall balance assessment: Needs assistance Sitting-balance support: No upper extremity supported Sitting balance-Leahy Scale: Good Sitting balance - Comments: statically   Standing balance support: Bilateral upper extremity supported Standing balance-Leahy Scale: Poor Standing balance comment: reliant on device and external assist intermittently                            Communication Communication Communication: No apparent difficulties  Cognition Arousal: Alert Behavior During Therapy: [facility] for tasks assessed/performed  Following commands: Intact      Cueing Cueing Techniques: Verbal cues  Exercises General Exercises - Lower Extremity Ankle Circles/Pumps: AROM, Both, 5 reps Quad Sets: AROM,  Left, 10 reps Long Arc Quad: AROM, 10 reps, Seated, Left Heel Slides: AAROM, Both, 10 reps Hip ABduction/ADduction: AAROM, Left, 10 reps    General Comments General comments (skin integrity, edema, etc.): VSS      Pertinent Vitals/Pain Pain Assessment Pain Assessment: Faces Faces Pain Scale: Hurts even more Pain Location: back, LLE Pain Descriptors / Indicators: Aching, Sore Pain Intervention(s): Limited activity within patient's tolerance, Monitored during session    Home Living Family/patient expects to be discharged to:: Private residence Living Arrangements: Spouse/significant other Available Help at Discharge: Family;Available 24 hours/day Type of Home: House Home Access: Stairs to enter Entrance Stairs-Rails: None Entrance Stairs-Number of Steps: 1   Home Layout: One level Home Equipment: Agricultural consultant (2 wheels);Cane - single point      Prior Function            PT Goals (current goals can now be found in the care plan section) Acute Rehab PT Goals Patient Stated Goal: avoid going to SNF Time For Goal Achievement: 06/23/24 Potential to Achieve Goals: Good Progress towards PT goals: Progressing toward goals    Frequency    Min 3X/week      PT Plan      Co-evaluation              AM-PAC PT 6 Clicks Mobility   Outcome Measure  Help needed turning from your back to your side while in a flat bed without using bedrails?: A Lot Help needed moving from lying on your back to sitting on the side of a flat bed without using bedrails?: A Little Help needed moving to and from a bed to a chair (including a wheelchair)?: A Little Help needed standing up from a chair using your arms (e.g., wheelchair or bedside chair)?: A Little Help needed to walk in hospital room?: A Little Help needed climbing 3-5 steps with a railing? : Total 6 Click Score: 15    End of Session Equipment Utilized During Treatment: Gait belt Activity Tolerance: Patient tolerated  treatment well Patient left: with call bell/phone within reach;in bed;with bed alarm set Nurse Communication: Mobility status;Weight bearing status PT Visit Diagnosis: Other abnormalities of gait and mobility (R26.89);History of falling (Z91.81);Muscle weakness (generalized) (M62.81)     Time: 8675-8648 PT Time Calculation (min) (ACUTE ONLY): 27 min  Charges:    $Gait Training: 8-22 mins $Therapeutic Exercise: 8-22 mins PT General Charges $$ ACUTE PT VISIT: 1 Visit                      Macario RAMAN, PT Acute Rehabilitation Services  Office [phone]    Macario SHAUNNA Soja 06/12/2024, 2:57 PM

## 2024-06-12 NOTE — PMR Pre-admission (Signed)
 PMR Admission Coordinator Pre-Admission Assessment  Patient: Kimberly [name] is an 68 y.o., female MRN: [id number] DOB: June 26, 1956 Height: 5' (152.4 cm) Weight: 63.3 kg              Insurance Information HMO: yes    PPO:      PCP:      IPA:      80/20:      OTHER:  PRIMARY: UHC Medicare      Policy#: [id number], Medicare: [id number]      Subscriber: Pt CM Name:       Phone#: [phone]     Fax#: [phone] Pre-Cert#: [id number]  Pt. Won appeal on 06/18/24 and was approved for admit 7/24-7/30      Employer:  Benefits:  Phone #:      Name:  Eff Date: 04/22/2024 - 11/21/2024 Deductible: $0 (does not have deductible)  OOP Max: $3,900 ($753.82 met) CIR: $300/day co-pay for days 1-5, $0/day co-pay for days 6+ SNF: $0.00 Copayment per day for days 1-20; $203 Copayment per day for days 21-100 for Medicare-covered care/maximum 100 days/benefit period Outpatient: $20 copay/visit Home Health:  100% coverage; limited by medical necessity DME: 80% coverage; 20% co-insurance Providers: in network  SECONDARY:       Policy#:       Phone#:   Artist:       Phone#:    The Data processing manager" for patients in Inpatient Rehabilitation Facilities with attached "Privacy Act Statement-Health Care Records" was provided and verbally reviewed with: Patient  Emergency Contact Information Contact Information     Name Relation Home Work Mobile   [place] Spouse [phone]  [phone]   [place] Ronalee Blades [phone]  [phone]      Other Contacts     Name Relation Home Work Mobile   Hurd,April Sister [phone]        Current Medical History  Patient Admitting Diagnosis: Polytrauma History of Present Illness: Kimberly [name] is a 68 year old female with history of non-obst CAD, COPD, cirrhosis of liver secondary to NASH, OSA, multiple back surgeries with lumbar radiculopathy, BLE weakness s/p R-TKR and pending replacement on the left, obesity s/p  duodenal switch with resultant N/V/B/B incontinence, thrombocytopenia, and fibromyalgia who presented to the [facility] on 06/08/2024 after a fall.  She apparently had started on gabapentin  and Ambien  recently and lost her balance while she was going to the bathroom overnight.The patient sustained a left proximal femoral shaft fracture with significant comminution which required an ORIF and IMN by Dr. Celena on 06/08/2024.  She is touchdown weightbearing on the left lower extremity.  She sustained a presacral hematoma with possible occult sacral fracture, conservative management recommended.  Patient sustained a T11-T12 Chance fracture and was seen by neurosurgery.  Patient underwent open reduction of T11-T12 fracture and T11-L1 posterior percutaneous instrumentation by Dr. Debby on 06/08/2024 as well.  CT of the cervical spine was negative for fracture.  MRI demonstrated degenerative disc disease at C6-C7 with mild to moderate central and foraminal stenosis, conservative care was recommended.  Patient with multiple other areas of pain but these areas were negative for fracture.  Foley was removed on 06/09/2024.  Pt., seen by PT/OT and they recommended CIR to assist return to PLOF.    Glasgow Coma Scale Score: 15  Patient's medical [facility] has been reviewed by the rehabilitation admission coordinator and physician.  Past Medical History  Past Medical History:  Diagnosis Date  Anemia    PMH: as a child   Arthritis    Asthma    CAD (coronary artery disease)    nonobstructive by cath, 6/08 (false positive Cardiolite ) normal stress echo, 7/11   CHF (congestive heart failure) (HCC)    Chronic back pain    Chronic bronchitis (HCC)    Cirrhosis (HCC)    Complication of anesthesia    had an asthma attack when woke up from procedure   Complication of anesthesia    asthma attack with one surgery   COPD (chronic obstructive pulmonary disease) (HCC)     Degenerative joint disease    Depression    DJD (degenerative joint disease)    Fatty liver    Fibromyalgia    GERD (gastroesophageal reflux disease)    Glaucoma    Headache(784.0)    Heart failure, diastolic, chronic (HCC)    Heart murmur    PMH:As a child only   Hernia of abdominal cavity    upper and lower hernia   History of hiatal hernia    HTN (hypertension)    Hypertension    Hypothyroidism    IBS (irritable bowel syndrome)    Morbid obesity (HCC)    Neuropathy    associated with diabetes   Obstructive sleep apnea    Pancreatitis    Pinched nerve in neck    Pneumonia    as a child   PONV (postoperative nausea and vomiting)    Pulmonary hypertension (HCC)    Rheumatic fever    PMH: as a child   Seizures (HCC)    PMH: only as a child   Seizures (HCC)    in childhood   Shortness of breath    Sleep apnea    Spinal stenosis    Stroke (HCC)    12/09/2013    Has the patient had major surgery during 100 days prior to admission? Yes  Family History  family history includes Cancer in an other family member; Colon cancer in her brother; Heart failure in an other family member.   Current Medications   Current Facility-Administered Medications:    albuterol  (PROVENTIL ) (2.5 MG/3ML) 0.083% nebulizer solution 2.5 mg, 2.5 mg, Inhalation, QID PRN, Lovick, Ayesha N, MD   budesonide -glycopyrrolate -formoterol  (BREZTRI ) 160-9-4.8 MCG/ACT inhaler 2 puff, 2 puff, Inhalation, BID, Paola Dreama SAILOR, MD, 2 puff at 06/12/24 0807   calcium  carbonate (TUMS - dosed in mg elemental calcium ) chewable tablet 200 mg of elemental calcium , 1 tablet, Oral, TID WC PRN, Ann Fine, MD, 200 mg of elemental calcium  at 06/12/24 1241   Chlorhexidine  Gluconate Cloth 2 % PADS 6 each, 6 each, Topical, Daily, Paola Dreama SAILOR, MD, 6 each at 06/12/24 0926   cholecalciferol  (VITAMIN D3) 25 MCG (1000 UNIT) tablet 5,000 Units, 5,000 Units, Oral, Daily, Signe Cree A, MD, 5,000 Units at 06/12/24  9074   diphenhydrAMINE  (BENADRYL ) capsule 25 mg, 25 mg, Oral, Q6H PRN, Paola Dreama SAILOR, MD, 25 mg at 06/10/24 2210   docusate sodium  (COLACE) capsule 100 mg, 100 mg, Oral, BID, Paola Dreama SAILOR, MD, 100 mg at 06/12/24 9074   enoxaparin  (LOVENOX ) injection 30 mg, 30 mg, Subcutaneous, BID, Tammy Sor, PA-C, 30 mg at 06/12/24 9074   furosemide  (LASIX ) tablet 20 mg, 20 mg, Oral, Daily, Lovick, Dreama SAILOR, MD, 20 mg at 06/12/24 9074   gabapentin  (NEURONTIN ) capsule 100 mg, 100 mg, Oral, BID WC, Paola Dreama SAILOR, MD, 100 mg at 06/12/24 9074   hydrALAZINE  (APRESOLINE ) injection 10 mg, 10 mg,  Intravenous, Q2H PRN, Paola Dreama SAILOR, MD   HYDROcodone -acetaminophen  (NORCO/VICODIN) 5-325 MG per tablet 1 tablet, 1 tablet, Oral, Q4H PRN, Tomlinson, Sara Caylin, PA-C, 1 tablet at 06/09/24 9144   HYDROcodone -acetaminophen  (NORCO/VICODIN) 5-325 MG per tablet 2 tablet, 2 tablet, Oral, Q4H PRN, Tomlinson, Sara Caylin, PA-C, 2 tablet at 06/12/24 1239   levothyroxine  (SYNTHROID ) tablet 125 mcg, 125 mcg, Oral, QAC breakfast, Paola Dreama SAILOR, MD, 125 mcg at 06/12/24 0535   menthol -cetylpyridinium (CEPACOL) lozenge 3 mg, 1 lozenge, Oral, PRN **OR** phenol (CHLORASEPTIC) mouth spray 1 spray, 1 spray, Mouth/Throat, PRN, Johnanna Credit Caylin, PA-C   methocarbamol  (ROBAXIN ) tablet 750 mg, 750 mg, Oral, QID, 750 mg at 06/12/24 0925 **OR** [DISCONTINUED] methocarbamol  (ROBAXIN ) injection 500 mg, 500 mg, Intravenous, Q8H, Lovick, Dreama SAILOR, MD, 500 mg at 06/08/24 0900   metoprolol  tartrate (LOPRESSOR ) injection 5 mg, 5 mg, Intravenous, Q6H PRN, Paola Dreama SAILOR, MD   morphine  (PF) 2 MG/ML injection 1 mg, 1 mg, Intravenous, Q4H PRN, Tammy Sor, PA-C   multivitamin with minerals tablet 1 tablet, 1 tablet, Oral, Daily, Paola Dreama SAILOR, MD, 1 tablet at 06/12/24 9074   ondansetron  (ZOFRAN -ODT) disintegrating tablet 4 mg, 4 mg, Oral, Q6H PRN **OR** ondansetron  (ZOFRAN ) injection 4 mg, 4 mg, Intravenous, Q6H PRN, Paola Dreama SAILOR, MD, 4 mg at 06/09/24 0423   pantoprazole  (PROTONIX ) EC tablet 40 mg, 40 mg, Oral, BID, Tammy Sor, PA-C, 40 mg at 06/12/24 0925   polyethylene glycol (MIRALAX  / GLYCOLAX ) packet 17 g, 17 g, Oral, Daily, Tammy Sor, PA-C, 17 g at 06/12/24 9074   sodium chloride  flush (NS) 0.9 % injection 3 mL, 3 mL, Intravenous, Q12H, Johnanna Credit Caylin, PA-C, 3 mL at 06/12/24 9073   sodium chloride  flush (NS) 0.9 % injection 3 mL, 3 mL, Intravenous, PRN, Johnanna Credit Caylin, PA-C   spironolactone  (ALDACTONE ) tablet 25 mg, 25 mg, Oral, Daily, Lovick, Dreama SAILOR, MD, 25 mg at 06/12/24 9074   triamcinolone  cream (KENALOG ) 0.1 % cream 1 Application, 1 Application, Topical, BID PRN, Paola Dreama SAILOR, MD, 1 Application at 06/09/24 2110   zolpidem  (AMBIEN ) tablet 5 mg, 5 mg, Oral, QHS PRN, Paola Dreama SAILOR, MD  Patients Current Diet:  Diet Order             Diet regular Fluid consistency: Thin  Diet effective now                   Precautions / Restrictions Precautions Precautions: Back Precaution Booklet Issued: Yes (comment) Precaution/Restrictions Comments: brace donned seated; no OOB without brace; may remove to shower Spinal Brace: Thoracolumbosacral orthotic, Applied in sitting position Restrictions Weight Bearing Restrictions Per Provider Order: Yes LLE Weight Bearing Per Provider Order: Touchdown weight bearing   Has the patient had 2 or more falls or a fall with injury in the past year?Yes  Prior Activity Level Limited Community (1-2x/wk): Pt. went out for appts  Prior Functional Level Prior Function Prior Level of Function : Independent/Modified Independent, Needs assist Mobility Comments: ambulates with SPC ADLs Comments: does not drive, recent assist with medication management due to difficulty reading labels  Self Care: Did the patient need help bathing, dressing, using the toilet or eating?  Independent  Indoor Mobility: Did the patient need assistance with  walking from room to room (with or without device)? Independent  Stairs: Did the patient need assistance with internal or external stairs (with or without device)? Needed some help  Functional Cognition: Did the patient need help planning regular tasks  such as shopping or remembering to take medications? Independent  Patient Information Are you of Hispanic, Latino/a,or Spanish origin?: A. No, not of Hispanic, Latino/a, or Spanish origin What is your race?: A. White Do you need or want an interpreter to communicate with a doctor or health care staff?: 1. Yes  Patient's Response To:  Health Literacy and Transportation Is the patient able to respond to health literacy and transportation needs?: Yes Health Literacy - How often do you need to have someone help you when you read instructions, pamphlets, or other written material from your doctor or pharmacy?: Never In the past 12 months, has lack of transportation kept you from medical appointments or from getting medications?: No In the past 12 months, has lack of transportation kept you from meetings, work, or from getting things needed for daily living?: No  Home Assistive Devices / Equipment Home Equipment: Agricultural consultant (2 wheels), [employer] - single point  Prior Device Use: Indicate devices/aids used by the patient prior to current illness, exacerbation or injury? SPC  Current Functional Level Cognition  Orientation Level: Oriented X4    Extremity Assessment (includes Sensation/Coordination)  Upper Extremity Assessment: Generalized weakness  Lower Extremity Assessment: Defer to PT evaluation LLE Deficits / Details: +edema, bruising; AAROM limited in supine; seated able to bend hip and knee to 90 with encouragement LLE Sensation: history of peripheral neuropathy    ADLs  Overall ADL's : Needs assistance/impaired Eating/Feeding: Set up, Sitting Grooming: Set up, Sitting Upper Body Bathing: Set up, Sitting Lower Body Bathing: Maximal  assistance, Sit to/from stand Upper Body Dressing : Moderate assistance Upper Body Dressing Details (indicate cue type and reason): donning TLSO Lower Body Dressing: Maximal assistance, Sit to/from stand Toilet Transfer: +2 for physical assistance, Stand-pivot, BSC/3in1, Minimal assistance Toilet Transfer Details (indicate cue type and reason): simulated bed > chair Toileting- Clothing Manipulation and Hygiene: Moderate assistance, Sit to/from stand Functional mobility during ADLs: +2 for physical assistance, Minimal assistance    Mobility  Overal bed mobility: Needs Assistance Bed Mobility: Rolling, Sidelying to Sit Rolling: Min assist Sidelying to sit: Min assist, Used rails, HOB elevated General bed mobility comments: assist to bend LLE prior to rolling to her rt; Once sidelying, min A to initiate rise progressing to CGA; intermittent assist to scoot L hip toward EOB    Transfers  Overall transfer level: Needs assistance Equipment used: Rolling walker (2 wheels) Transfers: Sit to/from Stand Sit to Stand: Min assist Bed to/from chair/wheelchair/BSC transfer type:: Step pivot Stand pivot transfers: Min assist, +2 safety/equipment General transfer comment: vc for correct hand placement x 2 reps; steadying assist as transitioning hands from furniture up to RW    Ambulation / Gait / Stairs / Wheelchair Mobility  Ambulation/Gait Ambulation/Gait assistance: Min assist, +2 safety/equipment Gait Distance (Feet): 10 Feet Assistive device: Rolling walker (2 wheels) Gait Pattern/deviations: Step-to pattern General Gait Details: able to use UEs to support her weight and advance RLE while maintaining TDWB LLE; limited by pain in chest/ribs when weight-bearing through arms    Posture / Balance Dynamic Sitting Balance Sitting balance - Comments: statically Balance Overall balance assessment: Needs assistance Sitting-balance support: No upper extremity supported Sitting balance-Leahy Scale:  Good Sitting balance - Comments: statically Standing balance support: Bilateral upper extremity supported Standing balance-Leahy Scale: Poor Standing balance comment: reliant on device and external assist intermittently    Special needs/care consideration Skin surgical incision and Special service needs spinal precautions, TDWB on LLE     Previous Home Environment (from  acute therapy documentation) Living Arrangements: Spouse/significant other Available Help at Discharge: Family, Available 24 hours/day Type of Home: House Home Layout: One level Home Access: Stairs to enter Entrance Stairs-Rails: None Entrance Stairs-Number of Steps: 1 Bathroom Shower/Tub: Engineer, manufacturing systems: Standard Bathroom Accessibility: Yes How Accessible: Accessible via walker Home Care Services: No  Discharge Living Setting Plans for Discharge Living Setting: Patient's home Type of Home at Discharge: House Discharge Home Layout: One level Discharge Home Access: Stairs to enter Entrance Stairs-Rails: None Entrance Stairs-Number of Steps: 1 Discharge Bathroom Shower/Tub: Tub/shower unit Discharge Bathroom Toilet: Standard Discharge Bathroom Accessibility: Yes How Accessible: Accessible via walker Does the patient have any problems obtaining your medications?: No  Social/Family/Support Systems Patient Roles: Spouse Contact Information: [phone] Anticipated Caregiver: Christopher Caregiver Availability: 24/7 Discharge Plan Discussed with Primary Caregiver: Yes Is Caregiver In Agreement with Plan?: Yes Does Caregiver/Family have Issues with Lodging/Transportation while Pt is in Rehab?: No   Goals Patient/Family Goal for Rehab: PT/OT Supervision Expected length of stay: 7-10 days Pt/Family Agrees to Admission and willing to participate: Yes Program Orientation Provided & Reviewed with Pt/Caregiver Including Roles  & Responsibilities: Yes   Decrease burden of Care through IP rehab  admission: Not anticipated   Possible need for SNF placement upon discharge:Not anticipated   Patient Condition: This patient's medical and functional status has changed since the consult dated: 06/11/24 in which the Rehabilitation Physician determined and documented that the patient's condition is appropriate for intensive rehabilitative care in an inpatient rehabilitation facility. See History of Present Illness (above) for medical update. Functional changes are: Pt. CGA with mobility. Patient's medical and functional status update has been discussed with the Rehabilitation physician and patient remains appropriate for inpatient rehabilitation. Will admit to inpatient rehab today.  Preadmission Screen Completed By:  Leita KATHEE Kleine, CCC-SLP, 06/12/2024 3:15 PM ______________________________________________________________________   Discussed status with Dr. Carilyn on 06/18/24 at 955 and received approval for admission today.  Admission Coordinator:  Leita KATHEE Kleine, time 955/Date 06/18/24

## 2024-06-12 NOTE — Progress Notes (Signed)
 Trauma/Critical Care Follow Up Note  Subjective:    Overnight Issues:  having some back pain today still.  No new pelvic pain.  Pain in leg still present and stable.  Eating is ok, but not great.  Had 2 large BMs.  Objective:  Vital signs for last 24 hours: Temp:  [97.8 F (36.6 C)-98.4 F (36.9 C)] 98.2 F (36.8 C) (07/22 0730) Pulse Rate:  [74-80] 76 (07/22 0730) Resp:  [12-18] 18 (07/22 0730) BP: (103-118)/(57-64) 118/62 (07/22 0730) SpO2:  [95 %-99 %] 97 % (07/22 0730)  Intake/Output from previous day: 07/21 0701 - 07/22 0700 In: 6 [I.V.:6] Out: 3150 [Urine:3150]  Intake/Output this shift: Total I/O In: -  Out: 400 [Urine:400]  Physical Exam:  Gen: comfortable, no distress Neuro: follows commands, alert, communicative HEENT: PERRL Neck: supple CV: RRR Pulm: unlabored breathing on RA Abd: soft, NT  ,   Extr: wwp, no edema, L thigh dressing clean and dry, compartments soft  Results for orders placed or performed during the hospital encounter of 06/08/24 (from the past 24 hours)  Glucose, capillary     Status: None   Collection Time: 06/11/24 11:57 AM  Result Value Ref Range   Glucose-Capillary 84 70 - 99 mg/dL  Glucose, capillary     Status: Abnormal   Collection Time: 06/11/24  3:44 PM  Result Value Ref Range   Glucose-Capillary 133 (H) 70 - 99 mg/dL  Glucose, capillary     Status: Abnormal   Collection Time: 06/11/24  7:49 PM  Result Value Ref Range   Glucose-Capillary 151 (H) 70 - 99 mg/dL  Glucose, capillary     Status: None   Collection Time: 06/11/24 11:14 PM  Result Value Ref Range   Glucose-Capillary 91 70 - 99 mg/dL   Comment 1 Notify RN    Comment 2 Document in Chart   Glucose, capillary     Status: None   Collection Time: 06/12/24  3:53 AM  Result Value Ref Range   Glucose-Capillary 86 70 - 99 mg/dL   Comment 1 Notify RN    Comment 2 Document in Chart   CBC     Status: Abnormal   Collection Time: 06/12/24  6:12 AM  Result Value Ref Range    WBC 4.0 4.0 - 10.5 K/uL   RBC 2.65 (L) 3.87 - 5.11 MIL/uL   Hemoglobin 8.9 (L) 12.0 - 15.0 g/dL   HCT 73.6 (L) 63.9 - 53.9 %   MCV 99.2 80.0 - 100.0 fL   MCH 33.6 26.0 - 34.0 pg   MCHC 33.8 30.0 - 36.0 g/dL   RDW 85.2 88.4 - 84.4 %   Platelets 90 (L) 150 - 400 K/uL   nRBC 0.0 0.0 - 0.2 %  Glucose, capillary     Status: None   Collection Time: 06/12/24  7:27 AM  Result Value Ref Range   Glucose-Capillary 97 70 - 99 mg/dL    Assessment & Plan: The plan of care was discussed with the bedside nurse for the day, who is in agreement with this plan and no additional concerns were raised.   Present on Admission:  Thoracic spine fracture (HCC)    LOS: 4 days   Additional comments:I reviewed the patient's new clinical lab test results.   and I reviewed the patients new imaging test results.    GLF 7/18 L femur fx - s/p IMN ([facility])- TDWB LLE Presacral hematoma with possible occult sacral fx - Ortho consulted. Trend hgb- see below  T11-T12 fx - s/p T11-L1 posterior percutaneous instrumentation Cathy). TLSO brace when out of bed Neck pain - CT neg for fx, MRI with degenerative disc disease at C6-7 with mild-to-moderate central spinal canal stenosis and mild left neural foraminal stenosis, no acute injury L elbow pain - xray neg R shoulder pain - xray neg R knee pain - xray neg ABL anemia - hgb down to 8.9 from 11 after initiation of lovenox  yesterday.  Recheck to assure stability.  No increase in pain in her pelvis, back, or extremities. Hx Osteoporosis Hx CAD Hx CHF Hx COPD Hx Cirrhosis Hx Fibromyalgia Hx HTN - reports resolved since she had her duodenal switch Hx HLD -  reports resolved since she had her duodenal switch Hx Hypothyroidism Hx OSA -  reports no longer on CPAP since she had her duodenal switch Hx Pulm HTN Hx Seizures Hx CVA   Hx duodenal switch - avoid NSAIDs, multi. CBG monitoring given she reports episodes of hypoglycemia since surgery, but sugars look  good. FEN - regular diet VTE - SCDs, Lovenox , but monitor cbc ID - None currently.  Foley - foley out 7/19 Dispo - 4NP, therapies rec CIR, auth pending   Burnard FORBES Banter, PA-C  Trauma & General Surgery Please use [email or web address] to contact on call provider  06/12/2024  *Care during the described time interval was provided by me. I have reviewed this patient's available data, including medical history, events of note, physical examination and test results as part of my evaluation.

## 2024-06-12 NOTE — Plan of Care (Signed)
 Problem: Education: Goal: Knowledge of General Education information will improve Description: Including pain rating scale, medication(s)/side effects and non-pharmacologic comfort measures 06/12/2024 0351 by Marvis Kenneth SAILOR, RN Outcome: Progressing 06/12/2024 0204 by Marvis Kenneth SAILOR, RN Outcome: Progressing 06/11/2024 2121 by Marvis Kenneth SAILOR, RN Outcome: Progressing 06/11/2024 2107 by Marvis Kenneth SAILOR, RN Outcome: Progressing   Problem: Health Behavior/Discharge Planning: Goal: Ability to manage health-related needs will improve 06/12/2024 0351 by Marvis Kenneth SAILOR, RN Outcome: Progressing 06/12/2024 0204 by Marvis Kenneth SAILOR, RN Outcome: Progressing 06/11/2024 2121 by Marvis Kenneth SAILOR, RN Outcome: Progressing 06/11/2024 2107 by Marvis Kenneth SAILOR, RN Outcome: Progressing   Problem: Clinical Measurements: Goal: Ability to maintain clinical measurements within normal limits will improve 06/12/2024 0351 by Marvis Kenneth SAILOR, RN Outcome: Progressing 06/12/2024 0204 by Marvis Kenneth SAILOR, RN Outcome: Progressing 06/11/2024 2121 by Marvis Kenneth SAILOR, RN Outcome: Progressing 06/11/2024 2107 by Marvis Kenneth SAILOR, RN Outcome: Progressing Goal: Will remain free from infection 06/12/2024 0351 by Marvis Kenneth SAILOR, RN Outcome: Progressing 06/12/2024 0204 by Marvis Kenneth SAILOR, RN Outcome: Progressing 06/11/2024 2121 by Marvis Kenneth SAILOR, RN Outcome: Progressing 06/11/2024 2107 by Marvis Kenneth SAILOR, RN Outcome: Progressing Goal: Diagnostic test results will improve 06/12/2024 0351 by Marvis Kenneth SAILOR, RN Outcome: Progressing 06/12/2024 0204 by Marvis Kenneth SAILOR, RN Outcome: Progressing 06/11/2024 2121 by Marvis Kenneth SAILOR, RN Outcome: Progressing 06/11/2024 2107 by Marvis Kenneth SAILOR, RN Outcome: Progressing Goal: Respiratory complications will improve 06/12/2024 0351 by Marvis Kenneth SAILOR, RN Outcome: Progressing 06/12/2024 0204 by Marvis Kenneth SAILOR, RN Outcome:  Progressing 06/11/2024 2121 by Marvis Kenneth SAILOR, RN Outcome: Progressing 06/11/2024 2107 by Marvis Kenneth SAILOR, RN Outcome: Progressing Goal: Cardiovascular complication will be avoided 06/12/2024 0351 by Marvis Kenneth SAILOR, RN Outcome: Progressing 06/12/2024 0204 by Marvis Kenneth SAILOR, RN Outcome: Progressing 06/11/2024 2121 by Marvis Kenneth SAILOR, RN Outcome: Progressing 06/11/2024 2107 by Marvis Kenneth SAILOR, RN Outcome: Progressing   Problem: Activity: Goal: Risk for activity intolerance will decrease 06/12/2024 0351 by Marvis Kenneth SAILOR, RN Outcome: Progressing 06/12/2024 0204 by Marvis Kenneth SAILOR, RN Outcome: Progressing 06/11/2024 2121 by Marvis Kenneth SAILOR, RN Outcome: Progressing 06/11/2024 2107 by Marvis Kenneth SAILOR, RN Outcome: Progressing   Problem: Nutrition: Goal: Adequate nutrition will be maintained 06/12/2024 0351 by Marvis Kenneth SAILOR, RN Outcome: Progressing 06/12/2024 0204 by Marvis Kenneth SAILOR, RN Outcome: Progressing 06/11/2024 2121 by Marvis Kenneth SAILOR, RN Outcome: Progressing 06/11/2024 2107 by Marvis Kenneth SAILOR, RN Outcome: Progressing   Problem: Coping: Goal: Level of anxiety will decrease 06/12/2024 0351 by Marvis Kenneth SAILOR, RN Outcome: Progressing 06/12/2024 0204 by Marvis Kenneth SAILOR, RN Outcome: Progressing 06/11/2024 2121 by Marvis Kenneth SAILOR, RN Outcome: Progressing 06/11/2024 2107 by Marvis Kenneth SAILOR, RN Outcome: Progressing   Problem: Elimination: Goal: Will not experience complications related to bowel motility 06/12/2024 0351 by Marvis Kenneth SAILOR, RN Outcome: Progressing 06/12/2024 0204 by Marvis Kenneth SAILOR, RN Outcome: Progressing 06/11/2024 2121 by Marvis Kenneth SAILOR, RN Outcome: Progressing 06/11/2024 2107 by Marvis Kenneth SAILOR, RN Outcome: Progressing Goal: Will not experience complications related to urinary retention 06/12/2024 0351 by Marvis Kenneth SAILOR, RN Outcome: Progressing 06/12/2024 0204 by Marvis Kenneth SAILOR,  RN Outcome: Progressing 06/11/2024 2121 by Marvis Kenneth SAILOR, RN Outcome: Progressing 06/11/2024 2107 by Marvis Kenneth SAILOR, RN Outcome: Progressing   Problem: Pain Managment: Goal: General experience of comfort will improve and/or be controlled 06/12/2024 0351 by Marvis Kenneth SAILOR, RN Outcome: Progressing 06/12/2024 0204 by Marvis Kenneth SAILOR, RN Outcome: Progressing 06/11/2024 2121 by Marvis Kenneth SAILOR,  RN Outcome: Progressing 06/11/2024 2107 by Marvis Kenneth SAILOR, RN Outcome: Progressing   Problem: Safety: Goal: Ability to remain free from injury will improve 06/12/2024 0351 by Marvis Kenneth SAILOR, RN Outcome: Progressing 06/12/2024 0204 by Marvis Kenneth SAILOR, RN Outcome: Progressing 06/11/2024 2121 by Marvis Kenneth SAILOR, RN Outcome: Progressing 06/11/2024 2107 by Marvis Kenneth SAILOR, RN Outcome: Progressing   Problem: Skin Integrity: Goal: Risk for impaired skin integrity will decrease 06/12/2024 0351 by Marvis Kenneth SAILOR, RN Outcome: Progressing 06/12/2024 0204 by Marvis Kenneth SAILOR, RN Outcome: Progressing 06/11/2024 2121 by Marvis Kenneth SAILOR, RN Outcome: Progressing 06/11/2024 2107 by Marvis Kenneth SAILOR, RN Outcome: Progressing   Problem: Education: Goal: Ability to verbalize activity precautions or restrictions will improve 06/12/2024 0351 by Marvis Kenneth SAILOR, RN Outcome: Progressing 06/12/2024 0204 by Marvis Kenneth SAILOR, RN Outcome: Progressing 06/11/2024 2121 by Marvis Kenneth SAILOR, RN Outcome: Progressing 06/11/2024 2107 by Marvis Kenneth SAILOR, RN Outcome: Progressing Goal: Knowledge of the prescribed therapeutic regimen will improve 06/12/2024 0351 by Marvis Kenneth SAILOR, RN Outcome: Progressing 06/12/2024 0204 by Marvis Kenneth SAILOR, RN Outcome: Progressing 06/11/2024 2121 by Marvis Kenneth SAILOR, RN Outcome: Progressing 06/11/2024 2107 by Marvis Kenneth SAILOR, RN Outcome: Progressing Goal: Understanding of discharge needs will improve 06/12/2024 0351 by Marvis Kenneth SAILOR, RN Outcome: Progressing 06/12/2024 0204 by Marvis Kenneth SAILOR, RN Outcome: Progressing 06/11/2024 2121 by Marvis Kenneth SAILOR, RN Outcome: Progressing 06/11/2024 2107 by Marvis Kenneth SAILOR, RN Outcome: Progressing   Problem: Activity: Goal: Ability to avoid complications of mobility impairment will improve 06/12/2024 0351 by Marvis Kenneth SAILOR, RN Outcome: Progressing 06/12/2024 0204 by Marvis Kenneth SAILOR, RN Outcome: Progressing 06/11/2024 2121 by Marvis Kenneth SAILOR, RN Outcome: Progressing 06/11/2024 2107 by Marvis Kenneth SAILOR, RN Outcome: Progressing Goal: Ability to tolerate increased activity will improve 06/12/2024 0351 by Marvis Kenneth SAILOR, RN Outcome: Progressing 06/12/2024 0204 by Marvis Kenneth SAILOR, RN Outcome: Progressing 06/11/2024 2121 by Marvis Kenneth SAILOR, RN Outcome: Progressing 06/11/2024 2107 by Marvis Kenneth SAILOR, RN Outcome: Progressing Goal: Will remain free from falls 06/12/2024 0351 by Marvis Kenneth SAILOR, RN Outcome: Progressing 06/12/2024 0204 by Marvis Kenneth SAILOR, RN Outcome: Progressing 06/11/2024 2121 by Marvis Kenneth SAILOR, RN Outcome: Progressing 06/11/2024 2107 by Marvis Kenneth SAILOR, RN Outcome: Progressing   Problem: Bowel/Gastric: Goal: Gastrointestinal status for postoperative course will improve 06/12/2024 0351 by Marvis Kenneth SAILOR, RN Outcome: Progressing 06/12/2024 0204 by Marvis Kenneth SAILOR, RN Outcome: Progressing 06/11/2024 2121 by Marvis Kenneth SAILOR, RN Outcome: Progressing 06/11/2024 2107 by Marvis Kenneth SAILOR, RN Outcome: Progressing   Problem: Clinical Measurements: Goal: Ability to maintain clinical measurements within normal limits will improve 06/12/2024 0351 by Marvis Kenneth SAILOR, RN Outcome: Progressing 06/12/2024 0204 by Marvis Kenneth SAILOR, RN Outcome: Progressing 06/11/2024 2121 by Marvis Kenneth SAILOR, RN Outcome: Progressing 06/11/2024 2107 by Marvis Kenneth SAILOR, RN Outcome: Progressing Goal: Postoperative complications  will be avoided or minimized 06/12/2024 0351 by Marvis Kenneth SAILOR, RN Outcome: Progressing 06/12/2024 0204 by Marvis Kenneth SAILOR, RN Outcome: Progressing 06/11/2024 2121 by Marvis Kenneth SAILOR, RN Outcome: Progressing 06/11/2024 2107 by Marvis Kenneth SAILOR, RN Outcome: Progressing Goal: Diagnostic test results will improve 06/12/2024 0351 by Marvis Kenneth SAILOR, RN Outcome: Progressing 06/12/2024 0204 by Marvis Kenneth SAILOR, RN Outcome: Progressing 06/11/2024 2121 by Marvis Kenneth SAILOR, RN Outcome: Progressing 06/11/2024 2107 by Marvis Kenneth SAILOR, RN Outcome: Progressing   Problem: Pain Management: Goal: Pain level will decrease 06/12/2024 0351 by Marvis Kenneth SAILOR, RN Outcome: Progressing 06/12/2024 0204 by Marvis Kenneth SAILOR, RN Outcome: Progressing  06/11/2024 2121 by Marvis Kenneth SAILOR, RN Outcome: Progressing 06/11/2024 2107 by Marvis Kenneth SAILOR, RN Outcome: Progressing   Problem: Skin Integrity: Goal: Will show signs of wound healing 06/12/2024 0351 by Marvis Kenneth SAILOR, RN Outcome: Progressing 06/12/2024 0204 by Marvis Kenneth SAILOR, RN Outcome: Progressing 06/11/2024 2121 by Marvis Kenneth SAILOR, RN Outcome: Progressing 06/11/2024 2107 by Marvis Kenneth SAILOR, RN Outcome: Progressing   Problem: Health Behavior/Discharge Planning: Goal: Identification of resources available to assist in meeting health care needs will improve 06/12/2024 0351 by Marvis Kenneth SAILOR, RN Outcome: Progressing 06/12/2024 0204 by Marvis Kenneth SAILOR, RN Outcome: Progressing 06/11/2024 2121 by Marvis Kenneth SAILOR, RN Outcome: Progressing 06/11/2024 2107 by Marvis Kenneth SAILOR, RN Outcome: Progressing   Problem: Bladder/Genitourinary: Goal: Urinary functional status for postoperative course will improve 06/12/2024 0351 by Marvis Kenneth SAILOR, RN Outcome: Progressing 06/12/2024 0204 by Marvis Kenneth SAILOR, RN Outcome: Progressing 06/11/2024 2121 by Marvis Kenneth SAILOR, RN Outcome: Progressing 06/11/2024 2107 by  Marvis Kenneth SAILOR, RN Outcome: Progressing

## 2024-06-12 NOTE — Progress Notes (Signed)
 Occupational Therapy Treatment Patient Details Name: Kimberly [name] MRN: [id number] DOB: November 07, 1956 Today's Date: 06/12/2024   History of present illness 68 y.o. female who presented 06/08/24 via EMS after a fall. Cervical MRI negative; T11/12 multi column Chance type fracture (7/18 ORIF T11-L1); L femur fx (7/18 ORIF) PMH significant for CAD, CHF, COPD, Cirrhosis, Fibromyalgia, HTN, HLD, Hypothyroidism, OSA, Pulm HTN, Seizures, CVA.   OT comments  Pt progressing toward goals, needing mod A for UB ADL and min A +2 for pivot transfer to chair with use of RW. Pt needing mod cues to adhere to TWB precautions during session, reiterated back precautions and brace wear. Pt min A for bed mobility with use of bed rails and log roll technique. Pt presenting with impairments listed below, will follow acutely. Patient will benefit from intensive inpatient follow-up therapy, >3 hours/day to maximize safety/ind with ADL/functional mobility.       If plan is discharge home, recommend the following:  A lot of help with bathing/dressing/bathroom;A lot of help with walking and/or transfers;Assistance with cooking/housework;Assist for transportation;Help with stairs or ramp for entrance   Equipment Recommendations  Other (comment) (defer)    Recommendations for Other Services Rehab consult    Precautions / Restrictions Precautions Precautions: Back Precaution Booklet Issued: Yes (comment) Recall of Precautions/Restrictions: Impaired Precaution/Restrictions Comments: brace donned seated; no OOB without brace; may remove to shower Required Braces or Orthoses: Spinal Brace Spinal Brace: Thoracolumbosacral orthotic;Applied in sitting position Restrictions Weight Bearing Restrictions Per Provider Order: Yes LLE Weight Bearing Per Provider Order: Touchdown weight bearing       Mobility Bed Mobility Overal bed mobility: Needs Assistance Bed Mobility: Rolling, Sidelying to Sit Rolling: Min  assist Sidelying to sit: Min assist, Used rails, HOB elevated            Transfers Overall transfer level: Needs assistance Equipment used: Rolling walker (2 wheels) Transfers: Sit to/from Stand Sit to Stand: Min assist, +2 physical assistance Stand pivot transfers: Min assist, +2 safety/equipment         General transfer comment: mod cues for WB precautions throughout     Balance Overall balance assessment: Needs assistance Sitting-balance support: No upper extremity supported Sitting balance-Leahy Scale: Good Sitting balance - Comments: statically   Standing balance support: Bilateral upper extremity supported Standing balance-Leahy Scale: Poor Standing balance comment: reliant on device and external assist intermittently                           ADL either performed or assessed with clinical judgement   ADL                   Upper Body Dressing : Moderate assistance Upper Body Dressing Details (indicate cue type and reason): donning TLSO     Toilet Transfer: +2 for physical assistance;Stand-pivot;BSC/3in1;Minimal assistance Toilet Transfer Details (indicate cue type and reason): simulated bed > chair         Functional mobility during ADLs: +2 for physical assistance;Minimal assistance      Extremity/Trunk Assessment Upper Extremity Assessment Upper Extremity Assessment: Generalized weakness   Lower Extremity Assessment Lower Extremity Assessment: Defer to PT evaluation        Vision   Vision Assessment?: No apparent visual deficits   Perception Perception Perception: Not tested   Praxis Praxis Praxis: Not tested   Communication Communication Communication: No apparent difficulties   Cognition Arousal: Alert Behavior During Therapy: WFL for tasks assessed/performed Cognition: Cognition impaired  Awareness: Intellectual awareness intact, Online awareness intact Memory impairment (select all impairments): Short-term  memory Attention impairment (select first level of impairment): Selective attention Executive functioning impairment (select all impairments): Sequencing OT - Cognition Comments: recalls 2/3 back prec correctly                 Following commands: Intact        Cueing   Cueing Techniques: Verbal cues  Exercises General Exercises - Lower Extremity Long Arc Quad: Both, AROM, 10 reps, Seated    Shoulder Instructions       General Comments VSS    Pertinent Vitals/ Pain       Pain Assessment Pain Assessment: Faces Pain Score: 3  Faces Pain Scale: Hurts little more Pain Location: back, LLE Pain Descriptors / Indicators: Aching, Sore Pain Intervention(s): Limited activity within patient's tolerance, Monitored during session, Repositioned  Home Living Family/patient expects to be discharged to:: Private residence Living Arrangements: Spouse/significant other Available Help at Discharge: Family;Available 24 hours/day Type of Home: House Home Access: Stairs to enter [employer] of Steps: 1 Entrance Stairs-Rails: None Home Layout: One level     Bathroom Shower/Tub: Chief Strategy Officer: Standard     Home Equipment: Agricultural consultant (2 wheels);Cane - single point          Prior Functioning/Environment              Frequency  Min 2X/week        Progress Toward Goals  OT Goals(current goals can now be found in the care plan section)  Progress towards OT goals: Progressing toward goals  Acute Rehab OT Goals Patient Stated Goal: none stated OT Goal Formulation: With patient Time For Goal Achievement: 06/23/24 Potential to Achieve Goals: Good ADL Goals Pt Will Perform Grooming: with contact guard assist;standing Pt Will Perform Lower Body Dressing: with contact guard assist;sit to/from stand Pt Will Transfer to Toilet: with contact guard assist;stand pivot transfer Additional ADL Goal #1: Pt will perform bed mobility with  supervision within precautions as a precursor to ADL Additional ADL Goal #2: Pt will don spinal brace with set-up A  Plan      Co-evaluation                 AM-PAC OT 6 Clicks Daily Activity     Outcome Measure   Help from another person eating meals?: A Little Help from another person taking care of personal grooming?: A Little Help from another person toileting, which includes using toliet, bedpan, or urinal?: A Lot Help from another person bathing (including washing, rinsing, drying)?: A Lot Help from another person to put on and taking off regular upper body clothing?: A Lot Help from another person to put on and taking off regular lower body clothing?: A Lot 6 Click Score: 14    End of Session Equipment Utilized During Treatment: Gait belt;Rolling walker (2 wheels);Back brace  OT Visit Diagnosis: Unsteadiness on feet (R26.81);Muscle weakness (generalized) (M62.81);Other symptoms and signs involving cognitive function;History of falling (Z91.81);Pain   Activity Tolerance Patient tolerated treatment well   Patient Left in chair;with call bell/phone within reach;with chair alarm set   Nurse Communication Mobility status        Time: 1135-1200 OT Time Calculation (min): 25 min  Charges: OT General Charges $OT Visit: 1 Visit OT Treatments $Self Care/Home Management : 8-22 mins $Therapeutic Activity: 8-22 mins  Romyn Boswell K, OTD, OTR/L SecureChat Preferred Acute Rehab [phone]   Laneta  K Koonce 06/12/2024, 12:31 PM

## 2024-06-12 NOTE — Plan of Care (Signed)
 Problem: Education: Goal: Knowledge of General Education information will improve Description: Including pain rating scale, medication(s)/side effects and non-pharmacologic comfort measures 06/12/2024 0204 by Marvis Kenneth SAILOR, RN Outcome: Progressing 06/11/2024 2121 by Marvis Kenneth SAILOR, RN Outcome: Progressing 06/11/2024 2107 by Marvis Kenneth SAILOR, RN Outcome: Progressing   Problem: Health Behavior/Discharge Planning: Goal: Ability to manage health-related needs will improve 06/12/2024 0204 by Marvis Kenneth SAILOR, RN Outcome: Progressing 06/11/2024 2121 by Marvis Kenneth SAILOR, RN Outcome: Progressing 06/11/2024 2107 by Marvis Kenneth SAILOR, RN Outcome: Progressing   Problem: Clinical Measurements: Goal: Ability to maintain clinical measurements within normal limits will improve 06/12/2024 0204 by Marvis Kenneth SAILOR, RN Outcome: Progressing 06/11/2024 2121 by Marvis Kenneth SAILOR, RN Outcome: Progressing 06/11/2024 2107 by Marvis Kenneth SAILOR, RN Outcome: Progressing Goal: Will remain free from infection 06/12/2024 0204 by Marvis Kenneth SAILOR, RN Outcome: Progressing 06/11/2024 2121 by Marvis Kenneth SAILOR, RN Outcome: Progressing 06/11/2024 2107 by Marvis Kenneth SAILOR, RN Outcome: Progressing Goal: Diagnostic test results will improve 06/12/2024 0204 by Marvis Kenneth SAILOR, RN Outcome: Progressing 06/11/2024 2121 by Marvis Kenneth SAILOR, RN Outcome: Progressing 06/11/2024 2107 by Marvis Kenneth SAILOR, RN Outcome: Progressing Goal: Respiratory complications will improve 06/12/2024 0204 by Marvis Kenneth SAILOR, RN Outcome: Progressing 06/11/2024 2121 by Marvis Kenneth SAILOR, RN Outcome: Progressing 06/11/2024 2107 by Marvis Kenneth SAILOR, RN Outcome: Progressing Goal: Cardiovascular complication will be avoided 06/12/2024 0204 by Marvis Kenneth SAILOR, RN Outcome: Progressing 06/11/2024 2121 by Marvis Kenneth SAILOR, RN Outcome: Progressing 06/11/2024 2107 by Marvis Kenneth SAILOR, RN Outcome: Progressing    Problem: Activity: Goal: Risk for activity intolerance will decrease 06/12/2024 0204 by Marvis Kenneth SAILOR, RN Outcome: Progressing 06/11/2024 2121 by Marvis Kenneth SAILOR, RN Outcome: Progressing 06/11/2024 2107 by Marvis Kenneth SAILOR, RN Outcome: Progressing   Problem: Nutrition: Goal: Adequate nutrition will be maintained 06/12/2024 0204 by Marvis Kenneth SAILOR, RN Outcome: Progressing 06/11/2024 2121 by Marvis Kenneth SAILOR, RN Outcome: Progressing 06/11/2024 2107 by Marvis Kenneth SAILOR, RN Outcome: Progressing   Problem: Coping: Goal: Level of anxiety will decrease 06/12/2024 0204 by Marvis Kenneth SAILOR, RN Outcome: Progressing 06/11/2024 2121 by Marvis Kenneth SAILOR, RN Outcome: Progressing 06/11/2024 2107 by Marvis Kenneth SAILOR, RN Outcome: Progressing   Problem: Elimination: Goal: Will not experience complications related to bowel motility 06/12/2024 0204 by Marvis Kenneth SAILOR, RN Outcome: Progressing 06/11/2024 2121 by Marvis Kenneth SAILOR, RN Outcome: Progressing 06/11/2024 2107 by Marvis Kenneth SAILOR, RN Outcome: Progressing Goal: Will not experience complications related to urinary retention 06/12/2024 0204 by Marvis Kenneth SAILOR, RN Outcome: Progressing 06/11/2024 2121 by Marvis Kenneth SAILOR, RN Outcome: Progressing 06/11/2024 2107 by Marvis Kenneth SAILOR, RN Outcome: Progressing   Problem: Pain Managment: Goal: General experience of comfort will improve and/or be controlled 06/12/2024 0204 by Marvis Kenneth SAILOR, RN Outcome: Progressing 06/11/2024 2121 by Marvis Kenneth SAILOR, RN Outcome: Progressing 06/11/2024 2107 by Marvis Kenneth SAILOR, RN Outcome: Progressing   Problem: Safety: Goal: Ability to remain free from injury will improve 06/12/2024 0204 by Marvis Kenneth SAILOR, RN Outcome: Progressing 06/11/2024 2121 by Marvis Kenneth SAILOR, RN Outcome: Progressing 06/11/2024 2107 by Marvis Kenneth SAILOR, RN Outcome: Progressing   Problem: Skin Integrity: Goal: Risk for impaired skin  integrity will decrease 06/12/2024 0204 by Marvis Kenneth SAILOR, RN Outcome: Progressing 06/11/2024 2121 by Marvis Kenneth SAILOR, RN Outcome: Progressing 06/11/2024 2107 by Marvis Kenneth SAILOR, RN Outcome: Progressing   Problem: Education: Goal: Ability to verbalize activity precautions or restrictions will improve 06/12/2024 0204 by Marvis Kenneth SAILOR, RN Outcome: Progressing 06/11/2024 2121  by Marvis Kenneth SAILOR, RN Outcome: Progressing 06/11/2024 2107 by Marvis Kenneth SAILOR, RN Outcome: Progressing Goal: Knowledge of the prescribed therapeutic regimen will improve 06/12/2024 0204 by Marvis Kenneth SAILOR, RN Outcome: Progressing 06/11/2024 2121 by Marvis Kenneth SAILOR, RN Outcome: Progressing 06/11/2024 2107 by Marvis Kenneth SAILOR, RN Outcome: Progressing Goal: Understanding of discharge needs will improve 06/12/2024 0204 by Marvis Kenneth SAILOR, RN Outcome: Progressing 06/11/2024 2121 by Marvis Kenneth SAILOR, RN Outcome: Progressing 06/11/2024 2107 by Marvis Kenneth SAILOR, RN Outcome: Progressing   Problem: Activity: Goal: Ability to avoid complications of mobility impairment will improve 06/12/2024 0204 by Marvis Kenneth SAILOR, RN Outcome: Progressing 06/11/2024 2121 by Marvis Kenneth SAILOR, RN Outcome: Progressing 06/11/2024 2107 by Marvis Kenneth SAILOR, RN Outcome: Progressing Goal: Ability to tolerate increased activity will improve 06/12/2024 0204 by Marvis Kenneth SAILOR, RN Outcome: Progressing 06/11/2024 2121 by Marvis Kenneth SAILOR, RN Outcome: Progressing 06/11/2024 2107 by Marvis Kenneth SAILOR, RN Outcome: Progressing Goal: Will remain free from falls 06/12/2024 0204 by Marvis Kenneth SAILOR, RN Outcome: Progressing 06/11/2024 2121 by Marvis Kenneth SAILOR, RN Outcome: Progressing 06/11/2024 2107 by Marvis Kenneth SAILOR, RN Outcome: Progressing   Problem: Bowel/Gastric: Goal: Gastrointestinal status for postoperative course will improve 06/12/2024 0204 by Marvis Kenneth SAILOR, RN Outcome:  Progressing 06/11/2024 2121 by Marvis Kenneth SAILOR, RN Outcome: Progressing 06/11/2024 2107 by Marvis Kenneth SAILOR, RN Outcome: Progressing   Problem: Clinical Measurements: Goal: Ability to maintain clinical measurements within normal limits will improve 06/12/2024 0204 by Marvis Kenneth SAILOR, RN Outcome: Progressing 06/11/2024 2121 by Marvis Kenneth SAILOR, RN Outcome: Progressing 06/11/2024 2107 by Marvis Kenneth SAILOR, RN Outcome: Progressing Goal: Postoperative complications will be avoided or minimized 06/12/2024 0204 by Marvis Kenneth SAILOR, RN Outcome: Progressing 06/11/2024 2121 by Marvis Kenneth SAILOR, RN Outcome: Progressing 06/11/2024 2107 by Marvis Kenneth SAILOR, RN Outcome: Progressing Goal: Diagnostic test results will improve 06/12/2024 0204 by Marvis Kenneth SAILOR, RN Outcome: Progressing 06/11/2024 2121 by Marvis Kenneth SAILOR, RN Outcome: Progressing 06/11/2024 2107 by Marvis Kenneth SAILOR, RN Outcome: Progressing   Problem: Pain Management: Goal: Pain level will decrease 06/12/2024 0204 by Marvis Kenneth SAILOR, RN Outcome: Progressing 06/11/2024 2121 by Marvis Kenneth SAILOR, RN Outcome: Progressing 06/11/2024 2107 by Marvis Kenneth SAILOR, RN Outcome: Progressing   Problem: Skin Integrity: Goal: Will show signs of wound healing 06/12/2024 0204 by Marvis Kenneth SAILOR, RN Outcome: Progressing 06/11/2024 2121 by Marvis Kenneth SAILOR, RN Outcome: Progressing 06/11/2024 2107 by Marvis Kenneth SAILOR, RN Outcome: Progressing   Problem: Health Behavior/Discharge Planning: Goal: Identification of resources available to assist in meeting health care needs will improve 06/12/2024 0204 by Marvis Kenneth SAILOR, RN Outcome: Progressing 06/11/2024 2121 by Marvis Kenneth SAILOR, RN Outcome: Progressing 06/11/2024 2107 by Marvis Kenneth SAILOR, RN Outcome: Progressing   Problem: Bladder/Genitourinary: Goal: Urinary functional status for postoperative course will improve 06/12/2024 0204 by Marvis Kenneth SAILOR,  RN Outcome: Progressing 06/11/2024 2121 by Marvis Kenneth SAILOR, RN Outcome: Progressing 06/11/2024 2107 by Marvis Kenneth SAILOR, RN Outcome: Progressing

## 2024-06-12 NOTE — Progress Notes (Addendum)
 Inpatient Rehab Admissions Coordinator:   CIR following. Case remains pending with her insurance. Her husband confirmed that he can provide 24/7 min A at d/c.   Leita Kleine, MS, CCC-SLP Rehab Admissions Coordinator  [phone] (celll) [phone] (office)

## 2024-06-13 ENCOUNTER — Encounter (HOSPITAL_COMMUNITY): Payer: Self-pay | Admitting: Orthopedic Surgery

## 2024-06-13 LAB — CBC
HCT: 28.5 % — ABNORMAL LOW (ref 36.0–46.0)
Hemoglobin: 9.5 g/dL — ABNORMAL LOW (ref 12.0–15.0)
MCH: 33.5 pg (ref 26.0–34.0)
MCHC: 33.3 g/dL (ref 30.0–36.0)
MCV: 100.4 fL — ABNORMAL HIGH (ref 80.0–100.0)
Platelets: 118 K/uL — ABNORMAL LOW (ref 150–400)
RBC: 2.84 MIL/uL — ABNORMAL LOW (ref 3.87–5.11)
RDW: 14.9 % (ref 11.5–15.5)
WBC: 3.9 K/uL — ABNORMAL LOW (ref 4.0–10.5)
nRBC: 0 % (ref 0.0–0.2)

## 2024-06-13 LAB — GLUCOSE, CAPILLARY
Glucose-Capillary: 112 mg/dL — ABNORMAL HIGH (ref 70–99)
Glucose-Capillary: 147 mg/dL — ABNORMAL HIGH (ref 70–99)
Glucose-Capillary: 114 mg/dL — ABNORMAL HIGH (ref 70–99)
Glucose-Capillary: 85 mg/dL (ref 70–99)
Glucose-Capillary: 142 mg/dL — ABNORMAL HIGH (ref 70–99)
Glucose-Capillary: 146 mg/dL — ABNORMAL HIGH (ref 70–99)
Glucose-Capillary: 150 mg/dL — ABNORMAL HIGH (ref 70–99)

## 2024-06-13 MED ORDER — MELATONIN 5 MG PO TABS
5.0000 mg | ORAL_TABLET | Freq: Every evening | ORAL | Status: DC | PRN
  Administered 2024-06-13 – 2024-06-15 (×3): 5 mg via ORAL
  Filled 2024-06-13 (×3): qty 1

## 2024-06-13 MED ORDER — ALUM & MAG HYDROXIDE-SIMETH 200-200-20 MG/5ML PO SUSP: 30.0000 mL | ORAL | Status: DC | PRN

## 2024-06-13 MED ORDER — CALCIUM CARBONATE ANTACID 500 MG PO CHEW
1.0000 | CHEWABLE_TABLET | Freq: Three times a day (TID) | ORAL | Status: DC | PRN
  Administered 2024-06-15: 200 mg via ORAL
  Filled 2024-06-13: qty 1

## 2024-06-13 NOTE — Plan of Care (Signed)
 Problem: Education: Goal: Knowledge of General Education information will improve Description: Including pain rating scale, medication(s)/side effects and non-pharmacologic comfort measures 06/13/2024 0023 by Marvis Kenneth SAILOR, RN Outcome: Progressing 06/12/2024 2048 by Marvis Kenneth SAILOR, RN Outcome: Progressing   Problem: Health Behavior/Discharge Planning: Goal: Ability to manage health-related needs will improve 06/13/2024 0023 by Marvis Kenneth SAILOR, RN Outcome: Progressing 06/12/2024 2048 by Marvis Kenneth SAILOR, RN Outcome: Progressing   Problem: Clinical Measurements: Goal: Ability to maintain clinical measurements within normal limits will improve 06/13/2024 0023 by Marvis Kenneth SAILOR, RN Outcome: Progressing 06/12/2024 2048 by Marvis Kenneth SAILOR, RN Outcome: Progressing Goal: Will remain free from infection 06/13/2024 0023 by Marvis Kenneth SAILOR, RN Outcome: Progressing 06/12/2024 2048 by Marvis Kenneth SAILOR, RN Outcome: Progressing Goal: Diagnostic test results will improve 06/13/2024 0023 by Marvis Kenneth SAILOR, RN Outcome: Progressing 06/12/2024 2048 by Marvis Kenneth SAILOR, RN Outcome: Progressing Goal: Respiratory complications will improve 06/13/2024 0023 by Marvis Kenneth SAILOR, RN Outcome: Progressing 06/12/2024 2048 by Marvis Kenneth SAILOR, RN Outcome: Progressing Goal: Cardiovascular complication will be avoided 06/13/2024 0023 by Marvis Kenneth SAILOR, RN Outcome: Progressing 06/12/2024 2048 by Marvis Kenneth SAILOR, RN Outcome: Progressing   Problem: Activity: Goal: Risk for activity intolerance will decrease 06/13/2024 0023 by Marvis Kenneth SAILOR, RN Outcome: Progressing 06/12/2024 2048 by Marvis Kenneth SAILOR, RN Outcome: Progressing   Problem: Nutrition: Goal: Adequate nutrition will be maintained 06/13/2024 0023 by Marvis Kenneth SAILOR, RN Outcome: Progressing 06/12/2024 2048 by Marvis Kenneth SAILOR, RN Outcome: Progressing   Problem: Coping: Goal: Level of anxiety will  decrease 06/13/2024 0023 by Marvis Kenneth SAILOR, RN Outcome: Progressing 06/12/2024 2048 by Marvis Kenneth SAILOR, RN Outcome: Progressing   Problem: Elimination: Goal: Will not experience complications related to bowel motility 06/13/2024 0023 by Marvis Kenneth SAILOR, RN Outcome: Progressing 06/12/2024 2048 by Marvis Kenneth SAILOR, RN Outcome: Progressing Goal: Will not experience complications related to urinary retention 06/13/2024 0023 by Marvis Kenneth SAILOR, RN Outcome: Progressing 06/12/2024 2048 by Marvis Kenneth SAILOR, RN Outcome: Progressing   Problem: Pain Managment: Goal: General experience of comfort will improve and/or be controlled 06/13/2024 0023 by Marvis Kenneth SAILOR, RN Outcome: Progressing 06/12/2024 2048 by Marvis Kenneth SAILOR, RN Outcome: Progressing   Problem: Safety: Goal: Ability to remain free from injury will improve 06/13/2024 0023 by Marvis Kenneth SAILOR, RN Outcome: Progressing 06/12/2024 2048 by Marvis Kenneth SAILOR, RN Outcome: Progressing   Problem: Skin Integrity: Goal: Risk for impaired skin integrity will decrease 06/13/2024 0023 by Marvis Kenneth SAILOR, RN Outcome: Progressing 06/12/2024 2048 by Marvis Kenneth SAILOR, RN Outcome: Progressing   Problem: Education: Goal: Ability to verbalize activity precautions or restrictions will improve 06/13/2024 0023 by Marvis Kenneth SAILOR, RN Outcome: Progressing 06/12/2024 2048 by Marvis Kenneth SAILOR, RN Outcome: Progressing Goal: Knowledge of the prescribed therapeutic regimen will improve 06/13/2024 0023 by Marvis Kenneth SAILOR, RN Outcome: Progressing 06/12/2024 2048 by Marvis Kenneth SAILOR, RN Outcome: Progressing Goal: Understanding of discharge needs will improve 06/13/2024 0023 by Marvis Kenneth SAILOR, RN Outcome: Progressing 06/12/2024 2048 by Marvis Kenneth SAILOR, RN Outcome: Progressing   Problem: Activity: Goal: Ability to avoid complications of mobility impairment will improve 06/13/2024 0023 by Marvis Kenneth SAILOR,  RN Outcome: Progressing 06/12/2024 2048 by Marvis Kenneth SAILOR, RN Outcome: Progressing Goal: Ability to tolerate increased activity will improve 06/13/2024 0023 by Marvis Kenneth SAILOR, RN Outcome: Progressing 06/12/2024 2048 by Marvis Kenneth SAILOR, RN Outcome: Progressing Goal: Will remain free from falls 06/13/2024 0023 by Marvis Kenneth SAILOR, RN Outcome: Progressing 06/12/2024 2048 by  Marvis Kenneth SAILOR, RN Outcome: Progressing   Problem: Bowel/Gastric: Goal: Gastrointestinal status for postoperative course will improve 06/13/2024 0023 by Marvis Kenneth SAILOR, RN Outcome: Progressing 06/12/2024 2048 by Marvis Kenneth SAILOR, RN Outcome: Progressing   Problem: Clinical Measurements: Goal: Ability to maintain clinical measurements within normal limits will improve 06/13/2024 0023 by Marvis Kenneth SAILOR, RN Outcome: Progressing 06/12/2024 2048 by Marvis Kenneth SAILOR, RN Outcome: Progressing Goal: Postoperative complications will be avoided or minimized 06/13/2024 0023 by Marvis Kenneth SAILOR, RN Outcome: Progressing 06/12/2024 2048 by Marvis Kenneth SAILOR, RN Outcome: Progressing Goal: Diagnostic test results will improve 06/13/2024 0023 by Marvis Kenneth SAILOR, RN Outcome: Progressing 06/12/2024 2048 by Marvis Kenneth SAILOR, RN Outcome: Progressing   Problem: Pain Management: Goal: Pain level will decrease 06/13/2024 0023 by Marvis Kenneth SAILOR, RN Outcome: Progressing 06/12/2024 2048 by Marvis Kenneth SAILOR, RN Outcome: Progressing   Problem: Skin Integrity: Goal: Will show signs of wound healing 06/13/2024 0023 by Marvis Kenneth SAILOR, RN Outcome: Progressing 06/12/2024 2048 by Marvis Kenneth SAILOR, RN Outcome: Progressing   Problem: Health Behavior/Discharge Planning: Goal: Identification of resources available to assist in meeting health care needs will improve 06/13/2024 0023 by Marvis Kenneth SAILOR, RN Outcome: Progressing 06/12/2024 2048 by Marvis Kenneth SAILOR, RN Outcome: Progressing   Problem:  Bladder/Genitourinary: Goal: Urinary functional status for postoperative course will improve 06/13/2024 0023 by Marvis Kenneth SAILOR, RN Outcome: Progressing 06/12/2024 2048 by Marvis Kenneth SAILOR, RN Outcome: Progressing

## 2024-06-13 NOTE — Progress Notes (Signed)
 Physical Therapy Treatment Patient Details Name: Kimberly [name] MRN: [id number] DOB: 01/10/1956 Today's Date: 06/13/2024   History of Present Illness 68 y.o. female who presented 06/08/24 via EMS after a fall. Cervical MRI negative; T11/12 multi column Chance type fracture (7/18 ORIF T11-L1); L femur fx (7/18 ORIF) PMH significant for CAD, CHF, COPD, Cirrhosis, Fibromyalgia, HTN, HLD, Hypothyroidism, OSA, Pulm HTN, Seizures, CVA.    PT Comments  Patient continues to make slow progress with mobility, requiring overall min assist. Able to progress to ambulation x 25 ft with pt maintaining LLE TDWB. She has very limited rt foot clearance and will have difficulty attempting stairs at this time. Continue to feel pt will benefit from inpatient post-acute therapies >3 hrs/day.     If plan is discharge home, recommend the following: A lot of help with bathing/dressing/bathroom;Assistance with cooking/housework;Assist for transportation;Help with stairs or ramp for entrance;A little help with walking and/or transfers   Can travel by private vehicle        Equipment Recommendations  None recommended by PT    Recommendations for Other Services       Precautions / Restrictions Precautions Precautions: Back Precaution Booklet Issued: Yes (comment) Recall of Precautions/Restrictions: Impaired Precaution/Restrictions Comments: brace donned seated; no OOB without brace; may remove to shower; pt able to state 2/3 precautions and no cues needed to adhere Required Braces or Orthoses: Spinal Brace Spinal Brace: Thoracolumbosacral orthotic;Applied in sitting position Restrictions Weight Bearing Restrictions Per Provider Order: Yes LLE Weight Bearing Per Provider Order: Touchdown weight bearing     Mobility  Bed Mobility Overal bed mobility: Needs Assistance Bed Mobility: Rolling, Sidelying to Sit Rolling: Min assist, Used rails Sidelying to sit: Min assist, Used rails, HOB elevated        General bed mobility comments: Once sidelying, min A to initiate rise progressing to CGA; no assist needed to scoot out to EOB    Transfers Overall transfer level: Needs assistance Equipment used: Rolling walker (2 wheels) Transfers: Sit to/from Stand Sit to Stand: Min assist           General transfer comment: questioning cue for correct hand placement ; steadying assist as transitioning hands from furniture up to RW    Ambulation/Gait Ambulation/Gait assistance: Min assist Gait Distance (Feet): 25 Feet Assistive device: Rolling walker (2 wheels) Gait Pattern/deviations: Step-to pattern   Gait velocity interpretation: <1.31 ft/sec, indicative of household ambulator   General Gait Details: able to use UEs to support her weight and advance RLE while maintaining TDWB LLE; limited by pain in chest/ribs when weight-bearing through arms   Stairs             Wheelchair Mobility     Tilt Bed    Modified Rankin (Stroke Patients Only)       Balance Overall balance assessment: Needs assistance Sitting-balance support: No upper extremity supported Sitting balance-Leahy Scale: Good Sitting balance - Comments: statically   Standing balance support: Bilateral upper extremity supported Standing balance-Leahy Scale: Poor Standing balance comment: reliant on device and external assist intermittently                            Communication Communication Communication: No apparent difficulties  Cognition Arousal: Alert Behavior During Therapy: WFL for tasks assessed/performed                             Following commands: Intact  Cueing Cueing Techniques: Verbal cues  Exercises General Exercises - Lower Extremity Ankle Circles/Pumps: AROM, Both, 10 reps Quad Sets: AROM, Left, 10 reps Heel Slides: AAROM, Both, 10 reps Hip ABduction/ADduction: AAROM, Left, 10 reps    General Comments        Pertinent Vitals/Pain Pain  Assessment Pain Assessment: Faces Faces Pain Scale: Hurts little more Pain Location: back, LLE Pain Descriptors / Indicators: Aching, Sore Pain Intervention(s): Limited activity within patient's tolerance, Monitored during session, Premedicated before session    Home Living                          Prior Function            PT Goals (current goals can now be found in the care plan section) Acute Rehab PT Goals Patient Stated Goal: avoid going to SNF PT Goal Formulation: With patient Time For Goal Achievement: 06/23/24 Potential to Achieve Goals: Good Progress towards PT goals: Progressing toward goals    Frequency    Min 3X/week      PT Plan      Co-evaluation              AM-PAC PT 6 Clicks Mobility   Outcome Measure  Help needed turning from your back to your side while in a flat bed without using bedrails?: A Little Help needed moving from lying on your back to sitting on the side of a flat bed without using bedrails?: A Little Help needed moving to and from a bed to a chair (including a wheelchair)?: A Little Help needed standing up from a chair using your arms (e.g., wheelchair or bedside chair)?: A Little Help needed to walk in hospital room?: A Little Help needed climbing 3-5 steps with a railing? : Total 6 Click Score: 16    End of Session Equipment Utilized During Treatment: Gait belt Activity Tolerance: Patient tolerated treatment well Patient left: with call bell/phone within reach;in chair   PT Visit Diagnosis: Other abnormalities of gait and mobility (R26.89);History of falling (Z91.81);Muscle weakness (generalized) (M62.81)     Time: 8997-8970 PT Time Calculation (min) (ACUTE ONLY): 27 min  Charges:    $Gait Training: 8-22 mins $Therapeutic Exercise: 8-22 mins PT General Charges $$ ACUTE PT VISIT: 1 Visit                      Macario RAMAN, PT Acute Rehabilitation Services  Office [phone]    Macario SHAUNNA Soja 06/13/2024, 10:44 AM

## 2024-06-13 NOTE — Progress Notes (Addendum)
 Trauma/Critical Care Follow Up Note  Subjective:    Overnight Issues:  had bad dreams with ambien  and gabapentin  over night.  Feels like she pulled a muscle in her chest yesterday trying to mobilize in the bed.  Objective:  Vital signs for last 24 hours: Temp:  [98.1 F (36.7 C)-99 F (37.2 C)] 98.2 F (36.8 C) (07/23 0759) Pulse Rate:  [75-81] 81 (07/23 0759) Resp:  [14-21] 14 (07/23 0759) BP: (106-114)/(53-72) 112/72 (07/23 0759) SpO2:  [97 %-98 %] 98 % (07/23 0759)  Intake/Output from previous day: 07/22 0701 - 07/23 0700 In: 123 [P.O.:120; I.V.:3] Out: 2300 [Urine:2300]  Intake/Output this shift: Total I/O In: 240 [P.O.:240] Out: 600 [Urine:600]  Physical Exam:  Gen: comfortable, no distress Neuro: follows commands, alert, communicative HEENT: PERRL Neck: supple CV: RRR Pulm: unlabored breathing on RA Abd: soft, NT  ,   Extr: wwp, no edema, L thigh dressing clean and dry, compartments soft  Results for orders placed or performed during the hospital encounter of 06/08/24 (from the past 24 hours)  Glucose, capillary     Status: None   Collection Time: 06/12/24 11:30 AM  Result Value Ref Range   Glucose-Capillary 83 70 - 99 mg/dL  CBC     Status: Abnormal   Collection Time: 06/12/24  2:08 PM  Result Value Ref Range   WBC 4.2 4.0 - 10.5 K/uL   RBC 2.86 (L) 3.87 - 5.11 MIL/uL   Hemoglobin 9.5 (L) 12.0 - 15.0 g/dL   HCT 71.2 (L) 63.9 - 53.9 %   MCV 100.3 (H) 80.0 - 100.0 fL   MCH 33.2 26.0 - 34.0 pg   MCHC 33.1 30.0 - 36.0 g/dL   RDW 85.2 88.4 - 84.4 %   Platelets 107 (L) 150 - 400 K/uL   nRBC 0.0 0.0 - 0.2 %  Glucose, capillary     Status: Abnormal   Collection Time: 06/12/24  3:35 PM  Result Value Ref Range   Glucose-Capillary 105 (H) 70 - 99 mg/dL  Glucose, capillary     Status: Abnormal   Collection Time: 06/12/24  7:59 PM  Result Value Ref Range   Glucose-Capillary 133 (H) 70 - 99 mg/dL  Glucose, capillary     Status: Abnormal   Collection Time:  06/12/24 11:07 PM  Result Value Ref Range   Glucose-Capillary 186 (H) 70 - 99 mg/dL  Glucose, capillary     Status: Abnormal   Collection Time: 06/13/24  3:13 AM  Result Value Ref Range   Glucose-Capillary 112 (H) 70 - 99 mg/dL  CBC     Status: Abnormal   Collection Time: 06/13/24  5:59 AM  Result Value Ref Range   WBC 3.9 (L) 4.0 - 10.5 K/uL   RBC 2.84 (L) 3.87 - 5.11 MIL/uL   Hemoglobin 9.5 (L) 12.0 - 15.0 g/dL   HCT 71.4 (L) 63.9 - 53.9 %   MCV 100.4 (H) 80.0 - 100.0 fL   MCH 33.5 26.0 - 34.0 pg   MCHC 33.3 30.0 - 36.0 g/dL   RDW 85.0 88.4 - 84.4 %   Platelets 118 (L) 150 - 400 K/uL   nRBC 0.0 0.0 - 0.2 %  Glucose, capillary     Status: Abnormal   Collection Time: 06/13/24  8:02 AM  Result Value Ref Range   Glucose-Capillary 147 (H) 70 - 99 mg/dL    Assessment & Plan: The plan of care was discussed with the bedside nurse for the day, who is in  agreement with this plan and no additional concerns were raised.   Present on Admission:  Thoracic spine fracture (HCC)    LOS: 5 days   Additional comments:I reviewed the patient's new clinical lab test results.   and I reviewed the patients new imaging test results.    GLF 7/18 L femur fx - s/p IMN ([facility])- TDWB LLE Presacral hematoma with possible occult sacral fx - Ortho consulted. Trend hgb- see below T11-T12 fx - s/p T11-L1 posterior percutaneous instrumentation Cathy). TLSO brace when out of bed Neck pain - CT neg for fx, MRI with degenerative disc disease at C6-7 with mild-to-moderate central spinal canal stenosis and mild left neural foraminal stenosis, no acute injury L elbow pain - xray neg R shoulder pain - xray neg R knee pain - xray neg ABL anemia - hgb stable at 9.5 Hx Osteoporosis Hx CAD Hx CHF Hx COPD Hx Cirrhosis Hx Fibromyalgia Hx HTN - reports resolved since she had her duodenal switch Hx HLD -  reports resolved since she had her duodenal switch Hx Hypothyroidism Hx OSA -  reports no longer on CPAP  since she had her duodenal switch Hx Pulm HTN Hx Seizures Hx CVA   Hx duodenal switch - avoid NSAIDs, multi. CBG monitoring given she reports episodes of hypoglycemia since surgery, but sugars look good. FEN - regular diet VTE - SCDs, Lovenox  ID - None currently.  Foley - foley out 7/19 Dispo - 4NP, therapies rec CIR, auth denied.  CIR to determine if patient wants to appeal   Burnard FORBES Banter, PA-C  Trauma & General Surgery Please use [email or web address] to contact on call provider  06/13/2024  *Care during the described time interval was provided by me. I have reviewed this patient's available data, including medical history, events of note, physical examination and test results as part of my evaluation.

## 2024-06-13 NOTE — Progress Notes (Signed)
 Inpatient Rehab Admissions Coordinator:    Case denied by insurance. Pt. Wishes to appeal. I will send appeal today.   Leita Kleine, MS, CCC-SLP Rehab Admissions Coordinator  [phone] (celll) [phone] (office)

## 2024-06-14 LAB — GLUCOSE, CAPILLARY
Glucose-Capillary: 87 mg/dL (ref 70–99)
Glucose-Capillary: 91 mg/dL (ref 70–99)
Glucose-Capillary: 105 mg/dL — ABNORMAL HIGH (ref 70–99)
Glucose-Capillary: 104 mg/dL — ABNORMAL HIGH (ref 70–99)
Glucose-Capillary: 132 mg/dL — ABNORMAL HIGH (ref 70–99)

## 2024-06-14 NOTE — Evaluation (Signed)
 Occupational Therapy Evaluation Patient Details Name: Kimberly [name] MRN: [id number] DOB: Apr 20, 1956 Today's Date: 06/14/2024   History of Present Illness   68 y.o. female who presented 06/08/24 via EMS after a fall. Cervical MRI negative; T11/12 multi column Chance type fracture (7/18 ORIF T11-L1); L femur fx (7/18 ORIF) PMH significant for CAD, CHF, COPD, Cirrhosis, Fibromyalgia, HTN, HLD, Hypothyroidism, OSA, Pulm HTN, Seizures, CVA.     Clinical Impressions Pt at this time completed bed mobility with CGA with HOB elevated and bed rail. Pt completed sit to stand transfers with min assist to RW and ambulated into the bathroom to complete toileting tasks with CGA. Pt then needed close CGA to min assist with peri care. Pt then when leaving the bathroom reported feeling not good, clammy like and required to sit in chair outside bathroom. BP 106/68 (81). Pt then agreed to sit OOB with nurse notified for pain medication and ice applied. Patient will benefit from intensive inpatient follow-up therapy, >3 hours/day. If pt does not qualify then would recommend SNF.      If plan is discharge home, recommend the following:   A lot of help with bathing/dressing/bathroom;A lot of help with walking and/or transfers;Assistance with cooking/housework;Assist for transportation;Help with stairs or ramp for entrance     Functional Status Assessment         Equipment Recommendations   Other (comment) (TBD)     Recommendations for Other Services         Precautions/Restrictions   Precautions Precautions: Back Recall of Precautions/Restrictions: Impaired Required Braces or Orthoses: Spinal Brace Spinal Brace: Thoracolumbosacral orthotic;Applied in sitting position Restrictions Weight Bearing Restrictions Per Provider Order: Yes LLE Weight Bearing Per Provider Order: Touchdown weight bearing     Mobility Bed Mobility Overal bed mobility: Needs Assistance Bed Mobility: Supine to  Sit Rolling: Contact guard assist              Transfers Overall transfer level: Needs assistance Equipment used: Rolling walker (2 wheels) Transfers: Sit to/from Stand Sit to Stand: Min assist                  Balance Overall balance assessment: Needs assistance Sitting-balance support: No upper extremity supported Sitting balance-Leahy Scale: Good     Standing balance support: Bilateral upper extremity supported Standing balance-Leahy Scale: Poor Standing balance comment: reliant on device but was able to complete some peri care and pulling up undergarments                           ADL either performed or assessed with clinical judgement   ADL Overall ADL's : Needs assistance/impaired Eating/Feeding: Set up;Sitting   Grooming: Set up;Sitting   Upper Body Bathing: Set up;Sitting   Lower Body Bathing: Maximal assistance;Sit to/from stand   Upper Body Dressing : Minimal assistance;Sitting   Lower Body Dressing: Maximal assistance;Sit to/from stand   Toilet Transfer: Contact guard assist;Rolling walker (2 wheels)   Toileting- Clothing Manipulation and Hygiene: Contact guard assist;Minimal assistance;Sit to/from stand       Functional mobility during ADLs: Contact guard assist;Rolling walker (2 wheels)       Vision   Vision Assessment?: No apparent visual deficits     Perception Perception: Not tested       Praxis Praxis: Not tested       Pertinent Vitals/Pain Pain Assessment Pain Assessment: Faces Faces Pain Scale: Hurts whole lot Pain Location: back, LLE Pain Descriptors / Indicators: Aching,  Sore Pain Intervention(s): Limited activity within patient's tolerance, Monitored during session, Repositioned, Ice applied     Extremity/Trunk Assessment Upper Extremity Assessment Upper Extremity Assessment: Generalized weakness   Lower Extremity Assessment Lower Extremity Assessment: Defer to PT evaluation       Communication  Communication Communication: No apparent difficulties   Cognition Arousal: Alert Behavior During Therapy: [facility] for tasks assessed/performed           Attention impairment (select first level of impairment): Divided attention Executive functioning impairment (select all impairments): Sequencing                   Following commands: Intact       Cueing  General Comments   Cueing Techniques: Verbal cues  VSS per monitor   Exercises     Shoulder Instructions      Home Living                                          Prior Functioning/Environment                      OT Problem List:     OT Treatment/Interventions:        OT Goals(Current goals can be found in the care plan section)   Acute Rehab OT Goals Patient Stated Goal: to get better OT Goal Formulation: With patient Time For Goal Achievement: 06/23/24 Potential to Achieve Goals: Good ADL Goals Pt Will Perform Grooming: with contact guard assist;standing Pt Will Perform Lower Body Dressing: with contact guard assist;sit to/from stand Pt Will Transfer to Toilet: with contact guard assist;stand pivot transfer Additional ADL Goal #1: Pt will perform bed mobility with supervision within precautions as a precursor to ADL Additional ADL Goal #2: Pt will don spinal brace with set-up A   OT Frequency:  Min 2X/week    Co-evaluation              AM-PAC OT 6 Clicks Daily Activity     Outcome Measure Help from another person eating meals?: A Little Help from another person taking care of personal grooming?: A Little Help from another person toileting, which includes using toliet, bedpan, or urinal?: A Lot Help from another person bathing (including washing, rinsing, drying)?: A Lot Help from another person to put on and taking off regular upper body clothing?: A Little Help from another person to put on and taking off regular lower body clothing?: A Lot 6 Click Score:  15   End of Session Equipment Utilized During Treatment: Gait belt;Rolling walker (2 wheels) Nurse Communication: Mobility status  Activity Tolerance: Patient limited by pain Patient left: in chair;with call bell/phone within reach;with chair alarm set  OT Visit Diagnosis: Unsteadiness on feet (R26.81);Muscle weakness (generalized) (M62.81);Other symptoms and signs involving cognitive function;History of falling (Z91.81);Pain Pain - Right/Left: Left Pain - part of body: Leg                Time: 9057-8964 OT Time Calculation (min): 53 min Charges:  OT General Charges $OT Visit: 1 Visit OT Treatments $Self Care/Home Management : 53-67 mins  Warrick POUR OTR/L  Acute Rehab Services  [phone] office number   Warrick Berber 06/14/2024, 11:38 AM

## 2024-06-14 NOTE — Plan of Care (Signed)

## 2024-06-14 NOTE — Progress Notes (Signed)
 Inpatient Rehab Admissions Coordinator:    I continue to await decision on expedited appeal. I do not anticipate a decision today.   Leita Kleine, MS, CCC-SLP Rehab Admissions Coordinator  [phone] (celll) [phone] (office)

## 2024-06-14 NOTE — TOC Progression Note (Signed)
 Transition of Care [facility]) - Progression Note    Patient Details  Name: Kimberly [name] MRN: [id number] Date of Birth: 03/19/1956  Transition of [facility]) CM/SW Contact  Carmelita FORBES Carbon, LCSW Phone Number: 06/14/2024, 11:44 AM  Clinical Narrative:    TOC continues to follow, awaiting CIR appeal decision.    Expected Discharge Plan: IP Rehab Facility Barriers to Discharge: Continued Medical Work up               Expected Discharge Plan and Services       Living arrangements for the past 2 months: Single Family Home                                       Social Drivers of Health (SDOH) Interventions SDOH Screenings   Food Insecurity: Food Insecurity Present (06/09/2024)  Housing: Low Risk  (06/09/2024)  Transportation Needs: No Transportation Needs (06/09/2024)  Utilities: Not At Risk (06/09/2024)  Depression (PHQ2-9): Low Risk  (04/13/2024)  Financial Resource Strain: Low Risk  (06/29/2023)  Recent Concern: Financial Resource Strain - Medium Risk (06/29/2023)  Social Connections: Moderately Integrated (06/09/2024)  Tobacco Use: Low Risk  (06/08/2024)  Health Literacy: Adequate Health Literacy (08/30/2023)    Readmission Risk Interventions    02/15/2023    9:21 AM  Readmission Risk Prevention Plan  Post Dischage Appt Complete  Medication Screening Complete  Transportation Screening Complete

## 2024-06-14 NOTE — Progress Notes (Signed)
 Trauma/Critical Care Follow Up Note  Subjective:    Overnight Issues:  no new complaints today.  Objective:  Vital signs for last 24 hours: Temp:  [98.2 F (36.8 C)-99.2 F (37.3 C)] 98.2 F (36.8 C) (07/24 0745) Pulse Rate:  [70-83] 70 (07/24 0745) Resp:  [13-19] 15 (07/24 0745) BP: (102-121)/(60-90) 116/90 (07/24 0745) SpO2:  [95 %-100 %] 97 % (07/24 0745)  Intake/Output from previous day: 07/23 0701 - 07/24 0700 In: 960 [P.O.:960] Out: 2250 [Urine:2250]  Intake/Output this shift: Total I/O In: -  Out: 600 [Urine:600]  Physical Exam:  Gen: comfortable, no distress Neuro: follows commands, alert, communicative HEENT: PERRL Neck: supple CV: RRR Pulm: unlabored breathing on RA Abd: soft, NT Extr: wwp, no edema, L thigh dressing clean and dry, compartments soft  Results for orders placed or performed during the hospital encounter of 06/08/24 (from the past 24 hours)  Glucose, capillary     Status: Abnormal   Collection Time: 06/13/24 11:55 AM  Result Value Ref Range   Glucose-Capillary 114 (H) 70 - 99 mg/dL  Glucose, capillary     Status: None   Collection Time: 06/13/24  4:48 PM  Result Value Ref Range   Glucose-Capillary 85 70 - 99 mg/dL  Glucose, capillary     Status: Abnormal   Collection Time: 06/13/24  8:12 PM  Result Value Ref Range   Glucose-Capillary 142 (H) 70 - 99 mg/dL  Glucose, capillary     Status: Abnormal   Collection Time: 06/13/24 11:20 PM  Result Value Ref Range   Glucose-Capillary 150 (H) 70 - 99 mg/dL  Glucose, capillary     Status: Abnormal   Collection Time: 06/13/24 11:29 PM  Result Value Ref Range   Glucose-Capillary 146 (H) 70 - 99 mg/dL  Glucose, capillary     Status: None   Collection Time: 06/14/24  3:33 AM  Result Value Ref Range   Glucose-Capillary 87 70 - 99 mg/dL  Glucose, capillary     Status: None   Collection Time: 06/14/24  7:50 AM  Result Value Ref Range   Glucose-Capillary 91 70 - 99 mg/dL    Assessment &  Plan: The plan of care was discussed with the bedside nurse for the day, who is in agreement with this plan and no additional concerns were raised.   Present on Admission:  Thoracic spine fracture (HCC)    LOS: 6 days   Additional comments:I reviewed the patient's new clinical lab test results.   and I reviewed the patients new imaging test results.    GLF 7/18 L femur fx - s/p IMN ([facility])- TDWB LLE Presacral hematoma with possible occult sacral fx - Ortho consulted. Trend hgb- see below T11-T12 fx - s/p T11-L1 posterior percutaneous instrumentation Cathy). TLSO brace when out of bed Neck pain - CT neg for fx, MRI with degenerative disc disease at C6-7 with mild-to-moderate central spinal canal stenosis and mild left neural foraminal stenosis, no acute injury L elbow pain - xray neg R shoulder pain - xray neg R knee pain - xray neg ABL anemia - hgb stable at 9.5 Hx Osteoporosis Hx CAD Hx CHF Hx COPD Hx Cirrhosis Hx Fibromyalgia Hx HTN - reports resolved since she had her duodenal switch Hx HLD -  reports resolved since she had her duodenal switch Hx Hypothyroidism Hx OSA -  reports no longer on CPAP since she had her duodenal switch Hx Pulm HTN Hx Seizures Hx CVA   Hx duodenal switch - avoid  NSAIDs, multi. CBG monitoring given she reports episodes of hypoglycemia since surgery, but sugars look good. FEN - regular diet VTE - SCDs, Lovenox  ID - None currently.  Foley - foley out 7/19 Dispo - 4NP, therapies rec CIR, auth denied.  CIR has sent for appeal, but will work on SNF as a back up.  Patient remains medically stable for DC when dispo figured out.   Burnard FORBES Banter, PA-C  Trauma & General Surgery Please use [email or web address] to contact on call provider  06/14/2024  *Care during the described time interval was provided by me. I have reviewed this patient's available data, including medical history, events of note, physical examination and test results as part of my  evaluation.

## 2024-06-14 NOTE — Progress Notes (Signed)
 Physical Therapy Treatment Patient Details Name: Kimberly [name] MRN: [id number] DOB: 1956-01-06 Today's Date: 06/14/2024   History of Present Illness 68 y.o. female who presented 06/08/24 via EMS after a fall. Cervical MRI negative; T11/12 multi column Chance type fracture (7/18 ORIF T11-L1); L femur fx (7/18 ORIF) PMH significant for CAD, CHF, COPD, Cirrhosis, Fibromyalgia, HTN, HLD, Hypothyroidism, OSA, Pulm HTN, Seizures, CVA.    PT Comments  Patient up in chair on arrival. Ice pack to back, multiple pillows for support and trunk reclined with legs elevated and still with pain 8/10. States she had been up for ~45 minutes. Assisted to walk in room with CGA x 12 ft (limited by back and chest pain). Returned to bed with min assist and max cues for technique. Performed AAROM LLE with improving ROM. Continue to feel pt will benefit from post-acute inpatient therapies >3 hrs/day.     If plan is discharge home, recommend the following: A lot of help with bathing/dressing/bathroom;Assistance with cooking/housework;Assist for transportation;Help with stairs or ramp for entrance;A little help with walking and/or transfers   Can travel by private vehicle        Equipment Recommendations  None recommended by PT    Recommendations for Other Services       Precautions / Restrictions Precautions Precautions: Back Precaution Booklet Issued: Yes (comment) Recall of Precautions/Restrictions: Impaired Precaution/Restrictions Comments: brace donned seated; no OOB without brace; may remove to shower; pt able to state 2/3 precautions and no cues needed to adhere Required Braces or Orthoses: Spinal Brace Spinal Brace: Thoracolumbosacral orthotic;Applied in sitting position Restrictions Weight Bearing Restrictions Per Provider Order: Yes LLE Weight Bearing Per Provider Order: Touchdown weight bearing     Mobility  Bed Mobility Overal bed mobility: Needs Assistance Bed Mobility: Rolling, Sit to  Sidelying Rolling: Used rails, Contact guard assist       Sit to sidelying: Min assist, Used rails General bed mobility comments: return to bed; min assist to raise legs and max cues for sequencing to maintain back precautions    Transfers Overall transfer level: Needs assistance Equipment used: Rolling walker (2 wheels) Transfers: Sit to/from Stand Sit to Stand: Min assist           General transfer comment: no cues for correct hand placement; steadying assist as transitioning hands from furniture up to RW    Ambulation/Gait Ambulation/Gait assistance: Contact guard assist Gait Distance (Feet): 12 Feet Assistive device: Rolling walker (2 wheels) Gait Pattern/deviations: Step-to pattern   Gait velocity interpretation: <1.31 ft/sec, indicative of household ambulator   General Gait Details: able to use UEs to support her weight and advance RLE while maintaining TDWB LLE; limited by pain in chest/ribs when weight-bearing through arms   Stairs             Wheelchair Mobility     Tilt Bed    Modified Rankin (Stroke Patients Only)       Balance Overall balance assessment: Needs assistance Sitting-balance support: No upper extremity supported Sitting balance-Leahy Scale: Good Sitting balance - Comments: statically   Standing balance support: Bilateral upper extremity supported Standing balance-Leahy Scale: Poor Standing balance comment: reliant on device and external assist intermittently                            Communication Communication Communication: No apparent difficulties  Cognition Arousal: Alert Behavior During Therapy: [facility] for tasks assessed/performed  Following commands: Intact      Cueing Cueing Techniques: Verbal cues  Exercises General Exercises - Lower Extremity Ankle Circles/Pumps: AROM, Both, 10 reps Heel Slides: AAROM, 10 reps, Left Hip ABduction/ADduction: AAROM, Left, 10  reps Straight Leg Raises: AAROM, Left, Other (comment) (2 reps with incr back pain and deferred)    General Comments General comments (skin integrity, edema, etc.): VSS per monitor      Pertinent Vitals/Pain Pain Assessment Pain Assessment: Faces Faces Pain Scale: Hurts whole lot Pain Location: back, LLE Pain Descriptors / Indicators: Aching, Sore Pain Intervention(s): Limited activity within patient's tolerance, Monitored during session, Repositioned, Premedicated before session    Home Living                          Prior Function            PT Goals (current goals can now be found in the care plan section) Acute Rehab PT Goals Patient Stated Goal: avoid going to SNF PT Goal Formulation: With patient Time For Goal Achievement: 06/23/24 Potential to Achieve Goals: Good Progress towards PT goals: Progressing toward goals    Frequency    Min 3X/week      PT Plan      Co-evaluation              AM-PAC PT 6 Clicks Mobility   Outcome Measure  Help needed turning from your back to your side while in a flat bed without using bedrails?: A Little Help needed moving from lying on your back to sitting on the side of a flat bed without using bedrails?: A Little Help needed moving to and from a bed to a chair (including a wheelchair)?: A Little Help needed standing up from a chair using your arms (e.g., wheelchair or bedside chair)?: A Little Help needed to walk in hospital room?: A Little Help needed climbing 3-5 steps with a railing? : Total 6 Click Score: 16    End of Session Equipment Utilized During Treatment: Gait belt Activity Tolerance: Patient limited by pain Patient left: with call bell/phone within reach;in bed   PT Visit Diagnosis: Other abnormalities of gait and mobility (R26.89);History of falling (Z91.81);Muscle weakness (generalized) (M62.81)     Time: 8951-8892 PT Time Calculation (min) (ACUTE ONLY): 19 min  Charges:    $Gait  Training: 8-22 mins PT General Charges $$ ACUTE PT VISIT: 1 Visit                      Kimberly [name], PT Acute Rehabilitation Services  Office [phone]    Kimberly [name] 06/14/2024, 11:20 AM

## 2024-06-15 LAB — GLUCOSE, CAPILLARY
Glucose-Capillary: 103 mg/dL — ABNORMAL HIGH (ref 70–99)
Glucose-Capillary: 104 mg/dL — ABNORMAL HIGH (ref 70–99)
Glucose-Capillary: 125 mg/dL — ABNORMAL HIGH (ref 70–99)
Glucose-Capillary: 138 mg/dL — ABNORMAL HIGH (ref 70–99)
Glucose-Capillary: 89 mg/dL (ref 70–99)
Glucose-Capillary: 96 mg/dL (ref 70–99)
Glucose-Capillary: 97 mg/dL (ref 70–99)

## 2024-06-15 NOTE — [place] FL2 (Signed)
 [place]  MEDICAID FL2 LEVEL OF CARE FORM     IDENTIFICATION  Patient Name: Kimberly [name] Birthdate: August 04, 1956 Sex: female Admission Date (Current Location): 06/08/2024  [facility] [place] [facility] [place] and [place] Number:  Producer, television/film/video and Address:  [facility] [place], [address] [place], [place] [address]      Provider Number: [id number]  Attending Physician Name and Address:  Md, Trauma, MD  Relative Name and Phone Number:  Rosalee, Tolley (Spouse)  [phone] (Mobile)    Current Level of Care: Hospital Recommended Level of Care: Skilled Nursing Facility Prior Approval Number:    Date Approved/Denied:   PASRR Number: [id number]  Discharge Plan:      Current Diagnoses: Patient Active Problem List   Diagnosis Date Noted   Thoracic spine fracture (HCC) 06/08/2024   Copper  deficiency 04/30/2024   Vitamin B12 deficiency 04/30/2024   RUQ pain 07/19/2023   Family history of colon cancer 06/28/2023   Closed fracture of head of right humerus 03/23/2023   Blood in stool 02/12/2023   Closed fracture of right proximal humerus 02/11/2023   Left patella fracture 02/11/2023   Accidental fall 02/11/2023   GI bleed 02/11/2023   Nasal fracture 02/11/2023   Thrombocytopenia (HCC) 02/11/2023   History of cirrhosis 02/11/2023   Hemorrhoids 01/17/2023   Gastroesophageal reflux disease without esophagitis 01/17/2023   Diarrhea 05/17/2022   Lower abdominal pain 05/17/2022   Nausea without vomiting 05/17/2022   Unilateral primary osteoarthritis, left knee 02/18/2022   IBS (irritable bowel syndrome) 06/08/2021   Chronic diarrhea 06/08/2021   Irritable bowel syndrome with diarrhea 06/08/2021   Quadriceps weakness 03/19/2021   S/P total knee arthroplasty, right 12/18/2020   Morbid obesity (HCC) 02/08/2018   Pancytopenia (HCC) 04/09/2017   Liver cirrhosis secondary to NASH (HCC) 04/09/2017   DM type 2 causing vascular disease (HCC) 04/06/2017   Essential hypertension  04/06/2017   OSA (obstructive sleep apnea) 04/06/2017   Asthma in adult 04/06/2017   Obesity (BMI 30-39.9) 04/06/2017   Primary osteoarthritis of both hands 10/26/2016   Primary osteoarthritis of both feet 10/26/2016   COPD (chronic obstructive pulmonary disease) (HCC) 09/27/2016   Acquired hypothyroidism 09/27/2016   Vertebral compression fracture (HCC) 09/27/2016   Fibromyalgia 09/27/2016   Restless leg syndrome 09/27/2016   Lumbar radiculitis 10/01/2014   Nerve pain 08/30/2014   Lumbar stenosis with neurogenic claudication 08/28/2014   Spondylosis of lumbar region without myelopathy or radiculopathy 08/23/2014   Lumbosacral stenosis with neurogenic claudication 08/23/2014   Vaginal discharge 06/22/2014   Chest pain 06/21/2014   Spinal stenosis 08/23/2011   Obstructive sleep apnea 08/23/2011   Diabetes (HCC) 08/12/2010   Coronary atherosclerosis 08/12/2010   Chronic diastolic heart failure (HCC) 08/12/2010   Mixed hyperlipidemia 06/23/2009   Overweight 06/23/2009   Essential hypertension, benign 06/23/2009   CHEST PAIN-UNSPECIFIED 06/23/2009    Orientation RESPIRATION BLADDER Height & Weight     Self, Time, Situation, Place  Normal External catheter Weight: 139 lb 8.8 oz (63.3 kg) Height:  5' (152.4 cm)  BEHAVIORAL SYMPTOMS/MOOD NEUROLOGICAL BOWEL NUTRITION STATUS      Continent Diet (reg)  AMBULATORY STATUS COMMUNICATION OF NEEDS Skin   Limited Assist Verbally Surgical wounds (L leg & back)                       Personal Care Assistance Level of Assistance  Bathing, Feeding, Dressing Bathing Assistance: Limited assistance Feeding assistance: Independent Dressing Assistance: Limited assistance     Functional  Limitations Info  Sight Sight Info: Impaired        SPECIAL CARE FACTORS FREQUENCY  PT (By licensed PT), OT (By licensed OT)     PT Frequency: 5 times per week OT Frequency: 5 times per week            Contractures      Additional Factors  Info  Code Status, Allergies Code Status Info: full Allergies Info: Aleve (Naproxen), Cinnamon, Other, Roxicodone  (Oxycodone ), Atrovent Nasal Spray (Ipratropium), Feldene (Piroxicam), Flonase (Fluticasone ), Lyrica (Pregabalin), Nsaids, Phenergan (Promethazine Hcl), Sulfa Antibiotics, Zohydro Er (Hydrocodone  Bitartrate Er), Latex, Tape           Current Medications (06/15/2024):  This is the current hospital active medication list Current Facility-Administered Medications  Medication Dose Route Frequency Provider Last Rate Last Admin   albuterol  (PROVENTIL ) (2.5 MG/3ML) 0.083% nebulizer solution 2.5 mg  2.5 mg Inhalation QID PRN Lovick, Ayesha N, MD       alum & mag hydroxide-simeth (MAALOX/MYLANTA) 200-200-20 MG/5ML suspension 30 mL  30 mL Oral Q4H PRN Tammy Sor, PA-C       artificial tears ophthalmic solution 1 drop  1 drop Both Eyes PRN Tammy Sor, PA-C       budesonide -glycopyrrolate -formoterol  (BREZTRI ) 160-9-4.8 MCG/ACT inhaler 2 puff  2 puff Inhalation BID Paola Dreama SAILOR, MD   2 puff at 06/15/24 0839   calcium  carbonate (TUMS - dosed in mg elemental calcium ) chewable tablet 200 mg of elemental calcium   1 tablet Oral TID WC PRN Tammy Sor, PA-C   200 mg of elemental calcium  at 06/15/24 0318   Chlorhexidine  Gluconate Cloth 2 % PADS 6 each  6 each Topical Daily Paola Dreama SAILOR, MD   6 each at 06/14/24 0839   cholecalciferol  (VITAMIN D3) 25 MCG (1000 UNIT) tablet 5,000 Units  5,000 Units Oral Daily Signe Cree A, MD   5,000 Units at 06/15/24 0910   diphenhydrAMINE  (BENADRYL ) capsule 25 mg  25 mg Oral Q6H PRN Paola Dreama SAILOR, MD   25 mg at 06/12/24 2203   docusate sodium  (COLACE) capsule 100 mg  100 mg Oral BID Paola Dreama SAILOR, MD   100 mg at 06/15/24 9088   enoxaparin  (LOVENOX ) injection 30 mg  30 mg Subcutaneous BID Tammy Sor, PA-C   30 mg at 06/15/24 0913   furosemide  (LASIX ) tablet 20 mg  20 mg Oral Daily Lovick, Ayesha N, MD   20 mg at 06/15/24 9086   gabapentin   (NEURONTIN ) capsule 100 mg  100 mg Oral BID WC Paola Dreama SAILOR, MD   100 mg at 06/15/24 9088   hydrALAZINE  (APRESOLINE ) injection 10 mg  10 mg Intravenous Q2H PRN Paola Dreama SAILOR, MD       HYDROcodone -acetaminophen  (NORCO/VICODIN) 5-325 MG per tablet 1 tablet  1 tablet Oral Q4H PRN Tomlinson, Sara Caylin, PA-C   1 tablet at 06/14/24 1831   HYDROcodone -acetaminophen  (NORCO/VICODIN) 5-325 MG per tablet 2 tablet  2 tablet Oral Q4H PRN Tomlinson, Sara Caylin, PA-C   2 tablet at 06/15/24 0911   levothyroxine  (SYNTHROID ) tablet 125 mcg  125 mcg Oral QAC breakfast Paola Dreama SAILOR, MD   125 mcg at 06/15/24 0513   melatonin tablet 5 mg  5 mg Oral QHS PRN Tammy Sor, PA-C   5 mg at 06/14/24 2144   menthol -cetylpyridinium (CEPACOL) lozenge 3 mg  1 lozenge Oral PRN Johnanna Credit Caylin, PA-C       Or   phenol (CHLORASEPTIC) mouth spray 1 spray  1 spray Mouth/Throat  PRN Tomlinson, Sara Caylin, PA-C       methocarbamol  (ROBAXIN ) tablet 750 mg  750 mg Oral QID Paola Dreama SAILOR, MD   750 mg at 06/15/24 0907   metoprolol  tartrate (LOPRESSOR ) injection 5 mg  5 mg Intravenous Q6H PRN Paola Dreama SAILOR, MD       morphine  (PF) 2 MG/ML injection 1 mg  1 mg Intravenous Q4H PRN Tammy Sor, PA-C       multivitamin with minerals tablet 1 tablet  1 tablet Oral Daily Lovick, Ayesha N, MD   1 tablet at 06/15/24 9093   ondansetron  (ZOFRAN -ODT) disintegrating tablet 4 mg  4 mg Oral Q6H PRN Paola Dreama SAILOR, MD       Or   ondansetron  (ZOFRAN ) injection 4 mg  4 mg Intravenous Q6H PRN Paola Dreama SAILOR, MD   4 mg at 06/09/24 0423   pantoprazole  (PROTONIX ) EC tablet 40 mg  40 mg Oral BID Tammy Sor, PA-C   40 mg at 06/15/24 0911   polyethylene glycol (MIRALAX  / GLYCOLAX ) packet 17 g  17 g Oral Daily Tammy Sor, PA-C   17 g at 06/12/24 9074   sodium chloride  flush (NS) 0.9 % injection 3 mL  3 mL Intravenous Q12H Johnanna Credit Caylin, PA-C   3 mL at 06/15/24 9085   sodium chloride  flush (NS) 0.9 % injection 3 mL   3 mL Intravenous PRN Johnanna Credit Caylin, PA-C       spironolactone  (ALDACTONE ) tablet 25 mg  25 mg Oral Daily Lovick, Ayesha N, MD   25 mg at 06/15/24 9088   triamcinolone  cream (KENALOG ) 0.1 % cream 1 Application  1 Application Topical BID PRN Paola Dreama SAILOR, MD   1 Application at 06/09/24 2110     Discharge Medications: Please see discharge summary for a list of discharge medications.  Relevant Imaging Results:  Relevant Lab Results:   Additional Information SS #: [id number]  Takaya Hyslop E Navjot Pilgrim, LCSW

## 2024-06-15 NOTE — Progress Notes (Signed)
 Trauma/Critical Care Follow Up Note  Subjective:    Overnight Issues:  no new complaints today.  Objective:  Vital signs for last 24 hours: Temp:  [98.2 F (36.8 C)-98.8 F (37.1 C)] 98.8 F (37.1 C) (07/25 0806) Pulse Rate:  [67-100] 68 (07/25 0806) Resp:  [14-16] 16 (07/25 0806) BP: (96-108)/(54-69) 106/54 (07/25 0806) SpO2:  [96 %-100 %] 97 % (07/25 0806)  Intake/Output from previous day: 07/24 0701 - 07/25 0700 In: -  Out: 1550 [Urine:1550]  Intake/Output this shift: Total I/O In: -  Out: 850 [Urine:850]  Physical Exam:  Gen: comfortable, no distress Neuro: follows commands, alert, communicative HEENT: PERRL Neck: supple CV: RRR Pulm: unlabored breathing on RA Abd: soft, NT Extr: wwp, no edema, L thigh dressing clean and dry with surrounding ecchymosis, compartments soft  Results for orders placed or performed during the hospital encounter of 06/08/24 (from the past 24 hours)  Glucose, capillary     Status: Abnormal   Collection Time: 06/14/24 11:10 AM  Result Value Ref Range   Glucose-Capillary 105 (H) 70 - 99 mg/dL  Glucose, capillary     Status: Abnormal   Collection Time: 06/14/24  3:47 PM  Result Value Ref Range   Glucose-Capillary 104 (H) 70 - 99 mg/dL  Glucose, capillary     Status: Abnormal   Collection Time: 06/14/24  8:06 PM  Result Value Ref Range   Glucose-Capillary 132 (H) 70 - 99 mg/dL  Glucose, capillary     Status: None   Collection Time: 06/15/24 12:24 AM  Result Value Ref Range   Glucose-Capillary 97 70 - 99 mg/dL  Glucose, capillary     Status: Abnormal   Collection Time: 06/15/24  3:10 AM  Result Value Ref Range   Glucose-Capillary 103 (H) 70 - 99 mg/dL  Glucose, capillary     Status: Abnormal   Collection Time: 06/15/24  8:02 AM  Result Value Ref Range   Glucose-Capillary 104 (H) 70 - 99 mg/dL    Assessment & Plan: The plan of care was discussed with the bedside nurse for the day, who is in agreement with this plan and no  additional concerns were raised.   Present on Admission:  Thoracic spine fracture (HCC)    LOS: 7 days   Additional comments:I reviewed the patient's new clinical lab test results.   and I reviewed the patients new imaging test results.    GLF 7/18 L femur fx - s/p IMN (Handy)- TDWB LLE Presacral hematoma with possible occult sacral fx - Ortho consulted. Trend hgb- see below T11-T12 fx - s/p T11-L1 posterior percutaneous instrumentation Cathy). TLSO brace when out of bed Neck pain - CT neg for fx, MRI with degenerative disc disease at C6-7 with mild-to-moderate central spinal canal stenosis and mild left neural foraminal stenosis, no acute injury L elbow pain - xray neg R shoulder pain - xray neg R knee pain - xray neg ABL anemia - hgb stable at 9.5 Hx Osteoporosis Hx CAD Hx CHF Hx COPD Hx Cirrhosis Hx Fibromyalgia Hx HTN - reports resolved since she had her duodenal switch Hx HLD -  reports resolved since she had her duodenal switch Hx Hypothyroidism Hx OSA -  reports no longer on CPAP since she had her duodenal switch Hx Pulm HTN Hx Seizures Hx CVA   Hx duodenal switch - avoid NSAIDs, multi. CBG monitoring given she reports episodes of hypoglycemia since surgery, but sugars look good. FEN - regular diet VTE - SCDs, Lovenox  ID -  None currently.  Foley - foley out 7/19 Dispo - 4NP, therapies rec CIR, auth denied.  CIR has sent for appeal, but will work on SNF as a back up.  Patient remains medically stable for DC when dispo figured out.   Burnard FORBES Banter, PA-C  Trauma & General Surgery Please use [email or web address] to contact on call provider  06/15/2024  *Care during the described time interval was provided by me. I have reviewed this patient's available data, including medical history, events of note, physical examination and test results as part of my evaluation.

## 2024-06-15 NOTE — TOC Progression Note (Signed)
 Transition of Care [facility]) - Progression Note    Patient Details  Name: Kimberly [name] MRN: [id number] Date of Birth: 05-13-1956  [facility]) CM/SW Contact  Carmelita FORBES Carbon, LCSW Phone Number: 06/15/2024, 9:08 AM  Clinical Narrative:    CSW spoke with patient. Patient is very hopeful that CIR will be approved by insurance. Discussed back up plan if CIR appeal is denied. Patient states she would consider SNF near home [facility]), the only one she knows of that she would want is [facility].  Sending out for SNF as a back up.   Expected Discharge Plan: IP Rehab Facility Barriers to Discharge: Continued Medical Work up               Expected Discharge Plan and Services       Living arrangements for the past 2 months: Single Family Home                                       Social Drivers of Health (SDOH) Interventions SDOH Screenings   Food Insecurity: Food Insecurity Present (06/09/2024)  Housing: Low Risk  (06/09/2024)  Transportation Needs: No Transportation Needs (06/09/2024)  Utilities: Not At Risk (06/09/2024)  Depression (PHQ2-9): Low Risk  (04/13/2024)  Financial Resource Strain: Low Risk  (06/29/2023)  Recent Concern: Financial Resource Strain - Medium Risk (06/29/2023)  Social Connections: Moderately Integrated (06/09/2024)  Tobacco Use: Low Risk  (06/08/2024)  Health Literacy: Adequate Health Literacy (08/30/2023)    Readmission Risk Interventions    02/15/2023    9:21 AM  Readmission Risk Prevention Plan  Post Dischage Appt Complete  Medication Screening Complete  Transportation Screening Complete

## 2024-06-15 NOTE — Plan of Care (Signed)

## 2024-06-15 NOTE — Plan of Care (Signed)

## 2024-06-15 NOTE — Progress Notes (Signed)
 Physical Therapy Treatment Patient Details Name: Kimberly [name] MRN: [id number] DOB: 1956-07-25 Today's Date: 06/15/2024   History of Present Illness 68 y.o. female who presented 06/08/24 via EMS after a fall. Cervical MRI negative; T11/12 multi column Chance type fracture (7/18 ORIF T11-L1); L femur fx (7/18 ORIF) PMH significant for CAD, CHF, COPD, Cirrhosis, Fibromyalgia, HTN, HLD, Hypothyroidism, OSA, Pulm HTN, Seizures, CVA.    PT Comments  Pt tolerates treatment well, increasing tolerance for ambulation and maintaining TDWB well through LLE. Pt continues to require assistance for TLSO management but does recall all back precautions during session. PT will continue to follow in an effort to improve activity tolerance and to restore independence. Patient will benefit from intensive inpatient follow-up therapy, >3 hours/day.    If plan is discharge home, recommend the following: A lot of help with bathing/dressing/bathroom;Assistance with cooking/housework;Assist for transportation;Help with stairs or ramp for entrance;A little help with walking and/or transfers   Can travel by private vehicle        Equipment Recommendations  None recommended by PT    Recommendations for Other Services       Precautions / Restrictions Precautions Precautions: Back Precaution Booklet Issued: Yes (comment) Recall of Precautions/Restrictions: Intact Required Braces or Orthoses: Spinal Brace Spinal Brace: Lumbar corset;Applied in sitting position (minA to don brace) Restrictions Weight Bearing Restrictions Per Provider Order: Yes LLE Weight Bearing Per Provider Order: Touchdown weight bearing     Mobility  Bed Mobility Overal bed mobility: Needs Assistance Bed Mobility: Rolling, Sidelying to Sit Rolling: Supervision Sidelying to sit: Contact guard assist, Used rails            Transfers Overall transfer level: Needs assistance Equipment used: Rolling walker (2 wheels) Transfers: Sit  to/from Stand Sit to Stand: Contact guard assist           General transfer comment: verbal cues for hand placement    Ambulation/Gait Ambulation/Gait assistance: Contact guard assist Gait Distance (Feet): 15 Feet (15' x 2 trials) Assistive device: Rolling walker (2 wheels) Gait Pattern/deviations: Step-to pattern Gait velocity: reduced Gait velocity interpretation: <1.31 ft/sec, indicative of household ambulator   General Gait Details: pt with slowed step-to gait, maintains TDWB through toes on L foot well   Stairs             Wheelchair Mobility     Tilt Bed    Modified Rankin (Stroke Patients Only)       Balance Overall balance assessment: Needs assistance Sitting-balance support: No upper extremity supported, Feet supported Sitting balance-Leahy Scale: Good     Standing balance support: Bilateral upper extremity supported, Reliant on assistive device for balance Standing balance-Leahy Scale: Poor                              Communication Communication Communication: No apparent difficulties  Cognition Arousal: Alert Behavior During Therapy: WFL for tasks assessed/performed   PT - Cognitive impairments: No apparent impairments                         Following commands: Intact      Cueing Cueing Techniques: Verbal cues  Exercises      General Comments General comments (skin integrity, edema, etc.): VSS on RA      Pertinent Vitals/Pain Pain Assessment Pain Assessment: 0-10 Pain Score: 7  Pain Location: back and LLE Pain Descriptors / Indicators: Aching Pain Intervention(s): Monitored during session  Home Living                          Prior Function            PT Goals (current goals can now be found in the care plan section) Acute Rehab PT Goals Patient Stated Goal: to improve ambulation tolerance Progress towards PT goals: Progressing toward goals    Frequency    Min 3X/week       PT Plan      Co-evaluation              AM-PAC PT 6 Clicks Mobility   Outcome Measure  Help needed turning from your back to your side while in a flat bed without using bedrails?: A Little Help needed moving from lying on your back to sitting on the side of a flat bed without using bedrails?: A Little Help needed moving to and from a bed to a chair (including a wheelchair)?: A Little Help needed standing up from a chair using your arms (e.g., wheelchair or bedside chair)?: A Little Help needed to walk in hospital room?: A Little Help needed climbing 3-5 steps with a railing? : Total 6 Click Score: 16    End of Session Equipment Utilized During Treatment: Gait belt Activity Tolerance: Patient tolerated treatment well Patient left: in chair;with call bell/phone within reach;with chair alarm set Nurse Communication: Mobility status PT Visit Diagnosis: Other abnormalities of gait and mobility (R26.89);History of falling (Z91.81);Muscle weakness (generalized) (M62.81)     Time: 8981-8948 PT Time Calculation (min) (ACUTE ONLY): 33 min  Charges:    $Gait Training: 8-22 mins $Therapeutic Activity: 8-22 mins PT General Charges $$ ACUTE PT VISIT: 1 Visit                     Bernardino JINNY Ruth, PT, DPT Acute Rehabilitation Office [phone]    Bernardino JINNY Ruth 06/15/2024, 12:32 PM

## 2024-06-15 NOTE — Progress Notes (Signed)
 Inpatient Rehab Admissions Coordinator:  Awaiting appeal decision. Will continue to follow.   Tinnie Yvone Cohens, MS, CCC-SLP Admissions Coordinator [phone]

## 2024-06-16 LAB — GLUCOSE, CAPILLARY
Glucose-Capillary: 97 mg/dL (ref 70–99)
Glucose-Capillary: 78 mg/dL (ref 70–99)
Glucose-Capillary: 82 mg/dL (ref 70–99)
Glucose-Capillary: 109 mg/dL — ABNORMAL HIGH (ref 70–99)
Glucose-Capillary: 117 mg/dL — ABNORMAL HIGH (ref 70–99)
Glucose-Capillary: 104 mg/dL — ABNORMAL HIGH (ref 70–99)

## 2024-06-16 NOTE — Progress Notes (Signed)
 Trauma/Critical Care Follow Up Note  Subjective:    Overnight Issues:  no new complaints today.  Pain seems better.  Did better with PT yesterday  Objective:  Vital signs for last 24 hours: Temp:  [97.9 F (36.6 C)-98.7 F (37.1 C)] 98.2 F (36.8 C) (07/26 0733) Pulse Rate:  [67-74] 67 (07/26 0733) Resp:  [16-18] 16 (07/26 0733) BP: (93-113)/(50-84) 113/68 (07/26 0733) SpO2:  [96 %-98 %] 96 % (07/26 0733)  Intake/Output from previous day: 07/25 0701 - 07/26 0700 In: 240 [P.O.:240] Out: 2325 [Urine:2325]  Intake/Output this shift: Total I/O In: -  Out: 600 [Urine:600]  Physical Exam:  Gen: comfortable, no distress Neuro: follows commands, alert, communicative HEENT: PERRL Neck: supple CV: RRR Pulm: unlabored breathing on RA Abd: soft, NT Extr: wwp, no edema, L thigh dressing clean and dry with surrounding ecchymosis  Results for orders placed or performed during the hospital encounter of 06/08/24 (from the past 24 hours)  Glucose, capillary     Status: None   Collection Time: 06/15/24 11:58 AM  Result Value Ref Range   Glucose-Capillary 96 70 - 99 mg/dL  Glucose, capillary     Status: Abnormal   Collection Time: 06/15/24  4:11 PM  Result Value Ref Range   Glucose-Capillary 125 (H) 70 - 99 mg/dL  Glucose, capillary     Status: Abnormal   Collection Time: 06/15/24  8:18 PM  Result Value Ref Range   Glucose-Capillary 138 (H) 70 - 99 mg/dL  Glucose, capillary     Status: None   Collection Time: 06/15/24 11:27 PM  Result Value Ref Range   Glucose-Capillary 89 70 - 99 mg/dL  Glucose, capillary     Status: None   Collection Time: 06/16/24  3:38 AM  Result Value Ref Range   Glucose-Capillary 97 70 - 99 mg/dL  Glucose, capillary     Status: None   Collection Time: 06/16/24  7:30 AM  Result Value Ref Range   Glucose-Capillary 78 70 - 99 mg/dL    Assessment & Plan: The plan of care was discussed with the bedside nurse for the day, who is in agreement with this  plan and no additional concerns were raised.   Present on Admission:  Thoracic spine fracture (HCC)    LOS: 8 days   Additional comments:I reviewed the patient's new clinical lab test results.   and I reviewed the patients new imaging test results.    GLF 7/18 L femur fx - s/p IMN (Handy)- TDWB LLE Presacral hematoma with possible occult sacral fx - Ortho consulted. Trend hgb- see below T11-T12 fx - s/p T11-L1 posterior percutaneous instrumentation Cathy). TLSO brace when out of bed Neck pain - CT neg for fx, MRI with degenerative disc disease at C6-7 with mild-to-moderate central spinal canal stenosis and mild left neural foraminal stenosis, no acute injury L elbow pain - xray neg R shoulder pain - xray neg R knee pain - xray neg ABL anemia - hgb stable at 9.5 Hx Osteoporosis Hx CAD Hx CHF Hx COPD Hx Cirrhosis Hx Fibromyalgia Hx HTN - reports resolved since she had her duodenal switch Hx HLD -  reports resolved since she had her duodenal switch Hx Hypothyroidism Hx OSA -  reports no longer on CPAP since she had her duodenal switch Hx Pulm HTN Hx Seizures Hx CVA   Hx duodenal switch - avoid NSAIDs, multi. CBG monitoring given she reports episodes of hypoglycemia since surgery, but sugars look good. FEN - regular diet  VTE - SCDs, Lovenox  ID - None currently.  Foley - foley out 7/19 Dispo - 4NP, therapies rec CIR, auth denied.  CIR has sent for appeal, but will work on SNF as a back up.  Patient remains medically stable for DC when dispo figured out.   Burnard FORBES Banter, PA-C  Trauma & General Surgery Please use [email or web address] to contact on call provider  06/16/2024  *Care during the described time interval was provided by me. I have reviewed this patient's available data, including medical history, events of note, physical examination and test results as part of my evaluation.

## 2024-06-17 LAB — GLUCOSE, CAPILLARY
Glucose-Capillary: 98 mg/dL (ref 70–99)
Glucose-Capillary: 129 mg/dL — ABNORMAL HIGH (ref 70–99)
Glucose-Capillary: 75 mg/dL (ref 70–99)
Glucose-Capillary: 86 mg/dL (ref 70–99)
Glucose-Capillary: 100 mg/dL — ABNORMAL HIGH (ref 70–99)
Glucose-Capillary: 102 mg/dL — ABNORMAL HIGH (ref 70–99)

## 2024-06-17 NOTE — Progress Notes (Signed)
 Trauma/Critical Care Follow Up Note  Subjective:    Overnight Issues:  no new complaints today.  Objective:  Vital signs for last 24 hours: Temp:  [98.1 F (36.7 C)-98.4 F (36.9 C)] 98.1 F (36.7 C) (07/27 0803) Pulse Rate:  [67-73] 68 (07/27 0803) Resp:  [16-17] 16 (07/27 0803) BP: (98-116)/(55-62) 116/62 (07/27 0803) SpO2:  [96 %-98 %] 97 % (07/27 0803)  Intake/Output from previous day: 07/26 0701 - 07/27 0700 In: 243 [P.O.:240; I.V.:3] Out: 2450 [Urine:2450]  Intake/Output this shift: Total I/O In: 10 [I.V.:10] Out: -   Physical Exam:  Gen: comfortable, no distress Neuro: follows commands, alert, communicative HEENT: PERRL Neck: supple CV: RRR Pulm: unlabored breathing on RA Abd: soft, NT Extr: wwp, no edema, L thigh dressing clean and dry with surrounding ecchymosis  Results for orders placed or performed during the hospital encounter of 06/08/24 (from the past 24 hours)  Glucose, capillary     Status: None   Collection Time: 06/16/24 12:06 PM  Result Value Ref Range   Glucose-Capillary 82 70 - 99 mg/dL  Glucose, capillary     Status: Abnormal   Collection Time: 06/16/24  3:25 PM  Result Value Ref Range   Glucose-Capillary 109 (H) 70 - 99 mg/dL  Glucose, capillary     Status: Abnormal   Collection Time: 06/16/24  7:36 PM  Result Value Ref Range   Glucose-Capillary 117 (H) 70 - 99 mg/dL  Glucose, capillary     Status: Abnormal   Collection Time: 06/16/24 11:22 PM  Result Value Ref Range   Glucose-Capillary 104 (H) 70 - 99 mg/dL  Glucose, capillary     Status: None   Collection Time: 06/17/24  3:38 AM  Result Value Ref Range   Glucose-Capillary 98 70 - 99 mg/dL  Glucose, capillary     Status: Abnormal   Collection Time: 06/17/24  8:03 AM  Result Value Ref Range   Glucose-Capillary 129 (H) 70 - 99 mg/dL    Assessment & Plan: The plan of care was discussed with the bedside nurse for the day, who is in agreement with this plan and no additional  concerns were raised.   Present on Admission:  Thoracic spine fracture (HCC)    LOS: 9 days   Additional comments:I reviewed the patient's new clinical lab test results.   and I reviewed the patients new imaging test results.    GLF 7/18 L femur fx - s/p IMN ([facility]y)- TDWB LLE Presacral hematoma with possible occult sacral fx - Ortho consulted. Trend hgb- see below T11-T12 fx - s/p T11-L1 posterior percutaneous instrumentation Cathy). TLSO brace when out of bed Neck pain - CT neg for fx, MRI with degenerative disc disease at C6-7 with mild-to-moderate central spinal canal stenosis and mild left neural foraminal stenosis, no acute injury L elbow pain - xray neg R shoulder pain - xray neg R knee pain - xray neg ABL anemia - hgb stable at 9.5 Hx Osteoporosis Hx CAD Hx CHF Hx COPD Hx Cirrhosis Hx Fibromyalgia Hx HTN - reports resolved since she had her duodenal switch Hx HLD -  reports resolved since she had her duodenal switch Hx Hypothyroidism Hx OSA -  reports no longer on CPAP since she had her duodenal switch Hx Pulm HTN Hx Seizures Hx CVA   Hx duodenal switch - avoid NSAIDs, multi. CBG monitoring given she reports episodes of hypoglycemia since surgery, but sugars look good. FEN - regular diet VTE - SCDs, Lovenox  ID - None  currently.  Foley - foley out 7/19 Dispo - 4NP, therapies rec CIR, auth denied.  CIR has sent for appeal, but will work on SNF as a back up.  Patient remains medically stable for DC when dispo figured out.   Burnard FORBES Banter, PA-C  Trauma & General Surgery Please use [email or web address] to contact on call provider  06/17/2024  *Care during the described time interval was provided by me. I have reviewed this patient's available data, including medical history, events of note, physical examination and test results as part of my evaluation.

## 2024-06-18 ENCOUNTER — Encounter (HOSPITAL_COMMUNITY): Payer: Self-pay | Admitting: Physical Medicine and Rehabilitation

## 2024-06-18 ENCOUNTER — Inpatient Hospital Stay (HOSPITAL_COMMUNITY)
Admission: AD | Admit: 2024-06-18 | Discharge: 2024-06-29 | DRG: 945 | Disposition: A | Discharge status: Home Health | Source: Intra-hospital | Attending: Physical Medicine and Rehabilitation | Admitting: Physical Medicine and Rehabilitation

## 2024-06-18 ENCOUNTER — Other Ambulatory Visit: Payer: Self-pay

## 2024-06-18 ENCOUNTER — Encounter (HOSPITAL_COMMUNITY): Payer: Self-pay

## 2024-06-18 DIAGNOSIS — Z888 Allergy status to other drugs, medicaments and biological substances status: Secondary | ICD-10-CM

## 2024-06-18 DIAGNOSIS — M545 Low back pain, unspecified: Secondary | ICD-10-CM | POA: Diagnosis not present

## 2024-06-18 DIAGNOSIS — I959 Hypotension, unspecified: Secondary | ICD-10-CM | POA: Diagnosis present

## 2024-06-18 DIAGNOSIS — K7460 Unspecified cirrhosis of liver: Secondary | ICD-10-CM | POA: Diagnosis not present

## 2024-06-18 DIAGNOSIS — Z882 Allergy status to sulfonamides status: Secondary | ICD-10-CM

## 2024-06-18 DIAGNOSIS — I2510 Atherosclerotic heart disease of native coronary artery without angina pectoris: Secondary | ICD-10-CM | POA: Diagnosis not present

## 2024-06-18 DIAGNOSIS — Z9842 Cataract extraction status, left eye: Secondary | ICD-10-CM

## 2024-06-18 DIAGNOSIS — H409 Unspecified glaucoma: Secondary | ICD-10-CM | POA: Diagnosis present

## 2024-06-18 DIAGNOSIS — M48062 Spinal stenosis, lumbar region with neurogenic claudication: Secondary | ICD-10-CM

## 2024-06-18 DIAGNOSIS — W19XXXD Unspecified fall, subsequent encounter: Secondary | ICD-10-CM | POA: Diagnosis present

## 2024-06-18 DIAGNOSIS — S22082A Unstable burst fracture of T11-T12 vertebra, initial encounter for closed fracture: Secondary | ICD-10-CM | POA: Diagnosis not present

## 2024-06-18 DIAGNOSIS — S22082K Unstable burst fracture of T11-T12 vertebra, subsequent encounter for fracture with nonunion: Secondary | ICD-10-CM | POA: Diagnosis not present

## 2024-06-18 DIAGNOSIS — Z79890 Hormone replacement therapy (postmenopausal): Secondary | ICD-10-CM

## 2024-06-18 DIAGNOSIS — D62 Acute posthemorrhagic anemia: Secondary | ICD-10-CM | POA: Insufficient documentation

## 2024-06-18 DIAGNOSIS — Z961 Presence of intraocular lens: Secondary | ICD-10-CM | POA: Diagnosis present

## 2024-06-18 DIAGNOSIS — Z96651 Presence of right artificial knee joint: Secondary | ICD-10-CM | POA: Diagnosis not present

## 2024-06-18 DIAGNOSIS — Z91048 Other nonmedicinal substance allergy status: Secondary | ICD-10-CM

## 2024-06-18 DIAGNOSIS — R5381 Other malaise: Principal | ICD-10-CM | POA: Diagnosis present

## 2024-06-18 DIAGNOSIS — I5032 Chronic diastolic (congestive) heart failure: Secondary | ICD-10-CM | POA: Diagnosis not present

## 2024-06-18 DIAGNOSIS — S22009A Unspecified fracture of unspecified thoracic vertebra, initial encounter for closed fracture: Principal | ICD-10-CM | POA: Diagnosis present

## 2024-06-18 DIAGNOSIS — Z9841 Cataract extraction status, right eye: Secondary | ICD-10-CM

## 2024-06-18 DIAGNOSIS — I272 Pulmonary hypertension, unspecified: Secondary | ICD-10-CM | POA: Diagnosis present

## 2024-06-18 DIAGNOSIS — Z7722 Contact with and (suspected) exposure to environmental tobacco smoke (acute) (chronic): Secondary | ICD-10-CM | POA: Diagnosis present

## 2024-06-18 DIAGNOSIS — S22088D Other fracture of T11-T12 vertebra, subsequent encounter for fracture with routine healing: Secondary | ICD-10-CM

## 2024-06-18 DIAGNOSIS — Z79899 Other long term (current) drug therapy: Secondary | ICD-10-CM

## 2024-06-18 DIAGNOSIS — J4489 Other specified chronic obstructive pulmonary disease: Secondary | ICD-10-CM | POA: Diagnosis not present

## 2024-06-18 DIAGNOSIS — J449 Chronic obstructive pulmonary disease, unspecified: Secondary | ICD-10-CM | POA: Diagnosis present

## 2024-06-18 DIAGNOSIS — S7292XA Unspecified fracture of left femur, initial encounter for closed fracture: Secondary | ICD-10-CM | POA: Diagnosis present

## 2024-06-18 DIAGNOSIS — Z91040 Latex allergy status: Secondary | ICD-10-CM

## 2024-06-18 DIAGNOSIS — S72402A Unspecified fracture of lower end of left femur, initial encounter for closed fracture: Secondary | ICD-10-CM | POA: Diagnosis not present

## 2024-06-18 DIAGNOSIS — D709 Neutropenia, unspecified: Secondary | ICD-10-CM | POA: Diagnosis not present

## 2024-06-18 DIAGNOSIS — F32A Depression, unspecified: Secondary | ICD-10-CM | POA: Diagnosis present

## 2024-06-18 DIAGNOSIS — S22082D Unstable burst fracture of T11-T12 vertebra, subsequent encounter for fracture with routine healing: Secondary | ICD-10-CM | POA: Diagnosis not present

## 2024-06-18 DIAGNOSIS — F419 Anxiety disorder, unspecified: Secondary | ICD-10-CM | POA: Diagnosis present

## 2024-06-18 DIAGNOSIS — G4733 Obstructive sleep apnea (adult) (pediatric): Secondary | ICD-10-CM | POA: Diagnosis present

## 2024-06-18 DIAGNOSIS — M797 Fibromyalgia: Secondary | ICD-10-CM | POA: Diagnosis not present

## 2024-06-18 DIAGNOSIS — S72002D Fracture of unspecified part of neck of left femur, subsequent encounter for closed fracture with routine healing: Secondary | ICD-10-CM

## 2024-06-18 DIAGNOSIS — K219 Gastro-esophageal reflux disease without esophagitis: Secondary | ICD-10-CM | POA: Diagnosis present

## 2024-06-18 DIAGNOSIS — R7989 Other specified abnormal findings of blood chemistry: Secondary | ICD-10-CM | POA: Insufficient documentation

## 2024-06-18 DIAGNOSIS — Z7951 Long term (current) use of inhaled steroids: Secondary | ICD-10-CM

## 2024-06-18 DIAGNOSIS — D696 Thrombocytopenia, unspecified: Secondary | ICD-10-CM | POA: Diagnosis not present

## 2024-06-18 DIAGNOSIS — Z886 Allergy status to analgesic agent status: Secondary | ICD-10-CM

## 2024-06-18 DIAGNOSIS — D61818 Other pancytopenia: Secondary | ICD-10-CM | POA: Diagnosis present

## 2024-06-18 DIAGNOSIS — M1612 Unilateral primary osteoarthritis, left hip: Secondary | ICD-10-CM | POA: Diagnosis not present

## 2024-06-18 DIAGNOSIS — Z8249 Family history of ischemic heart disease and other diseases of the circulatory system: Secondary | ICD-10-CM

## 2024-06-18 DIAGNOSIS — H3530 Unspecified macular degeneration: Secondary | ICD-10-CM | POA: Diagnosis not present

## 2024-06-18 DIAGNOSIS — K589 Irritable bowel syndrome without diarrhea: Secondary | ICD-10-CM | POA: Diagnosis present

## 2024-06-18 DIAGNOSIS — I110 Hypertensive heart disease with heart failure: Secondary | ICD-10-CM | POA: Diagnosis present

## 2024-06-18 DIAGNOSIS — T07XXXA Unspecified multiple injuries, initial encounter: Secondary | ICD-10-CM

## 2024-06-18 DIAGNOSIS — S300XXD Contusion of lower back and pelvis, subsequent encounter: Secondary | ICD-10-CM

## 2024-06-18 DIAGNOSIS — G629 Polyneuropathy, unspecified: Secondary | ICD-10-CM | POA: Diagnosis present

## 2024-06-18 DIAGNOSIS — M4804 Spinal stenosis, thoracic region: Secondary | ICD-10-CM

## 2024-06-18 DIAGNOSIS — R32 Unspecified urinary incontinence: Secondary | ICD-10-CM | POA: Diagnosis present

## 2024-06-18 DIAGNOSIS — M62838 Other muscle spasm: Secondary | ICD-10-CM | POA: Diagnosis present

## 2024-06-18 DIAGNOSIS — M48061 Spinal stenosis, lumbar region without neurogenic claudication: Secondary | ICD-10-CM | POA: Diagnosis present

## 2024-06-18 DIAGNOSIS — K5900 Constipation, unspecified: Secondary | ICD-10-CM | POA: Diagnosis not present

## 2024-06-18 DIAGNOSIS — M25552 Pain in left hip: Secondary | ICD-10-CM | POA: Diagnosis not present

## 2024-06-18 DIAGNOSIS — E039 Hypothyroidism, unspecified: Secondary | ICD-10-CM | POA: Diagnosis present

## 2024-06-18 DIAGNOSIS — Z8673 Personal history of transient ischemic attack (TIA), and cerebral infarction without residual deficits: Secondary | ICD-10-CM

## 2024-06-18 LAB — CBC WITH DIFFERENTIAL/PLATELET
Abs Immature Granulocytes: 0.02 K/uL (ref 0.00–0.07)
Basophils Absolute: 0 K/uL (ref 0.0–0.1)
Basophils Relative: 1 %
Eosinophils Absolute: 0.2 K/uL (ref 0.0–0.5)
Eosinophils Relative: 4 %
HCT: 29.4 % — ABNORMAL LOW (ref 36.0–46.0)
Hemoglobin: 9.7 g/dL — ABNORMAL LOW (ref 12.0–15.0)
Immature Granulocytes: 1 %
Lymphocytes Relative: 24 %
Lymphs Abs: 0.9 K/uL (ref 0.7–4.0)
MCH: 33.7 pg (ref 26.0–34.0)
MCHC: 33 g/dL (ref 30.0–36.0)
MCV: 102.1 fL — ABNORMAL HIGH (ref 80.0–100.0)
Monocytes Absolute: 0.3 K/uL (ref 0.1–1.0)
Monocytes Relative: 9 %
Neutro Abs: 2.5 K/uL (ref 1.7–7.7)
Neutrophils Relative %: 61 %
Platelets: 203 K/uL (ref 150–400)
RBC: 2.88 MIL/uL — ABNORMAL LOW (ref 3.87–5.11)
RDW: 16 % — ABNORMAL HIGH (ref 11.5–15.5)
WBC: 3.9 K/uL — ABNORMAL LOW (ref 4.0–10.5)
nRBC: 0 % (ref 0.0–0.2)

## 2024-06-18 LAB — GLUCOSE, CAPILLARY
Glucose-Capillary: 86 mg/dL (ref 70–99)
Glucose-Capillary: 82 mg/dL (ref 70–99)
Glucose-Capillary: 108 mg/dL — ABNORMAL HIGH (ref 70–99)
Glucose-Capillary: 93 mg/dL (ref 70–99)

## 2024-06-18 MED ORDER — METHOCARBAMOL 750 MG PO TABS
750.0000 mg | ORAL_TABLET | Freq: Four times a day (QID) | ORAL | Status: DC
  Administered 2024-06-18 – 2024-06-21 (×11): 750 mg via ORAL
  Filled 2024-06-18 (×11): qty 1

## 2024-06-18 MED ORDER — ALUM & MAG HYDROXIDE-SIMETH 200-200-20 MG/5ML PO SUSP
30.0000 mL | ORAL | Status: DC | PRN
  Administered 2024-06-23 – 2024-06-24 (×2): 30 mL via ORAL
  Filled 2024-06-18 (×2): qty 30

## 2024-06-18 MED ORDER — B COMPLEX-C PO TABS
1.0000 | ORAL_TABLET | Freq: Every day | ORAL | Status: DC
  Administered 2024-06-19 – 2024-06-29 (×11): 1 via ORAL
  Filled 2024-06-18 (×11): qty 1

## 2024-06-18 MED ORDER — FAMOTIDINE 20 MG PO TABS
40.0000 mg | ORAL_TABLET | Freq: Every day | ORAL | Status: DC
  Administered 2024-06-19 – 2024-06-29 (×11): 40 mg via ORAL
  Filled 2024-06-18 (×11): qty 2

## 2024-06-18 MED ORDER — TRIAMCINOLONE ACETONIDE 0.1 % EX CREA: 1.0000 | TOPICAL_CREAM | Freq: Two times a day (BID) | CUTANEOUS | Status: DC | PRN

## 2024-06-18 MED ORDER — HYDROCODONE-ACETAMINOPHEN 10-325 MG PO TABS
1.0000 | ORAL_TABLET | Freq: Two times a day (BID) | ORAL | Status: DC
  Administered 2024-06-19 – 2024-06-29 (×22): 1 via ORAL
  Filled 2024-06-18 (×22): qty 1

## 2024-06-18 MED ORDER — CALCIUM CARBONATE ANTACID 500 MG PO CHEW: 1.0000 | CHEWABLE_TABLET | Freq: Three times a day (TID) | ORAL | Status: DC | PRN

## 2024-06-18 MED ORDER — HYDROCODONE-ACETAMINOPHEN 5-325 MG PO TABS
1.0000 | ORAL_TABLET | ORAL | Status: DC | PRN
  Administered 2024-06-18 – 2024-06-24 (×6): 1 via ORAL
  Filled 2024-06-18 (×7): qty 1

## 2024-06-18 MED ORDER — COPPER GLUCONATE 2 MG PO TABS: 2.0000 mg | ORAL_TABLET | Freq: Every day | ORAL | Status: DC

## 2024-06-18 MED ORDER — MENTHOL 3 MG MT LOZG: 1.0000 | LOZENGE | OROMUCOSAL | Status: DC | PRN

## 2024-06-18 MED ORDER — GABAPENTIN 100 MG PO CAPS
100.0000 mg | ORAL_CAPSULE | Freq: Two times a day (BID) | ORAL | Status: DC
Start: 2024-06-18 — End: 2024-06-29
  Administered 2024-06-18 – 2024-06-29 (×22): 100 mg via ORAL
  Filled 2024-06-18 (×22): qty 1

## 2024-06-18 MED ORDER — ENOXAPARIN SODIUM 30 MG/0.3ML IJ SOSY
30.0000 mg | PREFILLED_SYRINGE | Freq: Two times a day (BID) | INTRAMUSCULAR | Status: DC
  Administered 2024-06-18 – 2024-06-29 (×22): 30 mg via SUBCUTANEOUS
  Filled 2024-06-18 (×22): qty 0.3

## 2024-06-18 MED ORDER — DIPHENHYDRAMINE HCL 25 MG PO CAPS
25.0000 mg | ORAL_CAPSULE | Freq: Four times a day (QID) | ORAL | Status: DC | PRN
  Administered 2024-06-18 – 2024-06-20 (×3): 25 mg via ORAL
  Filled 2024-06-18 (×3): qty 1

## 2024-06-18 MED ORDER — MELATONIN 5 MG PO TABS
5.0000 mg | ORAL_TABLET | Freq: Every evening | ORAL | Status: DC | PRN
  Administered 2024-06-18 – 2024-06-19 (×2): 5 mg via ORAL
  Filled 2024-06-18 (×2): qty 1

## 2024-06-18 MED ORDER — BISACODYL 10 MG RE SUPP: 10.0000 mg | Freq: Every day | RECTAL | Status: DC | PRN

## 2024-06-18 MED ORDER — PANTOPRAZOLE SODIUM 40 MG PO TBEC
40.0000 mg | DELAYED_RELEASE_TABLET | Freq: Two times a day (BID) | ORAL | Status: DC
  Administered 2024-06-18 – 2024-06-29 (×22): 40 mg via ORAL
  Filled 2024-06-18 (×22): qty 1

## 2024-06-18 MED ORDER — BUDESON-GLYCOPYRROL-FORMOTEROL 160-9-4.8 MCG/ACT IN AERO
2.0000 | INHALATION_SPRAY | Freq: Two times a day (BID) | RESPIRATORY_TRACT | Status: DC
  Administered 2024-06-18 – 2024-06-29 (×21): 2 via RESPIRATORY_TRACT
  Filled 2024-06-18: qty 5.9

## 2024-06-18 MED ORDER — SPIRONOLACTONE 25 MG PO TABS
25.0000 mg | ORAL_TABLET | Freq: Every day | ORAL | Status: DC
  Administered 2024-06-19 – 2024-06-29 (×11): 25 mg via ORAL
  Filled 2024-06-18 (×11): qty 1

## 2024-06-18 MED ORDER — BUDESON-GLYCOPYRROL-FORMOTEROL 160-9-4.8 MCG/ACT IN AERO: 2.0000 | INHALATION_SPRAY | Freq: Two times a day (BID) | RESPIRATORY_TRACT | Status: DC

## 2024-06-18 MED ORDER — PHENOL 1.4 % MT LIQD: 1.0000 | OROMUCOSAL | Status: DC | PRN

## 2024-06-18 MED ORDER — VITAMIN D (ERGOCALCIFEROL) 1.25 MG (50000 UNIT) PO CAPS
50000.0000 [IU] | ORAL_CAPSULE | ORAL | Status: DC
  Administered 2024-06-18 – 2024-06-25 (×2): 50000 [IU] via ORAL
  Filled 2024-06-18 (×2): qty 1

## 2024-06-18 MED ORDER — BENEPROTEIN PO POWD
1.0000 | Freq: Three times a day (TID) | ORAL | Status: DC
  Administered 2024-06-18 – 2024-06-29 (×33): 6 g via ORAL
  Filled 2024-06-18: qty 227

## 2024-06-18 MED ORDER — ACETAMINOPHEN 325 MG PO TABS
325.0000 mg | ORAL_TABLET | ORAL | Status: DC | PRN
  Administered 2024-06-20 – 2024-06-28 (×6): 650 mg via ORAL
  Filled 2024-06-18 (×6): qty 2

## 2024-06-18 MED ORDER — VITAMIN D 25 MCG (1000 UNIT) PO TABS
1000.0000 [IU] | ORAL_TABLET | Freq: Every day | ORAL | Status: DC
  Administered 2024-06-19 – 2024-06-29 (×11): 1000 [IU] via ORAL
  Filled 2024-06-18 (×11): qty 1

## 2024-06-18 MED ORDER — GUAIFENESIN-DM 100-10 MG/5ML PO SYRP: 5.0000 mL | ORAL_SOLUTION | Freq: Four times a day (QID) | ORAL | Status: DC | PRN

## 2024-06-18 MED ORDER — ZINC SULFATE 220 (50 ZN) MG PO CAPS
220.0000 mg | ORAL_CAPSULE | Freq: Every day | ORAL | Status: DC
  Administered 2024-06-18 – 2024-06-28 (×11): 220 mg via ORAL
  Filled 2024-06-18 (×11): qty 1

## 2024-06-18 MED ORDER — POLYVINYL ALCOHOL 1.4 % OP SOLN: 1.0000 [drp] | OPHTHALMIC | Status: DC | PRN

## 2024-06-18 MED ORDER — ONDANSETRON HCL 4 MG PO TABS
4.0000 mg | ORAL_TABLET | Freq: Three times a day (TID) | ORAL | Status: DC | PRN
  Administered 2024-06-27: 4 mg via ORAL
  Filled 2024-06-18: qty 1

## 2024-06-18 MED ORDER — JUVEN PO PACK
1.0000 | PACK | Freq: Two times a day (BID) | ORAL | Status: DC
  Administered 2024-06-18 – 2024-06-28 (×20): 1 via ORAL
  Filled 2024-06-18 (×17): qty 1

## 2024-06-18 MED ORDER — ALBUTEROL SULFATE (2.5 MG/3ML) 0.083% IN NEBU: 2.5000 mg | INHALATION_SOLUTION | Freq: Four times a day (QID) | RESPIRATORY_TRACT | Status: DC | PRN

## 2024-06-18 MED ORDER — HYDROCODONE-ACETAMINOPHEN 10-325 MG PO TABS
1.0000 | ORAL_TABLET | ORAL | Status: DC | PRN
  Administered 2024-06-19 – 2024-06-29 (×22): 1 via ORAL
  Filled 2024-06-18 (×24): qty 1

## 2024-06-18 MED ORDER — LEVOTHYROXINE SODIUM 25 MCG PO TABS
125.0000 ug | ORAL_TABLET | Freq: Every day | ORAL | Status: DC
  Administered 2024-06-19 – 2024-06-29 (×11): 125 ug via ORAL
  Filled 2024-06-18 (×11): qty 1

## 2024-06-18 MED ORDER — ADULT MULTIVITAMIN W/MINERALS CH
1.0000 | ORAL_TABLET | Freq: Every day | ORAL | Status: DC
  Administered 2024-06-19 – 2024-06-29 (×11): 1 via ORAL
  Filled 2024-06-18 (×11): qty 1

## 2024-06-18 MED ORDER — ALBUTEROL SULFATE HFA 108 (90 BASE) MCG/ACT IN AERS: 2.0000 | INHALATION_SPRAY | Freq: Four times a day (QID) | RESPIRATORY_TRACT | Status: DC | PRN

## 2024-06-18 NOTE — Plan of Care (Signed)

## 2024-06-18 NOTE — Progress Notes (Signed)
 Physical Therapy Treatment Patient Details Name: Kimberly [name] MRN: [id number] DOB: 1956-05-27 Today's Date: 06/18/2024   History of Present Illness 68 y.o. female who presented 06/08/24 via EMS after a fall. Cervical MRI negative; T11/12 multi column Chance type fracture (7/18 ORIF T11-L1); L femur fx (7/18 ORIF) PMH significant for CAD, CHF, COPD, Cirrhosis, Fibromyalgia, HTN, HLD, Hypothyroidism, OSA, Pulm HTN, Seizures, CVA.    PT Comments  Pt tolerates treatment well, improving overall activity tolerance and initiating stair training. Pt continues to demonstrate good knowledge of back and WB restrictions. PT will continue to follow in an effort to improve activity tolerance and to restore independence in mobility. Patient will benefit from intensive inpatient follow-up therapy, >3 hours/day.    If plan is discharge home, recommend the following: A lot of help with bathing/dressing/bathroom;Assistance with cooking/housework;Assist for transportation;Help with stairs or ramp for entrance;A little help with walking and/or transfers   Can travel by private vehicle        Equipment Recommendations  None recommended by PT    Recommendations for Other Services       Precautions / Restrictions Precautions Precautions: Back Precaution Booklet Issued: Yes (comment) Recall of Precautions/Restrictions: Intact Precaution/Restrictions Comments: pt recalls all back precautions Required Braces or Orthoses: Spinal Brace Spinal Brace: Lumbar corset;Applied in sitting position Restrictions Weight Bearing Restrictions Per Provider Order: Yes LLE Weight Bearing Per Provider Order: Touchdown weight bearing     Mobility  Bed Mobility Overal bed mobility: Needs Assistance Bed Mobility: Rolling, Sidelying to Sit, Sit to Supine Rolling: Contact guard assist Sidelying to sit: Contact guard assist, Used rails, HOB elevated   Sit to supine: Min assist        Transfers Overall transfer  level: Needs assistance Equipment used: Rolling walker (2 wheels) Transfers: Sit to/from Stand Sit to Stand: Contact guard assist                Ambulation/Gait Ambulation/Gait assistance: Contact guard assist Gait Distance (Feet): 20 Feet (20' x 3 trials) Assistive device: Rolling walker (2 wheels) Gait Pattern/deviations: Step-to pattern Gait velocity: reduced Gait velocity interpretation: <1.31 ft/sec, indicative of household ambulator   General Gait Details: slowed step-to gait, reduced stance time on L toes in congruence with TDWB restrictions   Stairs Stairs: Yes Stairs assistance: Min assist Stair Management: No rails, With walker, Backwards Number of Stairs: 1     Wheelchair Mobility     Tilt Bed    Modified Rankin (Stroke Patients Only)       Balance Overall balance assessment: Needs assistance Sitting-balance support: No upper extremity supported, Feet supported Sitting balance-Leahy Scale: Good     Standing balance support: Bilateral upper extremity supported, Reliant on assistive device for balance Standing balance-Leahy Scale: Poor                              Communication Communication Communication: No apparent difficulties  Cognition Arousal: Alert Behavior During Therapy: WFL for tasks assessed/performed   PT - Cognitive impairments: No apparent impairments                         Following commands: Intact      Cueing Cueing Techniques: Verbal cues  Exercises      General Comments General comments (skin integrity, edema, etc.): VSS on RA      Pertinent Vitals/Pain Pain Assessment Pain Assessment: Faces Faces Pain Scale: Hurts even more Pain  Location: back and LLE Pain Descriptors / Indicators: Aching Pain Intervention(s): Monitored during session    Home Living                          Prior Function            PT Goals (current goals can now be found in the care plan section)  Acute Rehab PT Goals Patient Stated Goal: to improve ambulation tolerance Progress towards PT goals: Progressing toward goals    Frequency    Min 3X/week      PT Plan      Co-evaluation              AM-PAC PT 6 Clicks Mobility   Outcome Measure  Help needed turning from your back to your side while in a flat bed without using bedrails?: A Little Help needed moving from lying on your back to sitting on the side of a flat bed without using bedrails?: A Little Help needed moving to and from a bed to a chair (including a wheelchair)?: A Little Help needed standing up from a chair using your arms (e.g., wheelchair or bedside chair)?: A Little Help needed to walk in hospital room?: A Little Help needed climbing 3-5 steps with a railing? : Total 6 Click Score: 16    End of Session Equipment Utilized During Treatment: Gait belt;Back brace Activity Tolerance: Patient tolerated treatment well Patient left: in bed;with call bell/phone within reach Nurse Communication: Mobility status PT Visit Diagnosis: Other abnormalities of gait and mobility (R26.89);History of falling (Z91.81);Muscle weakness (generalized) (M62.81)     Time: 0825-0900 PT Time Calculation (min) (ACUTE ONLY): 35 min  Charges:    $Gait Training: 23-37 mins PT General Charges $$ ACUTE PT VISIT: 1 Visit                     Bernardino JINNY Ruth, PT, DPT Acute Rehabilitation Office [phone]    Bernardino JINNY Ruth 06/18/2024, 10:35 AM

## 2024-06-18 NOTE — Discharge Summary (Signed)
 Patient ID: Kimberly [name] [id number] 1956/01/03 68 y.o.  Admit date: 06/08/2024 Discharge date: 06/18/2024  Admitting Diagnosis: GLF 7/18 L femur fx  Presacral hematoma with possible occult sacral fx  T11-T12 fx   Neck pain  L elbow pain R shoulder pain R knee pain  Hx CAD Hx CHF Hx COPD Hx Cirrhosis Hx Fibromyalgia Hx HTN  Hx HLD  Hx Hypothyroidism Hx OSA  Hx Pulm HTN Hx Seizures Hx CVA   Hx duodenal switch  Discharge Diagnosis Patient Active Problem List   Diagnosis Date Noted   Thoracic spine fracture (HCC) 06/08/2024   Copper  deficiency 04/30/2024   Vitamin B12 deficiency 04/30/2024   RUQ pain 07/19/2023   Family history of colon cancer 06/28/2023   Closed fracture of head of right humerus 03/23/2023   Blood in stool 02/12/2023   Closed fracture of right proximal humerus 02/11/2023   Left patella fracture 02/11/2023   Accidental fall 02/11/2023   GI bleed 02/11/2023   Nasal fracture 02/11/2023   Thrombocytopenia (HCC) 02/11/2023   History of cirrhosis 02/11/2023   Hemorrhoids 01/17/2023   Gastroesophageal reflux disease without esophagitis 01/17/2023   Diarrhea 05/17/2022   Lower abdominal pain 05/17/2022   Nausea without vomiting 05/17/2022   Unilateral primary osteoarthritis, left knee 02/18/2022   IBS (irritable bowel syndrome) 06/08/2021   Chronic diarrhea 06/08/2021   Irritable bowel syndrome with diarrhea 06/08/2021   Quadriceps weakness 03/19/2021   S/P total knee arthroplasty, right 12/18/2020   Morbid obesity (HCC) 02/08/2018   Pancytopenia (HCC) 04/09/2017   Liver cirrhosis secondary to NASH (HCC) 04/09/2017   DM type 2 causing vascular disease (HCC) 04/06/2017   Essential hypertension 04/06/2017   OSA (obstructive sleep apnea) 04/06/2017   Asthma in adult 04/06/2017   Obesity (BMI 30-39.9) 04/06/2017   Primary osteoarthritis of both hands 10/26/2016   Primary osteoarthritis of both feet 10/26/2016   COPD (chronic obstructive  pulmonary disease) (HCC) 09/27/2016   Acquired hypothyroidism 09/27/2016   Vertebral compression fracture (HCC) 09/27/2016   Fibromyalgia 09/27/2016   Restless leg syndrome 09/27/2016   Lumbar radiculitis 10/01/2014   Nerve pain 08/30/2014   Lumbar stenosis with neurogenic claudication 08/28/2014   Spondylosis of lumbar region without myelopathy or radiculopathy 08/23/2014   Lumbosacral stenosis with neurogenic claudication 08/23/2014   Vaginal discharge 06/22/2014   Chest pain 06/21/2014   Spinal stenosis 08/23/2011   Obstructive sleep apnea 08/23/2011   Diabetes (HCC) 08/12/2010   Coronary atherosclerosis 08/12/2010   Chronic diastolic heart failure (HCC) 08/12/2010   Mixed hyperlipidemia 06/23/2009   Overweight 06/23/2009   Essential hypertension, benign 06/23/2009   CHEST PAIN-UNSPECIFIED 06/23/2009  GLF 7/18 L femur fx  Presacral hematoma with possible occult sacral fx  T11-T12 fx   Neck pain  L elbow pain R shoulder pain R knee pain  Hx CAD Hx CHF Hx COPD Hx Cirrhosis Hx Fibromyalgia Hx HTN  Hx HLD  Hx Hypothyroidism Hx OSA  Hx Pulm HTN Hx Seizures Hx CVA   Hx duodenal switch  Consultants Dr. Debby, NSGY Dr. Celena, ortho   Reason for Admission: Kimberly [name] is a 68 y.o. female who presented via EMS after a fall.  Patient reports that she was recently started on gabapentin  and Ambien  and took this last night.  She was try to mobilize to the bathroom and she became lightheaded and when she thought she was sitting on the commode she missed, falling and leaning against the wall and onto the floor.  She did  hit her head.  No loss of consciousness.  She complains of pain in her back, left hip and left leg.  She reports she does have lower extremity numbness/tingling at baseline but this seems to be worse on the left lower extremity now.  She also reports that she is having some neck pain as well as numbness and tingling of the upper extremities, worse on the left  that is different from her baseline carpal tunnel.  She denies other complaints.  She has been able to void since arrival without gross hematuria. She is not on blood thinners.   Procedures Dr. Celena, 7/18 1.  CEPHALOMEDULLARY NAILING OF THE LEFT FEMUR with [employer] Affixus 9 x 360  mm statically locked recon nail. 2.  OPEN REDUCTION INTERNAL FIXATION OF THE LEFT FEMUR  Dr. Debby, 7/18 1.  Open reduction of T11-12 fracture 2.  Segmental instrumentation with pedicle screw and rod construct T11-T12-L1 3. Intraoperative neuronavigation with Stealth 4. Intraoperative CT scan  Hospital Course:  GLF 7/18  L femur fx  s/p IMN ([facility])- TDWB LLE  Presacral hematoma with possible occult sacral fx Ortho consulted. Trend hgb- see below  T11-T12 fx s/p T11-L1 posterior percutaneous instrumentation Cathy). TLSO brace when out of bed  Neck pain  CT neg for fx, MRI with degenerative disc disease at C6-7 with mild-to-moderate central spinal canal stenosis and mild left neural foraminal stenosis, no acute injury  L elbow pain xray neg  R shoulder pain xray neg  R knee pain xray neg  ABL anemia  hgb stable at 9.5  Hx Osteoporosis Hx CAD Hx CHF Hx COPD Hx Cirrhosis Hx Fibromyalgia Hx HTN - reports resolved since she had her duodenal switch Hx HLD -  reports resolved since she had her duodenal switch Hx Hypothyroidism Hx OSA -  reports no longer on CPAP since she had her duodenal switch Hx Pulm HTN Hx Seizures Hx CVA   Hx duodenal switch - avoid NSAIDs, multi. CBG monitoring given she reports episodes of hypoglycemia since surgery, but sugars look good.  The patient worked with therapies during her admission that recommended CIR.  This was initially denied, but appeal with approval was completed.  She was stable on HD 10 for DC to CIR  Physical Exam: Gen: comfortable, no distress Neuro: follows commands, alert, communicative HEENT: PERRL Neck: supple CV: RRR Pulm: unlabored  breathing on RA Abd: soft, NT Extr: wwp, no edema, L thigh dressing clean and dry with surrounding ecchymosis  Medications: Per CIR upon arrival    Follow-up Information     Celena Sharper, MD. Call in 2 week(s).   Specialty: Orthopedic Surgery Contact information: [address] [place] [address] [phone]         Debby Dorn MATSU, MD. Call in 2 week(s).   Specialty: Neurosurgery Contact information: [address] [place] [address] [phone]         Trudy Vaughn FALCON, MD Follow up.   Specialty: Internal Medicine Why: As needed Contact information: [address] [place] [address] [phone]                 Signed: Burnard Banter, [facility] 06/18/2024, 9:46 AM Please see Amion for pager number during day hours 7:00am-4:30pm, 7-11:30am on Weekends

## 2024-06-18 NOTE — H&P (Shared)
 Physical Medicine and Rehabilitation Admission H&P    Chief Complaint  Patient presents with   Fall with trauma and functional deficits    HPI:  Kimberly [name] is a 68 year old female with history of non-obst CAD, COPD, cirrhosis of liver secondary to NASH, multiple back surgeries with lumbar radiculopathy, Bilateral quad weakness s/p R-TKR and pending replacement on the left, obesity s/p duodenal switch with resultant N/V/B/B incontinence, thrombocytopenia, macula degeneration (worse vision at nights) who was admitted on 06/08/24 after fall where she struck her head and onset of left hip and back pain with worsening of neuropathy LLE as well as numbness/tingling BUE. She had recently been started on gabapentin  and ambien  per reports. She was found to have displaced proximal left femur fracture, presacral hematoma with possible unusual Chance type Fx involving T 12 vertebral body and spinous process, mild to moderate central spinal canal stenosis T10-T11,   chronic DDD C6/7 with mild to moderate central spine stenosis and moderate degenerative changes right shoulder. She was taken to OR for ORIF with IM nailing left femur by Dr. Celena and open reduction with pedicle screw/rod construct T11-T12- L1 by Dr. Debby on the same day. Post op to wear TLSO when out of bed and is TTWB on LLE with recommendation of 2 week post op follow up on hip for imaging/sutures.    Hospital course significant for constipation which has resolved with addition of laxatives. BS being monitored due to history of hypoglycemia since DSS and follow up CBC showed pancytopenia. BMET not checked since admission. Vitamin D  deficiency noted @ 19.97. PT/OT was consulted and has been working with patient who is showing improvement in activity tolerance. She requires CGA to min assist with ADLS and mobility. She was independent PTA and CIR recommended due to functional decline.     Review of Systems  Constitutional:  Negative  for chills and fever.  HENT:  Negative for hearing loss.   Eyes:  Positive for blurred vision (peripheral vision blurry  and at times has black spots when she first gets up).  Cardiovascular:  Negative for chest pain and palpitations.  Gastrointestinal:  Positive for constipation. Negative for nausea.  Musculoskeletal:  Positive for back pain and myalgias (muscle spasms.).  Skin:  Negative for rash.  Neurological:  Positive for sensory change and weakness. Negative for dizziness and headaches.  Psychiatric/Behavioral:  The patient is nervous/anxious.     Past Medical History:  Diagnosis Date   Anemia    PMH: as a child   Arthritis    Asthma    CAD (coronary artery disease)    nonobstructive by cath, 6/08 (false positive Cardiolite ) normal stress echo, 7/11   CHF (congestive heart failure) (HCC)    Chronic back pain    Chronic bronchitis (HCC)    Cirrhosis (HCC)    Complication of anesthesia    had an asthma attack when woke up from procedure   Complication of anesthesia    asthma attack with one surgery   COPD (chronic obstructive pulmonary disease) (HCC)    Degenerative joint disease    Depression    DJD (degenerative joint disease)    Fatty liver    Fibromyalgia    GERD (gastroesophageal reflux disease)    Glaucoma    Headache(784.0)    Heart failure, diastolic, chronic (HCC)    Heart murmur    PMH:As a child only   Hernia of abdominal cavity    upper and lower hernia  History of hiatal hernia    HTN (hypertension)    Hypertension    Hypothyroidism    IBS (irritable bowel syndrome)    Morbid obesity (HCC)    Neuropathy    associated with diabetes   Obstructive sleep apnea    Pancreatitis    Pinched nerve in neck    Pneumonia    as a child   PONV (postoperative nausea and vomiting)    Pulmonary hypertension (HCC)    Rheumatic fever    PMH: as a child   Seizures (HCC)    PMH: only as a child   Seizures (HCC)    in childhood   Shortness of breath     Sleep apnea    Spinal stenosis    Stroke (HCC)    12/09/2013    Past Surgical History:  Procedure Laterality Date   ABDOMINAL HYSTERECTOMY     ABDOMINAL HYSTERECTOMY     partial hysterectomy   APPENDECTOMY     BACK SURGERY     BACK SURGERY     disc surgery with complications and damage to right side   BARIATRIC SURGERY     DS in June 2020   BILATERAL KNEE ARTHROSCOPY  2000, 2009   BIOPSY  06/16/2021   Procedure: BIOPSY;  Surgeon: Eartha Angelia Sieving, MD;  Location: [facility];  Service: Gastroenterology;;   BREAST CYST EXCISION Left    CARDIAC CATHETERIZATION     2009   CATARACT EXTRACTION W/ INTRAOCULAR LENS  IMPLANT, BILATERAL     CHOLECYSTECTOMY  11/22/1992   CHOLECYSTECTOMY     COLONOSCOPY N/A 08/01/2013   Procedure: COLONOSCOPY;  Surgeon: Claudis RAYMOND Rivet, MD;  Location: [facility];  Service: Endoscopy;  Laterality: N/A;  930   COLONOSCOPY N/A 08/03/2017   Procedure: COLONOSCOPY;  Surgeon: Rivet Claudis RAYMOND, MD;  Location: [facility];  Service: Endoscopy;  Laterality: N/A;  12:00   COLONOSCOPY WITH PROPOFOL  N/A 06/28/2023   Procedure: COLONOSCOPY WITH PROPOFOL ;  Surgeon: Eartha Angelia Sieving, MD;  Location: [facility];  Service: Gastroenterology;  Laterality: N/A;  12:30pm;asa 3   CYST EXCISION     Left breast   ESOPHAGOGASTRODUODENOSCOPY (EGD) WITH PROPOFOL  N/A 06/16/2021   Procedure: ESOPHAGOGASTRODUODENOSCOPY (EGD) WITH PROPOFOL ;  Surgeon: Eartha Angelia Sieving, MD;  Location: [facility];  Service: Gastroenterology;  Laterality: N/A;  12:50   ESOPHAGOGASTRODUODENOSCOPY (EGD) WITH PROPOFOL  N/A 06/28/2023   Procedure: ESOPHAGOGASTRODUODENOSCOPY (EGD) WITH PROPOFOL ;  Surgeon: Eartha Angelia Sieving, MD;  Location: [facility];  Service: Gastroenterology;  Laterality: N/A;  12:30pm;asa 3   EXCISIONAL HEMORRHOIDECTOMY     EYE SURGERY  11/22/1965   EYE SURGERY     bilateral cataract removal with lens implants   EYE SURGERY     growth removed  from right eye lid   EYE SURGERY     bilateral eye surgery age 68 tto straighten eyes'   FEMUR IM NAIL Left 06/08/2024   Procedure: INSERTION, INTRAMEDULLARY ROD, FEMUR;  Surgeon: Celena Sharper, MD;  Location: [facility];  Service: Orthopedics;  Laterality: Left;   heel (other)  11/23/2003   HEEL SPUR SURGERY     Right heel - growth removal and rebuilding of heel   HEMORRHOID SURGERY     HERNIA REPAIR     KNEE ARTHROSCOPY Bilateral    LUMBAR LAMINECTOMY/DECOMPRESSION MICRODISCECTOMY Right 08/23/2014   Procedure: LUMBAR LAMINECTOMY/DECOMPRESSION MICRODISCECTOMY 1 LEVEL;  Surgeon: Victory DELENA Gunnels, MD;  Location: [facility];  Service: Neurosurgery;  Laterality: Right;  LUMBAR LAMINECTOMY/DECOMPRESSION MICRODISCECTOMY 1 LEVEL LUMBAR 2-3   LUMBAR PERCUTANEOUS PEDICLE SCREW 2 LEVEL N/A 06/08/2024   Procedure: THORACIC ELEVEN-LUMBAR ONE PERCUTANEOUS PEDICLE SCREW;  Surgeon: Debby Dorn MATSU, MD;  Location: [facility];  Service: Neurosurgery;  Laterality: N/A;  T11-L1 Percutaneous Fusion   ORIF FEMUR FRACTURE Left 06/08/2024   Procedure: OPEN REDUCTION INTERNAL FIXATION FEMORAL SHAFT FRACTURE;  Surgeon: Celena Sharper, MD;  Location: [facility] OR;  Service: Orthopedics;  Laterality: Left;   ovaries removed  11/22/2001   PARTIAL HYSTERECTOMY  11/23/1979   SHOULDER ARTHROSCOPY WITH DISTAL CLAVICLE RESECTION Left 08/06/2022   Procedure: SHOULDER ARTHROSCOPY WITH DISTAL CLAVICLE RESECTION;  Surgeon: Sharl Selinda Dover, MD;  [facility] OR;  Service: Orthopedics;  Laterality: Left;   SHOULDER ARTHROSCOPY WITH ROTATOR CUFF REPAIR Left 08/06/2022   Procedure: SHOULDER ARTHROSCOPY WITH ROTATOR CUFF REPAIR;  Surgeon: Sharl Selinda Dover, MD;  [facility] OR;  Service: Orthopedics;  Laterality: Left;   SHOULDER ARTHROSCOPY WITH SUBACROMIAL DECOMPRESSION Left 08/06/2022   Procedure: SHOULDER ARTHROSCOPY WITH SUBACROMIAL DECOMPRESSIONWITH EXTENSIVE DEBRIDEMENT;  Surgeon: Sharl Selinda Dover, MD;  Location: [facility] OR;   Service: Orthopedics;  Laterality: Left;   SPHINCTEROTOMY     spinahatomy  11/22/1990   TOTAL KNEE ARTHROPLASTY Right 11/05/2020   Procedure: RIGHT TOTAL KNEE ARTHROPLASTY;  Surgeon: Barbarann Oneil BROCKS, MD;  Location: [facility];  Service: Orthopedics;  Laterality: Right;   TUBAL LIGATION  11/22/1973   TUBAL LIGATION      Family History  Problem Relation Age of Onset   Colon cancer Brother    Cancer Other    Heart failure Other    Breast cancer Neg Hx     Social History:  reports that she has never smoked. She has never been exposed to tobacco smoke. She has never used smokeless tobacco. She reports that she does not drink alcohol  and does not use drugs.   Allergies  Allergen Reactions   Aleve [Naproxen] Anaphylaxis, Swelling and Other (See Comments)    Throat closes   Cinnamon Anaphylaxis    Reaction caused by a medication interaction.   Other Shortness Of Breath and Other (See Comments)    Local anesthesia - triggers asthma attacks  08/06/22 tolerated amide local anesthetic in peripheral nerve block.   Roxicodone  [Oxycodone ] Other (See Comments)    Over sedation   Atrovent Nasal Spray [Ipratropium] Other (See Comments)    Epistaxis    Feldene [Piroxicam] Nausea Only   Flonase [Fluticasone ] Other (See Comments)    Epistaxis    Lyrica [Pregabalin] Other (See Comments)    Altered mental status for prolonged period   Nsaids Other (See Comments)    Hx of gastic bypass - told to avoid.   Phenergan [Promethazine Hcl] Other (See Comments)    Triggers asthma attacks   Sulfa Antibiotics Other (See Comments)    Yeast infection   Zohydro Er [Hydrocodone  Bitartrate Er] Other (See Comments)    Over sedation with IR and ER formulations   Latex Rash and Other (See Comments)    Skin blisters   Tape Rash and Other (See Comments)    Do not use adhesive tape; okay to use paper tape. 08/06/22 tolerated adhesive tape for peripheral IV    Medications Prior to Admission  Medication Sig Dispense  Refill   acetaminophen  (TYLENOL ) 500 MG tablet Take 500 mg by mouth 2 (two) times daily as needed for moderate pain (pain score 4-6), fever or headache.     albuterol  (VENTOLIN  HFA) 108 (90 Base) MCG/ACT  inhaler Inhale 2 puffs into the lungs 4 (four) times daily as needed for shortness of breath.     Cholecalciferol  (VITAMIN D -3 PO) Take 1 capsule by mouth daily.     Copper  Gluconate 2 MG TABS Take 2 mg by mouth daily with supper.     cyclobenzaprine  (FLEXERIL ) 10 MG tablet Take 10 mg by mouth every 8 (eight) hours as needed for muscle spasms.     denosumab  (PROLIA ) 60 MG/ML SOSY injection Inject 60 mg into the skin every 6 (six) months.     famotidine  (PEPCID ) 40 MG tablet Take 40 mg by mouth daily.     furosemide  (LASIX ) 20 MG tablet Take 20 mg by mouth daily.     gabapentin  (NEURONTIN ) 100 MG capsule Take 100 mg by mouth 2 (two) times daily with a meal.     levothyroxine  (SYNTHROID ) 125 MCG tablet Take 125 mcg by mouth daily before breakfast.     Multiple Vitamins-Minerals (MULTIVITAMIN WOMEN 50+) TABS Take 1 tablet by mouth daily.     omeprazole  (PRILOSEC) 40 MG capsule Take 1 capsule (40 mg total) by mouth daily. 90 capsule 3   ondansetron  (ZOFRAN ) 4 MG tablet TAKE 1 TABLET BY MOUTH EVERY 8 HOURS AS NEEDED FOR NAUSEA OR VOMITING 30 tablet 1   spironolactone  (ALDACTONE ) 25 MG tablet Take 25 mg by mouth daily.     TRELEGY ELLIPTA 100-62.5-25 MCG/ACT AEPB Inhale 1 puff into the lungs every other day.     triamcinolone  cream (KENALOG ) 0.1 % Apply 1 Application topically 2 (two) times daily as needed (skin irritation).     zolpidem  (AMBIEN ) 5 MG tablet Take 5 mg by mouth at bedtime.        Home: Home Living Family/patient expects to be discharged to:: Private residence Living Arrangements: Spouse/significant other Available Help at Discharge: Family, Available 24 hours/day Type of Home: House Home Access: Stairs to enter Secretary/administrator of Steps: 1 Entrance Stairs-Rails:  None Home Layout: One level Bathroom Shower/Tub: Engineer, manufacturing systems: Standard Bathroom Accessibility: Yes Home Equipment: Agricultural consultant (2 wheels), [employer] - single point   Functional History: Prior Function Prior Level of Function : Independent/Modified Independent, Needs assist Mobility Comments: ambulates with SPC ADLs Comments: does not drive, recent assist with medication management due to difficulty reading labels  Functional Status:  Mobility: Bed Mobility Overal bed mobility: Needs Assistance Bed Mobility: Rolling, Sidelying to Sit Rolling: Supervision Sidelying to sit: Contact guard assist, Used rails Sit to sidelying: Min assist, Used rails General bed mobility comments: return to bed; min assist to raise legs and max cues for sequencing to maintain back precautions Transfers Overall transfer level: Needs assistance Equipment used: Rolling walker (2 wheels) Transfers: Sit to/from Stand Sit to Stand: Contact guard assist Bed to/from chair/wheelchair/BSC transfer type:: Step pivot Stand pivot transfers: Min assist, +2 safety/equipment General transfer comment: verbal cues for hand placement Ambulation/Gait Ambulation/Gait assistance: Contact guard assist Gait Distance (Feet): 15 Feet (15' x 2 trials) Assistive device: Rolling walker (2 wheels) Gait Pattern/deviations: Step-to pattern General Gait Details: pt with slowed step-to gait, maintains TDWB through toes on L foot well Gait velocity: reduced Gait velocity interpretation: <1.31 ft/sec, indicative of household ambulator    ADL: ADL Overall ADL's : Needs assistance/impaired Eating/Feeding: Set up, Sitting Grooming: Set up, Sitting Upper Body Bathing: Set up, Sitting Lower Body Bathing: Maximal assistance, Sit to/from stand Upper Body Dressing : Minimal assistance, Sitting Upper Body Dressing Details (indicate cue type and reason): donning TLSO Lower Body  Dressing: Maximal assistance, Sit to/from  stand Toilet Transfer: Contact guard assist, Rolling walker (2 wheels) Toilet Transfer Details (indicate cue type and reason): simulated bed > chair Toileting- Clothing Manipulation and Hygiene: Contact guard assist, Minimal assistance, Sit to/from stand Functional mobility during ADLs: Contact guard assist, Rolling walker (2 wheels)  Cognition: Cognition Orientation Level: Oriented X4 Cognition Arousal: Alert Behavior During Therapy: WFL for tasks assessed/performed   Blood pressure (!) 96/54, pulse 70, temperature 98.3 F (36.8 C), temperature source Oral, resp. rate 18, height 5' (1.524 m), weight 63.3 kg, SpO2 96%. Physical Exam Vitals and nursing note reviewed.  Constitutional:      Appearance: She is underweight.     Comments: Anxious appearing  Musculoskeletal:     Comments: LLE limited by pain--foam dressing on small incisions left lateral thigh as well as linear area of ecchymosis.   Skin:    General: Skin is warm and dry.  Neurological:     Mental Status: She is alert and oriented to person, place, and time.     Results for orders placed or performed during the hospital encounter of 06/08/24 (from the past 48 hours)  Glucose, capillary     Status: None   Collection Time: 06/16/24 12:06 PM  Result Value Ref Range   Glucose-Capillary 82 70 - 99 mg/dL    Comment: Glucose reference range applies only to samples taken after fasting for at least 8 hours.  Glucose, capillary     Status: Abnormal   Collection Time: 06/16/24  3:25 PM  Result Value Ref Range   Glucose-Capillary 109 (H) 70 - 99 mg/dL    Comment: Glucose reference range applies only to samples taken after fasting for at least 8 hours.  Glucose, capillary     Status: Abnormal   Collection Time: 06/16/24  7:36 PM  Result Value Ref Range   Glucose-Capillary 117 (H) 70 - 99 mg/dL    Comment: Glucose reference range applies only to samples taken after fasting for at least 8 hours.  Glucose, capillary      Status: Abnormal   Collection Time: 06/16/24 11:22 PM  Result Value Ref Range   Glucose-Capillary 104 (H) 70 - 99 mg/dL    Comment: Glucose reference range applies only to samples taken after fasting for at least 8 hours.  Glucose, capillary     Status: None   Collection Time: 06/17/24  3:38 AM  Result Value Ref Range   Glucose-Capillary 98 70 - 99 mg/dL    Comment: Glucose reference range applies only to samples taken after fasting for at least 8 hours.  Glucose, capillary     Status: Abnormal   Collection Time: 06/17/24  8:03 AM  Result Value Ref Range   Glucose-Capillary 129 (H) 70 - 99 mg/dL    Comment: Glucose reference range applies only to samples taken after fasting for at least 8 hours.  Glucose, capillary     Status: None   Collection Time: 06/17/24 11:49 AM  Result Value Ref Range   Glucose-Capillary 75 70 - 99 mg/dL    Comment: Glucose reference range applies only to samples taken after fasting for at least 8 hours.  Glucose, capillary     Status: None   Collection Time: 06/17/24  4:03 PM  Result Value Ref Range   Glucose-Capillary 86 70 - 99 mg/dL    Comment: Glucose reference range applies only to samples taken after fasting for at least 8 hours.  Glucose, capillary     Status:  Abnormal   Collection Time: 06/17/24  7:24 PM  Result Value Ref Range   Glucose-Capillary 100 (H) 70 - 99 mg/dL    Comment: Glucose reference range applies only to samples taken after fasting for at least 8 hours.  Glucose, capillary     Status: Abnormal   Collection Time: 06/17/24 11:15 PM  Result Value Ref Range   Glucose-Capillary 102 (H) 70 - 99 mg/dL    Comment: Glucose reference range applies only to samples taken after fasting for at least 8 hours.  Glucose, capillary     Status: None   Collection Time: 06/18/24  3:16 AM  Result Value Ref Range   Glucose-Capillary 86 70 - 99 mg/dL    Comment: Glucose reference range applies only to samples taken after fasting for at least 8 hours.   Glucose, capillary     Status: None   Collection Time: 06/18/24  8:01 AM  Result Value Ref Range   Glucose-Capillary 82 70 - 99 mg/dL    Comment: Glucose reference range applies only to samples taken after fasting for at least 8 hours.   No results found.    Blood pressure (!) 96/54, pulse 70, temperature 98.3 F (36.8 C), temperature source Oral, resp. rate 18, height 5' (1.524 m), weight 63.3 kg, SpO2 96%.  Medical Problem List and Plan: 1. Functional deficits secondary to ***  -patient may *** shower  -ELOS/Goals: *** 2.  Antithrombotics: -DVT/anticoagulation:  Pharmaceutical: Lovenox   -antiplatelet therapy: N/A 3. Pain Management:  Hydrocodone  10 mg prn severe pain and 5 mg prn moderate pain. Premedicate prior to therapy--has a lot of anxiety regarding activity tolerance/pain.   --continues to have muscle spasms worse when in chair. Continue robaxin  750 mg tid. May need baclofen  additionally.  4. Mood/Behavior/Sleep: LCSW to follow for evaluation and support.   -antipsychotic agents: N/A. Team to provide ego support.  --melatonin prn for insomnia. Buspar  prn added for anxiety.  5. Neuropsych/cognition: This patient is capable of making decisions on her own behalf. 6. Skin/Wound Care: Routine pressure relief measures.  7. Fluids/Electrolytes/Nutrition: Monitor I/O. Check CMET in am 8. T12 chance Fx s/p fixation: Wear TLSO when out of bed 9. Displaced femur fx s/p ORIF: TTWB with follow up X rays in 2 weeks 10. H/o Multifactorial Neuropathy: Continue gabapentin  BID.   11.  H/o Obesity s/p duodenal switch surgery: NO longer on meds for HTN, DM  and does not have sleep apnea.  --Reports has not been able to afford vitamins--discussed generic --options. --Resume copper . Change Vit C to B complex w/C.  12. H/o dumping syndrome:  Has had bowel and bladder incontinence since DSS.  13. H/o spinal stenosis: Multiple surgery with RLE  14. Thrombocytopenia: Pancytopenia in the past felt  to be due to nutritional/copper  deficiency.   --Platelets recovering from 85 to 118 15. Acute blood loss anemia: Recheck hgb in am 16. Neutropenia: WBC- 6.5-->3.9.  Recheck in am.  17. Vitamin D  deficiency- 19.97: Change cholecalciferol  to ergocalciferol  added.   18. COPD: Hx of cough/second hand smoke exposure. Continue Breztri .  19. Hypotension: Will monitor weights daily. TEDs for edema control.  --D/c lasix . TEDs for edema control LLE. May need binder if orthostatic.  20. Hypothyroid: Continue supplement.    ***  Sharlet GORMAN Schmitz, PA-C 06/18/2024

## 2024-06-18 NOTE — TOC Transition Note (Addendum)
 Transition of Care (TOC) - Discharge Note Rayfield Gobble RN,BSN Inpatient Care Management Unit 4NP (Non Trauma)- RN Case Manager See Treatment Team for direct Phone #   Patient Details  Name: Kimberly [name] MRN: [id number] Date of Birth: 1956/01/23  Transition of Care [facility]) CM/SW Contact:  Gobble Rayfield Hurst, RN Phone Number: 06/18/2024, 11:36 AM   Clinical Narrative:    CM has been notified by [facility] re[facility] liaison that pt has been approved on appeal w/ insurance for CIR admission and bed available for CIR admit today.  Pt stable for transition to [facility].   Unit staff will transport pt once bed assigned.   RNCM will sign off for now as intervention is no longer needed. Please re-consult  if new needs arise, or contact RNCM assigned to treatment team for further questions/concerns.     Final next level of care: IP Rehab Facility Barriers to Discharge: Barriers Resolved   Patient Goals and CMS Choice Patient states their goals for this hospitalization and ongoing recovery are:: rehab and to get better to return home   Choice offered to / list presented to : Patient      Discharge Placement                 [facility]      Discharge Plan and Services Additional resources added to the After Visit Summary for   In-house Referral: Clinical Social Work Discharge Planning Services: CM Consult Post Acute Care Choice: IP Rehab          DME Arranged: N/A DME Agency: NA       HH Arranged: NA HH Agency: NA        Social Drivers of Health (SDOH) Interventions SDOH Screenings   Food Insecurity: Food Insecurity Present (06/09/2024)  Housing: Low Risk  (06/09/2024)  Transportation Needs: No Transportation Needs (06/09/2024)  Utilities: Not At Risk (06/09/2024)  Depression (PHQ2-9): Low Risk  (04/13/2024)  Financial Resource Strain: Low Risk  (06/29/2023)  Recent Concern: Financial Resource Strain - Medium Risk (06/29/2023)  Social Connections:  Moderately Integrated (06/09/2024)  Tobacco Use: Low Risk  (06/08/2024)  Health Literacy: Adequate Health Literacy (08/30/2023)     Readmission Risk Interventions    02/15/2023    9:21 AM  Readmission Risk Prevention Plan  Post Dischage Appt Complete  Medication Screening Complete  Transportation Screening Complete

## 2024-06-18 NOTE — Progress Notes (Signed)
 PMR Admission Coordinator Pre-Admission Assessment   Patient: Kimberly [name] is an 68 y.o., female MRN: [id number] DOB: 1955-12-20 Height: 5' (152.4 cm) Weight: 63.3 kg                                                                                                                                                  Insurance Information HMO: yes    PPO:      PCP:      IPA:      80/20:      OTHER:  PRIMARY: UHC Medicare      Policy#: [id number], Medicare: [id number]      Subscriber: Pt CM Name:       Phone#: [phone]     Fax#: [phone] Pre-Cert#: [id number]  Pt. Won appeal on 06/18/24 and was approved for admit 7/28-06/24/24  Employer:  Benefits:  Phone #:      Name:  Eff Date: 04/22/2024 - 11/21/2024 Deductible: $0 (does not have deductible)  OOP Max: $3,900 ($753.82 met) CIR: $300/day co-pay for days 1-5, $0/day co-pay for days 6+ SNF: $0.00 Copayment per day for days 1-20; $203 Copayment per day for days 21-100 for Medicare-covered care/maximum 100 days/benefit period Outpatient: $20 copay/visit Home Health:  100% coverage; limited by medical necessity DME: 80% coverage; 20% co-insurance Providers: in network  SECONDARY:       Policy#:       Phone#:    Artist:       Phone#:      The Data processing manager" for patients in Inpatient Rehabilitation Facilities with attached "Privacy Act Statement-Health Care Records" was provided and verbally reviewed with: Patient   Emergency Contact Information Contact Information       Name Relation Home Work Mobile    Emmet Spouse [phone]   [phone]    Talbert Ronalee Blades [phone]   [phone]         Other Contacts       Name Relation Home Work Mobile    Hurd,April Sister [phone]             Current Medical History  Patient Admitting Diagnosis: Polytrauma History of Present Illness: Kimberly [name] is a 68 year old female with history of non-obst CAD, COPD, cirrhosis of  liver secondary to NASH, OSA, multiple back surgeries with lumbar radiculopathy, BLE weakness s/p R-TKR and pending replacement on the left, obesity s/p duodenal switch with resultant N/V/B/B incontinence, thrombocytopenia, and fibromyalgia who presented to the [facility] on 06/08/2024 after a fall.  She apparently had started on gabapentin  and Ambien  recently and lost her balance while she was going to the bathroom overnight.The patient sustained a left proximal femoral shaft fracture with significant comminution which required an ORIF and IMN by Dr. Celena on 06/08/2024.  She is touchdown weightbearing on the left lower extremity.  She sustained a presacral hematoma with possible occult sacral fracture, conservative management recommended.  Patient sustained a T11-T12 Chance fracture and was seen by neurosurgery.  Patient underwent open reduction of T11-T12 fracture and T11-L1 posterior percutaneous instrumentation by Dr. Debby on 06/08/2024 as well.  CT of the cervical spine was negative for fracture.  MRI demonstrated degenerative disc disease at C6-C7 with mild to moderate central and foraminal stenosis, conservative care was recommended.  Patient with multiple other areas of pain but these areas were negative for fracture.  Foley was removed on 06/09/2024.  Pt., seen by PT/OT and they recommended CIR to assist return to PLOF.  Glasgow Coma Scale Score: 15   Patient's medical record from [facility] has been reviewed by the rehabilitation admission coordinator and physician.   Past Medical History      Past Medical History:  Diagnosis Date   Anemia      PMH: as a child   Arthritis     Asthma     CAD (coronary artery disease)      nonobstructive by cath, 6/08 (false positive Cardiolite ) normal stress echo, 7/11   CHF (congestive heart failure) (HCC)     Chronic back pain     Chronic bronchitis (HCC)     Cirrhosis (HCC)     Complication of anesthesia      had an  asthma attack when woke up from procedure   Complication of anesthesia      asthma attack with one surgery   COPD (chronic obstructive pulmonary disease) (HCC)     Degenerative joint disease     Depression     DJD (degenerative joint disease)     Fatty liver     Fibromyalgia     GERD (gastroesophageal reflux disease)     Glaucoma     Headache(784.0)     Heart failure, diastolic, chronic (HCC)     Heart murmur      PMH:As a child only   Hernia of abdominal cavity      upper and lower hernia   History of hiatal hernia     HTN (hypertension)     Hypertension     Hypothyroidism     IBS (irritable bowel syndrome)     Morbid obesity (HCC)     Neuropathy      associated with diabetes   Obstructive sleep apnea     Pancreatitis     Pinched nerve in neck     Pneumonia      as a child   PONV (postoperative nausea and vomiting)     Pulmonary hypertension (HCC)     Rheumatic fever      PMH: as a child   Seizures (HCC)      PMH: only as a child   Seizures (HCC)      in childhood   Shortness of breath     Sleep apnea     Spinal stenosis     Stroke (HCC)      12/09/2013          Has the patient had major surgery during 100 days prior to admission? Yes   Family History  family history includes Cancer in an other family member; Colon cancer in her brother; Heart failure in an other family member.     Current Medications   Current Medications    Current Facility-Administered Medications:    albuterol  (PROVENTIL ) (2.5 MG/3ML) 0.083% nebulizer solution 2.5 mg, 2.5 mg, Inhalation, QID  PRN, Paola Dreama SAILOR, MD   budesonide -glycopyrrolate -formoterol  (BREZTRI ) 160-9-4.8 MCG/ACT inhaler 2 puff, 2 puff, Inhalation, BID, Paola Dreama SAILOR, MD, 2 puff at 06/12/24 0807   calcium  carbonate (TUMS - dosed in mg elemental calcium ) chewable tablet 200 mg of elemental calcium , 1 tablet, Oral, TID WC PRN, Ann Fine, MD, 200 mg of elemental calcium  at 06/12/24 1241   Chlorhexidine   Gluconate Cloth 2 % PADS 6 each, 6 each, Topical, Daily, Paola Dreama SAILOR, MD, 6 each at 06/12/24 9073   cholecalciferol  (VITAMIN D3) 25 MCG (1000 UNIT) tablet 5,000 Units, 5,000 Units, Oral, Daily, Signe Cree A, MD, 5,000 Units at 06/12/24 9074   diphenhydrAMINE  (BENADRYL ) capsule 25 mg, 25 mg, Oral, Q6H PRN, Paola Dreama SAILOR, MD, 25 mg at 06/10/24 2210   docusate sodium  (COLACE) capsule 100 mg, 100 mg, Oral, BID, Paola Dreama SAILOR, MD, 100 mg at 06/12/24 9074   enoxaparin  (LOVENOX ) injection 30 mg, 30 mg, Subcutaneous, BID, Tammy Sor, PA-C, 30 mg at 06/12/24 9074   furosemide  (LASIX ) tablet 20 mg, 20 mg, Oral, Daily, Paola Dreama SAILOR, MD, 20 mg at 06/12/24 9074   gabapentin  (NEURONTIN ) capsule 100 mg, 100 mg, Oral, BID WC, Paola Dreama SAILOR, MD, 100 mg at 06/12/24 9074   hydrALAZINE  (APRESOLINE ) injection 10 mg, 10 mg, Intravenous, Q2H PRN, Paola Dreama SAILOR, MD   HYDROcodone -acetaminophen  (NORCO/VICODIN) 5-325 MG per tablet 1 tablet, 1 tablet, Oral, Q4H PRN, Tomlinson, Sara Caylin, PA-C, 1 tablet at 06/09/24 9144   HYDROcodone -acetaminophen  (NORCO/VICODIN) 5-325 MG per tablet 2 tablet, 2 tablet, Oral, Q4H PRN, Tomlinson, Sara Caylin, PA-C, 2 tablet at 06/12/24 1239   levothyroxine  (SYNTHROID ) tablet 125 mcg, 125 mcg, Oral, QAC breakfast, Paola Dreama SAILOR, MD, 125 mcg at 06/12/24 0535   menthol -cetylpyridinium (CEPACOL) lozenge 3 mg, 1 lozenge, Oral, PRN **OR** phenol (CHLORASEPTIC) mouth spray 1 spray, 1 spray, Mouth/Throat, PRN, Johnanna Credit Caylin, PA-C   methocarbamol  (ROBAXIN ) tablet 750 mg, 750 mg, Oral, QID, 750 mg at 06/12/24 0925 **OR** [DISCONTINUED] methocarbamol  (ROBAXIN ) injection 500 mg, 500 mg, Intravenous, Q8H, Lovick, Dreama SAILOR, MD, 500 mg at 06/08/24 0900   metoprolol  tartrate (LOPRESSOR ) injection 5 mg, 5 mg, Intravenous, Q6H PRN, Paola Dreama SAILOR, MD   morphine  (PF) 2 MG/ML injection 1 mg, 1 mg, Intravenous, Q4H PRN, Tammy Sor, PA-C   multivitamin with minerals  tablet 1 tablet, 1 tablet, Oral, Daily, Paola Dreama SAILOR, MD, 1 tablet at 06/12/24 9074   ondansetron  (ZOFRAN -ODT) disintegrating tablet 4 mg, 4 mg, Oral, Q6H PRN **OR** ondansetron  (ZOFRAN ) injection 4 mg, 4 mg, Intravenous, Q6H PRN, Paola Dreama SAILOR, MD, 4 mg at 06/09/24 0423   pantoprazole  (PROTONIX ) EC tablet 40 mg, 40 mg, Oral, BID, Tammy Sor, PA-C, 40 mg at 06/12/24 0925   polyethylene glycol (MIRALAX  / GLYCOLAX ) packet 17 g, 17 g, Oral, Daily, Tammy Sor, PA-C, 17 g at 06/12/24 9074   sodium chloride  flush (NS) 0.9 % injection 3 mL, 3 mL, Intravenous, Q12H, Johnanna Credit Caylin, PA-C, 3 mL at 06/12/24 9073   sodium chloride  flush (NS) 0.9 % injection 3 mL, 3 mL, Intravenous, PRN, Johnanna Credit Caylin, PA-C   spironolactone  (ALDACTONE ) tablet 25 mg, 25 mg, Oral, Daily, Lovick, Dreama SAILOR, MD, 25 mg at 06/12/24 9074   triamcinolone  cream (KENALOG ) 0.1 % cream 1 Application, 1 Application, Topical, BID PRN, Paola Dreama SAILOR, MD, 1 Application at 06/09/24 2110   zolpidem  (AMBIEN ) tablet 5 mg, 5 mg, Oral, QHS PRN, Paola Dreama SAILOR, MD     Patients Current  Diet:  Diet Order                  Diet regular Fluid consistency: Thin  Diet effective now                         Precautions / Restrictions Precautions Precautions: Back Precaution Booklet Issued: Yes (comment) Precaution/Restrictions Comments: brace donned seated; no OOB without brace; may remove to shower Spinal Brace: Thoracolumbosacral orthotic, Applied in sitting position Restrictions Weight Bearing Restrictions Per Provider Order: Yes LLE Weight Bearing Per Provider Order: Touchdown weight bearing    Has the patient had 2 or more falls or a fall with injury in the past year?Yes   Prior Activity Level Limited Community (1-2x/wk): Pt. went out for appts   Prior Functional Level Prior Function Prior Level of Function : Independent/Modified Independent, Needs assist Mobility Comments: ambulates with  SPC ADLs Comments: does not drive, recent assist with medication management due to difficulty reading labels   Self Care: Did the patient need help bathing, dressing, using the toilet or eating?  Independent   Indoor Mobility: Did the patient need assistance with walking from room to room (with or without device)? Independent   Stairs: Did the patient need assistance with internal or external stairs (with or without device)? Needed some help   Functional Cognition: Did the patient need help planning regular tasks such as shopping or remembering to take medications? Independent   Patient Information Are you of Hispanic, Latino/a,or Spanish origin?: A. No, not of Hispanic, Latino/a, or Spanish origin What is your race?: A. White Do you need or want an interpreter to communicate with a doctor or health care staff?: 2. No   Patient's Response To:  Health Literacy and Transportation Is the patient able to respond to health literacy and transportation needs?: Yes Health Literacy - How often do you need to have someone help you when you read instructions, pamphlets, or other written material from your doctor or pharmacy?: Never In the past 12 months, has lack of transportation kept you from medical appointments or from getting medications?: No In the past 12 months, has lack of transportation kept you from meetings, work, or from getting things needed for daily living?: No   Home Assistive Devices / Equipment Home Equipment: Agricultural consultant (2 wheels), [employer] - single point   Prior Device Use: Indicate devices/aids used by the patient prior to current illness, exacerbation or injury? SPC   Current Functional Level Cognition   Orientation Level: Oriented X4    Extremity Assessment (includes Sensation/Coordination)   Upper Extremity Assessment: Generalized weakness  Lower Extremity Assessment: Defer to PT evaluation LLE Deficits / Details: +edema, bruising; AAROM limited in supine; seated  able to bend hip and knee to 90 with encouragement LLE Sensation: history of peripheral neuropathy     ADLs   Overall ADL's : Needs assistance/impaired Eating/Feeding: Set up, Sitting Grooming: Set up, Sitting Upper Body Bathing: Set up, Sitting Lower Body Bathing: Maximal assistance, Sit to/from stand Upper Body Dressing : Moderate assistance Upper Body Dressing Details (indicate cue type and reason): donning TLSO Lower Body Dressing: Maximal assistance, Sit to/from stand Toilet Transfer: +2 for physical assistance, Stand-pivot, BSC/3in1, Minimal assistance Toilet Transfer Details (indicate cue type and reason): simulated bed > chair Toileting- Clothing Manipulation and Hygiene: Moderate assistance, Sit to/from stand Functional mobility during ADLs: +2 for physical assistance, Minimal assistance     Mobility   Overal bed mobility: Needs Assistance  Bed Mobility: Rolling, Sidelying to Sit Rolling: Min assist Sidelying to sit: Min assist, Used rails, HOB elevated General bed mobility comments: assist to bend LLE prior to rolling to her rt; Once sidelying, min A to initiate rise progressing to CGA; intermittent assist to scoot L hip toward EOB     Transfers   Overall transfer level: Needs assistance Equipment used: Rolling walker (2 wheels) Transfers: Sit to/from Stand Sit to Stand: Min assist Bed to/from chair/wheelchair/BSC transfer type:: Step pivot Stand pivot transfers: Min assist, +2 safety/equipment General transfer comment: vc for correct hand placement x 2 reps; steadying assist as transitioning hands from furniture up to RW     Ambulation / Gait / Stairs / Wheelchair Mobility   Ambulation/Gait Ambulation/Gait assistance: Min assist, +2 safety/equipment Gait Distance (Feet): 10 Feet Assistive device: Rolling walker (2 wheels) Gait Pattern/deviations: Step-to pattern General Gait Details: able to use UEs to support her weight and advance RLE while maintaining TDWB LLE;  limited by pain in chest/ribs when weight-bearing through arms     Posture / Balance Dynamic Sitting Balance Sitting balance - Comments: statically Balance Overall balance assessment: Needs assistance Sitting-balance support: No upper extremity supported Sitting balance-Leahy Scale: Good Sitting balance - Comments: statically Standing balance support: Bilateral upper extremity supported Standing balance-Leahy Scale: Poor Standing balance comment: reliant on device and external assist intermittently     Special needs/care consideration Skin surgical incision and Special service needs spinal precautions, TDWB on LLE        Previous Home Environment (from acute therapy documentation) Living Arrangements: Spouse/significant other Available Help at Discharge: Family, Available 24 hours/day Type of Home: House Home Layout: One level Home Access: Stairs to enter Entrance Stairs-Rails: None Entrance Stairs-Number of Steps: 1 Bathroom Shower/Tub: Engineer, manufacturing systems: Standard Bathroom Accessibility: Yes How Accessible: Accessible via walker Home Care Services: No   Discharge Living Setting Plans for Discharge Living Setting: Patient's home Type of Home at Discharge: House Discharge Home Layout: One level Discharge Home Access: Stairs to enter Entrance Stairs-Rails: None Entrance Stairs-Number of Steps: 1 Discharge Bathroom Shower/Tub: Tub/shower unit Discharge Bathroom Toilet: Standard Discharge Bathroom Accessibility: Yes How Accessible: Accessible via walker Does the patient have any problems obtaining your medications?: No   Social/Family/Support Systems Patient Roles: Spouse Contact Information: [phone] Anticipated Caregiver: Christopher Caregiver Availability: 24/7 Discharge Plan Discussed with Primary Caregiver: Yes Is Caregiver In Agreement with Plan?: Yes Does Caregiver/Family have Issues with Lodging/Transportation while Pt is in Rehab?: No      Goals Patient/Family Goal for Rehab: PT/OT Supervision Expected length of stay: 7-10 days Pt/Family Agrees to Admission and willing to participate: Yes Program Orientation Provided & Reviewed with Pt/Caregiver Including Roles  & Responsibilities: Yes     Decrease burden of Care through IP rehab admission: Not anticipated     Possible need for SNF placement upon discharge:Not anticipated     Patient Condition: This patient's medical and functional status has changed since the consult dated: 06/11/24 in which the Rehabilitation Physician determined and documented that the patient's condition is appropriate for intensive rehabilitative care in an inpatient rehabilitation facility. See History of Present Illness (above) for medical update. Functional changes are: Pt. CGA with mobility. Patient's medical and functional status update has been discussed with the Rehabilitation physician and patient remains appropriate for inpatient rehabilitation. Will admit to inpatient rehab today.   Preadmission Screen Completed By:  Leita KATHEE Kleine, CCC-SLP, 06/12/2024 3:15 PM ______________________________________________________________________   Discussed status with Dr. Carilyn on 06/18/24 at  955 and received approval for admission today.

## 2024-06-18 NOTE — H&P (Signed)
 Physical Medicine and Rehabilitation Admission H&P        Chief Complaint  Patient presents with   Fall with trauma and functional deficits      HPI:  Kimberly [name] is a 68 year old female with history of non-obst CAD, COPD, cirrhosis of liver secondary to NASH, multiple back surgeries with lumbar radiculopathy, Bilateral quad weakness s/p R-TKR and pending replacement on the left, obesity s/p duodenal switch with resultant N/V/B/B incontinence, thrombocytopenia, macula degeneration (worse vision at nights) who was admitted on 06/08/24 after fall where she struck her head and onset of left hip and back pain with worsening of neuropathy LLE as well as numbness/tingling BUE. She had recently been started on gabapentin  and ambien  per reports. She was found to have displaced proximal left femur fracture, presacral hematoma with possible unusual Chance type Fx involving T 12 vertebral body and spinous process, mild to moderate central spinal canal stenosis T10-T11,   chronic DDD C6/7 with mild to moderate central spine stenosis and moderate degenerative changes right shoulder. She was taken to OR for ORIF with IM nailing left femur by Dr. Celena and open reduction with pedicle screw/rod construct T11-T12- L1 by Dr. Debby on the same day. Post op to wear TLSO when out of bed and is TTWB on LLE with recommendation of 2 week post op follow up on hip for imaging/sutures.     Hospital course significant for constipation which has resolved with addition of laxatives. BS being monitored due to history of hypoglycemia since DSS and follow up CBC showed pancytopenia. BMET not checked since admission. Vitamin D  deficiency noted @ 19.97. PT/OT was consulted and has been working with patient who is showing improvement in activity tolerance. She requires CGA to min assist with ADLS and mobility. She was independent PTA and CIR recommended due to functional decline.        Review of Systems  Constitutional:   Negative for chills and fever.  HENT:  Negative for hearing loss.   Eyes:  Positive for blurred vision (peripheral vision blurry  and at times has black spots when she first gets up).  Cardiovascular:  Negative for chest pain and palpitations.  Gastrointestinal:  Positive for constipation. Negative for nausea.  Musculoskeletal:  Positive for back pain and myalgias (muscle spasms.).  Skin:  Negative for rash.  Neurological:  Positive for sensory change and weakness. Negative for dizziness and headaches.  Psychiatric/Behavioral:  The patient is nervous/anxious.          Past Medical History:  Diagnosis Date   Anemia      PMH: as a child   Arthritis     Asthma     CAD (coronary artery disease)      nonobstructive by cath, 6/08 (false positive Cardiolite ) normal stress echo, 7/11   CHF (congestive heart failure) (HCC)     Chronic back pain     Chronic bronchitis (HCC)     Cirrhosis (HCC)     Complication of anesthesia      had an asthma attack when woke up from procedure   Complication of anesthesia      asthma attack with one surgery   COPD (chronic obstructive pulmonary disease) (HCC)     Degenerative joint disease     Depression     DJD (degenerative joint disease)     Fatty liver     Fibromyalgia     GERD (gastroesophageal reflux disease)     Glaucoma  Headache(784.0)     Heart failure, diastolic, chronic (HCC)     Heart murmur      PMH:As a child only   Hernia of abdominal cavity      upper and lower hernia   History of hiatal hernia     HTN (hypertension)     Hypertension     Hypothyroidism     IBS (irritable bowel syndrome)     Morbid obesity (HCC)     Neuropathy      associated with diabetes   Obstructive sleep apnea     Pancreatitis     Pinched nerve in neck     Pneumonia      as a child   PONV (postoperative nausea and vomiting)     Pulmonary hypertension (HCC)     Rheumatic fever      PMH: as a child   Seizures (HCC)      PMH: only as a child    Seizures (HCC)      in childhood   Shortness of breath     Sleep apnea     Spinal stenosis     Stroke (HCC)      12/09/2013               Past Surgical History:  Procedure Laterality Date   ABDOMINAL HYSTERECTOMY       ABDOMINAL HYSTERECTOMY        partial hysterectomy   APPENDECTOMY       BACK SURGERY       BACK SURGERY        disc surgery with complications and damage to right side   BARIATRIC SURGERY        DS in June 2020   BILATERAL KNEE ARTHROSCOPY   2000, 2009   BIOPSY   06/16/2021    Procedure: BIOPSY;  Surgeon: Eartha Angelia Sieving, MD;  Location: [facility];  Service: Gastroenterology;;   BREAST CYST EXCISION Left     CARDIAC CATHETERIZATION        2009   CATARACT EXTRACTION W/ INTRAOCULAR LENS  IMPLANT, BILATERAL       CHOLECYSTECTOMY   11/22/1992   CHOLECYSTECTOMY       COLONOSCOPY N/A 08/01/2013    Procedure: COLONOSCOPY;  Surgeon: Claudis RAYMOND Rivet, MD;  Location: [facility];  Service: Endoscopy;  Laterality: N/A;  930   COLONOSCOPY N/A 08/03/2017    Procedure: COLONOSCOPY;  Surgeon: Rivet Claudis RAYMOND, MD;  Location: [facility];  Service: Endoscopy;  Laterality: N/A;  12:00   COLONOSCOPY WITH PROPOFOL  N/A 06/28/2023    Procedure: COLONOSCOPY WITH PROPOFOL ;  Surgeon: Eartha Angelia Sieving, MD;  Location: [facility];  Service: Gastroenterology;  Laterality: N/A;  12:30pm;asa 3   CYST EXCISION        Left breast   ESOPHAGOGASTRODUODENOSCOPY (EGD) WITH PROPOFOL  N/A 06/16/2021    Procedure: ESOPHAGOGASTRODUODENOSCOPY (EGD) WITH PROPOFOL ;  Surgeon: Eartha Angelia Sieving, MD;  Location: [facility];  Service: Gastroenterology;  Laterality: N/A;  12:50   ESOPHAGOGASTRODUODENOSCOPY (EGD) WITH PROPOFOL  N/A 06/28/2023    Procedure: ESOPHAGOGASTRODUODENOSCOPY (EGD) WITH PROPOFOL ;  Surgeon: Eartha Angelia Sieving, MD;  Location: [facility];  Service: Gastroenterology;  Laterality: N/A;  12:30pm;asa 3   EXCISIONAL HEMORRHOIDECTOMY       EYE  SURGERY   11/22/1965   EYE SURGERY        bilateral cataract removal with lens implants   EYE SURGERY        growth removed  from right eye lid   EYE SURGERY        bilateral eye surgery age 54 tto straighten eyes'   FEMUR IM NAIL Left 06/08/2024    Procedure: INSERTION, INTRAMEDULLARY ROD, FEMUR;  Surgeon: Celena Sharper, MD;  Location: [facility];  Service: Orthopedics;  Laterality: Left;   heel (other)   11/23/2003   HEEL SPUR SURGERY        Right heel - growth removal and rebuilding of heel   HEMORRHOID SURGERY       HERNIA REPAIR       KNEE ARTHROSCOPY Bilateral     LUMBAR LAMINECTOMY/DECOMPRESSION MICRODISCECTOMY Right 08/23/2014    Procedure: LUMBAR LAMINECTOMY/DECOMPRESSION MICRODISCECTOMY 1 LEVEL;  Surgeon: Victory DELENA Gunnels, MD;  Location: [facility];  Service: Neurosurgery;  Laterality: Right;  LUMBAR LAMINECTOMY/DECOMPRESSION MICRODISCECTOMY 1 LEVEL LUMBAR 2-3   LUMBAR PERCUTANEOUS PEDICLE SCREW 2 LEVEL N/A 06/08/2024    Procedure: THORACIC ELEVEN-LUMBAR ONE PERCUTANEOUS PEDICLE SCREW;  Surgeon: Debby Dorn MATSU, MD;  Location: [facility];  Service: Neurosurgery;  Laterality: N/A;  T11-L1 Percutaneous Fusion   ORIF FEMUR FRACTURE Left 06/08/2024    Procedure: OPEN REDUCTION INTERNAL FIXATION FEMORAL SHAFT FRACTURE;  Surgeon: Celena Sharper, MD;  Location: [facility];  Service: Orthopedics;  Laterality: Left;   ovaries removed   11/22/2001   PARTIAL HYSTERECTOMY   11/23/1979   SHOULDER ARTHROSCOPY WITH DISTAL CLAVICLE RESECTION Left 08/06/2022    Procedure: SHOULDER ARTHROSCOPY WITH DISTAL CLAVICLE RESECTION;  Surgeon: Sharl Selinda Dover, MD;  Location[facility] OR;  Service: Orthopedics;  Laterality: Left;   SHOULDER ARTHROSCOPY WITH ROTATOR CUFF REPAIR Left 08/06/2022    Procedure: SHOULDER ARTHROSCOPY WITH ROTATOR CUFF REPAIR;  Surgeon: Sharl Selinda Dover, MD;  Location: [facility] OR;  Service: Orthopedics;  Laterality: Left;   SHOULDER ARTHROSCOPY WITH SUBACROMIAL DECOMPRESSION Left 08/06/2022     Procedure: SHOULDER ARTHROSCOPY WITH SUBACROMIAL DECOMPRESSIONWITH EXTENSIVE DEBRIDEMENT;  Surgeon: Sharl Selinda Dover, MD;  Location: [facility] OR;  Service: Orthopedics;  Laterality: Left;   SPHINCTEROTOMY       spinahatomy   11/22/1990   TOTAL KNEE ARTHROPLASTY Right 11/05/2020    Procedure: RIGHT TOTAL KNEE ARTHROPLASTY;  Surgeon: Barbarann Oneil BROCKS, MD;  Location: [facility];  Service: Orthopedics;  Laterality: Right;   TUBAL LIGATION   11/22/1973   TUBAL LIGATION                   Family History  Problem Relation Age of Onset   Colon cancer Brother     Cancer Other     Heart failure Other     Breast cancer Neg Hx            Social History:  reports that she has never smoked. She has never been exposed to tobacco smoke. She has never used smokeless tobacco. She reports that she does not drink alcohol  and does not use drugs.     Allergies       Allergies  Allergen Reactions   Aleve [Naproxen] Anaphylaxis, Swelling and Other (See Comments)      Throat closes   Cinnamon Anaphylaxis      Reaction caused by a medication interaction.   Other Shortness Of Breath and Other (See Comments)      Local anesthesia - triggers asthma attacks   08/06/22 tolerated amide local anesthetic in peripheral nerve block.   Roxicodone  [Oxycodone ] Other (See Comments)      Over sedation   Atrovent Nasal Spray [Ipratropium] Other (See Comments)  Epistaxis    Feldene [Piroxicam] Nausea Only   Flonase [Fluticasone ] Other (See Comments)      Epistaxis    Lyrica [Pregabalin] Other (See Comments)      Altered mental status for prolonged period   Nsaids Other (See Comments)      Hx of gastic bypass - told to avoid.   Phenergan [Promethazine Hcl] Other (See Comments)      Triggers asthma attacks   Sulfa Antibiotics Other (See Comments)      Yeast infection   Zohydro Er [Hydrocodone  Bitartrate Er] Other (See Comments)      Over sedation with IR and ER formulations   Latex Rash and Other (See Comments)       Skin blisters   Tape Rash and Other (See Comments)      Do not use adhesive tape; okay to use paper tape. 08/06/22 tolerated adhesive tape for peripheral IV              Medications Prior to Admission  Medication Sig Dispense Refill   acetaminophen  (TYLENOL ) 500 MG tablet Take 500 mg by mouth 2 (two) times daily as needed for moderate pain (pain score 4-6), fever or headache.       albuterol  (VENTOLIN  HFA) 108 (90 Base) MCG/ACT inhaler Inhale 2 puffs into the lungs 4 (four) times daily as needed for shortness of breath.       Cholecalciferol  (VITAMIN D -3 PO) Take 1 capsule by mouth daily.       Copper  Gluconate 2 MG TABS Take 2 mg by mouth daily with supper.       cyclobenzaprine  (FLEXERIL ) 10 MG tablet Take 10 mg by mouth every 8 (eight) hours as needed for muscle spasms.       denosumab  (PROLIA ) 60 MG/ML SOSY injection Inject 60 mg into the skin every 6 (six) months.       famotidine  (PEPCID ) 40 MG tablet Take 40 mg by mouth daily.       furosemide  (LASIX ) 20 MG tablet Take 20 mg by mouth daily.       gabapentin  (NEURONTIN ) 100 MG capsule Take 100 mg by mouth 2 (two) times daily with a meal.       levothyroxine  (SYNTHROID ) 125 MCG tablet Take 125 mcg by mouth daily before breakfast.       Multiple Vitamins-Minerals (MULTIVITAMIN WOMEN 50+) TABS Take 1 tablet by mouth daily.       omeprazole  (PRILOSEC) 40 MG capsule Take 1 capsule (40 mg total) by mouth daily. 90 capsule 3   ondansetron  (ZOFRAN ) 4 MG tablet TAKE 1 TABLET BY MOUTH EVERY 8 HOURS AS NEEDED FOR NAUSEA OR VOMITING 30 tablet 1   spironolactone  (ALDACTONE ) 25 MG tablet Take 25 mg by mouth daily.       TRELEGY ELLIPTA 100-62.5-25 MCG/ACT AEPB Inhale 1 puff into the lungs every other day.       triamcinolone  cream (KENALOG ) 0.1 % Apply 1 Application topically 2 (two) times daily as needed (skin irritation).       zolpidem  (AMBIEN ) 5 MG tablet Take 5 mg by mouth at bedtime.                  Home: Home  Living Family/patient expects to be discharged to:: Private residence Living Arrangements: Spouse/significant other Available Help at Discharge: Family, Available 24 hours/day Type of Home: House Home Access: Stairs to enter [employer] of Steps: 1 Entrance Stairs-Rails: None Home Layout: One level Bathroom Shower/Tub: Nurse, adult  Toilet: Standard Bathroom Accessibility: Yes Home Equipment: Agricultural consultant (2 wheels), [employer] - single point   Functional History: Prior Function Prior Level of Function : Independent/Modified Independent, Needs assist Mobility Comments: ambulates with SPC ADLs Comments: does not drive, recent assist with medication management due to difficulty reading labels   Functional Status:  Mobility: Bed Mobility Overal bed mobility: Needs Assistance Bed Mobility: Rolling, Sidelying to Sit Rolling: Supervision Sidelying to sit: Contact guard assist, Used rails Sit to sidelying: Min assist, Used rails General bed mobility comments: return to bed; min assist to raise legs and max cues for sequencing to maintain back precautions Transfers Overall transfer level: Needs assistance Equipment used: Rolling walker (2 wheels) Transfers: Sit to/from Stand Sit to Stand: Contact guard assist Bed to/from chair/wheelchair/BSC transfer type:: Step pivot Stand pivot transfers: Min assist, +2 safety/equipment General transfer comment: verbal cues for hand placement Ambulation/Gait Ambulation/Gait assistance: Contact guard assist Gait Distance (Feet): 15 Feet (15' x 2 trials) Assistive device: Rolling walker (2 wheels) Gait Pattern/deviations: Step-to pattern General Gait Details: pt with slowed step-to gait, maintains TDWB through toes on L foot well Gait velocity: reduced Gait velocity interpretation: <1.31 ft/sec, indicative of household ambulator   ADL: ADL Overall ADL's : Needs assistance/impaired Eating/Feeding: Set up, Sitting Grooming:  Set up, Sitting Upper Body Bathing: Set up, Sitting Lower Body Bathing: Maximal assistance, Sit to/from stand Upper Body Dressing : Minimal assistance, Sitting Upper Body Dressing Details (indicate cue type and reason): donning TLSO Lower Body Dressing: Maximal assistance, Sit to/from stand Toilet Transfer: Contact guard assist, Rolling walker (2 wheels) Toilet Transfer Details (indicate cue type and reason): simulated bed > chair Toileting- Clothing Manipulation and Hygiene: Contact guard assist, Minimal assistance, Sit to/from stand Functional mobility during ADLs: Contact guard assist, Rolling walker (2 wheels)   Cognition: Cognition Orientation Level: Oriented X4 Cognition Arousal: Alert Behavior During Therapy: WFL for tasks assessed/performed     Blood pressure (!) 96/54, pulse 70, temperature 98.3 F (36.8 C), temperature source Oral, resp. rate 18, height 5' (1.524 m), weight 63.3 kg, SpO2 96%. Physical Exam Vitals and nursing note reviewed.  Constitutional:      Appearance: She is underweight.     Comments: Anxious appearing  Musculoskeletal:     Comments: LLE limited by pain--foam dressing on small incisions left lateral thigh as well as linear area of ecchymosis.   Skin:    General: Skin is warm and dry.  Neurological:     Mental Status: She is alert and oriented to person, place, and time.     General: No acute distress Mood and affect are appropriate Heart: Regular rate and rhythm no rubs murmurs or extra sounds Lungs: Clear to auscultation, breathing unlabored, no rales or wheezes Abdomen: Positive bowel sounds, soft nontender to palpation, nondistended Extremities: No clubbing, cyanosis, or edema Skin: No evidence of breakdown, no evidence of rash Neurologic: Cranial nerves II through XII intact, motor strength is 5/5 in bilateral deltoid, bicep, tricep, grip, 4-/5 RIght and 3-/5 Left hip flexor, knee extensors, ankle dorsiflexor and plantar flexor Sensory  exam normal sensation to light touch  in bilateral upper and lower extremities Cerebellar exam normal finger to nose to finger  in bilateral upper extremities Musculoskeletal: Full range of motion in all 4 extremities. No joint swelling    Lab Results Last 48 Hours        Results for orders placed or performed during the hospital encounter of 06/08/24 (from the past 48 hours)  Glucose, capillary  Status: None    Collection Time: 06/16/24 12:06 PM  Result Value Ref Range    Glucose-Capillary 82 70 - 99 mg/dL      Comment: Glucose reference range applies only to samples taken after fasting for at least 8 hours.  Glucose, capillary     Status: Abnormal    Collection Time: 06/16/24  3:25 PM  Result Value Ref Range    Glucose-Capillary 109 (H) 70 - 99 mg/dL      Comment: Glucose reference range applies only to samples taken after fasting for at least 8 hours.  Glucose, capillary     Status: Abnormal    Collection Time: 06/16/24  7:36 PM  Result Value Ref Range    Glucose-Capillary 117 (H) 70 - 99 mg/dL      Comment: Glucose reference range applies only to samples taken after fasting for at least 8 hours.  Glucose, capillary     Status: Abnormal    Collection Time: 06/16/24 11:22 PM  Result Value Ref Range    Glucose-Capillary 104 (H) 70 - 99 mg/dL      Comment: Glucose reference range applies only to samples taken after fasting for at least 8 hours.  Glucose, capillary     Status: None    Collection Time: 06/17/24  3:38 AM  Result Value Ref Range    Glucose-Capillary 98 70 - 99 mg/dL      Comment: Glucose reference range applies only to samples taken after fasting for at least 8 hours.  Glucose, capillary     Status: Abnormal    Collection Time: 06/17/24  8:03 AM  Result Value Ref Range    Glucose-Capillary 129 (H) 70 - 99 mg/dL      Comment: Glucose reference range applies only to samples taken after fasting for at least 8 hours.  Glucose, capillary     Status: None    Collection  Time: 06/17/24 11:49 AM  Result Value Ref Range    Glucose-Capillary 75 70 - 99 mg/dL      Comment: Glucose reference range applies only to samples taken after fasting for at least 8 hours.  Glucose, capillary     Status: None    Collection Time: 06/17/24  4:03 PM  Result Value Ref Range    Glucose-Capillary 86 70 - 99 mg/dL      Comment: Glucose reference range applies only to samples taken after fasting for at least 8 hours.  Glucose, capillary     Status: Abnormal    Collection Time: 06/17/24  7:24 PM  Result Value Ref Range    Glucose-Capillary 100 (H) 70 - 99 mg/dL      Comment: Glucose reference range applies only to samples taken after fasting for at least 8 hours.  Glucose, capillary     Status: Abnormal    Collection Time: 06/17/24 11:15 PM  Result Value Ref Range    Glucose-Capillary 102 (H) 70 - 99 mg/dL      Comment: Glucose reference range applies only to samples taken after fasting for at least 8 hours.  Glucose, capillary     Status: None    Collection Time: 06/18/24  3:16 AM  Result Value Ref Range    Glucose-Capillary 86 70 - 99 mg/dL      Comment: Glucose reference range applies only to samples taken after fasting for at least 8 hours.  Glucose, capillary     Status: None    Collection Time: 06/18/24  8:01 AM  Result Value  Ref Range    Glucose-Capillary 82 70 - 99 mg/dL      Comment: Glucose reference range applies only to samples taken after fasting for at least 8 hours.      Imaging Results (Last 48 hours)  No results found.         Blood pressure (!) 96/54, pulse 70, temperature 98.3 F (36.8 C), temperature source Oral, resp. rate 18, height 5' (1.524 m), weight 63.3 kg, SpO2 96%.   Medical Problem List and Plan: 1. Functional deficits secondary to Polytrauma             -patient may not shower             -ELOS/Goals: 7-10d 2.  Antithrombotics: -DVT/anticoagulation:  Pharmaceutical: Lovenox              -antiplatelet therapy: N/A 3. Pain  Management:  Hydrocodone  10 mg prn severe pain and 5 mg prn moderate pain. Premedicate prior to therapy--has a lot of anxiety regarding activity tolerance/pain.              --continues to have muscle spasms worse when in chair. Continue robaxin  750 mg tid. May need baclofen  additionally.  4. Mood/Behavior/Sleep: LCSW to follow for evaluation and support.              -antipsychotic agents: N/A. Team to provide ego support.             --melatonin prn for insomnia. Buspar  prn added for anxiety.  5. Neuropsych/cognition: This patient is capable of making decisions on her own behalf. 6. Skin/Wound Care: Routine pressure relief measures.  7. Fluids/Electrolytes/Nutrition: Monitor I/O. Check CMET in am 8. T12 chance Fx s/p fixation: Wear TLSO when out of bed 9. Displaced femur fx s/p ORIF: TTWB with follow up X rays in 2 weeks 10. H/o Multifactorial Neuropathy: Continue gabapentin  BID.  Has chronic L2-3 stenosis which has worsened over time.   11.  H/o Obesity s/p duodenal switch surgery: NO longer on meds for HTN, DM  and does not have sleep apnea. Pt reports 150lb weight loss  --Reports has not been able to afford vitamins--discussed generic --options. --Resume copper . Change Vit C to B complex w/C.  12. H/o dumping syndrome:  Has had bowel and bladder incontinence since DSS.  13. H/o spinal stenosis: Multiple surgery with RLE  14. Thrombocytopenia: Pancytopenia in the past felt to be due to nutritional/copper  deficiency.              --Platelets recovering from 85 to 118 15. Acute blood loss anemia: Recheck hgb in am 16. Neutropenia: WBC- 6.5-->3.9.  Recheck in am.  17. Vitamin D  deficiency- 19.97: Change cholecalciferol  to ergocalciferol  added.   18. COPD: Hx of cough/second hand smoke exposure. Continue Breztri .  19. Hypotension: Will monitor weights daily. TEDs for edema control.  --D/c lasix . TEDs for edema control LLE. May need binder if orthostatic.  20. Hypothyroid: Continue supplement.        Sharlet GORMAN Schmitz, PA-C 06/18/2024  I have personally performed a face to face diagnostic evaluation of this patient.  Additionally, I have reviewed and concur with the physician assistant's documentation above. Prentice CHARLENA Compton M.D. [facility] Fellow [employer] Med and Rehab Diplomate Am Board of Electrodiagnostic Med Fellow Am Board of Interventional Pain

## 2024-06-18 NOTE — Progress Notes (Signed)
 Occupational Therapy Treatment Patient Details Name: Kimberly [name] MRN: [id number] DOB: 1956/09/21 Today's Date: 06/18/2024   History of present illness 68 y.o. female who presented 06/08/24 via EMS after a fall. Cervical MRI negative; T11/12 multi column Chance type fracture (7/18 ORIF T11-L1); L femur fx (7/18 ORIF) PMH significant for CAD, CHF, COPD, Cirrhosis, Fibromyalgia, HTN, HLD, Hypothyroidism, OSA, Pulm HTN, Seizures, CVA.   OT comments  Pt progressing toward goals this session, needing CGA-min A for ADLs, CGA for bed mobility via log roll technique and CGA for transfers with RW. Pt presenting with impairments listed below, will follow acutely. Patient will benefit from intensive inpatient follow-up therapy, >3 hours/day to maximize safety/ind with ADL/functional mobility.       If plan is discharge home, recommend the following:  A lot of help with bathing/dressing/bathroom;A lot of help with walking and/or transfers;Assistance with cooking/housework;Assist for transportation;Help with stairs or ramp for entrance   Equipment Recommendations  Other (comment) (defer)    Recommendations for Other Services Rehab consult    Precautions / Restrictions Precautions Precautions: Back Precaution Booklet Issued: Yes (comment) Recall of Precautions/Restrictions: Intact Precaution/Restrictions Comments: pt recalls all back precautions Required Braces or Orthoses: Spinal Brace Spinal Brace: Thoracolumbosacral orthotic;Applied in sitting position Restrictions Weight Bearing Restrictions Per Provider Order: Yes LLE Weight Bearing Per Provider Order: Touchdown weight bearing       Mobility Bed Mobility Overal bed mobility: Needs Assistance Bed Mobility: Rolling, Sidelying to Sit, Sit to Supine Rolling: Contact guard assist Sidelying to sit: Contact guard assist, Used rails, HOB elevated            Transfers Overall transfer level: Needs assistance Equipment used: Rolling  walker (2 wheels) Transfers: Sit to/from Stand Sit to Stand: Contact guard assist                 Balance Overall balance assessment: Needs assistance Sitting-balance support: No upper extremity supported, Feet supported Sitting balance-Leahy Scale: Good     Standing balance support: Bilateral upper extremity supported, Reliant on assistive device for balance Standing balance-Leahy Scale: Poor                             ADL either performed or assessed with clinical judgement   ADL Overall ADL's : Needs assistance/impaired     Grooming: Wash/dry hands;Sitting;Standing           Upper Body Dressing : Minimal assistance;Sitting Upper Body Dressing Details (indicate cue type and reason): donning TLSO     Toilet Transfer: Contact guard assist;Ambulation;Rolling walker (2 wheels);Regular Toilet   Toileting- Clothing Manipulation and Hygiene: Contact guard assist;Minimal assistance;Sit to/from stand       Functional mobility during ADLs: Contact guard assist;Rolling walker (2 wheels)      Extremity/Trunk Assessment Upper Extremity Assessment Upper Extremity Assessment: Generalized weakness   Lower Extremity Assessment Lower Extremity Assessment: Defer to PT evaluation        Vision   Vision Assessment?: No apparent visual deficits   Perception Perception Perception: Not tested   Praxis Praxis Praxis: Not tested   Communication Communication Communication: No apparent difficulties   Cognition Arousal: Alert Behavior During Therapy: WFL for tasks assessed/performed Cognition: No apparent impairments                               Following commands: Intact        Cueing  Cueing Techniques: Verbal cues  Exercises      Shoulder Instructions       General Comments VSS on RA    Pertinent Vitals/ Pain       Pain Assessment Pain Assessment: Faces Pain Score: 7  Faces Pain Scale: Hurts whole lot Pain Location: back  and LLE Pain Descriptors / Indicators: Aching Pain Intervention(s): Limited activity within patient's tolerance, Monitored during session, Repositioned  Home Living                                          Prior Functioning/Environment              Frequency  Min 2X/week        Progress Toward Goals  OT Goals(current goals can now be found in the care plan section)  Progress towards OT goals: Progressing toward goals  Acute Rehab OT Goals Patient Stated Goal: to get better OT Goal Formulation: With patient Time For Goal Achievement: 06/23/24 Potential to Achieve Goals: Good ADL Goals Pt Will Perform Grooming: with contact guard assist;standing Pt Will Perform Lower Body Dressing: with contact guard assist;sit to/from stand Pt Will Transfer to Toilet: with contact guard assist;stand pivot transfer Additional ADL Goal #1: Pt will perform bed mobility with supervision within precautions as a precursor to ADL Additional ADL Goal #2: Pt will don spinal brace with set-up A  Plan      Co-evaluation                 AM-PAC OT 6 Clicks Daily Activity     Outcome Measure   Help from another person eating meals?: A Little Help from another person taking care of personal grooming?: A Little Help from another person toileting, which includes using toliet, bedpan, or urinal?: A Little Help from another person bathing (including washing, rinsing, drying)?: A Lot Help from another person to put on and taking off regular upper body clothing?: A Little Help from another person to put on and taking off regular lower body clothing?: A Lot 6 Click Score: 16    End of Session Equipment Utilized During Treatment: Gait belt;Rolling walker (2 wheels)  OT Visit Diagnosis: Unsteadiness on feet (R26.81);Muscle weakness (generalized) (M62.81);Other symptoms and signs involving cognitive function;History of falling (Z91.81);Pain Pain - Right/Left: Left Pain - part  of body: Leg   Activity Tolerance Patient limited by pain   Patient Left in chair;with call bell/phone within reach;with chair alarm set   Nurse Communication Mobility status        Time: 8967-8941 OT Time Calculation (min): 26 min  Charges: OT General Charges $OT Visit: 1 Visit OT Treatments $Self Care/Home Management : 23-37 mins  Marlise Fahr K, OTD, OTR/L [employer] Preferred Acute Rehab [phone]   Shukri Nistler K Koonce 06/18/2024, 11:08 AM

## 2024-06-18 NOTE — Progress Notes (Signed)
 Report given to 4W RN. Patient's belongings accounted for and returned to patient. Patient transported off unit in bed. Patient's husband, sister, and brother-in-law accompanied during transport. Patient transferred to 4W-17.

## 2024-06-19 DIAGNOSIS — S7292XA Unspecified fracture of left femur, initial encounter for closed fracture: Secondary | ICD-10-CM | POA: Diagnosis present

## 2024-06-19 DIAGNOSIS — S72402A Unspecified fracture of lower end of left femur, initial encounter for closed fracture: Secondary | ICD-10-CM

## 2024-06-19 DIAGNOSIS — S22082A Unstable burst fracture of T11-T12 vertebra, initial encounter for closed fracture: Secondary | ICD-10-CM

## 2024-06-19 LAB — GLUCOSE, CAPILLARY
Glucose-Capillary: 80 mg/dL (ref 70–99)
Glucose-Capillary: 93 mg/dL (ref 70–99)
Glucose-Capillary: 86 mg/dL (ref 70–99)
Glucose-Capillary: 96 mg/dL (ref 70–99)

## 2024-06-19 LAB — COMPREHENSIVE METABOLIC PANEL WITH GFR
ALT: 16 U/L (ref 0–44)
AST: 26 U/L (ref 15–41)
Albumin: 2.5 g/dL — ABNORMAL LOW (ref 3.5–5.0)
Alkaline Phosphatase: 67 U/L (ref 38–126)
Anion gap: 7 (ref 5–15)
BUN: 16 mg/dL (ref 8–23)
CO2: 18 mmol/L — ABNORMAL LOW (ref 22–32)
Calcium: 7.2 mg/dL — ABNORMAL LOW (ref 8.9–10.3)
Chloride: 113 mmol/L — ABNORMAL HIGH (ref 98–111)
Creatinine, Ser: 0.76 mg/dL (ref 0.44–1.00)
GFR, Estimated: >60 mL/min (ref >60)
Glucose, Bld: 86 mg/dL (ref 70–99)
Potassium: 4.1 mmol/L (ref 3.5–5.1)
Sodium: 138 mmol/L (ref 135–145)
Total Bilirubin: 1.4 mg/dL — ABNORMAL HIGH (ref 0.0–1.2)
Total Protein: 5.2 g/dL — ABNORMAL LOW (ref 6.5–8.1)

## 2024-06-19 LAB — HEMOGLOBIN A1C
Hgb A1c MFr Bld: 3.8 % — ABNORMAL LOW (ref 4.8–5.6)
Mean Plasma Glucose: 62.36 mg/dL

## 2024-06-19 MED ORDER — BUSPIRONE HCL 10 MG PO TABS
5.0000 mg | ORAL_TABLET | Freq: Three times a day (TID) | ORAL | Status: DC
  Administered 2024-06-19 – 2024-06-29 (×30): 5 mg via ORAL
  Filled 2024-06-19 (×30): qty 1

## 2024-06-19 NOTE — Progress Notes (Signed)
 Physical Therapy Assessment and Plan  Patient Details  Name: Kimberly [name] MRN: [id number] Date of Birth: January 15, 1956  PT Diagnosis: Abnormal posture, Abnormality of gait, Difficulty walking, Low back pain, Muscle spasms, Muscle weakness, Pain in joint, and Pain in LB Rehab Potential: Good ELOS: 7-10 days   Today's Date: 06/19/2024 PT Individual Time: 1030-1130 PT Individual Time Calculation (min): 60 min   PT Individual Time: 1345-1445; 60 minutes  Hospital Problem: Principal Problem:   Thoracic spine fracture (HCC) Active Problems:   Femur fracture, left (HCC)   Past Medical History:  Past Medical History:  Diagnosis Date   Anemia    PMH: as a child   Arthritis    Asthma    CAD (coronary artery disease)    nonobstructive by cath, 6/08 (false positive Cardiolite ) normal stress echo, 7/11   CHF (congestive heart failure) (HCC)    Chronic back pain    Chronic bronchitis (HCC)    Cirrhosis (HCC)    Complication of anesthesia    had an asthma attack when woke up from procedure   Complication of anesthesia    asthma attack with one surgery   COPD (chronic obstructive pulmonary disease) (HCC)    Degenerative joint disease    Depression    DJD (degenerative joint disease)    Fatty liver    Fibromyalgia    GERD (gastroesophageal reflux disease)    Glaucoma    Headache(784.0)    Heart failure, diastolic, chronic (HCC)    Heart murmur    PMH:As a child only   Hernia of abdominal cavity    upper and lower hernia   History of hiatal hernia    HTN (hypertension)    Hypertension    Hypothyroidism    IBS (irritable bowel syndrome)    Morbid obesity (HCC)    Neuropathy    associated with diabetes   Obstructive sleep apnea    Pancreatitis    Pinched nerve in neck    Pneumonia    as a child   PONV (postoperative nausea and vomiting)    Pulmonary hypertension (HCC)    Rheumatic fever    PMH: as a child   Seizures (HCC)    PMH: only as a child   Seizures (HCC)     in childhood   Shortness of breath    Sleep apnea    Spinal stenosis    Stroke (HCC)    12/09/2013   Past Surgical History:  Past Surgical History:  Procedure Laterality Date   ABDOMINAL HYSTERECTOMY     ABDOMINAL HYSTERECTOMY     partial hysterectomy   APPENDECTOMY     BACK SURGERY     BACK SURGERY     disc surgery with complications and damage to right side   BARIATRIC SURGERY     DS in June 2020   BILATERAL KNEE ARTHROSCOPY  2000, 2009   BIOPSY  06/16/2021   Procedure: BIOPSY;  Surgeon: Eartha Angelia Sieving, MD;  Location: [facility];  Service: Gastroenterology;;   BREAST CYST EXCISION Left    CARDIAC CATHETERIZATION     2009   CATARACT EXTRACTION W/ INTRAOCULAR LENS  IMPLANT, BILATERAL     CHOLECYSTECTOMY  11/22/1992   CHOLECYSTECTOMY     COLONOSCOPY N/A 08/01/2013   Procedure: COLONOSCOPY;  Surgeon: Claudis RAYMOND Rivet, MD;  Location: [facility];  Service: Endoscopy;  Laterality: N/A;  930   COLONOSCOPY N/A 08/03/2017   Procedure: COLONOSCOPY;  Surgeon: Rivet Claudis RAYMOND, MD;  Location: [facility];  Service: Endoscopy;  Laterality: N/A;  12:00   COLONOSCOPY WITH PROPOFOL  N/A 06/28/2023   Procedure: COLONOSCOPY WITH PROPOFOL ;  Surgeon: Eartha Angelia Sieving, MD;  Location: [facility];  Service: Gastroenterology;  Laterality: N/A;  12:30pm;asa 3   CYST EXCISION     Left breast   ESOPHAGOGASTRODUODENOSCOPY (EGD) WITH PROPOFOL  N/A 06/16/2021   Procedure: ESOPHAGOGASTRODUODENOSCOPY (EGD) WITH PROPOFOL ;  Surgeon: Eartha Angelia Sieving, MD;  [facility]: [facility];  Service: Gastroenterology;  Laterality: N/A;  12:50   ESOPHAGOGASTRODUODENOSCOPY (EGD) WITH PROPOFOL  N/A 06/28/2023   Procedure: ESOPHAGOGASTRODUODENOSCOPY (EGD) WITH PROPOFOL ;  Surgeon: Eartha Angelia Sieving, MD;  Location: [facility];  Service: Gastroenterology;  Laterality: N/A;  12:30pm;asa 3   EXCISIONAL HEMORRHOIDECTOMY     EYE SURGERY  11/22/1965   EYE SURGERY     bilateral  cataract removal with lens implants   EYE SURGERY     growth removed from right eye lid   EYE SURGERY     bilateral eye surgery age 13 tto straighten eyes'   FEMUR IM NAIL Left 06/08/2024   Procedure: INSERTION, INTRAMEDULLARY ROD, FEMUR;  Surgeon: Celena Sharper, MD;  Location: [facility];  Service: Orthopedics;  Laterality: Left;   heel (other)  11/23/2003   HEEL SPUR SURGERY     Right heel - growth removal and rebuilding of heel   HEMORRHOID SURGERY     HERNIA REPAIR     KNEE ARTHROSCOPY Bilateral    LUMBAR LAMINECTOMY/DECOMPRESSION MICRODISCECTOMY Right 08/23/2014   Procedure: LUMBAR LAMINECTOMY/DECOMPRESSION MICRODISCECTOMY 1 LEVEL;  Surgeon: Victory DELENA Gunnels, MD;  Location: [facility];  Service: Neurosurgery;  Laterality: Right;  LUMBAR LAMINECTOMY/DECOMPRESSION MICRODISCECTOMY 1 LEVEL LUMBAR 2-3   LUMBAR PERCUTANEOUS PEDICLE SCREW 2 LEVEL N/A 06/08/2024   Procedure: THORACIC ELEVEN-LUMBAR ONE PERCUTANEOUS PEDICLE SCREW;  Surgeon: Debby Dorn MATSU, MD;  Location: [facility];  Service: Neurosurgery;  Laterality: N/A;  T11-L1 Percutaneous Fusion   ORIF FEMUR FRACTURE Left 06/08/2024   Procedure: OPEN REDUCTION INTERNAL FIXATION FEMORAL SHAFT FRACTURE;  Surgeon: Celena Sharper, MD;  Location: [facility];  Service: Orthopedics;  Laterality: Left;   ovaries removed  11/22/2001   PARTIAL HYSTERECTOMY  11/23/1979   SHOULDER ARTHROSCOPY WITH DISTAL CLAVICLE RESECTION Left 08/06/2022   Procedure: SHOULDER ARTHROSCOPY WITH DISTAL CLAVICLE RESECTION;  Surgeon: Sharl Selinda Dover, MD;  [facility];  Service: Orthopedics;  Laterality: Left;   SHOULDER ARTHROSCOPY WITH ROTATOR CUFF REPAIR Left 08/06/2022   Procedure: SHOULDER ARTHROSCOPY WITH ROTATOR CUFF REPAIR;  Surgeon: Sharl Selinda Dover, MD;  Location: [facility] OR;  Service: Orthopedics;  Laterality: Left;   SHOULDER ARTHROSCOPY WITH SUBACROMIAL DECOMPRESSION Left 08/06/2022   Procedure: SHOULDER ARTHROSCOPY WITH SUBACROMIAL DECOMPRESSIONWITH EXTENSIVE  DEBRIDEMENT;  Surgeon: Sharl Selinda Dover, MD;  Location: [facility] OR;  Service: Orthopedics;  Laterality: Left;   SPHINCTEROTOMY     spinahatomy  11/22/1990   TOTAL KNEE ARTHROPLASTY Right 11/05/2020   Procedure: RIGHT TOTAL KNEE ARTHROPLASTY;  Surgeon: Barbarann Oneil BROCKS, MD;  Location: [facility];  Service: Orthopedics;  Laterality: Right;   TUBAL LIGATION  11/22/1973   TUBAL LIGATION      Assessment & Plan Clinical Impression: Patient is a 68 year old female who was admitted on 06/08/24 after fall where she struck her head and onset of left hip and back pain with worsening of neuropathy LLE as well as numbness/tingling BUE. She was found to have displaced proximal left femur fracture, presacral hematoma with possible unusual Chance type Fx involving T 12 vertebral  body and spinous process, mild to moderate central spinal canal stenosis T10-T11, ORIF with IM nailing left femur by Dr. Celena and open reduction with pedicle screw/rod construct T11-T12- L1. Post op to wear TLSO when out of bed and is TTWB on LLE with recommendation of 2 week post op follow up on hip for imaging/sutures.   PMH: non-obst CAD, COPD, multiple back surgeries with lumbar radiculopathy, Bilateral quad weakness s/p R-TKR, macula degeneration (worse vision at nights)  chronic DDD C6/7 with mild to moderate central spine stenosis and moderate degenerative changes right shoulder. Patient transferred to CIR on 06/18/2024 .   Patient currently requires min with mobility secondary to muscle weakness and muscle joint tightness and decreased cardiorespiratoy endurance.  Prior to hospitalization, patient was independent  with mobility and lived with Spouse in a House home.  Home access is 1Stairs to enter.  Patient will benefit from skilled PT intervention to maximize safe functional mobility, minimize fall risk, and decrease caregiver burden for planned discharge home with 24 hour supervision.  Anticipate patient will benefit from follow up HH at  discharge.  PT - End of Session Activity Tolerance: Tolerates < 10 min activity, no significant change in vital signs Endurance Deficit: Yes PT Assessment Rehab Potential (ACUTE/IP ONLY): Good PT Barriers to Discharge: None PT Patient demonstrates impairments in the following area(s): Balance;Edema;Endurance;Motor;Pain;Safety;Skin Integrity PT Transfers Functional Problem(s): Bed Mobility;Bed to Chair;Car;Other (comment) PT Locomotion Functional Problem(s): Ambulation;Wheelchair Mobility;Stairs PT Plan PT Intensity: Minimum of 1-2 x/day ,45 to 90 minutes PT Frequency: 5 out of 7 days PT Duration Estimated Length of Stay: 7-10 days PT Treatment/Interventions: Ambulation/gait training;Community reintegration;Neuromuscular re-education;Psychosocial support;DME/adaptive equipment instruction;Stair training;UE/LE Strength taining/ROM;Wheelchair propulsion/positioning;UE/LE Coordination activities;Therapeutic Activities;Skin care/wound management;Pain management;Discharge planning;Balance/vestibular training;Cognitive remediation/compensation;Disease management/prevention;Functional mobility training;Patient/family education;Therapeutic Exercise;Visual/perceptual remediation/compensation PT Transfers Anticipated Outcome(s): supervision PT Locomotion Anticipated Outcome(s): supervision PT Recommendation Recommendations for Other Services: Therapeutic Recreation consult Therapeutic Recreation Interventions: Pet therapy;Stress management Follow Up Recommendations: Home health PT Patient destination: Home   PT Evaluation Precautions/Restrictions Precautions Precautions: Back Precaution Booklet Issued: Yes (comment) Recall of Precautions/Restrictions: Intact Precaution/Restrictions Comments: TTWB LLE, TSLO OOB, NO BLT Required Braces or Orthoses: Spinal Brace Spinal Brace: Thoracolumbosacral orthotic;Applied in sitting position Restrictions Weight Bearing Restrictions Per Provider Order:  Yes LLE Weight Bearing Per Provider Order: Touchdown weight bearing (TTWB LLE) Other Position/Activity Restrictions: TTWB LLE  Pain Pain Assessment Pain Scale: 0-10 Pain Score: 7  Pain Location: Back Pain Orientation: Lower Pain Descriptors / Indicators: Aching Pain Intervention(s): Medication (See eMAR) Multiple Pain Sites: Yes 2nd Pain Site Pain Score: 7 Pain Location: Leg Pain Orientation: Left Pain Onset: On-going Pain Interference Pain Interference Pain Effect on Sleep: 2. Occasionally Pain Interference with Therapy Activities: 1. Rarely or not at all Pain Interference with Day-to-Day Activities: 3. Frequently Home Living/Prior Functioning Home Living Available Help at Discharge: Family;Available 24 hours/day Type of Home: House Home Access: Stairs to enter [employer] of Steps: 1 Entrance Stairs-Rails: None Home Layout: One level Bathroom Shower/Tub: Armed forces operational officer Accessibility: Yes Additional Comments: has SPC, hemi RW, RW, W/C  Lives With: Spouse Prior Function Level of Independence: Independent with basic ADLs;Independent with transfers;Independent with gait  Able to Take Stairs?: Yes Driving: No Vocation: Retired Leisure: Hobbies-yes (Comment) Vision/Perception  Vision - History Ability to See in Adequate Light: 1 Impaired Perception Perception: Within Functional Limits Praxis Praxis: WFL  Cognition Overall Cognitive Status: Within Functional Limits for tasks assessed Arousal/Alertness: Awake/alert Orientation Level: Oriented X4 Year: 2025 Month: July Day of Week:  Correct Attention: Focused Focused Attention: Appears intact Memory: Appears intact Memory Impairment: Retrieval deficit;Decreased recall of new information;Decreased short term memory Awareness: Appears intact Problem Solving: Appears intact Safety/Judgment: Appears intact Comments: pt starting to feeling better about her memory this  week Sensation Sensation Light Touch: Impaired by gross assessment Hot/Cold: Appears Intact Proprioception: Impaired by gross assessment Stereognosis: Not tested Additional Comments: RLE numbness/tingling since before fall Coordination Gross Motor Movements are Fluid and Coordinated: No Fine Motor Movements are Fluid and Coordinated: No Coordination and Movement Description: d/t injuries and TTWB Motor  Motor Motor: Abnormal postural alignment and control   Trunk/Postural Assessment  Cervical Assessment Cervical Assessment: Within Functional Limits Thoracic Assessment Thoracic Assessment: Within Functional Limits Lumbar Assessment Lumbar Assessment: Exceptions to [facility] Postural Control Postural Control: Deficits on evaluation Postural Limitations: back precautions  Balance Balance Balance Assessed: Yes Dynamic Sitting Balance Sitting balance - Comments: with UE support Extremity Assessment  RUE Assessment RUE Assessment: Within Functional Limits Active Range of Motion (AROM) Comments: WFL General Strength Comments: 4-/5 overall LUE Assessment LUE Assessment: Within Functional Limits Active Range of Motion (AROM) Comments: WFL General Strength Comments: 4-/5 overall RLE Assessment RLE Assessment: Within Functional Limits LLE Assessment LLE Assessment: Exceptions to [facility] General Strength Comments: grossly 3-/5 LLE Strength LLE Overall Strength: Deficits;Due to pain  Care Tool Care Tool Bed Mobility Roll left and right activity   Roll left and right assist level: Contact Guard/Touching assist    Sit to lying activity   Sit to lying assist level: Contact Guard/Touching assist    Lying to sitting on side of bed activity   Lying to sitting on side of bed assist level: the ability to move from lying on the back to sitting on the side of the bed with no back support.: Contact Guard/Touching assist     Care Tool Transfers Sit to stand transfer   Sit to stand assist  level: Minimal Assistance - Patient > 75%    Chair/bed transfer   Chair/bed transfer assist level: Minimal Assistance - Patient > 75%    Car transfer    Car transfer Activity did not occur: Safety/medical concerns (pain, weakness)     Care Tool Locomotion Ambulation   Assist level: Contact Guard/Touching assist Assistive device: Walker-rolling Max distance: 12'  Walk 10 feet activity   Assist level: Contact Guard/Touching assist Assistive device: Walker-rolling   Walk 50 feet with 2 turns activity Walk 50 feet with 2 turns activity did not occur: Safety/medical concerns (pain, weakness)      Walk 150 feet activity Walk 150 feet activity did not occur: Safety/medical concerns (pain, weakness)      Walk 10 feet on uneven surfaces activity Walk 10 feet on uneven surfaces activity did not occur: Safety/medical concerns (pain, weakness)      Stairs Stair activity did not occur: Safety/medical concerns (pain, weakness, TTWB LLE)        Walk up/down 1 step activity Walk up/down 1 step or curb (drop down) activity did not occur: Safety/medical concerns (pain, weakness, TTWB LLE)      Walk up/down 4 steps activity Walk up/down 4 steps activity did not occur: Safety/medical concerns (pain, weakness, TTWB LLE)      Walk up/down 12 steps activity Walk up/down 12 steps activity did not occur: Safety/medical concerns (pain, weakness, TTWB LLE)      Pick up small objects from floor Pick up small object from the floor (from standing position) activity did not occur: Safety/medical concerns (pain, weakness, TTWB  LLE)      Wheelchair Is the patient using a wheelchair?: Yes Type of Wheelchair: Manual   Wheelchair assist level: Total Assistance - Patient < 25%    Wheel 50 feet with 2 turns activity Wheelchair 50 feet with 2 turns activity did not occur: Safety/medical concerns (pain, weakness, TTWB LLE)    Wheel 150 feet activity Wheelchair 150 feet activity did not occur: Safety/medical  concerns (pain, weakness, TTWB LLE)      Refer to Care Plan for Long Term Goals  SHORT TERM GOAL WEEK 1 PT Short Term Goal 1 (Week 1): = LTG  Recommendations for other services: Therapeutic Recreation  Pet therapy, Stress management, and Outing/community reintegration  Skilled Therapeutic Intervention Session1: Evaluation completed (see details above and below) with education on PT POC and goals and individual treatment initiated with focus on bed mobility, balance, transfers, car transfers, WC mobility, and ambulation. Pt in w/c with TLSO brace donned when PT arrived, agrees to therapy. Reports moderate pain 7-9/10 when moving, at rest 7/10 to LB. Pain meds administered around 8am, per pt.  PT provides rest breaks as needed. Pt completes sit<->stand transfers from w/c<->RW with min assist, standing with TTWB. Gait with RW x10' min assist cueing to correct gait pattern/sequencing.  Sit->supine EOB with bed features and cues for positioning min assist for LE placement. Pt's movements are antalgic, slow. Due to pt's increased pain levels, session ended with pt in bed (pt request), all items within reach.   Session 2: Pt in bed when PT arrived for afternoon session.  Pt agreeable. Pt reports no change in LBP.  Pt performed supine LE ex's: hip abd/add x15, KTC x15, quad sets x3 holds x15, HL knee adduction x3 holds x20, glue squeezes x3 holds x15.  Assist LLE as needed.  Educated pt on positioning to avoid excess stress/force to L/S, POC.  Pt very pleasant and willing to participate with therapy as tolerated.  Pt presents with moderate/severe mm spasms with slight trunk movement/bed mobility, will resolve slowly at rest. PT provided cryotherapy to LB/left upper thigh x15'.  Session ended with pt in bed with all items within reach.   Mobility Bed Mobility Bed Mobility: Rolling Left;Sit to Supine;Rolling Right Rolling Right: Contact Guard/Touching assist Rolling Left: Contact Guard/Touching  assist Supine to Sit: Contact Guard/Touching assist Sit to Supine: Contact Guard/Touching assist Transfers Transfers: Sit to Stand;Stand to Sit;Stand Pivot Transfers Sit to Stand: Minimal Assistance - Patient > 75% Stand to Sit: Minimal Assistance - Patient > 75% Stand Pivot Transfers: Minimal Assistance - Patient > 75% Stand Pivot Transfer Details: Verbal cues for precautions/safety;Tactile cues for weight beaing Transfer (Assistive device): Rolling walker Locomotion  Gait Ambulation: Yes Gait Assistance: Minimal Assistance - Patient > 75% Gait Distance (Feet): 12 Feet Assistive device: Rolling walker Gait Assistance Details: Tactile cues for weight shifting;Verbal cues for precautions/safety;Verbal cues for safe use of DME/AE;Verbal cues for technique;Verbal cues for gait pattern Gait Gait: Yes Gait Pattern: Impaired Gait Pattern: Step-to pattern;Decreased step length - left;Decreased stride length;Shuffle Gait velocity: reduced Stairs / Additional Locomotion Stairs: No (pain, weakness, TTWB LLE) Wheelchair Mobility Wheelchair Mobility: No (pain, weakness, TTWB LLE)   Discharge Criteria: Patient will be discharged from PT if patient refuses treatment 3 consecutive times without medical reason, if treatment goals not met, if there is a change in medical status, if patient makes no progress towards goals or if patient is discharged from hospital.  The above assessment, treatment plan, treatment alternatives and goals were discussed  and mutually agreed upon: by patient  [name] Fast 06/19/2024, 12:59 PM

## 2024-06-19 NOTE — Progress Notes (Signed)
 Met with patient to review current situation, team conference and plan of care. Reviewed medications, pain meds, toileting assistance bowel and bladder. Reviewed brace application when OOB , weight bearing precautions and incision care. Continue to follow along to provide educational needs to facilitate preparation for discharge.

## 2024-06-19 NOTE — Progress Notes (Signed)
 Inpatient Rehabilitation Admission Medication Review by a Pharmacist  A complete drug regimen review was completed for this patient to identify any potential clinically significant medication issues.  High Risk Drug Classes Is patient taking? Indication by Medication  Antipsychotic No   Anticoagulant Yes, as an intravenous medication Enoxaparin  - VTE prophylaxis  Antibiotic No   Opioid Yes Hydrocodone /acetaminophen  - acute pain   Antiplatelet No   Hypoglycemics/insulin  No   Vasoactive Medication No   Chemotherapy No   Other Yes bisacodyl   and - constipation Maalox- indigestion Pantoprazole - GERD Famotidine - GERD Diphenhydramine - itching  Acetaminophen - pain  Robitussin- cough  Zofran - n/v  Robaxin - muscle spasms  gabapentin - neuropathy  albuterol - wheezing/SOB  Tums - reflux melatonin - sleep  Buspirone  - anxiety  Breztri  - COPD  Cholecalciferol  (Vit D3), ergocalciferol  - vit D supplementation Levothyroxine  - hypothyroidism Spironolactone  - diuretic for history of cirrhosis     Type of Medication Issue Identified Description of Issue Recommendation(s)  Drug Interaction(s) (clinically significant)     Duplicate Therapy     Allergy     No Medication Administration End Date     Incorrect Dose     Additional Drug Therapy Needed     Significant med changes from prior encounter (inform family/care partners about these prior to discharge). Trelegy ellipta PTA, on Breztri  CIR  Medications on PTA list not ordered in CIR: cyclobenzaprine , furosemide  (held due to hypotension) zolpidem , denosumab , copper  gluconate  Communicate relevant medication changes to patient/family members at discharge from CIR.   Restart or discontinue PTA meds not resumed in CIR at discharge if clinically indicated.   Other       Clinically significant medication issues were identified that warrant physician communication and completion of prescribed/recommended actions by midnight of the next  day:  No  Name of provider notified for urgent issues identified:   Provider Method of Notification:   Pharmacist comments:   Time spent performing this drug regimen review (minutes):  30  Feliciano Close, PharmD PGY2 Infectious Diseases Pharmacy Resident

## 2024-06-19 NOTE — Patient Care Conference (Signed)
 Inpatient RehabilitationTeam Conference and Plan of Care Update Date: 06/20/2024   Time: 1152 am    Patient Name: Kimberly [name]      Medical Record Number: [id number]  Date of Birth: [birth year] Sex: Female         Room/Bed: 4W17C/4W17C-01 Payor Info: Payor: [employer] MEDICARE / Plan: [facility] MEDICARE / Product Type: *No Product type* /    Admit Date/Time:  06/18/2024  2:33 PM  Primary Diagnosis:  Thoracic spine fracture [facility])  Hospital Problems: Principal Problem:   Thoracic spine fracture [facility]) Active Problems:   Femur fracture, left [facility] [address]    Expected Discharge Date: Expected Discharge Date: 06/29/24  Team Members Present: Physician leading conference: Dr. Duwaine Barrs Social Worker Present: Rhoda Clement, LCSW Nurse Present: Eulalio Falls, RN PT Present: Other (comment) Lessie Fast, PT) OT Present: Camie Hoe, OT SLP Present: Recardo Mole, SLP Other (Discipline and Name): Cara Daring , ADN     Current Status/Progress Goal Weekly Team Focus  Bowel/Bladder      Continent of bowels and incontinence/retention  of bladder    Remain continent of bowel and bladder    Assess bowel and bladder q shift  Swallow/Nutrition/ Hydration               ADL's   CGA-min A for ADLs, CGA for bed mobility via log roll technique and CGA for transfers with RW.   mod I   transfer training, pain management, LB ADL retraining    Mobility   min assist transfers, gait min assist with RW   mod indep with gait RW, transfers  endurance, strengthening, neuro re-ed, gait tx, pain management    Communication                Safety/Cognition/ Behavioral Observations               Pain    Pain LLE with scheduled pain medications   <4 w/ prns     Assess pain q shift  Skin    Bruising on left thigh  Incision left leg with sutures s/p IM nailing  covered with foam Incision back OTA   Skin remain free of infection    Assess incision sites q shift    Discharge  Planning:  New evaluation-home with husband who is able to assist and has a year ago after antoehr fall with fractures. Has wc, hemi walker and rw    Team Discussion: Patient admitted post  polytrauma after a fall at home s/p ORIF LLE.  Patient with  hypotension, pain/spasms: medications adjusted by MD.   Patient on target to meet rehab goals: Currently patient needs CGA with upper body care and  minimal assistance with lower body care. Patient needs minimal assistance with transfers and able to ambulate with minimal assistance using a rolling walker. Over all goals at discharge are set for Mod I independence.   *See Care Plan and progress notes for long and short-term goals.   Revisions to Treatment Plan:  TDWB LLE  with follow up x-ray in two weeks TEDS TLSO brace Bladder scan q 6 hours  Teaching Needs: Safety, medications, weight bearing restrictions, transfers, toileting, etc.   Current Barriers to Discharge: Decreased caregiver support, Home enviroment access/layout, Neurogenic bowel and bladder, Wound care, and Weight bearing restrictions  Possible Resolutions to Barriers: Family Education DME: W/C, hemi walker, RW     Medical Summary Current Status: L femur fx TDWB; CHance fx- s/p surgery on both; dressing for incisions;  incontinet of B/B;  Barriers to Discharge: Medical stability;Other (comments);Self-care education;Weight bearing restrictions;Uncontrolled Pain;Hypotension;Incontinence;Behavior/Mood  Barriers to Discharge Comments: dumping syndrome- possible urinary retention- bowel and bladder incontinence some at baseline; pain limiting; hx of Fibromyalgia; pain 7-8/10 at all times; TDWB on LLE- back preacutions Possible Resolutions to [employer] Focus: has Norco prn and scheduled- and added Buspar  for anxiety- d/c 06/29/24   Continued Need for Acute Rehabilitation Level of Care: The patient requires daily medical management by a physician with specialized training in  physical medicine and rehabilitation for the following reasons: Direction of a multidisciplinary physical rehabilitation program to maximize functional independence : Yes Medical management of patient stability for increased activity during participation in an intensive rehabilitation regime.: Yes Analysis of laboratory values and/or radiology reports with any subsequent need for medication adjustment and/or medical intervention. : Yes   I attest that I was present, lead the team conference, and concur with the assessment and plan of the team.   Yasmene Salomone Gayo 06/19/2024, 1152 am

## 2024-06-19 NOTE — Progress Notes (Signed)
 Inpatient Rehabilitation Center Individual Statement of Services  Patient Name:  Kimberly [name]  Date:  06/19/2024  Welcome to the Inpatient Rehabilitation Center.  Our goal is to provide you with an individualized program based on your diagnosis and situation, designed to meet your specific needs.  With this comprehensive rehabilitation program, you will be expected to participate in at least 3 hours of rehabilitation therapies Monday-Friday, with modified therapy programming on the weekends.  Your rehabilitation program will include the following services:  Physical Therapy (PT), Occupational Therapy (OT), 24 hour per day rehabilitation nursing, Therapeutic Recreaction (TR), Care Coordinator, Rehabilitation Medicine, Nutrition Services, and Pharmacy Services  Weekly team conferences will be held on Tuesday to discuss your progress.  Your Inpatient Rehabilitation Care Coordinator will talk with you frequently to get your input and to update you on team discussions.  Team conferences with you and your family in attendance may also be held.  Expected length of stay: 7-10 days  Overall anticipated outcome: mod/I level  Depending on your progress and recovery, your program may change. Your Inpatient Rehabilitation Care Coordinator will coordinate services and will keep you informed of any changes. Your Inpatient Rehabilitation Care Coordinator's name and contact numbers are listed  below.  The following services may also be recommended but are not provided by the Inpatient Rehabilitation Center:   Home Health Rehabiltiation Services Outpatient Rehabilitation Services    Arrangements will be made to provide these services after discharge if needed.  Arrangements include referral to agencies that provide these services.  Your insurance has been verified to be:  [facility] Medicare Your primary doctor is:  Marcy Pouch  Pertinent information will be shared with your doctor and your insurance  company.  Inpatient Rehabilitation Care Coordinator:  Rhoda Clement, KEN [phone] or ELIGAH BASQUES  Information discussed with and copy given to patient by: Clement Asberry MATSU, 06/19/2024, 9:08 AM

## 2024-06-19 NOTE — Progress Notes (Signed)
 Inpatient Rehabilitation  Patient information reviewed and entered into eRehab system by [facility]. Karen Kays., CCC/SLP, PPS Coordinator.  Information including medical coding, functional ability and quality indicators will be reviewed and updated through discharge.

## 2024-06-19 NOTE — Progress Notes (Signed)
 Patient ID: Kimberly [name], female   DOB: March 18, 1956, 68 y.o.   MRN: [id number]  Met with pt to give her team conference up date regarding goals of mod/I and discharge target 8/8. Pt voiced she is having a lot of pain and this will limit her in therapies. She was somewhat concerned regarding only having half day of therapies and team setting a target discharge date. Discussed this can be changed and will continue to see how she does in therapies.

## 2024-06-19 NOTE — Evaluation (Signed)
 Occupational Therapy Assessment and Plan  Patient Details  Name: Kimberly [name] MRN: [id number] Date of Birth: 27-Mar-1956  OT Diagnosis: abnormal posture, acute pain, lumbago (low back pain), muscle weakness (generalized), and swelling of limb Rehab Potential: Rehab Potential (ACUTE ONLY): Good ELOS: 7-10 days   Today's Date: 06/19/2024 OT Individual Time: 0850-1010 OT Individual Time Calculation (min): 80 min     Hospital Problem: Principal Problem:   Thoracic spine fracture (HCC) Active Problems:   Femur fracture, left (HCC)   Past Medical History:  Past Medical History:  Diagnosis Date   Anemia    PMH: as a child   Arthritis    Asthma    CAD (coronary artery disease)    nonobstructive by cath, 6/08 (false positive Cardiolite ) normal stress echo, 7/11   CHF (congestive heart failure) (HCC)    Chronic back pain    Chronic bronchitis (HCC)    Cirrhosis (HCC)    Complication of anesthesia    had an asthma attack when woke up from procedure   Complication of anesthesia    asthma attack with one surgery   COPD (chronic obstructive pulmonary disease) (HCC)    Degenerative joint disease    Depression    DJD (degenerative joint disease)    Fatty liver    Fibromyalgia    GERD (gastroesophageal reflux disease)    Glaucoma    Headache(784.0)    Heart failure, diastolic, chronic (HCC)    Heart murmur    PMH:As a child only   Hernia of abdominal cavity    upper and lower hernia   History of hiatal hernia    HTN (hypertension)    Hypertension    Hypothyroidism    IBS (irritable bowel syndrome)    Morbid obesity (HCC)    Neuropathy    associated with diabetes   Obstructive sleep apnea    Pancreatitis    Pinched nerve in neck    Pneumonia    as a child   PONV (postoperative nausea and vomiting)    Pulmonary hypertension (HCC)    Rheumatic fever    PMH: as a child   Seizures (HCC)    PMH: only as a child   Seizures (HCC)    in childhood   Shortness of  breath    Sleep apnea    Spinal stenosis    Stroke (HCC)    12/09/2013   Past Surgical History:  Past Surgical History:  Procedure Laterality Date   ABDOMINAL HYSTERECTOMY     ABDOMINAL HYSTERECTOMY     partial hysterectomy   APPENDECTOMY     BACK SURGERY     BACK SURGERY     disc surgery with complications and damage to right side   BARIATRIC SURGERY     DS in June 2020   BILATERAL KNEE ARTHROSCOPY  2000, 2009   BIOPSY  06/16/2021   Procedure: BIOPSY;  Surgeon: Eartha Angelia Sieving, MD;  Location: [facility];  Service: Gastroenterology;;   BREAST CYST EXCISION Left    CARDIAC CATHETERIZATION     2009   CATARACT EXTRACTION W/ INTRAOCULAR LENS  IMPLANT, BILATERAL     CHOLECYSTECTOMY  11/22/1992   CHOLECYSTECTOMY     COLONOSCOPY N/A 08/01/2013   Procedure: COLONOSCOPY;  Surgeon: Claudis RAYMOND Rivet, MD;  Location: [facility];  Service: Endoscopy;  Laterality: N/A;  930   COLONOSCOPY N/A 08/03/2017   Procedure: COLONOSCOPY;  Surgeon: Rivet Claudis RAYMOND, MD;  Location: [facility];  Service: Endoscopy;  Laterality: N/A;  12:00   COLONOSCOPY WITH PROPOFOL  N/A 06/28/2023   Procedure: COLONOSCOPY WITH PROPOFOL ;  Surgeon: Eartha Angelia Sieving, MD;  Location: [facility];  Service: Gastroenterology;  Laterality: N/A;  12:30pm;asa 3   CYST EXCISION     Left breast   ESOPHAGOGASTRODUODENOSCOPY (EGD) WITH PROPOFOL  N/A 06/16/2021   Procedure: ESOPHAGOGASTRODUODENOSCOPY (EGD) WITH PROPOFOL ;  Surgeon: Eartha Angelia Sieving, MD;  [facility]: [facility];  Service: Gastroenterology;  Laterality: N/A;  12:50   ESOPHAGOGASTRODUODENOSCOPY (EGD) WITH PROPOFOL  N/A 06/28/2023   Procedure: ESOPHAGOGASTRODUODENOSCOPY (EGD) WITH PROPOFOL ;  Surgeon: Eartha Angelia Sieving, MD;  Location: [facility];  Service: Gastroenterology;  Laterality: N/A;  12:30pm;asa 3   EXCISIONAL HEMORRHOIDECTOMY     EYE SURGERY  11/22/1965   EYE SURGERY     bilateral cataract removal with lens implants    EYE SURGERY     growth removed from right eye lid   EYE SURGERY     bilateral eye surgery age 30 tto straighten eyes'   FEMUR IM NAIL Left 06/08/2024   Procedure: INSERTION, INTRAMEDULLARY ROD, FEMUR;  Surgeon: Celena Sharper, MD;  Location: [facility];  Service: Orthopedics;  Laterality: Left;   heel (other)  11/23/2003   HEEL SPUR SURGERY     Right heel - growth removal and rebuilding of heel   HEMORRHOID SURGERY     HERNIA REPAIR     KNEE ARTHROSCOPY Bilateral    LUMBAR LAMINECTOMY/DECOMPRESSION MICRODISCECTOMY Right 08/23/2014   Procedure: LUMBAR LAMINECTOMY/DECOMPRESSION MICRODISCECTOMY 1 LEVEL;  Surgeon: Victory DELENA Gunnels, MD;  Location: [facility];  Service: Neurosurgery;  Laterality: Right;  LUMBAR LAMINECTOMY/DECOMPRESSION MICRODISCECTOMY 1 LEVEL LUMBAR 2-3   LUMBAR PERCUTANEOUS PEDICLE SCREW 2 LEVEL N/A 06/08/2024   Procedure: THORACIC ELEVEN-LUMBAR ONE PERCUTANEOUS PEDICLE SCREW;  Surgeon: Debby Dorn MATSU, MD;  Location: [facility];  Service: Neurosurgery;  Laterality: N/A;  T11-L1 Percutaneous Fusion   ORIF FEMUR FRACTURE Left 06/08/2024   Procedure: OPEN REDUCTION INTERNAL FIXATION FEMORAL SHAFT FRACTURE;  Surgeon: Celena Sharper, MD;  Location: [facility];  Service: Orthopedics;  Laterality: Left;   ovaries removed  11/22/2001   PARTIAL HYSTERECTOMY  11/23/1979   SHOULDER ARTHROSCOPY WITH DISTAL CLAVICLE RESECTION Left 08/06/2022   Procedure: SHOULDER ARTHROSCOPY WITH DISTAL CLAVICLE RESECTION;  Surgeon: Sharl Selinda Dover, MD;  Location: [facility] OR;  Service: Orthopedics;  Laterality: Left;   SHOULDER ARTHROSCOPY WITH ROTATOR CUFF REPAIR Left 08/06/2022   Procedure: SHOULDER ARTHROSCOPY WITH ROTATOR CUFF REPAIR;  Surgeon: Sharl Selinda Dover, MD;  Location: [facility] OR;  Service: Orthopedics;  Laterality: Left;   SHOULDER ARTHROSCOPY WITH SUBACROMIAL DECOMPRESSION Left 08/06/2022   Procedure: SHOULDER ARTHROSCOPY WITH SUBACROMIAL DECOMPRESSIONWITH EXTENSIVE DEBRIDEMENT;  Surgeon: Sharl Selinda Dover, MD;  Location: [facility] OR;  Service: Orthopedics;  Laterality: Left;   SPHINCTEROTOMY     spinahatomy  11/22/1990   TOTAL KNEE ARTHROPLASTY Right 11/05/2020   Procedure: RIGHT TOTAL KNEE ARTHROPLASTY;  Surgeon: Barbarann Oneil BROCKS, MD;  Location: [facility];  Service: Orthopedics;  Laterality: Right;   TUBAL LIGATION  11/22/1973   TUBAL LIGATION      Assessment & Plan Clinical Impression: Flavia Bruss is a 68 year old female with history of non-obst CAD, COPD, cirrhosis of liver secondary to NASH, multiple back surgeries with lumbar radiculopathy, Bilateral quad weakness s/p R-TKR and pending replacement on the left, obesity s/p duodenal switch with resultant N/V/B/B incontinence, thrombocytopenia, macula degeneration (worse vision at nights) who was admitted on 06/08/24 after fall where she struck her head and onset  of left hip and back pain with worsening of neuropathy LLE as well as numbness/tingling BUE. She had recently been started on gabapentin  and ambien  per reports. She was found to have displaced proximal left femur fracture, presacral hematoma with possible unusual Chance type Fx involving T 12 vertebral body and spinous process, mild to moderate central spinal canal stenosis T10-T11,   chronic DDD C6/7 with mild to moderate central spine stenosis and moderate degenerative changes right shoulder. She was taken to OR for ORIF with IM nailing left femur by Dr. Celena and open reduction with pedicle screw/rod construct T11-T12- L1 by Dr. Debby on the same day. Post op to wear TLSO when out of bed and is TTWB on LLE with recommendation of 2 week post op follow up on hip for imaging/sutures.     Hospital course significant for constipation which has resolved with addition of laxatives. BS being monitored due to history of hypoglycemia since DSS and follow up CBC showed pancytopenia. BMET not checked since admission. Vitamin D  deficiency noted @ 19.97. PT/OT was consulted and has been working with  patient who is showing improvement in activity tolerance. She requires CGA to min assist with ADLS and mobility. She was independent PTA and CIR recommended due to functional decline. .  Patient transferred to CIR on 06/18/2024 .    Patient currently requires min with basic self-care skills secondary to muscle weakness and muscle joint tightness, decreased cardiorespiratoy endurance, and decreased standing balance, decreased postural control, and decreased balance strategies.  Prior to hospitalization, patient could complete ADLs with modified independent .  Patient will benefit from skilled intervention to decrease level of assist with basic self-care skills and increase independence with basic self-care skills prior to discharge home with care partner.  Anticipate patient will require intermittent supervision and follow up outpatient.  OT - End of Session Activity Tolerance: Tolerates 30+ min activity with multiple rests Endurance Deficit: Yes OT Assessment Rehab Potential (ACUTE ONLY): Good OT Barriers to Discharge: Weight bearing restrictions;Neurogenic Bowel & Bladder OT Patient demonstrates impairments in the following area(s): Balance;Endurance;Motor;Pain;Safety;Sensory;Skin Integrity OT Basic ADL's Functional Problem(s): Eating;Grooming;Bathing;Dressing;Toileting OT Advanced ADL's Functional Problem(s): None OT Transfers Functional Problem(s): Toilet;Tub/Shower OT Additional Impairment(s): None OT Plan OT Intensity: Minimum of 1-2 x/day, 45 to 90 minutes OT Frequency: 5 out of 7 days OT Duration/Estimated Length of Stay: 7-10 days OT Treatment/Interventions: Balance/vestibular training;DME/adaptive equipment instruction;Patient/family education;Therapeutic Activities;Wheelchair propulsion/positioning;Cognitive remediation/compensation;Functional electrical stimulation;Psychosocial support;Therapeutic Exercise;Community reintegration;Functional mobility training;Self Care/advanced ADL  retraining;UE/LE Strength taining/ROM;Discharge planning;Neuromuscular re-education;Skin care/wound managment;UE/LE Coordination activities;Visual/perceptual remediation/compensation;Splinting/orthotics;Disease mangement/prevention;Pain management OT Self Feeding Anticipated Outcome(s): Mod I OT Basic Self-Care Anticipated Outcome(s): Mod I OT Toileting Anticipated Outcome(s): Mod I OT Bathroom Transfers Anticipated Outcome(s): Mod I OT Recommendation Recommendations for Other Services: Therapeutic Recreation consult Therapeutic Recreation Interventions: Pet therapy Patient destination: Home Follow Up Recommendations: Outpatient OT Equipment Recommended: To be determined;3 in 1 bedside comode;Tub/shower bench   OT Evaluation Precautions/Restrictions  Precautions Precautions: Back Precaution Booklet Issued: Yes (comment) Recall of Precautions/Restrictions: Intact Precaution/Restrictions Comments: TTWB LLE, TSLO OOB, NO BLT Required Braces or Orthoses: Spinal Brace Spinal Brace: Thoracolumbosacral orthotic;Applied in sitting position Restrictions Weight Bearing Restrictions Per Provider Order: Yes LLE Weight Bearing Per Provider Order: Touchdown weight bearing (TTWB LLE) Other Position/Activity Restrictions: TTWB LLE General Chart Reviewed: Yes Response to Previous Treatment: Not applicable Family/Caregiver Present: No Pain Pain Assessment Pain Scale: 0-10 Pain Score: 7  Pain Location: Back Pain Orientation: Lower Pain Descriptors / Indicators: Aching Pain Intervention(s): Environmental changes;Relaxation;Rest;RN made aware;Repositioned;Emotional support  Multiple Pain Sites: Yes 2nd Pain Site Pain Score: 7 Pain Location: Leg Pain Orientation: Left Pain Onset: On-going Home Living/Prior Functioning Home Living Family/patient expects to be discharged to:: Private residence Living Arrangements: Spouse/significant other, Other relatives Available Help at Discharge: Family,  Available 24 hours/day Type of Home: House Home Access: Stairs to enter [employer] of Steps: 1 Entrance Stairs-Rails: None Home Layout: One level Bathroom Shower/Tub: Armed forces operational officer Accessibility: Yes Additional Comments: has SPC, hemi RW, RW, W/C  Lives With: Spouse IADL History Homemaking Responsibilities: Yes Meal Prep Responsibility: Secondary Laundry Responsibility: Secondary Cleaning Responsibility: Secondary Homemaking Comments: assistance from husband, splitting chores Current License: No Occupation: Retired 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, Insurance risk surveyor Prior Function Level of Independence: Independent with basic ADLs, Independent with transfers, Independent with gait  Able to Take Stairs?: Yes Driving: No Vocation: Retired Leisure: Hobbies-yes (Comment) Vision Baseline Vision/History: 6 Macular Degeneration;1 Wears glasses Ability to See in Adequate Light: 1 Impaired Patient Visual Report: No change from baseline Vision Assessment?: Wears glasses for reading Perception  Perception: Within Functional Limits Praxis Praxis: WFL Cognition Cognition Overall Cognitive Status: Within Functional Limits for tasks assessed Arousal/Alertness: Awake/alert Orientation Level: Person;Place;Situation Person: Oriented Place: Oriented Situation: Oriented Memory: Appears intact Memory Impairment: Retrieval deficit;Decreased recall of new information;Decreased short term memory Attention: Focused Focused Attention: Appears intact Awareness: Appears intact Problem Solving: Appears intact Safety/Judgment: Appears intact Comments: pt starting to feeling better about her memory this week Brief Interview for Mental Status (BIMS) Repetition of Three Words (First Attempt): 3 Temporal Orientation: Year: Correct Temporal Orientation: Month: Accurate within 5 days Temporal Orientation: Day: Correct Recall: Sock: Yes, no cue  required Recall: Blue: Yes, no cue required Recall: Bed: Yes, no cue required BIMS Summary Score: 15 Sensation Sensation Light Touch: Impaired by gross assessment Hot/Cold: Appears Intact Proprioception: Impaired by gross assessment Stereognosis: Not tested Additional Comments: RLE numbness/tingling since before fall Coordination Gross Motor Movements are Fluid and Coordinated: No Fine Motor Movements are Fluid and Coordinated: No Coordination and Movement Description: d/t injuries and TTWB Motor  Motor Motor: Abnormal postural alignment and control  Trunk/Postural Assessment  Cervical Assessment Cervical Assessment: Within Functional Limits Thoracic Assessment Thoracic Assessment: Within Functional Limits Lumbar Assessment Lumbar Assessment: Exceptions to [facility] Postural Control Postural Control: Deficits on evaluation Postural Limitations: back precautions  Balance Balance Balance Assessed: Yes Dynamic Sitting Balance Sitting balance - Comments: with UE support Extremity/Trunk Assessment RUE Assessment RUE Assessment: Within Functional Limits Active Range of Motion (AROM) Comments: WFL General Strength Comments: 4-/5 overall LUE Assessment LUE Assessment: Within Functional Limits Active Range of Motion (AROM) Comments: WFL General Strength Comments: 4-/5 overall  Care Tool Care Tool Self Care Eating   Eating Assist Level: Set up assist    Oral Care    Oral Care Assist Level: Set up assist    Bathing         Assist Level: Minimal Assistance - Patient > 75%    Upper Body Dressing(including orthotics)       Assist Level: Contact Guard/Touching assist    Lower Body Dressing (excluding footwear)     Assist for lower body dressing: Minimal Assistance - Patient > 75%    Putting on/Taking off footwear     Assist for footwear: Total Assistance - Patient < 25%       Care Tool Toileting Toileting activity   Assist for toileting: Minimal Assistance -  Patient > 75%     Care Tool Bed Mobility  Roll left and right activity   Roll left and right assist level: Contact Guard/Touching assist    Sit to lying activity   Sit to lying assist level: Contact Guard/Touching assist    Lying to sitting on side of bed activity   Lying to sitting on side of bed assist level: the ability to move from lying on the back to sitting on the side of the bed with no back support.: Contact Guard/Touching assist     Care Tool Transfers Sit to stand transfer   Sit to stand assist level: Minimal Assistance - Patient > 75%    Chair/bed transfer   Chair/bed transfer assist level: Minimal Assistance - Patient > 75%     Toilet transfer   Assist Level: Minimal Assistance - Patient > 75%     Care Tool Cognition  Expression of Ideas and Wants Expression of Ideas and Wants: 4. Without difficulty (complex and basic) - expresses complex messages without difficulty and with speech that is clear and easy to understand  Understanding Verbal and Non-Verbal Content Understanding Verbal and Non-Verbal Content: 4. Understands (complex and basic) - clear comprehension without cues or repetitions   Memory/Recall Ability Memory/Recall Ability : Current season;Location of own room;Staff names and faces;That he or she is in a hospital/hospital unit   Refer to Care Plan for Long Term Goals  SHORT TERM GOAL WEEK 1 OT Short Term Goal 1 (Week 1): Pt will complete toileting at CGA with LRAD OT Short Term Goal 2 (Week 1): Pt will complete LB dressing at CGA with AE as necessary OT Short Term Goal 3 (Week 1): Pt will complete transfera at [facility] with LRAD  Recommendations for other services: Therapeutic Recreation  Pet therapy   Skilled Therapeutic Intervention ADL ADL Eating: Set up Grooming: Setup (assistance with reaching at back of sink d/t precautions) Where Assessed-Grooming: Sitting at sink Upper Body Bathing: Contact guard Where Assessed-Upper Body Bathing: Sitting at  sink Lower Body Bathing: Minimal assistance Where Assessed-Lower Body Bathing: Sitting at sink Upper Body Dressing: Contact guard (including orthosis) Where Assessed-Upper Body Dressing: Edge of bed Lower Body Dressing: Minimal assistance (no reacher in room, able to don over RLE, required assistance with LLE) Where Assessed-Lower Body Dressing: Edge of bed Toileting: Minimal assistance Toilet Transfer: Minimal assistance Toilet Transfer Method: Surveyor, minerals: Gaffer: Not assessed Tub/Shower Transfer Method: Unable to assess [employer] Transfer: Unable to assess;Not assessed Walk-In Shower Transfer Method: Unable to assess ADL Comments: pt able to follow TTWB during transfers and able to manage TLSO at EOB, able to state 3/3 back precautions Mobility  Bed Mobility Bed Mobility: Rolling Left;Sit to Supine;Rolling Right Rolling Right: Contact Guard/Touching assist Rolling Left: Contact Guard/Touching assist Supine to Sit: Contact Guard/Touching assist Sit to Supine: Contact Guard/Touching assist Transfers Sit to Stand: Minimal Assistance - Patient > 75% Stand to Sit: Minimal Assistance - Patient > 75%  1:1 evaluation and treatment session initiated this date. OT roles, goals and purpose discussed with pt as well as therapy schedule. ADL completed this date with levels of assist listed above. Pt would benefit from skilled OT in IPR setting in order to maximize independence with ADLs upon D/C.   Discharge Criteria: Patient will be discharged from OT if patient refuses treatment 3 consecutive times without medical reason, if treatment goals not met, if there is a change in medical status, if patient makes no progress towards goals or if patient is discharged from hospital.  The  above assessment, treatment plan, treatment alternatives and goals were discussed and mutually agreed upon: by patient  Camie Hoe, OTD, OTR/L 06/19/2024,  12:16 PM

## 2024-06-19 NOTE — Progress Notes (Signed)
 Inpatient Rehabilitation Care Coordinator Assessment and Plan Patient Details  Name: Kimberly [name] MRN: [id number] Date of Birth: 02/14/1956  Today's Date: 06/19/2024  Hospital Problems: Principal Problem:   Thoracic spine fracture [facility]) Active Problems:   Femur fracture, left (HCC)  Past Medical History:  Past Medical History:  Diagnosis Date   Anemia    PMH: as a child   Arthritis    Asthma    CAD (coronary artery disease)    nonobstructive by cath, 6/08 (false positive Cardiolite ) normal stress echo, 7/11   CHF (congestive heart failure) (HCC)    Chronic back pain    Chronic bronchitis (HCC)    Cirrhosis (HCC)    Complication of anesthesia    had an asthma attack when woke up from procedure   Complication of anesthesia    asthma attack with one surgery   COPD (chronic obstructive pulmonary disease) (HCC)    Degenerative joint disease    Depression    DJD (degenerative joint disease)    Fatty liver    Fibromyalgia    GERD (gastroesophageal reflux disease)    Glaucoma    Headache(784.0)    Heart failure, diastolic, chronic (HCC)    Heart murmur    PMH:As a child only   Hernia of abdominal cavity    upper and lower hernia   History of hiatal hernia    HTN (hypertension)    Hypertension    Hypothyroidism    IBS (irritable bowel syndrome)    Morbid obesity (HCC)    Neuropathy    associated with diabetes   Obstructive sleep apnea    Pancreatitis    Pinched nerve in neck    Pneumonia    as a child   PONV (postoperative nausea and vomiting)    Pulmonary hypertension (HCC)    Rheumatic fever    PMH: as a child   Seizures (HCC)    PMH: only as a child   Seizures (HCC)    in childhood   Shortness of breath    Sleep apnea    Spinal stenosis    Stroke (HCC)    12/09/2013   Past Surgical History:  Past Surgical History:  Procedure Laterality Date   ABDOMINAL HYSTERECTOMY     ABDOMINAL HYSTERECTOMY     partial hysterectomy   APPENDECTOMY     BACK  SURGERY     BACK SURGERY     disc surgery with complications and damage to right side   BARIATRIC SURGERY     DS in June 2020   BILATERAL KNEE ARTHROSCOPY  2000, 2009   BIOPSY  06/16/2021   Procedure: BIOPSY;  Surgeon: Eartha Angelia Sieving, MD;  [facility]: [facility];  Service: Gastroenterology;;   BREAST CYST EXCISION Left    CARDIAC CATHETERIZATION     2009   CATARACT EXTRACTION W/ INTRAOCULAR LENS  IMPLANT, BILATERAL     CHOLECYSTECTOMY  11/22/1992   CHOLECYSTECTOMY     COLONOSCOPY N/A 08/01/2013   Procedure: COLONOSCOPY;  Surgeon: Claudis RAYMOND Rivet, MD;  Location: [facility];  Service: Endoscopy;  Laterality: N/A;  930   COLONOSCOPY N/A 08/03/2017   Procedure: COLONOSCOPY;  Surgeon: Rivet Claudis RAYMOND, MD;  Location: [facility];  Service: Endoscopy;  Laterality: N/A;  12:00   COLONOSCOPY WITH PROPOFOL  N/A 06/28/2023   Procedure: COLONOSCOPY WITH PROPOFOL ;  Surgeon: Eartha Angelia Sieving, MD;  [facility]: [facility];  Service: Gastroenterology;  Laterality: N/A;  12:30pm;asa 3   CYST EXCISION  Left breast   ESOPHAGOGASTRODUODENOSCOPY (EGD) WITH PROPOFOL  N/A 06/16/2021   Procedure: ESOPHAGOGASTRODUODENOSCOPY (EGD) WITH PROPOFOL ;  Surgeon: Eartha Angelia Sieving, MD;  [facility];  Service: Gastroenterology;  Laterality: N/A;  12:50   ESOPHAGOGASTRODUODENOSCOPY (EGD) WITH PROPOFOL  N/A 06/28/2023   Procedure: ESOPHAGOGASTRODUODENOSCOPY (EGD) WITH PROPOFOL ;  Surgeon: Eartha Angelia Sieving, MD;  [facility]: [facility];  Service: Gastroenterology;  Laterality: N/A;  12:30pm;asa 3   EXCISIONAL HEMORRHOIDECTOMY     EYE SURGERY  11/22/1965   EYE SURGERY     bilateral cataract removal with lens implants   EYE SURGERY     growth removed from right eye lid   EYE SURGERY     bilateral eye surgery age 59 tto straighten eyes'   FEMUR IM NAIL Left 06/08/2024   Procedure: INSERTION, INTRAMEDULLARY ROD, FEMUR;  Surgeon: Celena Sharper, MD;  Location: [facility] OR;   Service: Orthopedics;  Laterality: Left;   heel (other)  11/23/2003   HEEL SPUR SURGERY     Right heel - growth removal and rebuilding of heel   HEMORRHOID SURGERY     HERNIA REPAIR     KNEE ARTHROSCOPY Bilateral    LUMBAR LAMINECTOMY/DECOMPRESSION MICRODISCECTOMY Right 08/23/2014   Procedure: LUMBAR LAMINECTOMY/DECOMPRESSION MICRODISCECTOMY 1 LEVEL;  Surgeon: Victory DELENA Gunnels, MD;  Location: [facility];  Service: Neurosurgery;  Laterality: Right;  LUMBAR LAMINECTOMY/DECOMPRESSION MICRODISCECTOMY 1 LEVEL LUMBAR 2-3   LUMBAR PERCUTANEOUS PEDICLE SCREW 2 LEVEL N/A 06/08/2024   Procedure: THORACIC ELEVEN-LUMBAR ONE PERCUTANEOUS PEDICLE SCREW;  Surgeon: Debby Dorn MATSU, MD;  Location: [facility] OR;  Service: Neurosurgery;  Laterality: N/A;  T11-L1 Percutaneous Fusion   ORIF FEMUR FRACTURE Left 06/08/2024   Procedure: OPEN REDUCTION INTERNAL FIXATION FEMORAL SHAFT FRACTURE;  Surgeon: Celena Sharper, MD;  Location: [facility] OR;  Service: Orthopedics;  Laterality: Left;   ovaries removed  11/22/2001   PARTIAL HYSTERECTOMY  11/23/1979   SHOULDER ARTHROSCOPY WITH DISTAL CLAVICLE RESECTION Left 08/06/2022   Procedure: SHOULDER ARTHROSCOPY WITH DISTAL CLAVICLE RESECTION;  Surgeon: Sharl Selinda Dover, MD;  [facility] OR;  Service: Orthopedics;  Laterality: Left;   SHOULDER ARTHROSCOPY WITH ROTATOR CUFF REPAIR Left 08/06/2022   Procedure: SHOULDER ARTHROSCOPY WITH ROTATOR CUFF REPAIR;  Surgeon: Sharl Selinda Dover, MD;  Location: [facility] OR;  Service: Orthopedics;  Laterality: Left;   SHOULDER ARTHROSCOPY WITH SUBACROMIAL DECOMPRESSION Left 08/06/2022   Procedure: SHOULDER ARTHROSCOPY WITH SUBACROMIAL DECOMPRESSIONWITH EXTENSIVE DEBRIDEMENT;  Surgeon: Sharl Selinda Dover, MD;  Location: [facility] OR;  Service: Orthopedics;  Laterality: Left;   SPHINCTEROTOMY     spinahatomy  11/22/1990   TOTAL KNEE ARTHROPLASTY Right 11/05/2020   Procedure: RIGHT TOTAL KNEE ARTHROPLASTY;  Surgeon: Barbarann Oneil BROCKS, MD;  Location: [facility] ORS;   Service: Orthopedics;  Laterality: Right;   TUBAL LIGATION  11/22/1973   TUBAL LIGATION     Social History:  reports that she has never smoked. She has never been exposed to tobacco smoke. She has never used smokeless tobacco. She reports that she does not drink alcohol  and does not use drugs.  Family / Support Systems Marital Status: Married Patient Roles: Spouse, Parent, Other (Comment) (grandmother) Spouse/Significant Other: Christopher [phone] Children: Derrick-son [phone] Other Supports: Other family members Anticipated Caregiver: Husband Ability/Limitations of Caregiver: NA Caregiver Availability: 24/7 Family Dynamics: Close knit with her family and extended family. She has friends who are supportive also  Social History Preferred language: English Religion: Holiness Cultural Background: NA Education: HS Health Literacy - How often do you need to have someone help you when you  read instructions, pamphlets, or other written material from your doctor or pharmacy?: Never Writes: Yes Employment Status: Disabled Marine scientist Issues: NA Guardian/Conservator: None-according to MD pt is capable of making her own decisions while here   Abuse/Neglect Abuse/Neglect Assessment Can Be Completed: Yes Physical Abuse: Denies Verbal Abuse: Denies Sexual Abuse: Denies Exploitation of patient/patient's resources: Denies Self-Neglect: Denies  Patient response to: Social Isolation - How often do you feel lonely or isolated from those around you?: Never  Emotional Status Pt's affect, behavior and adjustment status: Pt is motivated to do well and recover she had a fall last year and fractured her bones and did well with husband's help. She has brittle bones and she trieds to be careful when up moving. Recent Psychosocial Issues: other heatlh issues Psychiatric History: No history seems to be coping appropriately and positive regarding being here on rehab Substance Abuse History:  NA  Patient / Family Perceptions, Expectations & Goals Pt/Family understanding of illness & functional limitations: Pt can explain her fall and fractures as a result she does talk with the MD's involved and feels understands her treatment plan moving forward. Premorbid pt/family roles/activities: wife, mom, grandmother, retiree, neighbor, etc Anticipated changes in roles/activities/participation: resume Pt/family expectations/goals: Pt states:  I hope to do well and glad to be here last time I went to a nursing home and it was not a good experience.  Community [employer] Agencies: None Premorbid Home Care/DME Agencies: Other (Comment) (wc, hemi walker, rw) Transportation available at discharge: husband Is the patient able to respond to transportation needs?: Yes In the past 12 months, has lack of transportation kept you from medical appointments or from getting medications?: No In the past 12 months, has lack of transportation kept you from meetings, work, or from getting things needed for daily living?: No  Discharge Planning Living Arrangements: Spouse/significant other, Other relatives Support Systems: Spouse/significant other, Children, Other relatives, Friends/neighbors Type of Residence: Private residence Insurance Resources: Media planner (specify) Child psychotherapist) Financial Resources: Social Security, Family Support Financial Screen Referred: No Living Expenses: Own Money Management: Patient, Spouse Does the patient have any problems obtaining your medications?: No Home Management: both Patient/Family Preliminary Plans: Return home with husband who is able to assist and has in the past. He is in good health and their children and grandchildren are close by. Aware being evaluated today and goals being set for stay here Care Coordinator Anticipated Follow Up Needs: HH/OP  Clinical Impression Pleasant female who is motivated to do well here and recover from her  injuries from her fall. Her husband and children are supportive and involved. Will work on discharge need and await team's evaluations  Yahira Timberman, Meena Barrantes 06/19/2024, 9:03 AM

## 2024-06-19 NOTE — Progress Notes (Signed)
 PROGRESS NOTE   Subjective/Complaints:  Pt reports before chance fx, had 10-12 incontinent Bms per day- down to 2x/day- still loose and intermittently incontinent.   Also has long hx of bladder incontinence, but not feels like she's retaining urine- has to pee again 5 minutes later after voids the first time.    Was on bedpan when I came in- getting cleaned up.   Wants IV out of allowed.   Pain is pretty bad- but has scheduled Norco in AM and lunch as well as Norco prn q4 hours   ROS: Per HPI  Pt denies SOB, abd pain, CP, N/V/C/D, and vision changes    Objective:   No results found. Recent Labs    06/18/24 1529  WBC 3.9*  HGB 9.7*  HCT 29.4*  PLT 203   Recent Labs    06/19/24 0456  NA 138  K 4.1  CL 113*  CO2 18*  GLUCOSE 86  BUN 16  CREATININE 0.76  CALCIUM  7.2*    Intake/Output Summary (Last 24 hours) at 06/19/2024 1004 Last data filed at 06/18/2024 2250 Gross per 24 hour  Intake 240 ml  Output 527 ml  Net -287 ml        Physical Exam: Vital Signs Blood pressure 108/67, pulse 66, temperature 98.2 F (36.8 C), resp. rate 16, height 5' (1.524 m), weight 59.4 kg, SpO2 100%.    General: awake, alert, appropriate, sitting up on bedpan initially- then getting cleaned up; nurse and NT joined us ; NAD HENT: conjugate gaze; oropharynx a little dry- licking lips CV: regular rate and rhythm; no JVD Pulmonary: CTA B/L; no W/R/R- good air movement GI: soft, NT, ND, (+)BS- slightly hyperactive Psychiatric: appropriate- slightly anxious- and anticipating pain with rolling.  Neurological: Ox3   Comments: LLE limited by pain--foam dressing on small incisions left lateral thigh as well as linear area of ecchymosis.  - down entire L side lateral aspect of L upper leg  Neurologic: Cranial nerves II through XII intact, motor strength is 5/5 in bilateral deltoid, bicep, tricep, grip, 4-/5 RIght and 3-/5 Left hip  flexor, knee extensors, ankle dorsiflexor and plantar flexor Sensory exam normal sensation to light touch  in bilateral upper and lower extremities Cerebellar exam normal finger to nose to finger  in bilateral upper extremities  Assessment/Plan: 1. Functional deficits which require 3+ hours per day of interdisciplinary therapy in a comprehensive inpatient rehab setting. Physiatrist is providing close team supervision and 24 hour management of active medical problems listed below. Physiatrist and rehab team continue to assess barriers to discharge/monitor patient progress toward functional and medical goals  Care Tool:  Bathing              Bathing assist       Upper Body Dressing/Undressing Upper body dressing        Upper body assist      Lower Body Dressing/Undressing Lower body dressing            Lower body assist       Toileting Toileting    Toileting assist Assist for toileting: Maximal Assistance - Patient 25 - 49%     Transfers Chair/bed transfer  Transfers assist     Chair/bed transfer assist level: Total Assistance - Patient < 25%     Locomotion Ambulation   Ambulation assist              Walk 10 feet activity   Assist           Walk 50 feet activity   Assist           Walk 150 feet activity   Assist           Walk 10 feet on uneven surface  activity   Assist           Wheelchair     Assist               Wheelchair 50 feet with 2 turns activity    Assist            Wheelchair 150 feet activity     Assist          Blood pressure 108/67, pulse 66, temperature 98.2 F (36.8 C), resp. rate 16, height 5' (1.524 m), weight 59.4 kg, SpO2 100%.    Medical Problem List and Plan: 1. Functional deficits secondary to Polytrauma             -patient may not shower? Will check with OT- might be able to wrap over the brace and still shower             -ELOS/Goals: 7-10d  First  day of evaluations- con't CIR PT and OT 2.  Antithrombotics: -DVT/anticoagulation:  Pharmaceutical: Lovenox              -antiplatelet therapy: N/A 3. Pain Management:  Hydrocodone  10 mg prn severe pain and 5 mg prn moderate pain. Premedicate prior to therapy--has a lot of anxiety regarding activity tolerance/pain.              --continues to have muscle spasms worse when in chair. Continue robaxin  750 mg tid. May need baclofen  additionally. 7/29- pt received scheduled pain meds this AM- but not prns- will d/w nursing  4. Mood/Behavior/Sleep: LCSW to follow for evaluation and support.              -antipsychotic agents: N/A. Team to provide ego support.             --melatonin prn for insomnia.   7/29- will add Buspar  5 mg TID for anxiety 5. Neuropsych/cognition: This patient is capable of making decisions on her own behalf. 6. Skin/Wound Care: Routine pressure relief measures.  7. Fluids/Electrolytes/Nutrition: Monitor I/O. Check CMET in am 8. T12 Chance Fx s/p fixation: Wear TLSO when out of bed 9. Displaced femur fx s/p ORIF: TTWB with follow up X rays in 2 weeks 10. H/o Multifactorial Neuropathy: Continue gabapentin  BID.  Has chronic L2-3 stenosis which has worsened over time.   11.  H/o morbid Obesity s/p duodenal switch surgery: NO longer on meds for HTN, DM  and does not have sleep apnea. Pt reports 150lb weight loss  --Reports has not been able to afford vitamins--discussed generic --options. --Resume copper . Change Vit C to B complex w/C.  12. H/o dumping syndrome:  Has had bowel and bladder incontinence since DSS. 7/29- questionable urinary retention now and feeling like cannot empty bowels as well- could have neurogenic bowel and bladder- will order bladder scans  q6 hours scheduled 13. H/o spinal stenosis: Multiple surgery with RLE weakness 14. Thrombocytopenia: Pancytopenia in the past felt to be due to nutritional/copper   deficiency.              --Platelets recovering from 85 to  118  7/29- plts up to 203k 15. Acute blood loss anemia: Recheck hgb in am  7/29- Hb stable at 9.5- but MCV 102- increasing  since admission- started on Vit B complex with Vit C- will con't to monitor trend since might need B12 injections.  16. Neutropenia: WBC- 6.5-->3.9.  Recheck in am.   7/29- is followed by Hematology for Neutropenia- Latest appt was ~ 1 month ago- WBC stable at 3.9 17. Vitamin D  deficiency- 19.97: Change cholecalciferol  to ergocalciferol  added.   18. COPD: Hx of cough/second hand smoke exposure. Continue Breztri .  19. Hypotension: Will monitor weights daily. TEDs for edema control.  --D/c lasix . TEDs for edema control LLE. May need binder if orthostatic.  7/29- will see if needs to add midodrine.  20. Hypothyroid: Continue supplement.   21. Hypocalcemia  7/29- Ca is 7.2- but albumin  is 2.5- will check Ionized Calcium  tomorrow to make sure if needs to replete, esp with Duodenal switch surgery.     I spent a total of 51   minutes on total care today- >50% coordination of care- due to  D/w PA's at length about pt's medical issues- labs; and reviewed labs, vitals and Bowel/bladder- uses briefs at home as well.   LOS: 1 days A FACE TO FACE EVALUATION WAS PERFORMED  Elazar Argabright 06/19/2024, 10:04 AM

## 2024-06-19 NOTE — Plan of Care (Signed)
  Problem: RH Balance Goal: LTG Patient will maintain dynamic standing with ADLs (OT) Description: LTG:  Patient will maintain dynamic standing balance with assist during activities of daily living (OT)  Flowsheets (Taken 06/19/2024 1217) LTG: Pt will maintain dynamic standing balance during ADLs with: Independent with assistive device   Problem: RH Eating Goal: LTG Patient will perform eating w/assist, cues/equip (OT) Description: LTG: Patient will perform eating with assist, with/without cues using equipment (OT) Flowsheets (Taken 06/19/2024 1217) LTG: Pt will perform eating with assistance level of: Independent with assistive device    Problem: RH Grooming Goal: LTG Patient will perform grooming w/assist,cues/equip (OT) Description: LTG: Patient will perform grooming with assist, with/without cues using equipment (OT) Flowsheets (Taken 06/19/2024 1217) LTG: Pt will perform grooming with assistance level of: Independent with assistive device    Problem: RH Bathing Goal: LTG Patient will bathe all body parts with assist levels (OT) Description: LTG: Patient will bathe all body parts with assist levels (OT) Flowsheets (Taken 06/19/2024 1217) LTG: Pt will perform bathing with assistance level/cueing: Independent with assistive device    Problem: RH Dressing Goal: LTG Patient will perform upper body dressing (OT) Description: LTG Patient will perform upper body dressing with assist, with/without cues (OT). Flowsheets (Taken 06/19/2024 1217) LTG: Pt will perform upper body dressing with assistance level of: Independent with assistive device Goal: LTG Patient will perform lower body dressing w/assist (OT) Description: LTG: Patient will perform lower body dressing with assist, with/without cues in positioning using equipment (OT) Flowsheets (Taken 06/19/2024 1217) LTG: Pt will perform lower body dressing with assistance level of: Independent with assistive device   Problem: RH Toileting Goal:  LTG Patient will perform toileting task (3/3 steps) with assistance level (OT) Description: LTG: Patient will perform toileting task (3/3 steps) with assistance level (OT)  Flowsheets (Taken 06/19/2024 1217) LTG: Pt will perform toileting task (3/3 steps) with assistance level: Independent with assistive device   Problem: RH Toilet Transfers Goal: LTG Patient will perform toilet transfers w/assist (OT) Description: LTG: Patient will perform toilet transfers with assist, with/without cues using equipment (OT) Flowsheets (Taken 06/19/2024 1217) LTG: Pt will perform toilet transfers with assistance level of: Independent with assistive device   Problem: RH Tub/Shower Transfers Goal: LTG Patient will perform tub/shower transfers w/assist (OT) Description: LTG: Patient will perform tub/shower transfers with assist, with/without cues using equipment (OT) Flowsheets (Taken 06/19/2024 1217) LTG: Pt will perform tub/shower stall transfers with assistance level of: Independent with assistive device

## 2024-06-20 LAB — GLUCOSE, CAPILLARY
Glucose-Capillary: 82 mg/dL (ref 70–99)
Glucose-Capillary: 83 mg/dL (ref 70–99)
Glucose-Capillary: 84 mg/dL (ref 70–99)
Glucose-Capillary: 115 mg/dL — ABNORMAL HIGH (ref 70–99)

## 2024-06-20 MED ORDER — MELATONIN 5 MG PO TABS
5.0000 mg | ORAL_TABLET | Freq: Every day | ORAL | Status: DC
  Administered 2024-06-20 – 2024-06-28 (×9): 5 mg via ORAL
  Filled 2024-06-20 (×9): qty 1

## 2024-06-20 MED ORDER — SENNA 8.6 MG PO TABS
1.0000 | ORAL_TABLET | Freq: Every day | ORAL | Status: DC
  Administered 2024-06-20 – 2024-06-29 (×10): 8.6 mg via ORAL
  Filled 2024-06-20 (×10): qty 1

## 2024-06-20 NOTE — Progress Notes (Signed)
 Physical Therapy Session Note  Patient Details  Name: Kimberly [name] MRN: [id number] Date of Birth: [birth year]  Today's Date: 06/20/2024 PT Individual Time: 1108-1205 PT Individual Time Calculation (min): 57 min   Short Term Goals: Week 1:  PT Short Term Goal 1 (Week 1): = LTG  Skilled Therapeutic Interventions/Progress Updates: Pt presented in bed agreeable to therapy. Pt states continues to have spasms but appear to be calming down. Discussed goal of pt to end session sitting up in w/c. Participated in knee to chest with small yellow physioball for gentle stretching and ROM. Pt able to complete without spasm. Pt then worked on bed positioning to self efficacy to allow for improved tolerance for positioning in bed. Pt required significantly increased time but was able to raise HOB to 45 degrees and manage lower portion of bed without significant increase in pain. Discussed being able to reposition bed as needed for meals, etc. Pt lowered HOB to 32 degrees then completed supine to sit with CGA and increased time. Pt donned TLSO with minA to maintain spinal precautions. Pt completed stand pivot transfer to w/c with light minA for RW management and vc's to maintain TTWBas appeared to place increased pressure when completing transfer. Once transfer completed PTA obtained youth RW to allow for improved use of BUE and adjusted leg rest for improved fit. Pt left in w/c at end of session with RN present, call bell within reach and needs met.      Therapy Documentation Precautions:  Precautions Precautions: Back Precaution Booklet Issued: Yes (comment) Recall of Precautions/Restrictions: Intact Precaution/Restrictions Comments: TTWB LLE, TSLO OOB, NO BLT; pt able to recall Required Braces or Orthoses: Spinal Brace Spinal Brace: Thoracolumbosacral orthotic, Applied in sitting position Restrictions Weight Bearing Restrictions Per Provider Order: Yes LLE Weight Bearing Per Provider Order:  Touchdown weight bearing Other Position/Activity Restrictions: TTWB LLE General:   Vital Signs: Therapy Vitals Temp: 97.6 F (36.4 C) Temp Source: Oral Pulse Rate: (!) 58 Resp: 18 BP: 117/63 Patient Position (if appropriate): Sitting Oxygen  Therapy SpO2: 100 % O2 Device: Room Air Pain: Pain Assessment Pain Scale: 0-10 Pain Score: 8  Pain Location: Back Pain Intervention(s): Medication (See eMAR)   Therapy/Group: Individual Therapy  Marchella Hibbard 06/20/2024, 4:02 PM

## 2024-06-20 NOTE — Progress Notes (Signed)
 Occupational Therapy Session Note  Patient Details  Name: Kimberly [name] MRN: [id number] Date of Birth: 17-Apr-1956  Today's Date: 06/20/2024 OT Individual Time: [phone] OT Individual Time Calculation (min): 52 min    Short Term Goals: Week 1:  OT Short Term Goal 1 (Week 1): Pt will complete toileting at CGA with LRAD OT Short Term Goal 2 (Week 1): Pt will complete LB dressing at CGA with AE as necessary OT Short Term Goal 3 (Week 1): Pt will complete transfera at [facility] with LRAD  Skilled Therapeutic Interventions/Progress Updates:      Therapy Documentation Precautions:  Precautions Precautions: Back Precaution Booklet Issued: Yes (comment) Recall of Precautions/Restrictions: Intact Precaution/Restrictions Comments: TTWB LLE, TSLO OOB, NO BLT; pt able to recall Required Braces or Orthoses: Spinal Brace Spinal Brace: Thoracolumbosacral orthotic, Applied in sitting position Restrictions Weight Bearing Restrictions Per Provider Order: Yes LLE Weight Bearing Per Provider Order: Touchdown weight bearing Other Position/Activity Restrictions: TTWB LLE General: Pt supine in bed upon OT arrival, agreeable to OT session.  Pain:  5/10 pain reported in back d/t spasms, activity, intermittent rest breaks, distractions provided for pain management, pt reports tolerable to proceed.   ADL: OT providing skilled intervention on ADL retraining in order to increase independence with tasks and increase activity tolerance. Pt completed the following tasks at the current level of assist: Bed mobility: SBA using log roll technique and bed rails UB dressing: SBA seated EOB only to don/doff shirt  LB dressing: Min A bed level rolling side to side, pt able to manage pants over waist with rolling, OT assisting with managing over LLE Bathing: OT and pt discussing bathing techniques for D/C planning d/t inability to remove brace for bathing. OT recommending sponge bathing at bed level for UB and in chair  for LB with long handled sponge once home for increased independence, pt verbally understanding of information. OT educating pt on no rinse pre soaped wipes from [employer] available for purchase    Pt supine in bed with bed alarm activated, 2 bed rails up, call light within reach and 4Ps assessed.   Therapy/Group: Individual Therapy  Camie Hoe, OTD, OTR/L 06/20/2024, 11:29 AM

## 2024-06-20 NOTE — Progress Notes (Signed)
 Physical Therapy Session Note  Patient Details  Name: Kimberly [name] MRN: [id number] Date of Birth: [birth year]  Today's Date: 06/20/2024 PT Individual Time: 0945-1030 PT Individual Time Calculation (min): 45 min   Short Term Goals: Week 1:  PT Short Term Goal 1 (Week 1): = LTG  Skilled Therapeutic Interventions/Progress Updates:    Pt in bed when PT arrived, pt c/o back spasms this AM and not sure if she had her pain meds. LBP 8/10.  PT notified Nsg.  Per MD notes, pt is to have written medication times for pt to keep track and know when to ask for them. Pt agreeable for PT.  Nsg provided Norco during session.  Pt performed glute sets 4x10, heel slides x15, isometric adduction with towel x3 holds x20, hamstring isometric in H/L x3 holds x20.  Pt is moving involved extremity with improvement, ie without assist, more fluid.  Pt right sidelying with pillow between knees iso add, gentle pelvic oscillations for pain relief.  Pt returned to supine with supervision, pt comfortable.  All items within reach.   Therapy Documentation Precautions:  Precautions Precautions: Back Precaution Booklet Issued: Yes (comment) Recall of Precautions/Restrictions: Intact Precaution/Restrictions Comments: TTWB LLE, TSLO OOB, NO BLT; pt able to recall Required Braces or Orthoses: Spinal Brace Spinal Brace: Thoracolumbosacral orthotic, Applied in sitting position Restrictions Weight Bearing Restrictions Per Provider Order: Yes LLE Weight Bearing Per Provider Order: Touchdown weight bearing Other Position/Activity Restrictions: TTWB LLE    Pain: Pain Assessment Pain Scale: 0-10 Pain Score: 8/10 LB     Therapy/Group: Individual Therapy  [name] Fast 06/20/2024, 9:51 AM

## 2024-06-20 NOTE — Progress Notes (Signed)
 PROGRESS NOTE   Subjective/Complaints:  Pt reports that didn't get any of her meds yesterday- said was told by the nurse things were not ordered- including benadryl , Muscle relaxant, pain meds and vitamins- Went over meds list and MAR- appears pt has prns for many of meds- appeared that pt missed Norco prn yesterday from 1145 to 8 pm- there was a large break- explained the benadryl  is prn- has to ask for it- and Muscle relaxant is scheduled- maybe hurting more because did much more therapy than had been doing in acute And that she needs to keep on top of asking for pain meds- since she has macular degeneration, suggest asking nurse to write down times on her schedule so she can see it and know when to ask.   ROS: Per HPI   Pt denies SOB, abd pain, CP, N/V/C/D, and vision changes    Objective:   No results found. Recent Labs    06/18/24 1529  WBC 3.9*  HGB 9.7*  HCT 29.4*  PLT 203   Recent Labs    06/19/24 0456  NA 138  K 4.1  CL 113*  CO2 18*  GLUCOSE 86  BUN 16  CREATININE 0.76  CALCIUM  7.2*    Intake/Output Summary (Last 24 hours) at 06/20/2024 0902 Last data filed at 06/20/2024 0734 Gross per 24 hour  Intake 277 ml  Output 1350 ml  Net -1073 ml        Physical Exam: Vital Signs Blood pressure (!) 107/58, pulse 62, temperature 97.8 F (36.6 C), temperature source Oral, resp. rate 18, height 5' (1.524 m), weight 59.4 kg, SpO2 92%.     General: awake, alert, appropriate, sitting up slightly in bed; NAD HENT: conjugate gaze; oropharynx moist CV: regular rate; and rhythm no JVD Pulmonary: CTA B/L; no W/R/R- good air movement GI: soft, NT, ND, (+)BS- normoactive Psychiatric: appropriate- very interactive Neurological: Ox3    Comments: LLE limited by pain--foam dressing on small incisions left lateral thigh as well as linear area of ecchymosis.  - down entire L side lateral aspect of L upper leg   Neurologic: Cranial nerves II through XII intact, motor strength is 5/5 in bilateral deltoid, bicep, tricep, grip, 4-/5 RIght and 3-/5 Left hip flexor, knee extensors, ankle dorsiflexor and plantar flexor Sensory exam normal sensation to light touch  in bilateral upper and lower extremities Cerebellar exam normal finger to nose to finger  in bilateral upper extremities  Assessment/Plan: 1. Functional deficits which require 3+ hours per day of interdisciplinary therapy in a comprehensive inpatient rehab setting. Physiatrist is providing close team supervision and 24 hour management of active medical problems listed below. Physiatrist and rehab team continue to assess barriers to discharge/monitor patient progress toward functional and medical goals  Care Tool:  Bathing              Bathing assist Assist Level: Minimal Assistance - Patient > 75%     Upper Body Dressing/Undressing Upper body dressing        Upper body assist Assist Level: Contact Guard/Touching assist    Lower Body Dressing/Undressing Lower body dressing  Lower body assist Assist for lower body dressing: Minimal Assistance - Patient > 75%     Toileting Toileting Toileting Activity did not occur Press photographer and hygiene only): N/A (no void or bm)  Toileting assist Assist for toileting: Minimal Assistance - Patient > 75%     Transfers Chair/bed transfer  Transfers assist     Chair/bed transfer assist level: Minimal Assistance - Patient > 75%     Locomotion Ambulation   Ambulation assist      Assist level: Contact Guard/Touching assist Assistive device: Walker-rolling Max distance: 12'   Walk 10 feet activity   Assist     Assist level: Contact Guard/Touching assist Assistive device: Walker-rolling   Walk 50 feet activity   Assist Walk 50 feet with 2 turns activity did not occur: Safety/medical concerns (pain, weakness)         Walk 150 feet  activity   Assist Walk 150 feet activity did not occur: Safety/medical concerns (pain, weakness)         Walk 10 feet on uneven surface  activity   Assist Walk 10 feet on uneven surfaces activity did not occur: Safety/medical concerns (pain, weakness)         Wheelchair     Assist Is the patient using a wheelchair?: Yes Type of Wheelchair: Manual    Wheelchair assist level: Total Assistance - Patient < 25%      Wheelchair 50 feet with 2 turns activity    Assist    Wheelchair 50 feet with 2 turns activity did not occur: Safety/medical concerns (pain, weakness, TTWB LLE)       Wheelchair 150 feet activity     Assist  Wheelchair 150 feet activity did not occur: Safety/medical concerns (pain, weakness, TTWB LLE)       Blood pressure (!) 107/58, pulse 62, temperature 97.8 F (36.6 C), temperature source Oral, resp. rate 18, height 5' (1.524 m), weight 59.4 kg, SpO2 92%.    Medical Problem List and Plan: 1. Functional deficits secondary to Polytrauma             -patient may not shower? Will check with OT- might be able to wrap over the brace and still shower             -ELOS/Goals: 7-10d  D/c date 06/29/24  Con't CIR PT and OT 2.  Antithrombotics: -DVT/anticoagulation:  Pharmaceutical: Lovenox              -antiplatelet therapy: N/A 3. Pain Management:  Hydrocodone  10 mg prn severe pain and 5 mg prn moderate pain. Premedicate prior to therapy--has a lot of anxiety regarding activity tolerance/pain.              --continues to have muscle spasms worse when in chair. Continue robaxin  750 mg tid. May need baclofen  additionally. 7/29- pt received scheduled pain meds this AM- but not prns- will d/w nursing 7/30- changed Norco to say can give within 30 minutes of scheduled Norco  4. Mood/Behavior/Sleep: LCSW to follow for evaluation and support.              -antipsychotic agents: N/A. Team to provide ego support.             --melatonin prn for insomnia.    7/29- will add Buspar  5 mg TID for anxiety 5. Neuropsych/cognition: This patient is capable of making decisions on her own behalf. 6. Skin/Wound Care: Routine pressure relief measures.  7. Fluids/Electrolytes/Nutrition: Monitor I/O. Check CMET in am 8. T12  Chance Fx s/p fixation: Wear TLSO when out of bed 9. Displaced femur fx s/p ORIF: TTWB with follow up X rays in 2 weeks 10. H/o Multifactorial Neuropathy: Continue gabapentin  BID.  Has chronic L2-3 stenosis which has worsened over time.   11.  H/o morbid Obesity s/p duodenal switch surgery: NO longer on meds for HTN, DM  and does not have sleep apnea. Pt reports 150lb weight loss  --Reports has not been able to afford vitamins--discussed generic --options. --Resume copper . Change Vit C to B complex w/C.  7/30- pt was concerned wasn't getting vitamins- compared to acute and is.  12. H/o dumping syndrome:  Has had bowel and bladder incontinence since DSS. 7/29- questionable urinary retention now and feeling like cannot empty bowels as well- could have neurogenic bowel and bladder- will order bladder scans  q6 hours scheduled 7/30- no increased PVRs right now so far and no BM yesterday- will restart Senna- not colace, since causes looser stools,  13. H/o spinal stenosis: Multiple surgery with RLE weakness 14. Thrombocytopenia: Pancytopenia in the past felt to be due to nutritional/copper  deficiency.              --Platelets recovering from 85 to 118  7/29- plts up to 203k 15. Acute blood loss anemia: Recheck hgb in am  7/29- Hb stable at 9.5- but MCV 102- increasing  since admission- started on Vit B complex with Vit C- will con't to monitor trend since might need B12 injections.  16. Neutropenia: WBC- 6.5-->3.9.  Recheck in am.   7/29- is followed by Hematology for Neutropenia- Latest appt was ~ 1 month ago- WBC stable at 3.9 17. Vitamin D  deficiency- 19.97: Change cholecalciferol  to ergocalciferol  added.   18. COPD: Hx of cough/second hand  smoke exposure. Continue Breztri .  19. Hypotension: Will monitor weights daily. TEDs for edema control.  --D/c lasix . TEDs for edema control LLE. May need binder if orthostatic.  7/29- will see if needs to add midodrine.  20. Hypothyroid: Continue supplement.   21. Hypocalcemia  7/29- Ca is 7.2- but albumin  is 2.5- will check Ionized Calcium  tomorrow to make sure if needs to replete, esp with Duodenal switch surgery.   7/30- is pending.    I spent a total of  57   minutes on total care today- >50% coordination of care- due to  Spent 40 minutes in room answering pt's questions, going over meds here vs acute and educating pt- also spoke with nursing.    LOS: 2 days A FACE TO FACE EVALUATION WAS PERFORMED  Tammatha Cobb 06/20/2024, 9:02 AM

## 2024-06-20 NOTE — Progress Notes (Signed)
 Occupational Therapy Session Note  Patient Details  Name: Kimberly [name] MRN: [id number] Date of Birth: Apr 23, 1956  Today's Date: 06/20/2024 OT Individual Time: 8580-8492 OT Individual Time Calculation (min): 48 min    Short Term Goals: Week 1:  OT Short Term Goal 1 (Week 1): Pt will complete toileting at [facility] with LRAD OT Short Term Goal 2 (Week 1): Pt will complete LB dressing at CGA with AE as necessary OT Short Term Goal 3 (Week 1): Pt will complete transfera at [facility] with LRAD  Skilled Therapeutic Interventions/Progress Updates:    Pt received supine with 8/10 pain in her back but reporting this is much improved and she is premedicated, agreeable to OT session. She required min cueing for log rolling technique to come to EOB. She was able to do this with just (S). She donned her TLSO with min cueing and (S). She stood with the RW with CGA. She completed 100 ft of functional mobility to the therapy gym with the RW with good adherence of TDWB precautions. She required CGA initially and progressed to close (S). Pt required use of rest breaks throughout session for recovery, as well as to support safety and prevent overexertion. During breaks, OT monitored recovery time to assess endurance and response to exertion. Pt completed standing level LUE hip flexion activity with B UE support using a 5 in step 2x15 repetitions. Activity performed to challenge dynamic standing balance and functional activity tolerance to simulate household threshold management and reduce fall risk. Pt required CGA overall. She returned to her room, 100 ft with the RW at (S) level and was left supine with all needs met, bed alarm set.    Therapy Documentation Precautions:  Precautions Precautions: Back Precaution Booklet Issued: Yes (comment) Recall of Precautions/Restrictions: Intact Precaution/Restrictions Comments: TTWB LLE, TSLO OOB, NO BLT; pt able to recall Required Braces or Orthoses: Spinal Brace Spinal  Brace: Thoracolumbosacral orthotic, Applied in sitting position Restrictions Weight Bearing Restrictions Per Provider Order: Yes LLE Weight Bearing Per Provider Order: Touchdown weight bearing Other Position/Activity Restrictions: TTWB LLE  Therapy/Group: Individual Therapy  [name] 06/20/2024, 2:23 PM

## 2024-06-20 NOTE — Plan of Care (Signed)
  Problem: Consults Goal: RH GENERAL PATIENT EDUCATION Description: See Patient Education module for education specifics. Outcome: Progressing   Problem: RH BOWEL ELIMINATION Goal: RH STG MANAGE BOWEL WITH ASSISTANCE Description: STG Manage Bowel with supervision Assistance. Outcome: Progressing   Problem: RH BLADDER ELIMINATION Goal: RH STG MANAGE BLADDER WITH ASSISTANCE Description: STG Manage Bladder With supervision Assistance Outcome: Progressing   Problem: RH SKIN INTEGRITY Goal: RH STG SKIN FREE OF INFECTION/BREAKDOWN Description: Manage skin free of infection/breakdown with supervision assistance Outcome: Progressing   Problem: RH SAFETY Goal: RH STG ADHERE TO SAFETY PRECAUTIONS W/ASSISTANCE/DEVICE Description: STG Adhere to Safety Precautions With supervision  Assistance/Device. Outcome: Progressing   Problem: RH PAIN MANAGEMENT Goal: RH STG PAIN MANAGED AT OR BELOW PT'S PAIN GOAL Description: <4 w/ prns Outcome: Progressing   Problem: RH KNOWLEDGE DEFICIT GENERAL Goal: RH STG INCREASE KNOWLEDGE OF SELF CARE AFTER HOSPITALIZATION Description: Manage increase  knowledge of self care after hospitalization with supervision assistance from spouse using educational materials provided Outcome: Progressing

## 2024-06-20 NOTE — Plan of Care (Signed)
  Problem: Consults Goal: RH GENERAL PATIENT EDUCATION Description: See Patient Education module for education specifics. Outcome: Progressing   Problem: RH BOWEL ELIMINATION Goal: RH STG MANAGE BOWEL WITH ASSISTANCE Description: STG Manage Bowel with supervision Assistance. Outcome: Progressing   Problem: RH BLADDER ELIMINATION Goal: RH STG MANAGE BLADDER WITH ASSISTANCE Description: STG Manage Bladder With supervision Assistance Outcome: Progressing   Problem: RH SAFETY Goal: RH STG ADHERE TO SAFETY PRECAUTIONS W/ASSISTANCE/DEVICE Description: STG Adhere to Safety Precautions With supervision  Assistance/Device. Outcome: Progressing   Problem: RH PAIN MANAGEMENT Goal: RH STG PAIN MANAGED AT OR BELOW PT'S PAIN GOAL Description: <4 w/ prns Outcome: Progressing

## 2024-06-21 LAB — CBC
HCT: 28.1 % — ABNORMAL LOW (ref 36.0–46.0)
Hemoglobin: 9.4 g/dL — ABNORMAL LOW (ref 12.0–15.0)
MCH: 33.9 pg (ref 26.0–34.0)
MCHC: 33.5 g/dL (ref 30.0–36.0)
MCV: 101.4 fL — ABNORMAL HIGH (ref 80.0–100.0)
Platelets: 223 K/uL (ref 150–400)
RBC: 2.77 MIL/uL — ABNORMAL LOW (ref 3.87–5.11)
RDW: 16.4 % — ABNORMAL HIGH (ref 11.5–15.5)
WBC: 3.4 K/uL — ABNORMAL LOW (ref 4.0–10.5)
nRBC: 0 % (ref 0.0–0.2)

## 2024-06-21 LAB — BASIC METABOLIC PANEL WITH GFR
Anion gap: 8 (ref 5–15)
BUN: 26 mg/dL — ABNORMAL HIGH (ref 8–23)
CO2: 18 mmol/L — ABNORMAL LOW (ref 22–32)
Calcium: 7.8 mg/dL — ABNORMAL LOW (ref 8.9–10.3)
Chloride: 110 mmol/L (ref 98–111)
Creatinine, Ser: 0.86 mg/dL (ref 0.44–1.00)
GFR, Estimated: >60 mL/min (ref >60)
Glucose, Bld: 83 mg/dL (ref 70–99)
Potassium: 4.6 mmol/L (ref 3.5–5.1)
Sodium: 136 mmol/L (ref 135–145)

## 2024-06-21 LAB — GLUCOSE, CAPILLARY
Glucose-Capillary: 87 mg/dL (ref 70–99)
Glucose-Capillary: 78 mg/dL (ref 70–99)
Glucose-Capillary: 103 mg/dL — ABNORMAL HIGH (ref 70–99)
Glucose-Capillary: 116 mg/dL — ABNORMAL HIGH (ref 70–99)

## 2024-06-21 LAB — CALCIUM, IONIZED: Calcium, Ionized, Serum: 4.7 mg/dL (ref 4.5–5.6)

## 2024-06-21 MED ORDER — METHOCARBAMOL 500 MG PO TABS
1000.0000 mg | ORAL_TABLET | Freq: Four times a day (QID) | ORAL | Status: DC
  Administered 2024-06-21 – 2024-06-27 (×24): 1000 mg via ORAL
  Filled 2024-06-21 (×24): qty 2

## 2024-06-21 NOTE — Progress Notes (Signed)
 Physical Therapy Session Note  Patient Details  Name: Kimberly [name] MRN: [id number] Date of Birth: Sep 05, 1956  Today's Date: 06/21/2024 PT Individual Time: 0900-1000 PT Individual Time Calculation (min): 60 min   Short Term Goals: Week 1:  PT Short Term Goal 1 (Week 1): = LTG  Skilled Therapeutic Interventions/Progress Updates:    Pt up in w/c with TLSO donned.  Pt reports LBP 8/10.  Pt states she walked to the gym yesterday and knows she has to move to improve.   Sit to stand transfers from W/C>RW CGA.  Gait tx with RW CGA room to day gym with x1 w/c rest.  Seated knee flex/ext, abd/addon on sliding chair with assist 2x20, standing RW LLE forward leg kicks 2x10, hip abd 2x10, hip flexion x10.  Pt ambulated back to room with RW CGA VC for TTWB reminders as needed. Standing transfer to EOB with supervision, pt able to doff TLSO with min assist, sit->supine supine supervision. Pt in bed with all items within reach.   Therapy Documentation Precautions:  Precautions Precautions: Back Precaution Booklet Issued: Yes (comment) Recall of Precautions/Restrictions: Intact Precaution/Restrictions Comments: TTWB LLE, TSLO OOB, NO BLT; pt able to recall Required Braces or Orthoses: Spinal Brace Spinal Brace: Thoracolumbosacral orthotic, Applied in sitting position Restrictions Weight Bearing Restrictions Per Provider Order: Yes LLE Weight Bearing Per Provider Order: Touchdown weight bearing Other Position/Activity Restrictions: TTWB LLE   Pain: Pain Assessment Pain Scale: 0-10 Pain Score: 7  Pain Location: Back Pain Intervention(s): Medication (See eMAR) M   Therapy/Group: Individual Therapy  [name] Fast 06/21/2024, 9:09 AM

## 2024-06-21 NOTE — Progress Notes (Signed)
 Occupational Therapy Session Note  Patient Details  Name: Kimberly [name] MRN: [id number] Date of Birth: 11-13-1956  Today's Date: 06/21/2024 OT Individual Time: 0735-       Short Term Goals: Week 1:  OT Short Term Goal 1 (Week 1): Pt will complete toileting at CGA with LRAD OT Short Term Goal 2 (Week 1): Pt will complete LB dressing at CGA with AE as necessary OT Short Term Goal 3 (Week 1): Pt will complete transfera at [facility] with LRAD  Skilled Therapeutic Interventions/Progress Updates:   Patient received supported sitting in bed finishing breakast and taking am medications.  Patient agreeable to OT session and able to recall method from yesterday to follow precautions.  Removed nightshirt and washed upper body in bed, sat at edge of bed to don TLSO.  Introduced long handled bath sponge, reacher, and sock aide to increase independence with bathing and dressing.  Patient indicated need to void and walked to bathroom with RW and CGA.  Continent void - recorded in flowsheets.  Patient needing min assist at transition to bathroom - small ascent.  Patient needing CGA for toileting, and transfer.  Patient familiar with AE from prior surgeries.  Patient chose to stand to brush teeth.  Ice applied to left thigh for pain management.  Left up in chair with personal items and call bell in reach  Therapy Documentation Precautions:  Precautions Precautions: Back Precaution Booklet Issued: Yes (comment) Recall of Precautions/Restrictions: Intact Precaution/Restrictions Comments: TTWB LLE, TSLO OOB, NO BLT; pt able to recall Required Braces or Orthoses: Spinal Brace Spinal Brace: Thoracolumbosacral orthotic, Applied in sitting position Restrictions Weight Bearing Restrictions Per Provider Order: Yes LLE Weight Bearing Per Provider Order: Touchdown weight bearing Other Position/Activity Restrictions: TTWB LLE   Pain: Pain Assessment Pain Scale: 0-10 Pain Score: 8  Pain Location: Back and  leg Pain Intervention(s): Medication (See eMAR) Repositioned out  of bed     Therapy/Group: Individual Therapy  Stanlee Roehrig M 06/21/2024, 8:08 AM

## 2024-06-21 NOTE — IPOC Note (Signed)
 Overall Plan of Care [facility]) Patient Details Name: Kimberly [name] MRN: [id number] DOB: 1955/12/28  Admitting Diagnosis: Thoracic spine fracture [facility])  Hospital Problems: Principal Problem:   Thoracic spine fracture [facility]) Active Problems:   Femur fracture, left (HCC)     Functional Problem List: Nursing Bladder, Bowel, Edema, Endurance, Medication Management, Motor, Pain, Safety, Skin Integrity  PT Balance, Edema, Endurance, Motor, Pain, Safety, Skin Integrity  OT Balance, Endurance, Motor, Pain, Safety, Sensory, Skin Integrity  SLP    TR         Basic ADL's: OT Eating, Grooming, Bathing, Dressing, Toileting     Advanced  ADL's: OT None     Transfers: PT Bed Mobility, Bed to Chair, Car, Other (comment)  OT Toilet, Tub/Shower     Locomotion: PT Ambulation, Wheelchair Mobility, Stairs     Additional Impairments: OT None  SLP        TR      Anticipated Outcomes Item Anticipated Outcome  Self Feeding Mod I  Swallowing      Basic self-care  Mod I  Toileting  Mod I   Bathroom Transfers Mod I  Bowel/Bladder  Manage bowels with medications/ manage bladder with toileting assistance  Transfers  supervision  Locomotion  supervision  Communication     Cognition     Pain  <4 w/ prns  Safety/Judgment  manage saety with supervision assistance   Therapy Plan: PT Intensity: Minimum of 1-2 x/day ,45 to 90 minutes PT Frequency: 5 out of 7 days PT Duration Estimated Length of Stay: 7-10 days OT Intensity: Minimum of 1-2 x/day, 45 to 90 minutes OT Frequency: 5 out of 7 days OT Duration/Estimated Length of Stay: 7-10 days     Team Interventions: Nursing Interventions Patient/Family Education, Medication Management, Bladder Management, Bowel Management, Disease Management/Prevention, Pain Management, Discharge Planning, Skin Care/Wound Management  PT interventions Ambulation/gait training, Community reintegration, Neuromuscular re-education, Psychosocial  support, DME/adaptive equipment instruction, Stair training, UE/LE Strength taining/ROM, Wheelchair propulsion/positioning, UE/LE Coordination activities, Therapeutic Activities, Skin care/wound management, Pain management, Discharge planning, Warden/ranger, Cognitive remediation/compensation, Disease management/prevention, Functional mobility training, Patient/family education, Therapeutic Exercise, Visual/perceptual remediation/compensation  OT Interventions Warden/ranger, DME/adaptive equipment instruction, Patient/family education, Therapeutic Activities, Wheelchair propulsion/positioning, Cognitive remediation/compensation, Functional electrical stimulation, Psychosocial support, Therapeutic Exercise, Community reintegration, Functional mobility training, Self Care/advanced ADL retraining, UE/LE Strength taining/ROM, Discharge planning, Neuromuscular re-education, Skin care/wound managment, UE/LE Coordination activities, Visual/perceptual remediation/compensation, Splinting/orthotics, Disease mangement/prevention, Pain management  SLP Interventions    TR Interventions    SW/CM Interventions Discharge Planning, Psychosocial Support, Patient/Family Education   Barriers to Discharge MD  Medical stability, Home enviroment access/loayout, Incontinence, Wound care, Lack of/limited family support, Weight bearing restrictions, and Behavior  Nursing Decreased caregiver support, Home environment access/layout, Incontinence, Weight bearing restrictions Discharge: House  Discharge Home Layout: One level  Discharge Home Access: Stairs to enter  Entrance Stairs-Rails: None  Entrance Stairs-Number of Steps: 1  PT None    OT Weight bearing restrictions, Neurogenic Bowel & Bladder    SLP      SW       Team Discharge Planning: Destination: PT-Home ,OT- Home , SLP-  Projected Follow-up: PT-Home health PT, OT-  Outpatient OT, SLP-  Projected Equipment Needs: PT-To be determined,  OT- To be determined, 3 in 1 bedside comode, Tub/shower bench, SLP-  Equipment Details: PT- , OT-  Patient/family involved in discharge planning: PT- Patient,  OT-Patient, SLP-   MD ELOS: 7-10 days Medical Rehab Prognosis:  Good Assessment: The patient has  been admitted for CIR therapies with the diagnosis of thoracic/spinal fx- Chance fx and L femur fx. The team will be addressing functional mobility, strength, stamina, balance, safety, adaptive techniques and equipment, self-care, bowel and bladder mgt, patient and caregiver education, . Goals have been set at Supervision to mod I. Anticipated discharge destination is home with husband.        See Team Conference Notes for weekly updates to the plan of care

## 2024-06-21 NOTE — Plan of Care (Signed)
  Problem: Consults Goal: RH GENERAL PATIENT EDUCATION Description: See Patient Education module for education specifics. Outcome: Progressing   Problem: RH BOWEL ELIMINATION Goal: RH STG MANAGE BOWEL WITH ASSISTANCE Description: STG Manage Bowel with supervision Assistance. Outcome: Progressing   Problem: RH BLADDER ELIMINATION Goal: RH STG MANAGE BLADDER WITH ASSISTANCE Description: STG Manage Bladder With supervision Assistance Outcome: Progressing   Problem: RH SKIN INTEGRITY Goal: RH STG SKIN FREE OF INFECTION/BREAKDOWN Description: Manage skin free of infection/breakdown with supervision assistance Outcome: Progressing   Problem: RH SAFETY Goal: RH STG ADHERE TO SAFETY PRECAUTIONS W/ASSISTANCE/DEVICE Description: STG Adhere to Safety Precautions With supervision  Assistance/Device. Outcome: Progressing   Problem: RH PAIN MANAGEMENT Goal: RH STG PAIN MANAGED AT OR BELOW PT'S PAIN GOAL Description: <4 w/ prns Outcome: Progressing   Problem: RH KNOWLEDGE DEFICIT GENERAL Goal: RH STG INCREASE KNOWLEDGE OF SELF CARE AFTER HOSPITALIZATION Description: Manage increase  knowledge of self care after hospitalization with supervision assistance from spouse using educational materials provided Outcome: Progressing

## 2024-06-21 NOTE — Evaluation (Signed)
 Recreational Therapy Assessment and Plan  Patient Details  Name: Kimberly [name] MRN: [id number] Date of Birth: 09-16-1956 Today's Date: 06/21/2024  Rehab Potential:  Good ELOS:   d/c 8/8  Assessment Hospital Problem: Principal Problem:   Thoracic spine fracture [facility]) Active Problems:   Femur fracture, left (HCC)     Past Medical History:      Past Medical History:  Diagnosis Date   Anemia      PMH: as a child   Arthritis     Asthma     CAD (coronary artery disease)      nonobstructive by cath, 6/08 (false positive Cardiolite ) normal stress echo, 7/11   CHF (congestive heart failure) (HCC)     Chronic back pain     Chronic bronchitis (HCC)     Cirrhosis (HCC)     Complication of anesthesia      had an asthma attack when woke up from procedure   Complication of anesthesia      asthma attack with one surgery   COPD (chronic obstructive pulmonary disease) (HCC)     Degenerative joint disease     Depression     DJD (degenerative joint disease)     Fatty liver     Fibromyalgia     GERD (gastroesophageal reflux disease)     Glaucoma     Headache(784.0)     Heart failure, diastolic, chronic (HCC)     Heart murmur      PMH:As a child only   Hernia of abdominal cavity      upper and lower hernia   History of hiatal hernia     HTN (hypertension)     Hypertension     Hypothyroidism     IBS (irritable bowel syndrome)     Morbid obesity (HCC)     Neuropathy      associated with diabetes   Obstructive sleep apnea     Pancreatitis     Pinched nerve in neck     Pneumonia      as a child   PONV (postoperative nausea and vomiting)     Pulmonary hypertension (HCC)     Rheumatic fever      PMH: as a child   Seizures (HCC)      PMH: only as a child   Seizures (HCC)      in childhood   Shortness of breath     Sleep apnea     Spinal stenosis     Stroke (HCC)      12/09/2013        Past Surgical History:       Past Surgical History:  Procedure Laterality Date    ABDOMINAL HYSTERECTOMY       ABDOMINAL HYSTERECTOMY        partial hysterectomy   APPENDECTOMY       BACK SURGERY       BACK SURGERY        disc surgery with complications and damage to right side   BARIATRIC SURGERY        DS in June 2020   BILATERAL KNEE ARTHROSCOPY   2000, 2009   BIOPSY   06/16/2021    Procedure: BIOPSY;  Surgeon: Eartha Angelia Sieving, MD;  Location: [facility];  Service: Gastroenterology;;   BREAST CYST EXCISION Left     CARDIAC CATHETERIZATION        2009   CATARACT EXTRACTION W/ INTRAOCULAR LENS  IMPLANT, BILATERAL  CHOLECYSTECTOMY   11/22/1992   CHOLECYSTECTOMY       COLONOSCOPY N/A 08/01/2013    Procedure: COLONOSCOPY;  Surgeon: Claudis RAYMOND Rivet, MD;  Location: [facility];  Service: Endoscopy;  Laterality: N/A;  930   COLONOSCOPY N/A 08/03/2017    Procedure: COLONOSCOPY;  Surgeon: Rivet Claudis RAYMOND, MD;  Location: [facility];  Service: Endoscopy;  Laterality: N/A;  12:00   COLONOSCOPY WITH PROPOFOL  N/A 06/28/2023    Procedure: COLONOSCOPY WITH PROPOFOL ;  Surgeon: Eartha Angelia Sieving, MD;  [facility]: [facility];  Service: Gastroenterology;  Laterality: N/A;  12:30pm;asa 3   CYST EXCISION        Left breast   ESOPHAGOGASTRODUODENOSCOPY (EGD) WITH PROPOFOL  N/A 06/16/2021    Procedure: ESOPHAGOGASTRODUODENOSCOPY (EGD) WITH PROPOFOL ;  Surgeon: Eartha Angelia Sieving, MD;  [facility]: [facility];  Service: Gastroenterology;  Laterality: N/A;  12:50   ESOPHAGOGASTRODUODENOSCOPY (EGD) WITH PROPOFOL  N/A 06/28/2023    Procedure: ESOPHAGOGASTRODUODENOSCOPY (EGD) WITH PROPOFOL ;  Surgeon: Eartha Angelia Sieving, MD;  [facility]: [facility];  Service: Gastroenterology;  Laterality: N/A;  12:30pm;asa 3   EXCISIONAL HEMORRHOIDECTOMY       EYE SURGERY   11/22/1965   EYE SURGERY        bilateral cataract removal with lens implants   EYE SURGERY        growth removed from right eye lid   EYE SURGERY        bilateral eye surgery age 12 tto  straighten eyes'   FEMUR IM NAIL Left 06/08/2024    Procedure: INSERTION, INTRAMEDULLARY ROD, FEMUR;  Surgeon: Celena Sharper, MD;  Location: [facility] OR;  Service: Orthopedics;  Laterality: Left;   heel (other)   11/23/2003   HEEL SPUR SURGERY        Right heel - growth removal and rebuilding of heel   HEMORRHOID SURGERY       HERNIA REPAIR       KNEE ARTHROSCOPY Bilateral     LUMBAR LAMINECTOMY/DECOMPRESSION MICRODISCECTOMY Right 08/23/2014    Procedure: LUMBAR LAMINECTOMY/DECOMPRESSION MICRODISCECTOMY 1 LEVEL;  Surgeon: Victory DELENA Gunnels, MD;  Location: [facility];  Service: Neurosurgery;  Laterality: Right;  LUMBAR LAMINECTOMY/DECOMPRESSION MICRODISCECTOMY 1 LEVEL LUMBAR 2-3   LUMBAR PERCUTANEOUS PEDICLE SCREW 2 LEVEL N/A 06/08/2024    Procedure: THORACIC ELEVEN-LUMBAR ONE PERCUTANEOUS PEDICLE SCREW;  Surgeon: Debby Dorn MATSU, MD;  Location: [facility] OR;  Service: Neurosurgery;  Laterality: N/A;  T11-L1 Percutaneous Fusion   ORIF FEMUR FRACTURE Left 06/08/2024    Procedure: OPEN REDUCTION INTERNAL FIXATION FEMORAL SHAFT FRACTURE;  Surgeon: Celena Sharper, MD;  Location: [facility] OR;  Service: Orthopedics;  Laterality: Left;   ovaries removed   11/22/2001   PARTIAL HYSTERECTOMY   11/23/1979   SHOULDER ARTHROSCOPY WITH DISTAL CLAVICLE RESECTION Left 08/06/2022    Procedure: SHOULDER ARTHROSCOPY WITH DISTAL CLAVICLE RESECTION;  Surgeon: Sharl Selinda Dover, MD;  Location: [facility] OR;  Service: Orthopedics;  Laterality: Left;   SHOULDER ARTHROSCOPY WITH ROTATOR CUFF REPAIR Left 08/06/2022    Procedure: SHOULDER ARTHROSCOPY WITH ROTATOR CUFF REPAIR;  Surgeon: Sharl Selinda Dover, MD;  Location[facility] OR;  Service: Orthopedics;  Laterality: Left;   SHOULDER ARTHROSCOPY WITH SUBACROMIAL DECOMPRESSION Left 08/06/2022    Procedure: SHOULDER ARTHROSCOPY WITH SUBACROMIAL DECOMPRESSIONWITH EXTENSIVE DEBRIDEMENT;  Surgeon: Sharl Selinda Dover, MD;  Location: [facility] OR;  Service: Orthopedics;  Laterality: Left;   SPHINCTEROTOMY        spinahatomy   11/22/1990   TOTAL KNEE ARTHROPLASTY Right 11/05/2020    Procedure: RIGHT TOTAL  KNEE ARTHROPLASTY;  Surgeon: Barbarann Oneil BROCKS, MD;  Location: [facility] ORS;  Service: Orthopedics;  Laterality: Right;   TUBAL LIGATION   11/22/1973   TUBAL LIGATION              Assessment & Plan Clinical Impression: Kimberly [name] is a 68 year old female with history of non-obst CAD, COPD, cirrhosis of liver secondary to NASH, multiple back surgeries with lumbar radiculopathy, Bilateral quad weakness s/p R-TKR and pending replacement on the left, obesity s/p duodenal switch with resultant N/V/B/B incontinence, thrombocytopenia, macula degeneration (worse vision at nights) who was admitted on 06/08/24 after fall where she struck her head and onset of left hip and back pain with worsening of neuropathy LLE as well as numbness/tingling BUE. She had recently been started on gabapentin  and ambien  per reports. She was found to have displaced proximal left femur fracture, presacral hematoma with possible unusual Chance type Fx involving T 12 vertebral body and spinous process, mild to moderate central spinal canal stenosis T10-T11,   chronic DDD C6/7 with mild to moderate central spine stenosis and moderate degenerative changes right shoulder. She was taken to OR for ORIF with IM nailing left femur by Dr. Celena and open reduction with pedicle screw/rod construct T11-T12- L1 by Dr. Debby on the same day. Post op to wear TLSO when out of bed and is TTWB on LLE with recommendation of 2 week post op follow up on hip for imaging/sutures.     Hospital course significant for constipation which has resolved with addition of laxatives. BS being monitored due to history of hypoglycemia since DSS and follow up CBC showed pancytopenia. BMET not checked since admission. Vitamin D  deficiency noted @ 19.97. PT/OT was consulted and has been working with patient who is showing improvement in activity tolerance. She requires CGA to  min assist with ADLS and mobility. She was independent PTA and CIR recommended due to functional decline. .  Patient transferred to CIR on 06/18/2024 .    Pt presents with decreased activity tolerance, decreased functional mobility, decreased balance, feelings of stress Limiting pt's independence with leisure/community pursuits.  Met with pt today to discuss TR services including leisure education, activity analysis/modifications and stress management.  Also discussed the importance of social, emotional, spiritual health in addition to physical health and their effects on overall health and wellness.  Pt stated understanding.  Plan  Min 1 TR session >20 minutes duirng LOS  Recommendations for other services: None   Discharge Criteria: Patient will be discharged from TR if patient refuses treatment 3 consecutive times without medical reason.  If treatment goals not met, if there is a change in medical status, if patient makes no progress towards goals or if patient is discharged from hospital.  The above assessment, treatment plan, treatment alternatives and goals were discussed and mutually agreed upon: by patient  Kimberly [name] 06/21/2024, 8:48 AM

## 2024-06-21 NOTE — Progress Notes (Signed)
 PROGRESS NOTE   Subjective/Complaints:  Pt reports that trying to have BM on toilet- had tiny BM- LBM 7/30- Still having bad spasms- wondering can increase Robaxin ?  Per labs, pt's BUN up to 26- but Cr ok.   ROS: Per HPI   Pt denies SOB, abd pain, CP, N/V/C/D, and vision changes    Objective:   No results found. Recent Labs    06/18/24 1529 06/21/24 0500  WBC 3.9* 3.4*  HGB 9.7* 9.4*  HCT 29.4* 28.1*  PLT 203 223   Recent Labs    06/19/24 0456 06/21/24 0500  NA 138 136  K 4.1 4.6  CL 113* 110  CO2 18* 18*  GLUCOSE 86 83  BUN 16 26*  CREATININE 0.76 0.86  CALCIUM  7.2* 7.8*    Intake/Output Summary (Last 24 hours) at 06/21/2024 0839 Last data filed at 06/21/2024 9360 Gross per 24 hour  Intake 830 ml  Output 1025 ml  Net -195 ml        Physical Exam: Vital Signs Blood pressure (!) 114/57, pulse 71, temperature 98.4 F (36.9 C), resp. rate 17, height 5' (1.524 m), weight 59.4 kg, SpO2 100%.      General: awake, alert, appropriate, sitting on toilet in bathroom; OT in room;  NAD HENT: conjugate gaze; oropharynx moist CV: regular rate and rhythm; no JVD Pulmonary: CTA B/L; no W/R/R- good air movement GI: soft, NT, ND, (+)BS- slightly hyperactive Psychiatric: appropriate Neurological: Ox3    Comments: LLE limited by pain--foam dressing on small incisions left lateral thigh as well as linear area of ecchymosis.  - down entire L side lateral aspect of L upper leg  Neurologic: Cranial nerves II through XII intact, motor strength is 5/5 in bilateral deltoid, bicep, tricep, grip, 4-/5 RIght and 3-/5 Left hip flexor, knee extensors, ankle dorsiflexor and plantar flexor Sensory exam normal sensation to light touch  in bilateral upper and lower extremities Cerebellar exam normal finger to nose to finger  in bilateral upper extremities  Assessment/Plan: 1. Functional deficits which require 3+ hours per  day of interdisciplinary therapy in a comprehensive inpatient rehab setting. Physiatrist is providing close team supervision and 24 hour management of active medical problems listed below. Physiatrist and rehab team continue to assess barriers to discharge/monitor patient progress toward functional and medical goals  Care Tool:  Bathing              Bathing assist Assist Level: Minimal Assistance - Patient > 75%     Upper Body Dressing/Undressing Upper body dressing        Upper body assist Assist Level: Contact Guard/Touching assist    Lower Body Dressing/Undressing Lower body dressing            Lower body assist Assist for lower body dressing: Minimal Assistance - Patient > 75%     Toileting Toileting Toileting Activity did not occur (Clothing management and hygiene only): N/A (no void or bm)  Toileting assist Assist for toileting: Minimal Assistance - Patient > 75%     Transfers Chair/bed transfer  Transfers assist     Chair/bed transfer assist level: Minimal Assistance - Patient > 75%  Locomotion Ambulation   Ambulation assist      Assist level: Contact Guard/Touching assist Assistive device: Walker-rolling Max distance: 12'   Walk 10 feet activity   Assist     Assist level: Contact Guard/Touching assist Assistive device: Walker-rolling   Walk 50 feet activity   Assist Walk 50 feet with 2 turns activity did not occur: Safety/medical concerns (pain, weakness)         Walk 150 feet activity   Assist Walk 150 feet activity did not occur: Safety/medical concerns (pain, weakness)         Walk 10 feet on uneven surface  activity   Assist Walk 10 feet on uneven surfaces activity did not occur: Safety/medical concerns (pain, weakness)         Wheelchair     Assist Is the patient using a wheelchair?: No Type of Wheelchair: Manual Wheelchair activity did not occur: Safety/medical concerns (pain, weakness, TTWB  LLE)         Wheelchair 50 feet with 2 turns activity    Assist    Wheelchair 50 feet with 2 turns activity did not occur: Safety/medical concerns (pain, weakness, TTWB LLE)       Wheelchair 150 feet activity     Assist  Wheelchair 150 feet activity did not occur: Safety/medical concerns (pain, weakness, TTWB LLE)       Blood pressure (!) 114/57, pulse 71, temperature 98.4 F (36.9 C), resp. rate 17, height 5' (1.524 m), weight 59.4 kg, SpO2 100%.    Medical Problem List and Plan: 1. Functional deficits secondary to Polytrauma             -patient may not shower? Will check with OT- might be able to wrap over the brace and still shower             -ELOS/Goals: 7-10d  D/c date 06/29/24  Con't CIR PT and OT 2.  Antithrombotics: -DVT/anticoagulation:  Pharmaceutical: Lovenox              -antiplatelet therapy: N/A 3. Pain Management:  Hydrocodone  10 mg prn severe pain and 5 mg prn moderate pain. Premedicate prior to therapy--has a lot of anxiety regarding activity tolerance/pain.              --continues to have muscle spasms worse when in chair. Continue robaxin  750 mg tid. May need baclofen  additionally. 7/29- pt received scheduled pain meds this AM- but not prns- will d/w nursing 7/30- changed Norco to say can give within 30 minutes of scheduled Norco  7/31- will increase Robaxin  to 1000 mg 4x/day.  4. Mood/Behavior/Sleep: LCSW to follow for evaluation and support.              -antipsychotic agents: N/A. Team to provide ego support.             --melatonin prn for insomnia.   7/29- will add Buspar  5 mg TID for anxiety 5. Neuropsych/cognition: This patient is capable of making decisions on her own behalf. 6. Skin/Wound Care: Routine pressure relief measures.  7. Fluids/Electrolytes/Nutrition: Monitor I/O. Check CMET in am 8. T12 Chance Fx s/p fixation: Wear TLSO when out of bed 9. Displaced femur fx s/p ORIF: TTWB with follow up X rays in 2 weeks 10. H/o  Multifactorial Neuropathy: Continue gabapentin  BID.  Has chronic L2-3 stenosis which has worsened over time.   11.  H/o morbid Obesity s/p duodenal switch surgery: NO longer on meds for HTN, DM  and does not have sleep apnea.  Pt reports 150lb weight loss  --Reports has not been able to afford vitamins--discussed generic --options. --Resume copper . Change Vit C to B complex w/C.  7/30- pt was concerned wasn't getting vitamins- compared to acute and is.  12. H/o dumping syndrome:  Has had bowel and bladder incontinence since DSS. 7/29- questionable urinary retention now and feeling like cannot empty bowels as well- could have neurogenic bowel and bladder- will order bladder scans  q6 hours scheduled 7/30- no increased PVRs right now so far and no BM yesterday- will restart Senna- not colace, since causes looser stools,  7/31- LBM x2 this AM- on toilet for at least 1x- urinating- not checking PVRs/bladder scans that can see in chart. Had a bladder scan of 46cc yesterday- and Zero PVR x1.   13. H/o spinal stenosis: Multiple surgery with RLE weakness 14. Thrombocytopenia: Pancytopenia in the past felt to be due to nutritional/copper  deficiency.              --Platelets recovering from 85 to 118  7/29- plts up to 203k 15. Acute blood loss anemia: Recheck hgb in am  7/29- Hb stable at 9.5- but MCV 102- increasing  since admission- started on Vit B complex with Vit C- will con't to monitor trend since might need B12 injections.  16. Neutropenia: WBC- 6.5-->3.9.  Recheck in am.   7/29- is followed by Hematology for Neutropenia- Latest appt was ~ 1 month ago- WBC stable at 3.9  7/31- WBC down slightly to 3.4- will check 2x/week 17. Vitamin D  deficiency- 19.97: Change cholecalciferol  to ergocalciferol  added.   18. COPD: Hx of cough/second hand smoke exposure. Continue Breztri .  19. Hypotension: Will monitor weights daily. TEDs for edema control.  --D/c lasix . TEDs for edema control LLE. May need binder if  orthostatic.  7/29- will see if needs to add midodrine.  20. Hypothyroid: Continue supplement.   21. Hypocalcemia  7/29- Ca is 7.2- but albumin  is 2.5- will check Ionized Calcium  tomorrow to make sure if needs to replete, esp with Duodenal switch surgery.   7/30- is pending. 7/31- iCa good at 4.7-and Ca up to 7.8 on BMP today     I spent a total of 41    minutes on total care today- >50% coordination of care- due to  D/w pt and OT about pt's care and concerns- reviewed labs and vitals and B/B measurements- as well as notes from last 24 hours.     LOS: 3 days A FACE TO FACE EVALUATION WAS PERFORMED  Tanis Hensarling 06/21/2024, 8:39 AM

## 2024-06-21 NOTE — Progress Notes (Signed)
 Physical Therapy Session Note  Patient Details  Name: Kimberly [name] MRN: [id number] Date of Birth: Apr 24, 1956  Today's Date: 06/21/2024 PT Individual Time: 1300-1330, 1415-1500 PT Individual Time Calculation (min): 45 min,  Short Term Goals: Week 1:  PT Short Term Goal 1 (Week 1): = LTG  Skilled Therapeutic Interventions/Progress Updates:     Session 1: Pt received supine in bed and was agreeable to therapy. Pt reported 8/10 pain in back and around incision over L hip. Back pain addressed by donning TLSO and with physical movement. Pt donned TLSO w/ setup and supervision to ensure proper placement of brace. L hip pain addressed with ice pack at end of second session. Patient ambulated 83ft to day room gym w/ CGA and RW to improve cardiovascular endurance and decrease risk of falls. Pt then performed 1x10 B seated marching to improve BLE strength and reduce fall risk. After seated rest break, pt ambulated back to room w/ RW and CGA. Pt sat EOB and doffed TLSO w/ supervision, returned to supine w/ min A lifting BLE into bed. Pt left with call bell and all other needs within reach. Bed alarm on.   Session 2: Pt received supine in bed and was agreeable to therapy. Pt reported 8/10 pain in both her back and L hip. Back pain addressed with physical movement and L hip pain addressed with ice pack at end of session. Pt performed bed mobility with supervision and donned TLSO w/ supervision. Pt ambulated 160ft down to main rehab gym to improve endurance, stopping halfway for seated rest break. Seated on mat table, pt reclined posteriorly outside BOS to perform sit-up back to center to improve functional activity tolerance and core strength. Pt required CGA for safety/balance. 2x10 reps performed. Pt then ambulated 177ft back to room w/o rest break, returning to bed where she doffed TLSO with supervision and required minA to lift BLE into bed. Pt left with call bell and all other needs within reach.  Bed alarm on.    Therapy Documentation Precautions:  Precautions Precautions: Back Precaution Booklet Issued: Yes (comment) Recall of Precautions/Restrictions: Intact Precaution/Restrictions Comments: TTWB LLE, TSLO OOB, NO BLT; pt able to recall Required Braces or Orthoses: Spinal Brace Spinal Brace: Thoracolumbosacral orthotic, Applied in sitting position Restrictions Weight Bearing Restrictions Per Provider Order: Yes LLE Weight Bearing Per Provider Order: Touchdown weight bearing Other Position/Activity Restrictions: TTWB LLE General:   Vital Signs: Therapy Vitals Temp: 98.5 F (36.9 C) Temp Source: Oral Pulse Rate: 72 Resp: 18 BP: 105/67 Patient Position (if appropriate): Sitting Oxygen  Therapy SpO2: 100 % O2 Device: Room Air Pain: Pain Assessment Pain Scale: 0-10 Pain Score: 8  Pain Location: Back Pain Intervention(s): Medication (See eMAR)    Therapy/Group: Individual Therapy  Oneil Grumbles 06/21/2024, 3:39 PM

## 2024-06-22 ENCOUNTER — Other Ambulatory Visit (INDEPENDENT_AMBULATORY_CARE_PROVIDER_SITE_OTHER): Payer: Self-pay | Admitting: Gastroenterology

## 2024-06-22 LAB — GLUCOSE, CAPILLARY
Glucose-Capillary: 87 mg/dL (ref 70–99)
Glucose-Capillary: 94 mg/dL (ref 70–99)
Glucose-Capillary: 85 mg/dL (ref 70–99)
Glucose-Capillary: 87 mg/dL (ref 70–99)

## 2024-06-22 MED ORDER — HYDROCERIN EX CREA
TOPICAL_CREAM | Freq: Two times a day (BID) | CUTANEOUS | Status: DC
  Filled 2024-06-22: qty 113

## 2024-06-22 NOTE — Progress Notes (Signed)
 Physical Therapy Session Note  Patient Details  Name: Kimberly [name] MRN: [id number] Date of Birth: 04/14/1956  Today's Date: 06/22/2024 PT Individual Time: 8361-8296 PT Individual Time Calculation (min): 25 min   Short Term Goals: Week 1:  PT Short Term Goal 1 (Week 1): = LTG  Skilled Therapeutic Interventions/Progress Updates:     Pt received semi reclined in bed and agrees to therapy. Reports 7/10 pain in LLE and low back, and reportedly had taken pain meds prior to session> PT provides rest breaks as needed and gentle mobility to manage pain. Pt performs supine therex for NMR and strengthening of BLEs. Pt completes x25 ankle pumps, x15 quad sets, glute sets, hip abduction, and SLRs with AAROM for LLE. Pt splits SLRs into 3 sets of 5. PT provides verbal and tactile cues throughout for NM feedback and correct performance. Following, pt performs supine to sit with cues for logrolling and positioning. PT provides modA for donning TLSO while pt is seated at EOB. Pt performs stand step to [facility] with CGA and cues for sequencing and positioning. Left seated with all needs within reach.   Therapy Documentation Precautions:  Precautions Precautions: Back Precaution Booklet Issued: Yes (comment) Recall of Precautions/Restrictions: Intact Precaution/Restrictions Comments: TTWB LLE, TSLO OOB, NO BLT; pt able to recall Required Braces or Orthoses: Spinal Brace Spinal Brace: Thoracolumbosacral orthotic, Applied in sitting position Restrictions Weight Bearing Restrictions Per Provider Order: Yes LLE Weight Bearing Per Provider Order: Touchdown weight bearing Other Position/Activity Restrictions: TTWB LLE  Therapy/Group: Individual Therapy  Elsie JAYSON Dawn, Pt, DPT 06/22/2024, 5:27 PM

## 2024-06-22 NOTE — Progress Notes (Signed)
 Physical Therapy Session Note  Patient Details  Name: Kimberly [name] MRN: [id number] Date of Birth: [birth year]  Today's Date: 06/22/2024 PT Individual Time: 0830-0930 PT Individual Time Calculation (min): 60 min   Short Term Goals: Week 1:  PT Short Term Goal 1 (Week 1): = LTG  Skilled Therapeutic Interventions/Progress Updates:    Pt in bed when PT arrived, pt agreeable for PT.  Pt reports 8/10 left upper thigh. Pt able to demonstrate log rolling to EOB with bed railings, supervision.  Pt transferred from bed to w/c with RW with supervision, keeping with TTWB.  Pt wheeled to day gym to perform LE ex's with 0.5# ankle wts, LLE,  2# ankle wts: marching, step outs, LAQs, ankle df/pf; 3x10 each, leg extended and elevated on leg rest: knee flexion/extension x15.  Pt wheeled back to room, pt asked to use restroom.  Pt wheeled to bathroom.  Pt able to demonstrate w/c<->toilet transfers with supervision, pt able to stand with RW and donn/doff lower body garments without LOB, good balance.  Pt wheeled back in room left in w/c with pillow support LB, LE's elevated, all items within reach.   Therapy Documentation Precautions:  Precautions Precautions: Back Precaution Booklet Issued: Yes (comment) Recall of Precautions/Restrictions: Intact Precaution/Restrictions Comments: TTWB LLE, TSLO OOB, NO BLT; pt able to recall Required Braces or Orthoses: Spinal Brace Spinal Brace: Thoracolumbosacral orthotic, Applied in sitting position Restrictions Weight Bearing Restrictions Per Provider Order: Yes LLE Weight Bearing Per Provider Order: Touchdown weight bearing Other Position/Activity Restrictions: TTWB LLE    Pain: Pain Assessment Pain Scale: 0-10 Pain Score: 8  Pain Location: Back Pain Intervention(s): Medication (See eMAR)     Therapy/Group: Individual Therapy  [name] Fast 06/22/2024, 8:53 AM

## 2024-06-22 NOTE — Progress Notes (Signed)
 Occupational Therapy Session Note  Patient Details  Name: Kimberly [name] MRN: [id number] Date of Birth: 1955/12/07  Today's Date: 06/22/2024 OT Individual Time: 0930-1015 OT Individual Time Calculation (min): 45 min    Short Term Goals: Week 1:  OT Short Term Goal 1 (Week 1): Pt will complete toileting at CGA with LRAD OT Short Term Goal 2 (Week 1): Pt will complete LB dressing at CGA with AE as necessary OT Short Term Goal 3 (Week 1): Pt will complete transfera at [facility] with LRAD  Skilled Therapeutic Interventions/Progress Updates:    1:1 Engaged in self care retraining at sink level with focus on continued practice of AE for LB dressing. Pt able to don shirt and brace in supportive sitting position. Pt practiced with reacher to doff and doff LB clothing and using the sock aide for donning socks. Encouraged use of shoes today instead of gripper socks to try since she is transferring and standing better. Pt able to perform sit to stands with contact guard to sit to stand and is maintaining her weight bearing status. Assisted with washing pt's hair at the sink today. Pt left sitting up in wc at end of session .   Therapy Documentation Precautions:  Precautions Precautions: Back Precaution Booklet Issued: Yes (comment) Recall of Precautions/Restrictions: Intact Precaution/Restrictions Comments: TTWB LLE, TSLO OOB, NO BLT; pt able to recall Required Braces or Orthoses: Spinal Brace Spinal Brace: Thoracolumbosacral orthotic, Applied in sitting position Restrictions Weight Bearing Restrictions Per Provider Order: Yes LLE Weight Bearing Per Provider Order: Touchdown weight bearing Other Position/Activity Restrictions: TTWB LLE  Pain: Pain Assessment Pain Scale: 0-10 Pain Score: 7  Pain Location: Back Pain Intervention(s): Medication (See eMAR) rest breaks as needed    Therapy/Group: Individual Therapy  [name] [facility] 06/22/2024, 2:26 PM

## 2024-06-22 NOTE — Progress Notes (Signed)
 PROGRESS NOTE   Subjective/Complaints:   Pt reports trying to wake up- voiding a lot-  didn't remember me this AM- thought I was SW. LBM this AM In bathroom.  Back and legs spasming- pain 7-8/10 but it's about the same as it was before came into hospital due to Fibromyalgia.  But said pain is more in back and legs.   Skin dry and itching in L neck /chest area.    ROS: Per HPI   Pt denies SOB, abd pain, CP, N/V/C/D, and vision changes     Objective:   No results found. Recent Labs    06/21/24 0500  WBC 3.4*  HGB 9.4*  HCT 28.1*  PLT 223   Recent Labs    06/21/24 0500  NA 136  K 4.6  CL 110  CO2 18*  GLUCOSE 83  BUN 26*  CREATININE 0.86  CALCIUM  7.8*    Intake/Output Summary (Last 24 hours) at 06/22/2024 1851 Last data filed at 06/22/2024 1800 Gross per 24 hour  Intake 236 ml  Output 975 ml  Net -739 ml        Physical Exam: Vital Signs Blood pressure 116/63, pulse 61, temperature 97.9 F (36.6 C), temperature source Oral, resp. rate 16, height 5' (1.524 m), weight 59.5 kg, SpO2 100%.       General: awake, alert, appropriate, sitting up in bed; NAD HENT: conjugate gaze; oropharynx moist CV: regular rate and rhythm; no JVD Pulmonary: CTA B/L; no W/R/R- good air movement GI: soft, NT, ND, (+)BS Psychiatric: appropriate; very interactive Neurological: Ox3    Comments: LLE limited by pain--foam dressing on small incisions left lateral thigh as well as linear area of ecchymosis.  - down entire L side lateral aspect of L upper leg  Neurologic: Cranial nerves II through XII intact, motor strength is 5/5 in bilateral deltoid, bicep, tricep, grip, 4-/5 RIght and 3-/5 Left hip flexor, knee extensors, ankle dorsiflexor and plantar flexor Sensory exam normal sensation to light touch  in bilateral upper and lower extremities Cerebellar exam normal finger to nose to finger  in bilateral upper  extremities  Assessment/Plan: 1. Functional deficits which require 3+ hours per day of interdisciplinary therapy in a comprehensive inpatient rehab setting. Physiatrist is providing close team supervision and 24 hour management of active medical problems listed below. Physiatrist and rehab team continue to assess barriers to discharge/monitor patient progress toward functional and medical goals  Care Tool:  Bathing    Body parts bathed by patient: Right arm, Left arm, Chest, Abdomen, Right upper leg, Left upper leg, Face   Body parts bathed by helper: Front perineal area, Buttocks, Right lower leg, Left lower leg     Bathing assist Assist Level: Moderate Assistance - Patient 50 - 74%     Upper Body Dressing/Undressing Upper body dressing   What is the patient wearing?: Pull over shirt, Orthosis    Upper body assist Assist Level: Supervision/Verbal cueing    Lower Body Dressing/Undressing Lower body dressing      What is the patient wearing?: Pants     Lower body assist Assist for lower body dressing: Minimal Assistance - Patient > 75%  Toileting Toileting Toileting Activity did not occur Press photographer and hygiene only):  (void)  Toileting assist Assist for toileting: Minimal Assistance - Patient > 75% Assistive Device Comment: RW   Transfers Chair/bed transfer  Transfers assist     Chair/bed transfer assist level: Supervision/Verbal cueing     Locomotion Ambulation   Ambulation assist      Assist level: Contact Guard/Touching assist Assistive device: Walker-rolling Max distance: 12'   Walk 10 feet activity   Assist     Assist level: Contact Guard/Touching assist Assistive device: Walker-rolling   Walk 50 feet activity   Assist Walk 50 feet with 2 turns activity did not occur: Safety/medical concerns (pain, weakness)  Assist level: Contact Guard/Touching assist Assistive device: Walker-rolling    Walk 150 feet  activity   Assist Walk 150 feet activity did not occur: Safety/medical concerns (pain, weakness)  Assist level: Contact Guard/Touching assist Assistive device: Walker-rolling    Walk 10 feet on uneven surface  activity   Assist Walk 10 feet on uneven surfaces activity did not occur: Safety/medical concerns (pain, weakness)         Wheelchair     Assist Is the patient using a wheelchair?: No Type of Wheelchair: Manual Wheelchair activity did not occur: Safety/medical concerns (pain, weakness, TTWB LLE)         Wheelchair 50 feet with 2 turns activity    Assist    Wheelchair 50 feet with 2 turns activity did not occur: Safety/medical concerns (pain, weakness, TTWB LLE)       Wheelchair 150 feet activity     Assist  Wheelchair 150 feet activity did not occur: Safety/medical concerns (pain, weakness, TTWB LLE)       Blood pressure 116/63, pulse 61, temperature 97.9 F (36.6 C), temperature source Oral, resp. rate 16, height 5' (1.524 m), weight 59.5 kg, SpO2 100%.    Medical Problem List and Plan: 1. Functional deficits secondary to Polytrauma             -patient may not shower? Will check with OT- might be able to wrap over the brace and still shower             -ELOS/Goals: 7-10d  D/c date 06/29/24  Con't CIR PT and OT 2.  Antithrombotics: -DVT/anticoagulation:  Pharmaceutical: Lovenox              -antiplatelet therapy: N/A 3. Pain Management:  Hydrocodone  10 mg prn severe pain and 5 mg prn moderate pain. Premedicate prior to therapy--has a lot of anxiety regarding activity tolerance/pain.              --continues to have muscle spasms worse when in chair. Continue robaxin  750 mg tid. May need baclofen  additionally. 7/29- pt received scheduled pain meds this AM- but not prns- will d/w nursing 7/30- changed Norco to say can give within 30 minutes of scheduled Norco  7/31- will increase Robaxin  to 1000 mg 4x/day.  8/1- pain slightly better 4.  Mood/Behavior/Sleep: LCSW to follow for evaluation and support.              -antipsychotic agents: N/A. Team to provide ego support.             --melatonin prn for insomnia.   7/29- will add Buspar  5 mg TID for anxiety 5. Neuropsych/cognition: This patient is capable of making decisions on her own behalf. 6. Skin/Wound Care: Routine pressure relief measures.  7. Fluids/Electrolytes/Nutrition: Monitor I/O. Check CMET in am 8. T12 Chance  Fx s/p fixation: Wear TLSO when out of bed 9. Displaced femur fx s/p ORIF: TTWB with follow up X rays in 2 weeks 10. H/o Multifactorial Neuropathy: Continue gabapentin  BID.  Has chronic L2-3 stenosis which has worsened over time.   11.  H/o morbid Obesity s/p duodenal switch surgery: NO longer on meds for HTN, DM  and does not have sleep apnea. Pt reports 150lb weight loss  --Reports has not been able to afford vitamins--discussed generic --options. --Resume copper . Change Vit C to B complex w/C.  7/30- pt was concerned wasn't getting vitamins- compared to acute and is.  12. H/o dumping syndrome:  Has had bowel and bladder incontinence since DSS. 7/29- questionable urinary retention now and feeling like cannot empty bowels as well- could have neurogenic bowel and bladder- will order bladder scans  q6 hours scheduled 7/30- no increased PVRs right now so far and no BM yesterday- will restart Senna- not colace, since causes looser stools,  7/31- LBM x2 this AM- on toilet for at least 1x- urinating- not checking PVRs/bladder scans that can see in chart. Had a bladder scan of 46cc yesterday- and Zero PVR x1.   13. H/o spinal stenosis: Multiple surgery with RLE weakness 14. Thrombocytopenia: Pancytopenia in the past felt to be due to nutritional/copper  deficiency.              --Platelets recovering from 85 to 118  7/29- plts up to 203k 15. Acute blood loss anemia: Recheck hgb in am  7/29- Hb stable at 9.5- but MCV 102- increasing  since admission- started on Vit B  complex with Vit C- will con't to monitor trend since might need B12 injections.  16. Neutropenia: WBC- 6.5-->3.9.  Recheck in am.   7/29- is followed by Hematology for Neutropenia- Latest appt was ~ 1 month ago- WBC stable at 3.9  7/31- WBC down slightly to 3.4- will check 2x/week 17. Vitamin D  deficiency- 19.97: Change cholecalciferol  to ergocalciferol  added.   18. COPD: Hx of cough/second hand smoke exposure. Continue Breztri .  19. Hypotension: Will monitor weights daily. TEDs for edema control.  --D/c lasix . TEDs for edema control LLE. May need binder if orthostatic.  7/29- will see if needs to add midodrine.  8/1- haven't heard from therapy needs to add midodrine 20. Hypothyroid: Continue supplement.   21. Hypocalcemia  7/29- Ca is 7.2- but albumin  is 2.5- will check Ionized Calcium  tomorrow to make sure if needs to replete, esp with Duodenal switch surgery.   7/30- is pending. 7/31- iCa good at 4.7-and Ca up to 7.8 on BMP today    I spent a total of  52  minutes on total care today- >50% coordination of care- answered multiple questions- in room for a total of 32 minutes- also reviewed chart and spoke to nursing and therapy-     LOS: 4 days A FACE TO FACE EVALUATION WAS PERFORMED  Kimberly [name] 06/22/2024, 6:51 PM

## 2024-06-22 NOTE — Progress Notes (Signed)
 Recreational Therapy Session Note  Patient Details  Name: Kimberly [name] MRN: [id number] Date of Birth: 06/14/1956 Today's Date: 06/22/2024 Goal:  Pt will be provided with education on stress management.  MET   Pain: c/o pain, premedicated, nurse aware Skilled Therapeutic Interventions/Progress Updates:  Pt participated in stress managment/coping group today.  Pt education/discussion focused on stress exploration including factors that contribute to stress, factors that protect against stress and potential coping strategies.  Coping strategies included deep breathing, progressive muscle relaxation, imagery & challenging irrational thoughts.  Handouts provided.  Therapy/Group: Individual Therapy   Glendy Barsanti 06/22/2024, 12:24 PM

## 2024-06-22 NOTE — Progress Notes (Signed)
 Occupational Therapy Session Note  Patient Details  Name: ARALYNN BRAKE MRN: [id number] Date of Birth: 04-14-1956  Today's Date: 06/22/2024 OT Individual Time: 8581-8484 OT Individual Time Calculation (min): 57 min    Short Term Goals: Week 1:  OT Short Term Goal 1 (Week 1): Pt will complete toileting at CGA with LRAD OT Short Term Goal 2 (Week 1): Pt will complete LB dressing at CGA with AE as necessary OT Short Term Goal 3 (Week 1): Pt will complete transfera at [facility] with LRAD  Skilled Therapeutic Interventions/Progress Updates:    Pt received supine with 8/10 pain in her back, reporting she is premedicated, agreeable to OT session. She came to EOB with excellent log rolling precautions- (S) using bed rail. She completed 15 ft of functional mobility to the bathroom with CGA using the RW. She completed toileting tasks, voiding urine, with close (S). She completed 200 ft more of functional mobility with the RW with close (S)- min cueing for TDWB LLE. Very slow pace to adhere to precautions. Completed to challenge functional activity tolerance and generalized strengthening for ADL transfers. Pt completed the BUE ergometer to challenge BUE strength and endurance needed to complete ADLs and IADLs with the highest level of independence. Pt completed 5 min, rest, and then an additional 8 min with cueing for pacing and technique. She ended with an additional 50 ft of functional mobility with the RW with close (S). Pt left supine with all needs met, bed alarm set.    Therapy Documentation Precautions:  Precautions Precautions: Back Precaution Booklet Issued: Yes (comment) Recall of Precautions/Restrictions: Intact Precaution/Restrictions Comments: TTWB LLE, TSLO OOB, NO BLT; pt able to recall Required Braces or Orthoses: Spinal Brace Spinal Brace: Thoracolumbosacral orthotic, Applied in sitting position Restrictions Weight Bearing Restrictions Per Provider Order: Yes LLE Weight Bearing Per  Provider Order: Touchdown weight bearing Other Position/Activity Restrictions: TTWB LLE  Therapy/Group: Individual Therapy  [name] 06/22/2024, 9:43 AM

## 2024-06-23 LAB — GLUCOSE, CAPILLARY
Glucose-Capillary: 82 mg/dL (ref 70–99)
Glucose-Capillary: 92 mg/dL (ref 70–99)
Glucose-Capillary: 87 mg/dL (ref 70–99)
Glucose-Capillary: 121 mg/dL — ABNORMAL HIGH (ref 70–99)

## 2024-06-23 NOTE — Progress Notes (Signed)
 PROGRESS NOTE   Subjective/Complaints:   Pt reports that IV is really irritating her- would like it removed.   Sleeping pretty well.  Frustrated with CBGs as well.  Increased in robaxin  has helped moderately.  But had a rough last night- felt like didn't get her pain meds in timely manner.  LBM yesterday  Would like melatonin scheduled, which is is.  ROS: Per HPI   Pt denies SOB, abd pain, CP, N/V/C/D, and vision changes     Objective:   No results found. Recent Labs    06/21/24 0500  WBC 3.4*  HGB 9.4*  HCT 28.1*  PLT 223   Recent Labs    06/21/24 0500  NA 136  K 4.6  CL 110  CO2 18*  GLUCOSE 83  BUN 26*  CREATININE 0.86  CALCIUM  7.8*    Intake/Output Summary (Last 24 hours) at 06/23/2024 1724 Last data filed at 06/23/2024 1300 Gross per 24 hour  Intake 236 ml  Output 250 ml  Net -14 ml        Physical Exam: Vital Signs Blood pressure 117/69, pulse 62, temperature 98.5 F (36.9 C), resp. rate 16, height 5' (1.524 m), weight 60.3 kg, SpO2 100%.       General: awake, alert, appropriate,  supine in bed; NAD HENT: conjugate gaze; oropharynx dry CV: regular rate and rhythm; no JVD Pulmonary: CTA B/L; no W/R/R- good air movement GI: soft, NT, ND, (+)BS Psychiatric: appropriate Neurological: Ox3 Skin- IV in forearm- loose   Comments: LLE limited by pain--foam dressing on small incisions left lateral thigh as well as linear area of ecchymosis.  - down entire L side lateral aspect of L upper leg  Neurologic: Cranial nerves II through XII intact, motor strength is 5/5 in bilateral deltoid, bicep, tricep, grip, 4-/5 RIght and 3-/5 Left hip flexor, knee extensors, ankle dorsiflexor and plantar flexor Sensory exam normal sensation to light touch  in bilateral upper and lower extremities Cerebellar exam normal finger to nose to finger  in bilateral upper extremities  Assessment/Plan: 1.  Functional deficits which require 3+ hours per day of interdisciplinary therapy in a comprehensive inpatient rehab setting. Physiatrist is providing close team supervision and 24 hour management of active medical problems listed below. Physiatrist and rehab team continue to assess barriers to discharge/monitor patient progress toward functional and medical goals  Care Tool:  Bathing    Body parts bathed by patient: Right arm, Left arm, Chest, Abdomen, Right upper leg, Left upper leg, Face   Body parts bathed by helper: Front perineal area, Buttocks, Right lower leg, Left lower leg     Bathing assist Assist Level: Moderate Assistance - Patient 50 - 74%     Upper Body Dressing/Undressing Upper body dressing   What is the patient wearing?: Pull over shirt, Orthosis    Upper body assist Assist Level: Supervision/Verbal cueing    Lower Body Dressing/Undressing Lower body dressing      What is the patient wearing?: Pants     Lower body assist Assist for lower body dressing: Minimal Assistance - Patient > 75%     Toileting Toileting Toileting Activity did not occur Press photographer and  hygiene only):  (void)  Toileting assist Assist for toileting: Minimal Assistance - Patient > 75% Assistive Device Comment: RW   Transfers Chair/bed transfer  Transfers assist     Chair/bed transfer assist level: Supervision/Verbal cueing     Locomotion Ambulation   Ambulation assist      Assist level: Contact Guard/Touching assist Assistive device: Walker-rolling Max distance: 12'   Walk 10 feet activity   Assist     Assist level: Contact Guard/Touching assist Assistive device: Walker-rolling   Walk 50 feet activity   Assist Walk 50 feet with 2 turns activity did not occur: Safety/medical concerns (pain, weakness)  Assist level: Contact Guard/Touching assist Assistive device: Walker-rolling    Walk 150 feet activity   Assist Walk 150 feet activity did not  occur: Safety/medical concerns (pain, weakness)  Assist level: Contact Guard/Touching assist Assistive device: Walker-rolling    Walk 10 feet on uneven surface  activity   Assist Walk 10 feet on uneven surfaces activity did not occur: Safety/medical concerns (pain, weakness)         Wheelchair     Assist Is the patient using a wheelchair?: No Type of Wheelchair: Manual Wheelchair activity did not occur: Safety/medical concerns (pain, weakness, TTWB LLE)         Wheelchair 50 feet with 2 turns activity    Assist    Wheelchair 50 feet with 2 turns activity did not occur: Safety/medical concerns (pain, weakness, TTWB LLE)       Wheelchair 150 feet activity     Assist  Wheelchair 150 feet activity did not occur: Safety/medical concerns (pain, weakness, TTWB LLE)       Blood pressure 117/69, pulse 62, temperature 98.5 F (36.9 C), resp. rate 16, height 5' (1.524 m), weight 60.3 kg, SpO2 100%.    Medical Problem List and Plan: 1. Functional deficits secondary to Polytrauma             -patient may not shower? Will check with OT- might be able to wrap over the brace and still shower             -ELOS/Goals: 7-10d  D/c date 06/29/24  Con't CIR PT and OT 2.  Antithrombotics: -DVT/anticoagulation:  Pharmaceutical: Lovenox              -antiplatelet therapy: N/A 3. Pain Management:  Hydrocodone  10 mg prn severe pain and 5 mg prn moderate pain. Premedicate prior to therapy--has a lot of anxiety regarding activity tolerance/pain.              --continues to have muscle spasms worse when in chair. Continue robaxin  750 mg tid. May need baclofen  additionally. 7/29- pt received scheduled pain meds this AM- but not prns- will d/w nursing 7/30- changed Norco to say can give within 30 minutes of scheduled Norco  7/31- will increase Robaxin  to 1000 mg 4x/day.  8/1- pain slightly better 8/2-pain moderately better 4. Mood/Behavior/Sleep: LCSW to follow for evaluation and  support.              -antipsychotic agents: N/A. Team to provide ego support.             --melatonin prn for insomnia.   7/29- will add Buspar  5 mg TID for anxiety  8/2- Melatonin now scheduled  5. Neuropsych/cognition: This patient is capable of making decisions on her own behalf. 6. Skin/Wound Care: Routine pressure relief measures.   8/2- wrote to remove IV 7. Fluids/Electrolytes/Nutrition: Monitor I/O. Check CMET in am 8.  T12 Chance Fx s/p fixation: Wear TLSO when out of bed 9. Displaced femur fx s/p ORIF: TTWB with follow up X rays in 2 weeks 10. H/o Multifactorial Neuropathy: Continue gabapentin  BID.  Has chronic L2-3 stenosis which has worsened over time.   11.  H/o morbid Obesity s/p duodenal switch surgery: NO longer on meds for HTN, DM  and does not have sleep apnea. Pt reports 150lb weight loss  --Reports has not been able to afford vitamins--discussed generic --options. --Resume copper . Change Vit C to B complex w/C.  7/30- pt was concerned wasn't getting vitamins- compared to acute and is.  12. H/o dumping syndrome:  Has had bowel and bladder incontinence since DSS. 7/29- questionable urinary retention now and feeling like cannot empty bowels as well- could have neurogenic bowel and bladder- will order bladder scans  q6 hours scheduled 7/30- no increased PVRs right now so far and no BM yesterday- will restart Senna- not colace, since causes looser stools,  7/31- LBM x2 this AM- on toilet for at least 1x- urinating- not checking PVRs/bladder scans that can see in chart. Had a bladder scan of 46cc yesterday- and Zero PVR x1.   13. H/o spinal stenosis: Multiple surgery with RLE weakness 14. Thrombocytopenia: Pancytopenia in the past felt to be due to nutritional/copper  deficiency.              --Platelets recovering from 85 to 118  7/29- plts up to 203k 15. Acute blood loss anemia: Recheck hgb in am  7/29- Hb stable at 9.5- but MCV 102- increasing  since admission- started on Vit  B complex with Vit C- will con't to monitor trend since might need B12 injections.  16. Neutropenia: WBC- 6.5-->3.9.  Recheck in am.   7/29- is followed by Hematology for Neutropenia- Latest appt was ~ 1 month ago- WBC stable at 3.9  7/31- WBC down slightly to 3.4- will check 2x/week 17. Vitamin D  deficiency- 19.97: Change cholecalciferol  to ergocalciferol  added.   18. COPD: Hx of cough/second hand smoke exposure. Continue Breztri .  19. Hypotension: Will monitor weights daily. TEDs for edema control.  --D/c lasix . TEDs for edema control LLE. May need binder if orthostatic.  7/29- will see if needs to add midodrine.  8/1- haven't heard from therapy needs to add midodrine 20. Hypothyroid: Continue supplement.   21. Hypocalcemia  7/29- Ca is 7.2- but albumin  is 2.5- will check Ionized Calcium  tomorrow to make sure if needs to replete, esp with Duodenal switch surgery.   7/30- is pending. 7/31- iCa good at 4.7-and Ca up to 7.8 on BMP today  22. CBG's  8/2- will stop CBGs since they have all been above 75 for last 5 days- lowest 78.   I spent a total of  37  minutes on total care today- >50% coordination of care- due to  Prolonged time in room answering questions and listening to pt concerns- spoke with nursing about IV and stopping CBgs   LOS: 5 days A FACE TO FACE EVALUATION WAS PERFORMED  Arcenio Mullaly 06/23/2024, 5:24 PM

## 2024-06-24 ENCOUNTER — Inpatient Hospital Stay (HOSPITAL_COMMUNITY)

## 2024-06-24 NOTE — Progress Notes (Signed)
 PROGRESS NOTE   Subjective/Complaints:   Pt reports IV site much better since removed.   Blah today. Doesn't feel great- didn' sleep deeply But feels she's backwards this AM.  LLE hurting more today- got meds 25 minutes ago- probably haven't kicked in yet.  Sutures and back incision itching.  .  ROS:  Per HPI  Pt denies SOB, abd pain, CP, N/V/C/D, and vision changes     Objective:   No results found. No results for input(s): WBC, HGB, HCT, PLT in the last 72 hours.  No results for input(s): NA, K, CL, CO2, GLUCOSE, BUN, CREATININE, CALCIUM  in the last 72 hours.   Intake/Output Summary (Last 24 hours) at 06/24/2024 0939 Last data filed at 06/24/2024 0700 Gross per 24 hour  Intake 236 ml  Output --  Net 236 ml        Physical Exam: Vital Signs Blood pressure 114/68, pulse 75, temperature 98.6 F (37 C), resp. rate 20, height 5' (1.524 m), weight 61.3 kg, SpO2 98%.        General: awake, alert, appropriate, supine in bed;  NAD HENT: conjugate gaze; oropharynx moist CV: regular rate and rhythm; no JVD Pulmonary: CTA B/L; no W/R/R- good air movement GI: soft, NT, ND, (+)BS Psychiatric: appropriate- extremely interactive- says feels Blah- appears depressed Neurological: Ox3    Comments: LLE sutures look good- healing well- no drainage- still has purple bruising down entire L lateral thigh Neurologic: Cranial nerves II through XII intact, motor strength is 5/5 in bilateral deltoid, bicep, tricep, grip, 4-/5 RIght and 3-/5 Left hip flexor, knee extensors, ankle dorsiflexor and plantar flexor Sensory exam normal sensation to light touch  in bilateral upper and lower extremities Cerebellar exam normal finger to nose to finger  in bilateral upper extremities  Assessment/Plan: 1. Functional deficits which require 3+ hours per day of interdisciplinary therapy in a comprehensive  inpatient rehab setting. Physiatrist is providing close team supervision and 24 hour management of active medical problems listed below. Physiatrist and rehab team continue to assess barriers to discharge/monitor patient progress toward functional and medical goals  Care Tool:  Bathing    Body parts bathed by patient: Right arm, Left arm, Chest, Abdomen, Right upper leg, Left upper leg, Face   Body parts bathed by helper: Front perineal area, Buttocks, Right lower leg, Left lower leg     Bathing assist Assist Level: Moderate Assistance - Patient 50 - 74%     Upper Body Dressing/Undressing Upper body dressing   What is the patient wearing?: Pull over shirt, Orthosis    Upper body assist Assist Level: Supervision/Verbal cueing    Lower Body Dressing/Undressing Lower body dressing      What is the patient wearing?: Pants     Lower body assist Assist for lower body dressing: Minimal Assistance - Patient > 75%     Toileting Toileting Toileting Activity did not occur (Clothing management and hygiene only):  (void)  Toileting assist Assist for toileting: Minimal Assistance - Patient > 75% Assistive Device Comment: RW   Transfers Chair/bed transfer  Transfers assist     Chair/bed transfer assist level: Supervision/Verbal cueing     Locomotion Ambulation  Ambulation assist      Assist level: Contact Guard/Touching assist Assistive device: Walker-rolling Max distance: 12'   Walk 10 feet activity   Assist     Assist level: Contact Guard/Touching assist Assistive device: Walker-rolling   Walk 50 feet activity   Assist Walk 50 feet with 2 turns activity did not occur: Safety/medical concerns (pain, weakness)  Assist level: Contact Guard/Touching assist Assistive device: Walker-rolling    Walk 150 feet activity   Assist Walk 150 feet activity did not occur: Safety/medical concerns (pain, weakness)  Assist level: Contact Guard/Touching  assist Assistive device: Walker-rolling    Walk 10 feet on uneven surface  activity   Assist Walk 10 feet on uneven surfaces activity did not occur: Safety/medical concerns (pain, weakness)         Wheelchair     Assist Is the patient using a wheelchair?: No Type of Wheelchair: Manual Wheelchair activity did not occur: Safety/medical concerns (pain, weakness, TTWB LLE)         Wheelchair 50 feet with 2 turns activity    Assist    Wheelchair 50 feet with 2 turns activity did not occur: Safety/medical concerns (pain, weakness, TTWB LLE)       Wheelchair 150 feet activity     Assist  Wheelchair 150 feet activity did not occur: Safety/medical concerns (pain, weakness, TTWB LLE)       Blood pressure 114/68, pulse 75, temperature 98.6 F (37 C), resp. rate 20, height 5' (1.524 m), weight 61.3 kg, SpO2 98%.    Medical Problem List and Plan: 1. Functional deficits secondary to Polytrauma             -patient may not shower? Will check with OT- might be able to wrap over the brace and still shower             -ELOS/Goals: 7-10d  D/c date 06/29/24  Con't CIR PT sn d OT Assured pt will speak with husband prior to d/c- will make sure she understands her TDWB is for 6-8 weeks on LLE- xray due- will order- also removing L thigh sutures per  time ot do so 2.  Antithrombotics: -DVT/anticoagulation:  Pharmaceutical: Lovenox              -antiplatelet therapy: N/A 3. Pain Management:  Hydrocodone  10 mg prn severe pain and 5 mg prn moderate pain. Premedicate prior to therapy--has a lot of anxiety regarding activity tolerance/pain.              --continues to have muscle spasms worse when in chair. Continue robaxin  750 mg tid. May need baclofen  additionally. 7/29- pt received scheduled pain meds this AM- but not prns- will d/w nursing 7/30- changed Norco to say can give within 30 minutes of scheduled Norco  7/31- will increase Robaxin  to 1000 mg 4x/day.  8/2-8/3 pain  moderately better but still rates as 7-8/10 4. Mood/Behavior/Sleep: LCSW to follow for evaluation and support.              -antipsychotic agents: N/A. Team to provide ego support.             --melatonin prn for insomnia.   7/29- will add Buspar  5 mg TID for anxiety  8/2- Melatonin now scheduled  5. Neuropsych/cognition: This patient is capable of making decisions on her own behalf. 6. Skin/Wound Care: Routine pressure relief measures.   8/2- wrote to remove IV  8/3- wrote to remove her Sutures on L thigh 7. Fluids/Electrolytes/Nutrition: Monitor I/O. Check  CMET in am 8. T12 Chance Fx s/p fixation: Wear TLSO when out of bed 9. Displaced femur fx s/p ORIF: TTWB with follow up X rays in 2 weeks 10. H/o Multifactorial Neuropathy: Continue gabapentin  BID.  Has chronic L2-3 stenosis which has worsened over time.   11.  H/o morbid Obesity s/p duodenal switch surgery: NO longer on meds for HTN, DM  and does not have sleep apnea. Pt reports 150lb weight loss  --Reports has not been able to afford vitamins--discussed generic --options. --Resume copper . Change Vit C to B complex w/C.  7/30- pt was concerned wasn't getting vitamins- compared to acute and is.  12. H/o dumping syndrome:  Has had bowel and bladder incontinence since DSS. 7/29- questionable urinary retention now and feeling like cannot empty bowels as well- could have neurogenic bowel and bladder- will order bladder scans  q6 hours scheduled 7/30- no increased PVRs right now so far and no BM yesterday- will restart Senna- not colace, since causes looser stools,  7/31- LBM x2 this AM- on toilet for at least 1x- urinating- not checking PVRs/bladder scans that can see in chart. Had a bladder scan of 46cc yesterday- and Zero PVR x1.   8/3- LBM yesterday 13. H/o spinal stenosis: Multiple surgery with RLE weakness 14. Thrombocytopenia: Pancytopenia in the past felt to be due to nutritional/copper  deficiency.              --Platelets recovering  from 85 to 118  7/29- plts up to 203k 15. Acute blood loss anemia: Recheck hgb in am  7/29- Hb stable at 9.5- but MCV 102- increasing  since admission- started on Vit B complex with Vit C- will con't to monitor trend since might need B12 injections.  16. Neutropenia: WBC- 6.5-->3.9.  Recheck in am.   7/29- is followed by Hematology for Neutropenia- Latest appt was ~ 1 month ago- WBC stable at 3.9  7/31- WBC down slightly to 3.4- will check 2x/week 17. Vitamin D  deficiency- 19.97: Change cholecalciferol  to ergocalciferol  added.   18. COPD: Hx of cough/second hand smoke exposure. Continue Breztri .  19. Hypotension: Will monitor weights daily. TEDs for edema control.  --D/c lasix . TEDs for edema control LLE. May need binder if orthostatic.  7/29- will see if needs to add midodrine.  8/1- haven't heard from therapy needs to add midodrine 20. Hypothyroid: Continue supplement.   21. Hypocalcemia  7/29- Ca is 7.2- but albumin  is 2.5- will check Ionized Calcium  tomorrow to make sure if needs to replete, esp with Duodenal switch surgery.   7/30- is pending. 7/31- iCa good at 4.7-and Ca up to 7.8 on BMP today  22. CBG's  8/2- will stop CBGs since they have all been above 75 for last 5 days- lowest 78.   I spent a total of  35  minutes on total care today- >50% coordination of care- due to  D/w nursing at length about sutures and how pt feels she heard something different from surgeons than her husband- she's concerned he will try and insist she bear weight through her LLE prior to day supposed to.   LOS: 6 days A FACE TO FACE EVALUATION WAS PERFORMED  Nolan Tuazon 06/24/2024, 9:39 AM

## 2024-06-24 NOTE — Progress Notes (Signed)
 Occupational Therapy Session Note  Patient Details  Name: Kimberly [name] MRN: [id number] Date of Birth: 04-16-1956  Today's Date: 06/24/2024 OT Individual Time: 0855-1005 OT Individual Time Calculation (min): 70 min    Short Term Goals: Week 1:  OT Short Term Goal 1 (Week 1): Pt will complete toileting at CGA with LRAD OT Short Term Goal 2 (Week 1): Pt will complete LB dressing at CGA with AE as necessary OT Short Term Goal 3 (Week 1): Pt will complete transfera at [facility] with LRAD  Skilled Therapeutic Interventions/Progress Updates:      Therapy Documentation Precautions:  Precautions Precautions: Back Precaution Booklet Issued: Yes (comment) Recall of Precautions/Restrictions: Intact Precaution/Restrictions Comments: TTWB LLE, TSLO OOB, NO BLT; pt able to recall Required Braces or Orthoses: Spinal Brace Spinal Brace: Thoracolumbosacral orthotic, Applied in sitting position Restrictions Weight Bearing Restrictions Per Provider Order: Yes LLE Weight Bearing Per Provider Order: Touchdown weight bearing Other Position/Activity Restrictions: TTWB LLE General: Pt supine in bed upon OT arrival, agreeable to OT session.  Pain:  6/10 pain reported in back, activity, intermittent rest breaks, distractions provided for pain management, pt reports tolerable to proceed.    ADL: OT providing skilled intervention on ADL retraining in order to increase independence with tasks and increase activity tolerance. Pt completed the following tasks at the current level of assist: Bed mobility: SBA with log roll technique UB dressing: SBA for overhead shirt and TLSO LB dressing: CGA for managing over waist in standing, able to thread over feet using reacher Footwear: total A for socks/TEDs  Bathing: sponge bathing at sink level at Min A, assistance required for feet, able to wash/dry all other body parts Transfers: CGA with RW stand pivot transfer with no LOB and good adherence to WB  precautions   Pt seated in W/C at end of session with W/C alarm donned, call light within reach and 4Ps assessed.    Therapy/Group: Individual Therapy  Camie Hoe, OTD, OTR/L 06/24/2024, 12:53 PM

## 2024-06-24 NOTE — Progress Notes (Signed)
 Physical Therapy Session Note  Patient Details  Name: Kimberly [name] MRN: [id number] Date of Birth: 08/30/1956  Today's Date: 06/24/2024 PT Individual Time: 8954-8844 + 1256-1336 PT Individual Time Calculation (min): 70 min + 40 min  Short Term Goals: Week 1:  PT Short Term Goal 1 (Week 1): = LTG  Skilled Therapeutic Interventions/Progress Updates:    Session 1: Chart reviewed and pt agreeable to therapy. Pt received semi-reclined in bed with 7/10 c/o pain in back and 6/10 c/o in LLE. Session focused on bed mobility, functional transfers, balance, and ambulation to promote functional recovery for out of bed mobility, safe home mobility and access, and activity tolerance. Pt donned TLSO at EOB with S. Pt initiated session with amb to chair using CGA + RW. Pt taken to therapy gym for time. Pt then completed 3 x 5 min rounds on NuStep pace partner activity at 30 spm and workload 1 with pt able to maintain spm. LLE maintained off NuStep t/o activity. Pt then amb 112ft using S + RW. Pt also amb to/from toilet using S + RW and required S for pericare. At end of session, pt was left seated in [facility] with nurse call bell and all needs in reach. NT noted of lack of alarm in room with NT verifying plan to retrieve alarm and place.   Session 2: Chart reviewed and pt agreeable to therapy. Pt received semi-reclined in bed with 7/10 c/o pain in back and leg. Session focused on functional transfers and step navigation to promote safe home mobility and access. Pt initiated session with transferred to EOB using S. Pt then completed donning of TLSO with S. Pt then transferred to [facility] using S + RW. Pt then completed blocked practice of step up/down on 5inch step using CGA + RW. Of note, pt expressed feeling uncomfortable with hop methods up step. Pt also not able to display appropriate floor clearance with hop on RLE while using RW. Pt adjusted to use shower chair. Pt then practiced sit>turn>stand method using shower chair  to navigate 5inch step similar to home entrance. Pt completed activity with CGA + RW.  Pt then returned to room and amb to bed with S + RW. At end of session, pt was left semi-reclined in bed with alarm engaged, nurse call bell and all needs in reach.    Therapy Documentation Precautions:  Precautions Precautions: Back Precaution Booklet Issued: Yes (comment) Recall of Precautions/Restrictions: Intact Precaution/Restrictions Comments: TTWB LLE, TSLO OOB, NO BLT; pt able to recall Required Braces or Orthoses: Spinal Brace Spinal Brace: Thoracolumbosacral orthotic, Applied in sitting position Restrictions Weight Bearing Restrictions Per Provider Order: Yes LLE Weight Bearing Per Provider Order: Touchdown weight bearing Other Position/Activity Restrictions: TTWB LLE General:      Therapy/Group: Individual Therapy  [name] 06/24/2024, 12:02 PM

## 2024-06-25 DIAGNOSIS — D709 Neutropenia, unspecified: Secondary | ICD-10-CM

## 2024-06-25 DIAGNOSIS — M545 Low back pain, unspecified: Secondary | ICD-10-CM

## 2024-06-25 DIAGNOSIS — S22082K Unstable burst fracture of T11-T12 vertebra, subsequent encounter for fracture with nonunion: Secondary | ICD-10-CM

## 2024-06-25 DIAGNOSIS — D62 Acute posthemorrhagic anemia: Secondary | ICD-10-CM

## 2024-06-25 DIAGNOSIS — K5900 Constipation, unspecified: Secondary | ICD-10-CM

## 2024-06-25 LAB — CBC
HCT: 30.7 % — ABNORMAL LOW (ref 36.0–46.0)
Hemoglobin: 10 g/dL — ABNORMAL LOW (ref 12.0–15.0)
MCH: 34.2 pg — ABNORMAL HIGH (ref 26.0–34.0)
MCHC: 32.6 g/dL (ref 30.0–36.0)
MCV: 105.1 fL — ABNORMAL HIGH (ref 80.0–100.0)
Platelets: 218 K/uL (ref 150–400)
RBC: 2.92 MIL/uL — ABNORMAL LOW (ref 3.87–5.11)
RDW: 17.2 % — ABNORMAL HIGH (ref 11.5–15.5)
WBC: 2.3 K/uL — ABNORMAL LOW (ref 4.0–10.5)
nRBC: 0 % (ref 0.0–0.2)

## 2024-06-25 LAB — CBC WITH DIFFERENTIAL/PLATELET
Abs Immature Granulocytes: 0.01 K/uL (ref 0.00–0.07)
Basophils Absolute: 0 K/uL (ref 0.0–0.1)
Basophils Relative: 1 %
Eosinophils Absolute: 0.1 K/uL (ref 0.0–0.5)
Eosinophils Relative: 4 %
HCT: 29.6 % — ABNORMAL LOW (ref 36.0–46.0)
Hemoglobin: 9.7 g/dL — ABNORMAL LOW (ref 12.0–15.0)
Immature Granulocytes: 0 %
Lymphocytes Relative: 28 %
Lymphs Abs: 0.8 K/uL (ref 0.7–4.0)
MCH: 34.4 pg — ABNORMAL HIGH (ref 26.0–34.0)
MCHC: 32.8 g/dL (ref 30.0–36.0)
MCV: 105 fL — ABNORMAL HIGH (ref 80.0–100.0)
Monocytes Absolute: 0.3 K/uL (ref 0.1–1.0)
Monocytes Relative: 12 %
Neutro Abs: 1.5 K/uL — ABNORMAL LOW (ref 1.7–7.7)
Neutrophils Relative %: 55 %
Platelets: 215 K/uL (ref 150–400)
RBC: 2.82 MIL/uL — ABNORMAL LOW (ref 3.87–5.11)
RDW: 17 % — ABNORMAL HIGH (ref 11.5–15.5)
WBC: 2.8 K/uL — ABNORMAL LOW (ref 4.0–10.5)
nRBC: 0 % (ref 0.0–0.2)

## 2024-06-25 LAB — BASIC METABOLIC PANEL WITH GFR
Anion gap: 8 (ref 5–15)
BUN: 16 mg/dL (ref 8–23)
CO2: 22 mmol/L (ref 22–32)
Calcium: 7.9 mg/dL — ABNORMAL LOW (ref 8.9–10.3)
Chloride: 110 mmol/L (ref 98–111)
Creatinine, Ser: 0.83 mg/dL (ref 0.44–1.00)
GFR, Estimated: >60 mL/min (ref >60)
Glucose, Bld: 87 mg/dL (ref 70–99)
Potassium: 4.3 mmol/L (ref 3.5–5.1)
Sodium: 140 mmol/L (ref 135–145)

## 2024-06-25 NOTE — Progress Notes (Signed)
 Occupational Therapy Session Note  Patient Details  Name: Kimberly [name] MRN: [id number] Date of Birth: 1956/11/17  Today's Date: 06/25/2024 OT Individual Time: 8494-8464 OT Individual Time Calculation (min): 30 min    Short Term Goals: Week 1:  OT Short Term Goal 1 (Week 1): Pt will complete toileting at CGA with LRAD OT Short Term Goal 2 (Week 1): Pt will complete LB dressing at CGA with AE as necessary OT Short Term Goal 3 (Week 1): Pt will complete transfera at [facility] with LRAD  Skilled Therapeutic Interventions/Progress Updates:      Therapy Documentation Precautions:  Precautions Precautions: Back Precaution Booklet Issued: Yes (comment) Recall of Precautions/Restrictions: Intact Precaution/Restrictions Comments: TTWB LLE, TSLO OOB, NO BLT; pt able to recall Required Braces or Orthoses: Spinal Brace Spinal Brace: Thoracolumbosacral orthotic, Applied in sitting position Restrictions Weight Bearing Restrictions Per Provider Order: Yes LLE Weight Bearing Per Provider Order: Touchdown weight bearing Other Position/Activity Restrictions: TTWB LLE General: Pt supine in bed upon OT arrival, agreeable to OT session.  Pain:  7/10 pain reported in back, TUOS, rest , distractions provided for pain management, pt reports tolerable to proceed.   Other Treatments: OT providing pt with eduaction and DME recs/needs before D/C. Pt stated has everything at home (W/C, [facility] and shower chair) but RW, OT will relate DME needs in conference tomorrow. OT educating pt on option for family training for husband before D/C in order to prepare. OT educating pt on white education binder in room for increased understanding of medical diagnoses and precautions. Pt also concerned for timing of medication, OT recommending pt keep track of times she took medications in order to increase pain management.   Pt supine in bed with bed alarm activated, 2 bed rails up, call light within reach and 4Ps  assessed.   Therapy/Group: Individual Therapy  Camie Hoe, OTD, OTR/L 06/25/2024, 3:46 PM

## 2024-06-25 NOTE — Progress Notes (Signed)
 Physical Therapy Session Note  Patient Details  Name: Kimberly [name] MRN: [id number] Date of Birth: 07-09-1956  Today's Date: 06/25/2024 PT Individual Time: 1015-1115 PT Individual Time Calculation (min): 60 min   Short Term Goals: Week 1:  PT Short Term Goal 1 (Week 1): = LTG  Skilled Therapeutic Interventions/Progress Updates:     Pt received upright in manual WC and was agreeable to therapy. Pt had previously donned TLSO and was adherent to TTWB LLE precaution throughout session. Pt reported 7/10 back pain/L thigh pain and stated, gesturing to her L leg, if it wasn't for this I'd be doing much better. Pt pain addressed through physical activity. Pt ambulated 64ft w/ RW and CGA before SPT decided to take pt outside for gait for holistic health benefit on mood and pt affect. Pt wheeled outside and ambulated 119ft w/ RW to improve gait tolerance and cardiovascular endurance. Pt required CGA progressing to SBA for safety. Pt required 2 prolonged rest breaks throughout session for fatigue management. Pt then wheeled back to room and returned to bed with ambulatory transfer w/ RW and SBA. Pt performed bed mobility with supervision. Pt given call bell and all other needs within reach. Bed alarm on.   Therapy Documentation Precautions:  Precautions Precautions: Back Precaution Booklet Issued: Yes (comment) Recall of Precautions/Restrictions: Intact Precaution/Restrictions Comments: TTWB LLE, TSLO OOB, NO BLT; pt able to recall Required Braces or Orthoses: Spinal Brace Spinal Brace: Thoracolumbosacral orthotic, Applied in sitting position Restrictions Weight Bearing Restrictions Per Provider Order: Yes LLE Weight Bearing Per Provider Order: Touchdown weight bearing Other Position/Activity Restrictions: TTWB LLE  Therapy/Group: Individual Therapy  Oneil Grumbles 06/25/2024, 4:11 PM

## 2024-06-25 NOTE — Progress Notes (Signed)
 Occupational Therapy Session Note  Patient Details  Name: Kimberly [name] MRN: [id number] Date of Birth: 1956/06/08  Today's Date: 06/25/2024 OT Individual Time: 1320-1410 OT Individual Time Calculation (min): 50 min    Short Term Goals: Week 1:  OT Short Term Goal 1 (Week 1): Pt will complete toileting at CGA with LRAD OT Short Term Goal 2 (Week 1): Pt will complete LB dressing at CGA with AE as necessary OT Short Term Goal 3 (Week 1): Pt will complete transfera at [facility] with LRAD  Skilled Therapeutic Interventions/Progress Updates:    Pt received supine with 7/10 pain in her L leg, agreeable to OT session after she finished eating lunch, missing first 10 min. Bed mobility to EOB with (S) using bed rail. She donned her TLSO with (S) EOB. She completed 125 ft of functional mobility to the therapy gym with the RW with good adherence to TDWB precautions. Pt required use of rest breaks throughout session for recovery, as well as to support safety and prevent overexertion. During breaks, OT monitored recovery time to assess endurance and response to exertion. Pt completed standing level L LE hip flexion with BUE support using a 3 in step 3x10 repetitions. Activity performed to challenge static standing balance and functional activity tolerance to simulate household threshold management and reduce fall risk. Pt required CGA overall. She was taken back to her room via w/c. She completed stand pivot to the toilet with (S) using the RW. She required min A to don new pants 2/2 urinary incontinence and change brief. She returned to supine in the bed and was left with all needs met.    Therapy Documentation Precautions:  Precautions Precautions: Back Precaution Booklet Issued: Yes (comment) Recall of Precautions/Restrictions: Intact Precaution/Restrictions Comments: TTWB LLE, TSLO OOB, NO BLT; pt able to recall Required Braces or Orthoses: Spinal Brace Spinal Brace: Thoracolumbosacral orthotic, Applied  in sitting position Restrictions Weight Bearing Restrictions Per Provider Order: Yes LLE Weight Bearing Per Provider Order: Touchdown weight bearing Other Position/Activity Restrictions: TTWB LLE  Therapy/Group: Individual Therapy  [name] 06/25/2024, 1:39 PM

## 2024-06-25 NOTE — Progress Notes (Addendum)
 PROGRESS NOTE   Subjective/Complaints: Continues to have some back pain and left leg pain.  This is worsened when she sits upright.  Current pain medications are helping keep this under control.  Reports had bowel movement yesterday. .  ROS:  Per HPI  Pt denies chills, SOB, abd pain, CP, N/V/C/D, and vision changes     Objective:   DG FEMUR MIN 2 VIEWS LEFT Result Date: 06/24/2024 CLINICAL DATA:  [id number] Pain of left femur [id number] EXAM: LEFT FEMUR 2 VIEWS COMPARISON:  06/08/2024. FINDINGS: Redemonstration of patient's pre-existing left femur intramedullary nail with 2 proximal and distal fixation screws. No significant interval change in alignment when compared to the prior exam. No other acute fracture or dislocation. No aggressive osseous lesion. Mild-to-moderate degenerative changes of left hip and knee joints again seen. No focal soft tissue swelling. No radiopaque foreign bodies. IMPRESSION: No acute osseous abnormality of the left femur. Electronically Signed   By: Ree Molt M.D.   On: 06/24/2024 10:51   Recent Labs    06/25/24 0520  WBC 2.3*  HGB 10.0*  HCT 30.7*  PLT 218    Recent Labs    06/25/24 0520  NA 140  K 4.3  CL 110  CO2 22  GLUCOSE 87  BUN 16  CREATININE 0.83  CALCIUM  7.9*     Intake/Output Summary (Last 24 hours) at 06/25/2024 1315 Last data filed at 06/25/2024 0900 Gross per 24 hour  Intake 240 ml  Output --  Net 240 ml        Physical Exam: Vital Signs Blood pressure (!) 117/58, pulse 60, temperature 98.1 F (36.7 C), temperature source Oral, resp. rate 18, height 5' (1.524 m), weight 59.9 kg, SpO2 99%.        General: awake, alert, sitting in wheelchair, no acute distress.  Wearing TLSO HENT: conjugate gaze; oropharynx moist CV: regular rate and rhythm; no JVD Pulmonary: CTA B/L; no W/R/R- good air movement GI: soft, NT, ND, (+)BS Psychiatric: appropriate- extremely  interactive- says feels Blah- appears depressed Neurological: Ox3    Comments: LLE sutures look good- healing well- no drainage- still has purple bruising down entire L lateral thigh Neurologic: Cranial nerves II through XII intact, motor strength is 5/5 in bilateral deltoid, bicep, tricep, grip, 4-/5 RIght and 3-/5 Left hip flexor, knee extensors, ankle dorsiflexor and plantar flexor Sensory exam normal sensation to light touch  in bilateral upper and lower extremities Cerebellar exam normal finger to nose to finger  in bilateral upper extremities  Prior neuro assessment is c/w today's exam 06/25/2024.  Assessment/Plan: 1. Functional deficits which require 3+ hours per day of interdisciplinary therapy in a comprehensive inpatient rehab setting. Physiatrist is providing close team supervision and 24 hour management of active medical problems listed below. Physiatrist and rehab team continue to assess barriers to discharge/monitor patient progress toward functional and medical goals  Care Tool:  Bathing    Body parts bathed by patient: Right arm, Left arm, Chest, Abdomen, Right upper leg, Left upper leg, Face   Body parts bathed by helper: Front perineal area, Buttocks, Right lower leg, Left lower leg     Bathing assist Assist Level: Moderate  Assistance - Patient 50 - 74%     Upper Body Dressing/Undressing Upper body dressing   What is the patient wearing?: Pull over shirt, Orthosis    Upper body assist Assist Level: Supervision/Verbal cueing    Lower Body Dressing/Undressing Lower body dressing      What is the patient wearing?: Pants     Lower body assist Assist for lower body dressing: Minimal Assistance - Patient > 75%     Toileting Toileting Toileting Activity did not occur (Clothing management and hygiene only):  (void)  Toileting assist Assist for toileting: Minimal Assistance - Patient > 75% Assistive Device Comment: RW   Transfers Chair/bed  transfer  Transfers assist     Chair/bed transfer assist level: Supervision/Verbal cueing     Locomotion Ambulation   Ambulation assist      Assist level: Contact Guard/Touching assist Assistive device: Walker-rolling Max distance: 220'   Walk 10 feet activity   Assist     Assist level: Contact Guard/Touching assist Assistive device: Walker-rolling   Walk 50 feet activity   Assist Walk 50 feet with 2 turns activity did not occur: Safety/medical concerns (pain, weakness)  Assist level: Contact Guard/Touching assist Assistive device: Walker-rolling    Walk 150 feet activity   Assist Walk 150 feet activity did not occur: Safety/medical concerns (pain, weakness)  Assist level: Contact Guard/Touching assist Assistive device: Walker-rolling    Walk 10 feet on uneven surface  activity   Assist Walk 10 feet on uneven surfaces activity did not occur: Safety/medical concerns (pain, weakness)         Wheelchair     Assist Is the patient using a wheelchair?: No Type of Wheelchair: Manual Wheelchair activity did not occur: Safety/medical concerns (pain, weakness, TTWB LLE)         Wheelchair 50 feet with 2 turns activity    Assist    Wheelchair 50 feet with 2 turns activity did not occur: Safety/medical concerns (pain, weakness, TTWB LLE)       Wheelchair 150 feet activity     Assist  Wheelchair 150 feet activity did not occur: Safety/medical concerns (pain, weakness, TTWB LLE)       Blood pressure (!) 117/58, pulse 60, temperature 98.1 F (36.7 C), temperature source Oral, resp. rate 18, height 5' (1.524 m), weight 59.9 kg, SpO2 99%.    Medical Problem List and Plan: 1. Functional deficits secondary to Polytrauma             -patient may not shower? Will check with OT- might be able to wrap over the brace and still shower             -ELOS/Goals: 7-10d  D/c date 06/29/24  Con't CIR PT sn d OT Assured pt will speak with husband  prior to d/c- will make sure she understands her TDWB is for 6-8 weeks on LLE- xray due- will order- also removing L thigh sutures per  time ot do so  - Team conference tomorrow 2.  Antithrombotics: -DVT/anticoagulation:  Pharmaceutical: Lovenox              -antiplatelet therapy: N/A 3. Pain Management:  Hydrocodone  10 mg prn severe pain and 5 mg prn moderate pain. Premedicate prior to therapy--has a lot of anxiety regarding activity tolerance/pain.              --continues to have muscle spasms worse when in chair. Continue robaxin  750 mg tid. May need baclofen  additionally. 7/29- pt received scheduled pain meds this  AM- but not prns- will d/w nursing 7/30- changed Norco to say can give within 30 minutes of scheduled Norco  7/31- will increase Robaxin  to 1000 mg 4x/day.  8/2-8/3 pain moderately better but still rates as 7-8/10 8/4 pain 7 out of 10 today, has not had as needed medications today yet, continue current regimen 4. Mood/Behavior/Sleep: LCSW to follow for evaluation and support.              -antipsychotic agents: N/A. Team to provide ego support.             --melatonin prn for insomnia.   7/29- will add Buspar  5 mg TID for anxiety  8/2- Melatonin now scheduled   Patient reports sleeping much better with melatonin 5. Neuropsych/cognition: This patient is capable of making decisions on her own behalf. 6. Skin/Wound Care: Routine pressure relief measures.   8/2- wrote to remove IV  8/3- wrote to remove her Sutures on L thigh 7. Fluids/Electrolytes/Nutrition: Monitor I/O. Check CMET in am 8. T12 Chance Fx s/p fixation: Wear TLSO when out of bed 9. Displaced femur fx s/p ORIF: TTWB with follow up X rays in 2 weeks 10. H/o Multifactorial Neuropathy: Continue gabapentin  BID.  Has chronic L2-3 stenosis which has worsened over time.   11.  H/o morbid Obesity s/p duodenal switch surgery: NO longer on meds for HTN, DM  and does not have sleep apnea. Pt reports 150lb weight loss   --Reports has not been able to afford vitamins--discussed generic --options. --Resume copper . Change Vit C to B complex w/C.  7/30- pt was concerned wasn't getting vitamins- compared to acute and is.  12. H/o dumping syndrome:  Has had bowel and bladder incontinence since DSS. 7/29- questionable urinary retention now and feeling like cannot empty bowels as well- could have neurogenic bowel and bladder- will order bladder scans  q6 hours scheduled 7/30- no increased PVRs right now so far and no BM yesterday- will restart Senna- not colace, since causes looser stools,  7/31- LBM x2 this AM- on toilet for at least 1x- urinating- not checking PVRs/bladder scans that can see in chart. Had a bladder scan of 46cc yesterday- and Zero PVR x1.   8/3- LBM yesterday 8/4 LBM last night 13. H/o spinal stenosis: Multiple surgery with RLE weakness 14. Thrombocytopenia: Pancytopenia in the past felt to be due to nutritional/copper  deficiency.              --Platelets recovering from 85 to 118  7/29- plts up to 203k 15. Acute blood loss anemia: Recheck hgb in am  7/29- Hb stable at 9.5- but MCV 102- increasing  since admission- started on Vit B complex with Vit C- will con't to monitor trend since might need B12 injections.   8/4 stable at 10.0 today 16. Neutropenia: WBC- 6.5-->3.9.  Recheck in am.   7/29- is followed by Hematology for Neutropenia- Latest appt was ~ 1 month ago- WBC stable at 3.9  7/31- WBC down slightly to 3.4- will check 2x/week  8/4 WBC lower today, will add diff, Discussed with her oncologist-Appreciate assistance.  Likely due to cirrhosis- continue to monitor. Initiate Infectious workup if she develops fever or other indications of infection 17. Vitamin D  deficiency- 19.97: Change cholecalciferol  to ergocalciferol  added.   18. COPD: Hx of cough/second hand smoke exposure. Continue Breztri .  19. Hypotension: Will monitor weights daily. TEDs for edema control.  --D/c lasix . TEDs for edema  control LLE. May need binder if orthostatic.  7/29- will  see if needs to add midodrine.  8/1- haven't heard from therapy needs to add midodrine    06/25/2024    5:11 AM 06/25/2024    5:00 AM 06/24/2024    8:37 PM  Vitals with BMI  Weight  132 lbs 1 oz   BMI  25.79   Systolic 117  131  Diastolic 58  62  Pulse 60  58    20. Hypothyroid: Continue supplement.   21. Hypocalcemia  7/29- Ca is 7.2- but albumin  is 2.5- will check Ionized Calcium  tomorrow to make sure if needs to replete, esp with Duodenal switch surgery.   7/30- is pending. 7/31- iCa good at 4.7-and Ca up to 7.8 on BMP today  8/4 calcium  stable at 7.9 22. CBG's  8/2- will stop CBGs since they have all been above 75 for last 5 days- lowest 78.  LOS: 7 days A FACE TO FACE EVALUATION WAS PERFORMED  Murray Collier 06/25/2024, 1:15 PM

## 2024-06-25 NOTE — Progress Notes (Signed)
 Lt leg/thigh sutures removed per order. Patient tolerated well. Steri strips applied.

## 2024-06-25 NOTE — Progress Notes (Signed)
 Physical Therapy Session Note  Patient Details  Name: Kimberly [name] MRN: [id number] Date of Birth: May 29, 1956  Today's Date: 06/25/2024 PT Individual Time: 0830-0930 PT Individual Time Calculation (min): 60 min   Short Term Goals: Week 1:  PT Short Term Goal 1 (Week 1): = LTG  Skilled Therapeutic Interventions/Progress Updates:    Pt in bed when PT arrived.  Pt agreeable to session.  Pt performed supine bed ex's: KTC with yellow med ball x30, HS isometrics with yellow med ball 3x10, SAQ 3x10, right sidelying clams 2x10, gentle L/S oscillations 3x30.  Side lying to sit at EOB with Supervision. Pt able to donn TLSO with set up with good trunk control.  Pt ambulated from room to day room with RW CGA with x1 rest break.  Pt performed seated mat ex's for LE: LAQ, marching, isom add with towel x3 holds; 2x10 each. Pt amb back to room with RW no breaks, stayed up in w/c with LE's elevated, all items within reach.   Therapy Documentation Precautions:  Precautions Precautions: Back Precaution Booklet Issued: Yes (comment) Recall of Precautions/Restrictions: Intact Precaution/Restrictions Comments: TTWB LLE, TSLO OOB, NO BLT; pt able to recall Required Braces or Orthoses: Spinal Brace Spinal Brace: Thoracolumbosacral orthotic, Applied in sitting position Restrictions Weight Bearing Restrictions Per Provider Order: Yes LLE Weight Bearing Per Provider Order: Touchdown weight bearing Other Position/Activity Restrictions: TTWB LLE    Pain: 6/10, LB, left leg      Therapy/Group: Individual Therapy  [name] Fast 06/25/2024, 12:26 PM

## 2024-06-26 NOTE — Progress Notes (Signed)
 Physical Therapy Session Note  Patient Details  Name: Kimberly [name] MRN: [id number] Date of Birth: 1956-03-03  Today's Date: 06/26/2024 PT Individual Time: 0930-1030 PT Individual Time Calculation (min): 60 min   Short Term Goals: Week 1:  PT Short Term Goal 1 (Week 1): = LTG Week 2:     Skilled Therapeutic Interventions/Progress Updates:    Pt in bed when PT arrived, pt agreeable for PT session. Student observer present with pt's approval.  Pt able to transfer self from supine to EOB, donn TLSO brace, transfer from EOB to w/c using RW with supervision, keeping with TTWB LLE.  Pt reports 7/10 pain to LLE and LB. Pt wheeled to gym.  Mat exercises in supine performed including: LLE SLR with assist for eccentric control, hip abd/add, knee bends on transfer board to decrease friction, HL hip abd with/without red TB, KTC; x10-15 reps. STM to left plantar fascia.  Mat uncomfortable for patient, wheeled back to room to perform right side lying clams 2x15, isometric add with pillow between knees x20.  Pt able to tolerate treatment without increasing level of pain. Pt requested to stay in bed until lunch; all items within reach.   Therapy Documentation Precautions:  Precautions Precautions: Back Precaution Booklet Issued: Yes (comment) Recall of Precautions/Restrictions: Intact Precaution/Restrictions Comments: TTWB LLE, TSLO OOB, NO BLT; pt able to recall Required Braces or Orthoses: Spinal Brace Spinal Brace: Thoracolumbosacral orthotic, Applied in sitting position Restrictions Weight Bearing Restrictions Per Provider Order: Yes LLE Weight Bearing Per Provider Order: Touchdown weight bearing Other Position/Activity Restrictions: TTWB LLE    Pain: Pain Assessment Pain Scale: 0-10 Pain Score: 7  Pain Location: Back Pain Intervention(s): Medication (See eMAR)     Therapy/Group: Individual Therapy  Kimberly [name] Fast 06/26/2024, 12:41 PM

## 2024-06-26 NOTE — Progress Notes (Signed)
 Physical Therapy Session Note  Patient Details  Name: Kimberly [name] MRN: [id number] Date of Birth: November 14, 1956  Today's Date: 06/26/2024 PT Individual Time: 0800-0828 PT Individual Time Calculation (min): 28 min   Short Term Goals: Week 1:  PT Short Term Goal 1 (Week 1): = LTG  Skilled Therapeutic Interventions/Progress Updates:      Pt supine in bed upon arrival. Pt agreeable to therapy. Pt reports 7/10 pain in L LE and back pain, premedicated. Therapist provided rest breaks and repositioning as needed.   PT offered to donn ted hose with total A while supine in bed however pt refusing at this time. Pt reports increased pain with ted hose, and inability to tolerate it. Pt reports she hasn't worn them in the past couple of days.   Pt able to provide teach back of spinal precuations and L LE TTWBing precautions.   Supine to sit with use of log roll technique and supervision. Donned TLSO while seated EOB with set up assist.   Pt performed sit to stand with supervision, pt ambulated with RW and close supervision/CGA with navigation of inclined theshold and performed ambulatory transfer to [facility] over toilet with RW and close supervision, pt demonstrates good adherence with L LE TTWBing precautions. Pt continent of urine. Pt performed pericare while seated with supervision.  Pt opting to return to bed versus sitting up in [facility] or recliner between sessions 2/2 pt reporting back spasms when up for long periods. Pt performed sit to supine to flat bed with increased time and supervision.   Pt supine in bed with all needs within reach and bed alarm on.     Therapy Documentation Precautions:  Precautions Precautions: Back Precaution Booklet Issued: Yes (comment) Recall of Precautions/Restrictions: Intact Precaution/Restrictions Comments: TTWB LLE, TSLO OOB, NO BLT; pt able to recall Required Braces or Orthoses: Spinal Brace Spinal Brace: Thoracolumbosacral orthotic, Applied in sitting  position Restrictions Weight Bearing Restrictions Per Provider Order: Yes LLE Weight Bearing Per Provider Order: Touchdown weight bearing Other Position/Activity Restrictions: TTWB LLE   Therapy/Group: Individual Therapy  [facility] [name], [place], DPT  06/26/2024, 7:49 AM

## 2024-06-26 NOTE — Progress Notes (Signed)
 Patient ID: Kimberly [name], female   DOB: [birth year], 68 y.o.   MRN: [id number] Met with pt to give her team conference with progress toward her goals of mod/I level and discharge 8/8. Discussed having sister or husband come in for family training and she reports they are having car issues and does not think anyone can be here before Friday. Have sent a message to therapy team and MD they may need to call husband to go over pt's levels. Pt can direct her own care and can tell also. Made aware will be WBAT on Friday according to her ortho MD. Discussed home health she is agreeable but does not want [facility] feels did not do much for her after leaving [place] last year. Also wants a youth rolling walker, has ordered a bedside commode and tub seat. Will work on home health follow up

## 2024-06-26 NOTE — Patient Care Conference (Signed)
 Inpatient RehabilitationTeam Conference and Plan of Care Update Date: 06/26/2024   Time: 1144 am    Patient Name: Kimberly [name]      Medical Record Number: [id number]  Date of Birth: [birth year] Sex: Female         Room/Bed: 4W17C/4W17C-01 Payor Info: Payor: [employer] MEDICARE / Plan: [facility] MEDICARE / Product Type: *No Product type* /    Admit Date/Time:  06/18/2024  2:33 PM  Primary Diagnosis:  Thoracic spine fracture [facility])  Hospital Problems: Principal Problem:   Thoracic spine fracture [facility]) Active Problems:   Femur fracture, left [facility])    Expected Discharge Date: Expected Discharge Date: 06/29/24  Team Members Present: Physician leading conference: Dr. Duwaine Barrs Social Worker Present: Rhoda Clement, LCSW Nurse Present: Eulalio Falls, RN PT Present: Other (comment) Lessie Miso, PT) OT Present: Camie Hoe, OT     Current Status/Progress Goal Weekly Team Focus  Bowel/Bladder   continent of bowel and bladder   remain continent   attend to patient's bowel and bladder needs accordingly    Swallow/Nutrition/ Hydration               ADL's   CGA-SBA LB ADLs and transfers, SBA UB ADLs and donning/doffing TLSO, good adherence to WB and back precautions   mod I   LB ADL retraining, pain management, D/C planning    Mobility   CGA/S for transfers, gait with RW.   mod indep with transfers, gait with RW  endurance, strengthening, pain management, pt education    Communication                Safety/Cognition/ Behavioral Observations               Pain   leg pain   free form pain   pain management and dee breathing exercises    Skin   clean and dry  Leg incision /back incision OTA Bruising to leg free from skin infection  turning and positioning every 2 hours      Discharge Planning:  HOme with husband who is able to assist, will have come in for education and work on DME and follow up    Team Discussion: Patient admitted post  polytrauma after a fall at home s/p ORIF LLE. Patient progress limited by weight bearing restrictions, pain. Patient will be WBAT on Friday according to Ortho MD  Patient on target to meet rehab goals: yes, currently patient needs stand by assist with upper body care and CGA-SBA with lower body care. Patient requires CGA with transfers and ambulates using a rolling walker. Overall goals at discharge are set for mod I assist. .   *See Care Plan and progress notes for long and short-term goals.   Revisions to Treatment Plan:  TLSO brace TDWB LLE   Teaching Needs: Safety, medications, weight bearing restrictions, TLSO brace donning on  and off , transfers, toileting, back precautions, etc.    Current Barriers to Discharge: Decreased caregiver support, Home enviroment access/layout, and Weight bearing restrictions  Possible Resolutions to Barriers: Family Education Home Health follow-up DME: RW, BSC, Tub seat     Medical Summary Current Status: sutures out-  Barriers to Discharge: Self-care education;Weight bearing restrictions;Complicated Wound;Behavior/Mood;Medical stability;Uncontrolled Pain;Incontinence  Barriers to Discharge Comments: limited by TDWB on LLE- chronic with acute pain; macular degeneration; bladder accidents; TLSO- and back precautions; Possible Resolutions to [employer]: on track tomeet goals- spoke with Ortho - WBAT as of 8/8 and OK for WBC of 2.8- due  to cirrhosis per Hematology- d/c 8/8   Continued Need for Acute Rehabilitation Level of Care: The patient requires daily medical management by a physician with specialized training in physical medicine and rehabilitation for the following reasons: Direction of a multidisciplinary physical rehabilitation program to maximize functional independence : Yes Medical management of patient stability for increased activity during participation in an intensive rehabilitation regime.: Yes Analysis of laboratory values  and/or radiology reports with any subsequent need for medication adjustment and/or medical intervention. : Yes   I attest that I was present, lead the team conference, and concur with the assessment and plan of the team.   Zari Cly Gayo 06/26/2024, 1144 am

## 2024-06-26 NOTE — Plan of Care (Signed)
  Problem: RH BOWEL ELIMINATION Goal: RH STG MANAGE BOWEL WITH ASSISTANCE Description: STG Manage Bowel with supervision Assistance. Outcome: Progressing   Problem: RH BLADDER ELIMINATION Goal: RH STG MANAGE BLADDER WITH ASSISTANCE Description: STG Manage Bladder With supervision Assistance Outcome: Progressing   Problem: RH SKIN INTEGRITY Goal: RH STG SKIN FREE OF INFECTION/BREAKDOWN Description: Manage skin free of infection/breakdown with supervision assistance Outcome: Progressing   Problem: RH SAFETY Goal: RH STG ADHERE TO SAFETY PRECAUTIONS W/ASSISTANCE/DEVICE Description: STG Adhere to Safety Precautions With supervision  Assistance/Device. Outcome: Progressing   Problem: RH PAIN MANAGEMENT Goal: RH STG PAIN MANAGED AT OR BELOW PT'S PAIN GOAL Description: <4 w/ prns Outcome: Progressing   Problem: RH KNOWLEDGE DEFICIT GENERAL Goal: RH STG INCREASE KNOWLEDGE OF SELF CARE AFTER HOSPITALIZATION Description: Manage increase knowledge of self care after hospitalization with supervision assistance from spouse using educational materials provided Outcome: Progressing

## 2024-06-26 NOTE — Progress Notes (Addendum)
 PROGRESS NOTE   Subjective/Complaints:  Pt reports LLE throbbing, jerking pain.  Took meds a few minutes ago- hurt all night- used ice packs.  Also having pain in L foot as well.  Pain traveling.  Toes funky- squeezing B/L foot pain.   Fewer bowel accidents- if needs to pee, has no control, but can control bowels more.    LBM this AM- and then 2-3x yesterday.   WBC 2.8-was told it's OK per Hematology. Unless gets infection   ROS:  Per HPI   Pt denies SOB, abd pain, CP, N/V/C/D, and vision changes    Objective:   DG FEMUR MIN 2 VIEWS LEFT Result Date: 06/24/2024 CLINICAL DATA:  [id number] Pain of left femur [id number] EXAM: LEFT FEMUR 2 VIEWS COMPARISON:  06/08/2024. FINDINGS: Redemonstration of patient's pre-existing left femur intramedullary nail with 2 proximal and distal fixation screws. No significant interval change in alignment when compared to the prior exam. No other acute fracture or dislocation. No aggressive osseous lesion. Mild-to-moderate degenerative changes of left hip and knee joints again seen. No focal soft tissue swelling. No radiopaque foreign bodies. IMPRESSION: No acute osseous abnormality of the left femur. Electronically Signed   By: Ree Molt M.D.   On: 06/24/2024 10:51   Recent Labs    06/25/24 0520 06/25/24 1428  WBC 2.3* 2.8*  HGB 10.0* 9.7*  HCT 30.7* 29.6*  PLT 218 215    Recent Labs    06/25/24 0520  NA 140  K 4.3  CL 110  CO2 22  GLUCOSE 87  BUN 16  CREATININE 0.83  CALCIUM  7.9*     Intake/Output Summary (Last 24 hours) at 06/26/2024 0944 Last data filed at 06/25/2024 1822 Gross per 24 hour  Intake 240 ml  Output --  Net 240 ml        Physical Exam: Vital Signs Blood pressure 116/67, pulse 72, temperature 98.7 F (37.1 C), temperature source Oral, resp. rate 18, height 5' (1.524 m), weight 58.2 kg, SpO2 99%.         General: awake, alert, appropriate,  supine in bed; NAD HENT: conjugate gaze; oropharynx moist- missing 2 front teeth CV: regular rate and rhythm; no JVD Pulmonary: CTA B/L; no W/R/R- good air movement GI: soft, NT, ND, (+)BS Psychiatric: very interactive- Neurological: Ox3    Comments: LLE sutures look good- healing well- no drainage- still has purple bruising down entire L lateral thigh Neurologic: Cranial nerves II through XII intact, motor strength is 5/5 in bilateral deltoid, bicep, tricep, grip, 4-/5 RIght and 3-/5 Left hip flexor, knee extensors, ankle dorsiflexor and plantar flexor Sensory exam normal sensation to light touch  in bilateral upper and lower extremities Cerebellar exam normal finger to nose to finger  in bilateral upper extremities  Prior neuro assessment is c/w today's exam 06/26/2024.  Assessment/Plan: 1. Functional deficits which require 3+ hours per day of interdisciplinary therapy in a comprehensive inpatient rehab setting. Physiatrist is providing close team supervision and 24 hour management of active medical problems listed below. Physiatrist and rehab team continue to assess barriers to discharge/monitor patient progress toward functional and medical goals  Care Tool:  Bathing    Body  parts bathed by patient: Right arm, Left arm, Chest, Abdomen, Right upper leg, Left upper leg, Face   Body parts bathed by helper: Front perineal area, Buttocks, Right lower leg, Left lower leg     Bathing assist Assist Level: Moderate Assistance - Patient 50 - 74%     Upper Body Dressing/Undressing Upper body dressing   What is the patient wearing?: Pull over shirt, Orthosis    Upper body assist Assist Level: Supervision/Verbal cueing    Lower Body Dressing/Undressing Lower body dressing      What is the patient wearing?: Pants     Lower body assist Assist for lower body dressing: Minimal Assistance - Patient > 75%     Toileting Toileting Toileting Activity did not occur (Clothing management  and hygiene only):  (void)  Toileting assist Assist for toileting: Minimal Assistance - Patient > 75% Assistive Device Comment: RW   Transfers Chair/bed transfer  Transfers assist     Chair/bed transfer assist level: Supervision/Verbal cueing     Locomotion Ambulation   Ambulation assist      Assist level: Contact Guard/Touching assist Assistive device: Walker-rolling Max distance: 220'   Walk 10 feet activity   Assist     Assist level: Contact Guard/Touching assist Assistive device: Walker-rolling   Walk 50 feet activity   Assist Walk 50 feet with 2 turns activity did not occur: Safety/medical concerns (pain, weakness)  Assist level: Contact Guard/Touching assist Assistive device: Walker-rolling    Walk 150 feet activity   Assist Walk 150 feet activity did not occur: Safety/medical concerns (pain, weakness)  Assist level: Contact Guard/Touching assist Assistive device: Walker-rolling    Walk 10 feet on uneven surface  activity   Assist Walk 10 feet on uneven surfaces activity did not occur: Safety/medical concerns (pain, weakness)         Wheelchair     Assist Is the patient using a wheelchair?: No Type of Wheelchair: Manual Wheelchair activity did not occur: Safety/medical concerns (pain, weakness, TTWB LLE)         Wheelchair 50 feet with 2 turns activity    Assist    Wheelchair 50 feet with 2 turns activity did not occur: Safety/medical concerns (pain, weakness, TTWB LLE)       Wheelchair 150 feet activity     Assist  Wheelchair 150 feet activity did not occur: Safety/medical concerns (pain, weakness, TTWB LLE)       Blood pressure 116/67, pulse 72, temperature 98.7 F (37.1 C), temperature source Oral, resp. rate 18, height 5' (1.524 m), weight 58.2 kg, SpO2 99%.    Medical Problem List and Plan: 1. Functional deficits secondary to Polytrauma             -patient may not shower? Will check with OT- might be  able to wrap over the brace and still shower             -ELOS/Goals: 7-10d  D/c date 06/29/24 Assured pt will speak with husband prior to d/c- will make sure she understands her TDWB is for 6-8 weeks on LLE- xray due- will order- also removing L thigh sutures per  time ot do so  Con't CIR PT and OT  Assured pt again will d/w husband about precautions  Also team conference today to finalize d/c  Went over labs with pt in depth- spoke to MD about hematology and WBC.  2.  Antithrombotics: -DVT/anticoagulation:  Pharmaceutical: Lovenox              -  antiplatelet therapy: N/A 3. Pain Management:  Hydrocodone  10 mg prn severe pain and 5 mg prn moderate pain. Premedicate prior to therapy--has a lot of anxiety regarding activity tolerance/pain.              --continues to have muscle spasms worse when in chair. Continue robaxin  750 mg tid. May need baclofen  additionally. 7/29- pt received scheduled pain meds this AM- but not prns- will d/w nursing 7/30- changed Norco to say can give within 30 minutes of scheduled Norco  7/31- will increase Robaxin  to 1000 mg 4x/day.  8/2-8/3 pain moderately better but still rates as 7-8/10 8/4 pain 7 out of 10 today, has not had as needed medications today yet, continue current regimen  8/5- Pain high but just got meds 2-4 minutes ago 4. Mood/Behavior/Sleep: LCSW to follow for evaluation and support.              -antipsychotic agents: N/A. Team to provide ego support.             --melatonin prn for insomnia.   7/29- will add Buspar  5 mg TID for anxiety  8/2- Melatonin now scheduled   Patient reports sleeping much better with melatonin 5. Neuropsych/cognition: This patient is capable of making decisions on her own behalf. 6. Skin/Wound Care: Routine pressure relief measures.   8/2- wrote to remove IV  8/3- wrote to remove her Sutures on L thigh 7. Fluids/Electrolytes/Nutrition: Monitor I/O. Check CMET in am 8. T12 Chance Fx s/p fixation: Wear TLSO when out of  bed 9. Displaced femur fx s/p ORIF: TTWB with follow up X rays in 2 weeks  8/5- Xray looks OK-  10. H/o Multifactorial Neuropathy: Continue gabapentin  BID.  Has chronic L2-3 stenosis which has worsened over time.   11.  H/o morbid Obesity s/p duodenal switch surgery: NO longer on meds for HTN, DM  and does not have sleep apnea. Pt reports 150lb weight loss  --Reports has not been able to afford vitamins--discussed generic --options. --Resume copper . Change Vit C to B complex w/C.  7/30- pt was concerned wasn't getting vitamins- compared to acute and is.  12. H/o dumping syndrome:  Has had bowel and bladder incontinence since DSS. 7/29- questionable urinary retention now and feeling like cannot empty bowels as well- could have neurogenic bowel and bladder- will order bladder scans  q6 hours scheduled 7/30- no increased PVRs right now so far and no BM yesterday- will restart Senna- not colace, since causes looser stools,  7/31- LBM x2 this AM- on toilet for at least 1x- urinating- not checking PVRs/bladder scans that can see in chart. Had a bladder scan of 46cc yesterday- and Zero PVR x1.   8/3- LBM yesterday 8/4 LBM last night 13. H/o spinal stenosis: Multiple surgery with RLE weakness 14. Thrombocytopenia: Pancytopenia in the past felt to be due to nutritional/copper  deficiency.              --Platelets recovering from 85 to 118  7/29- plts up to 203k 15. Acute blood loss anemia: Recheck hgb in am  7/29- Hb stable at 9.5- but MCV 102- increasing  since admission- started on Vit B complex with Vit C- will con't to monitor trend since might need B12 injections.   8/4 stable at 10.0 today 16. Neutropenia: WBC- 6.5-->3.9.  Recheck in am.   7/29- is followed by Hematology for Neutropenia- Latest appt was ~ 1 month ago- WBC stable at 3.9  7/31- WBC down slightly to 3.4- will  check 2x/week  8/4 WBC lower today, will add diff, Discussed with her oncologist-Appreciate assistance.  Likely due to  cirrhosis- continue to monitor. Initiate Infectious workup if she develops fever or other indications of infection 17. Vitamin D  deficiency- 19.97: Change cholecalciferol  to ergocalciferol  added.   18. COPD: Hx of cough/second hand smoke exposure. Continue Breztri .  19. Hypotension: Will monitor weights daily. TEDs for edema control.  --D/c lasix . TEDs for edema control LLE. May need binder if orthostatic.  7/29- will see if needs to add midodrine.  8/1- haven't heard from therapy needs to add midodrine 8/5- looking better now    06/26/2024    5:55 AM 06/26/2024    4:42 AM 06/25/2024    7:46 PM  Vitals with BMI  Weight 128 lbs 5 oz    BMI 25.06    Systolic  116 105  Diastolic  67 55  Pulse  72 65    20. Hypothyroid: Continue supplement.   21. Hypocalcemia  7/29- Ca is 7.2- but albumin  is 2.5- will check Ionized Calcium  tomorrow to make sure if needs to replete, esp with Duodenal switch surgery.   7/30- is pending. 7/31- iCa good at 4.7-and Ca up to 7.8 on BMP today  8/4 calcium  stable at 7.9 22. CBG's  8/2- will stop CBGs since they have all been above 75 for last 5 days- lowest 78.    I spent a total of 53   minutes on total care today- >50% coordination of care- due to  [facility] over with pt about her labs at length- went over with other MD- as well as d/w nursing and team conference as well as d/w therapy. Also d/w ortho- can start WBAT on Friday.   Per [facility] 06/26/24 I would just keep reminding them that just because she is WBAT does not mean we expect her to be walking normally. I always tell pts I think of the fracture healing process in 2 phases: the first 6-8 weeks to get the fracture to heal and the next 6-8 weeks to get the strength back. given her back as well I wouldn't see her getting off a walker or crutches for at least 6-8 weeks   Addendum- spoke to husband, d/w him it will take 6-8 weeks to come off the RW- and up to another 6-8 weeks until she is back to baseline  walking- she will be in TSLO- back brace for a minimum of 6 weeks, but NSU will determine at f/u.   LOS: 8 days A FACE TO FACE EVALUATION WAS PERFORMED  Herlinda Heady 06/26/2024, 9:44 AM

## 2024-06-26 NOTE — Progress Notes (Signed)
 Occupational Therapy Session Note  Patient Details  Name: Kimberly [name] MRN: [id number] Date of Birth: 1956/01/26  Today's Date: 06/26/2024 OT Individual Time: 8691-8574 OT Individual Time Calculation (min): 77 min    Short Term Goals: Week 1:  OT Short Term Goal 1 (Week 1): Pt will complete toileting at CGA with LRAD OT Short Term Goal 2 (Week 1): Pt will complete LB dressing at CGA with AE as necessary OT Short Term Goal 3 (Week 1): Pt will complete transfera at [facility] with LRAD  Skilled Therapeutic Interventions/Progress Updates:      Therapy Documentation Precautions:  Precautions Precautions: Back Precaution Booklet Issued: Yes (comment) Recall of Precautions/Restrictions: Intact Precaution/Restrictions Comments: TTWB LLE, TSLO OOB, NO BLT; pt able to recall Required Braces or Orthoses: Spinal Brace Spinal Brace: Thoracolumbosacral orthotic, Applied in sitting position Restrictions Weight Bearing Restrictions Per Provider Order: Yes LLE Weight Bearing Per Provider Order: Touchdown weight bearing Other Position/Activity Restrictions: TTWB LLE General: Pt supine in bed upon OT arrival, agreeable to OT session.   Pain:   7/10 pain reported in low back and Lt leg where bruising is from surgery, activity, intermittent rest breaks, distractions provided for pain management, pt reports tolerable to proceed. OT providing ice at end of session for pain and inflammation management  ADL: OT providing skilled intervention on ADL retraining in order to increase independence with tasks and increase activity tolerance. Pt completed the following tasks at the current level of assist: Bed mobility: SBA from flat bed using log roll technique, supine><EOB UB dressing: donned TLSO seated EOB with SBA and open front jacket at SBA with good trunk balance without back support Footwear: total A for tennis shoes seated EOB Transfers: SBA with RW EOB><W/C  Exercises: Pt completed the following  exercise circuit in order to improve functional activity, strength and endurance to prepare for ADLs such as bathing. Pt completed the following exercises in seated position with no noted LOB/SOB and 3x10 repetitions on each exercise with PRN rest breaks: -bicep curls -triceps extensions -shoulder abduction -forward punches -External rotation   Pt supine in bed with bed alarm activated, 2 bed rails up, call light within reach and 4Ps assessed. Handoff to NT for vitals      Therapy/Group: Individual Therapy  Camie Hoe, OTD, OTR/L 06/26/2024, 2:34 PM

## 2024-06-27 MED ORDER — LIDOCAINE 5 % EX PTCH
2.0000 | MEDICATED_PATCH | CUTANEOUS | Status: DC
  Administered 2024-06-27 – 2024-06-28 (×2): 2 via TRANSDERMAL
  Filled 2024-06-27 (×2): qty 2

## 2024-06-27 MED ORDER — CYCLOBENZAPRINE HCL 10 MG PO TABS
10.0000 mg | ORAL_TABLET | Freq: Three times a day (TID) | ORAL | Status: DC
  Administered 2024-06-27 – 2024-06-29 (×8): 10 mg via ORAL
  Filled 2024-06-27 (×8): qty 1

## 2024-06-27 NOTE — Progress Notes (Signed)
 Patient ID: Kimberly [name], female   DOB: 04-01-1956, 68 y.o.   MRN: [id number] Have gotten [name] to accept pt's home health referral to provide PT & OT at home upon discharge.

## 2024-06-27 NOTE — Progress Notes (Signed)
 Occupational Therapy Discharge Summary  Patient Details  Name: Kimberly [name] MRN: [id number] Date of Birth: 09/01/1956  Date of Discharge from OT service:June 28, 2024  {CHL IP REHAB OT TIME CALCULATIONS:[id number]}   Patient has met 9 of 9 long term goals due to improved activity tolerance, improved balance, postural control, and ability to compensate for deficits.  Patient to discharge at overall Modified Independent level.  Patient's care partner is independent to provide the necessary physical assistance at discharge.    Reasons goals not met: All goals met  Recommendation:  Patient will benefit from ongoing skilled OT services in outpatient setting to continue to advance functional skills in the area of BADL, iADL, and Reduce care partner burden.  Equipment: Pt privately purchasing TTB for when cleared to shower   Reasons for discharge: treatment goals met and discharge from hospital  Patient/family agrees with progress made and goals achieved: Yes  OT Discharge Precautions/Restrictions  Precautions Precautions: Back;Fall Recall of Precautions/Restrictions: Intact Precaution/Restrictions Comments: TTWB LLE, TSLO OOB, NO BLT; pt able to recall Required Braces or Orthoses: Spinal Brace Spinal Brace: Thoracolumbosacral orthotic;Applied in sitting position Restrictions Weight Bearing Restrictions Per Provider Order: Yes LLE Weight Bearing Per Provider Order: Touchdown weight bearing Other Position/Activity Restrictions: TTWB LLE ADL ADL Equipment Provided: Reacher, Long-handled sponge Eating: Modified independent Where Assessed-Eating: Bed level, Chair, Wheelchair Grooming: Modified independent Where Assessed-Grooming: Sitting at sink, Standing at sink Upper Body Bathing: Modified independent Where Assessed-Upper Body Bathing: Sitting at sink Lower Body Bathing: Modified independent Where Assessed-Lower Body Bathing: Sitting at sink, Standing at sink Upper Body  Dressing: Modified independent (Device) (including TLSO) Where Assessed-Upper Body Dressing: Edge of bed Lower Body Dressing: Modified independent Where Assessed-Lower Body Dressing: Edge of bed Toileting: Modified independent Where Assessed-Toileting: Toilet, Bedside Commode Toilet Transfer: Modified independent Statistician Method: Proofreader: Gaffer: Modified independent Web designer Method: Ship broker: Insurance underwriter: Modified independent Film/video editor Method: Designer, industrial/product: Emergency planning/management officer ADL Comments: pt able to follow TTWB during transfers and able to manage TLSO at EOB, able to state 3/3 back precautions Vision Baseline Vision/History: 6 Macular Degeneration;1 Wears glasses Patient Visual Report: No change from baseline Vision Assessment?: Wears glasses for reading Perception  Perception: Within Functional Limits Praxis Praxis: [facility] Cognition   Sensation   Motor  Motor Motor: Within Functional Limits Mobility  Bed Mobility Bed Mobility: Rolling Right;Rolling Left;Sit to Supine;Supine to Sit;Scooting to [facility] Rolling Right: Independent with assistive device Rolling Left: Independent with assistive device Supine to Sit: Independent with assistive device Sit to Supine: Independent with assistive device Scooting to [facility]: Independent with assistive device Transfers Sit to Stand: Independent with assistive device Stand to Sit: Independent with assistive device  Trunk/Postural Assessment  Cervical Assessment Cervical Assessment: Within Functional Limits Thoracic Assessment Thoracic Assessment: Within Functional Limits Lumbar Assessment Lumbar Assessment: Within Functional Limits Postural Control Postural Control: Within Functional Limits  Balance Balance Balance Assessed: Yes Static Sitting Balance Static Sitting -  Balance Support: Feet supported Static Sitting - Level of Assistance: 6: Modified independent (Device/Increase time) Dynamic Sitting Balance Dynamic Sitting - Balance Support: Feet supported Dynamic Sitting - Level of Assistance: 6: Modified independent (Device/Increase time) Static Standing Balance Static Standing - Balance Support: Bilateral upper extremity supported;During functional activity Static Standing - Level of Assistance: 6: Modified independent (Device/Increase time) Dynamic Standing Balance Dynamic Standing - Balance Support: During functional activity Dynamic Standing - Level of Assistance: 6:  Modified independent (Device/Increase time) Extremity/Trunk Assessment RUE Assessment RUE Assessment: Within Functional Limits Active Range of Motion (AROM) Comments: WFL General Strength Comments: 5/5 LUE Assessment LUE Assessment: Within Functional Limits Active Range of Motion (AROM) Comments: [facility] General Strength Comments: 5/5   Camie Hoe, OTD, OTR/L 06/27/2024, 8:16 PM

## 2024-06-27 NOTE — Discharge Instructions (Addendum)
 Inpatient Rehab Discharge Instructions  Kimberly [name] Discharge date and time:  06/29/24   Activities/Precautions/ Functional Status: Activity: no lifting, driving, or strenuous exercise till cleared by MD Diet: cardiac diet Wound Care: keep wound clean and dry   Functional status:  ___ No restrictions     ___ Walk up steps independently _X__ 24/7 supervision/assistance   ___ Walk up steps with assistance ___ Intermittent supervision/assistance  ___ Bathe/dress independently ___ Walk with walker     __X_ Bathe/dress with assistance ___ Walk Independently    ___ Shower independently ___ Walk with assistance    ___ Shower with assistance _X__ No alcohol      ___ Return to work/school ________   Special Instructions: Touch down weight on left leg only! Dr. Celena will let you know when you can start putting more weigh.  2.  Don back brace when sitting at edge of bed and before getting out of bed.  3. Protein supplements twice a day   COMMUNITY REFERRALS UPON DISCHARGE:    Home Health:   PT   &   OT                   Agency: [facility]    Phone: [phone]   Medical Equipment/Items Ordered:[name]                                                 Agency/Supplier:[facility]   [phone]   My questions have been answered and I understand these instructions. I will adhere to these goals and the provided educational materials after my discharge from the hospital.  Patient/Caregiver Signature _______________________________ Date __________  Clinician Signature _______________________________________ Date __________  Please bring this form and your medication list with you to all your follow-up doctor's appointments.

## 2024-06-27 NOTE — Progress Notes (Signed)
 Physical Therapy Weekly Progress Note  Patient Details  Name: Kimberly [name] MRN: [id number] Date of Birth: March 12, 1956  Beginning of progress report period: June 27, 2024 End of progress report period: June 27, 2024  Today's Date: 06/27/2024 PT Individual Time: 0830-0930 PT Individual Time Calculation (min): 60 min   Patient has met 5 of 10 short term goals.  Pt making good gains towards goals. Pt is mod indep with chair/bed transfers, dynamic/static balance, pt continues to require supervision for gait due to LLE TTWB status.   Patient continues to demonstrate the following deficits muscle weakness and muscle joint tightness and decreased cardiorespiratoy endurance and therefore will continue to benefit from skilled PT intervention to increase functional independence with mobility.  Patient progressing toward long term goals..  Continue plan of care.  PT Short Term Goals Week 1:  PT Short Term Goal 1 (Week 1): = LTG Week 2:     Skilled Therapeutic Interventions/Progress Updates:  Pt demonstrates much improved ambulation distance and activity tolerance using RW. Pt requiring supervision for functional mobility. Pt continues with LBP, LLE pain, however, does not interfere with daily functional activities and therapy.     Pt in bed when PT arrived, pt agreeable for session this morning. Pt reports she had a bad night, her back and left leg was hurting so much and could not get comfortable in bed.  Pt was able to sleep after taking mm relaxer and pain med. Pt reports 4-5/10 pain based on Wong-Baker face scale. Pt has mild edema to bilateral LE's, pt refuses TED hose stating too uncomfortable.  Pt agreeable for PT session.   Supine ex's: L SLR 5x10, bilat hip abd/add 5x10, R SLR with assist 3x15, HL hip abd with red tb 3x15, right side lying clams with 1.5# above knee 3x15, LTR x20, gentle oscillations in side lying for pain management/comfort.  Pt agreed to sit up in w/c until next  appointment.  Pt able to log roll to EOB using railing for support, donn TLSO brace with set up, transfer self from EOB to w/c using RW; mod indep. Pt up in w/c with all items within reach.  Discussed d/c planning with patient, HHPT, DME.   Therapy Documentation Precautions:  Precautions Precautions: Back Precaution Booklet Issued: Yes (comment) Recall of Precautions/Restrictions: Intact Precaution/Restrictions Comments: TTWB LLE, TSLO OOB, NO BLT; pt able to recall Required Braces or Orthoses: Spinal Brace Spinal Brace: Thoracolumbosacral orthotic, Applied in sitting position Restrictions Weight Bearing Restrictions Per Provider Order: Yes LLE Weight Bearing Per Provider Order: Touchdown weight bearing Other Position/Activity Restrictions: TTWB LLE    Pain: Pain Assessment Pain Scale: 0-10 Pain Score: 4-5/10; Wong-Baker Pain Location: Back Pain Intervention(s): Medication (See eMAR)    Therapy/Group: Individual Therapy  [name] Fast 06/27/2024, 11:19 AM

## 2024-06-27 NOTE — Progress Notes (Signed)
 PROGRESS NOTE   Subjective/Complaints:  Pain was rough last night- got up to bathroom and had severe spasms in  back that started- couldn't get comfortable for hours'_although finally got back to sleep.  Took robaxin  and Oxy and got positioned right by nurse.  L leg cramping and also hurting like crazy.  LBM yesterday  ROS:  Per HPI   Pt denies SOB, abd pain, CP, N/V/C/D, and vision changes    Objective:   No results found.  Recent Labs    06/25/24 0520 06/25/24 1428  WBC 2.3* 2.8*  HGB 10.0* 9.7*  HCT 30.7* 29.6*  PLT 218 215    Recent Labs    06/25/24 0520  NA 140  K 4.3  CL 110  CO2 22  GLUCOSE 87  BUN 16  CREATININE 0.83  CALCIUM  7.9*     Intake/Output Summary (Last 24 hours) at 06/27/2024 1220 Last data filed at 06/26/2024 2100 Gross per 24 hour  Intake 478 ml  Output 600 ml  Net -122 ml        Physical Exam: Vital Signs Blood pressure (!) 107/57, pulse 60, temperature 97.9 F (36.6 C), temperature source Oral, resp. rate 18, height 5' (1.524 m), weight 59.3 kg, SpO2 98%.         General: awake, alert, appropriate, supine in bed, NAD HENT: conjugate gaze; oropharynx moist- missing 2 front teeth chronic CV: regular rate; and rhythm no JVD Pulmonary: CTA B/L; no W/R/R- good air movement GI: soft, NT, ND, (+)BS- normoactive Psychiatric: extremely interactive Neurological: Ox3     Comments: LLE sutures look good- healing well- no drainage- still has purple bruising down entire L lateral thigh Neurologic: Cranial nerves II through XII intact, motor strength is 5/5 in bilateral deltoid, bicep, tricep, grip, 4-/5 RIght and 3-/5 Left hip flexor, knee extensors, ankle dorsiflexor and plantar flexor Sensory exam normal sensation to light touch  in bilateral upper and lower extremities Cerebellar exam normal finger to nose to finger  in bilateral upper extremities  Prior neuro  assessment is c/w today's exam 06/27/2024.  Assessment/Plan: 1. Functional deficits which require 3+ hours per day of interdisciplinary therapy in a comprehensive inpatient rehab setting. Physiatrist is providing close team supervision and 24 hour management of active medical problems listed below. Physiatrist and rehab team continue to assess barriers to discharge/monitor patient progress toward functional and medical goals  Care Tool:  Bathing    Body parts bathed by patient: Right arm, Left arm, Chest, Abdomen, Right upper leg, Left upper leg, Face   Body parts bathed by helper: Front perineal area, Buttocks, Right lower leg, Left lower leg     Bathing assist Assist Level: Moderate Assistance - Patient 50 - 74%     Upper Body Dressing/Undressing Upper body dressing   What is the patient wearing?: Pull over shirt, Orthosis    Upper body assist Assist Level: Supervision/Verbal cueing    Lower Body Dressing/Undressing Lower body dressing      What is the patient wearing?: Pants     Lower body assist Assist for lower body dressing: Minimal Assistance - Patient > 75%     Toileting Toileting Toileting Activity did  not occur Press photographer and hygiene only):  (void)  Toileting assist Assist for toileting: Minimal Assistance - Patient > 75% Assistive Device Comment: RW   Transfers Chair/bed transfer  Transfers assist     Chair/bed transfer assist level: Supervision/Verbal cueing     Locomotion Ambulation   Ambulation assist      Assist level: Contact Guard/Touching assist Assistive device: Walker-rolling Max distance: 220'   Walk 10 feet activity   Assist     Assist level: Contact Guard/Touching assist Assistive device: Walker-rolling   Walk 50 feet activity   Assist Walk 50 feet with 2 turns activity did not occur: Safety/medical concerns (pain, weakness)  Assist level: Contact Guard/Touching assist Assistive device: Walker-rolling     Walk 150 feet activity   Assist Walk 150 feet activity did not occur: Safety/medical concerns (pain, weakness)  Assist level: Contact Guard/Touching assist Assistive device: Walker-rolling    Walk 10 feet on uneven surface  activity   Assist Walk 10 feet on uneven surfaces activity did not occur: Safety/medical concerns (pain, weakness)         Wheelchair     Assist Is the patient using a wheelchair?: No Type of Wheelchair: Manual Wheelchair activity did not occur: Safety/medical concerns (pain, weakness, TTWB LLE)         Wheelchair 50 feet with 2 turns activity    Assist    Wheelchair 50 feet with 2 turns activity did not occur: Safety/medical concerns (pain, weakness, TTWB LLE)       Wheelchair 150 feet activity     Assist  Wheelchair 150 feet activity did not occur: Safety/medical concerns (pain, weakness, TTWB LLE)       Blood pressure (!) 107/57, pulse 60, temperature 97.9 F (36.6 C), temperature source Oral, resp. rate 18, height 5' (1.524 m), weight 59.3 kg, SpO2 98%.    Medical Problem List and Plan: 1. Functional deficits secondary to Polytrauma             -patient may not shower? Will check with OT- might be able to wrap over the brace and still shower             -ELOS/Goals: 7-10d  D/c date 06/29/24 Assured pt will speak with husband prior to d/c- will make sure she understands her TDWB is for 6-8 weeks on LLE- xray due- will order- also removing L thigh sutures per  time ot do so  Con't CIR PT and OT D/c 8/8  Went over labs with pt in depth- spoke to MD about hematology and WBC.  2.  Antithrombotics: -DVT/anticoagulation:  Pharmaceutical: Lovenox              -antiplatelet therapy: N/A 3. Pain Management:  Hydrocodone  10 mg prn severe pain and 5 mg prn moderate pain. Premedicate prior to therapy--has a lot of anxiety regarding activity tolerance/pain.              --continues to have muscle spasms worse when in chair. Continue  robaxin  750 mg tid. May need baclofen  additionally. 7/29- pt received scheduled pain meds this AM- but not prns- will d/w nursing 7/30- changed Norco to say can give within 30 minutes of scheduled Norco  7/31- will increase Robaxin  to 1000 mg 4x/day.  8/2-8/3 pain moderately better but still rates as 7-8/10 8/4 pain 7 out of 10 today, has not had as needed medications today yet, continue current regimen  8/5- Pain high but just got meds 2-4 minutes ago 8/6- will change Robaxin   to Flexeril  10 mg TID and add Lidodemr patches at night- 2 pathces 8pm to 8am 4. Mood/Behavior/Sleep: LCSW to follow for evaluation and support.              -antipsychotic agents: N/A. Team to provide ego support.             --melatonin prn for insomnia.   7/29- will add Buspar  5 mg TID for anxiety  8/2- Melatonin now scheduled   Patient reports sleeping much better with melatonin  8/6- slept poorly due to pain 5. Neuropsych/cognition: This patient is capable of making decisions on her own behalf. 6. Skin/Wound Care: Routine pressure relief measures.   8/2- wrote to remove IV  8/3- wrote to remove her Sutures on L thigh 7. Fluids/Electrolytes/Nutrition: Monitor I/O. Check CMET in am 8. T12 Chance Fx s/p fixation: Wear TLSO when out of bed 9. Displaced femur fx s/p ORIF: TTWB with follow up X rays in 2 weeks  8/5- Xray looks OK-  10. H/o Multifactorial Neuropathy: Continue gabapentin  BID.  Has chronic L2-3 stenosis which has worsened over time.   11.  H/o morbid Obesity s/p duodenal switch surgery: NO longer on meds for HTN, DM  and does not have sleep apnea. Pt reports 150lb weight loss  --Reports has not been able to afford vitamins--discussed generic --options. --Resume copper . Change Vit C to B complex w/C.  7/30- pt was concerned wasn't getting vitamins- compared to acute and is.  12. H/o dumping syndrome:  Has had bowel and bladder incontinence since DSS. 7/29- questionable urinary retention now and feeling  like cannot empty bowels as well- could have neurogenic bowel and bladder- will order bladder scans  q6 hours scheduled 7/30- no increased PVRs right now so far and no BM yesterday- will restart Senna- not colace, since causes looser stools,  7/31- LBM x2 this AM- on toilet for at least 1x- urinating- not checking PVRs/bladder scans that can see in chart. Had a bladder scan of 46cc yesterday- and Zero PVR x1.   8/3- LBM yesterday 8/4 LBM last night 8/6- LBM yesterday 13. H/o spinal stenosis: Multiple surgery with RLE weakness 14. Thrombocytopenia: Pancytopenia in the past felt to be due to nutritional/copper  deficiency.              --Platelets recovering from 85 to 118  7/29- plts up to 203k 15. Acute blood loss anemia: Recheck hgb in am  7/29- Hb stable at 9.5- but MCV 102- increasing  since admission- started on Vit B complex with Vit C- will con't to monitor trend since might need B12 injections.   8/4 stable at 10.0 today 16. Neutropenia: WBC- 6.5-->3.9.  Recheck in am.   7/29- is followed by Hematology for Neutropenia- Latest appt was ~ 1 month ago- WBC stable at 3.9  7/31- WBC down slightly to 3.4- will check 2x/week  8/4 WBC lower today, will add diff, Discussed with her oncologist-Appreciate assistance.  Likely due to cirrhosis- continue to monitor. Initiate Infectious workup if she develops fever or other indications of infection 17. Vitamin D  deficiency- 19.97: Change cholecalciferol  to ergocalciferol  added.   18. COPD: Hx of cough/second hand smoke exposure. Continue Breztri .  19. Hypotension: Will monitor weights daily. TEDs for edema control.  --D/c lasix . TEDs for edema control LLE. May need binder if orthostatic.  7/29- will see if needs to add midodrine.  8/1- haven't heard from therapy needs to add midodrine 8/5- looking better now 8/6- per therapy, no issues with  hypotension during therapy    06/27/2024    5:23 AM 06/26/2024    7:20 PM 06/26/2024    2:30 PM  Vitals with BMI   Weight 130 lbs 12 oz    BMI 25.53    Systolic 107 109 893  Diastolic 57 53 52  Pulse 60 63 61    20. Hypothyroid: Continue supplement.   21. Hypocalcemia  7/29- Ca is 7.2- but albumin  is 2.5- will check Ionized Calcium  tomorrow to make sure if needs to replete, esp with Duodenal switch surgery.   7/30- is pending. 7/31- iCa good at 4.7-and Ca up to 7.8 on BMP today  8/4 calcium  stable at 7.9 22. CBG's  8/2- will stop CBGs since they have all been above 75 for last 5 days- lowest 78.    I spent a total of  39  minutes on total care today- >50% coordination of care- due to  D/w with pt at length about her concerns-  and pain-also spoke with nursing.    Per [facility] 06/26/24 I would just keep reminding them that just because she is WBAT does not mean we expect her to be walking normally. I always tell pts I think of the fracture healing process in 2 phases: the first 6-8 weeks to get the fracture to heal and the next 6-8 weeks to get the strength back. given her back as well I wouldn't see her getting off a walker or crutches for at least 6-8 weeks     LOS: 9 days A FACE TO FACE EVALUATION WAS PERFORMED  Atzel Mccambridge 06/27/2024, 12:20 PM

## 2024-06-27 NOTE — Progress Notes (Signed)
 Occupational Therapy Session Note  Patient Details  Name: Kimberly [name] MRN: [id number] Date of Birth: 24-Feb-1956  Today's Date: 06/27/2024 OT Individual Time: 1300-1415 OT Individual Time Calculation (min): 75 min    Short Term Goals: Week 1:  OT Short Term Goal 1 (Week 1): Pt will complete toileting at [facility] with LRAD OT Short Term Goal 2 (Week 1): Pt will complete LB dressing at [facility] with AE as necessary OT Short Term Goal 3 (Week 1): Pt will complete transfera at [facility] with LRAD  Skilled Therapeutic Interventions/Progress Updates:      Therapy Documentation Precautions:  Precautions Precautions: Back Precaution Booklet Issued: Yes (comment) Recall of Precautions/Restrictions: Intact Precaution/Restrictions Comments: TTWB LLE, TSLO OOB, NO BLT; pt able to recall Required Braces or Orthoses: Spinal Brace Spinal Brace: Thoracolumbosacral orthotic, Applied in sitting position Restrictions Weight Bearing Restrictions Per Provider Order: Yes LLE Weight Bearing Per Provider Order: Touchdown weight bearing Other Position/Activity Restrictions: TTWB LLE General: Pt supine in bed upon OT arrival, agreeable to OT session.  Pain: 7/10 pain reported in low back and hip, activity, intermittent rest breaks, distractions provided for pain management, pt reports tolerable to proceed.    ADL: OT providing skilled intervention on ADL retraining in order to increase independence with tasks and increase activity tolerance. Pt completed the following tasks at the current level of assist: Bed mobility: SBA supine><EOB from flat bed using log roll technique Toilet transfer: SBA ambulating with RW to [facility] over toilet Toileting: SBA overall, voiding urine, able to complete 3/3 steps of toileting Transfers: pt SBA with household ambulation throughout ADL apartment with RW  Exercises: OT providing skilled intervention for ADL/IADL tasks in ADL suite. OT instructing patient on finding items throughout  cabinets, fridge, and pantry at various heights and reaching out of BOS up/down within back and WB precautions. Pt able to complete task at [facility] with education for correct angles to take when opening fridge/cabinets. OT educating patient on kitchen safety while patient completing task. Pt also able to complete furniture transfers at SBA with RW from couch.   Pt supine in bed with bed alarm activated, 2 bed rails up, call light within reach and 4Ps assessed.   Therapy/Group: Individual Therapy  Camie Hoe, OTD, OTR/L 06/27/2024, 8:10 PM

## 2024-06-27 NOTE — Progress Notes (Addendum)
 Physical Therapy Session Note  Patient Details  Name: Kimberly [name] MRN: [id number] Date of Birth: 1956-01-30  Today's Date: 06/27/2024 PT Individual Time: 1002-1115 PT Individual Time Calculation (min): 73 min   Short Term Goals: Week 1:  PT Short Term Goal 1 (Week 1): = LTG  Skilled Therapeutic Interventions/Progress Updates:      Pt seated in WC upon arrival. Pt agreeable to therapy. Pt reports 8/10 back pain. Pt reports difficulty sleeping last night 2/2 severe muscle spasms. Pt requesting pain medicine, notified nurse. Nurse present to administer pain medication.   Pt wearing TLSO. Pt agreeable to trialing size medium knee high ted hose versus large knee high ted hose. Education provided regarding purpose and benefits of compression socks.    Pt RW delivered to room. PT adjusted RW to pt appropriate height and added co-band to handgrips for improved comfort and grip per pt request.   Discussed pt concerns regarding D/C 8/8. Pt requesting to practice car transfer and curb step navigation with use of shower chair technique.   Pt ambulated room to main gym with RW and supervision/mod I with one  seated rest break in chair.   Pt performed stand pivot transfer WC to shower chair (positioned on curb step) with RW and CGA.  Pt performed stand pivot transfer WC to car simulator x2 with RW and supervision. Pt initially requiring min A to lift L LE into car, however progressed to supervision with use of gait belt. Education provided for technique with implementation of spinal precautions.   Pt supine in bed at end of session with all needs within reach and bed alarm on.       Therapy Documentation Precautions:  Precautions Precautions: Back Precaution Booklet Issued: Yes (comment) Recall of Precautions/Restrictions: Intact Precaution/Restrictions Comments: TTWB LLE, TSLO OOB, NO BLT; pt able to recall Required Braces or Orthoses: Spinal Brace Spinal Brace: Thoracolumbosacral  orthotic, Applied in sitting position Restrictions Weight Bearing Restrictions Per Provider Order: Yes LLE Weight Bearing Per Provider Order: Touchdown weight bearing Other Position/Activity Restrictions: TTWB LLE  Therapy/Group: Individual Therapy  [facility] [place], [place], DPT  06/27/2024, 10:02 AM

## 2024-06-27 NOTE — Progress Notes (Signed)
 Physical Therapy Session Note  Patient Details  Name: Kimberly [name] MRN: [id number] Date of Birth: Jul 19, 1956  Today's Date: 06/27/2024 PT Individual Time: 1520-1601 PT Individual Time Calculation (min): 41 min  Short Term Goals: Week 1:  PT Short Term Goal 1 (Week 1): = LTG   Skilled Therapeutic Interventions/Progress Updates:  Patient supine in bed on entrance to room. Patient alert and agreeable to PT session.   Patient with pain complaint in LLE at start of session. RN present to provide muscle relaxer and anxiety medication.   Therapeutic Activity: Pt with questions re: progressing to WBAT which MD has related that she can start to perform on Friday - also d/c date. Husband can not come prior to d/c date for family education/ training. Demonstrated progression from TTWB > TDWB > PWB > WBAT with verbal instructions. Will provide handout re: weight bearing with RW.   Therapeutic Exercise: Pt performed the following exercises with vc/ tc for proper technique. Supine SLR 3x5reps LLE, 2x10 RLE Supine heel slides 2x10 reps LLE Supine SL abd/ add 1x10 reps LLE Supine SL lift into abd/ add over low obstacle 1x10 reps LLE Supine ankle mobility including DF/ PF, ev/ inv, circles CW/ CCW BLE  Patient supine in bed at end of session with brakes locked, bed alarm set, and all needs within reach.   Therapy Documentation Precautions:  Precautions Precautions: Back Precaution Booklet Issued: Yes (comment) Recall of Precautions/Restrictions: Intact Precaution/Restrictions Comments: TTWB LLE, TSLO OOB, NO BLT; pt able to recall Required Braces or Orthoses: Spinal Brace Spinal Brace: Thoracolumbosacral orthotic, Applied in sitting position Restrictions Weight Bearing Restrictions Per Provider Order: Yes LLE Weight Bearing Per Provider Order: Touchdown weight bearing Other Position/Activity Restrictions: TTWB LLE  Pain: Pt relates pain with LLE therex and addressed with cryotherapy  and rest as pt premedicated.   Therapy/Group: Individual Therapy  Mliss DELENA Milliner PT, DPT, CSRS 06/26/2024, 4:45 PM

## 2024-06-27 NOTE — Progress Notes (Signed)
 Recreational Therapy Session Note  Patient Details  Name: Kimberly [name] MRN: [id number] Date of Birth: November 23, 1955 Today's Date: 06/27/2024  Pain: no c/o  Pt participated in animal assisted activity bed level with supervision. Pt easily engaged with pet partner team and was appreciative of this visit.  Emilie Carp 06/27/2024, 2:30 PM

## 2024-06-27 NOTE — Plan of Care (Signed)
  Problem: Consults Goal: RH GENERAL PATIENT EDUCATION Description: See Patient Education module for education specifics. Outcome: Progressing   

## 2024-06-28 ENCOUNTER — Other Ambulatory Visit: Payer: Self-pay

## 2024-06-28 LAB — CBC
HCT: 28.7 % — ABNORMAL LOW (ref 36.0–46.0)
Hemoglobin: 9.4 g/dL — ABNORMAL LOW (ref 12.0–15.0)
MCH: 34.2 pg — ABNORMAL HIGH (ref 26.0–34.0)
MCHC: 32.8 g/dL (ref 30.0–36.0)
MCV: 104.4 fL — ABNORMAL HIGH (ref 80.0–100.0)
Platelets: 190 K/uL (ref 150–400)
RBC: 2.75 MIL/uL — ABNORMAL LOW (ref 3.87–5.11)
RDW: 17 % — ABNORMAL HIGH (ref 11.5–15.5)
WBC: 2.3 K/uL — ABNORMAL LOW (ref 4.0–10.5)
nRBC: 0 % (ref 0.0–0.2)

## 2024-06-28 LAB — BASIC METABOLIC PANEL WITH GFR
Anion gap: 5 (ref 5–15)
BUN: 28 mg/dL — ABNORMAL HIGH (ref 8–23)
CO2: 24 mmol/L (ref 22–32)
Calcium: 8 mg/dL — ABNORMAL LOW (ref 8.9–10.3)
Chloride: 108 mmol/L (ref 98–111)
Creatinine, Ser: 0.92 mg/dL (ref 0.44–1.00)
GFR, Estimated: >60 mL/min (ref >60)
Glucose, Bld: 78 mg/dL (ref 70–99)
Potassium: 4.5 mmol/L (ref 3.5–5.1)
Sodium: 137 mmol/L (ref 135–145)

## 2024-06-28 MED ORDER — CYCLOBENZAPRINE HCL 10 MG PO TABS
10.0000 mg | ORAL_TABLET | Freq: Three times a day (TID) | ORAL | 0 refills | Status: DC
  Filled 2024-06-28: qty 90, 30d supply, fill #0

## 2024-06-28 MED ORDER — LIDOCAINE 5 % EX PTCH
2.0000 | MEDICATED_PATCH | CUTANEOUS | 0 refills | Status: AC
  Filled 2024-06-28: qty 60, 30d supply, fill #0

## 2024-06-28 MED ORDER — B COMPLEX-C PO TABS: 1.0000 | ORAL_TABLET | Freq: Every day | ORAL | Status: AC

## 2024-06-28 MED ORDER — MELATONIN 5 MG PO TABS
5.0000 mg | ORAL_TABLET | Freq: Every day | ORAL | 0 refills | Status: AC
  Filled 2024-06-28: qty 30, 30d supply, fill #0

## 2024-06-28 MED ORDER — ZINC SULFATE 220 (50 ZN) MG PO TABS
220.0000 mg | ORAL_TABLET | Freq: Every day | ORAL | 0 refills | Status: AC
  Filled 2024-06-28: qty 13, 13d supply, fill #0

## 2024-06-28 MED ORDER — HYDROCODONE-ACETAMINOPHEN 10-325 MG PO TABS
1.0000 | ORAL_TABLET | Freq: Four times a day (QID) | ORAL | 0 refills | Status: DC | PRN
  Filled 2024-06-28: qty 28, 7d supply, fill #0

## 2024-06-28 MED ORDER — VITAMIN D (ERGOCALCIFEROL) 1.25 MG (50000 UNIT) PO CAPS
50000.0000 [IU] | ORAL_CAPSULE | ORAL | 0 refills | Status: AC
  Filled 2024-06-28: qty 3, 21d supply, fill #0

## 2024-06-28 MED ORDER — SENNA 8.6 MG PO TABS
1.0000 | ORAL_TABLET | Freq: Every day | ORAL | 0 refills | Status: DC
  Filled 2024-06-28: qty 30, 30d supply, fill #0

## 2024-06-28 MED ORDER — BENEPROTEIN PO POWD: 1.0000 | Freq: Three times a day (TID) | ORAL | Status: AC

## 2024-06-28 MED ORDER — BUSPIRONE HCL 5 MG PO TABS
5.0000 mg | ORAL_TABLET | Freq: Three times a day (TID) | ORAL | 0 refills | Status: AC
  Filled 2024-06-28: qty 90, 30d supply, fill #0

## 2024-06-28 MED ORDER — FAMOTIDINE 40 MG PO TABS
40.0000 mg | ORAL_TABLET | Freq: Every day | ORAL | 0 refills | Status: AC
  Filled 2024-06-28: qty 30, 30d supply, fill #0

## 2024-06-28 NOTE — Progress Notes (Signed)
 Recreational Therapy Session Note  Patient Details  Name: [name] MRN: [id number] Date of Birth: 09-Dec-1955 Today's Date: 06/28/2024  Pain: no c/o Goal:  Pt will be provided with verbal education and handouts on previously discussed coping strategies.  MET  Skilled Therapeutic Interventions/Progress Updates: Met with pt for the second part of stress management/coping education.  Discussed in detail and provided instructional handouts on deep breathing, progressive muscle relaxation, imagery & challenging irrational thoughts.  Reviewed additional resources available online and through apps for support.  Faye Strohman 06/28/2024, 8:32 AM

## 2024-06-28 NOTE — Progress Notes (Signed)
 Inpatient Rehabilitation Care Coordinator Discharge Note   Patient Details  Name: Kimberly [name] MRN: [id number] Date of Birth: Nov 20, 1956   Discharge location: HOME WITH HUSBAND WHO CAN ASSIST WITH CARE  Length of Stay: 11 DAYS  Discharge activity level: MOD/I [facility] LEVEL  Home/community participation: ACTIVE  Patient response un:Yzjouy Literacy - How often do you need to have someone help you when you read instructions, pamphlets, or other written material from your doctor or pharmacy?: Never  Patient response un:Dnrpjo Isolation - How often do you feel lonely or isolated from those around you?: Never  Services provided included: MD, RD, PT, OT, RN, CM, TR, Pharmacy, SW  Financial Services:  Field seismologist Utilized: Media planner [facility] MEDICARE  Choices offered to/list presented to: PT  Follow-up services arranged:  Home Health, DME, Patient/Family has no preference for HH/DME agencies [facility] Health Agency: [facility]  PT & OT    DME : [facility]    Patient response to transportation need: Is the patient able to respond to transportation needs?: Yes In the past 12 months, has lack of transportation kept you from medical appointments or from getting medications?: No In the past 12 months, has lack of transportation kept you from meetings, work, or from getting things needed for daily living?: No   Patient/Family verbalized understanding of follow-up arrangements:  Yes  Individual responsible for coordination of the follow-up plan: SELF [phone]  Confirmed correct DME delivered: [place], [place] 06/28/2024    Comments (or additional information):PT DID WELL AND PROGRESSED QUICKLY. HUSBAND WAS NOT ABLE TO COME IN DUE TO CAR ISSUES BUT THERAPIES CALLED ON THE PHONE AND PT CAN DIRECT HER OWN CARE  Summary of Stay    Date/Time Discharge Planning CSW  06/26/24 [phone] HOme with husband who is able to assist, will have come in for  education and work on DME and follow up RGD  06/19/24 0910 New evaluation-home with husband who is able to assist and has a year ago after antoehr fall with fractures. Has wc, hemi walker and rw RGD       Foye Damron G

## 2024-06-28 NOTE — Plan of Care (Signed)
  Problem: RH Balance Goal: LTG Patient will maintain dynamic standing with ADLs (OT) Description: LTG:  Patient will maintain dynamic standing balance with assist during activities of daily living (OT)  Outcome: Completed/Met Flowsheets (Taken 06/19/2024 1217) LTG: Pt will maintain dynamic standing balance during ADLs with: Independent with assistive device   Problem: RH Eating Goal: LTG Patient will perform eating w/assist, cues/equip (OT) Description: LTG: Patient will perform eating with assist, with/without cues using equipment (OT) Outcome: Completed/Met Flowsheets (Taken 06/19/2024 1217) LTG: Pt will perform eating with assistance level of: Independent with assistive device    Problem: RH Grooming Goal: LTG Patient will perform grooming w/assist,cues/equip (OT) Description: LTG: Patient will perform grooming with assist, with/without cues using equipment (OT) Outcome: Completed/Met Flowsheets (Taken 06/19/2024 1217) LTG: Pt will perform grooming with assistance level of: Independent with assistive device    Problem: RH Bathing Goal: LTG Patient will bathe all body parts with assist levels (OT) Description: LTG: Patient will bathe all body parts with assist levels (OT) Outcome: Completed/Met Flowsheets (Taken 06/19/2024 1217) LTG: Pt will perform bathing with assistance level/cueing: Independent with assistive device    Problem: RH Dressing Goal: LTG Patient will perform upper body dressing (OT) Description: LTG Patient will perform upper body dressing with assist, with/without cues (OT). Outcome: Completed/Met Flowsheets (Taken 06/19/2024 1217) LTG: Pt will perform upper body dressing with assistance level of: Independent with assistive device Goal: LTG Patient will perform lower body dressing w/assist (OT) Description: LTG: Patient will perform lower body dressing with assist, with/without cues in positioning using equipment (OT) Outcome: Completed/Met Flowsheets (Taken  06/19/2024 1217) LTG: Pt will perform lower body dressing with assistance level of: Independent with assistive device   Problem: RH Toileting Goal: LTG Patient will perform toileting task (3/3 steps) with assistance level (OT) Description: LTG: Patient will perform toileting task (3/3 steps) with assistance level (OT)  Outcome: Completed/Met Flowsheets (Taken 06/19/2024 1217) LTG: Pt will perform toileting task (3/3 steps) with assistance level: Independent with assistive device   Problem: RH Toilet Transfers Goal: LTG Patient will perform toilet transfers w/assist (OT) Description: LTG: Patient will perform toilet transfers with assist, with/without cues using equipment (OT) Outcome: Completed/Met Flowsheets (Taken 06/19/2024 1217) LTG: Pt will perform toilet transfers with assistance level of: Independent with assistive device   Problem: RH Tub/Shower Transfers Goal: LTG Patient will perform tub/shower transfers w/assist (OT) Description: LTG: Patient will perform tub/shower transfers with assist, with/without cues using equipment (OT) Outcome: Completed/Met Flowsheets (Taken 06/19/2024 1217) LTG: Pt will perform tub/shower stall transfers with assistance level of: Independent with assistive device

## 2024-06-28 NOTE — Progress Notes (Addendum)
 Physical Therapy Discharge Summary  Patient Details  Name: Kimberly [name] MRN: [id number] Date of Birth: July 12, 1956  Date of Discharge from PT service:June 28, 2024  Today's Date: 06/28/2024 PT Individual Time: 8669-8575 PT Individual Time Calculation (min): 54 min    Patient has met 10 of 10 long term goals due to improved activity tolerance, improved balance, improved postural control, increased strength, increased range of motion, decreased pain, functional use of  left lower extremity, and improved coordination.  Patient to discharge at an ambulatory level Modified Independent.   Patient's care partner is independent to provide the necessary physical assistance at discharge.  Reasons goals not met: N/A  Recommendation:  Patient will benefit from ongoing skilled PT services in home health setting to continue to advance safe functional mobility, address ongoing impairments in LLE weakness, limited weight bearing through LLE, endurance, and minimize fall risk.  Equipment: RW  Reasons for discharge: treatment goals met and discharge from hospital  Patient/family agrees with progress made and goals achieved: Yes  PT Discharge Precautions/Restrictions TLSO when OOB Therapy Vitals Temp: 98.5 F (36.9 C) Temp Source: Oral Pulse Rate: 65 Resp: 17 BP: 105/65 Patient Position (if appropriate): Lying Oxygen  Therapy SpO2: 99 % Pain Pain Assessment Pain Scale: 0-10 Pain Score: 7 Pain Location: Back Pain Intervention(s): Medication (See eMAR) Pain Interference Pain Effect on Sleep: 2. Occasionally Pain Interference with Therapy Activities: 1. Rarely or not at all Pain Interference with Day-to-Day Activities: 1. Rarely or not at all Vision - History Ability to See in Adequate Light: 1 Impaired Perception Perception: Within Functional Limits Praxis Praxis: WFL  Cognition Overall Cognitive Status: Within Functional Limits for tasks assessed Arousal/Alertness:  Awake/alert Orientation Level: Oriented X4 Attention: Focused Focused Attention: Appears intact Memory: Appears intact Awareness: Appears intact Problem Solving: Appears intact Safety/Judgment: Appears intact Sensation Sensation Light Touch: Appears Intact Hot/Cold: Appears Intact Proprioception: Appears Intact Stereognosis: Not tested Coordination Gross Motor Movements are Fluid and Coordinated: Yes Fine Motor Movements are Fluid and Coordinated: Yes    RLE Assessment RLE Assessment: Within Functional Limits General Strength Comments: grossly 5/5 throughout LLE Assessment LLE Assessment: Within Functional Limits General Strength Comments: grossly 4/5 with pain   [name] Fast 06/28/2024, 4:04 PM

## 2024-06-28 NOTE — Group Note (Signed)
 Patient Details Name: Kimberly [name] MRN: [id number] DOB: Apr 03, 1956 Today's Date: 06/28/2024  Time Calculation: OT Group Time Calculation OT Group Start Time: 1430 OT Group Stop Time: 1530 OT Group Time Calculation (min): 60 min      Group Description: Dance Group: Pt participated in dance group with an emphasis on social interaction, motor planning, increasing overall activity tolerance and bimanual tasks. All songs were selected by group members. Dance moves included AROM of BUE/BLE gross motor movements with an emphasis on building functional endurance.   Individual level documentation: Patient completed group from sitting level. Patientt needed supervision to complete various dance moves with cues to maintain precautions.  Patient needed min modifications during group.  Pain: 0/10  Precautions:  TLSO  Kimberly [name] 06/28/2024, 3:36 PM

## 2024-06-28 NOTE — Group Note (Signed)
 Patient Details Name: Kimberly [name] MRN: [id number] DOB: 1956/09/17 Today's Date: 06/28/2024   Group Description: Goal:  Pt will actively participate in 60 minute therapeutic dance group initiating rest breaks as needed & maintaining any precautions with supervision.  Dance Group: Pt participated in dance group with an emphasis on social interaction, motor planning, increasing overall activity tolerance and bimanual tasks. All songs were selected by group members. Dance moves included AROM of BUE/BLE gross motor movements with an emphasis on building functional endurance.   Individual level documentation:Goal met. Patient completed group from sitting level. Patient needed supervision to complete various dance moves with cues for encouragement at times.  Pt with bouts of fatigue requiring frequent rest breaks but pt able to recognize need for rest and initiated rest breaks with I.  Patient needed minimal modifications during group.  Pain:no c/o Pain Assessment Pain Scale: 0-10 Pain Score: 8  Pain Location: Back Pain Intervention(s): Medication (See eMAR)  Precautions:  TTWB LLE, TSLO OOB, NO BLT; pt able to recall  Spinal Brace: Thoracolumbosacral orthotic, Applied in sitting position   Kimberly [name] 06/28/2024, 3:57 PM

## 2024-06-29 ENCOUNTER — Other Ambulatory Visit (HOSPITAL_COMMUNITY): Payer: Self-pay

## 2024-06-29 DIAGNOSIS — S22082D Unstable burst fracture of T11-T12 vertebra, subsequent encounter for fracture with routine healing: Secondary | ICD-10-CM

## 2024-06-29 NOTE — Plan of Care (Signed)
  Problem: Consults Goal: RH GENERAL PATIENT EDUCATION Description: See Patient Education module for education specifics. Outcome: Progressing   Problem: RH BOWEL ELIMINATION Goal: RH STG MANAGE BOWEL WITH ASSISTANCE Description: STG Manage Bowel with supervision Assistance. Outcome: Progressing   Problem: RH BLADDER ELIMINATION Goal: RH STG MANAGE BLADDER WITH ASSISTANCE Description: STG Manage Bladder With supervision Assistance Outcome: Progressing   Problem: RH SKIN INTEGRITY Goal: RH STG SKIN FREE OF INFECTION/BREAKDOWN Description: Manage skin free of infection/breakdown with supervision assistance Outcome: Progressing   Problem: RH SAFETY Goal: RH STG ADHERE TO SAFETY PRECAUTIONS W/ASSISTANCE/DEVICE Description: STG Adhere to Safety Precautions With supervision  Assistance/Device. Outcome: Progressing   Problem: RH PAIN MANAGEMENT Goal: RH STG PAIN MANAGED AT OR BELOW PT'S PAIN GOAL Description: <4 w/ prns Outcome: Progressing   Problem: RH KNOWLEDGE DEFICIT GENERAL Goal: RH STG INCREASE KNOWLEDGE OF SELF CARE AFTER HOSPITALIZATION Description: Manage increase  knowledge of self care after hospitalization with supervision assistance from spouse using educational materials provided Outcome: Progressing

## 2024-06-29 NOTE — Progress Notes (Signed)
 PROGRESS NOTE   Subjective/Complaints:  Pt reports muscle cramps still bothering her but more tolerable with flexeril  than robaxin .  Nothing new going on.  Ready for d/c- family will get here 12-1pm.    ROS:  Per HPI   Pt denies SOB, abd pain, CP, N/V/C/D, and vision changes    Objective:   No results found.  Recent Labs    06/28/24 0438  WBC 2.3*  HGB 9.4*  HCT 28.7*  PLT 190    Recent Labs    06/28/24 0438  NA 137  K 4.5  CL 108  CO2 24  GLUCOSE 78  BUN 28*  CREATININE 0.92  CALCIUM  8.0*     Intake/Output Summary (Last 24 hours) at 06/29/2024 0837 Last data filed at 06/29/2024 0700 Gross per 24 hour  Intake 458 ml  Output --  Net 458 ml        Physical Exam: Vital Signs Blood pressure 98/71, pulse 99, temperature 98.3 F (36.8 C), temperature source Oral, resp. rate 18, height 5' (1.524 m), weight 65.2 kg, SpO2 98%.          General: awake, alert, appropriate,  walking around room with RW; NAD HENT: conjugate gaze; oropharynx moist CV: regular rate; no JVD Pulmonary: CTA B/L; no W/R/R- good air movement GI: soft, NT, ND, (+)BS Psychiatric: appropriate- flat, slightly anxious about d/c Neurological: Ox3     Comments: LLE sutures look good- healing well- no drainage- still has purple bruising down entire L lateral thigh Neurologic: Cranial nerves II through XII intact, motor strength is 5/5 in bilateral deltoid, bicep, tricep, grip, 4-/5 RIght and 3-/5 Left hip flexor, knee extensors, ankle dorsiflexor and plantar flexor Sensory exam normal sensation to light touch  in bilateral upper and lower extremities Cerebellar exam normal finger to nose to finger  in bilateral upper extremities  Prior neuro assessment is c/w today's exam 06/29/2024.  Assessment/Plan: 1. Functional deficits which require 3+ hours per day of interdisciplinary therapy in a comprehensive inpatient rehab  setting. Physiatrist is providing close team supervision and 24 hour management of active medical problems listed below. Physiatrist and rehab team continue to assess barriers to discharge/monitor patient progress toward functional and medical goals  Care Tool:  Bathing    Body parts bathed by patient: Right arm, Left arm, Chest, Abdomen, Right upper leg, Left upper leg, Face   Body parts bathed by helper: Front perineal area, Buttocks, Right lower leg, Left lower leg     Bathing assist Assist Level: Independent with assistive device     Upper Body Dressing/Undressing Upper body dressing   What is the patient wearing?: Pull over shirt, Orthosis    Upper body assist Assist Level: Independent with assistive device    Lower Body Dressing/Undressing Lower body dressing      What is the patient wearing?: Pants     Lower body assist Assist for lower body dressing: Independent with assitive device     Toileting Toileting Toileting Activity did not occur (Clothing management and hygiene only):  (void)  Toileting assist Assist for toileting: Independent with assistive device Assistive Device Comment: RW   Transfers Chair/bed transfer  Transfers assist  Chair/bed transfer assist level: Independent with assistive device Chair/bed transfer assistive device: Geologist, engineering   Ambulation assist      Assist level: Independent with assistive device Assistive device: Walker-rolling Max distance: >x350'   Walk 10 feet activity   Assist     Assist level: Independent with assistive device Assistive device: Walker-rolling   Walk 50 feet activity   Assist Walk 50 feet with 2 turns activity did not occur: Safety/medical concerns (pain, weakness)  Assist level: Independent with assistive device Assistive device: Walker-rolling    Walk 150 feet activity   Assist Walk 150 feet activity did not occur: Safety/medical concerns (pain,  weakness)  Assist level: Independent with assistive device Assistive device: Walker-rolling    Walk 10 feet on uneven surface  activity   Assist Walk 10 feet on uneven surfaces activity did not occur: Safety/medical concerns (pain, weakness)   Assist level: Independent with assistive device Assistive device: Walker-rolling   Wheelchair     Assist Is the patient using a wheelchair?: No Type of Wheelchair: Manual Wheelchair activity did not occur: Safety/medical concerns (pain, weakness, TTWB LLE)         Wheelchair 50 feet with 2 turns activity    Assist    Wheelchair 50 feet with 2 turns activity did not occur: Safety/medical concerns (pain, weakness, TTWB LLE)       Wheelchair 150 feet activity     Assist  Wheelchair 150 feet activity did not occur: Safety/medical concerns (pain, weakness, TTWB LLE)       Blood pressure 98/71, pulse 99, temperature 98.3 F (36.8 C), temperature source Oral, resp. rate 18, height 5' (1.524 m), weight 65.2 kg, SpO2 98%.    Medical Problem List and Plan: 1. Functional deficits secondary to Polytrauma             -patient may not shower? Will check with OT- might be able to wrap over the brace and still shower             -ELOS/Goals: 7-10d  D/c date 06/29/24 Assured pt will speak with husband prior to d/c- will make sure she understands her TDWB is for 6-8 weeks on LLE- xray due- will order- also removing L thigh sutures per  time ot do so  D/c today  Went over labs with pt in depth- spoke to MD about hematology and WBC.  2.  Antithrombotics: -DVT/anticoagulation:  Pharmaceutical: Lovenox              -antiplatelet therapy: N/A 3. Pain Management:  Hydrocodone  10 mg prn severe pain and 5 mg prn moderate pain. Premedicate prior to therapy--has a lot of anxiety regarding activity tolerance/pain.              --continues to have muscle spasms worse when in chair. Continue robaxin  750 mg tid. May need baclofen   additionally. 7/29- pt received scheduled pain meds this AM- but not prns- will d/w nursing 7/30- changed Norco to say can give within 30 minutes of scheduled Norco  7/31- will increase Robaxin  to 1000 mg 4x/day.  8/2-8/3 pain moderately better but still rates as 7-8/10 8/4 pain 7 out of 10 today, has not had as needed medications today yet, continue current regimen  8/5- Pain high but just got meds 2-4 minutes ago 8/6- will change Robaxin  to Flexeril  10 mg TID and add Lidodemr patches at night- 2 pathces 8pm to 8am 8/8- explained will get 7 days of pain meds and then needs to  f/u with surgeons for pain meds after that 4. Mood/Behavior/Sleep: LCSW to follow for evaluation and support.              -antipsychotic agents: N/A. Team to provide ego support.             --melatonin prn for insomnia.   7/29- will add Buspar  5 mg TID for anxiety  8/2- Melatonin now scheduled   Patient reports sleeping much better with melatonin  8/6- slept poorly due to pain 5. Neuropsych/cognition: This patient is capable of making decisions on her own behalf. 6. Skin/Wound Care: Routine pressure relief measures.   8/2- wrote to remove IV  8/3- wrote to remove her Sutures on L thigh 7. Fluids/Electrolytes/Nutrition: Monitor I/O. Check CMET in am 8. T12 Chance Fx s/p fixation: Wear TLSO when out of bed 9. Displaced femur fx s/p ORIF: TTWB with follow up X rays in 2 weeks  8/5- Xray looks OK-   8/8- can WBAT starting this AM 10. H/o Multifactorial Neuropathy: Continue gabapentin  BID.  Has chronic L2-3 stenosis which has worsened over time.   11.  H/o morbid Obesity s/p duodenal switch surgery: NO longer on meds for HTN, DM  and does not have sleep apnea. Pt reports 150lb weight loss  --Reports has not been able to afford vitamins--discussed generic --options. --Resume copper . Change Vit C to B complex w/C.  7/30- pt was concerned wasn't getting vitamins- compared to acute and is.  12. H/o dumping syndrome:  Has  had bowel and bladder incontinence since DSS. 7/29- questionable urinary retention now and feeling like cannot empty bowels as well- could have neurogenic bowel and bladder- will order bladder scans  q6 hours scheduled 7/30- no increased PVRs right now so far and no BM yesterday- will restart Senna- not colace, since causes looser stools,  7/31- LBM x2 this AM- on toilet for at least 1x- urinating- not checking PVRs/bladder scans that can see in chart. Had a bladder scan of 46cc yesterday- and Zero PVR x1.   8/3- LBM yesterday 8/4 LBM last night 8/6- LBM yesterday 13. H/o spinal stenosis: Multiple surgery with RLE weakness 14. Thrombocytopenia: Pancytopenia in the past felt to be due to nutritional/copper  deficiency.              --Platelets recovering from 85 to 118  7/29- plts up to 203k 15. Acute blood loss anemia: Recheck hgb in am  7/29- Hb stable at 9.5- but MCV 102- increasing  since admission- started on Vit B complex with Vit C- will con't to monitor trend since might need B12 injections.   8/4 stable at 10.0 today 16. Neutropenia: WBC- 6.5-->3.9.  Recheck in am.   7/29- is followed by Hematology for Neutropenia- Latest appt was ~ 1 month ago- WBC stable at 3.9  7/31- WBC down slightly to 3.4- will check 2x/week  8/4 WBC lower today, will add diff, Discussed with her oncologist-Appreciate assistance.  Likely due to cirrhosis- continue to monitor. Initiate Infectious workup if she develops fever or other indications of infection  8/8- WBC 2.3-needs to f/u with PCP in next 30 days 17. Vitamin D  deficiency- 19.97: Change cholecalciferol  to ergocalciferol  added.   18. COPD: Hx of cough/second hand smoke exposure. Continue Breztri .  19. Hypotension: Will monitor weights daily. TEDs for edema control.  --D/c lasix . TEDs for edema control LLE. May need binder if orthostatic.  7/29- will see if needs to add midodrine.  8/1- haven't heard from therapy needs to add  midodrine 8/5- looking  better now 8/6- per therapy, no issues with hypotension during therapy    06/29/2024    4:10 AM 06/28/2024    7:31 PM 06/28/2024    3:59 PM  Vitals with BMI  Weight 143 lbs 12 oz    BMI 28.07    Systolic 98 107 105  Diastolic 71 60 65  Pulse 99 90 65    20. Hypothyroid: Continue supplement.   21. Hypocalcemia  7/29- Ca is 7.2- but albumin  is 2.5- will check Ionized Calcium  tomorrow to make sure if needs to replete, esp with Duodenal switch surgery.   7/30- is pending. 7/31- iCa good at 4.7-and Ca up to 7.8 on BMP today  8/4 calcium  stable at 7.9 22. CBG's  8/2- will stop CBGs since they have all been above 75 for last 5 days- lowest 78.   I spent a total of 34   minutes on total care today- >50% coordination of care- due to  D/w pt about pain meds- no need for a rehab f/u and f/u with surgeons as well as timing of family coming  Ms.The patient is medically ready for discharge to home and will not need follow-up with [facility]. In addition, they will need to follow up with their PCP, Neurosurgery and Orthopedics and f/u with her Hematology .    Per [facility] 06/26/24 I would just keep reminding them that just because she is WBAT does not mean we expect her to be walking normally. I always tell pts I think of the fracture healing process in 2 phases: the first 6-8 weeks to get the fracture to heal and the next 6-8 weeks to get the strength back. given her back as well I wouldn't see her getting off a walker or crutches for at least 6-8 weeks     LOS: 11 days A FACE TO FACE EVALUATION WAS PERFORMED  Cardarius Senat 06/29/2024, 8:37 AM

## 2024-06-29 NOTE — Progress Notes (Signed)
 Recreational Therapy Discharge Summary Patient Details  Name: Kimberly [name] MRN: [id number] Date of Birth: 29-Oct-1956 Today's Date: 06/29/2024   Comments on progress toward goals: Pt has made good progress during LOS and is discharging home today at Mod I ambulatory level with RW.  TR sessions focused on pt education in regards to leisure awareness, activity analysis/modifications, stress management/coping.  Pt provided with handouts on stress exploration & coping skills.  Pt also participated in therapeutic dance group and animal assisted activities (pet therapy) during LOS.  Pt is excited to return home to to previously enjoyed activities as able.   Reasons for discharge: discharge from hospital  Follow-up: Home Health  Patient/family agrees with progress made and goals achieved: Yes  Aleayah Chico 06/29/2024, 8:28 AM

## 2024-06-29 NOTE — Progress Notes (Signed)
 Inpatient Rehabilitation Discharge Medication Review by a Pharmacist  A complete drug regimen review was completed for this patient to identify any potential clinically significant medication issues.  High Risk Drug Classes Is patient taking? Indication by Medication  Antipsychotic No   Anticoagulant No   Antibiotic No   Opioid Yes Vicodin prn pain  Antiplatelet No   Hypoglycemics/insulin  No   Vasoactive Medication Yes Spironolactone  - fluid  Chemotherapy No   Other Yes Buspirone  - anxiety Lidocaine  patch - pain Vitamin D , Zinc  - supplement Cyclobenzaprine  prn spasms Trelegy - COPD Albuterol  prn SOB Famotidine , omeprazole  - Reflux Gabapentin  - pain Levothyroxine  - low thyroid  Ondansetron  prn N/V      Type of Medication Issue Identified Description of Issue Recommendation(s)  Drug Interaction(s) (clinically significant)     Duplicate Therapy     Allergy     No Medication Administration End Date     Incorrect Dose     Additional Drug Therapy Needed     Significant med changes from prior encounter (inform family/care partners about these prior to discharge).    Other       Clinically significant medication issues were identified that warrant physician communication and completion of prescribed/recommended actions by midnight of the next day:  No  Name of provider notified for urgent issues identified:   Provider Method of Notification:     Pharmacist comments: None  Time spent performing this drug regimen review (minutes):  20 minutes  Thank you. Olam Monte, PharmD

## 2024-07-04 DIAGNOSIS — G8929 Other chronic pain: Secondary | ICD-10-CM | POA: Diagnosis not present

## 2024-07-04 DIAGNOSIS — D62 Acute posthemorrhagic anemia: Secondary | ICD-10-CM | POA: Diagnosis not present

## 2024-07-04 DIAGNOSIS — K7581 Nonalcoholic steatohepatitis (NASH): Secondary | ICD-10-CM | POA: Diagnosis not present

## 2024-07-04 DIAGNOSIS — G4700 Insomnia, unspecified: Secondary | ICD-10-CM | POA: Diagnosis not present

## 2024-07-04 DIAGNOSIS — H409 Unspecified glaucoma: Secondary | ICD-10-CM | POA: Diagnosis not present

## 2024-07-04 DIAGNOSIS — M797 Fibromyalgia: Secondary | ICD-10-CM | POA: Diagnosis not present

## 2024-07-04 DIAGNOSIS — E039 Hypothyroidism, unspecified: Secondary | ICD-10-CM | POA: Diagnosis not present

## 2024-07-04 DIAGNOSIS — M5416 Radiculopathy, lumbar region: Secondary | ICD-10-CM | POA: Diagnosis not present

## 2024-07-04 DIAGNOSIS — R7989 Other specified abnormal findings of blood chemistry: Secondary | ICD-10-CM | POA: Insufficient documentation

## 2024-07-04 DIAGNOSIS — M48061 Spinal stenosis, lumbar region without neurogenic claudication: Secondary | ICD-10-CM | POA: Diagnosis not present

## 2024-07-04 DIAGNOSIS — M791 Myalgia, unspecified site: Secondary | ICD-10-CM | POA: Diagnosis not present

## 2024-07-04 DIAGNOSIS — I272 Pulmonary hypertension, unspecified: Secondary | ICD-10-CM | POA: Diagnosis not present

## 2024-07-04 DIAGNOSIS — D709 Neutropenia, unspecified: Secondary | ICD-10-CM | POA: Insufficient documentation

## 2024-07-04 DIAGNOSIS — I5032 Chronic diastolic (congestive) heart failure: Secondary | ICD-10-CM | POA: Diagnosis not present

## 2024-07-04 DIAGNOSIS — J4489 Other specified chronic obstructive pulmonary disease: Secondary | ICD-10-CM | POA: Diagnosis not present

## 2024-07-04 DIAGNOSIS — E1140 Type 2 diabetes mellitus with diabetic neuropathy, unspecified: Secondary | ICD-10-CM | POA: Diagnosis not present

## 2024-07-04 DIAGNOSIS — K589 Irritable bowel syndrome without diarrhea: Secondary | ICD-10-CM | POA: Diagnosis not present

## 2024-07-04 DIAGNOSIS — D696 Thrombocytopenia, unspecified: Secondary | ICD-10-CM | POA: Diagnosis not present

## 2024-07-04 DIAGNOSIS — I2510 Atherosclerotic heart disease of native coronary artery without angina pectoris: Secondary | ICD-10-CM | POA: Diagnosis not present

## 2024-07-04 DIAGNOSIS — S22089D Unspecified fracture of T11-T12 vertebra, subsequent encounter for fracture with routine healing: Secondary | ICD-10-CM | POA: Diagnosis not present

## 2024-07-04 DIAGNOSIS — K219 Gastro-esophageal reflux disease without esophagitis: Secondary | ICD-10-CM | POA: Diagnosis not present

## 2024-07-04 DIAGNOSIS — S72002D Fracture of unspecified part of neck of left femur, subsequent encounter for closed fracture with routine healing: Secondary | ICD-10-CM | POA: Diagnosis not present

## 2024-07-04 DIAGNOSIS — I110 Hypertensive heart disease with heart failure: Secondary | ICD-10-CM | POA: Diagnosis not present

## 2024-07-04 NOTE — Discharge Summary (Signed)
 Physician Discharge Summary  Patient ID: Kimberly [name] MRN: [id number] DOB/AGE: 03/22/1956 68 y.o.  Admit date: 06/18/2024 Discharge date: 06/29/2024  Discharge Diagnoses:  Principal Problem:   Thoracic spine fracture [facility]) Active Problems:   COPD (chronic obstructive pulmonary disease) (HCC)   Femur fracture, left (HCC)   Acute blood loss anemia   Neutropenia (HCC)   Prerenal azotemia   Anemia  Discharged Condition: stable  Significant Diagnostic Studies: DG FEMUR MIN 2 VIEWS LEFT Result Date: 06/24/2024 CLINICAL DATA:  [id number] Pain of left femur [id number] EXAM: LEFT FEMUR 2 VIEWS COMPARISON:  06/08/2024. FINDINGS: Redemonstration of patient's pre-existing left femur intramedullary nail with 2 proximal and distal fixation screws. No significant interval change in alignment when compared to the prior exam. No other acute fracture or dislocation. No aggressive osseous lesion. Mild-to-moderate degenerative changes of left hip and knee joints again seen. No focal soft tissue swelling. No radiopaque foreign bodies. IMPRESSION: No acute osseous abnormality of the left femur. Electronically Signed   By: Ree Molt M.D.   On: 06/24/2024 10:51    Labs:  Basic Metabolic Panel:    Latest Ref Rng & Units 06/28/2024    4:38 AM 06/25/2024    5:20 AM 06/21/2024    5:00 AM  BMP  Glucose 70 - 99 mg/dL 78  87  83   BUN 8 - 23 mg/dL 28  16  26    Creatinine 0.44 - 1.00 mg/dL 9.07  9.16  9.13   Sodium 135 - 145 mmol/L 137  140  136   Potassium 3.5 - 5.1 mmol/L 4.5  4.3  4.6   Chloride 98 - 111 mmol/L 108  110  110   CO2 22 - 32 mmol/L 24  22  18    Calcium  8.9 - 10.3 mg/dL 8.0  7.9  7.8      CBC:    Latest Ref Rng & Units 06/28/2024    4:38 AM 06/25/2024    2:28 PM 06/25/2024    5:20 AM  CBC  WBC 4.0 - 10.5 K/uL 2.3  2.8  2.3   Hemoglobin 12.0 - 15.0 g/dL 9.4  9.7  89.9   Hematocrit 36.0 - 46.0 % 28.7  29.6  30.7   Platelets 150 - 400 K/uL 190  215  218      CBG: No results for input(s):  GLUCAP in the last 168 hours.  Brief HPI:   Kimberly [name] is a 68 y.o. female with history of nonobstructive CAD D, COPD, cirrhosis of liver secondary to NASH, multiple back surgeries, obesity s/p duodenal switch, thrombocytopenia, macular degeneration who was admitted on 06/08/2024 after fall and found to have displaced proximal left femur fracture, presacral hematoma and possible unusual chance to have fracture involving T12 vertebral body and spinous process.  She was taken to the OR for ORIF with IM nailing left femur by Dr. Celena and open reduction with pedicle screw/rod construct T11-T12 to L1 by Dr. Debby on the same day.    Postop to a TLSO when out of bed and to be TTWB on LLE.  Hospital course significant for issues with constipation as well as poor p.o. intake with hypoglycemia as well as pancytopenia as well as vitamin D  deficiency noted at 19.97.  Therapy was consulted and patient was noted to be requiring contact-guard to min assist with ADLs and mobility.  She was independent prior to admission and CIR was recommended due to functional decline.   Hospital Course: Kimberly [name]  was admitted to rehab 06/18/2024 for inpatient therapies to consist of PT and OT at least three hours five days a week. Past admission physiatrist, therapy team and rehab RN have worked together to provide customized collaborative inpatient rehab. Blood pressures were monitored on TID basis and and noted to be soft therefore  Lasix  was discontinued.  She was advised to use support stockings for edema control as well as elevation.   Follow-up CBC shows H&H to be slowly improving.  She was noted to have drop in white count and this was discussed with hematology who recommended monitoring for stability.  White count with slight drop and she will need to follow-up with PCP for repeat check in couple of weeks.   X-rays of hip on 08/03 showing stability and she was advanced to weightbearing as tolerated on 08/08 per  Ortho input.  Subcu Lovenox  was used for DVT prophylaxis.  BuSpar  was added to help manage anxiety.  Sleep-wake disruption has improved with addition of melatonin. Pain has been managed with prn use of hydrocodone  Muscle spasms are improved with use of Flexeril  on a scheduled basis.  Hip incision is healing well without any signs or symptoms of infection. She has made good gains during her stay and is independent at discharge. She will continue to receive follow up HHPT and HHOT by [facility] after discharge.    Rehab course: During patient's stay in rehab weekly team conferences were held to monitor patient's progress, set goals and discuss barriers to discharge. At admission, patient required min assist for ADL tasks and with mobility. She  has had improvement in activity tolerance, balance, postural control as well as ability to compensate for deficits.  She is able to complete ADL tasks at modified independent level.  She is modified independent for transfers and to ambulate short distances with use of rolling walker due to advancement of weightbearing at discharge.  Discharge disposition: 06-Home-Health Care Svc  Diet: Regular  Special Instructions: Recommend repeat check CBC in 1-2 weeks to follow up on WBC and Hgb.  2.  Need to wear TLSO when at EOB/out of bed.   Allergies as of 06/29/2024       Reactions   Aleve [naproxen] Anaphylaxis, Swelling, Other (See Comments)   Throat closes   Cinnamon Anaphylaxis   Reaction caused by a medication interaction.   Other Shortness Of Breath, Other (See Comments)   Local anesthesia - triggers asthma attacks 08/06/22 tolerated amide local anesthetic in peripheral nerve block.   Roxicodone  [oxycodone ] Other (See Comments)   Over sedation   Atrovent Nasal Spray [ipratropium] Other (See Comments)   Epistaxis    Feldene [piroxicam] Nausea Only   Flonase [fluticasone ] Other (See Comments)   Epistaxis    Lyrica [pregabalin] Other (See  Comments)   Altered mental status for prolonged period   Nsaids Other (See Comments)   Hx of gastic bypass - told to avoid.   Phenergan [promethazine Hcl] Other (See Comments)   Triggers asthma attacks   Sulfa Antibiotics Other (See Comments)   Yeast infection   Zohydro Er [hydrocodone  Bitartrate Er] Other (See Comments)   Over sedation with IR and ER formulations   Latex Rash, Other (See Comments)   Skin blisters   Tape Rash, Other (See Comments)   Do not use adhesive tape; okay to use paper tape. 08/06/22 tolerated adhesive tape for peripheral IV        Medication List     STOP taking these medications  Copper  Gluconate 2 MG Tabs   denosumab  60 MG/ML Sosy injection Commonly known as: PROLIA    furosemide  20 MG tablet Commonly known as: LASIX    zolpidem  5 MG tablet Commonly known as: AMBIEN        TAKE these medications    acetaminophen  500 MG tablet Commonly known as: TYLENOL  Take 500 mg by mouth 2 (two) times daily as needed for moderate pain (pain score 4-6), fever or headache.   albuterol  108 (90 Base) MCG/ACT inhaler Commonly known as: VENTOLIN  HFA Inhale 2 puffs into the lungs 4 (four) times daily as needed for shortness of breath.   B-complex with vitamin C tablet Take 1 tablet by mouth daily.   busPIRone  5 MG tablet Commonly known as: BUSPAR  Take 1 tablet (5 mg total) by mouth 3 (three) times daily.   cyclobenzaprine  10 MG tablet Commonly known as: FLEXERIL  Take 1 tablet (10 mg total) by mouth 3 (three) times daily. What changed:  when to take this reasons to take this   famotidine  40 MG tablet Commonly known as: PEPCID  Take 1 tablet (40 mg total) by mouth daily.   gabapentin  100 MG capsule Commonly known as: NEURONTIN  Take 100 mg by mouth 2 (two) times daily with a meal.   Geri-kot 8.6 MG tablet Generic drug: senna Take 1 tablet (8.6 mg total) by mouth daily.   HYDROcodone -acetaminophen  10-325 MG tablet--Rx # 28 pills.  Commonly  known as: NORCO Take 1 tablet by mouth every 6 (six) hours as needed.   levothyroxine  125 MCG tablet Commonly known as: SYNTHROID  Take 125 mcg by mouth daily before breakfast.   lidocaine  5 % Commonly known as: LIDODERM  Place 2 patches onto the skin daily. Remove & Discard patch within 12 hours or as directed by MD   melatonin 5 MG Tabs Take 1 tablet (5 mg total) by mouth at bedtime.   Multivitamin Women 50+ Tabs Take 1 tablet by mouth daily.   omeprazole  40 MG capsule Commonly known as: PRILOSEC TAKE 1 CAPSULE BY MOUTH DAILY   ondansetron  4 MG tablet Commonly known as: ZOFRAN  TAKE 1 TABLET BY MOUTH EVERY 8 HOURS AS NEEDED FOR NAUSEA OR VOMITING   protein supplement Powd Take 6 g by mouth 3 (three) times daily with meals.   spironolactone  25 MG tablet Commonly known as: ALDACTONE  Take 25 mg by mouth daily.   Trelegy Ellipta 100-62.5-25 MCG/ACT Aepb Generic drug: Fluticasone -Umeclidin-Vilant Inhale 1 puff into the lungs every other day.   triamcinolone  cream 0.1 % Commonly known as: KENALOG  Apply 1 Application topically 2 (two) times daily as needed (skin irritation).   Vitamin D  (Ergocalciferol ) 1.25 MG (50000 UNIT) Caps capsule Commonly known as: DRISDOL  Take 1 capsule (50,000 Units total) by mouth every 7 (seven) days.   VITAMIN D -3 PO Take 1 capsule by mouth daily.   Zinc  Sulfate 220 (50 Zn) MG Tabs Take 1 tablet (220 mg total) by mouth daily with supper.        Follow-up Information     Trudy Vaughn FALCON, MD. Call.   Specialty: Internal Medicine Why: Monday for follow up appt Contact information: [address] [place] [address] [phone]         Celena Sharper, MD. Call.   Specialty: Orthopedic Surgery Why: Monday for follow up appointment/Leg fracture Contact information: [address] [place] [address] [phone]         Cornelio Bouchard, MD. Call.   Specialty: Physical Medicine and Rehabilitation Why: As  needed Contact  information: [address] [place] [address] [phone]         Debby Dorn MATSU, MD. Call.   Specialty: Neurosurgery Why: Monday for post hospital follow up/back Contact information: [address] [place] [address] [phone]                 Signed: Sharlet GORMAN Schmitz 07/04/2024, 10:07 PM

## 2024-07-06 ENCOUNTER — Other Ambulatory Visit (INDEPENDENT_AMBULATORY_CARE_PROVIDER_SITE_OTHER): Payer: Self-pay | Admitting: Gastroenterology

## 2024-07-06 DIAGNOSIS — J4489 Other specified chronic obstructive pulmonary disease: Secondary | ICD-10-CM | POA: Diagnosis not present

## 2024-07-06 DIAGNOSIS — M797 Fibromyalgia: Secondary | ICD-10-CM | POA: Diagnosis not present

## 2024-07-06 DIAGNOSIS — K589 Irritable bowel syndrome without diarrhea: Secondary | ICD-10-CM | POA: Diagnosis not present

## 2024-07-06 DIAGNOSIS — D62 Acute posthemorrhagic anemia: Secondary | ICD-10-CM | POA: Diagnosis not present

## 2024-07-06 DIAGNOSIS — E1140 Type 2 diabetes mellitus with diabetic neuropathy, unspecified: Secondary | ICD-10-CM | POA: Diagnosis not present

## 2024-07-06 DIAGNOSIS — E039 Hypothyroidism, unspecified: Secondary | ICD-10-CM | POA: Diagnosis not present

## 2024-07-06 DIAGNOSIS — M5416 Radiculopathy, lumbar region: Secondary | ICD-10-CM | POA: Diagnosis not present

## 2024-07-06 DIAGNOSIS — M48061 Spinal stenosis, lumbar region without neurogenic claudication: Secondary | ICD-10-CM | POA: Diagnosis not present

## 2024-07-06 DIAGNOSIS — G8929 Other chronic pain: Secondary | ICD-10-CM | POA: Diagnosis not present

## 2024-07-06 DIAGNOSIS — K219 Gastro-esophageal reflux disease without esophagitis: Secondary | ICD-10-CM | POA: Diagnosis not present

## 2024-07-06 DIAGNOSIS — I272 Pulmonary hypertension, unspecified: Secondary | ICD-10-CM | POA: Diagnosis not present

## 2024-07-06 DIAGNOSIS — S22089D Unspecified fracture of T11-T12 vertebra, subsequent encounter for fracture with routine healing: Secondary | ICD-10-CM | POA: Diagnosis not present

## 2024-07-06 DIAGNOSIS — M791 Myalgia, unspecified site: Secondary | ICD-10-CM | POA: Diagnosis not present

## 2024-07-06 DIAGNOSIS — S72002D Fracture of unspecified part of neck of left femur, subsequent encounter for closed fracture with routine healing: Secondary | ICD-10-CM | POA: Diagnosis not present

## 2024-07-06 DIAGNOSIS — H409 Unspecified glaucoma: Secondary | ICD-10-CM | POA: Diagnosis not present

## 2024-07-06 DIAGNOSIS — G4700 Insomnia, unspecified: Secondary | ICD-10-CM | POA: Diagnosis not present

## 2024-07-06 DIAGNOSIS — D709 Neutropenia, unspecified: Secondary | ICD-10-CM | POA: Diagnosis not present

## 2024-07-06 DIAGNOSIS — K7581 Nonalcoholic steatohepatitis (NASH): Secondary | ICD-10-CM | POA: Diagnosis not present

## 2024-07-06 DIAGNOSIS — D696 Thrombocytopenia, unspecified: Secondary | ICD-10-CM | POA: Diagnosis not present

## 2024-07-06 DIAGNOSIS — I110 Hypertensive heart disease with heart failure: Secondary | ICD-10-CM | POA: Diagnosis not present

## 2024-07-09 DIAGNOSIS — E039 Hypothyroidism, unspecified: Secondary | ICD-10-CM | POA: Diagnosis not present

## 2024-07-09 DIAGNOSIS — E119 Type 2 diabetes mellitus without complications: Secondary | ICD-10-CM | POA: Diagnosis not present

## 2024-07-09 DIAGNOSIS — D72819 Decreased white blood cell count, unspecified: Secondary | ICD-10-CM | POA: Diagnosis not present

## 2024-07-11 DIAGNOSIS — S72352D Displaced comminuted fracture of shaft of left femur, subsequent encounter for closed fracture with routine healing: Secondary | ICD-10-CM | POA: Diagnosis not present

## 2024-07-16 DIAGNOSIS — Z981 Arthrodesis status: Secondary | ICD-10-CM | POA: Diagnosis not present

## 2024-07-17 DIAGNOSIS — E1140 Type 2 diabetes mellitus with diabetic neuropathy, unspecified: Secondary | ICD-10-CM | POA: Diagnosis not present

## 2024-07-17 DIAGNOSIS — K219 Gastro-esophageal reflux disease without esophagitis: Secondary | ICD-10-CM | POA: Diagnosis not present

## 2024-07-17 DIAGNOSIS — M791 Myalgia, unspecified site: Secondary | ICD-10-CM | POA: Diagnosis not present

## 2024-07-17 DIAGNOSIS — J4489 Other specified chronic obstructive pulmonary disease: Secondary | ICD-10-CM | POA: Diagnosis not present

## 2024-07-17 DIAGNOSIS — S22089D Unspecified fracture of T11-T12 vertebra, subsequent encounter for fracture with routine healing: Secondary | ICD-10-CM | POA: Diagnosis not present

## 2024-07-17 DIAGNOSIS — I272 Pulmonary hypertension, unspecified: Secondary | ICD-10-CM | POA: Diagnosis not present

## 2024-07-17 DIAGNOSIS — S72002D Fracture of unspecified part of neck of left femur, subsequent encounter for closed fracture with routine healing: Secondary | ICD-10-CM | POA: Diagnosis not present

## 2024-07-17 DIAGNOSIS — I2510 Atherosclerotic heart disease of native coronary artery without angina pectoris: Secondary | ICD-10-CM | POA: Diagnosis not present

## 2024-07-17 DIAGNOSIS — D62 Acute posthemorrhagic anemia: Secondary | ICD-10-CM | POA: Diagnosis not present

## 2024-07-17 DIAGNOSIS — D709 Neutropenia, unspecified: Secondary | ICD-10-CM | POA: Diagnosis not present

## 2024-07-17 DIAGNOSIS — G4700 Insomnia, unspecified: Secondary | ICD-10-CM | POA: Diagnosis not present

## 2024-07-17 DIAGNOSIS — D696 Thrombocytopenia, unspecified: Secondary | ICD-10-CM | POA: Diagnosis not present

## 2024-07-17 DIAGNOSIS — G8929 Other chronic pain: Secondary | ICD-10-CM | POA: Diagnosis not present

## 2024-07-17 DIAGNOSIS — I5032 Chronic diastolic (congestive) heart failure: Secondary | ICD-10-CM | POA: Diagnosis not present

## 2024-07-17 DIAGNOSIS — M797 Fibromyalgia: Secondary | ICD-10-CM | POA: Diagnosis not present

## 2024-07-17 DIAGNOSIS — H409 Unspecified glaucoma: Secondary | ICD-10-CM | POA: Diagnosis not present

## 2024-07-17 DIAGNOSIS — M5416 Radiculopathy, lumbar region: Secondary | ICD-10-CM | POA: Diagnosis not present

## 2024-07-17 DIAGNOSIS — K589 Irritable bowel syndrome without diarrhea: Secondary | ICD-10-CM | POA: Diagnosis not present

## 2024-07-17 DIAGNOSIS — I110 Hypertensive heart disease with heart failure: Secondary | ICD-10-CM | POA: Diagnosis not present

## 2024-07-17 DIAGNOSIS — K7581 Nonalcoholic steatohepatitis (NASH): Secondary | ICD-10-CM | POA: Diagnosis not present

## 2024-07-17 DIAGNOSIS — M48061 Spinal stenosis, lumbar region without neurogenic claudication: Secondary | ICD-10-CM | POA: Diagnosis not present

## 2024-07-19 ENCOUNTER — Other Ambulatory Visit: Payer: Self-pay

## 2024-07-19 DIAGNOSIS — E039 Hypothyroidism, unspecified: Secondary | ICD-10-CM | POA: Diagnosis not present

## 2024-07-20 DIAGNOSIS — E039 Hypothyroidism, unspecified: Secondary | ICD-10-CM | POA: Diagnosis not present

## 2024-07-20 DIAGNOSIS — E119 Type 2 diabetes mellitus without complications: Secondary | ICD-10-CM | POA: Diagnosis not present

## 2024-07-20 DIAGNOSIS — K7460 Unspecified cirrhosis of liver: Secondary | ICD-10-CM | POA: Diagnosis not present

## 2024-07-20 DIAGNOSIS — D649 Anemia, unspecified: Secondary | ICD-10-CM | POA: Diagnosis not present

## 2024-07-20 DIAGNOSIS — D72819 Decreased white blood cell count, unspecified: Secondary | ICD-10-CM | POA: Diagnosis not present

## 2024-07-24 ENCOUNTER — Inpatient Hospital Stay: Attending: Oncology

## 2024-07-24 DIAGNOSIS — C9110 Chronic lymphocytic leukemia of B-cell type not having achieved remission: Secondary | ICD-10-CM | POA: Diagnosis not present

## 2024-07-24 DIAGNOSIS — I5032 Chronic diastolic (congestive) heart failure: Secondary | ICD-10-CM | POA: Diagnosis not present

## 2024-07-24 DIAGNOSIS — K7460 Unspecified cirrhosis of liver: Secondary | ICD-10-CM | POA: Insufficient documentation

## 2024-07-24 DIAGNOSIS — Z882 Allergy status to sulfonamides status: Secondary | ICD-10-CM | POA: Insufficient documentation

## 2024-07-24 DIAGNOSIS — Z79899 Other long term (current) drug therapy: Secondary | ICD-10-CM | POA: Diagnosis not present

## 2024-07-24 DIAGNOSIS — I110 Hypertensive heart disease with heart failure: Secondary | ICD-10-CM | POA: Diagnosis not present

## 2024-07-24 DIAGNOSIS — Z8701 Personal history of pneumonia (recurrent): Secondary | ICD-10-CM | POA: Insufficient documentation

## 2024-07-24 DIAGNOSIS — R5383 Other fatigue: Secondary | ICD-10-CM | POA: Diagnosis not present

## 2024-07-24 DIAGNOSIS — Z5986 Financial insecurity: Secondary | ICD-10-CM | POA: Insufficient documentation

## 2024-07-24 DIAGNOSIS — Z90711 Acquired absence of uterus with remaining cervical stump: Secondary | ICD-10-CM | POA: Insufficient documentation

## 2024-07-24 DIAGNOSIS — Z8719 Personal history of other diseases of the digestive system: Secondary | ICD-10-CM | POA: Diagnosis not present

## 2024-07-24 DIAGNOSIS — R197 Diarrhea, unspecified: Secondary | ICD-10-CM | POA: Diagnosis not present

## 2024-07-24 DIAGNOSIS — E538 Deficiency of other specified B group vitamins: Secondary | ICD-10-CM | POA: Diagnosis not present

## 2024-07-24 DIAGNOSIS — D61818 Other pancytopenia: Secondary | ICD-10-CM | POA: Insufficient documentation

## 2024-07-24 DIAGNOSIS — M549 Dorsalgia, unspecified: Secondary | ICD-10-CM | POA: Diagnosis not present

## 2024-07-24 DIAGNOSIS — R1030 Lower abdominal pain, unspecified: Secondary | ICD-10-CM | POA: Diagnosis not present

## 2024-07-24 DIAGNOSIS — D696 Thrombocytopenia, unspecified: Secondary | ICD-10-CM | POA: Insufficient documentation

## 2024-07-24 DIAGNOSIS — Z79890 Hormone replacement therapy (postmenopausal): Secondary | ICD-10-CM | POA: Insufficient documentation

## 2024-07-24 DIAGNOSIS — E639 Nutritional deficiency, unspecified: Secondary | ICD-10-CM | POA: Insufficient documentation

## 2024-07-24 DIAGNOSIS — R42 Dizziness and giddiness: Secondary | ICD-10-CM | POA: Diagnosis not present

## 2024-07-24 DIAGNOSIS — Z800 Family history of malignant neoplasm of digestive organs: Secondary | ICD-10-CM | POA: Insufficient documentation

## 2024-07-24 DIAGNOSIS — R200 Anesthesia of skin: Secondary | ICD-10-CM | POA: Diagnosis not present

## 2024-07-24 DIAGNOSIS — K7581 Nonalcoholic steatohepatitis (NASH): Secondary | ICD-10-CM | POA: Diagnosis not present

## 2024-07-24 DIAGNOSIS — Z888 Allergy status to other drugs, medicaments and biological substances status: Secondary | ICD-10-CM | POA: Insufficient documentation

## 2024-07-24 DIAGNOSIS — E610 Copper deficiency: Secondary | ICD-10-CM | POA: Insufficient documentation

## 2024-07-24 DIAGNOSIS — Z8673 Personal history of transient ischemic attack (TIA), and cerebral infarction without residual deficits: Secondary | ICD-10-CM | POA: Insufficient documentation

## 2024-07-24 DIAGNOSIS — Z885 Allergy status to narcotic agent status: Secondary | ICD-10-CM | POA: Insufficient documentation

## 2024-07-24 DIAGNOSIS — Z9049 Acquired absence of other specified parts of digestive tract: Secondary | ICD-10-CM | POA: Insufficient documentation

## 2024-07-24 DIAGNOSIS — Z809 Family history of malignant neoplasm, unspecified: Secondary | ICD-10-CM | POA: Insufficient documentation

## 2024-07-24 DIAGNOSIS — Z886 Allergy status to analgesic agent status: Secondary | ICD-10-CM | POA: Insufficient documentation

## 2024-07-24 DIAGNOSIS — Z8249 Family history of ischemic heart disease and other diseases of the circulatory system: Secondary | ICD-10-CM | POA: Insufficient documentation

## 2024-07-24 DIAGNOSIS — Z90722 Acquired absence of ovaries, bilateral: Secondary | ICD-10-CM | POA: Insufficient documentation

## 2024-07-24 LAB — COMPREHENSIVE METABOLIC PANEL WITH GFR
ALT: 27 U/L (ref 0–44)
AST: 43 U/L — ABNORMAL HIGH (ref 15–41)
Albumin: 3.7 g/dL (ref 3.5–5.0)
Alkaline Phosphatase: 159 U/L — ABNORMAL HIGH (ref 38–126)
Anion gap: 10 (ref 5–15)
BUN: 14 mg/dL (ref 8–23)
CO2: 20 mmol/L — ABNORMAL LOW (ref 22–32)
Calcium: 8.4 mg/dL — ABNORMAL LOW (ref 8.9–10.3)
Chloride: 109 mmol/L (ref 98–111)
Creatinine, Ser: 0.93 mg/dL (ref 0.44–1.00)
GFR, Estimated: >60 mL/min (ref >60)
Glucose, Bld: 81 mg/dL (ref 70–99)
Potassium: 4.7 mmol/L (ref 3.5–5.1)
Sodium: 139 mmol/L (ref 135–145)
Total Bilirubin: 1.3 mg/dL — ABNORMAL HIGH (ref 0.0–1.2)
Total Protein: 6.9 g/dL (ref 6.5–8.1)

## 2024-07-24 LAB — CBC WITH DIFFERENTIAL/PLATELET
Abs Immature Granulocytes: 0 K/uL (ref 0.00–0.07)
Basophils Absolute: 0 K/uL (ref 0.0–0.1)
Basophils Relative: 0 %
Eosinophils Absolute: 0.1 K/uL (ref 0.0–0.5)
Eosinophils Relative: 3 %
HCT: 37.7 % (ref 36.0–46.0)
Hemoglobin: 12.7 g/dL (ref 12.0–15.0)
Immature Granulocytes: 0 %
Lymphocytes Relative: 47 %
Lymphs Abs: 1.5 K/uL (ref 0.7–4.0)
MCH: 34.9 pg — ABNORMAL HIGH (ref 26.0–34.0)
MCHC: 33.7 g/dL (ref 30.0–36.0)
MCV: 103.6 fL — ABNORMAL HIGH (ref 80.0–100.0)
Monocytes Absolute: 0.2 K/uL (ref 0.1–1.0)
Monocytes Relative: 7 %
Neutro Abs: 1.4 K/uL — ABNORMAL LOW (ref 1.7–7.7)
Neutrophils Relative %: 43 %
Platelets: 146 K/uL — ABNORMAL LOW (ref 150–400)
RBC: 3.64 MIL/uL — ABNORMAL LOW (ref 3.87–5.11)
RDW: 14.6 % (ref 11.5–15.5)
WBC: 3.3 K/uL — ABNORMAL LOW (ref 4.0–10.5)
nRBC: 0 % (ref 0.0–0.2)

## 2024-07-24 LAB — FERRITIN: Ferritin: 188 ng/mL (ref 11–307)

## 2024-07-24 LAB — IRON AND TIBC
Iron: 90 ug/dL (ref 28–170)
Saturation Ratios: 25 % (ref 10.4–31.8)
TIBC: 368 ug/dL (ref 250–450)
UIBC: 278 ug/dL

## 2024-07-24 LAB — FOLATE: Folate: 14.8 ng/mL (ref >5.9)

## 2024-07-24 LAB — VITAMIN B12: Vitamin B-12: 1436 pg/mL — ABNORMAL HIGH (ref 180–914)

## 2024-07-25 DIAGNOSIS — D709 Neutropenia, unspecified: Secondary | ICD-10-CM | POA: Diagnosis not present

## 2024-07-25 DIAGNOSIS — K219 Gastro-esophageal reflux disease without esophagitis: Secondary | ICD-10-CM | POA: Diagnosis not present

## 2024-07-25 DIAGNOSIS — D696 Thrombocytopenia, unspecified: Secondary | ICD-10-CM | POA: Diagnosis not present

## 2024-07-25 DIAGNOSIS — I272 Pulmonary hypertension, unspecified: Secondary | ICD-10-CM | POA: Diagnosis not present

## 2024-07-25 DIAGNOSIS — G4700 Insomnia, unspecified: Secondary | ICD-10-CM | POA: Diagnosis not present

## 2024-07-25 DIAGNOSIS — K7581 Nonalcoholic steatohepatitis (NASH): Secondary | ICD-10-CM | POA: Diagnosis not present

## 2024-07-25 DIAGNOSIS — K589 Irritable bowel syndrome without diarrhea: Secondary | ICD-10-CM | POA: Diagnosis not present

## 2024-07-25 DIAGNOSIS — I5032 Chronic diastolic (congestive) heart failure: Secondary | ICD-10-CM | POA: Diagnosis not present

## 2024-07-25 DIAGNOSIS — J4489 Other specified chronic obstructive pulmonary disease: Secondary | ICD-10-CM | POA: Diagnosis not present

## 2024-07-25 DIAGNOSIS — E039 Hypothyroidism, unspecified: Secondary | ICD-10-CM | POA: Diagnosis not present

## 2024-07-25 DIAGNOSIS — D62 Acute posthemorrhagic anemia: Secondary | ICD-10-CM | POA: Diagnosis not present

## 2024-07-25 DIAGNOSIS — S72002D Fracture of unspecified part of neck of left femur, subsequent encounter for closed fracture with routine healing: Secondary | ICD-10-CM | POA: Diagnosis not present

## 2024-07-25 DIAGNOSIS — M797 Fibromyalgia: Secondary | ICD-10-CM | POA: Diagnosis not present

## 2024-07-25 DIAGNOSIS — M48061 Spinal stenosis, lumbar region without neurogenic claudication: Secondary | ICD-10-CM | POA: Diagnosis not present

## 2024-07-25 DIAGNOSIS — H409 Unspecified glaucoma: Secondary | ICD-10-CM | POA: Diagnosis not present

## 2024-07-25 DIAGNOSIS — M5416 Radiculopathy, lumbar region: Secondary | ICD-10-CM | POA: Diagnosis not present

## 2024-07-25 DIAGNOSIS — G8929 Other chronic pain: Secondary | ICD-10-CM | POA: Diagnosis not present

## 2024-07-25 DIAGNOSIS — S22089D Unspecified fracture of T11-T12 vertebra, subsequent encounter for fracture with routine healing: Secondary | ICD-10-CM | POA: Diagnosis not present

## 2024-07-25 DIAGNOSIS — E1140 Type 2 diabetes mellitus with diabetic neuropathy, unspecified: Secondary | ICD-10-CM | POA: Diagnosis not present

## 2024-07-25 DIAGNOSIS — M791 Myalgia, unspecified site: Secondary | ICD-10-CM | POA: Diagnosis not present

## 2024-07-25 DIAGNOSIS — I2510 Atherosclerotic heart disease of native coronary artery without angina pectoris: Secondary | ICD-10-CM | POA: Diagnosis not present

## 2024-07-25 DIAGNOSIS — I110 Hypertensive heart disease with heart failure: Secondary | ICD-10-CM | POA: Diagnosis not present

## 2024-07-26 DIAGNOSIS — K7460 Unspecified cirrhosis of liver: Secondary | ICD-10-CM | POA: Diagnosis not present

## 2024-07-26 DIAGNOSIS — Z6823 Body mass index (BMI) 23.0-23.9, adult: Secondary | ICD-10-CM | POA: Diagnosis not present

## 2024-07-26 DIAGNOSIS — H3530 Unspecified macular degeneration: Secondary | ICD-10-CM | POA: Diagnosis not present

## 2024-07-26 DIAGNOSIS — Z8679 Personal history of other diseases of the circulatory system: Secondary | ICD-10-CM | POA: Diagnosis not present

## 2024-07-26 DIAGNOSIS — R079 Chest pain, unspecified: Secondary | ICD-10-CM | POA: Diagnosis not present

## 2024-07-26 LAB — COPPER, SERUM: Copper: 105 ug/dL (ref 80–158)

## 2024-07-30 DIAGNOSIS — K589 Irritable bowel syndrome without diarrhea: Secondary | ICD-10-CM | POA: Diagnosis not present

## 2024-07-30 DIAGNOSIS — D696 Thrombocytopenia, unspecified: Secondary | ICD-10-CM | POA: Diagnosis not present

## 2024-07-30 DIAGNOSIS — D62 Acute posthemorrhagic anemia: Secondary | ICD-10-CM | POA: Diagnosis not present

## 2024-07-30 DIAGNOSIS — M5416 Radiculopathy, lumbar region: Secondary | ICD-10-CM | POA: Diagnosis not present

## 2024-07-30 DIAGNOSIS — D709 Neutropenia, unspecified: Secondary | ICD-10-CM | POA: Diagnosis not present

## 2024-07-30 DIAGNOSIS — E1140 Type 2 diabetes mellitus with diabetic neuropathy, unspecified: Secondary | ICD-10-CM | POA: Diagnosis not present

## 2024-07-30 DIAGNOSIS — Z8679 Personal history of other diseases of the circulatory system: Secondary | ICD-10-CM | POA: Diagnosis not present

## 2024-07-30 DIAGNOSIS — E039 Hypothyroidism, unspecified: Secondary | ICD-10-CM | POA: Diagnosis not present

## 2024-07-30 DIAGNOSIS — K7581 Nonalcoholic steatohepatitis (NASH): Secondary | ICD-10-CM | POA: Diagnosis not present

## 2024-07-30 DIAGNOSIS — H3530 Unspecified macular degeneration: Secondary | ICD-10-CM | POA: Diagnosis not present

## 2024-07-30 DIAGNOSIS — M791 Myalgia, unspecified site: Secondary | ICD-10-CM | POA: Diagnosis not present

## 2024-07-30 DIAGNOSIS — K219 Gastro-esophageal reflux disease without esophagitis: Secondary | ICD-10-CM | POA: Diagnosis not present

## 2024-07-30 DIAGNOSIS — R079 Chest pain, unspecified: Secondary | ICD-10-CM | POA: Diagnosis not present

## 2024-07-30 DIAGNOSIS — H409 Unspecified glaucoma: Secondary | ICD-10-CM | POA: Diagnosis not present

## 2024-07-30 DIAGNOSIS — I110 Hypertensive heart disease with heart failure: Secondary | ICD-10-CM | POA: Diagnosis not present

## 2024-07-30 DIAGNOSIS — M48061 Spinal stenosis, lumbar region without neurogenic claudication: Secondary | ICD-10-CM | POA: Diagnosis not present

## 2024-07-30 DIAGNOSIS — M797 Fibromyalgia: Secondary | ICD-10-CM | POA: Diagnosis not present

## 2024-07-30 DIAGNOSIS — I5032 Chronic diastolic (congestive) heart failure: Secondary | ICD-10-CM | POA: Diagnosis not present

## 2024-07-30 DIAGNOSIS — G8929 Other chronic pain: Secondary | ICD-10-CM | POA: Diagnosis not present

## 2024-07-30 DIAGNOSIS — S72002D Fracture of unspecified part of neck of left femur, subsequent encounter for closed fracture with routine healing: Secondary | ICD-10-CM | POA: Diagnosis not present

## 2024-07-30 DIAGNOSIS — I272 Pulmonary hypertension, unspecified: Secondary | ICD-10-CM | POA: Diagnosis not present

## 2024-07-30 DIAGNOSIS — J4489 Other specified chronic obstructive pulmonary disease: Secondary | ICD-10-CM | POA: Diagnosis not present

## 2024-07-30 DIAGNOSIS — K7460 Unspecified cirrhosis of liver: Secondary | ICD-10-CM | POA: Diagnosis not present

## 2024-07-30 DIAGNOSIS — G4700 Insomnia, unspecified: Secondary | ICD-10-CM | POA: Diagnosis not present

## 2024-07-30 DIAGNOSIS — I2510 Atherosclerotic heart disease of native coronary artery without angina pectoris: Secondary | ICD-10-CM | POA: Diagnosis not present

## 2024-07-30 DIAGNOSIS — S22089D Unspecified fracture of T11-T12 vertebra, subsequent encounter for fracture with routine healing: Secondary | ICD-10-CM | POA: Diagnosis not present

## 2024-07-31 ENCOUNTER — Ambulatory Visit: Admitting: Physician Assistant

## 2024-08-01 NOTE — Progress Notes (Unsigned)
 [facility] [address], [place] [address]   CLINIC:  Medical Oncology/Hematology  PCP:  Trudy Vaughn FALCON, MD [address] [place] [address] [phone]   REASON FOR VISIT:  Follow-up for thrombocytopenia + monoclonal B-cell disorder  CURRENT THERAPY: Copper  + B12 supplementation  INTERVAL HISTORY:   Kimberly [name] 68 y.o. female returns for routine follow-up of thrombocytopenia in the setting of liver cirrhosis and nutritional deficiencies.  She was last seen by Dr. Davonna on 04/30/2024. In the interim since last visit, she was hospitalized from 06/09/2024 through 06/18/2024 for fall resulting in left femur fracture, presacral hematoma, and T12 vertebral fracture.  This was followed by inpatient rehab, with discharge home on 06/29/2024.  At today's visit, she reports feeling fair, and is slowly improving after her hospital stay. She does have some ongoing fatigue, but this is gradually improving after hospital discharge.  She reports easy bruising, but denies any major bleeding events. She denies any current lymphadenopathy, but reports that earlier this summer (prior to her hospitalization) she felt that there might have been some enlarged lymph nodes in her bilateral neck.  No associated infections.  She reports that they feel normal now. She  has not had any fever, chills, night sweats, or unintentional weight loss.   She denies any recent infections.  She reports decreased appetite and intermittent lower abdominal pain ever since her weight loss surgery in June 2020.  (She had uncomplicated laparoscopic biliopancreatic diversion duodenal switch on 04/23/2019).  She continues to take copper  2 mg daily and vitamin B12 1000 mcg daily.  She has 60% energy and 75% appetite.  She endorses that she is maintaining a stable weight.  ASSESSMENT & PLAN:  1.  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  11% of lymphocytes (32% of B cells) shows CD5, CD19, dim CD20, CD200 positive B-cell population that is dim kappa restricted.  The results are consistent with monoclonal B-cell lymphocytosis (low count) with CLL immunophenotype - CT CAP (obtained after fall on 06/08/2024): No mediastinal lymphadenopathy.  No abdominopelvic lymphadenopathy. - Physical exam (08/02/2024) limited by patient's TSO brace and inability to get up from chair.  No head/neck, axillary, or inguinal lymphadenopathy appreciated.   - No B symptoms.  No recent infections. - Most recent CBC/D (07/24/2024): Absolute lymphocytes normal at 1.5.  WBC 3.3 with ANC 1.4.  Platelets 146.  Hgb 12.7. - DISCUSSION: Patient does not meet criteria for CLL at this time, which requires presence of  > 5.0 absolute lymphocyte count in peripheral blood for at least 3 months.  However, immunophenotype is characteristic for CLL.  She may have some splenic sequestration of lymphocytes in the setting of cirrhosis-associated splenomegaly, which may cause peripheral blood absolute lymphocytes to appear lower than they are. - PLAN: Discussed the diagnosis of monoclonal B-cell lymphocytosis vs. early CLL with patient, particularly that she does not require treatment at this time but that we will continue close surveillance.  RTC in 3 months with repeat CBC/D and LDH, followed by MD visit with Dr. Davonna.  2.  Pancytopenia (resolved), with residual THROMBOCYTOPENIA - Initially referred for pancytopenia, but this was resolved at the time of repeat labs, with only mild thrombocytopenia remaining - Likely secondary to nutritional deficiencies and cirrhosis - Workup showed copper  deficiency and mild B12 deficiency (see below). - Most recent CBC/D (07/24/2024): Hgb 12.7/MCV 103.6.  WBC 3.3/ANC 1.4.  Platelets 146. Copper  and B12 addressed below.   Normal folate. Ferritin 188, iron saturation 25% - PLAN:   Continue nutritional supplementation as below.  Continue surveillance - Continue follow-up with GI.  3.  Copper  deficiency - Initial labs showed copper  deficiency with significantly low level of 35 - She was started on copper  2 mg daily in June 2025  - Most recent labs (07/24/2024): Normalized copper  105 - PLAN: Continue copper  2 mg but DECREASE TO EVERY OTHER DAY  4.  Vitamin B12 deficiency - Initial labs showed mild B12 deficiency with levels <400 - She is taking vitamin B12 1000 mcg daily - Most recent labs (07/24/2024): Vitamin B12 improved 1436. - PLAN:  DECREASE vitamin B12 to take 1000 mcg EVERY OTHER DAY.  5.  Liver cirrhosis secondary to NASH - Patient has a history of liver cirrhosis secondary to NASH - Being followed by GI - AFP normal.   - Abdominal ultrasound 04/19/2024: Consistent with liver cirrhosis - PLAN: Continue to follow with GI  PLAN SUMMARY: >> Labs in 3 months = CBC/D, CMP, LDH, copper , B12/MMA >> MD VISIT with DR. KANDALA in 3 months (after labs)  **Alternating MD/APP visits     REVIEW OF SYSTEMS:   Review of Systems  Constitutional:  Positive for fatigue. Negative for appetite change, chills, diaphoresis, fever and unexpected weight change.  HENT:   Negative for lump/mass and nosebleeds.   Eyes:  Negative for eye problems.  Respiratory:  Positive for cough (dry). Negative for hemoptysis and shortness of breath.   Cardiovascular:  Negative for chest pain, leg swelling and palpitations.  Gastrointestinal:  Positive for diarrhea (at times). Negative for abdominal pain, blood in stool, constipation, nausea and vomiting.  Genitourinary:  Negative for hematuria.   Musculoskeletal:  Positive for back pain.  Skin: Negative.   Neurological:  Positive for dizziness and numbness. Negative for headaches and light-headedness.  Hematological:  Does not bruise/bleed easily.  Psychiatric/Behavioral:  The patient is nervous/anxious.      PHYSICAL EXAM:  ECOG  PERFORMANCE STATUS: 2 - Symptomatic, <50% confined to bed  Vitals:   08/02/24 1046  BP: 103/70  Pulse: 71  Resp: 17  Temp: 98.3 F (36.8 C)  SpO2: 100%   Filed Weights   08/02/24 1046  Weight: 130 lb 14.4 oz (59.4 kg)   Physical Exam Constitutional:      Appearance: Normal appearance. She is normal weight.     Comments: TSO brace in place  Cardiovascular:     Heart sounds: Normal heart sounds.  Pulmonary:     Breath sounds: Normal breath sounds.  Lymphadenopathy:     Comments: No head/neck, axillary, or inguinal lymphadenopathy.  Neurological:     General: No focal deficit present.     Mental Status: Mental status is at baseline.  Psychiatric:        Behavior: Behavior normal. Behavior is cooperative.     PAST MEDICAL/SURGICAL HISTORY:  Past Medical History:  Diagnosis Date   Anemia    PMH: as a child   Arthritis    Asthma    CAD (coronary artery disease)    nonobstructive by cath, 6/08 (false positive Cardiolite ) normal stress echo, 7/11   CHF (congestive heart failure) (HCC)    Chronic back pain    Chronic bronchitis (HCC)    Cirrhosis (HCC)    Complication of anesthesia    had an asthma attack when woke up from procedure   Complication of anesthesia    asthma attack  with one surgery   COPD (chronic obstructive pulmonary disease) (HCC)    Degenerative joint disease    Depression    DJD (degenerative joint disease)    Fatty liver    Fibromyalgia    GERD (gastroesophageal reflux disease)    Glaucoma    Headache(784.0)    Heart failure, diastolic, chronic (HCC)    Heart murmur    PMH:As a child only   Hernia of abdominal cavity    upper and lower hernia   History of hiatal hernia    HTN (hypertension)    Hypertension    Hypothyroidism    IBS (irritable bowel syndrome)    Morbid obesity (HCC)    Neuropathy    associated with diabetes   Obstructive sleep apnea    Pancreatitis    Pinched nerve in neck    Pneumonia    as a child   PONV  (postoperative nausea and vomiting)    Pulmonary hypertension (HCC)    Rheumatic fever    PMH: as a child   Seizures (HCC)    PMH: only as a child   Seizures (HCC)    in childhood   Shortness of breath    Sleep apnea    Spinal stenosis    Stroke (HCC)    12/09/2013   Past Surgical History:  Procedure Laterality Date   ABDOMINAL HYSTERECTOMY     ABDOMINAL HYSTERECTOMY     partial hysterectomy   APPENDECTOMY     BACK SURGERY     BACK SURGERY     disc surgery with complications and damage to right side   BARIATRIC SURGERY     DS in June 2020   BILATERAL KNEE ARTHROSCOPY  2000, 2009   BIOPSY  06/16/2021   Procedure: BIOPSY;  Surgeon: Eartha Angelia Sieving, MD;  Location: [facility];  Service: Gastroenterology;;   BREAST CYST EXCISION Left    CARDIAC CATHETERIZATION     2009   CATARACT EXTRACTION W/ INTRAOCULAR LENS  IMPLANT, BILATERAL     CHOLECYSTECTOMY  11/22/1992   CHOLECYSTECTOMY     COLONOSCOPY N/A 08/01/2013   Procedure: COLONOSCOPY;  Surgeon: Claudis RAYMOND Rivet, MD;  Location: [facility];  Service: Endoscopy;  Laterality: N/A;  930   COLONOSCOPY N/A 08/03/2017   Procedure: COLONOSCOPY;  Surgeon: Rivet Claudis RAYMOND, MD;  Location: [facility];  Service: Endoscopy;  Laterality: N/A;  12:00   COLONOSCOPY WITH PROPOFOL  N/A 06/28/2023   Procedure: COLONOSCOPY WITH PROPOFOL ;  Surgeon: Eartha Angelia Sieving, MD;  Location: [facility];  Service: Gastroenterology;  Laterality: N/A;  12:30pm;asa 3   CYST EXCISION     Left breast   ESOPHAGOGASTRODUODENOSCOPY (EGD) WITH PROPOFOL  N/A 06/16/2021   Procedure: ESOPHAGOGASTRODUODENOSCOPY (EGD) WITH PROPOFOL ;  Surgeon: Eartha Angelia Sieving, MD;  [facility]: [facility];  Service: Gastroenterology;  Laterality: N/A;  12:50   ESOPHAGOGASTRODUODENOSCOPY (EGD) WITH PROPOFOL  N/A 06/28/2023   Procedure: ESOPHAGOGASTRODUODENOSCOPY (EGD) WITH PROPOFOL ;  Surgeon: Eartha Angelia Sieving, MD;  [facility]: [facility];  Service:  Gastroenterology;  Laterality: N/A;  12:30pm;asa 3   EXCISIONAL HEMORRHOIDECTOMY     EYE SURGERY  11/22/1965   EYE SURGERY     bilateral cataract removal with lens implants   EYE SURGERY     growth removed from right eye lid   EYE SURGERY     bilateral eye surgery age 41 tto straighten eyes'   FEMUR IM NAIL Left 06/08/2024   Procedure: INSERTION, INTRAMEDULLARY ROD, FEMUR;  Surgeon: Celena,  Ozell, MD;  Location: [facility] OR;  Service: Orthopedics;  Laterality: Left;   heel (other)  11/23/2003   HEEL SPUR SURGERY     Right heel - growth removal and rebuilding of heel   HEMORRHOID SURGERY     HERNIA REPAIR     KNEE ARTHROSCOPY Bilateral    LUMBAR LAMINECTOMY/DECOMPRESSION MICRODISCECTOMY Right 08/23/2014   Procedure: LUMBAR LAMINECTOMY/DECOMPRESSION MICRODISCECTOMY 1 LEVEL;  Surgeon: Victory DELENA Gunnels, MD;  Location: [facility];  Service: Neurosurgery;  Laterality: Right;  LUMBAR LAMINECTOMY/DECOMPRESSION MICRODISCECTOMY 1 LEVEL LUMBAR 2-3   LUMBAR PERCUTANEOUS PEDICLE SCREW 2 LEVEL N/A 06/08/2024   Procedure: THORACIC ELEVEN-LUMBAR ONE PERCUTANEOUS PEDICLE SCREW;  Surgeon: Debby Dorn MATSU, MD;  Location: [facility] OR;  Service: Neurosurgery;  Laterality: N/A;  T11-L1 Percutaneous Fusion   ORIF FEMUR FRACTURE Left 06/08/2024   Procedure: OPEN REDUCTION INTERNAL FIXATION FEMORAL SHAFT FRACTURE;  Surgeon: Celena Ozell, MD;  Location: [facility] OR;  Service: Orthopedics;  Laterality: Left;   ovaries removed  11/22/2001   PARTIAL HYSTERECTOMY  11/23/1979   SHOULDER ARTHROSCOPY WITH DISTAL CLAVICLE RESECTION Left 08/06/2022   Procedure: SHOULDER ARTHROSCOPY WITH DISTAL CLAVICLE RESECTION;  Surgeon: Sharl Selinda Dover, MD;  Location: [facility] OR;  Service: Orthopedics;  Laterality: Left;   SHOULDER ARTHROSCOPY WITH ROTATOR CUFF REPAIR Left 08/06/2022   Procedure: SHOULDER ARTHROSCOPY WITH ROTATOR CUFF REPAIR;  Surgeon: Sharl Selinda Dover, MD;  Location[facility] OR;  Service: Orthopedics;  Laterality: Left;   SHOULDER  ARTHROSCOPY WITH SUBACROMIAL DECOMPRESSION Left 08/06/2022   Procedure: SHOULDER ARTHROSCOPY WITH SUBACROMIAL DECOMPRESSIONWITH EXTENSIVE DEBRIDEMENT;  Surgeon: Sharl Selinda Dover, MD;  Location: [facility] OR;  Service: Orthopedics;  Laterality: Left;   SPHINCTEROTOMY     spinahatomy  11/22/1990   TOTAL KNEE ARTHROPLASTY Right 11/05/2020   Procedure: RIGHT TOTAL KNEE ARTHROPLASTY;  Surgeon: Barbarann Oneil BROCKS, MD;  Location: [facility];  Service: Orthopedics;  Laterality: Right;   TUBAL LIGATION  11/22/1973   TUBAL LIGATION      SOCIAL HISTORY:  Social History   Socioeconomic History   Marital status: Married    Spouse name: Not on file   Number of children: Not on file   Years of education: Not on file   Highest education level: Not on file  Occupational History   Not on file  Tobacco Use   Smoking status: Never    Passive exposure: Never   Smokeless tobacco: Never  Vaping Use   Vaping status: Never Used  Substance and Sexual Activity   Alcohol  use: No   Drug use: No   Sexual activity: Not on file  Other Topics Concern   Not on file  Social History Narrative   ** Merged History Encounter **       Regularly exercises. Full Time.    Social Drivers of Corporate investment banker Strain: Low Risk  (06/29/2023)   Overall Financial Resource Strain (CARDIA)    Difficulty of Paying Living Expenses: Not very hard  Recent Concern: Financial Resource Strain - Medium Risk (06/29/2023)   Overall Financial Resource Strain (CARDIA)    Difficulty of Paying Living Expenses: Somewhat hard  Food Insecurity: Food Insecurity Present (06/09/2024)   Hunger Vital Sign    Worried About Running Out of Food in the Last Year: Sometimes true    Ran Out of Food in the Last Year: Sometimes true  Transportation Needs: No Transportation Needs (06/09/2024)   PRAPARE - Administrator, Civil Service (Medical): No    Lack of Transportation (Non-Medical):  No  Physical Activity: Not on file  Stress: Not on  file  Social Connections: Moderately Integrated (06/09/2024)   Social Connection and Isolation Panel    Frequency of Communication with Friends and Family: More than three times a week    Frequency of Social Gatherings with Friends and Family: More than three times a week    Attends Religious Services: More than 4 times per year    Active Member of [employer] or Organizations: No    Attends Banker Meetings: Never    Marital Status: Married  Catering manager Violence: Not At Risk (06/09/2024)   Humiliation, Afraid, Rape, and Kick questionnaire    Fear of Current or Ex-Partner: No    Emotionally Abused: No    Physically Abused: No    Sexually Abused: No    FAMILY HISTORY:  Family History  Problem Relation Age of Onset   Colon cancer Brother    Cancer Other    Heart failure Other    Breast cancer Neg Hx     CURRENT MEDICATIONS:  Outpatient Encounter Medications as of 08/02/2024  Medication Sig   cyclobenzaprine  (FLEXERIL ) 5 MG tablet Take 5 mg by mouth 3 (three) times daily as needed.   acetaminophen  (TYLENOL ) 500 MG tablet Take 500 mg by mouth 2 (two) times daily as needed for moderate pain (pain score 4-6), fever or headache.   albuterol  (VENTOLIN  HFA) 108 (90 Base) MCG/ACT inhaler Inhale 2 puffs into the lungs 4 (four) times daily as needed for shortness of breath.   B Complex-C (B-COMPLEX WITH VITAMIN C) tablet Take 1 tablet by mouth daily.   busPIRone  (BUSPAR ) 5 MG tablet Take 1 tablet (5 mg total) by mouth 3 (three) times daily.   Cholecalciferol  (VITAMIN D -3 PO) Take 1 capsule by mouth daily.   famotidine  (PEPCID ) 40 MG tablet Take 1 tablet (40 mg total) by mouth daily.   levothyroxine  (SYNTHROID ) 125 MCG tablet Take 125 mcg by mouth daily before breakfast.   lidocaine  (LIDODERM ) 5 % Place 2 patches onto the skin daily. Remove & Discard patch within 12 hours or as directed by MD   melatonin 5 MG TABS Take 1 tablet (5 mg total) by mouth at bedtime.   Multiple  Vitamins-Minerals (MULTIVITAMIN WOMEN 50+) TABS Take 1 tablet by mouth daily.   omeprazole  (PRILOSEC) 40 MG capsule TAKE 1 CAPSULE BY MOUTH DAILY   ondansetron  (ZOFRAN ) 4 MG tablet TAKE 1 TABLET BY MOUTH EVERY 8 HOURS AS NEEDED FOR NAUSEA OR VOMITING   protein supplement (RESOURCE BENEPROTEIN) POWD Take 6 g by mouth 3 (three) times daily with meals.   spironolactone  (ALDACTONE ) 25 MG tablet Take 25 mg by mouth daily.   TRELEGY ELLIPTA 100-62.5-25 MCG/ACT AEPB Inhale 1 puff into the lungs every other day.   triamcinolone  cream (KENALOG ) 0.1 % Apply 1 Application topically 2 (two) times daily as needed (skin irritation).   Vitamin D , Ergocalciferol , (DRISDOL ) 1.25 MG (50000 UNIT) CAPS capsule Take 1 capsule (50,000 Units total) by mouth every 7 (seven) days.   Zinc  Sulfate 220 (50 Zn) MG TABS Take 1 tablet (220 mg total) by mouth daily with supper.   [DISCONTINUED] cyclobenzaprine  (FLEXERIL ) 10 MG tablet Take 1 tablet (10 mg total) by mouth 3 (three) times daily.   [DISCONTINUED] gabapentin  (NEURONTIN ) 100 MG capsule Take 100 mg by mouth 2 (two) times daily with a meal.   [DISCONTINUED] HYDROcodone -acetaminophen  (NORCO) 10-325 MG tablet Take 1 tablet by mouth every 6 (six) hours as needed.   [  DISCONTINUED] senna (SENOKOT) 8.6 MG TABS tablet Take 1 tablet (8.6 mg total) by mouth daily.   No facility-administered encounter medications on file as of 08/02/2024.    ALLERGIES:  Allergies  Allergen Reactions   Aleve [Naproxen] Anaphylaxis, Swelling and Other (See Comments)    Throat closes   Cinnamon Anaphylaxis    Reaction caused by a medication interaction.   Other Shortness Of Breath and Other (See Comments)    Local anesthesia - triggers asthma attacks  08/06/22 tolerated amide local anesthetic in peripheral nerve block.   Roxicodone  [Oxycodone ] Other (See Comments)    Over sedation   Atrovent Nasal Spray [Ipratropium] Other (See Comments)    Epistaxis    Feldene [Piroxicam] Nausea Only    Flonase [Fluticasone ] Other (See Comments)    Epistaxis    Lyrica [Pregabalin] Other (See Comments)    Altered mental status for prolonged period   Nsaids Other (See Comments)    Hx of gastic bypass - told to avoid.   Phenergan [Promethazine Hcl] Other (See Comments)    Triggers asthma attacks   Sulfa Antibiotics Other (See Comments)    Yeast infection   Zohydro Er [Hydrocodone  Bitartrate Er] Other (See Comments)    Over sedation with IR and ER formulations   Latex Rash and Other (See Comments)    Skin blisters   Tape Rash and Other (See Comments)    Do not use adhesive tape; okay to use paper tape. 08/06/22 tolerated adhesive tape for peripheral IV    LABORATORY DATA:  I have reviewed the labs as listed.  CBC    Component Value Date/Time   WBC 3.3 (L) 07/24/2024 1243   RBC 3.64 (L) 07/24/2024 1243   HGB 12.7 07/24/2024 1243   HCT 37.7 07/24/2024 1243   PLT 146 (L) 07/24/2024 1243   MCV 103.6 (H) 07/24/2024 1243   MCH 34.9 (H) 07/24/2024 1243   MCHC 33.7 07/24/2024 1243   RDW 14.6 07/24/2024 1243   LYMPHSABS 1.5 07/24/2024 1243   MONOABS 0.2 07/24/2024 1243   EOSABS 0.1 07/24/2024 1243   BASOSABS 0.0 07/24/2024 1243      Latest Ref Rng & Units 07/24/2024   12:43 PM 06/28/2024    4:38 AM 06/25/2024    5:20 AM  CMP  Glucose 70 - 99 mg/dL 81  78  87   BUN 8 - 23 mg/dL 14  28  16    Creatinine 0.44 - 1.00 mg/dL 9.06  9.07  9.16   Sodium 135 - 145 mmol/L 139  137  140   Potassium 3.5 - 5.1 mmol/L 4.7  4.5  4.3   Chloride 98 - 111 mmol/L 109  108  110   CO2 22 - 32 mmol/L 20  24  22    Calcium  8.9 - 10.3 mg/dL 8.4  8.0  7.9   Total Protein 6.5 - 8.1 g/dL 6.9     Total Bilirubin 0.0 - 1.2 mg/dL 1.3     Alkaline Phos 38 - 126 U/L 159     AST 15 - 41 U/L 43     ALT 0 - 44 U/L 27       DIAGNOSTIC IMAGING:  I have independently reviewed the relevant imaging and discussed with the patient.   WRAP UP:  All questions were answered. The patient knows to call the clinic with  any problems, questions or concerns.  Medical decision making: High (extensive time spent discussing new diagnosis of CLL, prognosis, and plan)  Time spent  on visit: I spent 35 minutes counseling the patient face to face. The total time spent in the appointment was 50 minutes and more than 50% was on counseling.  Pleasant CHRISTELLA Barefoot, PA-C  08/02/24 3:34 PM

## 2024-08-02 ENCOUNTER — Inpatient Hospital Stay (HOSPITAL_BASED_OUTPATIENT_CLINIC_OR_DEPARTMENT_OTHER): Payer: Self-pay | Admitting: Physician Assistant

## 2024-08-02 VITALS — BP 103/70 | HR 71 | Temp 98.3°F | Resp 17 | Wt 130.9 lb

## 2024-08-02 DIAGNOSIS — I110 Hypertensive heart disease with heart failure: Secondary | ICD-10-CM | POA: Diagnosis not present

## 2024-08-02 DIAGNOSIS — K7460 Unspecified cirrhosis of liver: Secondary | ICD-10-CM | POA: Diagnosis not present

## 2024-08-02 DIAGNOSIS — E538 Deficiency of other specified B group vitamins: Secondary | ICD-10-CM

## 2024-08-02 DIAGNOSIS — D61818 Other pancytopenia: Secondary | ICD-10-CM

## 2024-08-02 DIAGNOSIS — Z5986 Financial insecurity: Secondary | ICD-10-CM | POA: Diagnosis not present

## 2024-08-02 DIAGNOSIS — E639 Nutritional deficiency, unspecified: Secondary | ICD-10-CM | POA: Diagnosis not present

## 2024-08-02 DIAGNOSIS — D696 Thrombocytopenia, unspecified: Secondary | ICD-10-CM | POA: Diagnosis not present

## 2024-08-02 DIAGNOSIS — Z885 Allergy status to narcotic agent status: Secondary | ICD-10-CM | POA: Diagnosis not present

## 2024-08-02 DIAGNOSIS — R5383 Other fatigue: Secondary | ICD-10-CM | POA: Diagnosis not present

## 2024-08-02 DIAGNOSIS — Z882 Allergy status to sulfonamides status: Secondary | ICD-10-CM | POA: Diagnosis not present

## 2024-08-02 DIAGNOSIS — R42 Dizziness and giddiness: Secondary | ICD-10-CM | POA: Diagnosis not present

## 2024-08-02 DIAGNOSIS — D72820 Lymphocytosis (symptomatic): Secondary | ICD-10-CM

## 2024-08-02 DIAGNOSIS — Z8673 Personal history of transient ischemic attack (TIA), and cerebral infarction without residual deficits: Secondary | ICD-10-CM | POA: Diagnosis not present

## 2024-08-02 DIAGNOSIS — E610 Copper deficiency: Secondary | ICD-10-CM | POA: Diagnosis not present

## 2024-08-02 DIAGNOSIS — R200 Anesthesia of skin: Secondary | ICD-10-CM | POA: Diagnosis not present

## 2024-08-02 DIAGNOSIS — K7581 Nonalcoholic steatohepatitis (NASH): Secondary | ICD-10-CM | POA: Diagnosis not present

## 2024-08-02 DIAGNOSIS — R1030 Lower abdominal pain, unspecified: Secondary | ICD-10-CM | POA: Diagnosis not present

## 2024-08-02 DIAGNOSIS — C9110 Chronic lymphocytic leukemia of B-cell type not having achieved remission: Secondary | ICD-10-CM | POA: Diagnosis not present

## 2024-08-02 DIAGNOSIS — Z8719 Personal history of other diseases of the digestive system: Secondary | ICD-10-CM | POA: Diagnosis not present

## 2024-08-02 DIAGNOSIS — I5032 Chronic diastolic (congestive) heart failure: Secondary | ICD-10-CM | POA: Diagnosis not present

## 2024-08-02 DIAGNOSIS — M549 Dorsalgia, unspecified: Secondary | ICD-10-CM | POA: Diagnosis not present

## 2024-08-02 DIAGNOSIS — R197 Diarrhea, unspecified: Secondary | ICD-10-CM | POA: Diagnosis not present

## 2024-08-02 DIAGNOSIS — Z79899 Other long term (current) drug therapy: Secondary | ICD-10-CM | POA: Diagnosis not present

## 2024-08-02 NOTE — Patient Instructions (Signed)
 [facility] at [facility] **VISIT SUMMARY & IMPORTANT INSTRUCTIONS **   You were seen today by Pleasant Barefoot PA-C for your low platelets and low white blood cells.    LOW PLATELETS Your low platelets are primarily related to your liver cirrhosis and enlarged spleen. They remain mildly low, but overall stable.    VITAMIN DEFICIENCIES Continue taking copper  supplement, but DECREASE this to take copper  2 mg EVERY OTHER DAY. Continue taking vitamin B12, but DECREASE this to take 1000 mcg EVERY OTHER DAY.  ABNORMAL WHITE BLOOD CELLS Your labs showed abnormal lymphocytes (a subtype of your white blood cells).  These white blood cells appear to be a type of cancerous or precancerous white blood cell. This may represent monoclonal B-cell lymphocytosis.  This is a precancerous condition in which your bone marrow makes too many copies of your lymphocyte white blood cells. This may also represent chronic lymphocytic leukemia (CLL).  CLL is a low-grade and slow-growing type of blood cancer.  Most patients with CLL only need to be monitored and may not require any treatment until many years later.  Treatment is usually given in the form of a pill. Please be on the look out for any symptoms such as severe unexplained fatigue, lymph node swelling or abnormal masses, unexplained fevers, or unintentional weight loss.  FOLLOW-UP APPOINTMENT: 3 months  ** Thank you for trusting me with your healthcare!  I strive to provide all of my patients with quality care at each visit.  If you receive a survey for this visit, I would be so grateful to you for taking the time to provide feedback.  Thank you in advance!  ~ Ysidra Sopher                                        Dr. Mickiel Davonna Pleasant Barefoot, PA-C          Delon Hope, NP   - - - - - - - - - - - - - - - - - -    Thank you for choosing [facility] at [facility] to provide your oncology and  hematology care.  To afford each patient quality time with our provider, please arrive at least 15 minutes before your scheduled appointment time.   If you have a lab appointment with the Cancer Center please come in thru the Main Entrance and check in at the main information desk.  You need to re-schedule your appointment should you arrive 10 or more minutes late.  We strive to give you quality time with our providers, and arriving late affects you and other patients whose appointments are after yours.  Also, if you no show three or more times for appointments you may be dismissed from the clinic at the providers discretion.     Again, thank you for choosing [facility].  Our hope is that these requests will decrease the amount of time that you wait before being seen by our physicians.       _____________________________________________________________  Should you have questions after your visit to [facility], please contact our office at [phone] and follow the prompts.  Our office hours are 8:00 a.m. and 4:30 p.m. Monday - Friday.  Please note that voicemails left after 4:00 p.m. may not be returned until  the following business day.  We are closed weekends and major holidays.  You do have access to a nurse 24-7, just call the main number to the clinic [phone] and do not press any options, hold on the line and a nurse will answer the phone.    For prescription refill requests, have your pharmacy contact our office and allow 72 hours.

## 2024-08-06 DIAGNOSIS — D62 Acute posthemorrhagic anemia: Secondary | ICD-10-CM | POA: Diagnosis not present

## 2024-08-06 DIAGNOSIS — J4489 Other specified chronic obstructive pulmonary disease: Secondary | ICD-10-CM | POA: Diagnosis not present

## 2024-08-06 DIAGNOSIS — D696 Thrombocytopenia, unspecified: Secondary | ICD-10-CM | POA: Diagnosis not present

## 2024-08-06 DIAGNOSIS — M48061 Spinal stenosis, lumbar region without neurogenic claudication: Secondary | ICD-10-CM | POA: Diagnosis not present

## 2024-08-06 DIAGNOSIS — H409 Unspecified glaucoma: Secondary | ICD-10-CM | POA: Diagnosis not present

## 2024-08-06 DIAGNOSIS — S22089D Unspecified fracture of T11-T12 vertebra, subsequent encounter for fracture with routine healing: Secondary | ICD-10-CM | POA: Diagnosis not present

## 2024-08-06 DIAGNOSIS — M791 Myalgia, unspecified site: Secondary | ICD-10-CM | POA: Diagnosis not present

## 2024-08-06 DIAGNOSIS — K589 Irritable bowel syndrome without diarrhea: Secondary | ICD-10-CM | POA: Diagnosis not present

## 2024-08-06 DIAGNOSIS — M5416 Radiculopathy, lumbar region: Secondary | ICD-10-CM | POA: Diagnosis not present

## 2024-08-06 DIAGNOSIS — S72002D Fracture of unspecified part of neck of left femur, subsequent encounter for closed fracture with routine healing: Secondary | ICD-10-CM | POA: Diagnosis not present

## 2024-08-06 DIAGNOSIS — K7581 Nonalcoholic steatohepatitis (NASH): Secondary | ICD-10-CM | POA: Diagnosis not present

## 2024-08-06 DIAGNOSIS — D709 Neutropenia, unspecified: Secondary | ICD-10-CM | POA: Diagnosis not present

## 2024-08-06 DIAGNOSIS — I2510 Atherosclerotic heart disease of native coronary artery without angina pectoris: Secondary | ICD-10-CM | POA: Diagnosis not present

## 2024-08-06 DIAGNOSIS — I110 Hypertensive heart disease with heart failure: Secondary | ICD-10-CM | POA: Diagnosis not present

## 2024-08-06 DIAGNOSIS — I272 Pulmonary hypertension, unspecified: Secondary | ICD-10-CM | POA: Diagnosis not present

## 2024-08-06 DIAGNOSIS — G8929 Other chronic pain: Secondary | ICD-10-CM | POA: Diagnosis not present

## 2024-08-06 DIAGNOSIS — G4700 Insomnia, unspecified: Secondary | ICD-10-CM | POA: Diagnosis not present

## 2024-08-06 DIAGNOSIS — E039 Hypothyroidism, unspecified: Secondary | ICD-10-CM | POA: Diagnosis not present

## 2024-08-06 DIAGNOSIS — E1140 Type 2 diabetes mellitus with diabetic neuropathy, unspecified: Secondary | ICD-10-CM | POA: Diagnosis not present

## 2024-08-06 DIAGNOSIS — M797 Fibromyalgia: Secondary | ICD-10-CM | POA: Diagnosis not present

## 2024-08-06 DIAGNOSIS — K219 Gastro-esophageal reflux disease without esophagitis: Secondary | ICD-10-CM | POA: Diagnosis not present

## 2024-08-06 DIAGNOSIS — I5032 Chronic diastolic (congestive) heart failure: Secondary | ICD-10-CM | POA: Diagnosis not present

## 2024-08-15 DIAGNOSIS — S72352D Displaced comminuted fracture of shaft of left femur, subsequent encounter for closed fracture with routine healing: Secondary | ICD-10-CM | POA: Diagnosis not present

## 2024-09-03 DIAGNOSIS — Z981 Arthrodesis status: Secondary | ICD-10-CM | POA: Diagnosis not present

## 2024-09-05 ENCOUNTER — Encounter (INDEPENDENT_AMBULATORY_CARE_PROVIDER_SITE_OTHER): Payer: Self-pay | Admitting: Gastroenterology

## 2024-09-05 DIAGNOSIS — S72352D Displaced comminuted fracture of shaft of left femur, subsequent encounter for closed fracture with routine healing: Secondary | ICD-10-CM | POA: Diagnosis not present

## 2024-09-11 NOTE — Progress Notes (Signed)
 Physical Medicine and Rehabilitation Consult Reason for Consult:polytrauma Referring Physician: Trauma service     HPI: Kimberly [name] is a 68 y.o. female with history of CAD and CHF, chronic low back pain, cirrhosis, asthma, fibromyalgia and COPD who presented on 06/08/2024 after a fall.  She apparently had started on gabapentin  and Ambien  recently and lost her balance while she was going to the bathroom overnight.The patient sustained a left proximal femoral shaft fracture with significant comminution which required an ORIF and IMN by Dr. Celena on 06/08/2024.  She is touchdown weightbearing on the left lower extremity.  She sustained a presacral hematoma with possible occult sacral fracture, conservative management recommended.  Patient sustained a T11-T12 Chance fracture and was seen by neurosurgery.  Patient underwent open reduction of T11-T12 fracture and T11-L1 posterior percutaneous instrumentation by Dr. Debby on 06/08/2024 as well.  CT of the cervical spine was negative for fracture.  MRI demonstrated degenerative disc disease at C6-C7 with mild to moderate central and foraminal stenosis, conservative care was recommended.  Patient with multiple other areas of pain but these areas were negative for fracture.  Foley was removed on 06/09/2024.  Patient was up with therapies over the weekend and was min assist for sit to stand transfers and was able to pivot at the edge of the bed to the chair using a rolling walker.  Gait was not tested.  With Occupational Therapy patient was minimal to maximal assist depending upon the activity.  Patient was independent using a single-point cane prior to this admission.  She lives with her spouse in a 1 level house with one-step to enter.       Home: Home Living Family/patient expects to be discharged to:: Private residence Living Arrangements: Spouse/significant other Available Help at Discharge: Family, Available 24 hours/day Type of Home:  House Home Access: Stairs to enter Secretary/administrator of Steps: 1 Entrance Stairs-Rails: None Home Layout: One level Bathroom Shower/Tub: Engineer, manufacturing systems: Standard Home Equipment: Agricultural consultant (2 wheels), [employer] - single point (hemi walker)  Functional History: Prior Function Prior Level of Function : Independent/Modified Independent, Needs assist Mobility Comments: ambulates with SPC ADLs Comments: does not drive, recent assist with medication management due to difficulty reading labels Functional Status:  Mobility: Bed Mobility Overal bed mobility: Needs Assistance Bed Mobility: Rolling, Sidelying to Sit Rolling: +2 for physical assistance, +2 for safety/equipment, Max assist Sidelying to sit: Min assist, Used rails General bed mobility comments: heavy assist for rolling right with cues for hand placement, LE positioning. Once sidelying, min A to initiate rise progressing to CGA; intermittent assist to scoot L hip toward EOB Transfers Overall transfer level: Needs assistance Equipment used: Rolling walker (2 wheels) Transfers: Sit to/from Stand, Bed to chair/wheelchair/BSC Sit to Stand: Min assist, +2 safety/equipment (ICU bed) Bed to/from chair/wheelchair/BSC transfer type:: Step pivot Stand pivot transfers: Min assist, +2 safety/equipment General transfer comment: Cues initially for hand placement with pt needing min A for rise up from edge of ICU bed; pt then with good maintenance of LLE precautions maintaining NWB throughout and min A for balance white pivoting heel/toe toward R to chair with use of RW Ambulation/Gait General Gait Details: unable; used heel/toe/shimmy to advance RLE from bed to chair   ADL: ADL Overall ADL's : Needs assistance/impaired Eating/Feeding: Set up, Sitting Grooming: Set up, Sitting Upper Body Bathing: Set up, Sitting Lower Body Bathing: Maximal assistance, Sit to/from stand Upper Body Dressing : Maximal assistance,  Sitting Upper  Body Dressing Details (indicate cue type and reason): brace application Lower Body Dressing: Maximal assistance, Sit to/from stand Toilet Transfer: Minimal assistance, +2 for physical assistance, +2 for safety/equipment, Stand-pivot, Rolling walker (2 wheels) Toilet Transfer Details (indicate cue type and reason): simulated to chair Toileting- Clothing Manipulation and Hygiene: Moderate assistance, Sit to/from stand Functional mobility during ADLs: Minimal assistance, +2 for safety/equipment, +2 for physical assistance, Rolling walker (2 wheels)   Cognition: Cognition Orientation Level: Oriented X4 Cognition Arousal: Alert Behavior During Therapy: WFL for tasks assessed/performed     Review of Systems  Constitutional:  Positive for malaise/fatigue.  HENT: Negative.    Eyes: Negative.   Respiratory:  Positive for cough and shortness of breath.   Cardiovascular: Negative.   Gastrointestinal:  Positive for constipation and nausea.  Genitourinary: Negative.   Musculoskeletal:  Positive for back pain, falls, joint pain and myalgias.  Skin:  Negative for itching.  Neurological:  Positive for sensory change, focal weakness and weakness.  Psychiatric/Behavioral:  Negative for depression.        Past Medical History:  Diagnosis Date   Anemia      PMH: as a child   Arthritis     Asthma     CAD (coronary artery disease)      nonobstructive by cath, 6/08 (false positive Cardiolite ) normal stress echo, 7/11   CHF (congestive heart failure) (HCC)     Chronic back pain     Chronic bronchitis (HCC)     Cirrhosis (HCC)     Complication of anesthesia      had an asthma attack when woke up from procedure   Complication of anesthesia      asthma attack with one surgery   COPD (chronic obstructive pulmonary disease) (HCC)     Degenerative joint disease     Depression     DJD (degenerative joint disease)     Fatty liver     Fibromyalgia     GERD (gastroesophageal reflux  disease)     Glaucoma     Headache(784.0)     Heart failure, diastolic, chronic (HCC)     Heart murmur      PMH:As a child only   Hernia of abdominal cavity      upper and lower hernia   History of hiatal hernia     HTN (hypertension)     Hypertension     Hypothyroidism     IBS (irritable bowel syndrome)     Morbid obesity (HCC)     Neuropathy      associated with diabetes   Obstructive sleep apnea     Pancreatitis     Pinched nerve in neck     Pneumonia      as a child   PONV (postoperative nausea and vomiting)     Pulmonary hypertension (HCC)     Rheumatic fever      PMH: as a child   Seizures (HCC)      PMH: only as a child   Seizures (HCC)      in childhood   Shortness of breath     Sleep apnea     Spinal stenosis     Stroke (HCC)      12/09/2013             Past Surgical History:  Procedure Laterality Date   ABDOMINAL HYSTERECTOMY       ABDOMINAL HYSTERECTOMY        partial hysterectomy   APPENDECTOMY  BACK SURGERY       BACK SURGERY        disc surgery with complications and damage to right side   BARIATRIC SURGERY        DS in June 2020   BILATERAL KNEE ARTHROSCOPY   2000, 2009   BIOPSY   06/16/2021    Procedure: BIOPSY;  Surgeon: Eartha Flavors, Toribio, MD;  Location: [facility];  Service: Gastroenterology;;   BREAST CYST EXCISION Left     CARDIAC CATHETERIZATION        2009   CATARACT EXTRACTION W/ INTRAOCULAR LENS  IMPLANT, BILATERAL       CHOLECYSTECTOMY   11/22/1992   CHOLECYSTECTOMY       COLONOSCOPY N/A 08/01/2013    Procedure: COLONOSCOPY;  Surgeon: Claudis RAYMOND Rivet, MD;  Location: [facility];  Service: Endoscopy;  Laterality: N/A;  930   COLONOSCOPY N/A 08/03/2017    Procedure: COLONOSCOPY;  Surgeon: Rivet Claudis RAYMOND, MD;  Location: [facility];  Service: Endoscopy;  Laterality: N/A;  12:00   COLONOSCOPY WITH PROPOFOL  N/A 06/28/2023    Procedure: COLONOSCOPY WITH PROPOFOL ;  Surgeon: Eartha Flavors Toribio, MD;  Location:  [facility];  Service: Gastroenterology;  Laterality: N/A;  12:30pm;asa 3   CYST EXCISION        Left breast   ESOPHAGOGASTRODUODENOSCOPY (EGD) WITH PROPOFOL  N/A 06/16/2021    Procedure: ESOPHAGOGASTRODUODENOSCOPY (EGD) WITH PROPOFOL ;  Surgeon: Eartha Flavors Toribio, MD;  Location: [facility];  Service: Gastroenterology;  Laterality: N/A;  12:50   ESOPHAGOGASTRODUODENOSCOPY (EGD) WITH PROPOFOL  N/A 06/28/2023    Procedure: ESOPHAGOGASTRODUODENOSCOPY (EGD) WITH PROPOFOL ;  Surgeon: Eartha Flavors Toribio, MD;  Location: [facility];  Service: Gastroenterology;  Laterality: N/A;  12:30pm;asa 3   EXCISIONAL HEMORRHOIDECTOMY       EYE SURGERY   11/22/1965   EYE SURGERY        bilateral cataract removal with lens implants   EYE SURGERY        growth removed from right eye lid   EYE SURGERY        bilateral eye surgery age 74 tto straighten eyes'   heel (other)   11/23/2003   HEEL SPUR SURGERY        Right heel - growth removal and rebuilding of heel   HEMORRHOID SURGERY       HERNIA REPAIR       KNEE ARTHROSCOPY Bilateral     LUMBAR LAMINECTOMY/DECOMPRESSION MICRODISCECTOMY Right 08/23/2014    Procedure: LUMBAR LAMINECTOMY/DECOMPRESSION MICRODISCECTOMY 1 LEVEL;  Surgeon: Victory DELENA Gunnels, MD;  Location: [facility];  Service: Neurosurgery;  Laterality: Right;  LUMBAR LAMINECTOMY/DECOMPRESSION MICRODISCECTOMY 1 LEVEL LUMBAR 2-3   ovaries removed   11/22/2001   PARTIAL HYSTERECTOMY   11/23/1979   SHOULDER ARTHROSCOPY WITH DISTAL CLAVICLE RESECTION Left 08/06/2022    Procedure: SHOULDER ARTHROSCOPY WITH DISTAL CLAVICLE RESECTION;  Surgeon: Sharl Selinda Dover, MD;  Location[facility];  Service: Orthopedics;  Laterality: Left;   SHOULDER ARTHROSCOPY WITH ROTATOR CUFF REPAIR Left 08/06/2022    Procedure: SHOULDER ARTHROSCOPY WITH ROTATOR CUFF REPAIR;  Surgeon: Sharl Selinda Dover, MD;  Location: [facility] OR;  Service: Orthopedics;  Laterality: Left;   SHOULDER ARTHROSCOPY WITH SUBACROMIAL  DECOMPRESSION Left 08/06/2022    Procedure: SHOULDER ARTHROSCOPY WITH SUBACROMIAL DECOMPRESSIONWITH EXTENSIVE DEBRIDEMENT;  Surgeon: Sharl Selinda Dover, MD;  Location: [facility] OR;  Service: Orthopedics;  Laterality: Left;   SPHINCTEROTOMY       spinahatomy   11/22/1990   TOTAL KNEE ARTHROPLASTY  Right 11/05/2020    Procedure: RIGHT TOTAL KNEE ARTHROPLASTY;  Surgeon: Barbarann Oneil BROCKS, MD;  Location: [facility];  Service: Orthopedics;  Laterality: Right;   TUBAL LIGATION   11/22/1973   TUBAL LIGATION                 Family History  Problem Relation Age of Onset   Colon cancer Brother     Cancer Other     Heart failure Other     Breast cancer Neg Hx          Social History:  reports that she has never smoked. She has never been exposed to tobacco smoke. She has never used smokeless tobacco. She reports that she does not drink alcohol  and does not use drugs. Allergies:  Allergies       Allergies  Allergen Reactions   Aleve [Naproxen] Anaphylaxis, Swelling and Other (See Comments)      Throat closes   Cinnamon Anaphylaxis      Reaction caused by a medication interaction.   Other Shortness Of Breath and Other (See Comments)      Local anesthesia - triggers asthma attacks   08/06/22 tolerated amide local anesthetic in peripheral nerve block.   Roxicodone  [Oxycodone ] Other (See Comments)      Over sedation   Atrovent Nasal Spray [Ipratropium] Other (See Comments)      Epistaxis    Feldene [Piroxicam] Nausea Only   Flonase [Fluticasone ] Other (See Comments)      Epistaxis    Lyrica [Pregabalin] Other (See Comments)      Altered mental status for prolonged period   Nsaids Other (See Comments)      Hx of gastic bypass - told to avoid.   Phenergan [Promethazine Hcl] Other (See Comments)      Triggers asthma attacks   Sulfa Antibiotics Other (See Comments)      Yeast infection   Zohydro Er [Hydrocodone  Bitartrate Er] Other (See Comments)      Over sedation with IR and ER formulations    Latex Rash and Other (See Comments)      Skin blisters   Tape Rash and Other (See Comments)      Do not use adhesive tape; okay to use paper tape. 08/06/22 tolerated adhesive tape for peripheral IV            Medications Prior to Admission  Medication Sig Dispense Refill   acetaminophen  (TYLENOL ) 500 MG tablet Take 500 mg by mouth 2 (two) times daily as needed for moderate pain (pain score 4-6), fever or headache.       albuterol  (VENTOLIN  HFA) 108 (90 Base) MCG/ACT inhaler Inhale 2 puffs into the lungs 4 (four) times daily as needed for shortness of breath.       Cholecalciferol  (VITAMIN D -3 PO) Take 1 capsule by mouth daily.       Copper  Gluconate 2 MG TABS Take 2 mg by mouth daily with supper.       cyclobenzaprine  (FLEXERIL ) 10 MG tablet Take 10 mg by mouth every 8 (eight) hours as needed for muscle spasms.       denosumab  (PROLIA ) 60 MG/ML SOSY injection Inject 60 mg into the skin every 6 (six) months.       famotidine  (PEPCID ) 40 MG tablet Take 40 mg by mouth daily.       furosemide  (LASIX ) 20 MG tablet Take 20 mg by mouth daily.       gabapentin  (NEURONTIN ) 100 MG capsule Take 100  mg by mouth 2 (two) times daily with a meal.       levothyroxine  (SYNTHROID ) 125 MCG tablet Take 125 mcg by mouth daily before breakfast.       Multiple Vitamins-Minerals (MULTIVITAMIN WOMEN 50+) TABS Take 1 tablet by mouth daily.       omeprazole  (PRILOSEC) 40 MG capsule Take 1 capsule (40 mg total) by mouth daily. 90 capsule 3   ondansetron  (ZOFRAN ) 4 MG tablet TAKE 1 TABLET BY MOUTH EVERY 8 HOURS AS NEEDED FOR NAUSEA OR VOMITING 30 tablet 1   spironolactone  (ALDACTONE ) 25 MG tablet Take 25 mg by mouth daily.       TRELEGY ELLIPTA 100-62.5-25 MCG/ACT AEPB Inhale 1 puff into the lungs every other day.       triamcinolone  cream (KENALOG ) 0.1 % Apply 1 Application topically 2 (two) times daily as needed (skin irritation).       zolpidem  (AMBIEN ) 5 MG tablet Take 5 mg by mouth at bedtime.                 Blood pressure 130/76, pulse 72, temperature 98 F (36.7 C), temperature source Oral, resp. rate 18, height 5' (1.524 m), weight 63.3 kg, SpO2 100%. Physical Exam Constitutional:      General: She is not in acute distress.    Appearance: She is not ill-appearing.  HENT:     Head: Normocephalic.     Right Ear: External ear normal.     Left Ear: External ear normal.     Nose: Nose normal. Congestion: O2 [place].  Eyes:     Pupils: Pupils are equal, round, and reactive to light.  Cardiovascular:     Rate and Rhythm: Normal rate and regular rhythm.  Pulmonary:     Effort: Pulmonary effort is normal.  Abdominal:     General: There is no distension.     Palpations: Abdomen is soft.  Musculoskeletal:        General: Swelling (LLE) and tenderness present. Normal range of motion.     Cervical back: Normal range of motion.  Skin:    General: Skin is warm.     Comments: Left hip incision and low back incision dressed  Neurological:     Mental Status: She is alert.     Comments: Alert and oriented x 3. Normal insight and awareness. Intact Memory. Normal language and speech. Cranial nerve exam unremarkable. MMT: BUE 4 to 4+/5 prox to distal. RLE 3/5 HF, KE and 5/5 ADF/PF. LLE: 1/5 HF, KE and 4/5 ADF/PF. Sensation diminished to LT in both legs R>L. Sl loss of LT in median side of both hands.  DTR's 1+, toes down. Kimberly [name]    Psychiatric:        Mood and Affect: Mood normal.        Behavior: Behavior normal.       Lab Results Last 24 Hours       Results for orders placed or performed during the hospital encounter of 06/08/24 (from the past 24 hours)  Glucose, capillary     Status: None    Collection Time: 06/11/24  4:02 AM  Result Value Ref Range    Glucose-Capillary 98 70 - 99 mg/dL  Glucose, capillary     Status: Abnormal    Collection Time: 06/11/24  8:01 AM  Result Value Ref Range    Glucose-Capillary 105 (H) 70 - 99 mg/dL      Imaging Results (Last 48 hours)  No results found.  Assessment/Plan: Diagnosis: 68 year old female status post fall with comminuted left femoral shaft fracture requiring nailing and ORIF as well as T11-T12 chance fracture requiring open reduction and fusion Does the need for close, 24 hr/day medical supervision in concert with the patient's rehab needs make it unreasonable for this patient to be served in a less intensive setting? Yes Co-Morbidities requiring supervision/potential complications:  - Acute on chronic pain and fibromyalgia -History of duodenal switch -Cirrhosis -History of sleep apnea -Hx of chronic polyradiculopathy with LE weakness and sensory loss -Hx of bilateral CTS with sensory loss -Neurogenic bowel   Due to bladder management, bowel management, safety, skin/wound care, disease management, medication administration, pain management, and patient education, does the patient require 24 hr/day rehab nursing? Yes Does the patient require coordinated care of a physician, rehab nurse, therapy disciplines of PT, OT to address physical and functional deficits in the context of the above medical diagnosis(es)? Yes Addressing deficits in the following areas: balance, endurance, locomotion, strength, transferring, bowel/bladder control, bathing, dressing, feeding, grooming, toileting, and psychosocial support Can the patient actively participate in an intensive therapy program of at least 3 hrs of therapy per day at least 5 days per week? Yes The potential for patient to make measurable gains while on inpatient rehab is excellent Anticipated functional outcomes upon discharge from inpatient rehab are supervision and min assist  with PT, supervision and min assist with OT, n/a with SLP. Estimated rehab length of stay to reach the above functional goals is: 13-16 days Anticipated discharge destination: Home Overall Rehab/Functional Prognosis: excellent   POST ACUTE RECOMMENDATIONS: This patient's condition is appropriate for continued  rehabilitative care in the following setting: CIR Patient has agreed to participate in recommended program. Yes Note that insurance prior authorization may be required for reimbursement for recommended care.   Comment: Pt was modified independent prior to this fall. She was dealing with lower extremity weakness and sensory loss leading up to this fall which has only worsened. Pt is very motivated and has support at home. Rehab Admissions Coordinator to follow up.       MEDICAL RECOMMENDATIONS: Lovenox  30mg  q12 started today. Probably worthwhile checking LE dopplers as well As a start, increase miralax  to bid and add senokot-s 2 tabs at bedtime for bowels. Try sorbitol 30-60 cc x 1 if the above doesn't provide results.      I have personally performed a face to face diagnostic evaluation of this patient. Additionally, I have examined the patient's medical record including any pertinent labs and radiographic images.     Thanks,   Arthea ONEIDA Gunther, MD 06/11/2024

## 2024-09-14 DIAGNOSIS — C9110 Chronic lymphocytic leukemia of B-cell type not having achieved remission: Secondary | ICD-10-CM | POA: Diagnosis not present

## 2024-09-14 DIAGNOSIS — J209 Acute bronchitis, unspecified: Secondary | ICD-10-CM | POA: Diagnosis not present

## 2024-09-14 DIAGNOSIS — B9689 Other specified bacterial agents as the cause of diseases classified elsewhere: Secondary | ICD-10-CM | POA: Diagnosis not present

## 2024-09-14 DIAGNOSIS — J329 Chronic sinusitis, unspecified: Secondary | ICD-10-CM | POA: Diagnosis not present

## 2024-09-14 DIAGNOSIS — Z6822 Body mass index (BMI) 22.0-22.9, adult: Secondary | ICD-10-CM | POA: Diagnosis not present

## 2024-09-20 ENCOUNTER — Encounter (INDEPENDENT_AMBULATORY_CARE_PROVIDER_SITE_OTHER): Payer: Self-pay | Admitting: Gastroenterology

## 2024-10-31 ENCOUNTER — Inpatient Hospital Stay: Attending: Oncology

## 2024-10-31 DIAGNOSIS — D638 Anemia in other chronic diseases classified elsewhere: Secondary | ICD-10-CM | POA: Diagnosis not present

## 2024-10-31 DIAGNOSIS — D61818 Other pancytopenia: Secondary | ICD-10-CM

## 2024-10-31 DIAGNOSIS — Z79899 Other long term (current) drug therapy: Secondary | ICD-10-CM | POA: Insufficient documentation

## 2024-10-31 DIAGNOSIS — D696 Thrombocytopenia, unspecified: Secondary | ICD-10-CM | POA: Diagnosis present

## 2024-10-31 DIAGNOSIS — C9110 Chronic lymphocytic leukemia of B-cell type not having achieved remission: Secondary | ICD-10-CM | POA: Insufficient documentation

## 2024-10-31 DIAGNOSIS — E610 Copper deficiency: Secondary | ICD-10-CM | POA: Insufficient documentation

## 2024-10-31 DIAGNOSIS — E538 Deficiency of other specified B group vitamins: Secondary | ICD-10-CM

## 2024-10-31 LAB — COMPREHENSIVE METABOLIC PANEL WITH GFR
ALT: 52 U/L — ABNORMAL HIGH (ref 0–44)
AST: 85 U/L — ABNORMAL HIGH (ref 15–41)
Albumin: 4 g/dL (ref 3.5–5.0)
Alkaline Phosphatase: 115 U/L (ref 38–126)
Anion gap: 11 (ref 5–15)
BUN: 16 mg/dL (ref 8–23)
CO2: 19 mmol/L — ABNORMAL LOW (ref 22–32)
Calcium: 9.2 mg/dL (ref 8.9–10.3)
Chloride: 113 mmol/L — ABNORMAL HIGH (ref 98–111)
Creatinine, Ser: 0.94 mg/dL (ref 0.44–1.00)
GFR, Estimated: >60 mL/min (ref >60)
Glucose, Bld: 80 mg/dL (ref 70–99)
Potassium: 4.1 mmol/L (ref 3.5–5.1)
Sodium: 143 mmol/L (ref 135–145)
Total Bilirubin: 1.2 mg/dL (ref 0.0–1.2)
Total Protein: 6.5 g/dL (ref 6.5–8.1)

## 2024-10-31 LAB — LACTATE DEHYDROGENASE: LDH: 194 U/L (ref 105–235)

## 2024-10-31 LAB — CBC WITH DIFFERENTIAL/PLATELET
Abs Immature Granulocytes: 0.01 K/uL (ref 0.00–0.07)
Basophils Absolute: 0 K/uL (ref 0.0–0.1)
Basophils Relative: 1 %
Eosinophils Absolute: 0.1 K/uL (ref 0.0–0.5)
Eosinophils Relative: 3 %
HCT: 34.6 % — ABNORMAL LOW (ref 36.0–46.0)
Hemoglobin: 11.9 g/dL — ABNORMAL LOW (ref 12.0–15.0)
Immature Granulocytes: 0 %
Lymphocytes Relative: 52 %
Lymphs Abs: 1.6 K/uL (ref 0.7–4.0)
MCH: 35.2 pg — ABNORMAL HIGH (ref 26.0–34.0)
MCHC: 34.4 g/dL (ref 30.0–36.0)
MCV: 102.4 fL — ABNORMAL HIGH (ref 80.0–100.0)
Monocytes Absolute: 0.2 K/uL (ref 0.1–1.0)
Monocytes Relative: 8 %
Neutro Abs: 1.1 K/uL — ABNORMAL LOW (ref 1.7–7.7)
Neutrophils Relative %: 36 %
Platelets: 132 K/uL — ABNORMAL LOW (ref 150–400)
RBC: 3.38 MIL/uL — ABNORMAL LOW (ref 3.87–5.11)
RDW: 13.9 % (ref 11.5–15.5)
WBC: 3.1 K/uL — ABNORMAL LOW (ref 4.0–10.5)
nRBC: 0 % (ref 0.0–0.2)

## 2024-10-31 LAB — VITAMIN B12: Vitamin B-12: 603 pg/mL (ref 180–914)

## 2024-11-01 LAB — COPPER, SERUM: Copper: 62 ug/dL — ABNORMAL LOW (ref 80–158)

## 2024-11-05 LAB — METHYLMALONIC ACID, SERUM: Methylmalonic Acid, Quantitative: 204 nmol/L (ref 0–378)

## 2024-11-07 ENCOUNTER — Inpatient Hospital Stay: Admitting: Oncology

## 2024-11-07 VITALS — BP 120/74 | HR 70 | Temp 97.0°F | Resp 17 | Ht 60.5 in | Wt 125.5 lb

## 2024-11-07 DIAGNOSIS — E538 Deficiency of other specified B group vitamins: Secondary | ICD-10-CM

## 2024-11-07 DIAGNOSIS — E610 Copper deficiency: Secondary | ICD-10-CM

## 2024-11-07 DIAGNOSIS — D61818 Other pancytopenia: Secondary | ICD-10-CM

## 2024-11-07 DIAGNOSIS — D696 Thrombocytopenia, unspecified: Secondary | ICD-10-CM | POA: Diagnosis not present

## 2024-11-07 DIAGNOSIS — C9110 Chronic lymphocytic leukemia of B-cell type not having achieved remission: Secondary | ICD-10-CM

## 2024-11-07 DIAGNOSIS — D72820 Lymphocytosis (symptomatic): Secondary | ICD-10-CM

## 2024-11-07 NOTE — Patient Instructions (Signed)
 [facility] at [facility] Discharge Instructions   You were seen and examined today by Dr. Davonna.  She reviewed the results of your lab work which are normal/stable.   We will see you back in 3 months.   Return as scheduled.    Thank you for choosing [facility] at [facility] to provide your oncology and hematology care.  To afford each patient quality time with our provider, please arrive at least 15 minutes before your scheduled appointment time.   If you have a lab appointment with the Cancer Center please come in thru the Main Entrance and check in at the main information desk.  You need to re-schedule your appointment should you arrive 10 or more minutes late.  We strive to give you quality time with our providers, and arriving late affects you and other patients whose appointments are after yours.  Also, if you no show three or more times for appointments you may be dismissed from the clinic at the providers discretion.     Again, thank you for choosing [facility].  Our hope is that these requests will decrease the amount of time that you wait before being seen by our physicians.       _____________________________________________________________  Should you have questions after your visit to [facility], please contact our office at [phone] and follow the prompts.  Our office hours are 8:00 a.m. and 4:30 p.m. Monday - Friday.  Please note that voicemails left after 4:00 p.m. may not be returned until the following business day.  We are closed weekends and major holidays.  You do have access to a nurse 24-7, just call the main number to the clinic [phone] and do not press any options, hold on the line and a nurse will answer the phone.    For prescription refill requests, have your pharmacy contact our office and allow 72 hours.    Due to Covid, you will need to wear a mask upon entering the  hospital. If you do not have a mask, a mask will be given to you at the Main Entrance upon arrival. For doctor visits, patients may have 1 support person age 66 or older with them. For treatment visits, patients can not have anyone with them due to social distancing guidelines and our immunocompromised population.

## 2024-11-07 NOTE — Progress Notes (Signed)
 " [facility] at [facility]  HEMATOLOGY FOLLOW-UP VISIT  Trudy Vaughn FALCON, MD  REASON FOR FOLLOW-UP: Thrombocytopenia  ASSESSMENT & PLAN:  Patient is a 68 y.o. female following for CLL  Assessment and Plan Assessment & Plan Chronic lymphocytic leukemia Asymptomatic CLL with stable hemoglobin and platelet counts, not meeting treatment criteria.  - Scheduled follow-up in three months with repeat hemoglobin and platelet counts. - Planned initiation of oral therapy if hemoglobin drops below 10 g/dL or platelets fall below 100,000/L.  Liver cirrhosis Cirrhosis may contribute to fatigue, gastrointestinal symptoms, and anemia. No GI/liver specialist care established.  - Encouraged follow-up with GI/liver specialist for management of cirrhosis and associated symptoms, including diarrhea.  Copper  deficiency Low copper  level likely due to intermittent supplementation, contributing to symptoms and lab findings.  - Instructed her to resume daily copper  supplementation.  Anemia secondary to chronic disease Mild, stable anemia likely multifactorial due to CLL, liver cirrhosis, and copper  deficiency. No anemia-specific therapy needed beyond addressing underlying causes.  - Monitor hemoglobin levels. - Address underlying copper  deficiency and chronic disease.  Postoperative state after femur and spinal surgery Recovering from major orthopedic surgeries with expected fatigue and decreased strength.  - Continue supportive care for postoperative recovery and monitor strength.     Orders Placed This Encounter  Procedures   CBC with Differential    Standing Status:   Future    Expected Date:   01/29/2025    Expiration Date:   04/29/2025   Comprehensive metabolic panel    Standing Status:   Future    Expected Date:   01/29/2025    Expiration Date:   04/29/2025   Lactate dehydrogenase    Standing Status:   Future    Expected Date:   01/29/2025    Expiration Date:    04/29/2025   Copper , serum    Standing Status:   Future    Expected Date:   01/29/2025    Expiration Date:   04/29/2025   Iron and TIBC ([facility] ONLY)    Standing Status:   Future    Expected Date:   01/29/2025    Expiration Date:   04/29/2025   Ferritin    Standing Status:   Future    Expected Date:   01/29/2025    Expiration Date:   04/29/2025   Vitamin B12    Standing Status:   Future    Expected Date:   01/29/2025    Expiration Date:   04/29/2025   Folate    Standing Status:   Future    Expected Date:   01/29/2025    Expiration Date:   04/29/2025    The total time spent in the appointment was 24 minutes encounter with patients including review of chart and various tests results, discussions about plan of care and coordination of care plan   All questions were answered. The patient knows to call the clinic with any problems, questions or concerns. No barriers to learning was detected.  Mickiel Dry, MD 12/28/202510:38 AM    INTERVAL HISTORY: Discussed the use of AI scribe software for clinical note transcription with the patient, who gave verbal consent to proceed.  History of Present Illness Kimberly [name] is a 68 year old female with chronic lymphocytic leukemia who presents for hematology/oncology follow-up to monitor CLL status and cytopenias.  Laboratory studies show hemoglobin 11.9 g/dL and platelets 867 k89^0/O. She experiences occasional fever and chills, mild headaches, and infrequent, non-drenching night sweats. There  is no significant pain or drenching night sweats.  She reports significant fatigue and difficulty regaining strength following recent major orthopedic surgeries for femur and spinal fractures. She ambulates with difficulty and finds her symptoms disruptive to daily activities. She denies current pain but continues to experience tiredness and reduced physical capacity.  She has ongoing gastrointestinal symptoms, including  intermittent nausea, abdominal cramping, and diarrhea with urgency and occasional incontinence, particularly when out in public. She notes decreased appetite and sometimes feels sick when eating, leading her to stop meals early. She is unsure if these symptoms are related to her cirrhosis or recent surgery. She has not yet established care with a gastroenterologist.  She is taking copper  supplementation every other day, but her copper  level remains low. She previously discontinued vitamin B12 supplementation due to elevated levels per instructions from another provider.     I have reviewed the past medical history, past surgical history, social history and family history with the patient   ALLERGIES:  is allergic to aleve [naproxen], cinnamon, other, roxicodone  [oxycodone ], atrovent nasal spray [ipratropium], feldene [piroxicam], flonase [fluticasone ], lyrica [pregabalin], nsaids, phenergan [promethazine hcl], sulfa antibiotics, zohydro er [hydrocodone  bitartrate er], latex, and tape.  MEDICATIONS:  Current Outpatient Medications  Medication Sig Dispense Refill   furosemide  (LASIX ) 20 MG tablet Take 20 mg by mouth daily.     zolpidem  (AMBIEN ) 5 MG tablet      acetaminophen  (TYLENOL ) 500 MG tablet Take 500 mg by mouth 2 (two) times daily as needed for moderate pain (pain score 4-6), fever or headache.     albuterol  (VENTOLIN  HFA) 108 (90 Base) MCG/ACT inhaler Inhale 2 puffs into the lungs 4 (four) times daily as needed for shortness of breath.     B Complex-C (B-COMPLEX WITH VITAMIN C) tablet Take 1 tablet by mouth daily.     busPIRone  (BUSPAR ) 5 MG tablet Take 1 tablet (5 mg total) by mouth 3 (three) times daily. 90 tablet 0   Cholecalciferol  (VITAMIN D -3 PO) Take 1 capsule by mouth daily.     cyclobenzaprine  (FLEXERIL ) 5 MG tablet Take 5 mg by mouth 3 (three) times daily as needed.     famotidine  (PEPCID ) 40 MG tablet Take 1 tablet (40 mg total) by mouth daily. 30 tablet 0   levothyroxine   (SYNTHROID ) 125 MCG tablet Take 125 mcg by mouth daily before breakfast.     lidocaine  (LIDODERM ) 5 % Place 2 patches onto the skin daily. Remove & Discard patch within 12 hours or as directed by MD 60 patch 0   melatonin 5 MG TABS Take 1 tablet (5 mg total) by mouth at bedtime. 30 tablet 0   Multiple Vitamins-Minerals (MULTIVITAMIN WOMEN 50+) TABS Take 1 tablet by mouth daily.     omeprazole  (PRILOSEC) 40 MG capsule TAKE 1 CAPSULE BY MOUTH DAILY 90 capsule 1   ondansetron  (ZOFRAN ) 4 MG tablet TAKE 1 TABLET BY MOUTH EVERY 8 HOURS AS NEEDED FOR NAUSEA OR VOMITING 30 tablet 1   protein supplement (RESOURCE BENEPROTEIN) POWD Take 6 g by mouth 3 (three) times daily with meals.     spironolactone  (ALDACTONE ) 25 MG tablet Take 25 mg by mouth daily.     TRELEGY ELLIPTA 100-62.5-25 MCG/ACT AEPB Inhale 1 puff into the lungs every other day.     triamcinolone  cream (KENALOG ) 0.1 % Apply 1 Application topically 2 (two) times daily as needed (skin irritation).     Vitamin D , Ergocalciferol , (DRISDOL ) 1.25 MG (50000 UNIT) CAPS capsule Take 1 capsule (50,000  Units total) by mouth every 7 (seven) days. 3 capsule 0   Zinc  Sulfate 220 (50 Zn) MG TABS Take 1 tablet (220 mg total) by mouth daily with supper. 30 tablet 0   No current facility-administered medications for this visit.      PHYSICAL EXAMINATION:   Vitals:   11/07/24 1507  BP: 120/74  Pulse: 70  Resp: 17  Temp: (!) 97 F (36.1 C)  SpO2: 100%     GENERAL:alert, no distress and comfortable SKIN: skin color, texture, turgor are normal, no rashes or significant lesions, small bruits on the left arm 0.5 cm in diameter  LYMPH:  no palpable lymphadenopathy in the cervical, axillary or inguinal LUNGS: clear to auscultation and percussion with normal breathing effort HEART: regular rate & rhythm and no murmurs and no lower extremity edema ABDOMEN:abdomen soft, non-tender and normal bowel sounds  LABORATORY DATA:  I have reviewed the data as  listed  Lab Results  Component Value Date   WBC 3.1 (L) 10/31/2024   NEUTROABS 1.1 (L) 10/31/2024   HGB 11.9 (L) 10/31/2024   HCT 34.6 (L) 10/31/2024   MCV 102.4 (H) 10/31/2024   PLT 132 (L) 10/31/2024       Chemistry      Component Value Date/Time   NA 143 10/31/2024 1046   K 4.1 10/31/2024 1046   CL 113 (H) 10/31/2024 1046   CO2 19 (L) 10/31/2024 1046   BUN 16 10/31/2024 1046   CREATININE 0.94 10/31/2024 1046   CREATININE 1.07 (H) 04/09/2024 1439      Component Value Date/Time   CALCIUM  9.2 10/31/2024 1046   ALKPHOS 115 10/31/2024 1046   AST 85 (H) 10/31/2024 1046   ALT 52 (H) 10/31/2024 1046   BILITOT 1.2 10/31/2024 1046      Latest Reference Range & Units 07/24/24 12:42  Iron 28 - 170 ug/dL 90  UIBC ug/dL 721  TIBC 749 - 549 ug/dL 631  Saturation Ratios 10.4 - 31.8 % 25  Ferritin 11 - 307 ng/mL 188  Folate >5.9 ng/mL 14.8  (L): Data is abnormally low   Latest Reference Range & Units 10/31/24 10:46  Copper  80 - 158 ug/dL 62 (L)  Methylmalonic Acid, Quantitative 0 - 378 nmol/L 204  Vitamin B12 180 - 914 pg/mL 603  (L): Data is abnormally low   Latest Reference Range & Units 10/31/24 10:46  LDH 105 - 235 U/L 194   "

## 2024-11-19 ENCOUNTER — Encounter: Payer: Self-pay | Admitting: *Deleted

## 2025-02-06 ENCOUNTER — Inpatient Hospital Stay: Attending: Oncology

## 2025-02-13 ENCOUNTER — Inpatient Hospital Stay: Admitting: Physician Assistant

## 2025-04-01 ENCOUNTER — Ambulatory Visit: Admitting: Cardiology
